# Patient Record
Sex: Female | Born: 1960 | Race: White | Hispanic: No | Marital: Single | Smoking: Never smoker
Health system: Southern US, Community
[De-identification: ages and names within clinical notes are randomized; demographics above are authoritative.]

## PROBLEM LIST (undated history)

## (undated) ENCOUNTER — Inpatient Hospital Stay (HOSPITAL_COMMUNITY): Admission: RE | Payer: Medicare (Managed Care) | Source: Ambulatory Visit | Admitting: Plastic Surgery

## (undated) ENCOUNTER — Encounter (HOSPITAL_COMMUNITY): Admission: RE | Payer: Self-pay | Source: Ambulatory Visit

## (undated) DIAGNOSIS — K769 Liver disease, unspecified: Secondary | ICD-10-CM

## (undated) DIAGNOSIS — E039 Hypothyroidism, unspecified: Secondary | ICD-10-CM

## (undated) DIAGNOSIS — I10 Essential (primary) hypertension: Secondary | ICD-10-CM

## (undated) DIAGNOSIS — D259 Leiomyoma of uterus, unspecified: Secondary | ICD-10-CM

## (undated) DIAGNOSIS — I639 Cerebral infarction, unspecified: Secondary | ICD-10-CM

## (undated) DIAGNOSIS — D1771 Benign lipomatous neoplasm of kidney: Secondary | ICD-10-CM

## (undated) DIAGNOSIS — K589 Irritable bowel syndrome without diarrhea: Secondary | ICD-10-CM

## (undated) DIAGNOSIS — G40909 Epilepsy, unspecified, not intractable, without status epilepticus: Secondary | ICD-10-CM

## (undated) DIAGNOSIS — J45909 Unspecified asthma, uncomplicated: Secondary | ICD-10-CM

## (undated) DIAGNOSIS — G4700 Insomnia, unspecified: Secondary | ICD-10-CM

## (undated) DIAGNOSIS — Z9109 Other allergy status, other than to drugs and biological substances: Secondary | ICD-10-CM

## (undated) HISTORY — PX: OTHER SURGICAL HISTORY: SHX169

## (undated) HISTORY — PX: LIVER BIOPSY: SHX301

## (undated) HISTORY — DX: Irritable bowel syndrome without diarrhea: K58.9

## (undated) HISTORY — DX: Insomnia, unspecified: G47.00

## (undated) HISTORY — DX: Hypothyroidism, unspecified: E03.9

## (undated) HISTORY — DX: Unspecified asthma, uncomplicated: J45.909

## (undated) HISTORY — DX: Epilepsy, unspecified, not intractable, without status epilepticus (CMS HCC): G40.909

## (undated) HISTORY — DX: Benign lipomatous neoplasm of kidney: D17.71

## (undated) HISTORY — PX: HX HAND SURGERY: 2100001299

## (undated) HISTORY — DX: Other allergy status, other than to drugs and biological substances: Z91.09

## (undated) SURGERY — BLEPHAROPLASTY
Anesthesia: Monitor Anesthesia Care | Site: Eye | Laterality: Bilateral

---

## 1997-09-24 ENCOUNTER — Observation Stay (HOSPITAL_COMMUNITY): Payer: Self-pay | Admitting: EXTERNAL

## 1997-10-19 ENCOUNTER — Ambulatory Visit (HOSPITAL_COMMUNITY): Payer: Self-pay | Admitting: Family Medicine

## 1997-12-14 ENCOUNTER — Emergency Department (HOSPITAL_COMMUNITY): Admission: EM | Admit: 1997-12-14 | Discharge: 1997-12-14 | Payer: Self-pay | Admitting: Emergency Medicine

## 1998-01-15 ENCOUNTER — Emergency Department (HOSPITAL_COMMUNITY): Admission: EM | Admit: 1998-01-15 | Discharge: 1998-01-15 | Payer: Self-pay | Admitting: Emergency Medicine

## 1998-05-08 ENCOUNTER — Emergency Department (HOSPITAL_COMMUNITY): Admission: EM | Admit: 1998-05-08 | Discharge: 1998-05-08 | Payer: Self-pay | Admitting: Emergency Medicine

## 1998-05-29 ENCOUNTER — Emergency Department (HOSPITAL_COMMUNITY): Admission: EM | Admit: 1998-05-29 | Discharge: 1998-05-29 | Payer: Self-pay | Admitting: Emergency Medicine

## 1998-06-02 ENCOUNTER — Ambulatory Visit (HOSPITAL_BASED_OUTPATIENT_CLINIC_OR_DEPARTMENT_OTHER): Admission: RE | Admit: 1998-06-02 | Discharge: 1998-06-02 | Payer: Self-pay | Admitting: Orthopedic Surgery

## 1998-08-27 ENCOUNTER — Emergency Department (HOSPITAL_COMMUNITY): Admission: EM | Admit: 1998-08-27 | Discharge: 1998-08-27 | Payer: Self-pay | Admitting: Emergency Medicine

## 1998-09-08 ENCOUNTER — Encounter: Payer: Self-pay | Admitting: Emergency Medicine

## 1998-09-08 ENCOUNTER — Emergency Department (HOSPITAL_COMMUNITY): Admission: EM | Admit: 1998-09-08 | Discharge: 1998-09-08 | Payer: Self-pay | Admitting: Emergency Medicine

## 1998-11-05 ENCOUNTER — Emergency Department (HOSPITAL_COMMUNITY): Admission: EM | Admit: 1998-11-05 | Discharge: 1998-11-05 | Payer: Self-pay | Admitting: Emergency Medicine

## 1998-12-06 ENCOUNTER — Observation Stay (HOSPITAL_COMMUNITY): Admission: EM | Admit: 1998-12-06 | Discharge: 1998-12-06 | Payer: Self-pay | Admitting: Emergency Medicine

## 1999-03-05 ENCOUNTER — Emergency Department (HOSPITAL_COMMUNITY): Admission: EM | Admit: 1999-03-05 | Discharge: 1999-03-06 | Payer: Self-pay

## 1999-03-18 ENCOUNTER — Other Ambulatory Visit: Admission: RE | Admit: 1999-03-18 | Discharge: 1999-03-18 | Payer: Self-pay | Admitting: Obstetrics and Gynecology

## 2001-08-05 ENCOUNTER — Inpatient Hospital Stay (HOSPITAL_COMMUNITY): Admission: EM | Admit: 2001-08-05 | Discharge: 2001-08-08 | Payer: Self-pay | Admitting: Emergency Medicine

## 2001-08-05 ENCOUNTER — Encounter: Payer: Self-pay | Admitting: Internal Medicine

## 2001-08-07 ENCOUNTER — Encounter: Payer: Self-pay | Admitting: Internal Medicine

## 2001-10-03 ENCOUNTER — Emergency Department (HOSPITAL_COMMUNITY): Admission: EM | Admit: 2001-10-03 | Discharge: 2001-10-04 | Payer: Self-pay | Admitting: Emergency Medicine

## 2002-02-11 ENCOUNTER — Encounter: Payer: Self-pay | Admitting: Emergency Medicine

## 2002-02-11 ENCOUNTER — Inpatient Hospital Stay (HOSPITAL_COMMUNITY): Admission: EM | Admit: 2002-02-11 | Discharge: 2002-02-12 | Payer: Self-pay | Admitting: Emergency Medicine

## 2002-02-12 ENCOUNTER — Encounter: Payer: Self-pay | Admitting: Internal Medicine

## 2002-08-26 ENCOUNTER — Emergency Department (HOSPITAL_COMMUNITY): Admission: EM | Admit: 2002-08-26 | Discharge: 2002-08-26 | Payer: Self-pay | Admitting: Emergency Medicine

## 2003-05-19 ENCOUNTER — Emergency Department (HOSPITAL_COMMUNITY): Admission: EM | Admit: 2003-05-19 | Discharge: 2003-05-19 | Payer: Self-pay | Admitting: Emergency Medicine

## 2003-11-24 ENCOUNTER — Emergency Department (HOSPITAL_COMMUNITY): Admission: EM | Admit: 2003-11-24 | Discharge: 2003-11-24 | Payer: Self-pay | Admitting: Emergency Medicine

## 2003-11-24 IMAGING — CR DG SHOULDER 2+V*L*
4 series · 4 of 4 positions shown · non-contrast
Comparison: none

CLINICAL DATA: The patient fell in the shower and complains of left shoulder pain.
 LEFT SHOULDER, THREE VIEWS ? [DATE] 
 On two views, there appears to be a tiny avulsion from the lateral aspect of the acromion.  This is an unusual place for an avulsion fracture, but I think it is a real finding.  There is no other abnormality.

[view not recorded (1 of 4)]
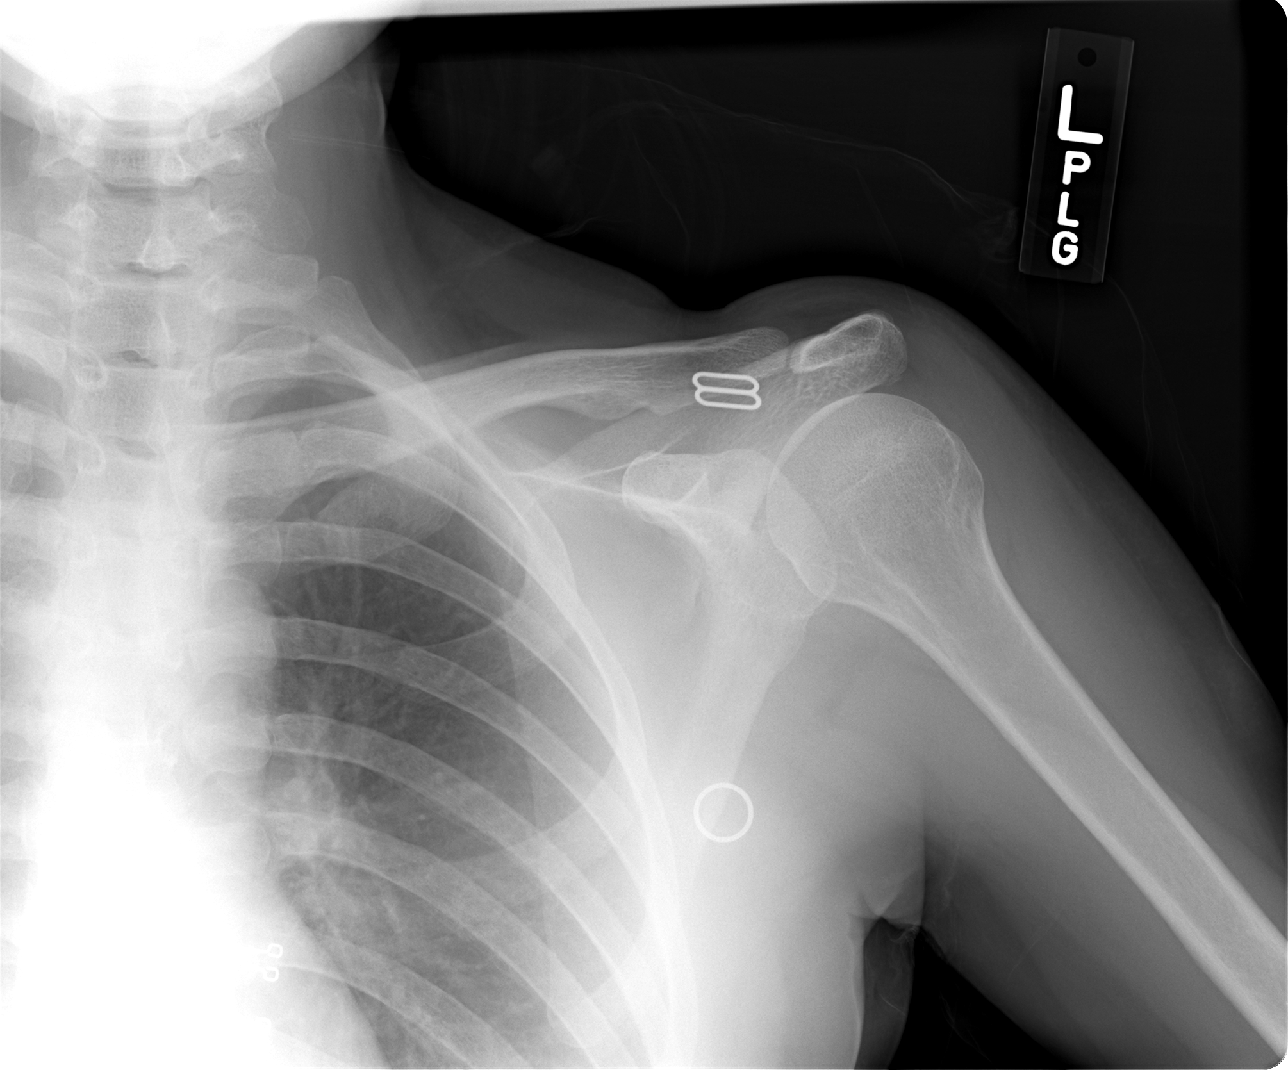

[view not recorded (2 of 4)]
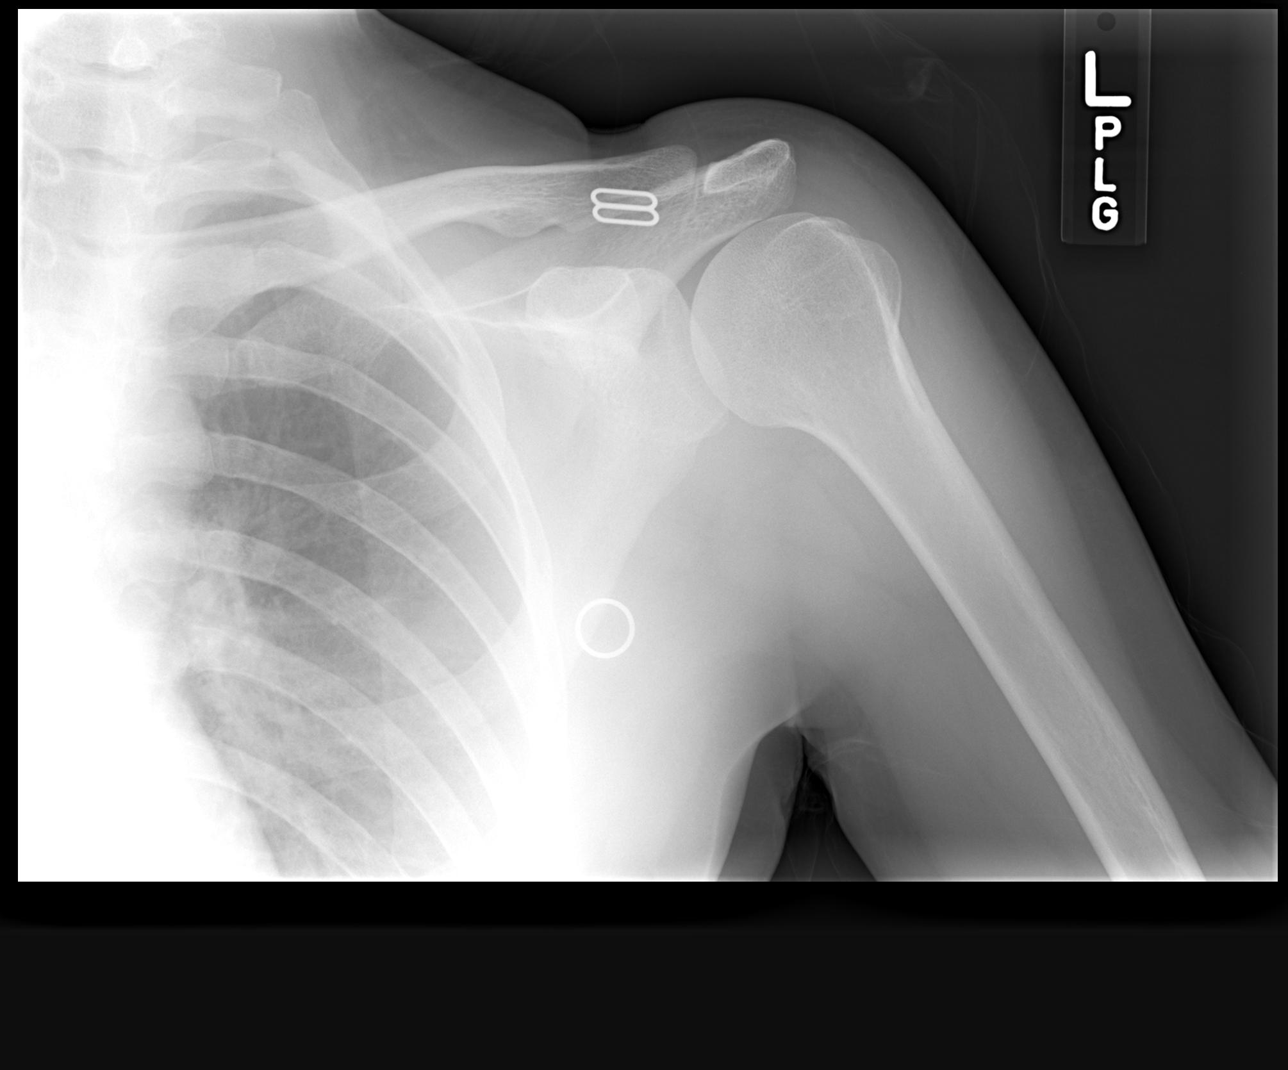

[view not recorded (3 of 4)]
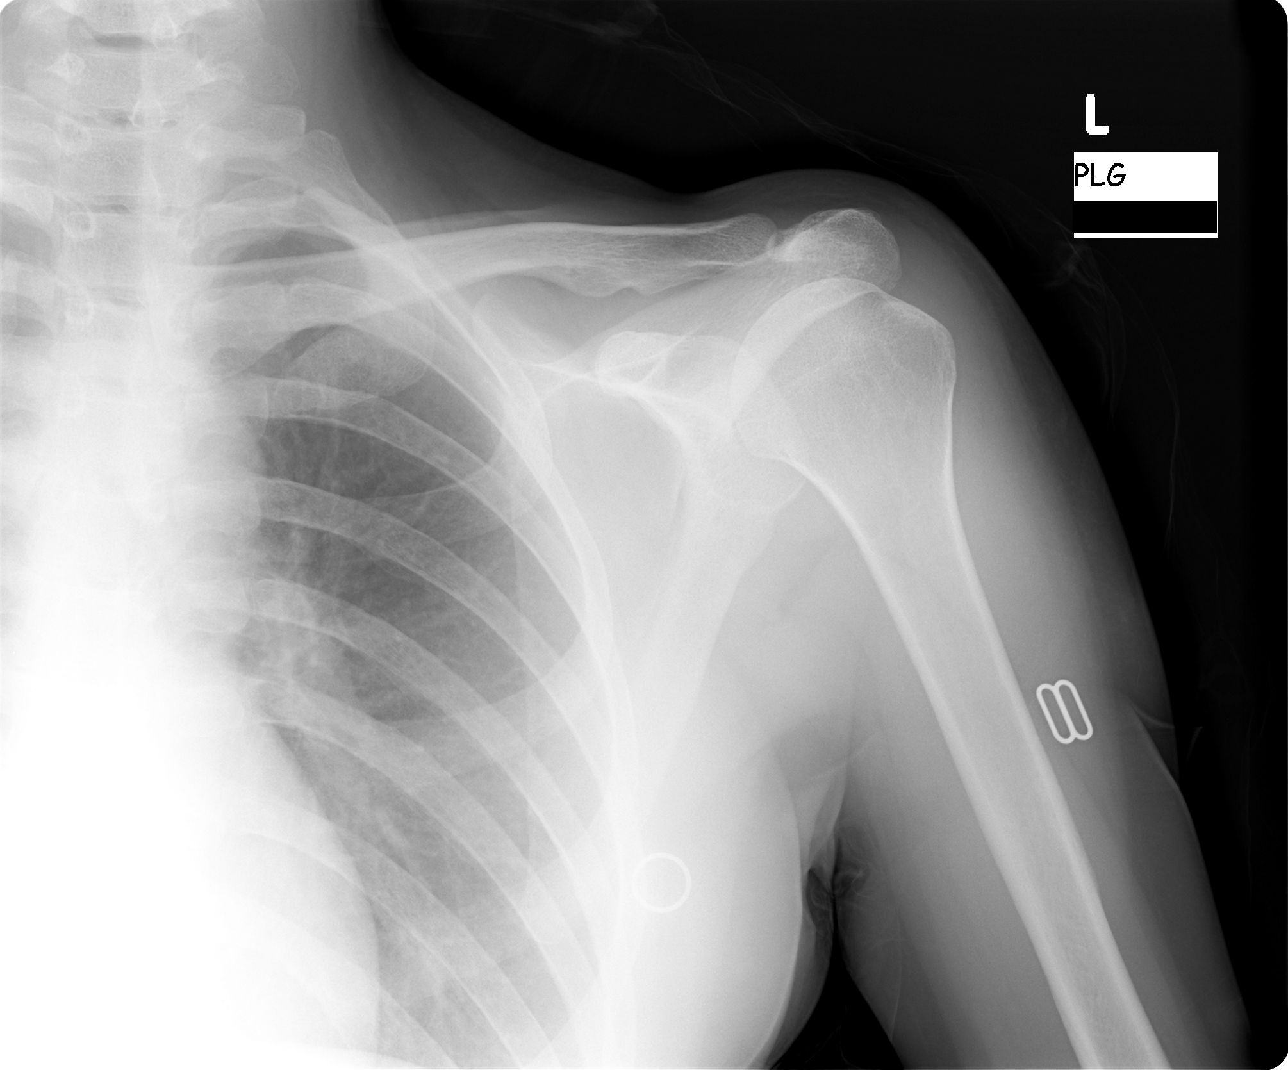

[view not recorded (4 of 4)]
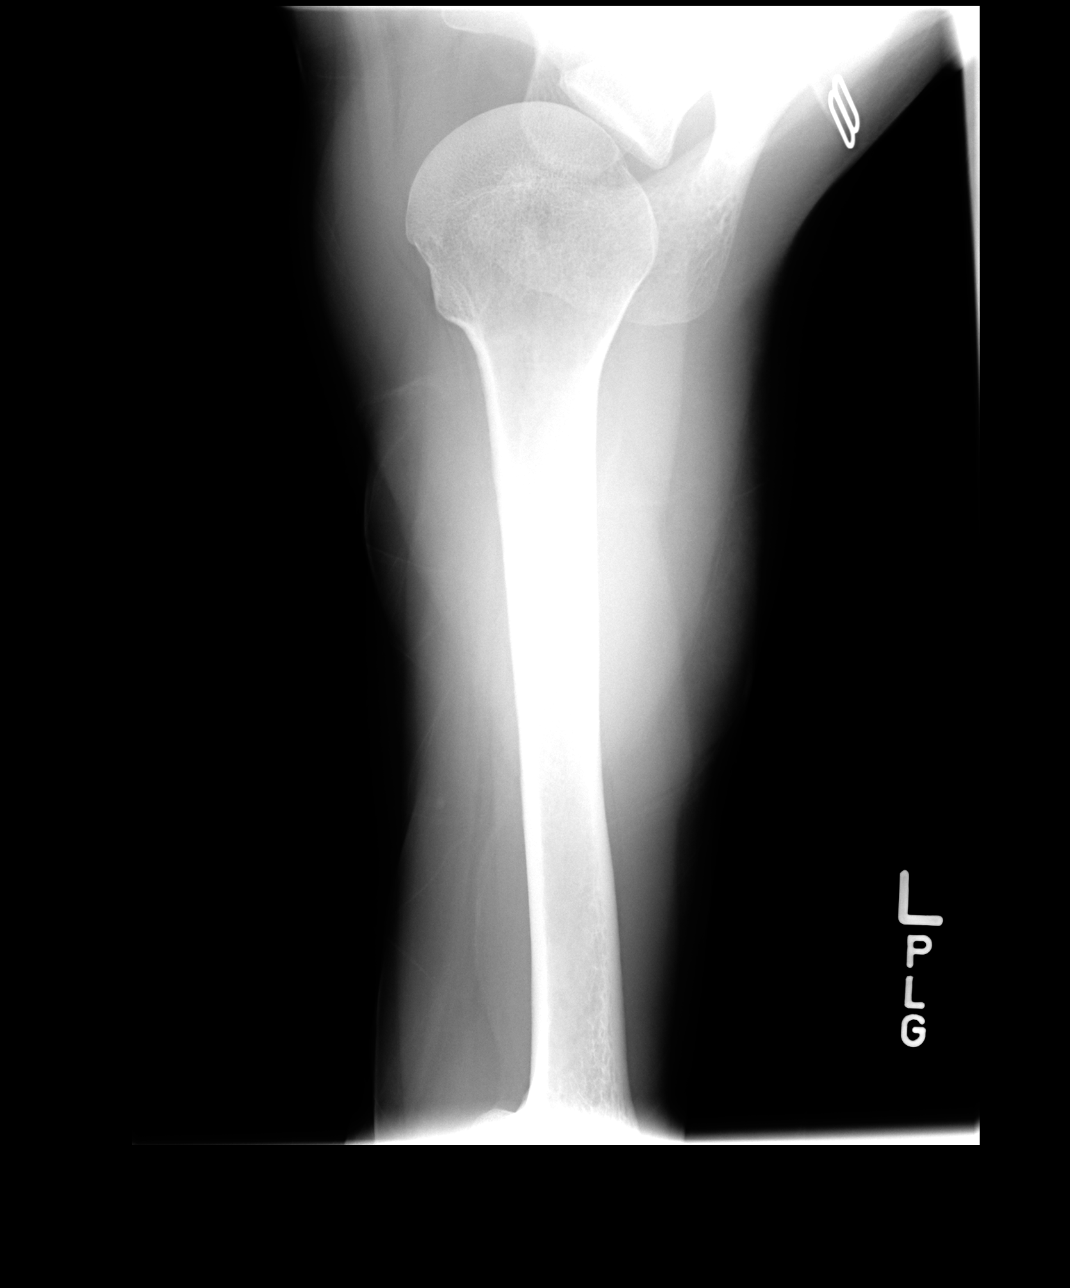

[4 of 4 positions shown; findings below may reference images not displayed]

IMPRESSION: Tiny avulsion from the lateral aspect of the acromion.

## 2003-11-24 IMAGING — CR DG FINGER THUMB 2+V*L*
1 series · 1 of 1 positions shown · non-contrast
Comparison: none

CLINICAL DATA: Pain after a fall.  
 LEFT THUMB (THREE VIEWS) [DATE] 
 There is no fracture or other acute abnormality.  
 IMPRESSION
 Normal thumb.

[view not recorded]
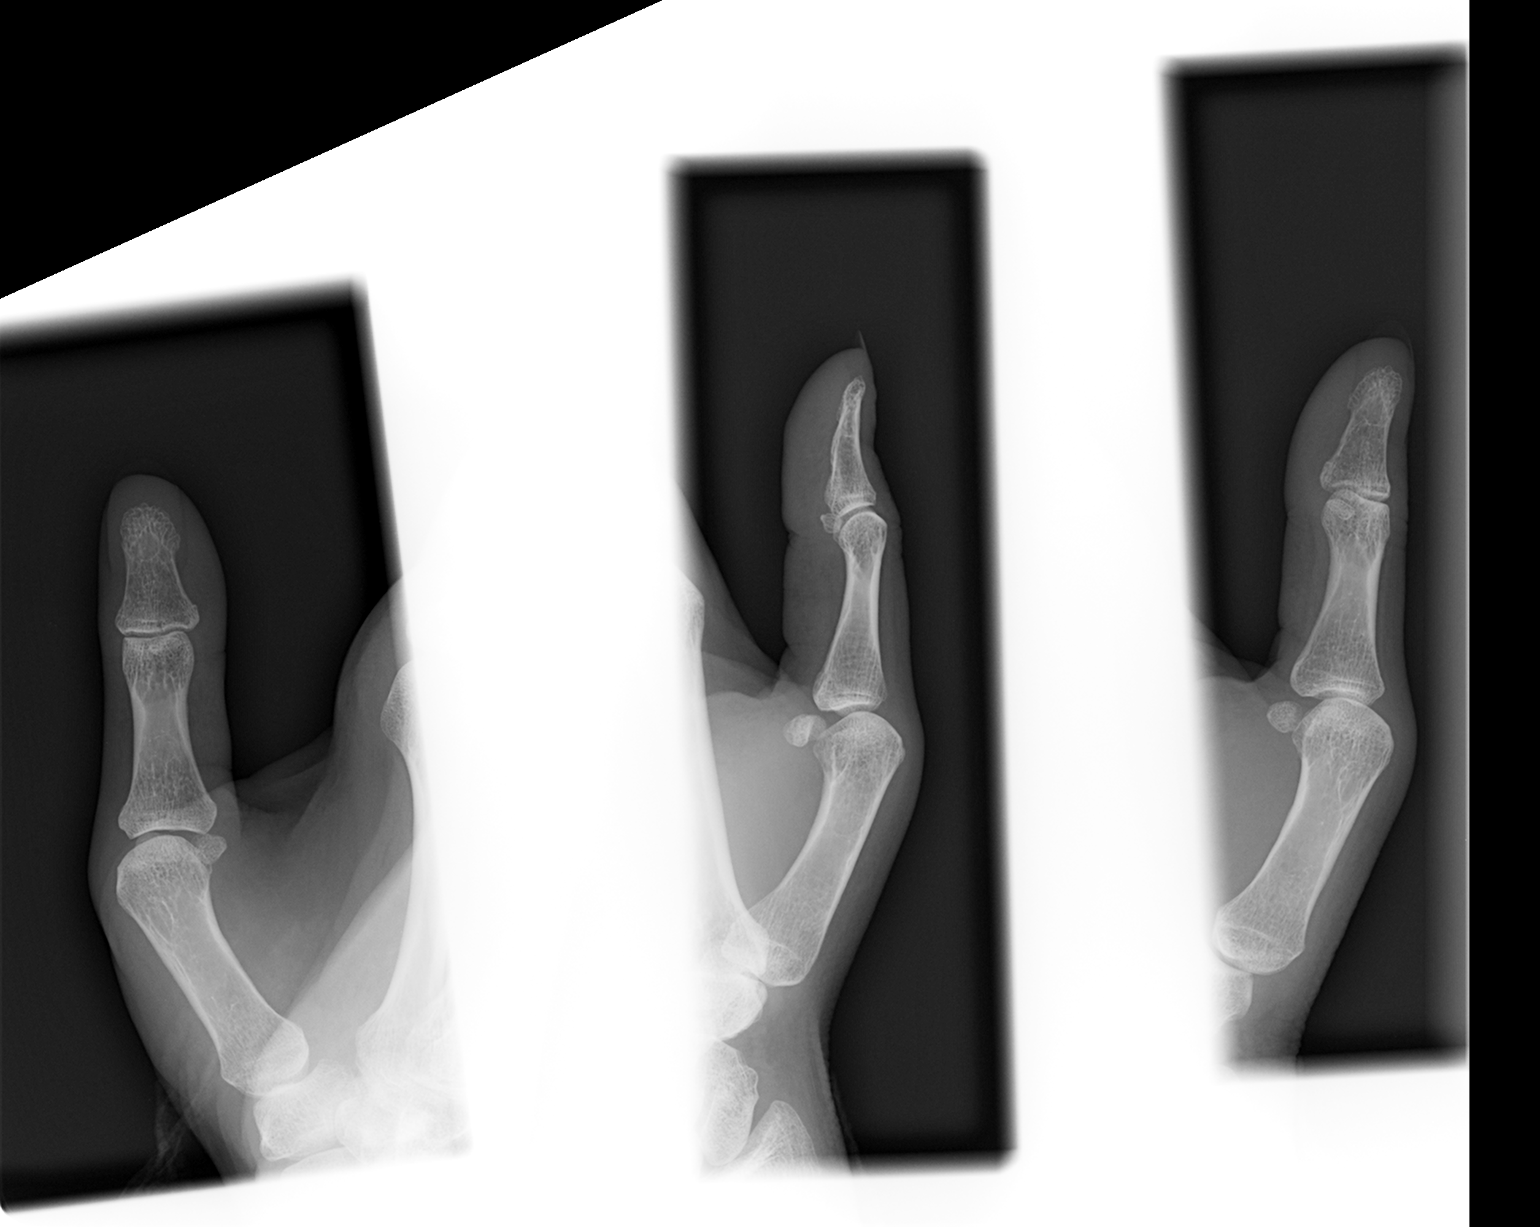

[1 of 1 positions shown; findings below may reference images not displayed]

## 2004-06-29 ENCOUNTER — Emergency Department (HOSPITAL_COMMUNITY): Admission: EM | Admit: 2004-06-29 | Discharge: 2004-06-29 | Payer: Self-pay | Admitting: Emergency Medicine
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
## 2004-09-17 ENCOUNTER — Emergency Department (HOSPITAL_COMMUNITY): Admission: EM | Admit: 2004-09-17 | Discharge: 2004-09-17 | Payer: Self-pay | Admitting: Family Medicine

## 2005-01-28 ENCOUNTER — Inpatient Hospital Stay (HOSPITAL_COMMUNITY): Admission: EM | Admit: 2005-01-28 | Discharge: 2005-01-31 | Payer: Self-pay | Admitting: Emergency Medicine

## 2005-01-28 IMAGING — CR DG CHEST 2V
2 series · 2 of 2 positions shown · non-contrast
Comparison: none

CLINICAL DATA: Chest pain. 
 CHEST - 2 VIEW: 
 No comparison films available. 
 Cardiomediastinal silhouette is unremarkable.  The lungs are clear.  No evidence of pleural effusion or pneumothorax.  The upper abdomen and bony thorax are unremarkable.

[w chest lat]
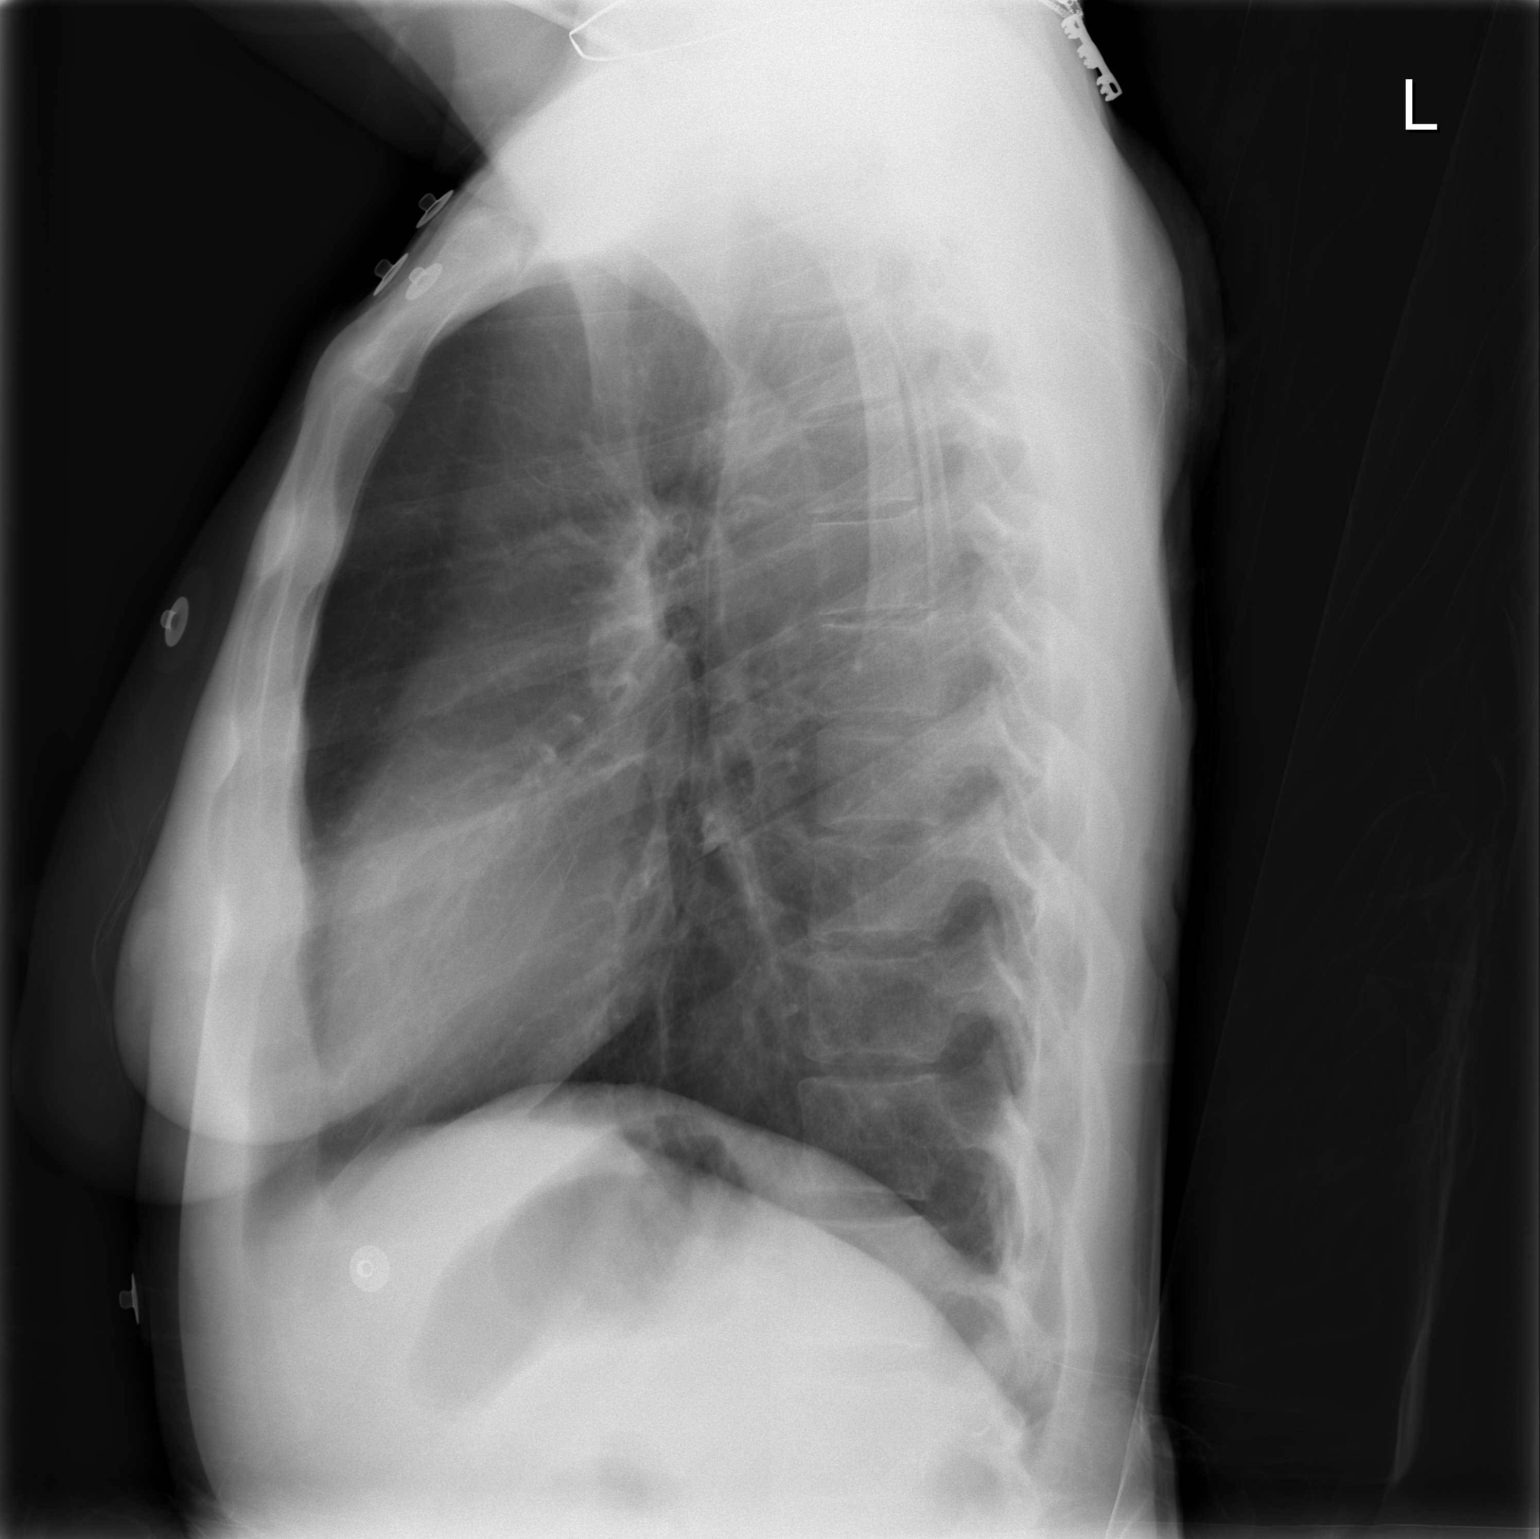

[view not recorded]
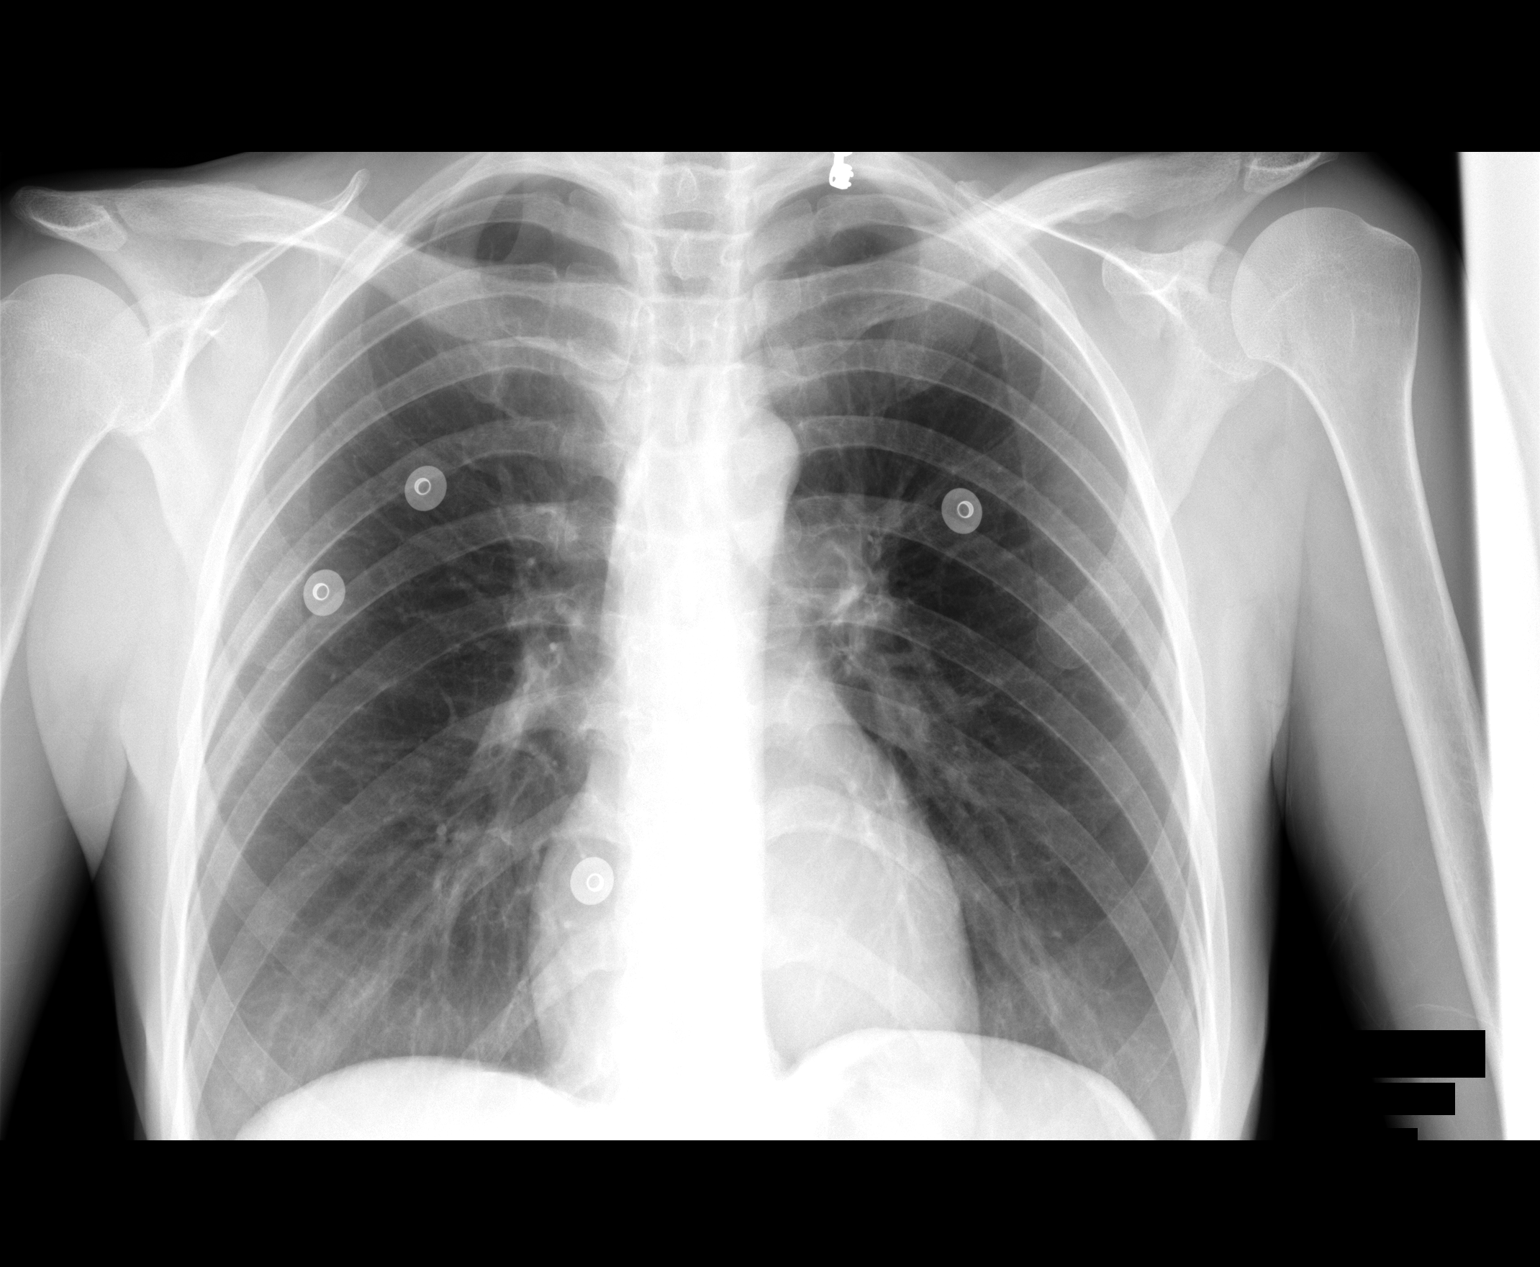

[2 of 2 positions shown; findings below may reference images not displayed]

IMPRESSION: No evidence of active cardiopulmonary disease.

## 2005-01-28 IMAGING — CT CT HEAD W/O CM
1 series · 16 of 30 positions shown, 20 images · IV contrast (agent unspecified)
Comparison: none

CLINICAL DATA: Seizure.
 HEAD CT WITHOUT CONTRAST:
TECHNIQUE: 5 mm collimated images were obtained from the base of the skull through the vertex according to standard protocol without contrast.
 No comparison films available.

[Series 2: head_seq 4.5 h42s st · axial · 0.43mm/px · z∈[-664,-538]mm · 16 of 32 slices shown, 20 images]
[im 2/32  brain]
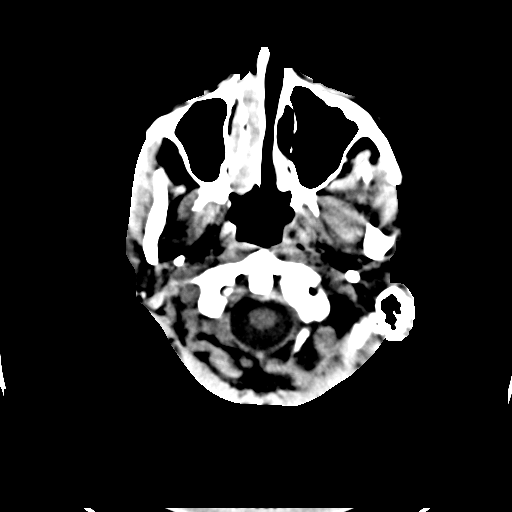
[im 2/32  bone]
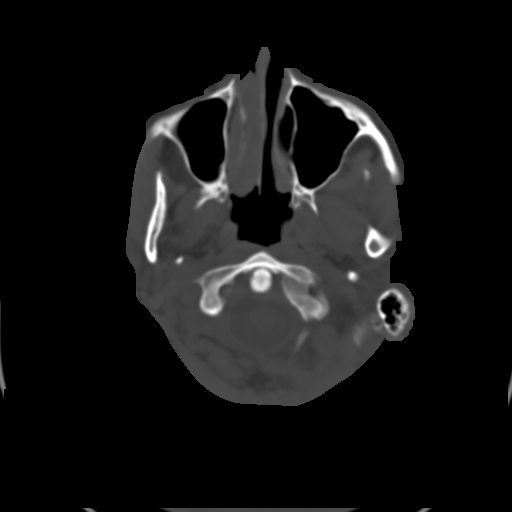
[im 4/32  brain]
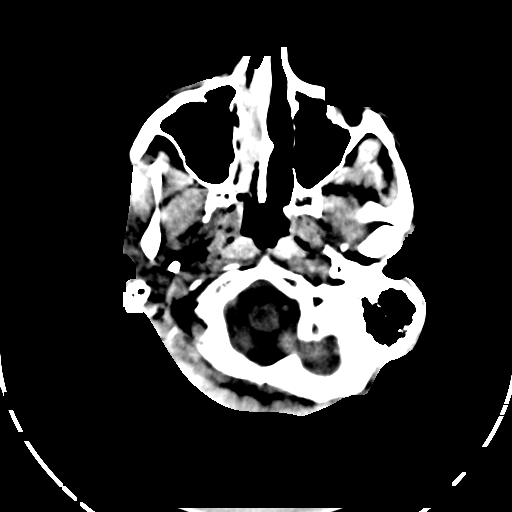
[im 6/32  brain]
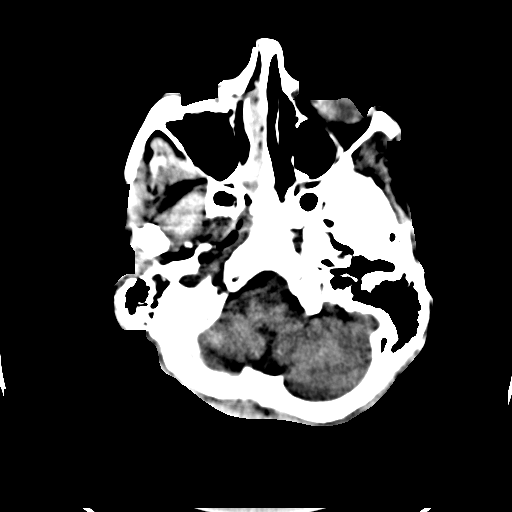
[im 8/32  brain]
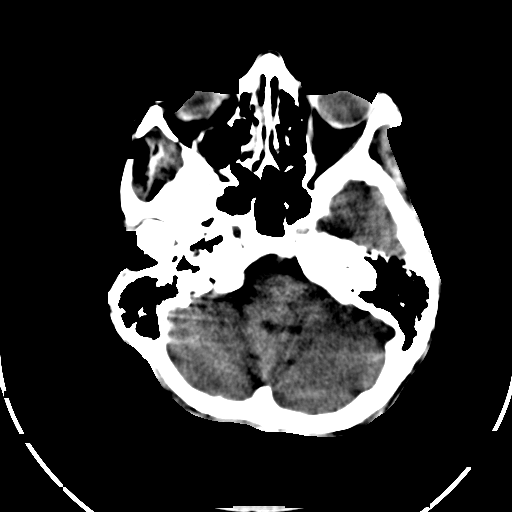
[im 9/32  brain]
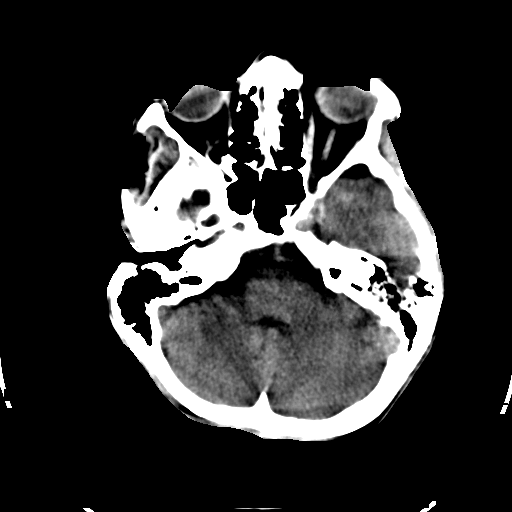
[im 9/32  bone]
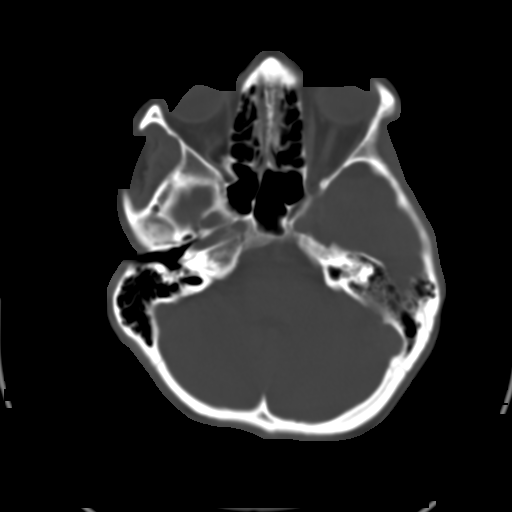
[im 11/32  brain]
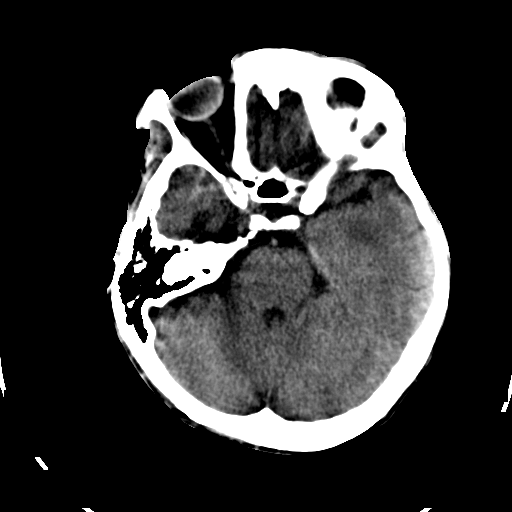
[im 13/32  brain]
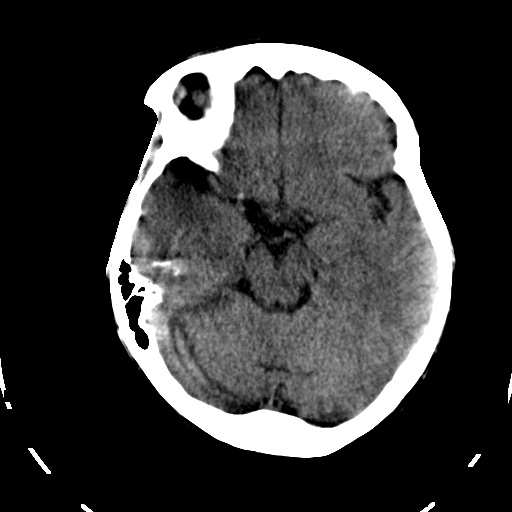
[im 15/32  brain]
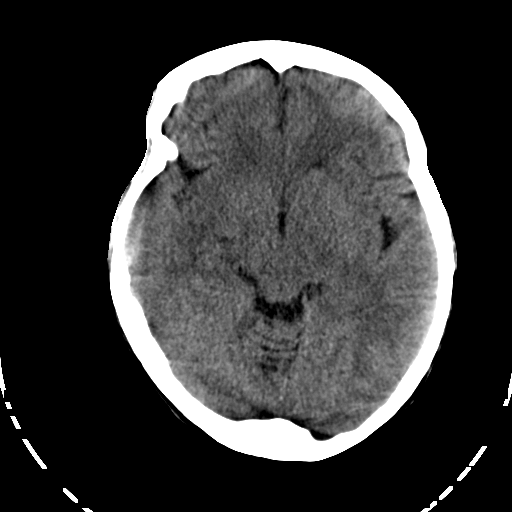
[im 17/32  brain]
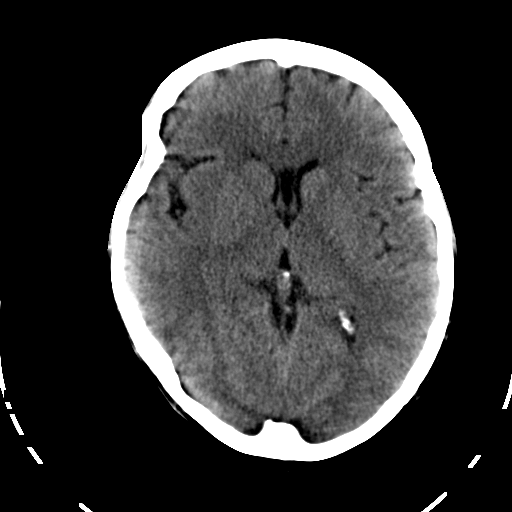
[im 17/32  bone]
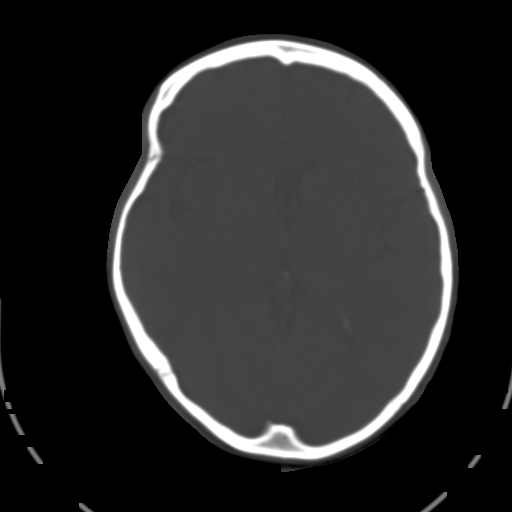
[im 19/32  brain]
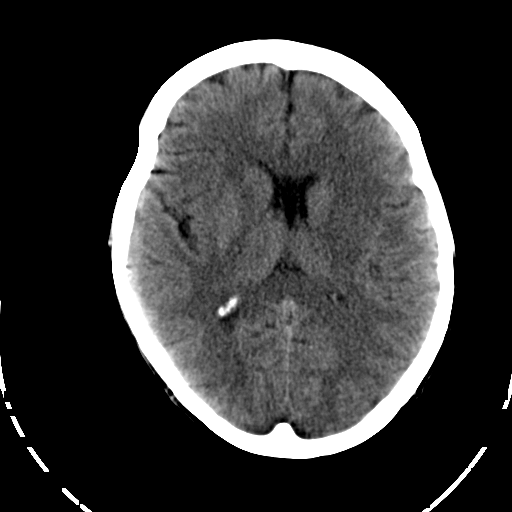
[im 21/32  brain]
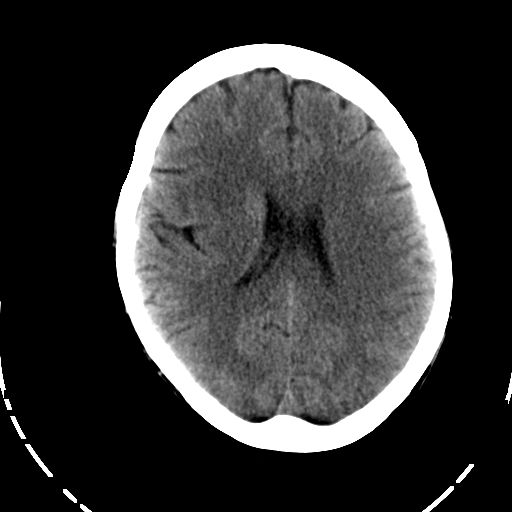
[im 23/32  brain]
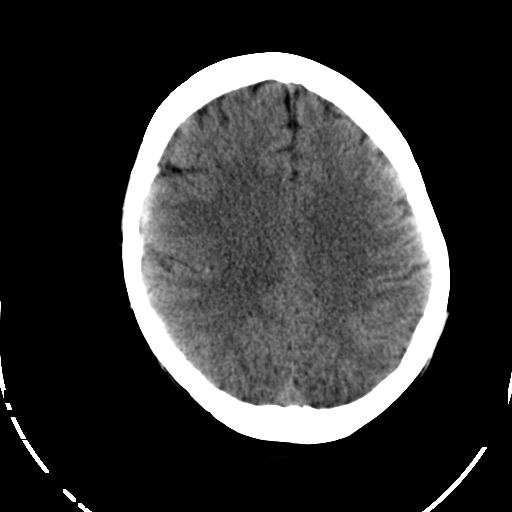
[im 24/32  brain]
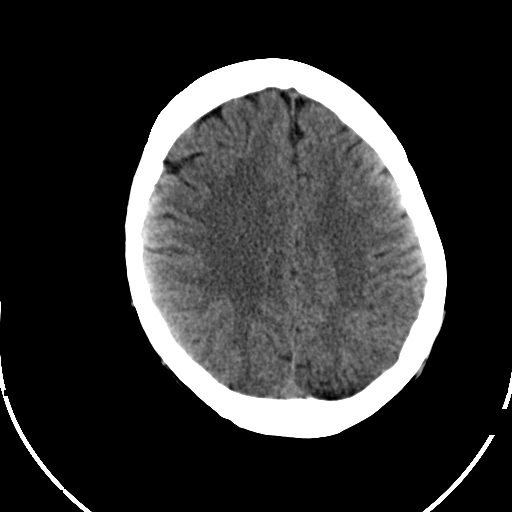
[im 24/32  bone]
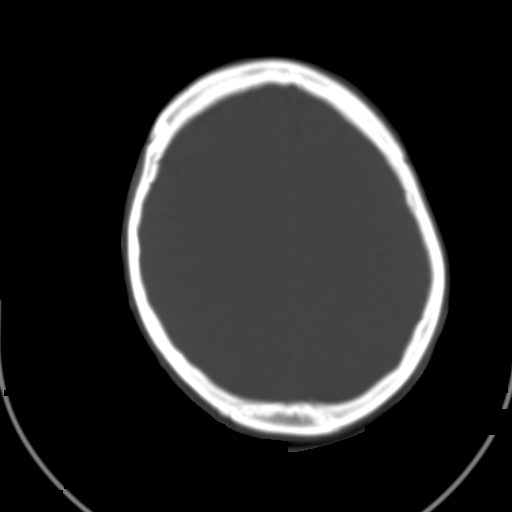
[im 26/32  brain]
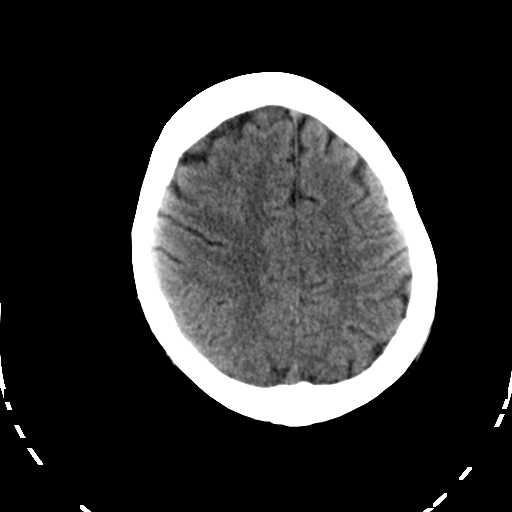
[im 28/32  brain]
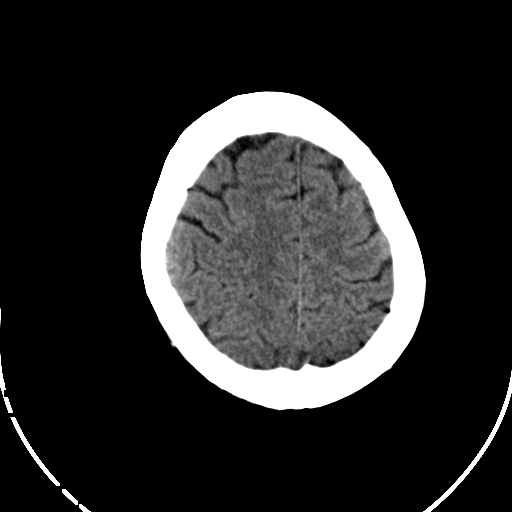
[im 30/32  brain]
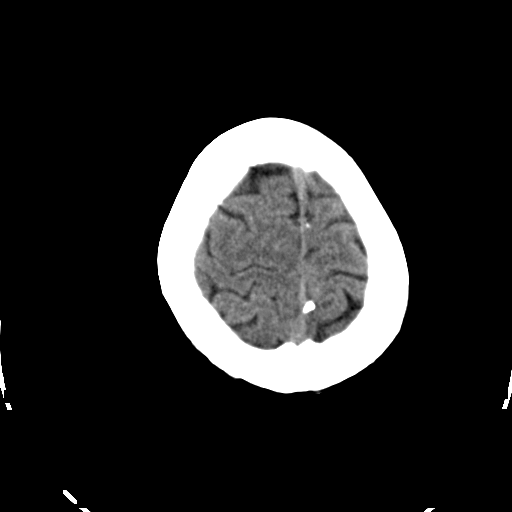

[16 of 30 positions shown; findings below may reference images not displayed]

FINDINGS: There is no evidence of acute intracranial abnormality including mass or mass effect, hydrocephalus, extra-axial fluid collection, midline shift, hemorrhage, or infarct.   Acute infarct may be missed by CT for 24-48 hours.   Cavum septum pellucidum is noted.  The visualized bony calvarium and paranasal sinuses are unremarkable.   Consider MRI for further evaluation as indicated.
IMPRESSION: No evidence of acute intracranial abnormality.

## 2005-08-01 ENCOUNTER — Emergency Department (HOSPITAL_COMMUNITY): Admission: EM | Admit: 2005-08-01 | Discharge: 2005-08-02 | Payer: Self-pay | Admitting: Emergency Medicine

## 2005-08-01 IMAGING — CR DG FOOT COMPLETE 3+V*R*
3 series · 3 of 3 positions shown · non-contrast
Comparison: none

CLINICAL DATA: The patient fell on [DATE] and complains of pain at the base of the fifth metatarsal and at the first metatarsal phalangeal joint.   
 RIGHT FOOT ? 3 VIEW:

[t foot ap right]
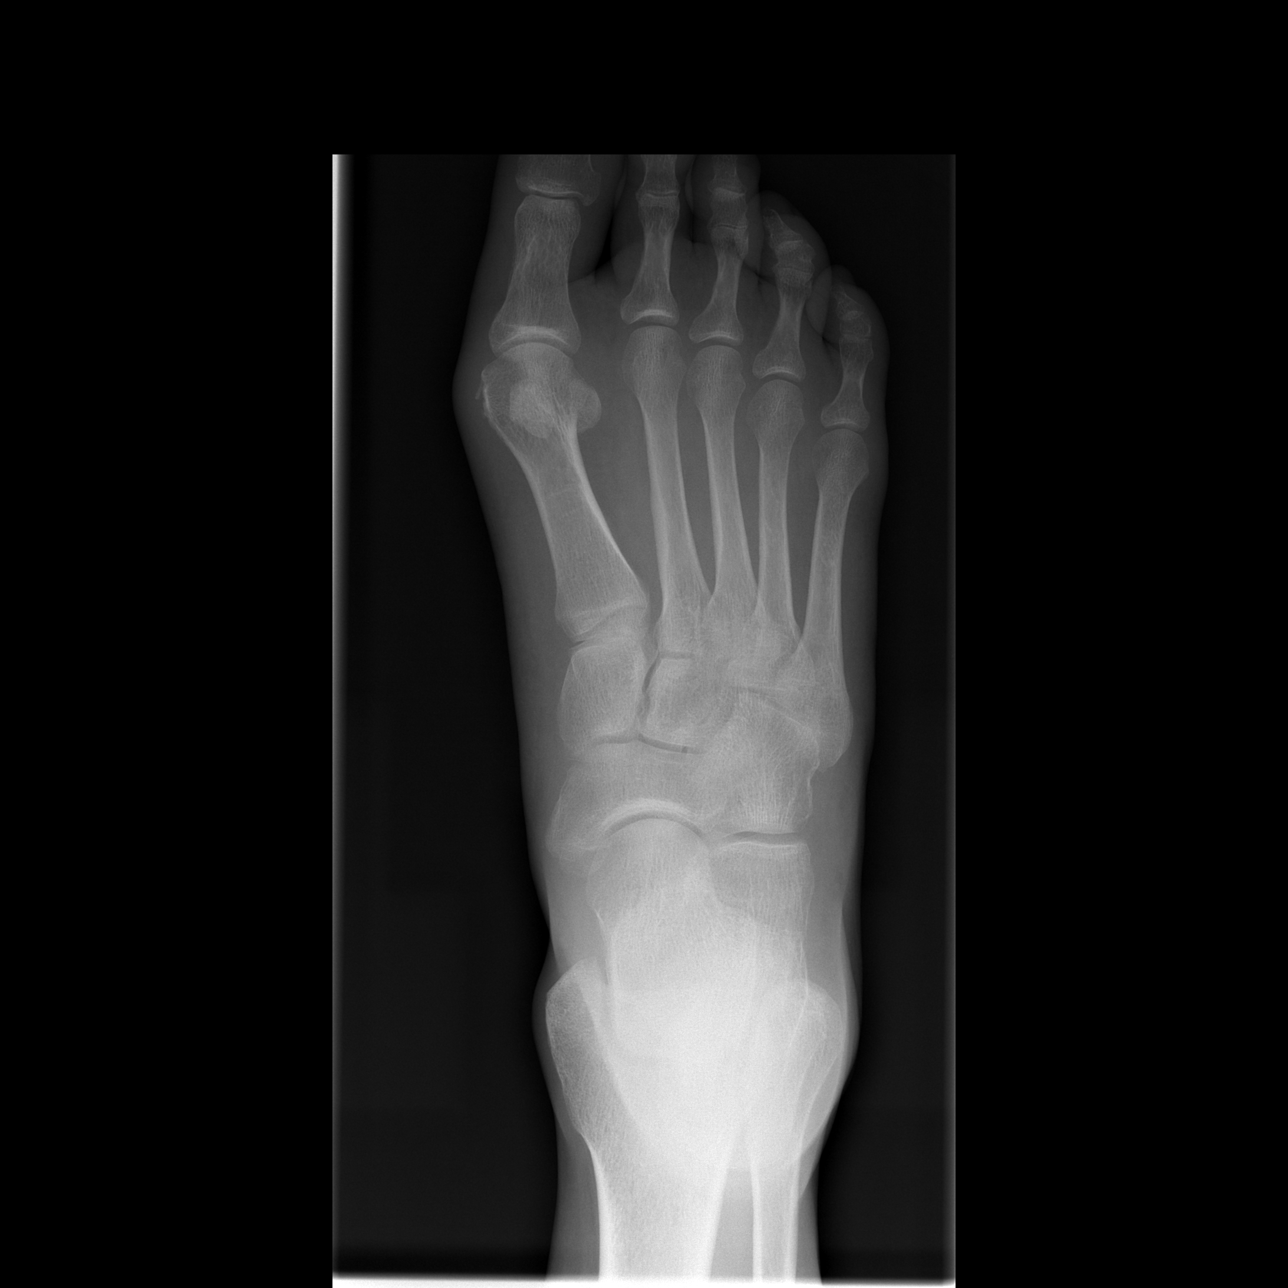

[t foot oblique right]
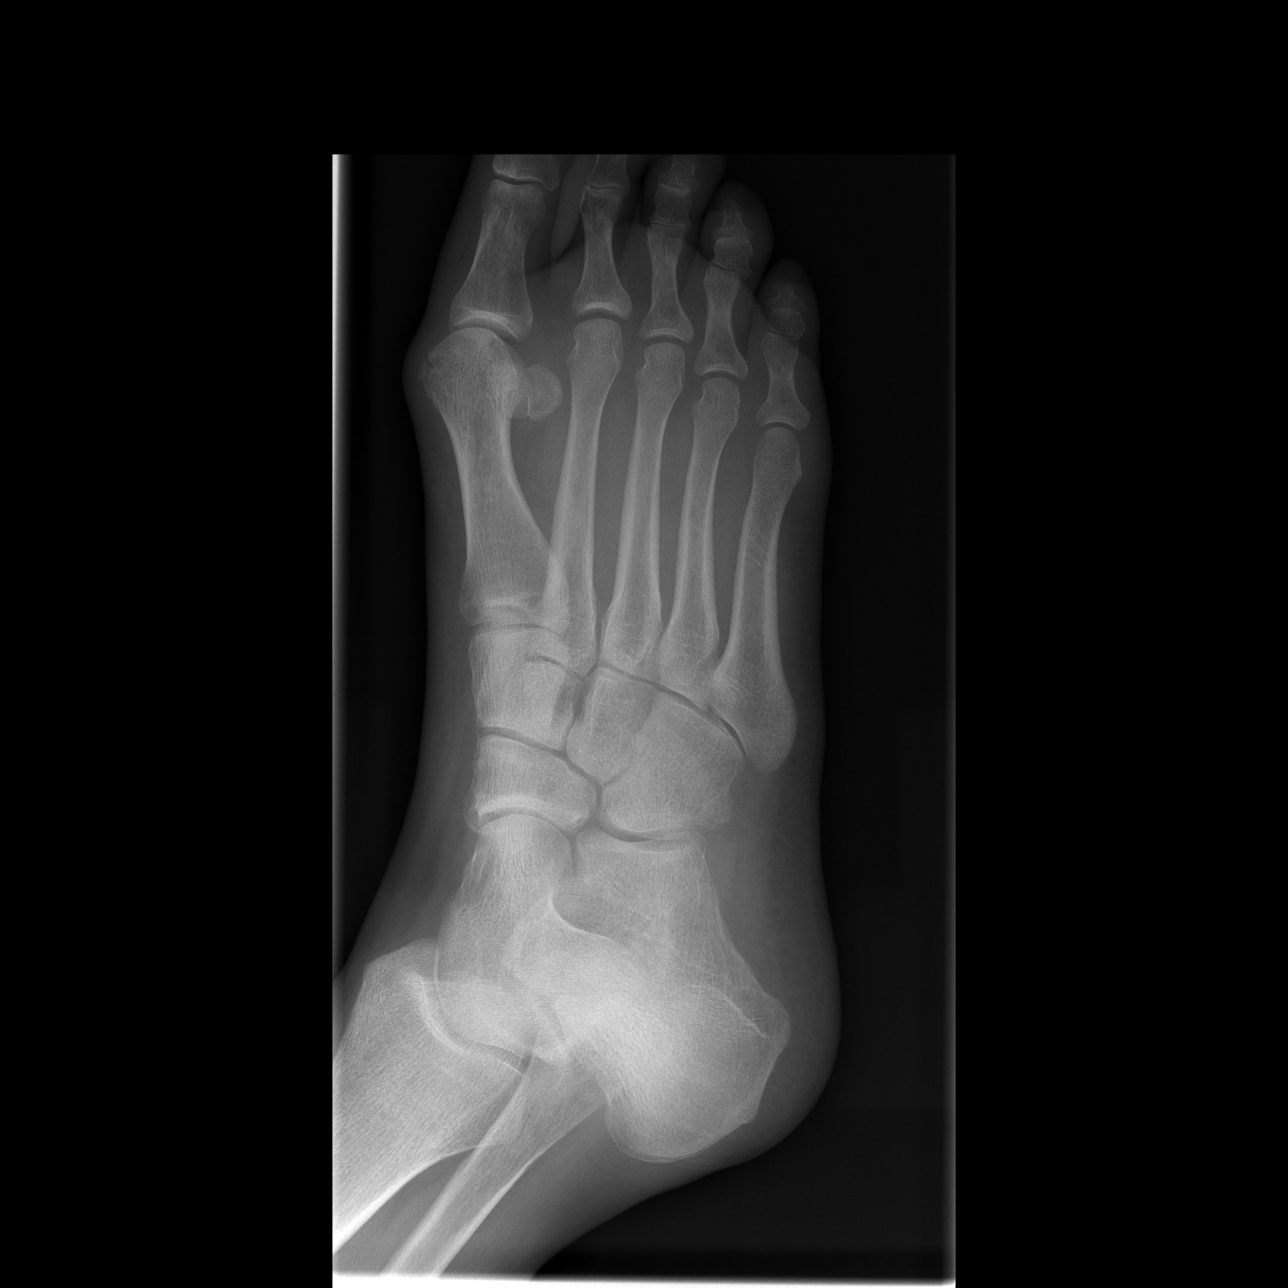

[t foot lat right]
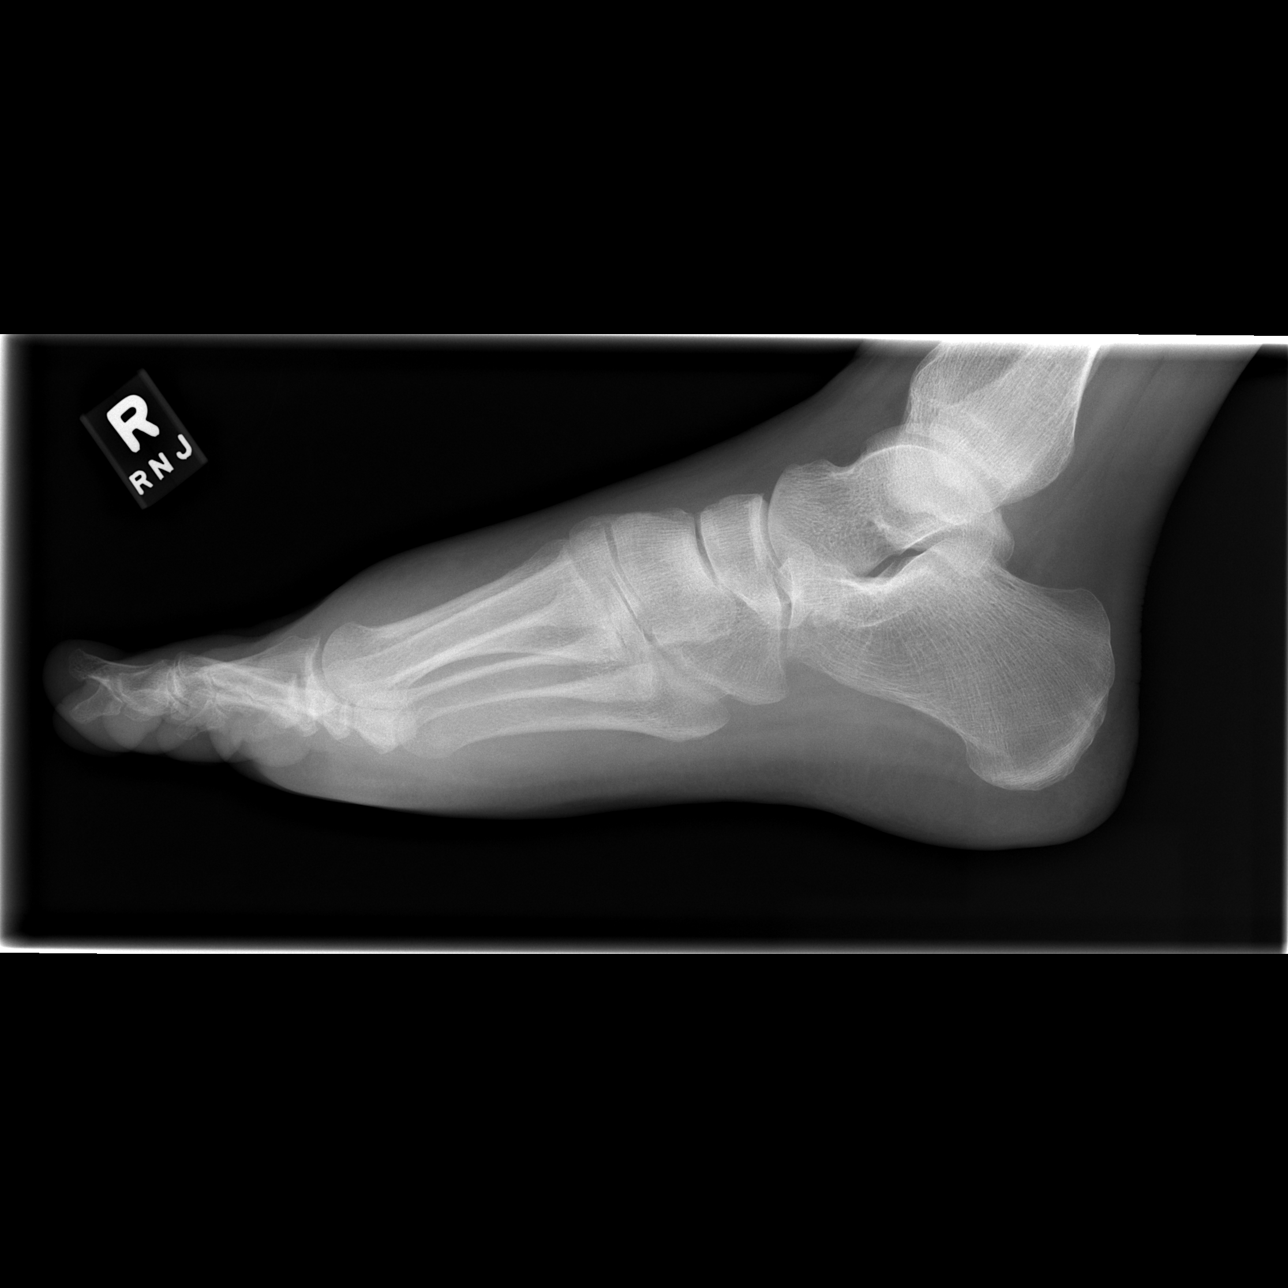

[3 of 3 positions shown; findings below may reference images not displayed]

FINDINGS: There is soft tissue swelling at the level of the metatarsal phalangeal joints.  There is a bunion formation with some irregularity of the dorsal aspect of the head of the first metatarsal.  Has the patient had prior surgery?
 There are some mild degenerative changes of the first metatarsal phalangeal joint.  No other abnormality.
IMPRESSION: No fractures.  Bunion formation with degenerative changes of the first metatarsal phalangeal joint.

## 2005-10-26 ENCOUNTER — Emergency Department (HOSPITAL_COMMUNITY): Admission: EM | Admit: 2005-10-26 | Discharge: 2005-10-27 | Payer: Self-pay | Admitting: Emergency Medicine

## 2006-03-02 ENCOUNTER — Emergency Department (HOSPITAL_COMMUNITY): Admission: EM | Admit: 2006-03-02 | Discharge: 2006-03-02 | Payer: Self-pay | Admitting: Emergency Medicine

## 2006-04-02 ENCOUNTER — Emergency Department (HOSPITAL_COMMUNITY): Admission: EM | Admit: 2006-04-02 | Discharge: 2006-04-03 | Payer: Self-pay | Admitting: Emergency Medicine

## 2006-05-14 ENCOUNTER — Emergency Department (HOSPITAL_COMMUNITY): Admission: EM | Admit: 2006-05-14 | Discharge: 2006-05-14 | Payer: Self-pay | Admitting: Emergency Medicine

## 2006-05-14 IMAGING — CR DG CHEST 2V
2 series · 2 of 2 positions shown · non-contrast
Comparison: [DATE].

CLINICAL DATA: Productive cough for three weeks.  Shortness of breath.  History of asthma.
 CHEST - 2 VIEW ? [DATE]:

[view not recorded (1 of 2)]
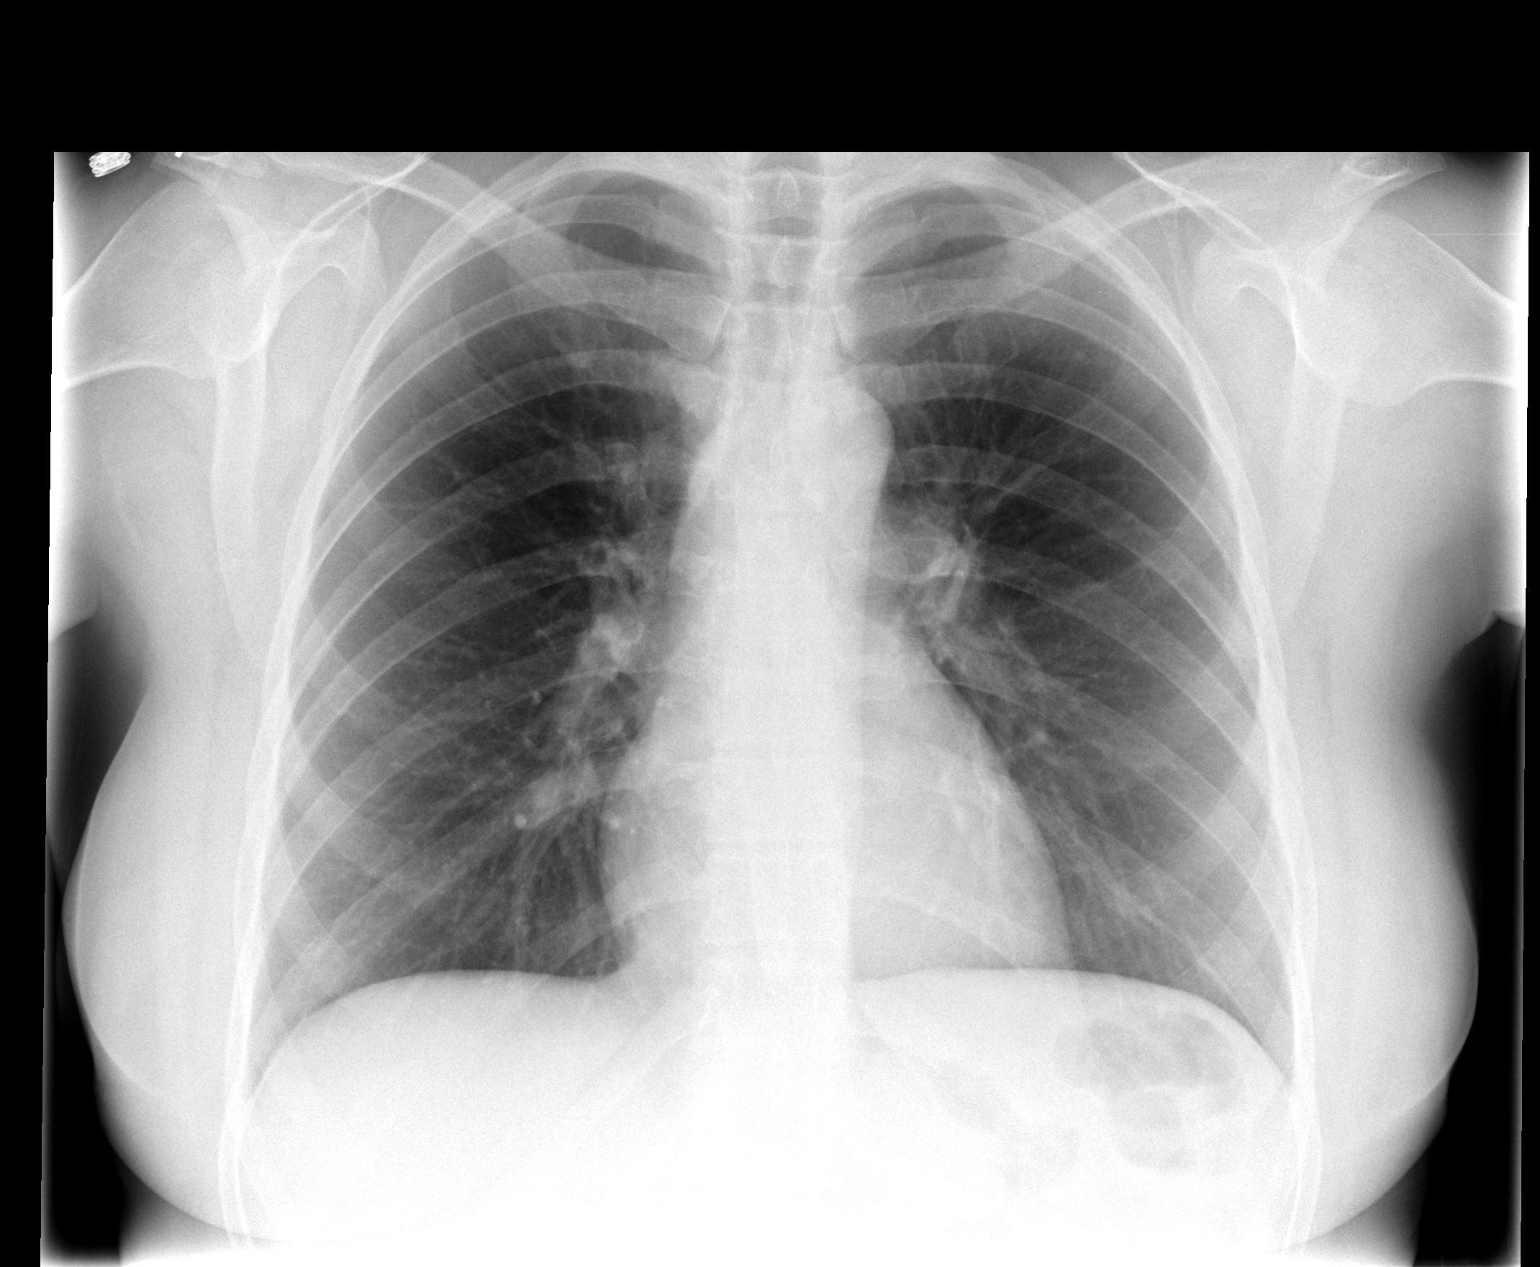

[view not recorded (2 of 2)]
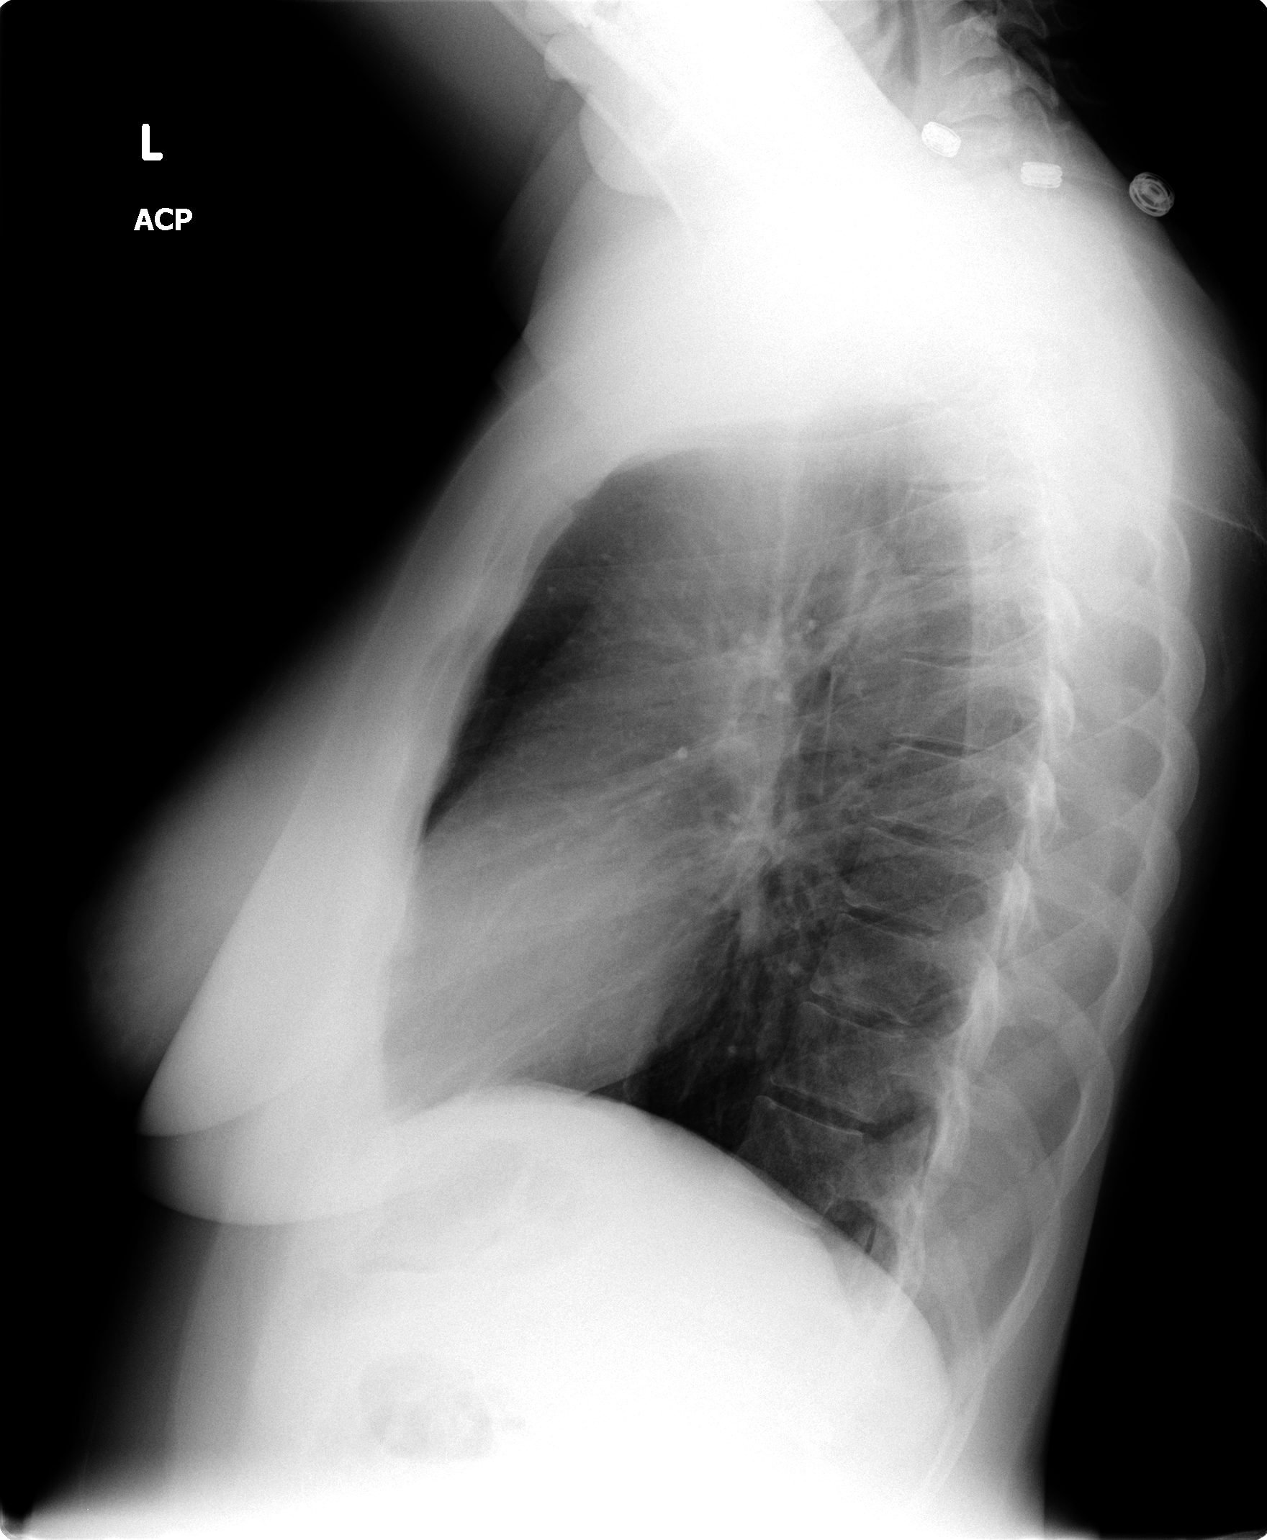

[2 of 2 positions shown; findings below may reference images not displayed]

FINDINGS: Midline trachea. The heart size is normal.  The mediastinal contours are within normal limits.  The costophrenic angles are sharp.   The lungs are clear.
IMPRESSION: No acute cardiopulmonary disease.

## 2006-12-08 ENCOUNTER — Inpatient Hospital Stay (HOSPITAL_COMMUNITY): Admission: EM | Admit: 2006-12-08 | Discharge: 2006-12-11 | Payer: Self-pay | Admitting: Family Medicine

## 2006-12-08 ENCOUNTER — Ambulatory Visit: Payer: Self-pay | Admitting: Internal Medicine

## 2006-12-09 IMAGING — CT CT HEAD W/O CM
1 series · 16 of 30 positions shown, 20 images · non-contrast
Comparison: none

CLINICAL DATA: Seizure, headache

[Series 2: head routine 4.8 h47s · axial · 0.39mm/px · z∈[-158,-27]mm · 16 of 30 slices shown, 20 images]
[im 2/30  brain]
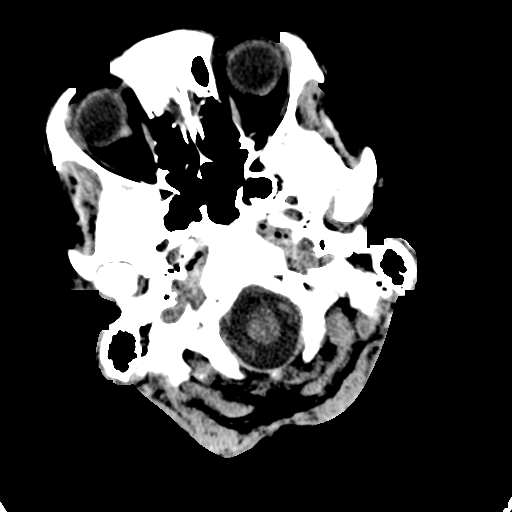
[im 2/30  bone]
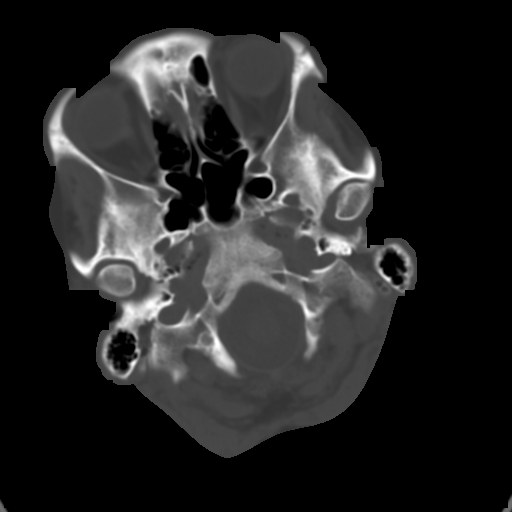
[im 4/30  brain]
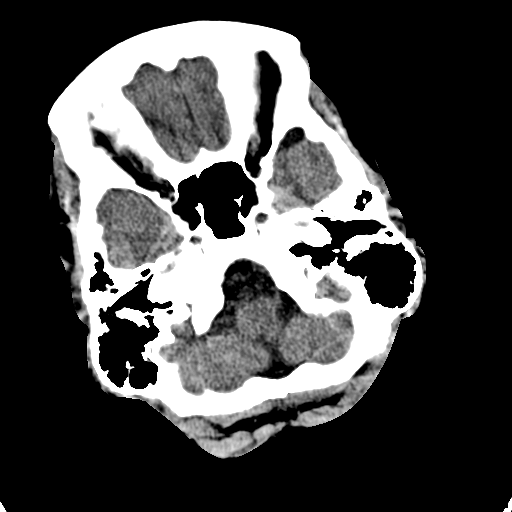
[im 6/30  brain]
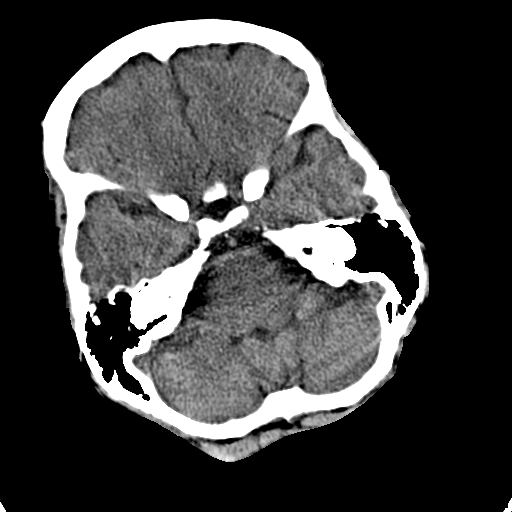
[im 8/30  brain]
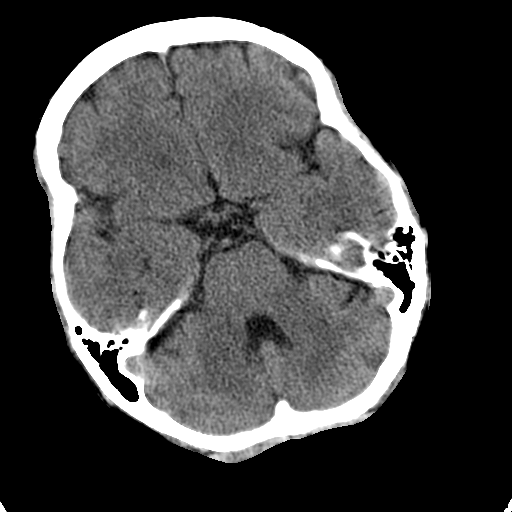
[im 9/30  brain]
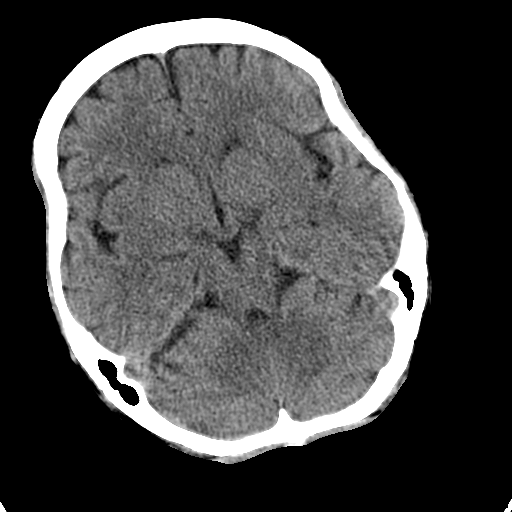
[im 9/30  bone]
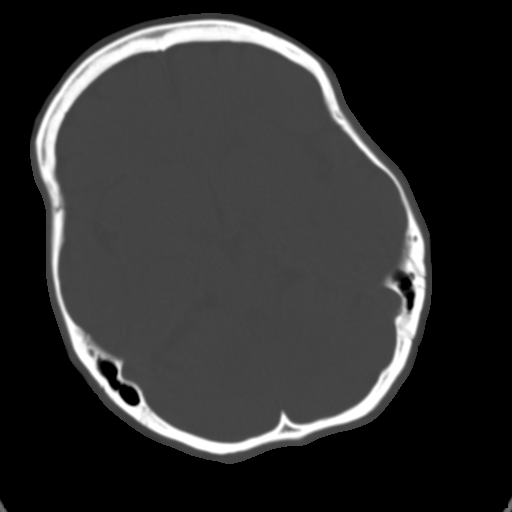
[im 11/30  brain]
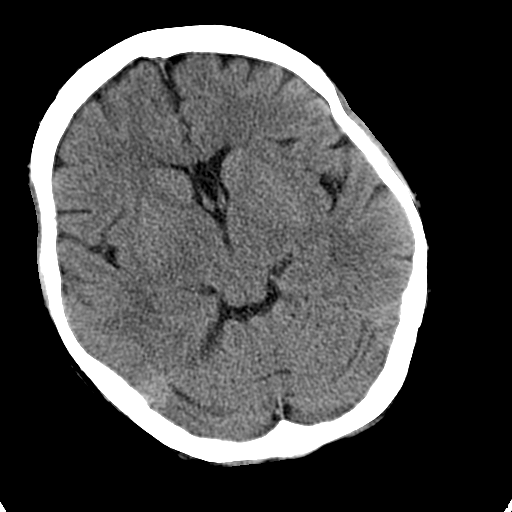
[im 13/30  brain]
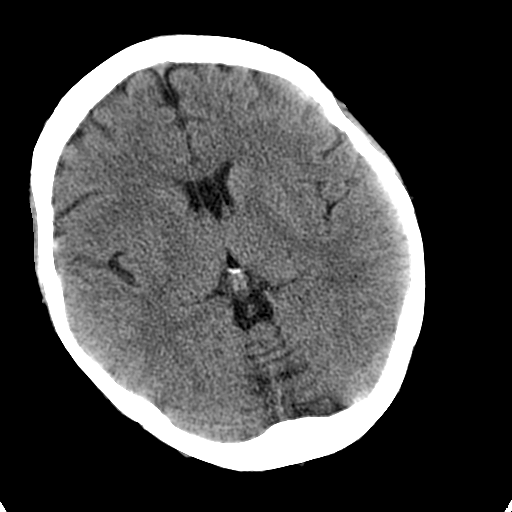
[im 15/30  brain]
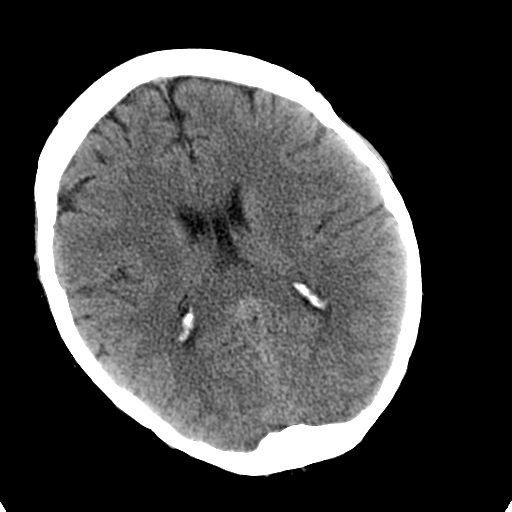
[im 16/30  brain]
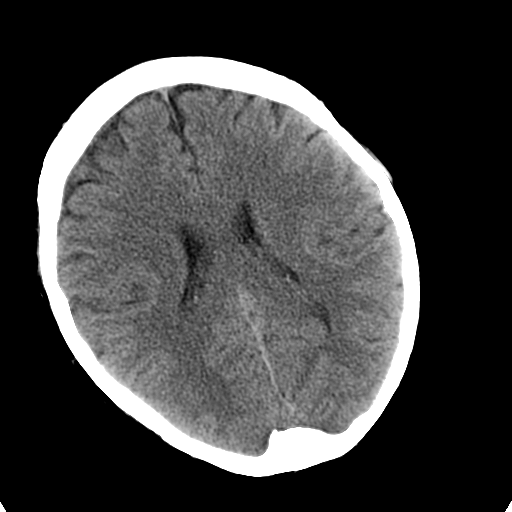
[im 16/30  bone]
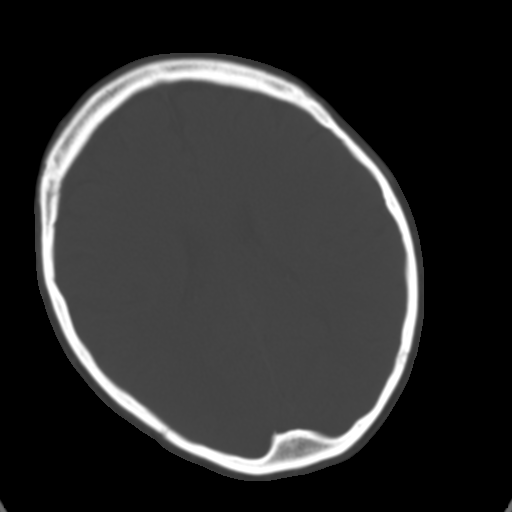
[im 18/30  brain]
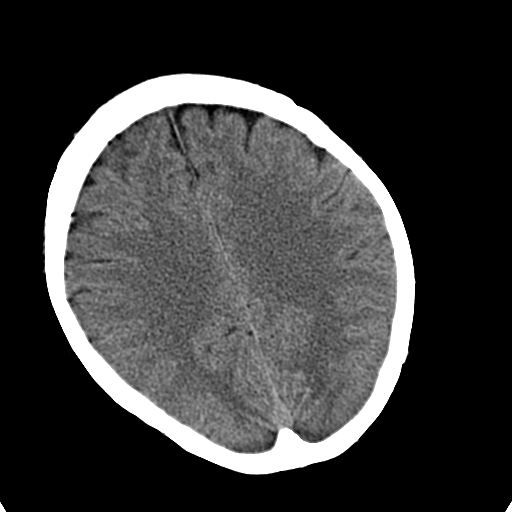
[im 20/30  brain]
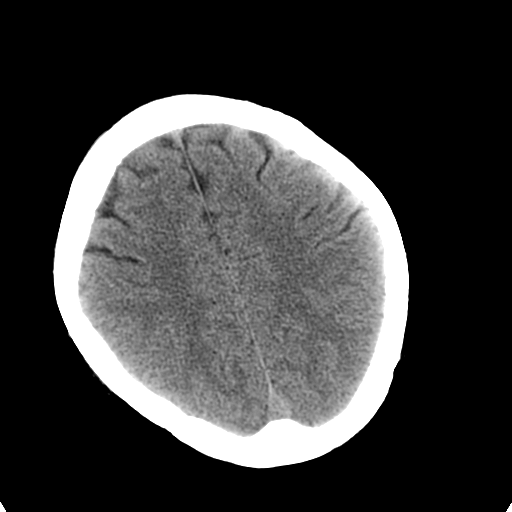
[im 22/30  brain]
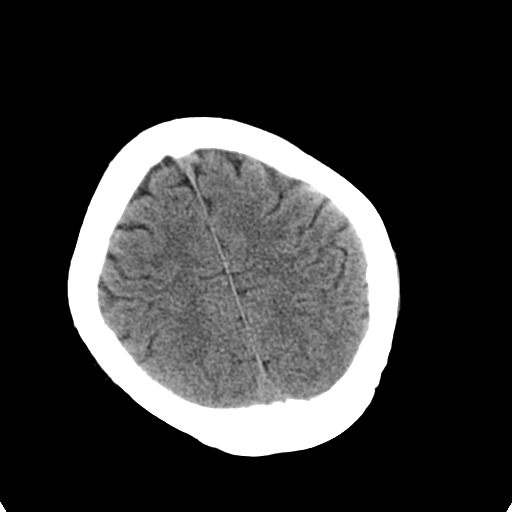
[im 23/30  brain]
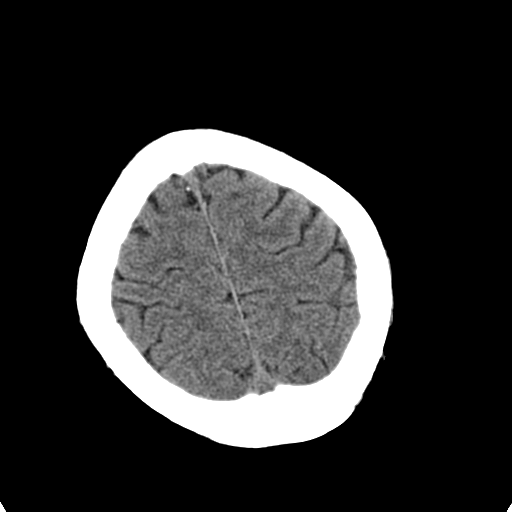
[im 23/30  bone]
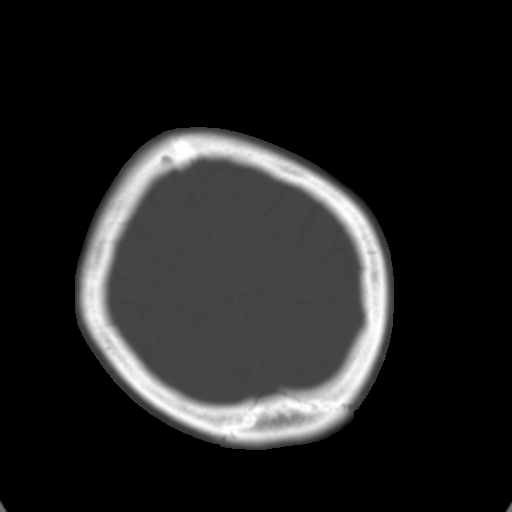
[im 25/30  brain]
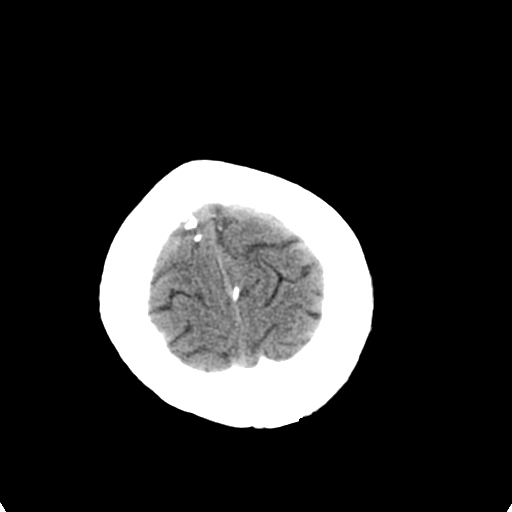
[im 27/30  brain]
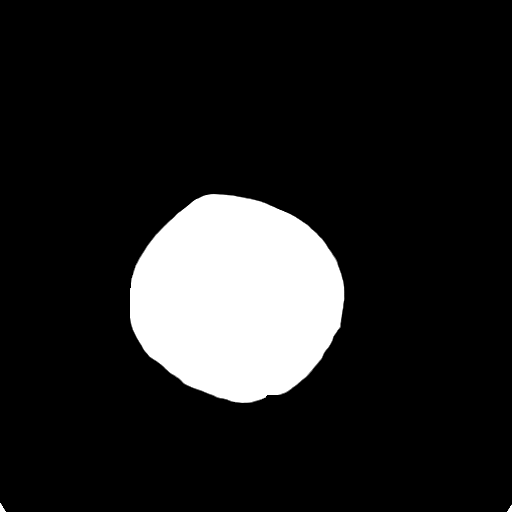
[im 29/30  brain]
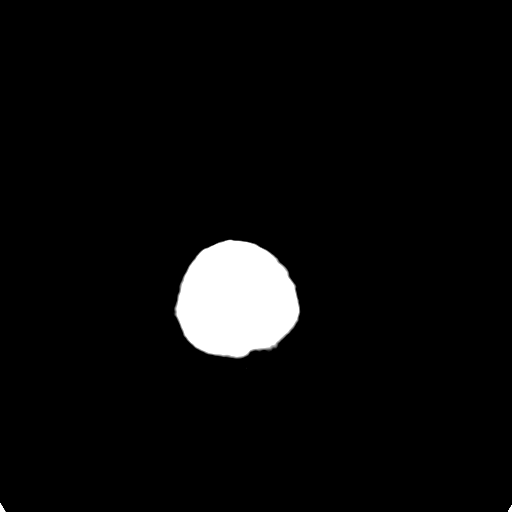

[16 of 30 positions shown; findings below may reference images not displayed]

CT head without contrast:

Comparison [DATE]. Cavum septum pellucidum.  There is no evidence of acute
intracranial hemorrhage, brain edema, mass,  mass effect, or midline shift.
Acute infarct may be inapparent on noncontrast CT.  No other intra-axial
abnormalities are seen, and the ventricles and sulci are within normal limits in
size and symmetry.   No abnormal extra-axial fluid collections or masses are
identified.  No significant calvarial abnormality.
IMPRESSION: 1. Negative non-contrast head CT.

## 2008-12-18 ENCOUNTER — Ambulatory Visit (INDEPENDENT_AMBULATORY_CARE_PROVIDER_SITE_OTHER): Payer: MEDICAID | Admitting: Neurology

## 2008-12-26 ENCOUNTER — Ambulatory Visit (INDEPENDENT_AMBULATORY_CARE_PROVIDER_SITE_OTHER): Payer: MEDICAID | Admitting: Specialist

## 2008-12-26 MED ORDER — PHENOBARBITAL 15 MG TABLET
ORAL_TABLET | ORAL | Status: DC
Start: 2008-12-26 — End: 2009-01-10

## 2008-12-26 MED ORDER — TOPIRAMATE 100 MG TABLET
ORAL_TABLET | ORAL | Status: DC
Start: 2008-12-26 — End: 2012-05-23

## 2008-12-26 MED ORDER — TIZANIDINE 4 MG TABLET
ORAL_TABLET | ORAL | Status: DC
Start: 2008-12-26 — End: 2009-01-10

## 2008-12-26 MED ORDER — PREDNISONE 20 MG TABLET
ORAL_TABLET | ORAL | Status: DC
Start: 2008-12-26 — End: 2009-01-10

## 2008-12-26 MED ORDER — HYDROXYZINE PAMOATE 25 MG CAPSULE
ORAL_CAPSULE | ORAL | Status: DC
Start: 2008-12-26 — End: 2009-01-10

## 2009-01-10 ENCOUNTER — Encounter (HOSPITAL_COMMUNITY): Payer: Self-pay

## 2009-01-11 ENCOUNTER — Encounter (EMERGENCY_DEPARTMENT_HOSPITAL): Payer: MEDICAID

## 2009-01-11 ENCOUNTER — Emergency Department
Admission: EM | Admit: 2009-01-11 | Discharge: 2009-01-11 | Discharge status: Against Medical Advice | Payer: MEDICAID | Attending: Emergency Medicine | Admitting: Emergency Medicine

## 2009-01-11 NOTE — ED Provider Notes (Signed)
HPI Comments: Pt was accidentally placed in an ED bed by triage after she had left without being seen.  I signed up for the patient but could not see the patient because she had already left.  Headache             Review of Systems   HENT: Positive for headaches.          Physical Exam        Course

## 2009-05-06 ENCOUNTER — Encounter (INDEPENDENT_AMBULATORY_CARE_PROVIDER_SITE_OTHER): Payer: MEDICAID | Admitting: Specialist

## 2009-12-02 ENCOUNTER — Ambulatory Visit (INDEPENDENT_AMBULATORY_CARE_PROVIDER_SITE_OTHER): Payer: MEDICAID

## 2009-12-09 ENCOUNTER — Ambulatory Visit (INDEPENDENT_AMBULATORY_CARE_PROVIDER_SITE_OTHER): Payer: MEDICAID

## 2009-12-16 ENCOUNTER — Encounter (INDEPENDENT_AMBULATORY_CARE_PROVIDER_SITE_OTHER): Payer: Self-pay

## 2009-12-16 ENCOUNTER — Other Ambulatory Visit
Admission: RE | Admit: 2009-12-16 | Discharge: 2009-12-16 | Disposition: A | Discharge status: Home / Self Care | Payer: MEDICAID

## 2009-12-16 ENCOUNTER — Ambulatory Visit (INDEPENDENT_AMBULATORY_CARE_PROVIDER_SITE_OTHER): Payer: MEDICAID

## 2009-12-16 VITALS — BP 120/70 | HR 100 | Temp 98.6°F | Ht 66.0 in | Wt 128.0 lb

## 2009-12-16 DIAGNOSIS — N289 Disorder of kidney and ureter, unspecified: Secondary | ICD-10-CM | POA: Insufficient documentation

## 2009-12-16 NOTE — H&P (Addendum)
CHIEF COMPLAINT  "referral for fatty tissue around kidney area"    SUBJECTIVE:  49 yo female who with IBS had a CT scan last month.  She was incidentally found to have a couple of renal lesions--see report below.   She states back in 2003 she had a ct scan in [place] and was told she had a renal cyst.   She denies issues with stones, gross or microscopic hematuria.   She gets UTIs.  She has always gotten infections.   She states she "doesn't get symptoms any more".  She goes to her PCPs every 3 months and her urine is checked and cultured.  She is treated about 3 times a year.   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    She has no other urinary symptoms    CT ABD/PElVIS from the [facility] on 11-28-09:  REPORT states " hypodense mass on in the anterior aspect of the left kidney which may represent a cyst measuring 3mm.   There is a nonobstructing RIGHT renal calculus.  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     Rest of the exam is unremarkable (see scanned).    ROS:   Pertinent positives GU, see above.   Negative for chest pain or vomiting.   All other systems negative    PMH:  Past Medical History   Diagnosis Date   . Migraines    . IBS (irritable bowel syndrome)    . Asthma        PSH:  History reviewed.  No pertinent past surgical history.    MEDICATONS:  Current outpatient prescriptions   Medication Sig   . fluticasone (FLOVENT HFA) 110 mcg/Actuation Aero oral inhaler take 1 Puff by inhalation Twice daily.   Marland Kitchen topiramate (TOPAMAX) 100 mg Tab take by mouth. 3 bid    . Butorphanol Tartrate (STADOL) 10 mg/mL Spry by Nasal route. As needed   . Sumatriptan Succinate (IMITREX) 100 mg Tab take by mouth. As needed   . Zolpidem (AMBIEN) 10 mg Tab take by mouth. 1 qhs   . NECON 1/50 (28) ORAL take by mouth. 1 daily   . VENTOLIN INHL take by inhalation. As needed    . ondansetron (ZOFRAN) 4 mg Tab take by mouth. As needed   . ZYRTEC-D PO take by mouth. 1 q day         ALLERGIES:    Allergies   Allergen Reactions   . Compazine (Prochlorperazine Edisylate) Seizure   . Periactin (Cyproheptadine) Anaphylaxis   . Phenergan (Promethazine) Seizure   . Reglan (Metoclopramide) Seizure       FH:  NOT significant for GU cancer     SOCIAL HISTORY:  Rarely smokes.  Doesn't drink.  Works as a Advertising account executive:  BP 120/70   Pulse 100   Temp(Src) 37 C (98.6 F) (Tympanic)   Ht 1.676 m (5\' 6" )   Wt 58.06 kg (128 lb)   BMI 20.66 kg/m2    PHYSICAL EXAM:    General:  Pt is vitally stable (see values) and she appears anxious but her stated age.   Skin is warm and dry.    HEENT:  Eyes are clear.   Respiratory effort is unlabored.     Psych: Pt is alert and oriented and in no acute distress.      MS:  Pt ambulates well.        Neuro: pt appears neurologically intact.     Abdomen: There is no CVA tenderness, abdomen is soft, nontender and nondisdended.  GU exam deferred.    Urine Dip Results:   Time collected: 1134  Glucose: Negative  Bilirubin: Negative  Ketones: Trace (5 mg/dl)  Specific Gravity: 8.295  Blood: Negative  pH: 5.5  Protein: 1+ (30mg /dL)  Urobilinogen: Normal   Nitrite: Positive  Leukocytes: Moderate         ASSESSMENT:  1.   RIGHT AML (per report)  2.   ?UTI 3.  Right sided stone (per report)    PLAN:  Pt had a lot of questions about her stone but we have no answers for her today because we have no imaging to look at.   We will send her urine for culture and treat accordingly.   She will follow up in 2 weeks with her CD from the [facility].       Toniann Fail PA-C  Surgical Specialties, UROLOGY    I saw the patient on 12/16/09  I have reviewed the information gathered from the patient by the mid-level provider.  I have reviewed and agree with the following:  * Chief complaint  * History of present illness  * Past medical history  * Past surgical history  * Family history   * Allergies  * Review of systems  * Physical examination.  Additionally, I have also examined the patient and my findings are below:    No abdominal pain on exam    Urine Dip Results:   Time collected: 1134  Glucose: Negative  Bilirubin: Negative  Ketones: Trace (5 mg/dl)  Specific Gravity: 6.213  Blood: Negative  pH: 5.5  Protein: 1+ (30mg /dL)  Urobilinogen: Normal   Nitrite: Positive  Leukocytes: Moderate         Assessment and plan: AML, renal calculus, renal cyst, questionable UTI history.    Send urine for culture and follow-up with CT scan.    Theresa Mulligan, MD 12/18/2009, 12:50 PM

## 2009-12-17 LAB — URINE CULTURE

## 2009-12-26 ENCOUNTER — Telehealth (INDEPENDENT_AMBULATORY_CARE_PROVIDER_SITE_OTHER): Payer: Self-pay

## 2009-12-26 MED ORDER — CIPROFLOXACIN 500 MG TABLET
500.00 mg | ORAL_TABLET | Freq: Two times a day (BID) | ORAL | Status: DC
Start: 2009-12-26 — End: 2010-01-06

## 2009-12-26 NOTE — Telephone Encounter (Signed)
Pt's urine culture was positive for an infection.  i received a message to call her in abx when I was on vacation so I just saw the message today.   Spoke with her mother Mcclish was napping) just now on the phone and she said they were never contacted.   Pt is always asymptomatic.       I will fax a week of cipro 500mg  BID to her pharmacy right now.    Toniann Fail PA-C  Surgical Specialties, UROLOGY

## 2010-01-06 ENCOUNTER — Ambulatory Visit (INDEPENDENT_AMBULATORY_CARE_PROVIDER_SITE_OTHER): Payer: MEDICAID

## 2010-01-06 ENCOUNTER — Other Ambulatory Visit
Admission: RE | Admit: 2010-01-06 | Discharge: 2010-01-06 | Disposition: A | Discharge status: Home / Self Care | Payer: MEDICAID

## 2010-01-06 VITALS — BP 104/60 | Temp 97.9°F | Wt 128.0 lb

## 2010-01-06 LAB — URINALYSIS, MICROSCOPIC
HYALINE CAST: 53 /LPF — ABNORMAL HIGH (ref 0–3)
RBC'S: 25 /HPF — ABNORMAL HIGH (ref 0–4)
WBC'S: 65 /HPF — ABNORMAL HIGH (ref 0–6)

## 2010-01-06 NOTE — Progress Notes (Addendum)
S: [name] is a 49 year old female with a history of recurrent UTIs (approximately 3 per year) and IBS who presents today for follow up on CT scan results from 11/2009 at the [facility]. She was recently treated for a documented e coli UTI from 5/10 with seven days of Cipro. Other than some occasional dull R > L flank pain, she is otherwise doing well and has no voiding complaints. Denies any dysuria or gross hematuria. She underwent a CT ABD PELVIS for further workup of her IBS at that time, which incidentally revealed a 2.1 cm right AML and a small punctate stone in the lower pole on the same side. There was also a 3 mm cystic lesion noted on the left kidney.  Given her history of UTIs, a PVR was checked today. The first PVR today was 218 ccs. A second PVR was obtained today after voiding a second time, which was 161 ccs. Official CT report was as follows:    CT ABD/PELVIS from the [facility] on 11-28-09: Report states " hypodense mass on in the anterior aspect of the left kidney which may represent a cyst measuring 3mm. There is a nonobstructing RIGHT renal calculus. 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 Remainder of imaging study was unremarkable.    LABS:  Urine Dip Results:   Time collected: 1001  Glucose: Negative  Bilirubin: Small  Ketones: Trace (5 mg/dl)  Specific Gravity: 7.829  Blood: Trace Intact  pH: 5.0  Protein: 1+ (30mg /dL)  Urobilinogen: Normal   Nitrite: Negative  Leukocytes: Large       O: Appears vitally stable, in NAD; Respirations are unlabored; abdomen is soft, nondistended. GU exam deferred.    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 Patient was given Dr. Lynne Logan card today, and was instructed to call to obtain the results of her culture/urinalysis in approximately three days. No definitive management for the punctate right renal calculus is necessary at this point in time.      2) Continue conservative management/surveillance of bilateral AMLs. Patient was informed that these lesions, although benign, could potentially cause symptoms of flank pain/spontaneous hemorrhage if the lesions grow large enough.      3) She was offered cystoscopy with urodynamics for more formal evaluation of today's findings. She has deferred any further evaluation at this point in time. Unless she decides to see [place] sooner, we will see her back in six months with a renal ultrasound for surveillance of her AML(s).    Charlann Boxer, M.D  Resident, Division of Urology      I saw the patient on 01/06/10.      I saw and examined the patient.  I reviewed the resident's note.  I agree with the findings and plan of care as documented in the resident's note.  Any exceptions/additions are edited/noted.    CT was reviewed by myself from 11/28/09:  Likely posterior right renal AML.  Small left renal lesion, cyst versus AML.  Small nonobstructing right renal calculus.  We will send her urine for culture/microscopic analysis.  She will  contact us for the results.  She will tentatively be seen in 6 months with a renal ultrasound assuming that she doesn't want a more detailed evaluation sooner.    Urine Dip Results:   Time collected: 1001  Glucose: Negative  Bilirubin: Small  Ketones: Trace (5 mg/dl)  Specific Gravity: 9.563  Blood: Trace Intact  pH: 5.0  Protein: 1+ (30mg /dL)  Urobilinogen: Normal   Nitrite: Negative  Leukocytes: Large       PVR by bladder scan: 161 mL    Can Talug, MD 01/07/2010, 10:57 AM

## 2010-01-08 LAB — URINE CULTURE

## 2010-01-12 ENCOUNTER — Telehealth (INDEPENDENT_AMBULATORY_CARE_PROVIDER_SITE_OTHER): Payer: Self-pay | Admitting: Urology

## 2010-01-12 NOTE — Telephone Encounter (Signed)
Name: Teresa Ramirez  MRN: [id number]  Date of Birth: 04/27/1961  Date of Consultation: 01/12/2010      Patient called for her urine culture results.  Called patient back and reviewed her urine culture with her. CULTURE OBSERVATION 01/06/2010: Multiple organisms present, suggestive of contamination.        She currently is doing well without dysuria or urinary frequency/urgency.  She agrees to contact us or see her PCP if she develops symptoms so that she can have her urine cultured.        Gurney Maxin, PA-C 01/12/2010, 2:04 PM

## 2010-01-13 ENCOUNTER — Other Ambulatory Visit (INDEPENDENT_AMBULATORY_CARE_PROVIDER_SITE_OTHER): Payer: Self-pay

## 2010-01-13 NOTE — Progress Notes (Signed)
Pt's last micro-UA was abnormal (25 RBC/hpf).   We would like to repeat it.   She will come to the hospital and give a sample in 2-3 months.  She will not have a clinic visit.  We will review the results and let her know.   She may need to have a cystoscopy if still abnormal.    Toniann Fail, PA-C 01/13/2010, 12:45 PM

## 2010-07-14 ENCOUNTER — Ambulatory Visit (INDEPENDENT_AMBULATORY_CARE_PROVIDER_SITE_OTHER): Payer: MEDICAID

## 2010-07-14 ENCOUNTER — Encounter (INDEPENDENT_AMBULATORY_CARE_PROVIDER_SITE_OTHER): Payer: Self-pay

## 2010-09-15 ENCOUNTER — Ambulatory Visit (INDEPENDENT_AMBULATORY_CARE_PROVIDER_SITE_OTHER): Payer: MEDICAID

## 2010-09-15 ENCOUNTER — Ambulatory Visit: Payer: MEDICAID

## 2010-10-06 ENCOUNTER — Encounter (INDEPENDENT_AMBULATORY_CARE_PROVIDER_SITE_OTHER): Payer: Self-pay

## 2010-10-06 ENCOUNTER — Ambulatory Visit
Admission: RE | Admit: 2010-10-06 | Discharge: 2010-10-06 | Disposition: A | Discharge status: Home / Self Care | Payer: MEDICAID | Source: Ambulatory Visit

## 2010-10-06 ENCOUNTER — Ambulatory Visit (INDEPENDENT_AMBULATORY_CARE_PROVIDER_SITE_OTHER): Payer: MEDICAID

## 2010-10-06 ENCOUNTER — Ambulatory Visit (INDEPENDENT_AMBULATORY_CARE_PROVIDER_SITE_OTHER)
Admission: RE | Admit: 2010-10-06 | Discharge: 2010-10-06 | Disposition: A | Discharge status: Home / Self Care | Payer: MEDICAID | Source: Ambulatory Visit | Admitting: Rheumatology

## 2010-10-06 VITALS — BP 140/86 | Temp 98.8°F | Wt 121.0 lb

## 2010-10-06 DIAGNOSIS — R3129 Other microscopic hematuria: Secondary | ICD-10-CM | POA: Insufficient documentation

## 2010-10-06 DIAGNOSIS — D3000 Benign neoplasm of unspecified kidney: Secondary | ICD-10-CM | POA: Insufficient documentation

## 2010-10-06 DIAGNOSIS — N390 Urinary tract infection, site not specified: Secondary | ICD-10-CM | POA: Insufficient documentation

## 2010-10-06 LAB — URINALYSIS, MICROSCOPIC
HYALINE CAST: 64 /LPF — ABNORMAL HIGH (ref 0–3)
RBC'S: 7 /HPF — ABNORMAL HIGH (ref 0–4)
WBC'S: 58 /HPF — ABNORMAL HIGH (ref 0–6)

## 2010-10-06 NOTE — Progress Notes (Signed)
S:  50 yo female with bilateral AML's and UTI's.  She was found to have an elevated residual last visit of 161 cc.  She had about 3 UTI's a year prior to that.    Since knowing that information she has been making a conscious effort to empty better and hasn't had any.    RUS today shows normal appearing kidneys without hydronephrosis or stones.  She has 2 right sided AML's measuring 6 mm and 2.7 cm and 1 LEFT sided AML measuring 1 cm.  Her residual is 38 cc.  She also has a history of microscopic hematuria.  Last year her UA showed 25 RBC/hpf. This was done 2 weeks after a culture positive UTI.   She was asked to repeat the urine analysis here sometime in late spring, without coming back to clinic, but states she just didn't do it.      CT ABD/PELVIS from the [facility] on 11-28-09: Report states " hypodense mass on in the anterior aspect of the left kidney which may represent a cyst measuring 3mm. There is a nonobstructing RIGHT renal calculus. 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 Remainder of imaging study was unremarkable.    O:  Pt is alert and oriented, in no acute distress.  she looks her stated age and appears vitally stable.   she is resting comfortably with an unlabored respiratory effort.   Skin is warm and dry and pt appears neurologically intact.  Pt ambulates well.  There is no CVA tenderness, abdomen soft, nontender, nondistended.   GU exam deferred.    Urine Dip Results:   Time collected: 1545  Glucose: Negative  Bilirubin: Negative  Ketones: Negative  Specific Gravity: 1.030  Blood: Large (3+)  pH: 6.0  Protein: Trace  Urobilinogen: Normal   Nitrite: Negative  Leukocytes: Small        A:  1.  Bilateral AML's  2.  Microscopic hematuria     P:  Her urine will be sent for microscopic analysis today.  I will call her tomorrow with the results.  She will follow up in 6 weeks with another urine analysis.  We would like to see 2 out of 3 positive urine before offering her the full hematuria work up of CT IVP, cytoscopy and cytology.  She is not too eager to have this done and prefers to go with the UA's for now.       Toniann Fail PA-C  Surgical Specialties, UROLOGY    I saw the patient on 10/06/10.    I have reviewed the information gathered from the patient by the mid-level provider.  I have reviewed and agree with the following:  Subjective  Objective  Assessment  Plan    Additionally, I have also examined the patient and my findings are below:    Abdomen soft, NT, no CVAT    Urine Dip Results:   Time collected: 1545  Glucose: Negative  Bilirubin: Negative  Ketones: Negative  Specific Gravity: 1.030  Blood: Large (3+)  pH: 6.0  Protein: Trace  Urobilinogen: Normal   Nitrite: Negative  Leukocytes: Small         Assessment and plan: AMLs, UTIs, and microscopic hematuria.    Check UA and follow-up in 6 weeks.    Theresa Mulligan, MD 10/12/2010, 10:38 AM

## 2010-11-17 ENCOUNTER — Encounter (INDEPENDENT_AMBULATORY_CARE_PROVIDER_SITE_OTHER): Payer: MEDICAID

## 2010-12-15 ENCOUNTER — Encounter (INDEPENDENT_AMBULATORY_CARE_PROVIDER_SITE_OTHER): Payer: MEDICAID
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
## 2011-01-13 ENCOUNTER — Ambulatory Visit (HOSPITAL_BASED_OUTPATIENT_CLINIC_OR_DEPARTMENT_OTHER): Payer: MEDICAID

## 2011-01-13 ENCOUNTER — Other Ambulatory Visit (INDEPENDENT_AMBULATORY_CARE_PROVIDER_SITE_OTHER): Payer: Self-pay

## 2011-01-13 ENCOUNTER — Ambulatory Visit
Admission: RE | Admit: 2011-01-13 | Discharge: 2011-01-13 | Disposition: A | Discharge status: Home / Self Care | Payer: MEDICAID | Source: Ambulatory Visit

## 2011-01-13 NOTE — Progress Notes (Addendum)
CC: Blood In my Urine    S:   The patient is a very pleasant 50 y.o. female     RUS showed normal appearing kidneys without hydronephrosis or stones. She has 2 right sided AML's measuring 6 mm and 2.7 cm and 1 LEFT sided AML measuring 1 cm.     CT ABD/PELVIS from the [facility] on 11-28-09: Report states " hypodense mass on in the anterior aspect of the left kidney which may represent a cyst measuring 3mm. There is a nonobstructing RIGHT renal calculus. 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 Remainder of imaging study was unremarkable.    09/2010:     Ref. Range 10/06/2010 16:15   MICROSCOPIC URINALYSIS No range found Rpt (A)   RBC'S Latest Range: 0-4 /HPF 7  MICROSCOPIC ANALYSIS PERFORMED BY AUTOMATED METHOD (H)   WBC'S Latest Range: 0-6 /HPF 58 (H)   BACTERIA Latest Range: OCL^OCCASIONAL OR LESS  FEW (A)     Urine Dip Results:   Time collected: 1120  Glucose: Negative  Bilirubin: Negative  Ketones: Negative  Specific Gravity: 1.020  Blood: Negative  pH: 6.0  Protein: Negative  Urobilinogen: Normal   Nitrite: Negative  Leukocytes: Negative      The patient denies any nausea, vomiting, chest pain, loc, sob, dyspnea, fevers, chills, abdominal/flank pain, weight loss, dysuria, or hematuria.       O:  Constitutional - NAD  Eyes - Clear  Neuro - AAO x 3   CV - RR  Pulm - CTA B  GI - S-NTND + BS, No flank tenderness  MSK - Patient can ambulate without difficulty   Skin - Warm and Dry    A/P:  1) Bilateral AML's - Patient denies any flank pain. They are less than 4 cm. The patient will be scheduled for US kidney in one year  2) Microscopic Hematuria - Patient is now requesting the microscopic hematuria workup. CT IVP, cytology, and cysto have been orders    -Of note, the patient will be referred to Internal Medicine for establishment of care, history of anemia.     Velva Harman, MD.    I saw the patient on 01/13/11.       I saw and examined the patient.  I reviewed the resident's note.  I agree with the findings and plan of care as documented in the resident's note.  Any exceptions/additions are edited/noted.    Urine Dip Results:   Time collected: 1120  Glucose: Negative  Bilirubin: Negative  Ketones: Negative  Specific Gravity: 1.020  Blood: Negative  pH: 6.0  Protein: Negative  Urobilinogen: Normal   Nitrite: Negative  Leukocytes: Negative      Can Talug, MD 01/18/2011, 9:33 AM

## 2011-01-14 LAB — HISTORICAL URINARY CYTOLOGY

## 2011-01-26 ENCOUNTER — Encounter (INDEPENDENT_AMBULATORY_CARE_PROVIDER_SITE_OTHER): Payer: MEDICAID

## 2011-01-28 ENCOUNTER — Other Ambulatory Visit (INDEPENDENT_AMBULATORY_CARE_PROVIDER_SITE_OTHER): Payer: Self-pay

## 2011-02-03 ENCOUNTER — Other Ambulatory Visit (HOSPITAL_COMMUNITY): Payer: Self-pay

## 2011-02-23 ENCOUNTER — Ambulatory Visit
Admission: RE | Admit: 2011-02-23 | Discharge: 2011-02-23 | Disposition: A | Discharge status: Home / Self Care | Payer: MEDICAID | Source: Ambulatory Visit

## 2011-02-23 ENCOUNTER — Ambulatory Visit (HOSPITAL_BASED_OUTPATIENT_CLINIC_OR_DEPARTMENT_OTHER): Payer: MEDICAID

## 2011-02-23 ENCOUNTER — Encounter (INDEPENDENT_AMBULATORY_CARE_PROVIDER_SITE_OTHER): Payer: Self-pay

## 2011-02-23 ENCOUNTER — Other Ambulatory Visit (HOSPITAL_COMMUNITY): Payer: Self-pay

## 2011-02-23 ENCOUNTER — Ambulatory Visit (HOSPITAL_BASED_OUTPATIENT_CLINIC_OR_DEPARTMENT_OTHER)
Admission: RE | Admit: 2011-02-23 | Discharge: 2011-02-23 | Disposition: A | Discharge status: Home / Self Care | Payer: MEDICAID | Source: Ambulatory Visit

## 2011-02-23 VITALS — BP 122/88 | Temp 97.3°F | Wt 124.0 lb

## 2011-02-23 DIAGNOSIS — N289 Disorder of kidney and ureter, unspecified: Secondary | ICD-10-CM | POA: Insufficient documentation

## 2011-02-23 MED ORDER — IOVERSOL 320 MG IODINE/ML INTRAVENOUS SOLUTION
100.00 mL | INTRAVENOUS | Status: AC
Start: 2011-02-23 — End: 2011-02-23
  Administered 2011-02-23: 100 mL via INTRAVENOUS

## 2011-02-23 MED ORDER — FUROSEMIDE 10 MG/ML INJECTION SOLUTION
5.00 mg | INTRAMUSCULAR | Status: AC
Start: 2011-02-23 — End: 2011-02-23
  Administered 2011-02-23: 5 mg via INTRAVENOUS

## 2011-02-23 MED ORDER — CIPROFLOXACIN 500 MG TABLET
500.00 mg | ORAL_TABLET | Freq: Two times a day (BID) | ORAL | Status: DC
Start: 2011-02-23 — End: 2011-02-25

## 2011-02-23 NOTE — Procedures (Addendum)
[facility]   DEPARTMENT OF UROLOGY   PROCEDURE SUMMARY     PATIENT NAME: Teresa Ramirez [facility] NUMBER: [id number]   DATE OF SERVICE: 02/23/2011   DATE OF BIRTH: 08-03-1961     PREOPERATIVE DIAGNOSIS: Left AML, microhematuria     POSTOPERATIVE DIAGNOSIS: same     NAME OF PROCEDURE: 1. Flexible cystourethroscopy    FINDINGS: 1. No masses, lesions stones noted in bladder, 2. Bilateral UOs in normal anatomic position  ANESTHESIA: 2% lidocaine gel.     SURGEON(S): Dr. Ricki Miller   RESIDENT(S): [place] P. Ely Ballen, MD  ESTIMATED BLOOD LOSS: None.     COMPLICATIONS: None.  INDICATIONS FOR PROCEDURE: Teresa Ramirez is a 49 y.o. female who is currently being followed by Urology for microhematuria.  A Previous renal ultrasound and CT A/P demonstrated a hypodense mass on in the anterior aspect of the left kidney which may represent a cyst measuring 3mm. There is a nonobstructing RIGHT renal calculus. 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 Remainder of imaging study was unremarkable. Patient presents today with a CT IVP and to undergo cystoscopy to complete her hematuria workup.     DESCRIPTION OF PROCEDURE: The patient was consented preoperatively and all risks and benefits of the procedure were explained including bleeding, infection, pain, bladder and urethral injury. She was laid in the supine position and his genitalia were prepped and draped in the standard sterile fashion. 10cc of 2% Lidocaine Uro-Jet were infused into the urethra. We passed a 17-French flexible cystoscope through the urethra and into the bladder under direct visualization. Upon entering the bladder, panendoscopic views were obtained. Both ureteral orifices were observed to be of normal appearance and in their expected anatomic position. There was good efflux from both UOs. Upon viewing the bladder, there were no masses, lesions or stones noted. The scope was then pulled back to examine the urethra. No urethral abnormalities were noted. The patient tolerated this procedure well.    Dr. Ricki Miller was present and participated in the entire procedure.  DISPOSITION: The patient will be discharged home with a 3-day course of antibiotics. She will follow up in 6 months and provide a urine sample for analysis at that time.  Patient will need a renal ultrasound in July 2013 - we will order this when she returns to clinic in 6 months. Patient understands the plan and is in agreement.    [place] P Colter Magowan, MD 02/23/2011, 1:57 PM    [place] P. Uchenna Rappaport, M.D.  Resident, [facility]  Division of Urology    I saw the patient on 02/23/11.  I was present and participated in the entire procedure.  Urine Dip Results:   Time collected: 1312  Glucose: Negative  Bilirubin: Negative  Ketones: Negative  Specific Gravity: 1.005  Blood: Negative  pH: 7.5  Protein: Negative  Urobilinogen: Normal   Nitrite: Negative  Leukocytes: Negative        Can Talug, MD 03/01/2011, 7:37 AM

## 2011-02-25 ENCOUNTER — Encounter (INDEPENDENT_AMBULATORY_CARE_PROVIDER_SITE_OTHER): Payer: Self-pay | Admitting: GENERAL

## 2011-02-25 ENCOUNTER — Ambulatory Visit
Admission: RE | Admit: 2011-02-25 | Discharge: 2011-02-25 | Disposition: A | Discharge status: Home / Self Care | Payer: MEDICAID | Source: Ambulatory Visit | Attending: GENERAL | Admitting: GENERAL

## 2011-02-25 ENCOUNTER — Ambulatory Visit (HOSPITAL_BASED_OUTPATIENT_CLINIC_OR_DEPARTMENT_OTHER): Payer: MEDICAID | Admitting: GENERAL

## 2011-02-25 ENCOUNTER — Ambulatory Visit (INDEPENDENT_AMBULATORY_CARE_PROVIDER_SITE_OTHER): Payer: MEDICAID | Admitting: Rheumatology

## 2011-02-25 DIAGNOSIS — G43909 Migraine, unspecified, not intractable, without status migrainosus: Secondary | ICD-10-CM | POA: Insufficient documentation

## 2011-02-25 DIAGNOSIS — Z23 Encounter for immunization: Secondary | ICD-10-CM | POA: Insufficient documentation

## 2011-02-25 DIAGNOSIS — G4700 Insomnia, unspecified: Secondary | ICD-10-CM | POA: Insufficient documentation

## 2011-02-25 DIAGNOSIS — D3000 Benign neoplasm of unspecified kidney: Secondary | ICD-10-CM | POA: Insufficient documentation

## 2011-02-25 DIAGNOSIS — Z1231 Encounter for screening mammogram for malignant neoplasm of breast: Secondary | ICD-10-CM | POA: Insufficient documentation

## 2011-02-25 DIAGNOSIS — E039 Hypothyroidism, unspecified: Secondary | ICD-10-CM | POA: Insufficient documentation

## 2011-02-25 DIAGNOSIS — R569 Unspecified convulsions: Secondary | ICD-10-CM | POA: Insufficient documentation

## 2011-02-25 LAB — URINALYSIS, MACROSCOPIC AND MICROSCOPIC
BILIRUBIN: NEGATIVE
GLUCOSE: NEGATIVE mg/dL
KETONES: NEGATIVE mg/dL
LEUKOCYTES: NEGATIVE
NITRITE: POSITIVE — AB
PH URINE: 6 (ref 5.0–8.0)
PROTEIN: 100 mg/dL — AB
RBC'S: NONE SEEN /HPF (ref 0–4)
SPECIFIC GRAVITY, URINE: 1.03 (ref 1.005–1.030)
UROBILINOGEN: 8 mg/dL — ABNORMAL HIGH
WBC'S: 0 /HPF (ref 0–6)

## 2011-02-25 LAB — CBC/DIFF
BASOPHILS: 0 % (ref 0–1)
BASOS ABS: 0.013 THOU/uL (ref 0.0–0.2)
EOSINOPHIL: 0 % — ABNORMAL LOW (ref 1–6)
HCT: 37.2 % (ref 33.5–45.2)
HGB: 12.4 g/dL (ref 11.5–15.2)
LYMPHOCYTES: 34 % (ref 20–45)
LYMPHS ABS: 1.94 THOU/uL (ref 1.0–4.8)
MCH: 30.8 pg (ref 27.4–33.0)
MCHC: 33.4 g/dL (ref 31.6–35.5)
MCV: 92.3 fL (ref 82.0–99.0)
MONOCYTES: 5 % (ref 4–13)
MONOS ABS: 0.292 THOU/uL (ref 0.1–0.9)
MPV: 8.6 FL (ref 7.4–10.4)
NRBC'S: 0 /100{WBCs}
PLATELET COUNT: 231 THOU/uL (ref 140–450)
PMN ABS: 3.38 THOU/uL (ref 1.5–7.7)
PMN'S: 61 % (ref 40–75)
RBC: 4.03 MIL/uL (ref 3.84–5.04)
RDW: 11.9 % (ref 10.2–14.0)
WBC: 5.6 THOU/uL (ref 3.5–11.0)

## 2011-02-25 LAB — LIPID PANEL
CHOLESTEROL: 241 mg/dL — ABNORMAL HIGH (ref <200)
HDL-CHOLESTEROL: 57 mg/dL (ref >39)
LDL (CALCULATED): 150 mg/dL — ABNORMAL HIGH (ref <100)
NON - HDL (CALCULATED): 184 mg/dL (ref <190)
VLDL (CALCULATED): 34 mg/dL — ABNORMAL HIGH (ref <30)

## 2011-02-25 LAB — THYROID STIMULATING HORMONE WITH FREE T4 REFLEX: THYROID STIMULATING HORMONE WITH FREE T4 REFLEX: 1.741 u[IU]/mL (ref 0.300–5.900)

## 2011-02-25 MED ORDER — PROPRANOLOL ER 60 MG CAPSULE,24 HR,EXTENDED RELEASE
60.0000 mg | ORAL_CAPSULE | Freq: Every day | ORAL | Status: DC
Start: 2011-02-25 — End: 2012-06-01

## 2011-02-25 MED ORDER — PAROXETINE 20 MG TABLET
20.00 mg | ORAL_TABLET | Freq: Every morning | ORAL | Status: DC
Start: 2011-02-25 — End: 2018-03-30

## 2011-02-25 MED ORDER — TRAZODONE 50 MG TABLET
100.0000 mg | ORAL_TABLET | Freq: Every evening | ORAL | Status: DC | PRN
Start: 2011-02-25 — End: 2017-01-27

## 2011-02-25 MED ORDER — PROPRANOLOL 60 MG TABLET
60.00 mg | ORAL_TABLET | Freq: Three times a day (TID) | ORAL | Status: DC
Start: 2011-02-25 — End: 2011-02-25

## 2011-02-25 NOTE — Progress Notes (Signed)
Tdap administered per orders to L deltoid via IM injection.  Patient tolerated without difficulty and left clinic without complaints.  Dezyrae Kensinger Genella Mech, RN 02/25/2011, 11:59 AM
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
## 2011-02-25 NOTE — Progress Notes (Signed)
Called in the rx of Inderal LA to pharmacy Delsa Bern, LPN

## 2011-02-26 ENCOUNTER — Encounter (INDEPENDENT_AMBULATORY_CARE_PROVIDER_SITE_OTHER): Payer: Self-pay | Admitting: GENERAL

## 2011-02-26 NOTE — Progress Notes (Signed)
Trazodone rx. called to [facility].

## 2011-02-27 LAB — URINE CULTURE: CULTURE OBSERVATION: NO GROWTH

## 2011-03-02 NOTE — Progress Notes (Signed)
Addended by: Sharyl Nimrod R on: 03/02/2011 02:06 PM     Modules accepted: Level of Service

## 2011-03-05 ENCOUNTER — Telehealth (INDEPENDENT_AMBULATORY_CARE_PROVIDER_SITE_OTHER): Payer: Self-pay | Admitting: GENERAL

## 2011-04-08 ENCOUNTER — Encounter (INDEPENDENT_AMBULATORY_CARE_PROVIDER_SITE_OTHER): Payer: MEDICAID | Admitting: GENERAL

## 2011-04-08 ENCOUNTER — Ambulatory Visit (HOSPITAL_COMMUNITY): Payer: Self-pay

## 2011-04-19 ENCOUNTER — Encounter (INDEPENDENT_AMBULATORY_CARE_PROVIDER_SITE_OTHER): Payer: MEDICAID | Admitting: Specialist

## 2011-05-06 ENCOUNTER — Encounter (INDEPENDENT_AMBULATORY_CARE_PROVIDER_SITE_OTHER): Payer: MEDICAID | Admitting: GENERAL

## 2011-06-03 ENCOUNTER — Encounter (INDEPENDENT_AMBULATORY_CARE_PROVIDER_SITE_OTHER): Payer: MEDICAID | Admitting: GENERAL

## 2011-08-31 ENCOUNTER — Encounter (INDEPENDENT_AMBULATORY_CARE_PROVIDER_SITE_OTHER): Payer: MEDICAID

## 2011-09-02 ENCOUNTER — Ambulatory Visit (HOSPITAL_BASED_OUTPATIENT_CLINIC_OR_DEPARTMENT_OTHER): Admission: RE | Admit: 2011-09-02 | Payer: MEDICAID | Source: Ambulatory Visit | Admitting: Urology

## 2011-11-20 ENCOUNTER — Inpatient Hospital Stay
Admission: EM | Admit: 2011-11-20 | Discharge: 2011-11-22 | DRG: 556 | Disposition: A | Discharge status: Home / Self Care | Payer: MEDICAID | Attending: Neurology | Admitting: Neurology

## 2011-11-20 ENCOUNTER — Encounter (HOSPITAL_COMMUNITY): Payer: Self-pay

## 2011-11-20 ENCOUNTER — Emergency Department (EMERGENCY_DEPARTMENT_HOSPITAL): Payer: MEDICAID

## 2011-11-20 ENCOUNTER — Inpatient Hospital Stay (HOSPITAL_COMMUNITY): Payer: MEDICAID | Admitting: Pulmonary Disease

## 2011-11-20 DIAGNOSIS — F17200 Nicotine dependence, unspecified, uncomplicated: Secondary | ICD-10-CM | POA: Diagnosis present

## 2011-11-20 DIAGNOSIS — E039 Hypothyroidism, unspecified: Secondary | ICD-10-CM | POA: Diagnosis present

## 2011-11-20 DIAGNOSIS — R29898 Other symptoms and signs involving the musculoskeletal system: Principal | ICD-10-CM | POA: Diagnosis present

## 2011-11-20 DIAGNOSIS — G40909 Epilepsy, unspecified, not intractable, without status epilepticus: Secondary | ICD-10-CM | POA: Diagnosis present

## 2011-11-20 DIAGNOSIS — K589 Irritable bowel syndrome without diarrhea: Secondary | ICD-10-CM | POA: Diagnosis present

## 2011-11-20 DIAGNOSIS — M21869 Other specified acquired deformities of unspecified lower leg: Secondary | ICD-10-CM | POA: Diagnosis present

## 2011-11-20 DIAGNOSIS — J45909 Unspecified asthma, uncomplicated: Secondary | ICD-10-CM | POA: Diagnosis present

## 2011-11-20 DIAGNOSIS — G43909 Migraine, unspecified, not intractable, without status migrainosus: Secondary | ICD-10-CM | POA: Diagnosis present

## 2011-11-20 DIAGNOSIS — E785 Hyperlipidemia, unspecified: Secondary | ICD-10-CM | POA: Diagnosis present

## 2011-11-20 DIAGNOSIS — M21339 Wrist drop, unspecified wrist: Secondary | ICD-10-CM | POA: Diagnosis present

## 2011-11-20 DIAGNOSIS — R209 Unspecified disturbances of skin sensation: Secondary | ICD-10-CM | POA: Diagnosis present

## 2011-11-20 HISTORY — DX: Cerebral infarction, unspecified (CMS HCC): I63.9

## 2011-11-20 LAB — BASIC METABOLIC PANEL
ANION GAP: 9 mmol/L (ref 5–16)
BUN/CREAT RATIO: 10 (ref 6–22)
BUN: 8 mg/dL (ref 6–20)
CALCIUM: 9.1 mg/dL (ref 8.5–10.4)
CARBON DIOXIDE: 21 mmol/L — ABNORMAL LOW (ref 22–32)
CHLORIDE: 106 mmol/L (ref 96–111)
CREATININE: 0.78 mg/dL (ref 0.49–1.10)
ESTIMATED GLOMERULAR FILTRATION RATE: >59 ml/min/1.73m2 (ref >59)
GLUCOSE,NONFAST: 81 mg/dL (ref 65–139)
POTASSIUM: 3.9 mmol/L (ref 3.5–5.1)
SODIUM: 136 mmol/L (ref 136–145)

## 2011-11-20 LAB — CBC/DIFF
BASOPHILS: 0 %
BASOS ABS: 0 10*3/uL (ref 0.0–0.2)
EOS ABS: 0 THOU/uL (ref 0.0–0.5)
EOSINOPHIL: 0 %
HCT: 37.2 % (ref 33.5–45.2)
HGB: 12.3 g/dL (ref 11.2–15.2)
LYMPHOCYTES: 29 %
LYMPHS ABS: 2.8 THOU/uL (ref 1.5–3.5)
MCH: 31.8 pg (ref 27.4–33.0)
MCHC: 33 g/dL (ref 32.5–35.8)
MCV: 96.1 fL (ref 78–100)
MONOCYTES: 6 %
MONOS ABS: 0.6 10*3/uL (ref 0.3–1.0)
MPV: 9.5 fL (ref 7.5–11.5)
PLATELET COUNT: 232 10*3/uL (ref 140–450)
PMN ABS: 6.4 THOU/uL (ref 2.0–6.5)
PMN'S: 65 %
RBC: 3.87 MIL/uL (ref 3.63–4.92)
RDW: 14.2 % (ref 12.0–15.0)
WBC: 9.9 10*3/uL (ref 3.5–11.0)

## 2011-11-20 LAB — TYPE AND SCREEN
ABO/RH(D): O POS
ANTIBODY SCREEN: NEGATIVE

## 2011-11-20 LAB — TROPONIN-I (FOR ED ONLY): TROPONIN-I: <0.01 ng/mL (ref <0.030)

## 2011-11-20 LAB — PT/INR
INR: 1 (ref 0.8–1.2)
PROTHROMBIN TIME: 10.9 s (ref 9.1–11.5)

## 2011-11-20 LAB — PERFORM POC ISTAT CREATININE POINT OF CARE: CREATININE, POC: 0.8 mg/dL (ref 0.49–1.10)

## 2011-11-20 LAB — CREATINE KINASE (CK), TOTAL, SERUM: CREATINE KINASE (CK): 171 U/L — ABNORMAL HIGH (ref 24–170)

## 2011-11-20 LAB — PTT (PARTIAL THROMBOPLASTIN TIME): APTT: 22.1 s (ref 22.0–32.0)

## 2011-11-20 LAB — PERFORM POC WHOLE BLOOD GLUCOSE: GLUCOSE, POINT OF CARE: 72 mg/dL (ref 70–105)

## 2011-11-20 MED ORDER — ONDANSETRON HCL 4 MG TABLET
4.00 mg | ORAL_TABLET | Freq: Three times a day (TID) | ORAL | Status: DC | PRN
Start: 2011-11-20 — End: 2011-11-22
  Filled 2011-11-20 (×3): qty 1

## 2011-11-20 MED ORDER — SODIUM CHLORIDE 0.9 % INTRAVENOUS SOLUTION
INTRAVENOUS | Status: DC
Start: 2011-11-20 — End: 2011-11-21
  Administered 2011-11-20: 75 mL/h via INTRAVENOUS

## 2011-11-20 MED ORDER — IOVERSOL 350 MG IODINE/ML INTRAVENOUS SOLUTION
150.00 mL | INTRAVENOUS | Status: AC
Start: 2011-11-20 — End: 2011-11-20
  Administered 2011-11-20: 150 mL via INTRAVENOUS

## 2011-11-20 MED ORDER — TOPIRAMATE 100 MG TABLET
300.00 mg | ORAL_TABLET | ORAL | Status: AC
Start: 2011-11-20 — End: 2011-11-20
  Administered 2011-11-20: 300 mg via ORAL
  Filled 2011-11-20: qty 3

## 2011-11-20 MED ORDER — FERROUS SULFATE 324 MG (65 MG IRON) TABLET,DELAYED RELEASE
324.0000 mg | DELAYED_RELEASE_TABLET | Freq: Every day | ORAL | Status: DC
Start: 2011-11-20 — End: 2011-11-22
  Administered 2011-11-20 – 2011-11-21 (×2): 324 mg via ORAL
  Filled 2011-11-20 (×3): qty 1

## 2011-11-20 MED ORDER — ALTEPLASE 50 MG INTRAVENOUS SOLUTION
0.90 mg/kg | INTRAVENOUS | Status: AC
Start: 2011-11-20 — End: 2011-11-20
  Administered 2011-11-20: 49 mg via INTRAVENOUS
  Filled 2011-11-20: qty 49

## 2011-11-20 MED ORDER — TOPIRAMATE 100 MG TABLET
300.00 mg | ORAL_TABLET | Freq: Two times a day (BID) | ORAL | Status: DC
Start: 2011-11-20 — End: 2011-11-22
  Administered 2011-11-20 – 2011-11-22 (×4): 300 mg via ORAL
  Administered 2011-11-22: 0 mg via ORAL
  Filled 2011-11-20 (×5): qty 3

## 2011-11-20 MED ORDER — MOMETASONE 220 MCG (14 DOSES) BREATH ACTIVATED POWDER AEROSOL - RN
1.00 | INHALATION_SPRAY | Freq: Every evening | RESPIRATORY_TRACT | Status: DC
Start: 2011-11-20 — End: 2011-11-22
  Administered 2011-11-20 – 2011-11-21 (×2): 1 via RESPIRATORY_TRACT
  Filled 2011-11-20: qty 1

## 2011-11-20 MED ORDER — PAROXETINE 20 MG TABLET
20.00 mg | ORAL_TABLET | Freq: Every morning | ORAL | Status: DC
Start: 2011-11-20 — End: 2011-11-22
  Administered 2011-11-20 – 2011-11-21 (×2): 20 mg via ORAL
  Administered 2011-11-22: 0 mg via ORAL
  Filled 2011-11-20 (×4): qty 1

## 2011-11-20 MED ORDER — FERROUS SULFATE 325 MG (65 MG IRON) TABLET
325.0000 mg | ORAL_TABLET | Freq: Every day | ORAL | Status: DC
Start: 2011-11-21 — End: 2011-11-20

## 2011-11-20 MED ORDER — ALBUTEROL SULFATE HFA 90 MCG/ACTUATION AEROSOL INHALER - RN
2.00 | Freq: Four times a day (QID) | RESPIRATORY_TRACT | Status: DC | PRN
Start: 2011-11-20 — End: 2011-11-22
  Administered 2011-11-21: 2 via RESPIRATORY_TRACT
  Filled 2011-11-20: qty 8

## 2011-11-20 NOTE — Consults (Addendum)
[facility]  Stroke Senior Resident Note    Teresa Ramirez, Teresa Ramirez, 51 y.o. female  Date of Admission:  11/20/2011  Date of Birth:  [birth year]    HPI:   51yo female with no stroke risk factors who presents with L arm weakness and L hemibody numbness.  LSN was when she went to bed at 115am and when she woke up she has these symptoms.    NIH Stroke Scale Baseline: 2  NIH Stroke Scale (latest): 2    EXAM:  Temperature: 37 C (98.6 F)  Heart Rate: 67   BP (Non-Invasive): 154/74 mmHg  Respiratory Rate: 18   SpO2-1: 96 %  Pain Score: 7  General: appears in good health  Knowledge: Good  Language: Normal  Speech: Normal  Cranial nerves: Cranial nerves 2-12 are normal except: L facial numbness in V1-V3  Coordination: Coordination is normal without tremor  Sensory: Sensory exam in the upper and lower extremities is normal except as noted: Decreased sensation in the left arm and leg  Motor strength:  Normal strength except noted at L upper extremity hand weakness that may be more consistent with a radial nerve palsy.    Assessment/Plan:    51 yo female with no stroke risk factors who presents with L hemibody numbness and L arm weakness.  With her history of drop foot this may be a radial nerve palsy, however stroke is on the differential given her numbness.  She was consented and given tPA.    -Admit to MICU  -q2 hour vitals and neuro check  -Telemetry  -TEE to eval for cardioembolic source  -MRI brain with and without contrast  -FLP, statin as needed    -Start ASA 81mg  daily at 24 hours  -PT.OT  -Speech/Swallow    Reason TPA Not Given: Other NA    Stroke Team Times  Onset: 115am  Stroke Page: 348am  Arrival: 355am  Team at Bedside: 355am  IV TPA Bolus: 458am  Time Intervention Radiology Notified: NA  Anesthesia: NA    Rico Ala, MD 11/20/2011, 5:19 AM

## 2011-11-20 NOTE — Anticoag (Signed)
 Received a call from Dr. Dempsey Edman requesting that pharmacy have TPA ready for administration.  We had time so I asked Dr. Edman to go ahead and place the order so the drug could be completely ready (label included) in the event that it would be used.  He place the order in [facility] at [phone].  I received a phone call from Dr. Edman confirming that the drug would definitely be used at 0456 and it was delivered to the ED at patient's bedside at 0458.     DOROTHA Glendia Gully, PharmD  Inpatient Pharmacist

## 2011-11-20 NOTE — ED Nurses Note (Signed)
Patient TPA began.  2nd IV placed 22 gauge in left hand.

## 2011-11-20 NOTE — H&P (Addendum)
[facility]  MICU History and Physical    Teresa Ramirez, Teresa Ramirez, 51 y.o. female  Date of Admission:  11/20/2011  Date of service: 11/20/2011  Date of Birth:  1960-10-24    PCP: Joan Mayans, MD    Information Obtained from: patient and health care provider  Chief Complaint: left sided numbness    HPI: Teresa Ramirez is a 51 y.o., White female who presents with left sided numbness.  She states that she went to bed around 1:15am today (she felt completely fine and was neurologically intact) but then woke up at 1:30am and her left hand was numb.  Then after a few minutes her left leg went numb as well.  Also had some right sided squeezing chest pain without radiation.  First time having these abnormal neurologic sensations.  No hx of TIAs or strokes.  She then presented to the ED and received tPA at 4:58am.  When I went to see her, she stated she was feeling better.  Still has numbness in her LUE and LLE but improved (not resolved yet).  No CP, SOB, nausea, vomiting, dizziness, abdominal pain, diarrhea, constipation or dysuria.    Past Medical History   Diagnosis Date   . Migraines    . IBS (irritable bowel syndrome)    . Asthma    . Angiomyolipoma of kidney    . Environmental allergies    . Epilepsy    . Hypothyroid    . Insomnia      Past Surgical History   Procedure Date   . Hx hand surgery        Medications Prior to Admission     Medication    paroxetine (PAXIL) 20 mg Oral Tablet    take 1 Tab by mouth QAM.    trazodone (DESYREL) 50 mg Oral Tablet    take 2 Tabs by mouth Every night as needed.    propranolol (INDERAL LA) 60 mg Oral Capsule,Sustained Action 24 hr    take 1 Cap by mouth Once a day.    LEVOTHYROXINE SODIUM (SYNTHROID ORAL)    take  by mouth Once a day.      ferrous sulfate (IRON) 325 mg (65 mg iron) Oral Tablet    take 325 mg by mouth Once a day.      fluticasone (FLOVENT HFA) 110 mcg/Actuation Aero oral inhaler     take 1 Puff by inhalation Twice daily.    topiramate (TOPAMAX) 100 mg Tab    take by mouth. 3 bid     Butorphanol Tartrate (STADOL) 10 mg/mL Spry    by Nasal route. As needed    Sumatriptan Succinate (IMITREX) 100 mg Tab    take by mouth. As needed    Zolpidem (AMBIEN) 10 mg Tab    take by mouth. 1 qhs    NECON 1/50 (28) ORAL    take by mouth. 1 daily    VENTOLIN INHL    take by inhalation. As needed    ondansetron (ZOFRAN) 4 mg Tab    take by mouth. As needed    ZYRTEC-D PO    take by mouth. 1 q day          Current Facility-Administered Medications:  NS premix infusion  Intravenous Continuous       Allergies   Allergen Reactions   . Compazine (Prochlorperazine Edisylate) Seizure   . Haldol (Haloperidol)    . Periactin (Cyproheptadine) Anaphylaxis   . Phenergan (Promethazine) Seizure   .  Reglan (Metoclopramide) Seizure       Family History  Family History   Problem Relation Age of Onset   . Cancer Neg Hx    . Diabetes Neg Hx    . Heart Attack Maternal Grandfather 70   . Heart Attack Maternal Grandmother 70   . Hypertension Maternal Grandmother    . Hypertension Mother    . Seizures Neg Hx    . Elevated Lipids Mother        Social History  History   Substance Use Topics   . Smoking status: Current Some Day Smoker -- 0.1 packs/day for 3 years     Types: Cigarettes   . Smokeless tobacco: Never Used    Comment: "maybe about a pack a month, on and off for 3 years"   . Alcohol Use: No        ROS:   Other than ROS in the HPI, all other systems were negative.    EXAM:  Temperature: 37 C (98.6 F)  Heart Rate: 70   BP (Non-Invasive): 152/76 mmHg  Respiratory Rate: 18   SpO2-1: 96 %  Pain Score: 7  General: appears in good health and no distress  Eyes: Conjunctiva clear., Pupils equal and round.   HENT:Head atraumatic and normocephalic, ENT without erythema or injection, mucous membranes moist.  Neck: No JVD  Lungs: Clear to auscultation bilaterally.    Cardiovascular: regular rate and rhythm, S1, S2 normal, no murmur, click, rub or gallop  Abdomen: Soft, non-tender, Bowel sounds normal, non-distended  Extremities: extremities normal, atraumatic, no cyanosis or edema, no edema, redness or tenderness in the calves or thighs  Skin: Skin warm and dry  Neurologic: CN II - XII grossly intact , Alert and oriented x3, muscle strength 5/5 in upper and lower extremities; decreased sensation in LUE and LLE  Lymphatics: No lymphadenopathy  Psychiatric: Normal    Labs:    Lab Results for Last 24 Hours:    Results for orders placed during the hospital encounter of 11/20/11 (from the past 24 hour(s))   CBC/DIFF       Component Value Range    WBC 9.9  3.5 - 11.0 THOU/uL    RBC 3.87  3.63 - 4.92 MIL/uL    HGB 12.3  11.2 - 15.2 g/dL    HCT 16.1  09.6 - 04.5 %    MCV 96.1  78 - 100 fL    MCH 31.8  27.4 - 33.0 pg    MCHC 33.0  32.5 - 35.8 g/dL    RDW 40.9  81.1 - 91.4 %    PLATELET COUNT 232  140 - 450 THOU/uL    MPV 9.5  7.5 - 11.5 fL    PMN'S 65      PMN ABS 6.400  2.0 - 6.5 THOU/uL    LYMPHOCYTES 29      LYMPHS ABS 2.800  1.5 - 3.5 THOU/uL    MONOCYTES 6      MONOS ABS 0.600  0.3 - 1.0 THOU/uL    EOSINOPHIL 0      EOS ABS 0.000  0.0 - 0.5 THOU/uL    BASOPHILS 0      BASOS ABS 0.000  0.0 - 0.2 THOU/uL   BASIC METABOLIC PANEL, NON-FASTING       Component Value Range    SODIUM 136  136 - 145 mmol/L    POTASSIUM 3.9  3.5 - 5.1 mmol/L    CHLORIDE 106  96 - 111  mmol/L    CARBON DIOXIDE 21 (*) 22 - 32 mmol/L    ANION GAP 9  5 - 16 mmol/L    CREATININE 0.78  0.49 - 1.10 mg/dL    ESTIMATED GLOMERULAR FILTRATION RATE >59  >59 ml/min/1.89m2    GLUCOSE,NONFAST 81  65 - 139 mg/dL    BUN 8  6 - 20 mg/dL    BUN/CREAT RATIO 10  6 - 22    CALCIUM 9.1  8.5 - 10.4 mg/dL   PT/INR       Component Value Range    PROTHROMBIN TIME 10.9  9.1 - 11.5 Sec    INR 1.0  0.8 - 1.2   PTT (PARTIAL THROMBOPLASTIN TIME)       Component Value Range    APTT 22.1  22.0 - 32.0 Sec   TROPONIN-I (FOR ED ONLY)        Component Value Range    TROPONIN-I <0.010  <0.030 ng/mL   CREATINE KINASE (CK), TOTAL       Component Value Range    CREATINE KINASE (CK) 171 (*) 24 - 170 U/L   TYPE AND SCREEN       Component Value Range    UNITS ORDERED NOT STATED          ABO/RH(D) O POSITIVE      ANTIBODY SCREEN NEGATIVE      SPECIMEN EXPIRATION DATE 11/23/2011     HCG, SERUM QUANTITATIVE, PREGNANCY       Component Value Range    HCG QUANTITATIVE/PREG <1  <5 mIU/mL   POCT WHOLE BLOOD GLUCOSE       Component Value Range    GLUCOSE, POINT OF CARE 72  70 - 105 mg/dL   POCT ISTAT CREATININE POINT OF CARE       Component Value Range    CREATININE, POC 0.8  0.49 - 1.10 mg/dL       Imaging Studies:  CT stroke protocol (4/13):  1. No acute intracranial process.   2. Symmetric perfusion maps without evidence for ischemia or infarct.   3. Unremarkable intra-and extracranial CT angiography.      DNR Status: Full Code    Assessment/Plan:  Active Hospital Problems    Diagnosis   . Left arm weakness     Patient is a 51yo female who presents with left sided numbness, s/p tPA at 4:58am today.  1. Left sided numbness s/p tPA.   - May be a TIA.   - Repeat CT brain in 24 hours.   - Will order an MRI brain.   - Goal SBP 120-160.   - Will hold all anticoagulation for 24 hours.   - PT, OT, Speech/Swallow ordered.   - Will check a fasting lipid panel.      DVT/PE Prophylaxis: Not indicated        Bubba Hales, MD 11/20/2011, 6:47 AM  Karen Chafe, MD  PGY-3  Department of Internal Medicine  [facility]    I saw and evaluated the patient. I reviewed the resident's note. I agree with the findings and plan of care as documented in the resident's note. Any exceptions/additions are noted above.   Steele Berg, M.D.  Critical care time 35 minutes

## 2011-11-20 NOTE — ED Resident Handoff Note (Signed)
Code Status: Full    Allergies   Allergen Reactions   . Compazine (Prochlorperazine Edisylate) Seizure   . Haldol (Haloperidol)    . Periactin (Cyproheptadine) Anaphylaxis   . Phenergan (Promethazine) Seizure   . Reglan (Metoclopramide) Seizure       Filed Vitals:    11/20/11 0533 11/20/11 0553 11/20/11 0613 11/20/11 0633   BP: 143/74 155/72 152/76 155/74   Pulse: 69 68 70 64   Temp:       Resp:       Height:       Weight:       SpO2: 96% 96% 96% 95%       HPI:  In brief, patient is a 51 y.o. femalewho presents to the emergency department due to 1 am LUE weakness. Sensation deficit to LLE.     Pertinent Exam Findings:  LUE wrist drop    Pertinent Imaging/Lab results:  Labs Reviewed   BASIC METABOLIC PANEL, NON-FASTING - Abnormal; Notable for the following:     CARBON DIOXIDE 21 (*)      All other components within normal limits   CREATINE KINASE (CK), TOTAL - Abnormal; Notable for the following:     CREATINE KINASE (CK) 171 (*)      All other components within normal limits   CBC/DIFF   PT/INR   PTT (PARTIAL THROMBOPLASTIN TIME)   TROPONIN-I (FOR ED ONLY)   TYPE AND SCREEN   HCG, SERUM QUANTITATIVE, PREGNANCY   STROKE PATIENT     CTSP- negative, pt received TPA    Pending Studies:  none    Consults:  Neuro    Plan:  Received tPA.   After a thorough discussion of the patient including presentation, ED course, and review of above information I have assumed care of Teresa Ramirez from Dr. Kellie Shropshire at 6:59 AM    Leary Roca, MD    Course:  Pt admitted to neurology.    Leary Roca, MD 11/20/2011, 7:00 AM

## 2011-11-20 NOTE — ED Nurses Note (Signed)
Patient speech and swallow exam completed, patient passed.

## 2011-11-20 NOTE — ED Nurses Note (Signed)
Patient returned to ED room 14 following CT scan.

## 2011-11-20 NOTE — ED Nurses Note (Signed)
Bedside SBAR received from R. Delena Serve.  Assuming care for pt at this time.  Pt AOx4.  NIHSS score a 2.  Pt still has complaints of L sided numbness on ULE.  Pt does not have any complaints of pain at this time.  Pt does not have any request at this time.

## 2011-11-20 NOTE — ED Nurses Note (Signed)
Patient to CT scan on cart via RN and MD.

## 2011-11-20 NOTE — H&P (Addendum)
[facility]  Stroke ADMISSION   History and Physical      Teresa, Ramirez, 51 y.o. female  Date of Admission:  11/20/2011  Date of Birth:  June 09, 1961    PCP: Joan Mayans, MD  Consult Requested By:  ED    Information Obtained from: patient and mother  Chief Complaint:  Left hand weakness, left sided numbness    HPI:  Teresa Ramirez is a 51 y.o., White female who presents with left hand weakness, and numbness on left side of face, left hand, and left leg. She went to bed at 1:15 today with a mild headache and awoke 15 minutes later feeling numbness on her left face, arm and leg, left wrist drop and sharp right sided chest pain. She has a history of migraines, seizures (generailized tonic-clonic, last seizure in 2008) for which she takes topamax, and history of left foot drop that has resolved. Also history of right hand fracture that has hardware in place; was in 2007 at a hospital in [place], doesn't remember the name. She never has paraesthesias with her migraines.           Earliest Symptom(s) Onset: 1:30 AM  Bedtime if awoke with symptoms: 1:15 AM     NIH Stroke Scale Baseline: Total Score: 2  (11/20/11 0525)  NIH Stroke Scale (latest): Total Score: 2  (11/20/11 0525)    Past Medical History   Diagnosis Date   . Migraines    . IBS (irritable bowel syndrome)    . Asthma    . Angiomyolipoma of kidney    . Environmental allergies    . Epilepsy    . Hypothyroid    . Insomnia      Past Surgical History   Procedure Date   . Hx hand surgery      Medications Prior to Admission     Medication    paroxetine (PAXIL) 20 mg Oral Tablet    take 1 Tab by mouth QAM.    trazodone (DESYREL) 50 mg Oral Tablet    take 2 Tabs by mouth Every night as needed.    propranolol (INDERAL LA) 60 mg Oral Capsule,Sustained Action 24 hr    take 1 Cap by mouth Once a day.    LEVOTHYROXINE SODIUM (SYNTHROID ORAL)    take  by mouth Once a day.       ferrous sulfate (IRON) 325 mg (65 mg iron) Oral Tablet    take 325 mg by mouth Once a day.      fluticasone (FLOVENT HFA) 110 mcg/Actuation Aero oral inhaler    take 1 Puff by inhalation Twice daily.    topiramate (TOPAMAX) 100 mg Tab    take by mouth. 3 bid     Butorphanol Tartrate (STADOL) 10 mg/mL Spry    by Nasal route. As needed    Sumatriptan Succinate (IMITREX) 100 mg Tab    take by mouth. As needed    Zolpidem (AMBIEN) 10 mg Tab    take by mouth. 1 qhs    NECON 1/50 (28) ORAL    take by mouth. 1 daily    VENTOLIN INHL    take by inhalation. As needed    ondansetron (ZOFRAN) 4 mg Tab    take by mouth. As needed    ZYRTEC-D PO    take by mouth. 1 q day          Current Facility-Administered Medications:  NS premix infusion  Intravenous Continuous  Allergies   Allergen Reactions   . Compazine (Prochlorperazine Edisylate) Seizure   . Haldol (Haloperidol)    . Periactin (Cyproheptadine) Anaphylaxis   . Phenergan (Promethazine) Seizure   . Reglan (Metoclopramide) Seizure     History   Substance Use Topics   . Smoking status: Current Some Day Smoker -- 0.1 packs/day for 3 years     Types: Cigarettes   . Smokeless tobacco: Never Used    Comment: "maybe about a pack a month, on and off for 3 years"   . Alcohol Use: No     Family History   Problem Relation Age of Onset   . Cancer Neg Hx    . Diabetes Neg Hx    . Heart Attack Maternal Grandfather 70   . Heart Attack Maternal Grandmother 70   . Hypertension Maternal Grandmother    . Hypertension Mother    . Seizures Neg Hx    . Elevated Lipids Mother         ROS: Other than ROS in the HPI, all other systems were negative.    EXAM:  Temperature: 37 C (98.6 F)  Heart Rate: 69   BP (Non-Invasive): 143/74 mmHg  Respiratory Rate: 18   SpO2-1: 96 %  Pain Score: 7  General: appears in good health  HENT:Head atraumatic and normocephalic  Neck: No JVD  Carotids:Carotids normal without bruit  Lungs: Clear to auscultation bilaterally.    Cardiovascular: regular rate and rhythm  Abdomen: Soft, non-tender  Extremities: No cyanosis or edema  Ophthalomscopic: normal w/o hemorrhages, exudates, or papilledema  Mental status:  Level of Consciousness: alert  Orientations: Alert and oriented x 3  MemoryRegistration, Recall, and Following of commands is normal  AttentionsAttention and Concentration are normal  Knowledge: Good  Language: Normal  Speech: Normal  Cranial nerves:   CN2: Visual acuity and fields intact  CN 3,4,6: EOMI, PERRLA  CN 5Facial sensation intact  CN 7Face symmetrical  CN 8: Hearing grossly intact  CN 9,10: Palate symmetric   CN 11: Sternocleidomastoid and Trapezius have normal strength.  CN 12: Tongue normal with no fasiculations or deviation  Gait, Coordination, and Reflexes:   Gait: patient declined due to numbness but can sit up in bed  Coordination: Coordination is normal without tremor  Reflexes: Reflexes are 2/2 throughout  Motor:   Muscle tone:  Upper and lower muscle tone is normal except decreased tone in left wrist  Motor strength:  Normal strength except noted at left wrist extension 0/5  Sensory: Sensory exam in the upper and lower extremities is normal except as noted: decreased to light touch and temperature on left arm and leg       Labs:    Lab Results for Last 24 Hours:    Results for orders placed during the hospital encounter of 11/20/11 (from the past 24 hour(s))   CBC/DIFF       Component Value Range    WBC 9.9  3.5 - 11.0 THOU/uL    RBC 3.87  3.63 - 4.92 MIL/uL    HGB 12.3  11.2 - 15.2 g/dL    HCT 69.6  29.5 - 28.4 %    MCV 96.1  78 - 100 fL    MCH 31.8  27.4 - 33.0 pg    MCHC 33.0  32.5 - 35.8 g/dL    RDW 13.2  44.0 - 10.2 %    PLATELET COUNT 232  140 - 450 THOU/uL    MPV 9.5  7.5 -  11.5 fL    PMN'S 65      PMN ABS 6.400  2.0 - 6.5 THOU/uL    LYMPHOCYTES 29      LYMPHS ABS 2.800  1.5 - 3.5 THOU/uL    MONOCYTES 6      MONOS ABS 0.600  0.3 - 1.0 THOU/uL    EOSINOPHIL 0      EOS ABS 0.000  0.0 - 0.5 THOU/uL     BASOPHILS 0      BASOS ABS 0.000  0.0 - 0.2 THOU/uL   BASIC METABOLIC PANEL, NON-FASTING       Component Value Range    SODIUM 136  136 - 145 mmol/L    POTASSIUM 3.9  3.5 - 5.1 mmol/L    CHLORIDE 106  96 - 111 mmol/L    CARBON DIOXIDE 21 (*) 22 - 32 mmol/L    ANION GAP 9  5 - 16 mmol/L    CREATININE 0.78  0.49 - 1.10 mg/dL    ESTIMATED GLOMERULAR FILTRATION RATE >59  >59 ml/min/1.64m2    GLUCOSE,NONFAST 81  65 - 139 mg/dL    BUN 8  6 - 20 mg/dL    BUN/CREAT RATIO 10  6 - 22    CALCIUM 9.1  8.5 - 10.4 mg/dL   PT/INR       Component Value Range    PROTHROMBIN TIME 10.9  9.1 - 11.5 Sec    INR 1.0  0.8 - 1.2   PTT (PARTIAL THROMBOPLASTIN TIME)       Component Value Range    APTT 22.1  22.0 - 32.0 Sec   TROPONIN-I (FOR ED ONLY)       Component Value Range    TROPONIN-I <0.010  <0.030 ng/mL   CREATINE KINASE (CK), TOTAL       Component Value Range    CREATINE KINASE (CK) 171 (*) 24 - 170 U/L   TYPE AND SCREEN       Component Value Range    UNITS ORDERED NOT STATED          ABO/RH(D) O POSITIVE      ANTIBODY SCREEN NEGATIVE      SPECIMEN EXPIRATION DATE 11/23/2011     HCG, SERUM QUANTITATIVE, PREGNANCY       Component Value Range    HCG QUANTITATIVE/PREG <1  <5 mIU/mL   POCT WHOLE BLOOD GLUCOSE       Component Value Range    GLUCOSE, POINT OF CARE 72  70 - 105 mg/dL   POCT ISTAT CREATININE POINT OF CARE       Component Value Range    CREATININE, POC 0.8  0.49 - 1.10 mg/dL       TPA Considered: yes  Time Bolus Given: 4:58 AM    Imaging Studies: 11/20/11 CT-SP: No acute intracranial process. Symmetric perfusion maps without evidence for ischemia or infarct. Unremarkable intra-and extracranial CT angiography.       Assessment/Plan: 51 y.o. Female with history of migraines, seizures on topamax, and untreated hypercholesterolemia presents with left sided numbness, left wrist drop, and sharp right sided chest pain.     Left sided numbness and left wrist extension weakness s/p tPA  - Admit to ICU    - Vitals and neuro checks per ICU protocol   - No antiplatelets or anticoagulation for 24 hours. No NG, foley or invasive lines for 24 hours.   - PT/OT/SS   - NPO until passes bedside swallow evaluation   - Fasting  lipid panel   - Monitor BP. Goal systolic 120-160.   - MRI brain w/wo contrast.   - CT brain 24 hours s/p TPA   - NIHSS 1 hour and 24 hours s/p TPA  - if MRI negative, suggest outpatient NCS/EMG to work-up potential radial nerve palsy, especially in light of history of left foot drop  - tobacco cessation therapy    IBS, asthma , allergies, insomnia stable cont home meds.    DVT/PE Prophylaxis: Not indicated      Victorino Sparrow, DO 11/20/2011, 5:48 AM          Late entry for 11/20/11. I saw and examined the patient.  I reviewed the resident's note.  I agree with the findings and plan of care as documented in the resident's note.  Any exceptions/additions are edited/noted.    Dion Body, MD 11/21/2011, 10:01 PM

## 2011-11-20 NOTE — ED Nurses Note (Signed)
FS 72

## 2011-11-20 NOTE — ED Nurses Note (Signed)
Patient states feeling less numbness in extremities.  Pupils PERRL at 4mm.  Patient Alert and oriented.  GCS 4,5,6.   Patient movement of left hand improving..  Patient H/A remains unchanged.

## 2011-11-20 NOTE — ED Nurses Note (Signed)
Pt was able to ambulate to the restroom without assistance.  Upon return from restroom, ecchymosis noted on pt's L bicep.  Pt does not know origin.  Pt also has ecchymosis on LAC but that was due to unsuccessful venipuncture.  Pt has no request at this time.

## 2011-11-20 NOTE — ED Nurses Note (Signed)
Patient mother as contact [phone].  Teresa Ramirez.

## 2011-11-20 NOTE — ED Nurses Note (Signed)
 Patient resting in bed.  No changes in status since TPA begun.  Patient alert and oriented.  Patient H/A unchanged.

## 2011-11-20 NOTE — ED Nurses Note (Signed)
Patient continues to rest in bed.  Patient concious and alert.  GCS 4,5,6.  Patient numbness unchanged.   Patient H/A unchanged.   Patient vitals remain stable.

## 2011-11-20 NOTE — H&P (Addendum)
[facility]  NIH Stroke Scale    1 hour post treatment assessment  Date of EXAM: 11/20/11        Time of EXAM: 5:01 AM  Person Administering Scale: Victorino Sparrow, DO      1a.  0 Level of consciousness:     0 = Alert; keenly responsive.   1 = Not alert; but arousable by minor stimulation   2 = Not alert; requires repeated stimulation  3 = COMA   1b. 0 LOC questions:   Ask patient month/Their age.   0 = Answers both questions correctly.   1 = Answers one question correctly.   2 = Answers neither question correctly.     1c. 0 LOC commands:  Ask patient to open/close eyes.   0 = OBEYS both tasks correctly.   1 = OBEYS one task correctly.   2 = OBEYS neither task correctly.     2.  0 Best Gaze:  Only horizontal eye movements will be tested.   0 = Normal.   1 = Partial gaze palsy  2 = Forced deviation   3. 0  Visual:   0 = No visual loss.   1 = Partial hemianopia.   2 = Complete hemianopia.   3 = Bilateral hemianopia (blind including cortical blindness).       4. 0 Facial Palsy:  Ask patient to show teeth or raise eyebrows and close eyes. 0 = Normal symmetrical movements.   1 = Minor paralysis (flattened nasolabial fold, asymmetry on smiling).   2 = Partial paralysis (total or near-total paralysis of lower face).   3 = Complete paralysis of one or both sides (absence of facial movement in the upper and lower face).     5a. 1  Motor left arm:     0 = No drift; limb holds 90 (or 45) degrees for full 10 seconds.   1 = Drift  2 = Some effort against gravity  3 = No effort against gravity  4 = No movement.   9 = UNTESTABLE(Joint fused/limb amputated)     5b.  0 Motor right arm:     0 = No drift; limb holds 90 (or 45) degrees for full 10 seconds.   1 = Drift  2 = Some effort against gravity  3 = No effort against gravity  4 = No movement.   9 = UNTESTABLE(Joint fused/limb amputated)     6a. 0 Motor left leg:     0 = No drift; leg holds 30-degree position for full 5 seconds.   1 = Drift   2 = Some effort against gravity  3 = No effort against gravity  4 = No movement.   9 = UNTESTABLE(Joint fused/lim amputated)       6b.  0 Motor right leg:      0 = No drift; leg holds 30-degree position for full 5 seconds.   1 = Drift  2 = Some effort against gravity  3 = No effort against gravity  4 = No movement.   9 = UNTESTABLE(Joint fused/lim amputated)     7. 0 Limb Ataxia:     0 = No Ataxia  1 = Present in 1 limb.   2 = Present in 2 limbs.         8.  1 Sensory:  Use pinprick to test arms/legs/trunk/face,compare side to side.   0 = Normal; no sensory loss.   1 = Mild-to-moderate  sensory loss  2 = Severe to total sensory loss     9. 0 Best Language:   Describe picture/name items/read sentences.   0 = No aphasia; normal.  1 = Mild-to-moderate aphasia  2 = Severe aphasia  3 = Mute       10. 0 Dysarthria:   (Read several words)     0 = Normal Articulation  1 = Mild-to-moderate slurring  2 = near unintelligible or unalbe to speak  9 = Intubated or other physical barrier   11.  0 Extinction and Inattention:     0 = Normal  1 = Inattention or extinction to bilateral simultaneous stimulation in one sensory modality  2 = Severe Hemi-inattention or Hem-attention to more than one modality            Total SCORE:  2

## 2011-11-20 NOTE — ED Nurses Note (Signed)
Gave report to nurse.  Ending assignment on pt.

## 2011-11-20 NOTE — ED Nurses Note (Signed)
Patient continues to rest in bed with no changes.

## 2011-11-20 NOTE — H&P (Addendum)
[facility]  NIH Stroke Scale    Inital assessment  Date of EXAM: 11/20/11        Time of EXAM: 4:02 AM  Person Administering Scale: Victorino Sparrow, DO      1a.  0 Level of consciousness:     0 = Alert; keenly responsive.   1 = Not alert; but arousable by minor stimulation   2 = Not alert; requires repeated stimulation  3 = COMA   1b. 0 LOC questions:   Ask patient month/Their age.   0 = Answers both questions correctly.   1 = Answers one question correctly.   2 = Answers neither question correctly.     1c. 0 LOC commands:  Ask patient to open/close eyes.   0 = OBEYS both tasks correctly.   1 = OBEYS one task correctly.   2 = OBEYS neither task correctly.     2.  0 Best Gaze:  Only horizontal eye movements will be tested.   0 = Normal.   1 = Partial gaze palsy  2 = Forced deviation   3. 0  Visual:   0 = No visual loss.   1 = Partial hemianopia.   2 = Complete hemianopia.   3 = Bilateral hemianopia (blind including cortical blindness).       4. 0 Facial Palsy:  Ask patient to show teeth or raise eyebrows and close eyes. 0 = Normal symmetrical movements.   1 = Minor paralysis (flattened nasolabial fold, asymmetry on smiling).   2 = Partial paralysis (total or near-total paralysis of lower face).   3 = Complete paralysis of one or both sides (absence of facial movement in the upper and lower face).     5a. 1  Motor left arm:     0 = No drift; limb holds 90 (or 45) degrees for full 10 seconds.   1 = Drift  2 = Some effort against gravity  3 = No effort against gravity  4 = No movement.   9 = UNTESTABLE(Joint fused/limb amputated)     5b.  0 Motor right arm:     0 = No drift; limb holds 90 (or 45) degrees for full 10 seconds.   1 = Drift  2 = Some effort against gravity  3 = No effort against gravity  4 = No movement.   9 = UNTESTABLE(Joint fused/limb amputated)     6a. 0 Motor left leg:     0 = No drift; leg holds 30-degree position for full 5 seconds.   1 = Drift  2 = Some effort against gravity   3 = No effort against gravity  4 = No movement.   9 = UNTESTABLE(Joint fused/lim amputated)       6b.  0 Motor right leg:      0 = No drift; leg holds 30-degree position for full 5 seconds.   1 = Drift  2 = Some effort against gravity  3 = No effort against gravity  4 = No movement.   9 = UNTESTABLE(Joint fused/lim amputated)     7. 0 Limb Ataxia:     0 = No Ataxia  1 = Present in 1 limb.   2 = Present in 2 limbs.         8.  1 Sensory:  Use pinprick to test arms/legs/trunk/face,compare side to side.   0 = Normal; no sensory loss.   1 = Mild-to-moderate sensory loss  2 = Severe to total sensory loss     9. 0 Best Language:   Describe picture/name items/read sentences.   0 = No aphasia; normal.  1 = Mild-to-moderate aphasia  2 = Severe aphasia  3 = Mute       10. 0 Dysarthria:   (Read several words)     0 = Normal Articulation  1 = Mild-to-moderate slurring  2 = near unintelligible or unalbe to speak  9 = Intubated or other physical barrier   11.  0 Extinction and Inattention:     0 = Normal  1 = Inattention or extinction to bilateral simultaneous stimulation in one sensory modality  2 = Severe Hemi-inattention or Hem-attention to more than one modality            Total SCORE:  2

## 2011-11-20 NOTE — ED Nurses Note (Signed)
Patient TPA infusion completed.  Patient states while numbness is still present that it is much less than it was.  Patient has increased control on left upper extremity.  Patient concious and alert.  Patient pupils PERRLA and at 4mm.  Patient resting in bed at this time.

## 2011-11-20 NOTE — ED Nurses Note (Signed)
Patient arrived in ED room 14 by [facility].  Patient awake and normal at 0115.  Patient stating feel like she fell asleep sometime between 0130 and 0200.  Patient awoke from sleep with left hand weakness and numbness.  Patient presents to ED with weakness in left leg and hand. Patient consious and alert.  Patient began with facial numbness during neurological assessment.  Patient denied and dizziness. Patient also complains of headache that had been ongoing for 2 weeks prior to other symptoms.

## 2011-11-20 NOTE — ED Attending Handoff Note (Signed)
I assumed care at shift change from Dr. Collene Mares.  The patient has remained stable.  Disposition of patient is to admit to hospital.    Got TPA

## 2011-11-21 ENCOUNTER — Inpatient Hospital Stay (HOSPITAL_COMMUNITY): Payer: MEDICAID

## 2011-11-21 ENCOUNTER — Encounter (HOSPITAL_COMMUNITY): Payer: Self-pay

## 2011-11-21 LAB — BASIC METABOLIC PANEL
ANION GAP: 9 mmol/L (ref 5–16)
BUN/CREAT RATIO: 8 (ref 6–22)
BUN: 5 mg/dL — ABNORMAL LOW (ref 6–20)
CALCIUM: 9 mg/dL (ref 8.5–10.4)
CARBON DIOXIDE: 18 mmol/L — ABNORMAL LOW (ref 22–32)
CHLORIDE: 112 mmol/L — ABNORMAL HIGH (ref 96–111)
CREATININE: 0.61 mg/dL (ref 0.49–1.10)
ESTIMATED GLOMERULAR FILTRATION RATE: >59 ml/min/1.73m2 (ref >59)
GLUCOSE,NONFAST: 93 mg/dL (ref 65–139)
SODIUM: 140 mmol/L (ref 136–145)

## 2011-11-21 LAB — ARTERIAL BLOOD GAS/CO-OX
%FIO2: 21 % (ref 21–100)
BASE DEFICIT: 6.3 mmol/L — ABNORMAL HIGH (ref 0.0–3.0)
BICARBONATE: 20 mmol/L (ref 18.0–29.0)
CARBOXYHEMOGLOBIN: 0.6 % (ref 0.0–2.5)
HEMOGLOBIN: 13.2 g/dL (ref 12.0–16.0)
MET-HEMOGLOBIN: 1.1 % (ref 0.0–3.0)
O2CT: 17.9 (ref 15.7–21.6)
OXYHEMOGLOBIN: 95.5 % (ref 85.0–98.0)
PCO2: 32 mm Hg — ABNORMAL LOW (ref 33.1–43.1)
PH: 7.36 (ref 7.350–7.450)
PIO2/FIO2 RATIO: 510 (ref >300)
PO2: 107 mm Hg — ABNORMAL HIGH (ref 72–100)

## 2011-11-21 LAB — PHOSPHORUS: PHOSPHORUS: 3.1 mg/dL (ref 2.4–4.7)

## 2011-11-21 LAB — CBC/DIFF
BASOPHILS: 0 %
BASOS ABS: 0 10*3/uL (ref 0.0–0.2)
EOS ABS: 0 10*3/uL (ref 0.0–0.5)
EOSINOPHIL: 0 %
HCT: 37.9 % (ref 33.5–45.2)
HGB: 12.8 g/dL (ref 11.2–15.2)
LYMPHOCYTES: 42 %
LYMPHS ABS: 3.3 THOU/uL (ref 1.5–3.5)
MCH: 31.6 pg (ref 27.4–33.0)
MCHC: 33.8 g/dL (ref 32.5–35.8)
MCV: 93.7 fL (ref 78–100)
MONOCYTES: 6 %
MPV: 9.6 fL (ref 7.5–11.5)
PLATELET COUNT: 250 10*3/uL (ref 140–450)
PMN ABS: 4.1 10*3/uL (ref 2.0–6.5)
PMN'S: 52 %
RBC: 4.05 MIL/uL (ref 3.63–4.92)
RDW: 13.9 % (ref 12.0–15.0)
WBC: 7.6 THOU/uL (ref 3.5–11.0)

## 2011-11-21 LAB — PERFORM POC WHOLE BLOOD GLUCOSE: GLUCOSE, POINT OF CARE: 93 mg/dL (ref 70–105)

## 2011-11-21 LAB — MAGNESIUM: MAGNESIUM: 2 mg/dL (ref 1.7–2.5)

## 2011-11-21 MED ORDER — KETOROLAC 30 MG/ML (1 ML) INJECTION SOLUTION
30.0000 mg | Freq: Once | INTRAMUSCULAR | Status: AC
Start: 2011-11-21 — End: 2011-11-21
  Administered 2011-11-21: 30 mg via INTRAVENOUS
  Filled 2011-11-21: qty 1

## 2011-11-21 MED ORDER — PROPRANOLOL ER 60 MG CAPSULE,24 HR,EXTENDED RELEASE
60.0000 mg | ORAL_CAPSULE | Freq: Every day | ORAL | Status: DC
Start: 2011-11-21 — End: 2011-11-22
  Administered 2011-11-21: 60 mg via ORAL
  Administered 2011-11-22: 0 mg via ORAL
  Administered 2011-11-22: 60 mg via ORAL
  Filled 2011-11-21 (×3): qty 1

## 2011-11-21 MED ORDER — ASPIRIN 81 MG CHEWABLE TABLET
81.0000 mg | CHEWABLE_TABLET | Freq: Every day | ORAL | Status: DC
Start: 2011-11-21 — End: 2011-11-22
  Administered 2011-11-21 – 2011-11-22 (×2): 81 mg via ORAL
  Administered 2011-11-22: 0 mg via ORAL
  Filled 2011-11-21 (×2): qty 1

## 2011-11-21 MED ORDER — LEVOTHYROXINE 25 MCG TABLET
25.0000 ug | ORAL_TABLET | Freq: Every morning | ORAL | Status: DC
Start: 2011-11-22 — End: 2011-11-22
  Administered 2011-11-22: 0 ug via ORAL
  Filled 2011-11-21 (×2): qty 1

## 2011-11-21 MED ORDER — MAGNESIUM OXIDE 400 MG (241.3 MG MAGNESIUM) TABLET
400.0000 mg | ORAL_TABLET | Freq: Two times a day (BID) | ORAL | Status: DC
Start: 2011-11-21 — End: 2011-11-22
  Administered 2011-11-21 – 2011-11-22 (×2): 400 mg via ORAL
  Administered 2011-11-22: 0 mg via ORAL
  Filled 2011-11-21 (×3): qty 1

## 2011-11-21 MED ORDER — SODIUM CHLORIDE 0.9 % INTRAVENOUS SOLUTION
INTRAVENOUS | Status: DC
Start: 2011-11-21 — End: 2011-11-22

## 2011-11-21 MED ORDER — ASPIRIN 300 MG RECTAL SUPPOSITORY
300.0000 mg | Freq: Every day | RECTAL | Status: DC
Start: 2011-11-21 — End: 2011-11-21
  Filled 2011-11-21: qty 1

## 2011-11-21 MED ORDER — GADOPENTETATE DIMEGLUMINE 10 MMOL/20 ML(469.01 MG/ML) INTRAVENOUS SOLN
20.00 mL | INTRAVENOUS | Status: AC
Start: 2011-11-21 — End: 2011-11-21
  Administered 2011-11-21: 20 mL via INTRAVENOUS

## 2011-11-21 MED ORDER — PRAVASTATIN 40 MG TABLET
40.00 mg | ORAL_TABLET | Freq: Every evening | ORAL | Status: DC
Start: 2011-11-21 — End: 2011-11-22
  Administered 2011-11-21: 40 mg via ORAL
  Filled 2011-11-21 (×2): qty 1

## 2011-11-21 MED ORDER — ALPRAZOLAM 0.5 MG TABLET
1.0000 mg | ORAL_TABLET | Freq: Two times a day (BID) | ORAL | Status: DC
Start: 2011-11-21 — End: 2011-11-22
  Administered 2011-11-21 – 2011-11-22 (×3): 1 mg via ORAL
  Administered 2011-11-22: 0 mg via ORAL
  Filled 2011-11-21 (×3): qty 2

## 2011-11-21 NOTE — Nurses Notes (Signed)
Paged Dr. Warren Danes to inform that pt is C/O of pain to head.  Pt states she has a hx of migraine headaches and takes medications to prevent the headaches daily as well as Imitrex when she feels one coming on, pt does not have any of her home medications ordered that she takes for migraines at this time.  Also informed MD that pt also does not have her home synthroid , zyrtec, xanax, or Ambien ordered either.  Informed MD that pt has been taking po Xanax 1 mg tid prn at home for 15 years now.  I already informed pt that MD would probably not want to order both Xanax and Ambien together.  MD states will look at pt home medication lists and place orders, however, she is in ED right now and will probably be there for a little bit.  Informed pt of MD response, pt states she is fine to wait on medications for until MD able to finish up in ED. Will monitor

## 2011-11-21 NOTE — Progress Notes (Addendum)
[facility]  Neurology Progress Note      Teresa, Ramirez, 51 y.o. female  Date of Admission:  11/20/2011  Date of service: 11/21/2011  Date of Birth:  01-21-1961      Chief Complaint: L arm weakness and L hemibody sensory loss  Pt's condition today: improving    Subjective: [name] reports increased strength in her L hand and improved sensation in her face and leg.  Otherwise she is doing well without other complaints.    Vital Signs:  Temp (24hrs) Max:37.1 C (98.8 F)      Systolic (24hrs), Avg:142 mmHg, Min:111 mmHg, Max:156 mmHg    Diastolic (24hrs), Avg:79 mmHg, Min:59 mmHg, Max:92 mmHg    Temp  Avg: 36.9 C (98.5 F)  Min: 36.7 C (98.1 F)  Max: 37.1 C (98.8 F)  Pulse  Avg: 72.1   Min: 61   Max: 103   Resp  Avg: 25.2   Min: 12   Max: 40   SpO2  Avg: 96.8 %  Min: 95 %  Max: 100 %  MAP (Non-Invasive)  Avg: 94.9 mmHG  Min: 80 mmHG  Max: 108 mmHG  Pain Score: 0    Today's Physical Exam:  General:alert  Mental status:Alert and oriented x 3  Memory: Registration, Recall, and Following of commands is normal  Attention: Attention and Concentration are normal  Knowledge: Good  Language and Speech: Normal and Normal  Cranial nerves: Cranial nerves 2-12 are normal  Muscle tone: flaccid L hand  Motor strength:  Normal strength except noted at Left:  0/5 wrist flexion, 0.5 thumb abduction, 2/5 intrinsic hand muscles  Sensory: Sensory exam in the upper and lower extremities is normal except as noted: decreased light touch in the L dorsal hand  Gait: Unable to ambulate due to ICU status  Coordination: Coordination is normal without tremor  Reflexes: Reflexes are 2/2 throughout    Current Medications:    Current Facility-Administered Medications:  aspirin chewable tablet 81 mg 81 mg Oral Daily   paroxetine (PAXIL) tablet 20 mg Oral QAM   mometasone (ASMANEX) 220 mcg per inhalation oral inhaler - "Nursing to administer" 1 INHALATION Inhalation QPM   ondansetron (ZOFRAN) tablet 4 mg Oral Q8H PRN    topiramate (TOPAMAX) tablet 300 mg Oral 2x/day   albuterol 90 mcg per inhalation oral inhaler - "Nursing to administer" 2 Puff Inhalation Q6H PRN   ferrous sulfate 324 mg (elemental IRON 65 mg) tab 324 mg Oral Daily       I/O:  I/O last 24 hours:    Intake/Output Summary (Last 24 hours) at 11/21/11 0700  Last data filed at 11/21/11 0700   Gross per 24 hour   Intake   1725 ml   Output      0 ml   Net   1725 ml     I/O current shift:  04/14 0000 - 04/14 0759  In: 600 [I.V.:600]  Out: -     Labs  Please indicate ordered or reviewed)  Reviewed: I have reviewed all lab results.    Review of reports and notes reveal:  Independent Interpretation of images or specimens:  None to review        Assessment/Plan:  Active Hospital Problems    Diagnosis   . Left arm weakness     51yo female who presented with acute onset of L hemibody numbness and L arm weakness s/p tPA doing well and symptoms improving.    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    Labs/Diagnostic studies ordered: MRI TEE    DVT/PE Prophylaxis: Heparin    Rico Ala, MD 11/21/2011, 7:00 AM        I saw and examined the patient.  I reviewed the resident's note.  I agree with the findings and plan of care as documented in the resident's note.  Any exceptions/additions are edited/noted.    Dion Body, MD 11/21/2011, 10:23 PM

## 2011-11-21 NOTE — PT Evaluation (Signed)
[facility]  Physical Therapy Initial Evaluation    Patient Name: Teresa Ramirez  Date of Birth: September 24, 1960  Height:  170.2 cm (5\' 7" )  Weight: 55.7 kg (122 lb 12.7 oz)  Room/Bed: 952/A  Payor: [place] MEDICAID  Plan: [facility] MEDICAID  Product Type: Medicaid       Date/Time of Admission: 11/20/2011  3:55 AM  Admitting Diagnosis:  CVA (CEREBRAL VASCULAR INFARCTION)      HPI:     Teresa Ramirez is a 51 y.o. female who presents with left sided numbness. She states that she went to bed around 1:15am today (she felt completely fine and was neurologically intact) but then woke up at 1:30am and her left hand was numb. Then after a few minutes her left leg went numb as well. Also had some right sided squeezing chest pain without radiation. First time having these abnormal neurologic sensations. No hx of TIAs or strokes. She then presented to the ED and received tPA at 4:58am. When I went to see her, she stated she was feeling better. Still has numbness in her LUE and LLE but improved (not resolved yet).   Precautions: fall    Past Medical History   Diagnosis Date   . Migraines    . IBS (irritable bowel syndrome)    . Asthma    . Angiomyolipoma of kidney    . Environmental allergies    . Epilepsy    . Hypothyroid    . Insomnia    . HTN    . CVA (cerebrovascular accident)      Past Surgical History   Procedure Date   . Hx hand surgery       reports that she has been smoking Cigarettes.  She has a .3 pack-year smoking history. She has never used smokeless tobacco. She reports that she does not drink alcohol or use illicit drugs.  History   Smoking status   . Current Some Day Smoker -- 0.1 packs/day for 3 years   . Types: Cigarettes   Smokeless tobacco   . Never Used   Comment: "maybe about a pack a month, on and off for 3 years"         Subjective:   Flowsheet Info:    11/21/11 1449   Therapist Pager   PT Assigned/ Pager # [phone]   Mutuality/Individual Preferences    !!What Anxieties, Fears or Concerns Do You Have About Your Health or Care? 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   Lives With parent(s)  (mom)   Living Arrangements house   Home Accessibility no concerns   Functional Level Prior   Ambulation 0-->independent   Transferring 0-->independent   Toileting 0-->independent   Prior Functional Level Comment indep with mboility   Change In Functional Status Since Onset of Current Illness/Injury yes   Pain/Comfort   PreTreatment Rating (Numbers Scale) 0/10 - no pain   PostTreatment Rating (Numbers Scale) 0/10 - no pain   Pain Comment (Pre/Post Treatment Pain) no c/o pain   Cognitive/Perceptual/Neuro Assessments   Orientation oriented x 4   Musculoskeletal Interventions   Activity/Level of Assistance dangled at bedside;up in room   Symptoms Noted During/After Activity none   Positioning HOB up 45 degrees   Coping/Psychosocial Response Assessments   Observed Emotional State accepting;calm;cooperative;pleasant   Verbalized Emotional State acceptance;fear   Coping/Psychosocial Response Interventions   Plan  of Care Reviewed With patient      Pt reports she has been amb to from bathroom as needed.    Patient Goals: Return home      Objective:     Cognition: Alert, Cooperative and Oriented Person, Place and Time  Follows Directions: Complex      ROM  Left Right   Shoulder  [facility]  WFL    Elbow  WFL  WFL    Wrist/Hand  [facility]  [facility]    Hip  [facility]  [facility]    Knee  [facility]  [facility]    Ankle  [facility] WFL       STRENGTH  Left Right   Shoulder  4 (Good) WFL   Elbow  4 (Good) WFL   Wrist/Hand  0 (None) wrist extension [facility]   Hip Flexion [facility]    Extension [facility]   Knee Flexion [facility]    Extension [facility]   Ankle DF [facility] WFL    PF WFL WFL       Bed Mobility: supine to sit supervision and sit to supine supervision  Transfers: sit to stand supervision  Balance: Sitting good, standing fair+  Other:    Sensation:  Pt reports intact sensation to L LE.  L UE sensation diminished to light touch, medially more diminished than laterally.   Amb: deferred at this time secondary to blood draw    Assessment:   Pt cooperative and pleasant.  S for bed mobility and transfers.  Pt reports diminished sensation to L UE, wrist and hand with decreased strength.  Pt reports using L hand for ADLs such as dressing during this stay and its trying to keep it active.  Pt education about continuing this. Pending full amb assessment, pt most likely appropriate for home d/c with mom and OP PT.    Rehab Potential: Good    Problem List: Decreased ROM and Decreased Strength  Barriers to Discharge: none anticipated      Goals:   By d/c  1. Amb >250 ft with LRAD and S  2. Negotiate up/down 1 flight of stairs with S      Discharge Needs:   Equipment Recommendations: None Anticipated and To Be Determined  Further Treatment Recommendations: To be Determined, Home or Outpatient PT (pending amb assessment)    Plan:   Current Intervention:Therapeutic Exercise and Mobility Training   To provide physical therapy services 2-4 days/week until discontinued.    The risks/benefits of therapy have been discussed with the patient/caregiver and he/she is in agreement with the established plan of care.     Therapist:   Daphene Calamity, PT 11/21/2011 2:52 PM  Pager #: [phone]  Evaluation Time: 40 minutes  Time may include review of medical chart, obtaining patient's functional history from patient/family/medical staff/case management/ancillary personnel, collaboration on findings and treatment options (with the above mentioned individuals), re-assessment, and acute care rehabilitation.

## 2011-11-21 NOTE — Care Plan (Signed)
Problem: General Plan of Care(Adult,OB)  Goal: Plan of Care Review(Adult,OB)  The patient and/or their representative will communicate an understanding of their plan of care   Outcome: Ongoing (see interventions/notes)  Pt transferred from IUC to floor bed today in stable condition, pt doing very good.  Pt does still have some weakness to left wrist, however, all other stroke like symptoms have resolved.  Pt C/O pain to head, MD aware and some of pt home medications used to prevent migraines as well as one time dose of IV Toradol ordered, still awaiting arrival of medications from pharmacy.  Pt up and ambulating around room with steady gait on own and tolerating well.  Pt still outstanding for MRI, and is aware of NPO status at 0015 for TEE in am. Pt family has been in with family for few hours today and very supportive to pt and her care.  Education, plan of care, and discharge teaching reviewed with pt and family, pt for possible D/C home tomorrow if MRI and TEE preformed and negative. Safety precautions in place and maintained

## 2011-11-21 NOTE — Nurses Notes (Signed)
Paged Dr.Saligram to inform pt states pain to head getting worst and orders still not in [facility], MD states she is sorry she got called back to ED before she was able to place orders, she will be back to floor in about 10-15 minutes and will place orders right away.  Will monitor

## 2011-11-21 NOTE — Nurses Notes (Signed)
Provided Dr. Warren Danes list of pt home medications that she is not currently prescribed.  MD states will look at chart and then place orders, will monitor

## 2011-11-21 NOTE — Progress Notes (Addendum)
[facility]  NIH Stroke Scale    24hour post treatment assessment  Date of EXAM: 11/21/11       Time of EXAM: 0415  Person Administering Scale: Crista Luria, MD      1a.  0 Level of consciousness:     0 = Alert; keenly responsive.   1 = Not alert; but arousable by minor stimulation   2 = Not alert; requires repeated stimulation  3 = COMA   1b. 0 LOC questions:   Ask patient month/Their age.   0 = Answers both questions correctly.   1 = Answers one question correctly.   2 = Answers neither question correctly.     1c. 0 LOC commands:  Ask patient to open/close eyes.   0 = OBEYS both tasks correctly.   1 = OBEYS one task correctly.   2 = OBEYS neither task correctly.     2.  0 Best Gaze:  Only horizontal eye movements will be tested.   0 = Normal.   1 = Partial gaze palsy  2 = Forced deviation   3. 0  Visual:   0 = No visual loss.   1 = Partial hemianopia.   2 = Complete hemianopia.   3 = Bilateral hemianopia (blind including cortical blindness).       4. 0 Facial Palsy:  Ask patient to show teeth or raise eyebrows and close eyes. 0 = Normal symmetrical movements.   1 = Minor paralysis (flattened nasolabial fold, asymmetry on smiling).   2 = Partial paralysis (total or near-total paralysis of lower face).   3 = Complete paralysis of one or both sides (absence of facial movement in the upper and lower face).     5a. 0  Motor left arm:     0 = No drift; limb holds 90 (or 45) degrees for full 10 seconds.   1 = Drift  2 = Some effort against gravity  3 = No effort against gravity  4 = No movement.   9 = UNTESTABLE(Joint fused/limb amputated)     5b.  0 Motor right arm:     0 = No drift; limb holds 90 (or 45) degrees for full 10 seconds.   1 = Drift  2 = Some effort against gravity  3 = No effort against gravity  4 = No movement.   9 = UNTESTABLE(Joint fused/limb amputated)     6a. 0 Motor left leg:     0 = No drift; leg holds 30-degree position for full 5 seconds.   1 = Drift   2 = Some effort against gravity  3 = No effort against gravity  4 = No movement.   9 = UNTESTABLE(Joint fused/lim amputated)       6b.  0 Motor right leg:      0 = No drift; leg holds 30-degree position for full 5 seconds.   1 = Drift  2 = Some effort against gravity  3 = No effort against gravity  4 = No movement.   9 = UNTESTABLE(Joint fused/lim amputated)     7. 0 Limb Ataxia:     0 = No Ataxia  1 = Present in 1 limb.   2 = Present in 2 limbs.         8.  1 Sensory:  Use pinprick to test arms/legs/trunk/face,compare side to side.   0 = Normal; no sensory loss.   1 = Mild-to-moderate sensory loss  2 = Severe to total sensory loss     9. 0 Best Language:   Describe picture/name items/read sentences.   0 = No aphasia; normal.  1 = Mild-to-moderate aphasia  2 = Severe aphasia  3 = Mute       10. 0 Dysarthria:   (Read several words)     0 = Normal Articulation  1 = Mild-to-moderate slurring  2 = near unintelligible or unalbe to speak  9 = Intubated or other physical barrier   11.  0 Extinction and Inattention:     0 = Normal  1 = Inattention or extinction to bilateral simultaneous stimulation in one sensory modality  2 = Severe Hemi-inattention or Hem-attention to more than one modality            Total SCORE:  1    Crista Luria, MD 11/21/2011, 4:30 AM

## 2011-11-21 NOTE — Nurses Notes (Signed)
Sent Dr. Warren Danes a text page to inform that pt still C/O pain to head and currently has no prn pain medications ordered.  Request order for prn Toradol or other pain medication to aid in headache relief.  Will watch for new orders and continue to monitor

## 2011-11-21 NOTE — Nurses Notes (Signed)
Pt transferred to room 952 via bed in stable condition with family and belongings per orders from SICU.  Report received via voice care.  Pt admitted as a stroke page on 11-20-11 and received TPA, pt only remaining side effect of stroke is slight weakness to left wrist and a few fingers on left hand.  Full assessment per flow sheet.  Pt denies any pain or discomfort at this time.  Pt oriented to room and unit.  Safety precautions in place and maintained, will monitor

## 2011-11-21 NOTE — Care Plan (Signed)
Problem: General Plan of Care(Adult,OB)  Goal: Plan of Care Review(Adult,OB)  The patient and/or their representative will communicate an understanding of their plan of care   Outcome: Ongoing (see interventions/notes)  Pt cooperative and pleasant.  S for bed mobility and transfers.  Pt reports diminished sensation to L UE, wrist and hand with decreased strength.  Pt reports using L hand for ADLs such as dressing during this stay and its trying to keep it active.  Pt education about continuing this. Pending full amb assessment, pt most likely appropriate for home d/c with mom and OP PT.    Rehab Potential: Good    Problem List: Decreased ROM and Decreased Strength  Barriers to Discharge: none anticipated      Goals:   By d/c  1. Amb >250 ft with LRAD and S  2. Negotiate up/down 1 flight of stairs with S  Discharge Needs:   Equipment Recommendations: None Anticipated and To Be Determined  Further Treatment Recommendations: To be Determined, Home or Outpatient PT (pending amb assessment)

## 2011-11-22 ENCOUNTER — Inpatient Hospital Stay (HOSPITAL_COMMUNITY): Payer: MEDICAID

## 2011-11-22 LAB — CBC/DIFF
BASOPHILS: 0 %
EOS ABS: 0 10*3/uL (ref 0.0–0.5)
EOSINOPHIL: 0 %
HCT: 34.4 % (ref 33.5–45.2)
HGB: 11.8 g/dL (ref 11.2–15.2)
LYMPHOCYTES: 40 %
LYMPHS ABS: 3.5 10*3/uL (ref 1.5–3.5)
MCH: 32.7 pg (ref 27.4–33.0)
MCHC: 34.5 g/dL (ref 32.5–35.8)
MCV: 94.8 fL (ref 78–100)
MONOCYTES: 8 %
MONOS ABS: 0.7 10*3/uL (ref 0.3–1.0)
MPV: 9.5 fL (ref 7.5–11.5)
PLATELET COUNT: 238 10*3/uL (ref 140–450)
PMN ABS: 4.5 10*3/uL (ref 2.0–6.5)
PMN'S: 52 %
RBC: 3.63 MIL/uL (ref 3.63–4.92)
RDW: 14.2 % (ref 12.0–15.0)
WBC: 8.6 10*3/uL (ref 3.5–11.0)

## 2011-11-22 LAB — BASIC METABOLIC PANEL, FASTING
ANION GAP: 7 mmol/L (ref 5–16)
BUN/CREAT RATIO: 16 (ref 6–22)
BUN: 9 mg/dL (ref 6–20)
CALCIUM: 8.8 mg/dL (ref 8.5–10.4)
CARBON DIOXIDE: 19 mmol/L — ABNORMAL LOW (ref 22–32)
CHLORIDE: 114 mmol/L — ABNORMAL HIGH (ref 96–111)
CREATININE: 0.58 mg/dL (ref 0.49–1.10)
ESTIMATED GLOMERULAR FILTRATION RATE: >59 ml/min/1.73m2 (ref >59)
GLUCOSE, FASTING: 89 mg/dL (ref 70–105)
POTASSIUM: 3 mmol/L — ABNORMAL LOW (ref 3.5–5.1)
SODIUM: 140 mmol/L (ref 136–145)

## 2011-11-22 LAB — PHOSPHORUS: PHOSPHORUS: 2.8 mg/dL (ref 2.4–4.7)

## 2011-11-22 LAB — MAGNESIUM: MAGNESIUM: 1.9 mg/dL (ref 1.7–2.5)

## 2011-11-22 MED ORDER — PRAVASTATIN 40 MG TABLET
40.00 mg | ORAL_TABLET | Freq: Every evening | ORAL | Status: DC
Start: 2011-11-22 — End: 2019-05-21

## 2011-11-22 MED ORDER — DIPHENHYDRAMINE 50 MG/ML INJECTION SOLUTION
INTRAMUSCULAR | Status: AC
Start: 2011-11-22 — End: 2011-11-22
  Filled 2011-11-22: qty 1

## 2011-11-22 MED ORDER — MIDAZOLAM 1 MG/ML INJECTION SOLUTION
0.50 mg | INTRAMUSCULAR | Status: AC | PRN
Start: 2011-11-22 — End: 2011-11-22
  Administered 2011-11-22: 1 mg via INTRAVENOUS
  Administered 2011-11-22 (×4): 2 mg via INTRAVENOUS

## 2011-11-22 MED ORDER — FENTANYL (PF) 50 MCG/ML INJECTION SOLUTION
INTRAMUSCULAR | Status: AC
Start: 2011-11-22 — End: 2011-11-22
  Filled 2011-11-22: qty 2

## 2011-11-22 MED ORDER — DIPHENHYDRAMINE 50 MG/ML INJECTION SOLUTION
12.50 mg | Freq: Four times a day (QID) | INTRAMUSCULAR | Status: DC | PRN
Start: 2011-11-22 — End: 2011-11-22

## 2011-11-22 MED ORDER — LIDOCAINE HCL 2 % MUCOSAL SOLUTION
Status: AC
Start: 2011-11-22 — End: 2011-11-22
  Filled 2011-11-22: qty 0.75

## 2011-11-22 MED ORDER — MIDAZOLAM 1 MG/ML INJECTION SOLUTION
INTRAMUSCULAR | Status: AC
Start: 2011-11-22 — End: 2011-11-22
  Filled 2011-11-22: qty 4

## 2011-11-22 MED ORDER — SODIUM CHLORIDE 0.9 % INTRAVENOUS SOLUTION
INTRAVENOUS | Status: DC
Start: 2011-11-22 — End: 2011-11-22

## 2011-11-22 MED ORDER — BENZOCAINE 20% METERED DOSE ORAL SPRAY
1.00 | ORAL | Status: AC
Start: 2011-11-22 — End: 2011-11-22
  Administered 2011-11-22: 1 via OROMUCOSAL

## 2011-11-22 MED ORDER — GLYCOPYRROLATE 0.2 MG/ML INJECTION SOLUTION
INTRAMUSCULAR | Status: AC
Start: 2011-11-22 — End: 2011-11-22
  Filled 2011-11-22: qty 1

## 2011-11-22 MED ORDER — LIDOCAINE HCL 2 % MUCOSAL SOLUTION
15.00 mL | Status: AC
Start: 2011-11-22 — End: 2011-11-22
  Administered 2011-11-22: 15 mL via OROMUCOSAL

## 2011-11-22 MED ORDER — ASPIRIN 81 MG CHEWABLE TABLET
81.0000 mg | CHEWABLE_TABLET | Freq: Every day | ORAL | Status: DC
Start: 2011-11-22 — End: 2021-05-28

## 2011-11-22 MED ORDER — FENTANYL (PF) 50 MCG/ML INJECTION SOLUTION
INTRAMUSCULAR | Status: AC
Start: 2011-11-22 — End: 2011-11-22
  Filled 2011-11-22: qty 4

## 2011-11-22 MED ORDER — FENTANYL (PF) 50 MCG/ML INJECTION SOLUTION
12.50 ug | INTRAMUSCULAR | Status: AC | PRN
Start: 2011-11-22 — End: 2011-11-22
  Administered 2011-11-22: 50 ug via INTRAVENOUS
  Administered 2011-11-22: 25 ug via INTRAVENOUS
  Administered 2011-11-22 (×3): 50 ug via INTRAVENOUS

## 2011-11-22 MED ORDER — DIPHENHYDRAMINE 50 MG/ML INJECTION SOLUTION
50.0000 mg | Freq: Once | INTRAMUSCULAR | Status: AC
Start: 2011-11-22 — End: 2011-11-22
  Administered 2011-11-22: 50 mg via INTRAVENOUS

## 2011-11-22 MED ORDER — GLYCOPYRROLATE 0.2 MG/ML INJECTION SOLUTION
200.00 ug | INTRAMUSCULAR | Status: AC
Start: 2011-11-22 — End: 2011-11-22
  Administered 2011-11-22: 200 ug via INTRAVENOUS

## 2011-11-22 NOTE — OT Evaluation (Signed)
 [place]  [facility]  Occupational Therapy Initial Evaluation      Patient Name: Teresa Ramirez  Date of Birth: 09-15-1960  Height:  Height: 170.2 cm (5' 7)  Weight:  Weight: 55.7 kg (122 lb 12.7 oz)  Room/Bed: 952/A       Date/Time of Admission: 11/20/2011  3:55 AM  Admitting Diagnosis: CVA    HPI:     Teresa Ramirez is a 51 y.o. female admitted due to acute onset L UE weakness, R sided squeezing chest pain without radiation, rec'd tPA and admitted for further eval.    Precautions: L wrist and hand weakness    Past Medical History   Diagnosis Date   . Migraines    . IBS (irritable bowel syndrome)    . Asthma    . Angiomyolipoma of kidney    . Environmental allergies    . Epilepsy    . Hypothyroid    . Insomnia    . HTN    . CVA (cerebrovascular accident)      Past Surgical History   Procedure Date   . Hx hand surgery      History   Smoking status   . Current Some Day Smoker -- 0.1 packs/day for 3 years   . Types: Cigarettes   Smokeless tobacco   . Never Used   Comment: maybe about a pack a month, on and off for 3 years       Subjective:   This wrist is really giving me problems    Occupational Profile: 51 year old female previously I with all adl, Iadl and mobility.  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  Enjoys reading, travelling, and computer.      Flowsheet Info:    11/22/11 1100   Mutuality/Individual Preferences   !!What Anxieties, Fears or Concerns Do You Have About Your Health or Care? concern re: L hand and wrist   Functional Level Prior   Ambulation 0-->independent   Transferring 0-->independent   Toileting 0-->independent   Bathing 0-->independent   Dressing 0-->independent   Eating 0-->independent   Communication 0-->understands/communicates without difficulty   Swallowing 0-->swallows foods and liquids without difficulty   Change In Functional Status Since Onset of Current Illness/Injury yes   Self-Care   Dominant Hand right      Pain/Comfort   PreTreatment Rating (Numbers Scale) 0/10 - no pain   PostTreatment Rating (Numbers Scale) 0/10 - no pain   Cognitive/Perceptual/Neuro Assessments   Cognitive/Perceptual/Neuro WDL WDL   Level of Consciousness alert   Orientation oriented x 4   Speech clear   Musculoskeletal Assessments   Musculoskeletal WDL Ex   LUE Extremity Movement active ROM mildly impaired   Musculoskeletal Interventions   Activity/Level of Assistance up in room;bathroom;chair;dangled at bedside   Positioning independent   Joint Mobility Promotion other (see comments)  (L wrist cock up splint provided to stabilize wrist )   Coping/Psychosocial Response Assessments   Observed Emotional State accepting   Coping/Psychosocial Response Interventions   Plan of Care Reviewed With patient              Objective:     Cognition   Cognition: alert and cooperative   Orientation: person, place, time and situation   Attention: WNL   Follows Commands: simple and complex   Short Term Memory: WNL   Long Term Memory: WNL   Problem Solving: WNL   Follow Through: WNL   Initiation:  WNL   Safety: WNL     Perception   R/L Discrimination: WNL   Body Scheme: WNL     Vision   Visual Tracking: fluent   Visual Correction: none   Vision Neglect: None   Hemianopsia: None     ROM R UE arom wnls, L UE arom wnls except wrist extension and finger extension due to weakness    Strength B UEs WNLs except L wrist 2/5 in extension, L fingers 2-/5 in extension       Grip L/R wfls    Upper Extremity Motor:    Tone: Normal  Fine Motor Control: Impaired    Gross Motor Control: WFL  Ataxia: no  Spontaneous Movement: yes    Sensory:   Light Touch: WFL     Endurance:  Endurance: Good    Activities of Daily Living (ADL's)  Feeding: independent   Grooming: set up assist      Bathing: Upper Extremity - not able to assess    Lower Extremity - not able to assess   Dressing: Upper Extremity - set up assist       Lower Extremity - set up assist      Toileting: independent      Instrumental Activities of Daily Living (IADL's)  Not tested.    Functional Assessment  Posture and Positioning: midline except wrist drop L  Sitting Balance: Dynamic Good  Standing Balance: Dynamic Fair +  Bed Mobility: I  Functional Transfers: I  Toilet Transfer: I  Tub/Shower Transfer: NT  Functional Activity: Pt sized and fitted with L wrist cock up splint, engaged in fine motor and adl while wearing splint with increased ease and efficiency.    Pain: none    Assessment:   Pt tolerated OT eval without difficulty.  Presents more like nerve palsy to the L UE rather than CVA.  Provided pt with wrist cock up splint to stabilize L wrist during functional activity. Further outpt assessment recommended including EMG and outpt PT/OT.    Rehab Potential: Good    Problem List: ADL's, IADL's, strength, ROM and fine Motor  Barriers to Discharge: none  Patient's Goal: Pts goal is to return home.      Goals for OT plan of care:   None est in this setting- pt to be d/c'd per MD.          Discharge Needs:   Equipment Recommendations: none  Further Treatment Recommendations: Outpatient OT or Outpatient PT    Plan:   No further OT indicated in this setting.  Pt issued splint and recommended f/u via outpt.    The risks/benefits of therapy have been discussed with the patient/caregiver and he/she is in agreement with the established plan of care.     Therapist:   Wanda Pouch, OTR/L 11/22/2011 11:47 AM  Pager #:[phone]  Evaluation Time: 55 minutes  Time may include review of medical chart, obtaining patient's functional history from patient/family/medical staff/case management/ancillary personnel, collaboration on findings and treatment options (with the above mentioned individuals), re-assessment, and acute care rehabilitation.

## 2011-11-22 NOTE — Care Plan (Signed)
 Problem: General Plan of Care(Adult,OB)  Goal: Plan of Care Review(Adult,OB)  The patient and/or their representative will communicate an understanding of their plan of care   Outcome: Ongoing (see interventions/notes)  Speech and Swallow: Order received and acknowledged, chart review completed. Pt currently off the floor for TEE. Pt will remain NPO following TEE for 2 hours. Will follow up later this afternoon, as pt is appropriate. RN aware. Thank you for this consult.     Lemoyne Nestor  Beeper: [phone]

## 2011-11-22 NOTE — Nurses Notes (Signed)
Pt being discharge to home with family and belongings in stable condition per orders.  IV and Teley removed per protocol orders.  Discharge teaching and instructions reviewed with pt and mother by Beverely Pace RN.  Spoke with Archie Patten PA of of Neuro and ask to speak with pt regarding dangers of birth control pills and smoking, as well as education on migraine headaches.  Pt nor family have any other questions or concerns at this time.  Pt awaiting arrival of transport assistant and wheelchair in her room, while her mother goes to get the car.

## 2011-11-22 NOTE — Progress Notes (Addendum)
[facility]  Neurology Progress Note      Zamantha, Strebel, 51 y.o. female  Date of Admission:  11/20/2011  Date of service: 11/22/2011  Date of Birth:  [birth year]      Chief Complaint: L arm weakness and L hemibody sensory loss  Pt's condition today: improving    Subjective: Toady she has no new complaints. Her foot drop has improved as has her wrist drop.    Vital Signs:  Temp (24hrs) Max:37.2 C (99 F)      Systolic (24hrs), Avg:143 mmHg, Min:129 mmHg, Max:152 mmHg    Diastolic (24hrs), Avg:84 mmHg, Min:77 mmHg, Max:93 mmHg    Temp  Avg: 36.9 C (98.4 F)  Min: 36.7 C (98.1 F)  Max: 37.2 C (99 F)  Pulse  Avg: 76.1   Min: 63   Max: 92   Resp  Avg: 23.8   Min: 18   Max: 35   SpO2  Avg: 97.1 %  Min: 96 %  Max: 99 %  MAP (Non-Invasive)  Avg: 101.8 mmHG  Min: 96 mmHG  Max: 111 mmHG  Pain Score: 9    Today's Physical Exam:  General:alert   Mental status:Alert and oriented x 3   Memory: Registration, Recall, and Following of commands is normal   Attention: Attention and Concentration are normal   Knowledge: Good   Language and Speech: Normal and Normal   Cranial nerves: Cranial nerves 2-12 are normal   Muscle tone: left hand tone improved.   Motor strength: Normal strength except noted at Left: 2/5 wrist flexion, 1/5 thumb abduction, 2/5 intrinsic hand muscles   Sensory: Sensory exam in the upper and lower extremities is normal except as noted: decreased light touch in the L dorsal hand   Coordination: Coordination is normal without tremor   Reflexes: Reflexes are 2/2 throughout    Current Medications:    Current Facility-Administered Medications:  NS premix infusion  Intravenous Continuous   aspirin chewable tablet 81 mg 81 mg Oral Daily   pravastatin (PRAVACHOL) tablet 40 mg Oral QPM   levothyroxine (SYNTHROID) tablet 25 mcg Oral QAM   alprazolam (XANAX) tablet 1 mg Oral 2x/day   propranolol (INDERAL LA) 24 hr extended release capsule 60 mg Oral Daily    magnesium oxide (MAG-OX) tablet 400 mg Oral 2x/day   paroxetine (PAXIL) tablet 20 mg Oral QAM   mometasone (ASMANEX) 220 mcg per inhalation oral inhaler - "Nursing to administer" 1 INHALATION Inhalation QPM   ondansetron (ZOFRAN) tablet 4 mg Oral Q8H PRN   topiramate (TOPAMAX) tablet 300 mg Oral 2x/day   albuterol 90 mcg per inhalation oral inhaler - "Nursing to administer" 2 Puff Inhalation Q6H PRN   ferrous sulfate 324 mg (elemental IRON 65 mg) tab 324 mg Oral Daily       I/O:  I/O last 24 hours:    Intake/Output Summary (Last 24 hours) at 11/22/11 0649  Last data filed at 11/22/11 1610   Gross per 24 hour   Intake   1560 ml   Output      0 ml   Net   1560 ml     I/O current shift:  04/15 0000 - 04/15 0759  In: 735 [P.O.:360; I.V.:375]  Out: -     Labs  Please indicate ordered or reviewed)  Reviewed:   Lab Results for Last 24 Hours:    Results for orders placed during the hospital encounter of 11/20/11 (from the past 24 hour(s))   CBC/DIFF  Component Value Range    WBC 8.6  3.5 - 11.0 THOU/uL    RBC 3.63  3.63 - 4.92 MIL/uL    HGB 11.8  11.2 - 15.2 g/dL    HCT 16.1  09.6 - 04.5 %    MCV 94.8  78 - 100 fL    MCH 32.7  27.4 - 33.0 pg    MCHC 34.5  32.5 - 35.8 g/dL    RDW 40.9  81.1 - 91.4 %    PLATELET COUNT 238  140 - 450 THOU/uL    MPV 9.5  7.5 - 11.5 fL    PMN'S 52      PMN ABS 4.500  2.0 - 6.5 THOU/uL    LYMPHOCYTES 40      LYMPHS ABS 3.500  1.5 - 3.5 THOU/uL    MONOCYTES 8      MONOS ABS 0.700  0.3 - 1.0 THOU/uL    EOSINOPHIL 0      EOS ABS 0.000  0.0 - 0.5 THOU/uL    BASOPHILS 0      BASOS ABS 0.000  0.0 - 0.2 THOU/uL   BASIC METABOLIC PANEL, FASTING       Component Value Range    SODIUM 140  136 - 145 mmol/L    POTASSIUM 3.0 (*) 3.5 - 5.1 mmol/L    CHLORIDE 114 (*) 96 - 111 mmol/L    CARBON DIOXIDE 19 (*) 22 - 32 mmol/L    ANION GAP 7  5 - 16 mmol/L    CREATININE 0.58  0.49 - 1.10 mg/dL    ESTIMATED GLOMERULAR FILTRATION RATE >59  >59 ml/min/1.53m2    GLUCOSE, FASTING 89  70 - 105 mg/dL     BUN 9  6 - 20 mg/dL    BUN/CREAT RATIO 16  6 - 22    CALCIUM 8.8  8.5 - 10.4 mg/dL   MAGNESIUM       Component Value Range    MAGNESIUM 1.9  1.7 - 2.5 mg/dL   PHOSPHORUS       Component Value Range    PHOSPHORUS 2.8  2.4 - 4.7 mg/dL       Review of reports and notes reveal:     Independent Interpretation of images or specimens:  MRI brain negative for any acute abnormality    Patient/ Family Discussion: discussed plan of care with patient    Assessment/Plan:  51yo female who presented with acute onset of L hemibody numbness and L arm weakness s/p tPA doing well and symptoms improving.   -- Patient's foot drop has almost resolved. Her wrist drop is much improved.  -- MRI brain is negative for infarct, hemorrhage or any other structural lesion.  -- TEE done and preliminary report is negative for PFO, cardiac source of emboli.  -- continue ASA 81 mg    Dyslipidemia asthma hypothyroidism  -- continue Pravastatin and the remaining home meds.    Foot drop and wrist Drop  -- foot drop has alsmost resolved, she has had a prior episode of foot drop  -- wrist drop has improved, is able to extend wrist against gravity.  -- will get EMG as an out patient     PT/OT  -- recommended out patient PT    Will discharge today.      Active Hospital Problems    Diagnosis   . Left arm weakness         DVT/PE Prophylaxis: Heparin    Juliane Poot, MD  11/22/2011, 6:49 AM        Late entry for 11/22/11. I saw and examined the patient.  I reviewed the resident's note.  I agree with the findings and plan of care as documented in the resident's note.  Any exceptions/additions are edited/noted.    Dion Body, MD 11/23/2011, 9:40 PM

## 2011-11-22 NOTE — Care Plan (Signed)
Problem: General Plan of Care(Adult,OB)  Goal: Plan of Care Review(Adult,OB)  The patient and/or their representative will communicate an understanding of their plan of care   Outcome: Adequate for Discharge Date Met:  11/22/11  All goals adequate for discharge to home with family and belongings in stable condition per orders.  Pt only residual side effect is weakness to left wrist, pt has order for out patient PT to aid in rehab for wrist.  Pt able to ambulate on own with steady gait.  Pt aware that she is to follow up with Neurology in 6 weeks.

## 2011-11-22 NOTE — Care Plan (Signed)
Problem: General Plan of Care(Adult,OB)  Goal: Plan of Care Review(Adult,OB)  The patient and/or their representative will communicate an understanding of their plan of care   Outcome: Ongoing (see interventions/notes)  Discharge Plan:  Home(Patient/Family Member/other) (code 1)  Rounds completed with Neurology Team this a.m.  Patient for probable discharge to home today after echo with no identified discharge needs.  Rehab evaluation indicated possible Outpatient therapy. Will follow and assist as needed.

## 2011-11-22 NOTE — Nurses Notes (Signed)
Pt returned from TEE to room via bed in stable condition.  Pt tolerated procedure well per report.  Probe was removed at 1010 am, so pt is to remain NPO until 1210, pt is aware.  Full assessment per flow sheet. Pt denies pain or discomfort at this time, will monitor

## 2011-11-22 NOTE — Progress Notes (Addendum)
[facility]   PROCEDURE NOTE   Loucile Posner  [id number]  11/22/2011  Procedure: TEE   Operators:  Caralyn Guile (Staff), Dar (Fellow)  Sedation:  9 mg Versed, 225 mcg Fentanyl, 50 mg Diphenhydramine  Findings:  Normal EF, no LA or LAA thrombus, no PFO or ASD, full report to follow.    Micael Hampshire, MD 11/22/2011, 11:25 AM    I was present and supervised/observed the entire TEE procedure.    Carmie End, MD 11/22/2011, 11:41 AM

## 2011-11-22 NOTE — Nurses Notes (Signed)
Pt Gag reflex returned, pt with orders for discharge to home.  Pt states does not want to eat lunch here before leaves, will stop and get food on way home.  Will monitor

## 2011-11-22 NOTE — Care Plan (Signed)
Problem: General Plan of Care(Adult,OB)  Goal: Plan of Care Review(Adult,OB)  The patient and/or their representative will communicate an understanding of their plan of care   Outcome: Adequate for Discharge Date Met:  11/22/11     Assessment:   Pt tolerated OT eval without difficulty.  Presents more like nerve palsy to the L UE rather than CVA.  Provided pt with wrist cock up splint to stabilize L wrist during functional activity. Further outpt assessment recommended including EMG and outpt PT/OT.    Rehab Potential: Good    Problem List: ADL's, IADL's, strength, ROM and fine Motor  Barriers to Discharge: none  Patient's Goal: Pts goal is to return home.  Goals for OT plan of care:   None est in this setting- pt to be d/c'd per MD.          Discharge Needs:   Equipment Recommendations: none  Further Treatment Recommendations: Outpatient OT or Outpatient PT      Plan:   No further OT indicated in this setting.  Pt issued splint and recommended f/u via outpt.    The risks/benefits of therapy have been discussed with the patient/caregiver and he/she is in agreement with the established plan of care.   Therapist:   Pershing Proud, OTR/L 11/22/2011 11:47 AM  Pager #:[phone]

## 2011-11-22 NOTE — Nurses Notes (Signed)
Pt transported to cardiology via bed for TEE in stable condition per orders.

## 2011-11-22 NOTE — Care Management Notes (Signed)
 [place]  [facility]  Care Management Note    Patient Name: Teresa Ramirez  Date of Birth: 05-18-1961  Sex: female  Date/Time of Admission: 11/20/2011  3:55 AM  Room/Bed: 952/A  Payor: [place] MEDICAID  Plan: [facility] MEDICAID  Product Type: Medicaid     LOS: 2 days   Admitting Diagnosis:  CVA (CEREBRAL VASCULAR INFARCTION)    Subjective/Objective: Discharge Planning    Assessment:    11/22/11 1039   Assessment Details   Assessment Type Admission   Date of Care Management Update 11/22/11   Date of Next DCP Update 11/25/11   Care Management Plan   Discharge Planning Status plan in progress   Projected Discharge Date 11/22/11   CM will evaluate for rehabilitation potential yes   Patient choice offered to patient/family no   Form for patient choice reviewed/signed and on chart no   Initial Interview In lieu of initial interview, CM will monitor for revisions in the clinical/discharge plan of care.   Discharge Needs Assessment   Outpatient/Agency/Support Group Needs none   Equipment Currently Used At Home none   Equipment Needed After Discharge none   Discharge Facility/Level of Care Needs Home (Patient/Family Member/other)(code 1)        Discharge Plan:  Home(Patient/Family Member/other) (code 1)  Rounds completed with Neurology Team this a.m.  Patient for probable discharge to home today after echo with no identified discharge needs.  Rehab evaluation indicated possible Outpatient therapy. Will follow and assist as needed.     The patient will continue to be evaluated for developing discharge needs.     Case Manager: Tawni Velia Reding, RN 11/22/2011, 10:40 AM  Phone: [phone]

## 2011-11-22 NOTE — Care Plan (Signed)
Problem: General Plan of Care(Adult,OB)  Goal: Plan of Care Review(Adult,OB)  The patient and/or their representative will communicate an understanding of their plan of care   Outcome: Ongoing (see interventions/notes)  Pt had MRI this evening. Pt for TEE today. Pt for possible D/C home today if MRI and TEEnegative. Safety precautions in place and maintained

## 2011-11-22 NOTE — Nurses Notes (Signed)
0840 patient arrived to TEE room; MAE equal; resp even and nonlabored on RA: alert and oriented; 0910 Dr. Laurine Blazer at bedside to explain procedure and risks to patient; 0915 consent signed; throat numbed; 0945 probe inserted; patient tolerating well; 0957 bubble study complete; 1000 probe removed; study complete; patient tolerated well; MAE equal; resp even and nonlabored on RA: slightly drowsy but is appropriate and follows; 1008 report called to nurse; 1015 patient transported from TEE room

## 2011-11-27 NOTE — Discharge Summary (Addendum)
DISCHARGE SUMMARY      PATIENT NAME:  Marga, Gramajo  MRN:  [id number]  DOB:  1960/11/24    ADMISSION DATE:  11/20/2011  DISCHARGE DATE:  11/22/11    ATTENDING PHYSICIAN: Dion Body, MD  PRIMARY CARE PHYSICIAN: Joan Mayans, MD     ADMISSION DIAGNOSIS: stroke  DISCHARGE DIAGNOSIS:  1. 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 Left arm weakness 11/20/2011      Resolved Hospital Problems    Diagnosis    No resolved problems to display.     Active Non-Hospital Problems    Diagnosis Date Noted   . Left arm weakness 11/20/2011   . 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 History of recurrent UTIs 01/06/2010   . Asthma 09/24/1997   . Migraine Headaches 09/24/1997   . Seizures 09/24/1997   . Hypertension 09/13/1995      DISCHARGE MEDICATIONS:  Discharge Medication List as of 11/22/2011 12:27 PM      START taking these medications    Details   aspirin 81 mg Oral Tablet, Chewable Take 1 Tab (81 mg total) by mouth Once a day, Disp-30 Tab, R-3, E-Rx      pravastatin (PRAVACHOL) 40 mg Oral Tablet Take 1 Tab (40 mg total) by mouth QPM, Disp-30 Tab, R-3, E-Rx         CONTINUE these medications which have NOT CHANGED    Details   levothyroxine (SYNTHROID) 25 mcg Oral Tablet Take 25 mcg by mouth Once a day, Historical Med      ALPRAZolam (XANAX) 1 mg Oral Tablet Take 1 mg by mouth Three times a day as needed, Historical Med      paroxetine (PAXIL) 20 mg Oral Tablet take 1 Tab by mouth QAM., Disp-2 Tab, R-0, No Print      trazodone (DESYREL) 50 mg Oral Tablet take 2 Tabs by mouth Every night as needed., Disp-60 Tab, R-5, Print      propranolol (INDERAL LA) 60 mg Oral Capsule,Sustained Action 24 hr take 1 Cap by mouth Once a day., Disp-60 Cap, R-3, Print      ferrous sulfate (IRON) 325 mg (65 mg iron) Oral Tablet take 325 mg by mouth Once a day.  , Historical Med       fluticasone (FLOVENT HFA) 110 mcg/Actuation Aero oral inhaler take 1 Puff by inhalation Twice daily., Historical Med      topiramate (TOPAMAX) 100 mg Tab 3 bidOral, starting 12/26/2008, Disp-180, R-11, E-Rx      Butorphanol Tartrate (STADOL) 10 mg/mL Spry As neededNasal, Historical Med      Sumatriptan Succinate (IMITREX) 100 mg Tab As neededOral, Historical Med      Zolpidem (AMBIEN) 10 mg Tab 1 qhsOral, Historical Med      NECON 1/50 (28) ORAL 1 dailyOral, Historical Med      VENTOLIN INHL As neededInhalation, Historical Med      ondansetron (ZOFRAN) 4 mg Tab As neededOral, Historical Med      ZYRTEC-D PO Take by mouth BID, Historical Med           DISCHARGE INSTRUCTIONS:     [facility] FOLLOW-[facility]   Follow-up appointment clinic: NEUROLOGY    Follow-up in: 6 WEEKS    Reason for visit: HOSPITAL DISCHARGE    Followup reason: hospital discharge    Provider: saligram      PHYSICAL THERAPY OUTPATIENT EXTERNAL REFERRAL  PT Need to be addressed: OTHER foot drop and wrist drop     NCS/EMG   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 Patient has had Foot drop before.   Does the patient have defibrillator or pacemaker? None    Extremities: Both    Side: Left         REASON FOR HOSPITALIZATION AND HOSPITAL COURSE: Honi Name is a 51 y.o., White female who presented with left hand weakness, and numbness on left side of face, left hand, and left leg. She went to bed at 1:15 the day of admission with a mild headache and awoke 15 minutes later feeling numbness on her left face, arm and leg, left wrist drop and sharp right sided chest pain. She has a history of migraines, seizures (generailized tonic-clonic, last seizure in 2008) for which she takes topamax, and history of left foot drop that has resolved. Also history of right hand fracture that has hardware in place; was in 2007 at a hospital in [place], doesn't remember the name. She never has paraesthesias with her migraines.    Over the hospital course she had a CTSP which was negative for stroke and negative for any perfusion abnormality. She received IV-tPA and was admitted to MICU for observation for close observation for 24 hours and then was transferred to the Neurology service. Her symptoms slowly resolved. She had an MRI brain which was negative for any restricted diffusion compatible with stroke. She had a TEE to evaluate for PFO and thrombus and this was negative. She was found to have dyslipidemia and was started on pravastatin, she was also started on Aspirin 81 mg. She received PT/OT while in the hospital. They recommended out patient PT/OT for left wrist drop. She did not have a repeat of her symptoms and no new neurological symptoms. She will get an EMG/NCS as an out patient for her wrist and foot drop. She has a follow-up appointment in Neurology clinic.  Neuro Exam on Discharge  General:alert   Mental status:Alert and oriented x 3   Memory: Registration, Recall, and Following of commands is normal   Attention: Attention and Concentration are normal   Knowledge: Good   Language and Speech: Normal and Normal   Cranial nerves: Cranial nerves 2-12 are normal   Muscle tone: left hand tone improved.   Motor strength: Normal strength except noted at Left: 2/5 wrist flexion, 2/5 thumb abduction, 2/5 intrinsic hand muscles   Sensory: Sensory exam in the upper and lower extremities is normal except as noted: decreased light touch in the L dorsal hand   Coordination: Coordination is normal without tremor   Reflexes: Reflexes are 2/2 throughout    CONDITION ON DISCHARGE:  A. Ambulation: Full ambulation  B. Self-care Ability: Complete  C. Cognitive Status Alert and Oriented x 3    DISCHARGE DISPOSITION:  Home discharge     cc: Primary Care Physician:  Joan Mayans, MD  [place] [facility] [address]  [facility] [address]     [id number] Physician:  No referring provider defined for this encounter.      Juliane Poot, MD    Referring providers can utilize [email or web address] to access their referred [employer] patient's information.

## 2011-11-28 NOTE — ED Provider Notes (Signed)
 HPI    CC: Teresa Ramirez is a 51 y.o. female who presents from [facility] as a Stroke Page.     Known HPI: Per EMS report the event occurred approximately at 0030.    Patient was last seen normal at 0030. Per OSH, patient was seen initially for chest pain and left arm numbness, but while in ER symptoms of dysarthria, weakness of LUE specifically at the wrist began. Patient was last normal before going to bed at 0030. CT non-contrast at [facility] negative for acute process.  No other focal neuro symptoms. Patient has history of complicated migraine. Denies drug/alcohol use or sleeping on her arm.     Patient also states tonight she has had some intermittent substernal pressure which is gone at time of arrival to [facility] ED.     PMH:     Past Medical History   Diagnosis Date   . Migraines    . IBS (irritable bowel syndrome)    . Asthma    . Angiomyolipoma of kidney    . Environmental allergies    . Epilepsy    . Hypothyroid    . Insomnia    . HTN    . CVA (cerebrovascular accident)        Medications:   Medications Prior to Admission     Medication    levothyroxine  (SYNTHROID) 25 mcg Oral Tablet    Take 25 mcg by mouth Once a day    ALPRAZolam  (XANAX ) 1 mg Oral Tablet    Take 1 mg by mouth Three times a day as needed    ALPRAZolam  (XANAX ) 0.5 mg Oral Tablet    Take 1 mg by mouth Three times a day as needed    paroxetine  (PAXIL ) 20 mg Oral Tablet    take 1 Tab by mouth QAM.    trazodone  (DESYREL ) 50 mg Oral Tablet    take 2 Tabs by mouth Every night as needed.    propranolol  (INDERAL  LA) 60 mg Oral Capsule,Sustained Action 24 hr    take 1 Cap by mouth Once a day.    ferrous sulfate  (IRON ) 325 mg (65 mg iron ) Oral Tablet    take 325 mg by mouth Once a day.      LEVOTHYROXINE  SODIUM (SYNTHROID ORAL)    Take by mouth Once a day     fluticasone (FLOVENT HFA) 110 mcg/Actuation Aero oral inhaler    take 1 Puff by inhalation Twice daily.    topiramate  (TOPAMAX ) 100 mg Tab    take by mouth. 3 bid     Butorphanol Tartrate (STADOL) 10  mg/mL Spry    by Nasal route. As needed    Sumatriptan Succinate (IMITREX) 100 mg Tab    take by mouth. As needed    Zolpidem (AMBIEN) 10 mg Tab    take by mouth. 1 qhs    NECON 1/50 (28) ORAL    take by mouth. 1 daily    VENTOLIN  INHL    take by inhalation. As needed    ondansetron  (ZOFRAN ) 4 mg Tab    take by mouth. As needed    ZYRTEC-D PO    Take by mouth BID          PSurgHx:   Past Surgical History   Procedure Date   . Hx hand surgery              Review of Systems    REVIEW OF SYSTEMS:    GENERAL/CONSTITUTIONAL: Patient denies fever/chills.  CARDIOVASCULAR: The patient CO chest pain.  RESPIRATORY: Patient denies cough, shortness of breath.  GASTROINTESTINAL: The patient denies nausea or vomiting.  GENITOURINARY: The patient denies pain or burning with urination.  MUSCULOSKELETAL: The patient denies joint pain.  NEUROLOGIC: The patient complains as per HPI.   SKIN: The patient denies rash.    All Other Systems Reviewed as Negative.       History:     PMH:    Past Medical History:  Past Medical History   Diagnosis Date   . Migraines    . IBS (irritable bowel syndrome)    . Asthma    . Angiomyolipoma of kidney    . Environmental allergies    . Epilepsy    . Hypothyroid    . Insomnia    . HTN    . CVA (cerebrovascular accident)        Past Surgical History:  Past Surgical History   Procedure Date   . Hx hand surgery        Social History:  History   Substance Use Topics   . Smoking status: Current Some Day Smoker -- 0.1 packs/day for 3 years     Types: Cigarettes   . Smokeless tobacco: Never Used    Comment: maybe about a pack a month, on and off for 3 years   . Alcohol Use: No     History   Drug Use No     past use of marijuana in college       Family History:  Family History   Problem Relation Age of Onset   . Cancer Neg Hx    . Diabetes Neg Hx    . Heart Attack Maternal Grandfather 70   . Heart Attack Maternal Grandmother 70   . Hypertension Maternal Grandmother    . Hypertension Mother    . Seizures Neg Hx     . Elevated Lipids Mother        No history of family issues with anaesthesia.     Allergies   Allergen Reactions   . Compazine (Prochlorperazine Edisylate) Seizure   . Haldol (Haloperidol)    . Periactin (Cyproheptadine) Anaphylaxis   . Phenergan (Promethazine) Seizure   . Reglan (Metoclopramide) Seizure         Above history reviewed with patient.     Medications Prior to Admission     Medication    levothyroxine  (SYNTHROID) 25 mcg Oral Tablet    Take 25 mcg by mouth Once a day    ALPRAZolam  (XANAX ) 1 mg Oral Tablet    Take 1 mg by mouth Three times a day as needed    ALPRAZolam  (XANAX ) 0.5 mg Oral Tablet    Take 1 mg by mouth Three times a day as needed    paroxetine  (PAXIL ) 20 mg Oral Tablet    take 1 Tab by mouth QAM.    trazodone  (DESYREL ) 50 mg Oral Tablet    take 2 Tabs by mouth Every night as needed.    propranolol  (INDERAL  LA) 60 mg Oral Capsule,Sustained Action 24 hr    take 1 Cap by mouth Once a day.    ferrous sulfate  (IRON ) 325 mg (65 mg iron ) Oral Tablet    take 325 mg by mouth Once a day.      LEVOTHYROXINE  SODIUM (SYNTHROID ORAL)    Take by mouth Once a day     fluticasone (FLOVENT HFA) 110 mcg/Actuation Aero oral inhaler    take 1 Puff  by inhalation Twice daily.    topiramate  (TOPAMAX ) 100 mg Tab    take by mouth. 3 bid     Butorphanol Tartrate (STADOL) 10 mg/mL Spry    by Nasal route. As needed    Sumatriptan Succinate (IMITREX) 100 mg Tab    take by mouth. As needed    Zolpidem (AMBIEN) 10 mg Tab    take by mouth. 1 qhs    NECON 1/50 (28) ORAL    take by mouth. 1 daily    VENTOLIN  INHL    take by inhalation. As needed    ondansetron  (ZOFRAN ) 4 mg Tab    take by mouth. As needed    ZYRTEC-D PO    Take by mouth BID                    Physical Exam    *Nursing Notes and Vital signs reviewed.   CONSTITUTIONAL: No acute distress.   VITAL SIGNS: BP 120/64  Pulse 81  Temp 36.8 C (98.2 F)  Resp 20  Ht 1.702 m (5' 7)  Wt 55.7 kg (122 lb 12.7 oz)  BMI 19.23 kg/m2  SpO2 96%  LMP  11/18/2011  SKIN: No rashes.  HEENT: Pupils 3 mm. Round reactive to light. Gaze and visual fields as above.  NECK: Full range of motion. Supple.  RESPIRATORY: CTAB. No ronchi, wheezing.   CARDIOVASCULAR: RRR. 2+ pulses bilaterally.  ABDOMEN: S, NT, ND. BS + 4 Q.  EXTREMITIES: No cyanosis, clubbing or edema.    NIH Stroke Scale      1a.  0 Level of consciousness:  0 = Alert; keenly responsive.   1 = Not alert; but arousable by minor stimulation   2 = Not alert; requires repeated stimulation   3 = COMA    1b.  0 LOC questions:   Ask patient month/Their age.  0 = Answers both questions correctly.   1 = Answers one question correctly.   2 = Answers neither question correctly.    1c.  0 LOC commands:   Ask patient to open/close eyes.  0 = OBEYS both tasks correctly.   1 = OBEYS one task correctly.   2 = OBEYS neither task correctly.    2.  0 Best Gaze:   Only horizontal eye movements will be tested.  0 = Normal.   1 = Partial gaze palsy   2 = Forced deviation    3.  0 Visual:  0 = No visual loss.   1 = Partial hemianopia.   2 = Complete hemianopia.   3 = Bilateral hemianopia (blind including cortical blindness).    4.  0 Facial Palsy:   Ask patient to show teeth or raise eyebrows and close eyes.  0 = Normal symmetrical movements.   1 = Minor paralysis (flattened nasolabial fold, asymmetry on smiling).   2 = Partial paralysis (total or near-total paralysis of lower face).   3 = Complete paralysis of one or both sides (absence of facial movement in the upper and lower face).    5a.  0 Motor left arm: However patient is unable to extend wrist, as would be seen in a radial palsy. Not consistent with stroke. 0 = No drift; limb holds 90 (or 45) degrees for full 10 seconds.   1 = Drift   2 = Some effort against gravity   3 = No effort against gravity   4 = No movement.   9 = UNTESTABLE(Joint  fused/limb amputated)    5b.  0 Motor right arm:  0 = No drift; limb holds 90 (or 45) degrees for full 10 seconds.   1 = Drift   2 = Some  effort against gravity   3 = No effort against gravity   4 = No movement.   9 = UNTESTABLE(Joint fused/limb amputated)    6a.  0 Motor left leg:  0 = No drift; leg holds 30-degree position for full 5 seconds.   1 = Drift   2 = Some effort against gravity   3 = No effort against gravity   4 = No movement.   9 = UNTESTABLE(Joint fused/lim amputated)    6b.  0 Motor right leg:  0 = No drift; leg holds 30-degree position for full 5 seconds.   1 = Drift   2 = Some effort against gravity   3 = No effort against gravity   4 = No movement.   9 = UNTESTABLE(Joint fused/lim amputated)    7.  0 Limb Ataxia:  0 = No Ataxia   1 = Present in 1 limb.   2 = Present in 2 limbs.    8.  0 Sensory:   Use pinprick to test arms/legs/trunk/face,compare side to side.  0 = Normal; no sensory loss.   1 = Mild-to-moderate sensory loss   2 = Severe to total sensory loss    9.  0 Best Language:   Describe picture/name items/read sentences.  0 = No aphasia; normal.   1 = Mild-to-moderate aphasia   2 = Severe aphasia   3 = Mute    10.  1 Dysarthria:   Patient states speech is not her normal. 0 = Normal Articulation   1 = Mild-to-moderate slurring   2 = near unintelligible or unalbe to speak   9 = Intubated or other physical barrier    11.  0 Extinction and Inattention:  0 = Normal   1 = Inattention or extinction to bilateral simultaneous stimulation in one sensory modality   2 = Severe Hemi-inattention or Hem-attention to more than one modality    Total SCORE: 1        Course    Impression:  This is a 51 y.o. female presenting as stroke page with LUE weakness, dysarthria.    Time last seen normal is 0030.      Plan:  Stroke protocol labs  Imaging per Neurology stroke team recommendations    CT STROKE PROTOCOL    Final Result: 1.  No acute intracranial process.     2.  Symmetric perfusion maps without evidence for ischemia or infarct.    3.  Unremarkable intra-and extracranial CT angiography.                           XR AP MOBILE CHEST    Final  Result: 1.  No acute cardiopulmonary process.                      ED Course:    Labs Reviewed   BASIC METABOLIC PANEL, NON-FASTING - Abnormal; Notable for the following:     CARBON DIOXIDE 21 (*)      All other components within normal limits   CREATINE KINASE (CK), TOTAL - Abnormal; Notable for the following:     CREATINE KINASE (CK) 171 (*)      All other components within normal limits  CBC/DIFF   PT/INR   PTT (PARTIAL THROMBOPLASTIN TIME)   TROPONIN-I (FOR ED ONLY)   TYPE AND SCREEN   HCG, SERUM QUANTITATIVE, PREGNANCY       Abnormal labs during ED stay are noted above.  All other labs were within normal limits.  Imaging/ancillary testing revealed no acute process on CT stroke protocol. EKG and troponin WNL.  Patient was admitted to Neurology service for further management and observation.  For further information regarding care of patient, please refer to Neurology consultant note.      Teresa DELENA Eagles, MD 11/28/2011, 10:17 PM

## 2011-12-03 NOTE — ED Attending Note (Signed)
I have seen and evaluated the patient with the resident/MLP/scribe and have directly supervised the patient's care.  We have discussed the patient's care and I agree with the evaluation, plan of care, and disposition planning as documented in the note.  Any clarifications, exceptions, or additional information, if indicated, will be included below.     Teresa Hora, MD     51 yo female presenting to ED as transfer from [facility] for concerns of stroke page. Initially seen at chest pain complaint, but then developed LUE weakness and dysarthria, plain CT negative at outside facility. On arrival to our ED, NIHSS 1 for dysarthria, neurology evaluated, discussed risks and benefits of tpa with the patient and decided to administer.   No contraindications per labs on my review.     Labs Reviewed   BASIC METABOLIC PANEL, NON-FASTING - Abnormal; Notable for the following:     CARBON DIOXIDE 21 (*)      All other components within normal limits   CREATINE KINASE (CK), TOTAL - Abnormal; Notable for the following:     CREATINE KINASE (CK) 171 (*)      All other components within normal limits   CBC/DIFF   PT/INR   PTT (PARTIAL THROMBOPLASTIN TIME)   TROPONIN-I (FOR ED ONLY)   TYPE AND SCREEN   HCG, SERUM QUANTITATIVE, PREGNANCY     Admit to MICU/neuro for further evaluation and treatment.     Critical care time:  37 minutes  Impression: dysarthria, LUE weakness

## 2012-01-06 ENCOUNTER — Encounter (HOSPITAL_COMMUNITY): Payer: Self-pay

## 2012-01-06 ENCOUNTER — Encounter (INDEPENDENT_AMBULATORY_CARE_PROVIDER_SITE_OTHER): Payer: MEDICAID | Admitting: Neurology

## 2012-01-13 ENCOUNTER — Encounter (INDEPENDENT_AMBULATORY_CARE_PROVIDER_SITE_OTHER): Payer: MEDICAID | Admitting: Neurology

## 2012-01-14 ENCOUNTER — Ambulatory Visit
Admission: RE | Admit: 2012-01-14 | Discharge: 2012-01-14 | Disposition: A | Discharge status: Home / Self Care | Payer: MEDICAID | Source: Ambulatory Visit | Attending: Neurology | Admitting: Neurology

## 2012-01-14 DIAGNOSIS — G43909 Migraine, unspecified, not intractable, without status migrainosus: Secondary | ICD-10-CM | POA: Insufficient documentation

## 2012-03-07 NOTE — Progress Notes (Signed)
Modified Rankin Scale (mRs)    PATIENT NAME: Teresa Ramirez    DATE SCALE COMPLETED: 03/07/12    COMPLETED BY: Earma Reading, RN     mRs SCORE: 0  Spoke via phone with pt. Reports no residual symptoms whatsoever.     KEY FOR SCORING:  Rankin 0: No symptoms at all.    Rankin 1: No significant disability despite symptoms: able to carry out all usual duties.    Rankin 2:  Slight disability; unable to carry out all previous activities but able to look after own affairs without assistance.    Rankin 3: Moderate disability: requires some assistance but able to walk without assistance, independently mobile (may use walker or cane). Manages dressing, personal hygiene, and feeding, but needs help with complex tasks.     Rankin 4: Moderately severe disability; unable to walk without assistance, and unable to attend to own bodily needs without assistance.    Rankin 5: Severe disability: bedridden, incontinent, and requiring constant nursing care and attention.    Rankin 6: Deceased    Reference: http://trials-[email or web address].trainingcampus.net/UAS/Modules/TREES/windex.aspx

## 2012-03-23 ENCOUNTER — Encounter (INDEPENDENT_AMBULATORY_CARE_PROVIDER_SITE_OTHER): Payer: MEDICAID | Admitting: Neurology

## 2012-05-04 ENCOUNTER — Encounter (INDEPENDENT_AMBULATORY_CARE_PROVIDER_SITE_OTHER): Payer: MEDICAID | Admitting: GENERAL

## 2012-05-05 ENCOUNTER — Encounter (INDEPENDENT_AMBULATORY_CARE_PROVIDER_SITE_OTHER): Payer: MEDICAID | Admitting: GENERAL

## 2012-05-20 ENCOUNTER — Observation Stay (HOSPITAL_COMMUNITY): Payer: MEDICAID | Admitting: Neurology

## 2012-05-20 ENCOUNTER — Observation Stay
Admission: AD | Admit: 2012-05-20 | Discharge: 2012-05-23 | Disposition: A | Discharge status: Home / Self Care | Payer: MEDICAID | Source: Other Acute Inpatient Hospital | Attending: Neurology | Admitting: Neurology

## 2012-05-20 ENCOUNTER — Encounter (HOSPITAL_COMMUNITY): Payer: Self-pay

## 2012-05-20 DIAGNOSIS — K589 Irritable bowel syndrome without diarrhea: Secondary | ICD-10-CM | POA: Insufficient documentation

## 2012-05-20 DIAGNOSIS — F411 Generalized anxiety disorder: Secondary | ICD-10-CM | POA: Insufficient documentation

## 2012-05-20 DIAGNOSIS — R569 Unspecified convulsions: Secondary | ICD-10-CM | POA: Insufficient documentation

## 2012-05-20 DIAGNOSIS — R109 Unspecified abdominal pain: Secondary | ICD-10-CM | POA: Insufficient documentation

## 2012-05-20 DIAGNOSIS — Z23 Encounter for immunization: Secondary | ICD-10-CM | POA: Insufficient documentation

## 2012-05-20 DIAGNOSIS — E039 Hypothyroidism, unspecified: Secondary | ICD-10-CM | POA: Insufficient documentation

## 2012-05-20 DIAGNOSIS — R4182 Altered mental status, unspecified: Secondary | ICD-10-CM | POA: Insufficient documentation

## 2012-05-20 DIAGNOSIS — R748 Abnormal levels of other serum enzymes: Secondary | ICD-10-CM | POA: Insufficient documentation

## 2012-05-20 DIAGNOSIS — G43909 Migraine, unspecified, not intractable, without status migrainosus: Principal | ICD-10-CM | POA: Diagnosis present

## 2012-05-20 DIAGNOSIS — F3289 Other specified depressive episodes: Secondary | ICD-10-CM | POA: Insufficient documentation

## 2012-05-20 DIAGNOSIS — J45909 Unspecified asthma, uncomplicated: Secondary | ICD-10-CM | POA: Insufficient documentation

## 2012-05-20 DIAGNOSIS — R110 Nausea: Secondary | ICD-10-CM | POA: Insufficient documentation

## 2012-05-20 MED ORDER — ALPRAZOLAM 0.5 MG TABLET
1.00 mg | ORAL_TABLET | Freq: Three times a day (TID) | ORAL | Status: DC | PRN
Start: 2012-05-20 — End: 2012-05-22

## 2012-05-20 MED ORDER — PRAVASTATIN 10 MG TABLET
20.00 mg | ORAL_TABLET | Freq: Every evening | ORAL | Status: DC
Start: 2012-05-21 — End: 2012-05-21
  Administered 2012-05-21: 20 mg via ORAL
  Filled 2012-05-20 (×2): qty 2

## 2012-05-20 MED ORDER — MOMETASONE 110MCG (7 DOSES) BREATH ACTIVATED POWDER AEROSOL - RN
2.00 | INHALATION_SPRAY | Freq: Every evening | RESPIRATORY_TRACT | Status: DC
Start: 2012-05-21 — End: 2012-05-23
  Administered 2012-05-21 – 2012-05-22 (×3): 2 via RESPIRATORY_TRACT
  Filled 2012-05-20: qty 1

## 2012-05-20 MED ORDER — ASPIRIN 81 MG CHEWABLE TABLET
81.0000 mg | CHEWABLE_TABLET | Freq: Every day | ORAL | Status: DC
Start: 2012-05-21 — End: 2012-05-23
  Administered 2012-05-21 – 2012-05-23 (×3): 81 mg via ORAL
  Filled 2012-05-20 (×3): qty 1

## 2012-05-20 MED ORDER — SODIUM CHLORIDE 0.9 % (FLUSH) INJECTION SYRINGE
2.00 mL | INJECTION | Freq: Three times a day (TID) | INTRAMUSCULAR | Status: DC
Start: 2012-05-21 — End: 2012-05-23
  Administered 2012-05-21 – 2012-05-22 (×5): 2 mL
  Administered 2012-05-22: 0 mL
  Administered 2012-05-22: 2 mL
  Administered 2012-05-23: 0 mL

## 2012-05-20 MED ORDER — SODIUM CHLORIDE 0.9 % INTRAVENOUS SOLUTION
INTRAVENOUS | Status: DC
Start: 2012-05-21 — End: 2012-05-23

## 2012-05-20 MED ORDER — TRAZODONE 50 MG TABLET
100.00 mg | ORAL_TABLET | Freq: Every evening | ORAL | Status: DC
Start: 2012-05-21 — End: 2012-05-23
  Administered 2012-05-21 – 2012-05-22 (×3): 100 mg via ORAL
  Filled 2012-05-20 (×4): qty 2

## 2012-05-20 MED ORDER — TOPIRAMATE 25 MG TABLET
150.00 mg | ORAL_TABLET | Freq: Two times a day (BID) | ORAL | Status: DC
Start: 2012-05-21 — End: 2012-05-21
  Administered 2012-05-21 (×2): 150 mg via ORAL
  Filled 2012-05-20 (×3): qty 2

## 2012-05-20 MED ORDER — MAGNESIUM SULFATE 1 GRAM/100 ML IN DEXTROSE 5 % INTRAVENOUS PIGGYBACK
1.0000 g | INJECTION | Freq: Three times a day (TID) | INTRAVENOUS | Status: DC
Start: 2012-05-21 — End: 2012-05-23
  Administered 2012-05-21 – 2012-05-23 (×8): 1 g via INTRAVENOUS
  Filled 2012-05-20 (×10): qty 100

## 2012-05-20 MED ORDER — SODIUM CHLORIDE 0.9 % (FLUSH) INJECTION SYRINGE
2.00 mL | INJECTION | INTRAMUSCULAR | Status: DC | PRN
Start: 2012-05-20 — End: 2012-05-23
  Administered 2012-05-22: 2 mL
  Filled 2012-05-20: qty 10

## 2012-05-20 MED ORDER — PRAVASTATIN 40 MG TABLET
40.00 mg | ORAL_TABLET | Freq: Every evening | ORAL | Status: DC
Start: 2012-05-21 — End: 2012-05-20
  Filled 2012-05-20: qty 1

## 2012-05-20 MED ORDER — LEVOTHYROXINE 25 MCG TABLET
25.0000 ug | ORAL_TABLET | Freq: Every day | ORAL | Status: DC
Start: 2012-05-21 — End: 2012-05-23
  Administered 2012-05-21 – 2012-05-23 (×3): 25 ug via ORAL
  Filled 2012-05-20 (×3): qty 1

## 2012-05-20 MED ORDER — ENOXAPARIN 40 MG/0.4 ML SUBCUTANEOUS SYRINGE
40.00 mg | INJECTION | SUBCUTANEOUS | Status: DC
Start: 2012-05-21 — End: 2012-05-23
  Administered 2012-05-21: 40 mg via SUBCUTANEOUS
  Administered 2012-05-22: 0 mg via SUBCUTANEOUS
  Administered 2012-05-23: 40 mg via SUBCUTANEOUS
  Filled 2012-05-20 (×4): qty 0.4

## 2012-05-20 MED ORDER — PAROXETINE 20 MG TABLET
20.00 mg | ORAL_TABLET | Freq: Every morning | ORAL | Status: DC
Start: 2012-05-21 — End: 2012-05-23
  Administered 2012-05-21 – 2012-05-23 (×3): 20 mg via ORAL
  Filled 2012-05-20 (×4): qty 1

## 2012-05-20 MED ORDER — ALBUTEROL SULFATE HFA 90 MCG/ACTUATION AEROSOL INHALER
2.0000 | INHALATION_SPRAY | Freq: Four times a day (QID) | RESPIRATORY_TRACT | Status: DC | PRN
Start: 2012-05-20 — End: 2012-05-23

## 2012-05-20 MED ORDER — METHYLPREDNISOLONE SOD SUCC 125 MG SOLUTION FOR INJECTION WRAPPER
250.0000 mg | Freq: Four times a day (QID) | INTRAVENOUS | Status: DC
Start: 2012-05-21 — End: 2012-05-23
  Administered 2012-05-21 (×3): 250 mg via INTRAVENOUS
  Administered 2012-05-21 – 2012-05-22 (×5): 0 mg via INTRAVENOUS
  Administered 2012-05-23 (×2): 250 mg via INTRAVENOUS
  Filled 2012-05-20 (×13): qty 4

## 2012-05-20 MED ORDER — KETOROLAC 30 MG/ML (1 ML) INJECTION SOLUTION
30.0000 mg | Freq: Three times a day (TID) | INTRAMUSCULAR | Status: DC
Start: 2012-05-21 — End: 2012-05-23
  Administered 2012-05-21 – 2012-05-23 (×8): 30 mg via INTRAVENOUS
  Filled 2012-05-20 (×10): qty 1

## 2012-05-21 LAB — CBC/DIFF
BASOPHILS: 0 %
BASOS ABS: 0 10*3/uL (ref 0.0–0.2)
EOS ABS: 0 THOU/uL (ref 0.0–0.5)
EOSINOPHIL: 0 %
HCT: 33.6 % (ref 33.5–45.2)
HGB: 11.1 g/dL — ABNORMAL LOW (ref 11.2–15.2)
LYMPHOCYTES: 21 %
LYMPHS ABS: 1.6 THOU/uL (ref 1.5–3.5)
MCH: 30.9 pg (ref 27.4–33.0)
MCHC: 33.1 g/dL (ref 32.5–35.8)
MCV: 93.3 fL (ref 78–100)
MONOCYTES: 6 %
MONOS ABS: 0.5 10*3/uL (ref 0.3–1.0)
MPV: 10.2 fL (ref 7.5–11.5)
PLATELET COUNT: 153 THOU/uL (ref 140–450)
PMN ABS: 5.6 THOU/uL (ref 2.0–6.5)
PMN'S: 73 %
RBC: 3.6 MIL/uL — ABNORMAL LOW (ref 3.63–4.92)
RDW: 14.2 % (ref 12.0–15.0)
WBC: 7.7 THOU/uL (ref 3.5–11.0)

## 2012-05-21 LAB — BILIRUBIN, TOTAL/CONJ
BILIRUBIN, TOTAL: 1.1 mg/dL (ref 0.3–1.3)
BILIRUBIN,CONJUGATED: <0.2 mg/dL (ref 0.0–0.3)

## 2012-05-21 LAB — ALBUMIN: ALBUMIN: 2.8 g/dL — ABNORMAL LOW (ref 3.5–4.8)

## 2012-05-21 LAB — BASIC METABOLIC PANEL
ANION GAP: 9 mmol/L (ref 5–16)
BUN/CREAT RATIO: 8 (ref 6–22)
BUN: 8 mg/dL (ref 6–20)
CALCIUM: 8.5 mg/dL (ref 8.5–10.4)
CARBON DIOXIDE: 21 mmol/L — ABNORMAL LOW (ref 22–32)
CHLORIDE: 108 mmol/L (ref 96–111)
CREATININE: 0.96 mg/dL (ref 0.49–1.10)
ESTIMATED GLOMERULAR FILTRATION RATE: >59 mL/min/{1.73_m2} (ref >59)
GLUCOSE,NONFAST: 97 mg/dL (ref 65–139)
POTASSIUM: 3.3 mmol/L — ABNORMAL LOW (ref 3.5–5.1)
SODIUM: 138 mmol/L (ref 136–145)

## 2012-05-21 LAB — ALK PHOS (ALKALINE PHOSPHATASE): ALKALINE PHOSPHATASE: 55 U/L (ref 38–126)

## 2012-05-21 LAB — TOTAL PROTEIN: TOTAL PROTEIN: 5.9 g/dL — ABNORMAL LOW (ref 6.4–8.3)

## 2012-05-21 LAB — ALT (SGPT): ALT (SGPT): 227 U/L — ABNORMAL HIGH (ref 7–45)

## 2012-05-21 LAB — THYROID STIMULATING HORMONE WITH FREE T4 REFLEX: THYROID STIMULATING HORMONE WITH FREE T4 REFLEX: 0.48 u[IU]/mL (ref 0.300–5.900)

## 2012-05-21 LAB — AST (SGOT): AST (SGOT): 320 U/L — ABNORMAL HIGH (ref 8–41)

## 2012-05-21 MED ORDER — FLU VACCINE QS2013-14(36 MOS+)(PF) 60 MCG(15 MCG X4)/0.5 ML IM SYRINGE
0.5000 mL | INJECTION | Freq: Once | INTRAMUSCULAR | Status: AC
Start: 2012-05-21 — End: 2012-05-22
  Administered 2012-05-22: 0.5 mL via INTRAMUSCULAR
  Filled 2012-05-21: qty 0.5

## 2012-05-21 MED ORDER — SODIUM CHLORIDE 0.9 % IV BOLUS
250.0000 mL | INJECTION | Freq: Once | Status: AC
Start: 2012-05-21 — End: 2012-05-21
  Administered 2012-05-21: 250 mL via INTRAVENOUS

## 2012-05-21 MED ORDER — POTASSIUM CHLORIDE ER 20 MEQ TABLET,EXTENDED RELEASE(PART/CRYST)
20.0000 meq | ORAL_TABLET | ORAL | Status: AC
Start: 2012-05-21 — End: 2012-05-21
  Administered 2012-05-21: 20 meq via ORAL
  Filled 2012-05-21: qty 1

## 2012-05-21 NOTE — Nurses Notes (Signed)
Notified MD Deanne Coffer pt potassium is 3.3 Pt is very lethargic and falling back asleep before she is able to answer orientation questions. Pt BP @ 0400 was 92/58 HR 71 and @ 0500 92/51 HR 68. Per MD cont to monitor. No further orders obtained at this time.

## 2012-05-21 NOTE — Consults (Addendum)
[facility]  MEDICINE CONSULT    Teresa Ramirez, Teresa Ramirez, 51 y.o. female  Date of Birth:  03-19-1961  Date of Admission:  05/20/2012  Date of service: 05/21/2012    Service: Neurology  Requesting MD: Dr. Chales Salmon    Reason for consultation: Elevated AST/ALT    Assessment/Recommendation(s):  Pt is a 51 y/o F with history of asthma, seizures, migraines and IBS who presented after a questionable seizure and has altered mental status.  Medicine was consulted for elevated AST/ALT.    Elevated AST/ALT   - etiology unclear but likely medication-induced   - recommend holding pravachol as patient also has elevated CK level   - monitor daily AST/ALT, T protein   - recommend checking RUQ ultrasound as patient is tender to palpation      HPI  Pt is a 51 y/o F with history of asthma, seizures, migraines and IBS who presented after a questionable seizure and has altered mental status.  Patient is a very poor historian with slow speech and unable to give much history.  Most of history gathered through chart review.  Patient was seen at [facility] for migraines lasting 5 days and while she was there had a seizures per patient report and was transferred here.  She takes topamax for her seizures.  Medicine was consulted for AST and ALT elevated in 200's.  Patient reports no prior history of liver disease. No CP, SOB, fevers/chlls.  States she was nauseous and vomited overnight. Complains of left sided abdominal pain.  She tends to repeat answers and does not answer some questions appropriately.  She states she was given a prescription for "methadone" at [facility].      Past Medical History   Diagnosis Date   . Migraines    . IBS (irritable bowel syndrome)    . Asthma    . Angiomyolipoma of kidney    . Environmental allergies    . Epilepsy    . Hypothyroid    . Insomnia    . HTN    . CVA (cerebrovascular accident)      Past Surgical History   Procedure Laterality Date   . Hx hand surgery       Allergies    Allergen Reactions   . Compazine (Prochlorperazine Edisylate) Seizure   . Haldol (Haloperidol)    . Periactin (Cyproheptadine) Anaphylaxis   . Phenergan (Promethazine) Seizure   . Reglan (Metoclopramide) Seizure     Family History  Family History   Problem Relation Age of Onset   . Cancer Neg Hx    . Diabetes Neg Hx    . Heart Attack Maternal Grandfather 70   . Heart Attack Maternal Grandmother 70   . Hypertension Maternal Grandmother    . Hypertension Mother    . Seizures Neg Hx    . Elevated Lipids Mother        Social History  History     Social History   . Marital Status: Divorced     Spouse Name: N/A     Number of Children: N/A   . Years of Education: N/A     Occupational History   . Not on file.     Social History Main Topics   . Smoking status: Current Some Day Smoker -- 0.10 packs/day for 3 years     Types: Cigarettes   . Smokeless tobacco: Never Used    Comment: "maybe about a pack a month, on and off for 3 years"   .  Alcohol Use: No   . Drug Use: No      past use of marijuana in college   . Sexually Active: Yes -- Female partner(s)     Birth Control/ Protection: Pill      has a boyfriend and is monogamous with him.      Other Topics Concern   . Not on file     Social History Narrative   . No narrative on file       Medications Prior to Admission    Medication    aspirin 81 mg Oral Tablet, Chewable    Take 1 Tab (81 mg total) by mouth Once a day    pravastatin (PRAVACHOL) 40 mg Oral Tablet    Take 1 Tab (40 mg total) by mouth QPM    levothyroxine (SYNTHROID) 25 mcg Oral Tablet    Take 25 mcg by mouth Once a day    ALPRAZolam (XANAX) 1 mg Oral Tablet    Take 1 mg by mouth Three times a day as needed    paroxetine (PAXIL) 20 mg Oral Tablet    take 1 Tab by mouth QAM.    trazodone (DESYREL) 50 mg Oral Tablet    take 2 Tabs by mouth Every night as needed.    propranolol (INDERAL LA) 60 mg Oral Capsule,Sustained Action 24 hr    take 1 Cap by mouth Once a day.     ferrous sulfate (IRON) 325 mg (65 mg iron) Oral Tablet    take 325 mg by mouth Once a day.      fluticasone (FLOVENT HFA) 110 mcg/Actuation Aero oral inhaler    take 1 Puff by inhalation Twice daily.    topiramate (TOPAMAX) 100 mg Tab    take by mouth. 3 bid     Butorphanol Tartrate (STADOL) 10 mg/mL Spry    by Nasal route. As needed    Sumatriptan Succinate (IMITREX) 100 mg Tab    take by mouth. As needed    Zolpidem (AMBIEN) 10 mg Tab    take by mouth. 1 qhs    NECON 1/50 (28) ORAL    take by mouth. 1 daily    VENTOLIN INHL    take by inhalation. As needed    ondansetron (ZOFRAN) 4 mg Tab    take by mouth. As needed    ZYRTEC-D PO    Take by mouth BID          ROS:  General: No fever, malaise  Skin:  No rashes, bruises.  Head/Neck:  + migraines, No LOC, dizziness, stiffness.  Eyes:  No redness, change of vision.  Ears:  No ringing, no pain.  Nose:  No congestion, epistaxis, sinus pressure.  Throat/Mouth:  No soreness, dysphagia.  Chest/Lungs:  No SOB, pleuritic pain, DOE, cough, sputum, hemoptysis.  CV:  No Chest pain, chest pressure, palpatations,pedal edema.  GI: + nausea, vomiting and abdominal pain.  No diarrhea, constipation, hematemisis, melena, hematochezia.  GU:  No dysuria, hematuria, flank pain, discharge.  Musculoskeletal:  No swelling, No weakness.  Mental Status:  No depression, SI, thoughts of violence.  Neurologic:  No dizziness, seizures, numbness, tingling, weakness, imbalance.      EXAM:  Temperature: 37.2 C (99 F)  Heart Rate: 77   BP (Non-Invasive): 120/68 mmHg  Respiratory Rate: 18   SpO2-1: 93 %  General: acutely ill and no distress  Eyes: Conjunctiva clear., Pupils equal and round.   HENT:Head atraumatic and normocephalic, ENT without erythema or  injection, mucous membranes moist.  Neck: No JVD or thyromegaly or lymphadenopathy  Lungs: Clear to auscultation bilaterally.   Cardiovascular: regular rate and rhythm, S1, S2 normal, no murmur, click, rub or gallop   Abdomen: soft, non-distended.  + tender to palpation in RUQ and LLQ.  Mild guarding but no rigidity.  Normal BS x 4.   Genito-urinary: Deferred  Extremities: No cyanosis or edema  Skin: Skin warm and dry and No rashes  Neurologic: Alert but disoriented to place and time.  Follows all commands.  Does not answer questions appropriately at times as she has repetition of answers.  Lymphatics: No lymphadenopathy  Psychiatric: abnormal speech with repetition of answers.      Studies:  I have reviewed all available studies within the electronic medical record.    Lauralee Evener, MD 05/21/2012, 12:08 PM      Late entry for 05/21/12. I saw and examined the patient.  I reviewed the resident's note.  I agree with the findings and plan of care as documented in the resident's note.  Any exceptions/additions are edited/noted.  Fully awake and attentive but "rewinds" her answers repeatedly - perseveration of sentence or portion of sentence of answer given one or more times before.  Carolann Littler, MD 05/22/2012, 8:50 AM

## 2012-05-21 NOTE — Nurses Notes (Signed)
Pt remains lethargic but responsive to voice, bp 82/50 and confirmed with manual cuff.  Dr Ralene Ok paged, currently awaiting orders.
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
## 2012-05-21 NOTE — Nurses Notes (Signed)
 NS bolus ordered for decreased blood pressure.  After completion of bolus manual bp recheck 100/60.  Pt states now feeling much better and is alert and oriented to self, place, and situation.  Pt and mother updated regarding plan of care via phone call at patient's request.

## 2012-05-21 NOTE — Care Plan (Signed)
 Problem: General Plan of Care(Adult,OB)  Goal: Plan of Care Review(Adult,OB)  The patient and/or their representative will communicate an understanding of their plan of care   Outcome: Ongoing (see interventions/notes)  Patient is a 51 year old Neurology patient admitted 10/12. She presents from an [facility] where she presents with a migraine HA for 5 days. Patient has received Botox injections related to headaches. Her PMH includes: Migraines, IBS, Asthma, Epilepsy, Hpothyroid, Insomnia, HTN, CVA. Regarding her HA-  She receives IV Magnesium , Toradol  and Solu-Medrol . She has also been found to have elevated liver enzymes and was consulted by Medicine.  Plan- awaiting EEG as patient had a reported seizure at [facility]. She is currently disoriented to place, she follows simple commands, SpO2 93% on RA. Patient's pain is managed with scheduled Toradol  and Solu-Medrol , she ambulates with assist and remains a fall/seizure risk, bed alarm and padded side rails.

## 2012-05-21 NOTE — Care Plan (Signed)
Problem: General Plan of Care(Adult,OB)  Goal: Plan of Care Review(Adult,OB)  The patient and/or their representative will communicate an understanding of their plan of care   Outcome: Ongoing (see interventions/notes)  Pt and mother educated on current POC. Seizure and Fall Precautions maintained. Sitter select placed under pt. Pt has headache 9/10 will medicate when MD places orders.

## 2012-05-21 NOTE — Care Management Notes (Signed)
Care Coordinator/Social Work Plan  [facility]  Patient Name: Teresa Ramirez [facility]   MRN: [id number]   Acct Number: [id number]  DOB: 12-07-1960 Age: 51  **Admission Information**  Patient Type: OBSERVATION  Admit Date: 05/20/2012 Admit Time: 22:34  Admit Reason: headache  Admitting Phys: Verner Mould  Attending Phys: Verner Mould  Unit: 9W Bed: 929-A  160. LOC Notification, CMS Important Message / Detailed Notice  Created by : Rolanda Lundborg Date/Time 2012-05-21 16:02:44.000  Medical Necessity and Level Of Care Notification  Level of Care Notification Patient notified of Level of Care status change to Observation (Date / Time) 05/21/2012 @  3:20 PM

## 2012-05-21 NOTE — Care Management Notes (Signed)
Utilization Review Determination    SECTION I  Reason for Physician Advisor Referral: Does not meet screening criteria for admission. 05/21/2012 (date)    Current MERLIN MD Order: Inpatient  Current [facility] ADT Order: Inpatient  Utilization Review Findings: Observation  Additional information available for medical team: No    Utilization Review MD: Dr Cassie Freer  Based on clinical information available to me as per above and in chart, the patient's status should be: Observation    Utilization Review RN: Elmon Kirschner RN                               Date/Time: 05/21/2012  1320  -------------------------------------------------------------------------------------------------------------------  1.  I have reviewed this case with the patient's treating physician Dr Franne Forts, and the MD agrees with this change in status.  They are aware of the referral to the Utilization Review Committee.        4.  MERLIN order changed: Yes       OBS Database: Yes       UDR Doc in Allscripts: Yes    5.  The patient has been notified on 05/21/2012 (time) on 1320 (day) of any changes in level of care.        Patient Notification Doc in Allscripts:Yes    Utilization Review RN: Elmon Kirschner RM                                  Date/Time: 05/21/2012  1320  (Patient notification is required only if the status is changed while an Inpatient to Observation Status)  -------------------------------------------------------------------------------------------------------------------

## 2012-05-21 NOTE — Nurses Notes (Signed)
Pt arrived to room 960 from 9W at this time. Pt assessment per previous nurse. No signs of distress noted. Magnesium IV running at this time thru 20g IV in Rt A/C. Sitter select and video monitoring set up.

## 2012-05-22 ENCOUNTER — Observation Stay (HOSPITAL_BASED_OUTPATIENT_CLINIC_OR_DEPARTMENT_OTHER): Payer: MEDICAID

## 2012-05-22 LAB — BASIC METABOLIC PANEL
ANION GAP: 4 mmol/L — ABNORMAL LOW (ref 5–16)
BUN/CREAT RATIO: 12 (ref 6–22)
BUN: 8 mg/dL (ref 6–20)
CALCIUM: 8.4 mg/dL — ABNORMAL LOW (ref 8.5–10.4)
CARBON DIOXIDE: 21 mmol/L — ABNORMAL LOW (ref 22–32)
CHLORIDE: 112 mmol/L — ABNORMAL HIGH (ref 96–111)
CREATININE: 0.67 mg/dL (ref 0.49–1.10)
ESTIMATED GLOMERULAR FILTRATION RATE: >59 ml/min/1.73m2 (ref >59)
GLUCOSE,NONFAST: 166 mg/dL — ABNORMAL HIGH (ref 65–139)
POTASSIUM: 4 mmol/L (ref 3.5–5.1)
SODIUM: 137 mmol/L (ref 136–145)

## 2012-05-22 LAB — CBC/DIFF
BASOPHILS: 0 %
BASOS ABS: 0 THOU/uL (ref 0.0–0.2)
EOS ABS: 0 THOU/uL (ref 0.0–0.5)
EOSINOPHIL: 0 %
HCT: 29.1 % — ABNORMAL LOW (ref 33.5–45.2)
HGB: 9.9 g/dL — ABNORMAL LOW (ref 11.2–15.2)
LYMPHOCYTES: 13 %
LYMPHS ABS: 1 THOU/uL — ABNORMAL LOW (ref 1.5–3.5)
MCH: 31.4 pg (ref 27.4–33.0)
MCHC: 34.1 g/dL (ref 32.5–35.8)
MCV: 91.9 fL (ref 78–100)
MONOCYTES: 6 %
MONOS ABS: 0.5 10*3/uL (ref 0.3–1.0)
MPV: 10.2 fL (ref 7.5–11.5)
PLATELET COUNT: 139 THOU/uL — ABNORMAL LOW (ref 140–450)
PMN ABS: 5.9 10*3/uL (ref 2.0–6.5)
PMN'S: 81 %
RBC: 3.16 MIL/uL — ABNORMAL LOW (ref 3.63–4.92)
RDW: 14.1 % (ref 12.0–15.0)
WBC: 7.3 10*3/uL (ref 3.5–11.0)

## 2012-05-22 LAB — AST (SGOT): AST (SGOT): 122 U/L — ABNORMAL HIGH (ref 8–41)

## 2012-05-22 LAB — ALT (SGPT): ALT (SGPT): 159 U/L — ABNORMAL HIGH (ref 7–45)

## 2012-05-22 LAB — HCG, URINE QUALITATIVE, PREGNANCY

## 2012-05-22 MED ORDER — ALPRAZOLAM 0.5 MG TABLET
1.00 mg | ORAL_TABLET | Freq: Three times a day (TID) | ORAL | Status: DC
Start: 2012-05-22 — End: 2012-05-23
  Administered 2012-05-22 – 2012-05-23 (×3): 1 mg via ORAL
  Filled 2012-05-22 (×3): qty 2

## 2012-05-22 MED ORDER — GABAPENTIN 300 MG CAPSULE
300.0000 mg | ORAL_CAPSULE | Freq: Three times a day (TID) | ORAL | Status: DC
Start: 2012-05-22 — End: 2012-05-23
  Administered 2012-05-22 – 2012-05-23 (×3): 300 mg via ORAL
  Filled 2012-05-22 (×5): qty 1

## 2012-05-22 NOTE — Consults (Addendum)
Received consult request for this patient.  After further information obtained, patient reports seeing bugs and reports taking Xanax since '99.  She has not received any since admission.  She was very receptive to seeing Psychiatry and I do believe she will be willing to follow-up as an outpatient.  Further evaluation is required and we will follow-up later this evening or tomorrow.  However, would recommend giving her home dose of Xanax at least until further assessment performed.  Due to using for that long, (unsure of her dose), she could be at risk for withdrawal.    Tacey Heap, MD 05/22/2012, 3:22 PM

## 2012-05-22 NOTE — Procedures (Addendum)
 [place]  [facility]                                ELECTROENCEPHALOGRAM REPORT                                EEG\EMG Scheduling [phone]                                 STATUS: J      NAMEOCEANIA, [name]   TCLY#:[id number]  DATE: 05/22/2012  DOB :  [birth year]  SEX:F                  EEG #:  [id number].  Technician:  AP.    INTERPRETATION:  This was an abnormal EEG due to mild encephalopathy.    HISTORY:  This is a 51 year old female who presents with headache and confusion and has a clinical concern for seizure.  The study was requested to evaluate for interictal abnormalities.    REPORT:  This was a digitally acquired adult EEG following the standard 10-20 system of electrode placement.  This EEG ran for a total duration of 42 minutes.  No clear sleep architecture was recorded.  When the patient was awake, there was a background activity consisting of a frequency of approximately 7 Hz over the posterior head regions suggestive of diffuse slowing and moderate encephalopathy.  Photic stimulation was performed and did not elicit any additional abnormalities.  Hyperventilation was omitted due to history of stroke.  No clear epileptiform discharge or electrographic seizures were recorded.    CLINICAL CORRELATION:  The EEG described above is suggestive of diffuse slowing and moderate encephalopathy.  Toxic, metabolic, hypoxic or infectious causes should be considered.      Teresa Queen, MD  Resident  [facility] Department of Neurology  I have reviewed the EEG and agree with the findings of this report. Any additions, exceptions or clarifications are noted above.    Teresa Kling, MD  Professor and [facility] of Neurology    [id number]; D: 05/22/2012 15:35:20; T: 05/22/2012 16:03:24

## 2012-05-22 NOTE — Care Plan (Signed)
Problem: General Plan of Care(Adult,OB)  Goal: Plan of Care Review(Adult,OB)  The patient and/or their representative will communicate an understanding of their plan of care   Outcome: Ongoing (see interventions/notes)  Patient is ambulating in room to the bathroom. Tolerating her regular diet well. Pain has been tolerable, patient is refusing any interventions. Neuro status is unchanged.

## 2012-05-22 NOTE — Ancillary Notes (Signed)
 [place]  [facility] Note    Teresa Ramirez  DATE OF SERVICE:  05/22/2012          EEG has been completed.      Juliene Bernardino Roll, END [facility] 05/22/2012, 12:39 PM

## 2012-05-22 NOTE — Consults (Addendum)
[facility]  Medicine Consult  Follow Up Note    Teresa Ramirez, Teresa Ramirez, 51 y.o. female  Date of Service: 05/22/2012  Date of Birth:  Mar 19, 1961    Hospital Day:  LOS: 2 days     Chief Complaint:  Elevated AST and ALT  Subjective: Pt denies any acute issues overnight.  States improvement in her headaches and is more oriented this am.  Denies any fevers/chills, nausea/vomiting, CP or SOB this am.  No seizures reported either.      Objective:  Temperature: 36.8 C (98.2 F)  Heart Rate: 81   BP (Non-Invasive): 139/90 mmHg  Respiratory Rate: 18   SpO2-1: 98 %  Pain Score (Numeric, Faces): 2  General: acutely ill and no distress  HENT:Head atraumatic and normocephalic, ENT without erythema or injection, mucous membranes moist.  Lungs: Clear to auscultation bilaterally.   Cardiovascular: regular rate and rhythm, S1, S2 normal, no murmur, click, rub or gallop  Abdomen: Soft, non-tender, Bowel sounds normal  Extremities: No cyanosis or edema  Skin: Skin warm and dry and No rashes  Neurologic: CN II - XII grossly intact , Alert and oriented x3, no neurological deficits.  Speech at times slow and has occasional disorientation/confusion.     Labs:    Lab Results for Last 24 Hours:    Results for orders placed during the hospital encounter of 05/20/12 (from the past 24 hour(s))   CBC/DIFF       Result Value Range    WBC 7.3  3.5 - 11.0 THOU/uL    RBC 3.16 (*) 3.63 - 4.92 MIL/uL    HGB 9.9 (*) 11.2 - 15.2 g/dL    HCT 30.8 (*) 65.7 - 45.2 %    MCV 91.9  78 - 100 fL    MCH 31.4  27.4 - 33.0 pg    MCHC 34.1  32.5 - 35.8 g/dL    RDW 84.6  96.2 - 95.2 %    PLATELET COUNT 139 (*) 140 - 450 THOU/uL    MPV 10.2  7.5 - 11.5 fL    PMN'S 81      PMN ABS 5.900  2.0 - 6.5 THOU/uL    LYMPHOCYTES 13      LYMPHS ABS 1.000 (*) 1.5 - 3.5 THOU/uL    MONOCYTES 6      MONOS ABS 0.500  0.3 - 1.0 THOU/uL    EOSINOPHIL 0      EOS ABS 0.000  0.0 - 0.5 THOU/uL    BASOPHILS 0      BASOS ABS 0.000  0.0 - 0.2 THOU/uL    BASIC METABOLIC PANEL, NON-FASTING       Result Value Range    SODIUM 137  136 - 145 mmol/L    POTASSIUM 4.0  3.5 - 5.1 mmol/L    CHLORIDE 112 (*) 96 - 111 mmol/L    CARBON DIOXIDE 21 (*) 22 - 32 mmol/L    ANION GAP 4 (*) 5 - 16 mmol/L    CREATININE 0.67  0.49 - 1.10 mg/dL    ESTIMATED GLOMERULAR FILTRATION RATE >59  >59 ml/min/1.25m2    GLUCOSE,NONFAST 166 (*) 65 - 139 mg/dL    BUN 8  6 - 20 mg/dL    BUN/CREAT RATIO 12  6 - 22    CALCIUM 8.4 (*) 8.5 - 10.4 mg/dL   AST (SGOT)       Result Value Range    AST (SGOT) 122 (*) 8 - 41 U/L  ALT (SGPT)       Result Value Range    ALT (SGPT) 159 (*) 7 - 45 U/L   HCG, URINE QUALITATIVE, PREGNANCY       Result Value Range    PREGNANCY, URINE   (*) NEGATIVE    Value: The pregnancy test shows an indeterminate result, suggesting an hCG      level of less than 25 mIU hCG/mL.  Repeated analysis in 48 hours is      recommended.  If this is not medically feasible, a quantitative serum      hCG test should be performed.       Imaging Studies:    RUQ ultrasound (10/14) - no acute hepatobiliary disease.  Incidental angiomyolipoma of right kidney.     Assessment/Recommendations:  Pt is a 51 y/o F with history of seizures, asthma, migraines, and IBS who presented with AMS and medicine was consulted for elevated AST/ALT levels.     Isolated AST/ALT level elevation - likely secondary to medications   - improved after holding pravachol, improved abdominal pain   - RUQ ultrasound was unremarkable   - recommend monitoring trend.    - also recommend trending CK level as it was high initially   - would recommend holding pravachol upon discharge and follow up with her PCP    Patient also has been receiving IV magnesium for headaches, would recommend checking Mg level.      Thank you for this consult.  Will signoff at this time.  Please call with any questions.      Lauralee Evener, MD 05/22/2012, 1:47 PM       Late entry for 05/22/12. I saw and examined the patient.  I reviewed the resident's note.  I agree with the findings and plan of care as documented in the resident's note.  Any exceptions/additions are edited/noted.    Carolann Littler, MD 05/23/2012, 11:26 AM
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
## 2012-05-23 LAB — CBC/DIFF
BASOPHILS: 0 %
BASOS ABS: 0 10*3/uL (ref 0.0–0.2)
EOS ABS: 0 THOU/uL (ref 0.0–0.5)
EOSINOPHIL: 0 %
HCT: 28.6 % — ABNORMAL LOW (ref 33.5–45.2)
HGB: 9.7 g/dL — ABNORMAL LOW (ref 11.2–15.2)
LYMPHOCYTES: 34 %
LYMPHS ABS: 2.7 10*3/uL (ref 1.5–3.5)
MCH: 31 pg (ref 27.4–33.0)
MCHC: 33.8 g/dL (ref 32.5–35.8)
MCV: 91.7 fL (ref 78–100)
MONOCYTES: 5 %
MONOS ABS: 0.4 THOU/uL (ref 0.3–1.0)
MPV: 10.1 fL (ref 7.5–11.5)
PLATELET COUNT: 151 10*3/uL (ref 140–450)
PMN ABS: 4.8 10*3/uL (ref 2.0–6.5)
PMN'S: 61 %
RBC: 3.12 MIL/uL — ABNORMAL LOW (ref 3.63–4.92)
RDW: 14.2 % (ref 12.0–15.0)
WBC: 7.9 10*3/uL (ref 3.5–11.0)

## 2012-05-23 LAB — BASIC METABOLIC PANEL
ANION GAP: 4 mmol/L — ABNORMAL LOW (ref 5–16)
BUN/CREAT RATIO: 14 (ref 6–22)
BUN: 9 mg/dL (ref 6–20)
CALCIUM: 8.2 mg/dL — ABNORMAL LOW (ref 8.5–10.4)
CARBON DIOXIDE: 23 mmol/L (ref 22–32)
CHLORIDE: 115 mmol/L — ABNORMAL HIGH (ref 96–111)
CREATININE: 0.63 mg/dL (ref 0.49–1.10)
ESTIMATED GLOMERULAR FILTRATION RATE: >59 mL/min/{1.73_m2} (ref >59)
GLUCOSE,NONFAST: 87 mg/dL (ref 65–139)
POTASSIUM: 2.9 mmol/L — ABNORMAL LOW (ref 3.5–5.1)
SODIUM: 142 mmol/L (ref 136–145)

## 2012-05-23 LAB — AST (SGOT): AST (SGOT): 64 U/L — ABNORMAL HIGH (ref 8–41)

## 2012-05-23 LAB — POTASSIUM: POTASSIUM: 3.1 mmol/L — ABNORMAL LOW (ref 3.5–5.1)

## 2012-05-23 LAB — ALT (SGPT): ALT (SGPT): 135 U/L — ABNORMAL HIGH (ref 7–45)

## 2012-05-23 MED ORDER — POTASSIUM CHLORIDE ER 20 MEQ TABLET,EXTENDED RELEASE(PART/CRYST)
40.0000 meq | ORAL_TABLET | ORAL | Status: AC
Start: 2012-05-23 — End: 2012-05-23
  Administered 2012-05-23: 40 meq via ORAL
  Filled 2012-05-23: qty 2

## 2012-05-23 MED ORDER — GABAPENTIN 300 MG CAPSULE
300.00 mg | ORAL_CAPSULE | Freq: Three times a day (TID) | ORAL | Status: DC
Start: 2012-05-23 — End: 2017-01-27

## 2012-05-23 NOTE — Nurses Notes (Signed)
PIV pulled, d/c instructions given and understanding verbalized. D/c to home with family.

## 2012-05-23 NOTE — Care Management Notes (Signed)
The patient has been scheduled for a new patient appointment with a Psychiatrist at [facility]. The patient has been scheduled for June 01, 2012 at 2pm. The patient is agreeable to this date and time and was given an appointment card with the date and time of her appointment on it to follow-up.

## 2012-05-23 NOTE — Care Management Notes (Signed)
   [facility]  Care Management Note    Patient Name: Teresa Ramirez  Date of Birth: 1960-09-16  Sex: female  Date/Time of Admission: 05/20/2012 10:34 PM  Room/Bed: 960/A  Payor: [place] MEDICAID  Plan: [place] MEDICAID  Product Type: Medicaid     LOS: 3 days   Admitting Diagnosis:  headache    Subjective/Objective: Discharge Planning    Assessment:    05/23/12 0900   Assessment Details   Assessment Type Admission   Date of Care Management Update 05/23/12   Date of Next DCP Update 05/24/12   Care Management Plan   Discharge Planning Status initial meeting   Projected Discharge Date 05/23/12   CM will evaluate for rehabilitation potential no   Patient choice offered to patient/family no   Form for patient choice reviewed/signed and on chart no   Discharge Needs Assessment   Patient has PCP/Pediatrician?  No  (Patient states she is seeing someone out of state )   Outpatient/Agency/Support Group Needs none   Equipment Currently Used At Home none   Equipment Needed After Discharge none   Discharge Facility/Level of Care Needs Home (Patient/Family Member/other)(code 1)   Transportation Available car;family or friend will provide   Referral Information   Admission Type observation   Address Verified verified-no changes   Arrived From emergency department   Insurance Verified verified-no change   Contact Information   Name Candis, mother   Phone see demographics   Employment/Financial   Patient has Prescription Coverage?  Yes       Pharmacy Name [facility]        Pharmacy Location [place]       Name of Insurance Coverage for Medications Medicaid   Mutuality/Individual Preferences    !!What Anxieties, Fears or Concerns Do You Have About Your Health or Care? None   !!What Questions Do You Have About Your Health or Care? none   !!What Information Would Help Us  Give You More Personalized Care? none   Living Environment   Lives With parent(s)   Living Arrangements mobile home   Able to Return to Prior Living Arrangements  yes   Home Safety   Home Assessment: No Problems Identified   Home Accessibility no concerns       Discharge Plan:  Home(Patient/Family Member/other) (code 41)  51 year old female with migraine.  Patient states she lives with her mother currently and has no concerns for discharge.  No needs anticipated for discharge.  Will follow.     The patient will continue to be evaluated for developing discharge needs.     Case Manager: Tawni Velia Reding, RN 05/23/2012, 10:10 AM  Phone: [phone]

## 2012-05-23 NOTE — Nurses Notes (Signed)
Repeat K-3.1. Dr. Warren Danes aware and ordered replacements.

## 2012-05-23 NOTE — Nurses Notes (Signed)
Told Dr. Warren Danes that pt K-2.9, dropped from 4.0 yesterday. Dr. Warren Danes states she will place orders.
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
## 2012-05-23 NOTE — Care Plan (Signed)
Problem: General Plan of Care(Adult,OB)  Goal: Plan of Care Review(Adult,OB)  The patient and/or their representative will communicate an understanding of their plan of care   Outcome: Ongoing (see interventions/notes)  Pt ambulating to the bathroom. Pt more oriented. Declines any changes. Sitter select in place. Video monitoring continues. Pt calling out appropriately.

## 2012-05-23 NOTE — Care Plan (Signed)
 Problem: General Plan of Care(Adult,OB)  Goal: Plan of Care Review(Adult,OB)  The patient and/or their representative will communicate an understanding of their plan of care   Outcome: Ongoing (see interventions/notes)  Discharge Plan:  Home(Patient/Family Member/other) (code 64)  51 year old female with migraine.  Patient states she lives with her mother currently and has no concerns for discharge.  No needs anticipated for discharge.  Will follow.

## 2012-05-23 NOTE — Care Management Notes (Signed)
   [facility]  Care Management Note    Patient Name: Teresa Ramirez  Date of Birth: 14-May-1961  Sex: female  Date/Time of Admission: 05/20/2012 10:34 PM  Room/Bed: 960/A  Payor: [place] MEDICAID  Plan: [facility] MEDICAID  Product Type: Medicaid     LOS: 3 days   Admitting Diagnosis:  headache    Subjective/Objective: Outpatient Follow-Up with Psychiatry    Assessment:   MSW Teresa Ramirez following with this patient to assist with the scheduling of an outpatient follow-up appointment per Dr. Ygnacio (Psychiatry) note on 10/14. I met with this patient face to face at bedside and was informed that Teresa Ramirez would like to follow-up with a Psychiatrist at [facility]. Teresa Ramirez of Teresa Ramirez was contacted to schedule the appointment. TeresaRamirez was not available but a message was left to contact the MSW for scheduling. MSW Teresa Ramirez will continue following.     Discharge Plan:  Home(Patient/Family Member/other) (code 1)  With outpatient follow-up    The patient will continue to be evaluated for developing discharge needs.     Case Manager: Teresa Ramirez., SW 05/23/2012, 9:50 AM  Phone: [phone]
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
## 2012-06-01 ENCOUNTER — Encounter (FREE_STANDING_LABORATORY_FACILITY)
Admit: 2012-06-01 | Discharge: 2012-06-01 | Disposition: A | Discharge status: Home / Self Care | Payer: MEDICAID | Attending: Child & Adolescent Psychiatry | Admitting: Child & Adolescent Psychiatry

## 2012-06-01 ENCOUNTER — Ambulatory Visit (INDEPENDENT_AMBULATORY_CARE_PROVIDER_SITE_OTHER): Payer: MEDICAID | Admitting: Child & Adolescent Psychiatry

## 2012-06-01 VITALS — BP 124/82 | HR 76 | Ht 64.57 in | Wt 109.8 lb

## 2012-06-01 DIAGNOSIS — F3289 Other specified depressive episodes: Secondary | ICD-10-CM | POA: Diagnosis present

## 2012-06-01 LAB — HEPATIC FUNCTION PANEL
ALBUMIN: 3.1 g/dL — ABNORMAL LOW (ref 3.5–4.8)
ALKALINE PHOSPHATASE: 60 U/L (ref 38–126)
ALT (SGPT): 27 U/L (ref 7–45)
AST (SGOT): 17 U/L (ref 8–41)
BILIRUBIN, TOTAL: 0.5 mg/dL (ref 0.3–1.3)
BILIRUBIN,CONJUGATED: <0.2 mg/dl (ref 0.0–0.3)
TOTAL PROTEIN: 6.5 g/dL (ref 6.4–8.3)

## 2012-06-01 LAB — AMPHETAMINE, UR (CRH): AMPHETAMINE, URINE: NEGATIVE

## 2012-06-01 LAB — OPIATE,URINE (CRH): OPIATES, URINE: NEGATIVE

## 2012-06-01 LAB — FOLATE: FOLATE: 14.7 ng/mL (ref >4.50)

## 2012-06-01 LAB — VITAMIN B12: VITAMIN B12: 410 pg/mL (ref 180–914)

## 2012-06-01 LAB — COCAINE METABOLITE UR (CRH): COCAINE METABOLITE, URINE: NEGATIVE

## 2012-06-01 LAB — BENZODIAZEPINE,UR (CRH): BENZODIAZEPINE, URINE: POSITIVE — AB

## 2012-06-01 LAB — SPECIFIC GRAVITY/DRUG SCREEN: SPEC GRAV FOR URINE DRUG SCREEN: 1.01 (ref 1.005–1.030)

## 2012-06-01 MED ORDER — CLONAZEPAM 0.5 MG TABLET
0.50 mg | ORAL_TABLET | Freq: Three times a day (TID) | ORAL | Status: DC
Start: 2012-06-01 — End: 2017-01-27
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
## 2012-06-03 LAB — ADULTERANTS SURVEY
CREATININE: 70.2
OXIDANTS: NEGATIVE

## 2012-06-05 ENCOUNTER — Ambulatory Visit (INDEPENDENT_AMBULATORY_CARE_PROVIDER_SITE_OTHER): Payer: Self-pay | Admitting: Neurology

## 2012-06-05 LAB — BENZODIAZEPINES CONFIRMATION, URINE
7-NH-CLONAZEPAM-BY GC/MS: NEGATIVE
ALPHA OH-ALPRAZOLAM-BY GC/MS: 271
ALPHA OH-TRIAZOLAM-BY GC/MS: NEGATIVE
IMMUNOASSAY SCREEN: POSITIVE
INTERPRETATION: POSITIVE
LORAZEPAM-BY GC/MS: NEGATIVE
NORDIAZEPAM-BY GC/MS: NEGATIVE
OH-ETHYL-FLURAZEPAM-BY GC/MS: NEGATIVE
OXAZEPAM-BY GC/MS: NEGATIVE

## 2012-06-05 NOTE — Telephone Encounter (Signed)
 Talked to patient and reduced her Neurontin  dose.  She will call back if it does not help.  Trena Kennel, MD 06/05/2012, 5:08 PM

## 2012-06-05 NOTE — Telephone Encounter (Signed)
Message copied by Juliane Poot on Mon Jun 05, 2012  5:07 PM  ------       Message from: Rulon Sera       Created: Mon Jun 05, 2012  5:01 PM         >> NICOLE [facility] 06/05/2012 05:01 PM       Dr.Kitti Mcclish,       Patient having a reaction to gabapentin (NEURONTIN) 300 mg Oral Capsule. Call connected, please advise.              Rulon Sera 06/05/2012, 4:56 PM         ------

## 2012-06-06 LAB — VITAMIN D, SERUM (25 HYDROXYVITAMIN D2 AND D3 BY MS)
25 HYDROXYVITAMIN D2/D3,total: 13.7 ng/mL
25 HYDROXYVITAMIN D2: <4 ng/mL

## 2012-06-15 ENCOUNTER — Encounter (HOSPITAL_COMMUNITY): Payer: MEDICAID | Admitting: Child & Adolescent Psychiatry

## 2012-06-20 ENCOUNTER — Encounter (INDEPENDENT_AMBULATORY_CARE_PROVIDER_SITE_OTHER): Payer: Self-pay | Admitting: Neurology

## 2012-07-13 ENCOUNTER — Encounter (INDEPENDENT_AMBULATORY_CARE_PROVIDER_SITE_OTHER): Payer: MEDICAID | Admitting: GENERAL

## 2016-10-22 ENCOUNTER — Ambulatory Visit (INDEPENDENT_AMBULATORY_CARE_PROVIDER_SITE_OTHER): Payer: Medicare Other | Admitting: Obstetrics & Gynecology

## 2017-01-27 ENCOUNTER — Ambulatory Visit (INDEPENDENT_AMBULATORY_CARE_PROVIDER_SITE_OTHER): Payer: Medicare Other | Admitting: Obstetrics & Gynecology

## 2017-01-27 ENCOUNTER — Encounter (FREE_STANDING_LABORATORY_FACILITY)
Admit: 2017-01-27 | Discharge: 2017-01-27 | Disposition: A | Discharge status: Home / Self Care | Payer: Medicare Other | Attending: Obstetrics & Gynecology | Admitting: Obstetrics & Gynecology

## 2017-01-27 ENCOUNTER — Other Ambulatory Visit (FREE_STANDING_LABORATORY_FACILITY): Payer: Self-pay | Admitting: Obstetrics & Gynecology

## 2017-01-27 ENCOUNTER — Encounter (INDEPENDENT_AMBULATORY_CARE_PROVIDER_SITE_OTHER): Payer: Self-pay | Admitting: Obstetrics & Gynecology

## 2017-01-27 VITALS — BP 130/84 | Wt 142.0 lb

## 2017-01-27 DIAGNOSIS — Z1151 Encounter for screening for human papillomavirus (HPV): Secondary | ICD-10-CM | POA: Insufficient documentation

## 2017-01-27 DIAGNOSIS — Z1231 Encounter for screening mammogram for malignant neoplasm of breast: Secondary | ICD-10-CM

## 2017-01-27 DIAGNOSIS — Z01419 Encounter for gynecological examination (general) (routine) without abnormal findings: Principal | ICD-10-CM | POA: Insufficient documentation

## 2017-01-27 DIAGNOSIS — Z124 Encounter for screening for malignant neoplasm of cervix: Secondary | ICD-10-CM

## 2017-01-27 DIAGNOSIS — N852 Hypertrophy of uterus: Secondary | ICD-10-CM

## 2017-01-27 NOTE — Progress Notes (Signed)
CC:  Annual Exam    HPI: Teresa Ramirez is a 56 y.o. female here for annual exam. She has never been pregnant before. She has a history of migraines. She is currently on OCP to help manage them. She states she had more migraines when off the OCP then she does on. She states she notices it more when she gets relieved from stress that increases her migraines. She would like to go due botox injections. She recently just moved back here from [place].     Review of Systems -   Constitutional: Negative  Skin: Negative  HEENT: Negative  Cardiovascular: Negative  Respiratory: Negative  Gastrointestinal: Negative  Genitourinary: Negative  Musculoskeletal: Negative  Neurological: Negative  Psychiatric: Negative    Current Outpatient Prescriptions   Medication Sig   . aspirin 81 mg Oral Tablet, Chewable Take 1 Tab (81 mg total) by mouth Once a day   . budesonide/formoterol fumarate (SYMBICORT INHL) Take by inhalation   . ferrous sulfate (IRON) 325 mg (65 mg iron) Oral Tablet take 325 mg by mouth Once a day.     . levothyroxine (SYNTHROID) 25 mcg Oral Tablet Take 75 mcg by mouth Once a day    . Norethin-Eth Estrad Triphasic (equiv to: ORTHO NOVUM 777) 0.5/0.75/1 mg- 35 mcg Oral Tablet Take 1 Tab by mouth Once a day   . paroxetine (PAXIL) 20 mg Oral Tablet take 1 Tab by mouth QAM.   . pravastatin (PRAVACHOL) 40 mg Oral Tablet Take 1 Tab (40 mg total) by mouth QPM   . rizatriptan benzoate (MAXALT ORAL) Take by mouth   . Sumatriptan Succinate (IMITREX) 100 mg Tab take by mouth. As needed   . topiramate (TOPAMAX) 100 mg Oral Tablet Take 300 mg by mouth Twice daily   . VENTOLIN INHL take by inhalation. As needed   . zolpidem (AMBIEN) 10 mg Oral Tablet Take 10 mg by mouth Every night as needed for Insomnia      has a past medical history of Angiomyolipoma of kidney; Asthma; CVA (cerebrovascular accident) ([place]); Environmental allergies; Epilepsy ([place]); HTN; Hypothyroid; IBS (irritable bowel syndrome); Insomnia; and  Migraines. She also has no past medical history of Arthropathy, unspecified, site unspecified; Breast disorder; Cancer ([place]); Congestive heart failure, unspecified; Contact dermatitis and other eczema, due to unspecified cause; COPD (chronic obstructive pulmonary disease) ([place]); Diabetes; Dysplasia of cervix; Ectopic pregnancy; Female infertility; Heart disease; breast cancer; Liver disease; Osteoporosis; Other forms of chronic ischemic heart disease; Postpartum depression; Psychiatric problem; Rectal cancer ([place]); Reflux; STD (sexually transmitted disease); Vaginal infection; Viral hepatitis; Wears dentures; or Wears glasses.  Past Surgical History:   Procedure Laterality Date   . HX HAND SURGERY           Family Medical History     Problem Relation (Age of Onset)    Elevated Lipids Mother    Heart Attack Maternal Grandfather (58), Maternal Grandmother (85)    Hypertension Maternal Grandmother, Mother            Social History     Social History   . Marital status: Single     Spouse name: N/A   . Number of children: N/A   . Years of education: N/A     Social History Main Topics   . Smoking status: Current Some Day Smoker     Packs/day: 0.10     Years: 3.00     Types: Cigarettes   . Smokeless tobacco: Never Used  Comment: "maybe about a pack a month, on and off for 3 years"   . Alcohol use No   . Drug use: No      Comment: past use of marijuana in college   . Sexual activity: Yes     Partners: Male     Birth control/ protection: Pill      Comment: has a boyfriend and is monogamous with him.      Other Topics Concern   . Not on file     Social History Narrative   . No narrative on file     Expanded Substance History     Additional history       Objective:  Vitals:    01/27/17 1312   BP: 130/84   Weight: 64.4 kg (142 lb)       Physical Exam    Gen NAD BP 130/84  Wt 64.4 kg (142 lb)  LMP 01/17/2017  BMI 23.95 kg/m2      HEENT Normocephalic    Cardiovascular RRR    Respiratory CTA    Breasts: normal without  suspicious masses, skin or nipple changes or axillary nodes, self-exam is taught and encouraged.    Abdomen soft, nt    Pelvic exam: exam chaperoned by nurse, normal vagina and vulva.UTERUS FEELS SLIGHTLY ENLARGED.  IT IS MOBILE.  IT FEELS UNIFORM AS WELL    Extremities no c/c/e    Neurological intact    Psychiatric in a good mood    Assessment    56 y.o. annual exam      Plan      ICD-10-CM    1. Well woman exam with routine gynecological exam Z01.419 CYTOPATHOLOGY-GYN (PAP AND HPV TESTS)   2. Screening mammogram, encounter for Z12.31 Mammo Bilateral Screening w/ Addl views per RAD ([facility] Performed)         I advised the patient that my concern is that there is a correlation between being on birth control pills and migraines with the patient having a stroke. I advised that if birth control pills are part of her management of migraines she is at a higher risk of stopping the   OCP and restarting them then she is to continue on them Dr. Jearld Shines prescribes her OCP.     Deloris Ping, MD 01/27/2017, 13:46

## 2017-02-10 LAB — HISTORICAL CYTOPATHOLOGY-GYN (PAP AND HPV TESTS)

## 2017-02-15 LAB — HUMAN PAPILLOMA VIRUS (HPV) BY PCR WITH HIGH RISK GENOTYPING (THINPREP)
HPV OTHER: NEGATIVE
HPV16 PCR: NEGATIVE
HPV18 PCR: NEGATIVE

## 2017-04-02 ENCOUNTER — Other Ambulatory Visit (INDEPENDENT_AMBULATORY_CARE_PROVIDER_SITE_OTHER): Payer: Self-pay | Admitting: Obstetrics & Gynecology

## 2017-04-04 ENCOUNTER — Other Ambulatory Visit (INDEPENDENT_AMBULATORY_CARE_PROVIDER_SITE_OTHER): Payer: Self-pay | Admitting: Obstetrics & Gynecology

## 2017-04-04 MED ORDER — NORETHINDRONE-E.ESTRADIOL TRIPHASIC 0.5 MG/0.75 MG/1 MG-35 MCG TABLET
1.0000 | ORAL_TABLET | Freq: Every day | ORAL | 11 refills | Status: DC
Start: 2017-04-04 — End: 2018-02-08

## 2017-04-04 NOTE — Telephone Encounter (Signed)
-----   Message from Janene Madeira sent at 04/04/2017 12:04 PM EDT -----  [name] Pt  Hello,  Pt is calling for a refill.  Pt states the medication is Nortrel 135.       [facility] [address] [place], [place]    [address] [place] [address]    Phone: [phone] Fax: [phone]    Not a 24 hour pharmacy; exact hours not known        Thank You  Janene Madeira

## 2017-04-04 NOTE — Telephone Encounter (Signed)
Sent to Dr. Laurann Montana for approval.     Brock Bad, MA  04/04/2017, 14:05

## 2017-08-11 ENCOUNTER — Other Ambulatory Visit (INDEPENDENT_AMBULATORY_CARE_PROVIDER_SITE_OTHER): Payer: Medicare (Managed Care)

## 2017-08-11 ENCOUNTER — Ambulatory Visit (INDEPENDENT_AMBULATORY_CARE_PROVIDER_SITE_OTHER): Payer: Medicare (Managed Care) | Admitting: Obstetrics & Gynecology

## 2017-11-10 ENCOUNTER — Other Ambulatory Visit (INDEPENDENT_AMBULATORY_CARE_PROVIDER_SITE_OTHER): Payer: Medicare Other

## 2017-11-11 ENCOUNTER — Ambulatory Visit (INDEPENDENT_AMBULATORY_CARE_PROVIDER_SITE_OTHER): Payer: Medicare (Managed Care) | Admitting: Obstetrics & Gynecology

## 2018-02-08 ENCOUNTER — Other Ambulatory Visit (INDEPENDENT_AMBULATORY_CARE_PROVIDER_SITE_OTHER): Payer: Self-pay | Admitting: Obstetrics & Gynecology

## 2018-02-08 MED ORDER — NORETHINDRONE-E.ESTRADIOL TRIPHASIC 0.5 MG/0.75 MG/1 MG-35 MCG TABLET
1.0000 | ORAL_TABLET | Freq: Every day | ORAL | 11 refills | Status: DC
Start: 2018-02-08 — End: 2018-11-10

## 2018-02-08 NOTE — Telephone Encounter (Signed)
-----   Message from Bethena Midget sent at 02/07/2018 11:38 AM EDT -----  Laurann Montana Pt  Hello,  The pt called stating that she needs a refill on one of her rx.    Norethin-Eth Estrad Triphasic (equiv to: ORTHO NOVUM 777) 0.5/0.75/1 mg- 35 mcg Oral Tablet    Preferred Pharmacy     [facility] [address] [place], [place]    [address] [place] [address]    Phone: [phone] Fax: [phone]    Not a 24 hour pharmacy; exact hours not known.      Thank you.,

## 2018-02-08 NOTE — Telephone Encounter (Signed)
Pt requested refill on birth control. Reordered medication and routed to Dr. Laurann Montana to review.    Jonnie Kind, MA

## 2018-03-30 ENCOUNTER — Ambulatory Visit (INDEPENDENT_AMBULATORY_CARE_PROVIDER_SITE_OTHER): Payer: Medicare (Managed Care) | Admitting: Obstetrics & Gynecology

## 2018-03-30 ENCOUNTER — Other Ambulatory Visit (INDEPENDENT_AMBULATORY_CARE_PROVIDER_SITE_OTHER): Payer: Medicare (Managed Care)

## 2018-03-30 ENCOUNTER — Encounter (INDEPENDENT_AMBULATORY_CARE_PROVIDER_SITE_OTHER): Payer: Self-pay | Admitting: Obstetrics & Gynecology

## 2018-03-30 VITALS — BP 150/72 | Ht 66.0 in | Wt 150.7 lb

## 2018-03-30 DIAGNOSIS — R232 Flushing: Secondary | ICD-10-CM

## 2018-03-30 DIAGNOSIS — N888 Other specified noninflammatory disorders of cervix uteri: Secondary | ICD-10-CM

## 2018-03-30 DIAGNOSIS — G43909 Migraine, unspecified, not intractable, without status migrainosus: Secondary | ICD-10-CM

## 2018-03-30 DIAGNOSIS — D251 Intramural leiomyoma of uterus: Secondary | ICD-10-CM

## 2018-03-30 DIAGNOSIS — N854 Malposition of uterus: Secondary | ICD-10-CM

## 2018-03-30 DIAGNOSIS — N852 Hypertrophy of uterus: Secondary | ICD-10-CM

## 2018-03-30 DIAGNOSIS — D259 Leiomyoma of uterus, unspecified: Secondary | ICD-10-CM

## 2018-03-30 NOTE — Progress Notes (Signed)
[facility] Department of Obstetric & Gynecology              CC:  Enlarged uterus    HPI: Coretha Creswell is a 57 y.o. female here for enlarged uterus.  This visit was initially to follow up on enlarged uterus discovered on bimanual exam.  Patient had an ultrasound performed prior to the visit and was reviewed with the patient.  Majority of the visit was spent discussing hormones hormone status and what sounds to be hot flashes however the patient does says she has periods where she can't control her temperature.  Also discussing her migraines and where she is at there.  Her migraines have improved after seeing Dr. Danise Mina.  She is on injectable monoclonal antibodies which she gets monthly.  Between that and the continuous birth control pills she is down to 1 week a month of headaches as opposed to 3 weeks a month of headaches.  She complains of feeling hot most of the time.  She states they are not hot flashes.  She recently saw Dr. Jearld Shines and he tested her thyroid which was normal.  She is on continuous OCPs a so hormone workup will not be accurate.    Review of Systems -   Constitutional: Negative  Skin: Negative  HEENT: Negative  Cardiovascular: Negative  Respiratory: Negative  Gastrointestinal: Negative  Genitourinary: Negative  Musculoskeletal: Negative  Neurological: Negative  Psychiatric: Negative    Past Medical History:   Diagnosis Date   . Angiomyolipoma of kidney    . Asthma    . CVA (cerebrovascular accident) ([facility] [place])    . Environmental allergies    . Epilepsy ([facility] [place])    . HTN    . Hypothyroid    . IBS (irritable bowel syndrome)    . Insomnia    . Migraines            Current Outpatient Medications   Medication Sig   . aspirin 81 mg Oral Tablet, Chewable Take 1 Tab (81 mg total) by mouth Once a day   . budesonide/formoterol fumarate (SYMBICORT INHL) Take by inhalation   . ferrous sulfate (IRON) 325 mg (65 mg iron) Oral Tablet take 325 mg by mouth Once a day.     . fremanezumab-vfrm (AJOVY)  225 mg/1.5 mL Subcutaneous Syringe 225 mg by Subcutaneous route Every 30 days   . levothyroxine (SYNTHROID) 25 mcg Oral Tablet Take 75 mcg by mouth Once a day    . Norethin-Eth Estrad Triphasic (equiv to: ORTHO NOVUM 777) 0.5/0.75/1 mg- 35 mcg Oral Tablet Take 1 Tab by mouth Once a day   . pravastatin (PRAVACHOL) 40 mg Oral Tablet Take 1 Tab (40 mg total) by mouth QPM   . rizatriptan benzoate (MAXALT ORAL) Take by mouth   . topiramate (TOPAMAX) 100 mg Oral Tablet Take 300 mg by mouth Twice daily   . VENTOLIN INHL take by inhalation. As needed       Past Surgical History:   Procedure Laterality Date   . HX HAND SURGERY             Allergies   Allergen Reactions   . Compazine [Prochlorperazine Edisylate] Seizure   . Haldol [Haloperidol]    . Periactin [Cyproheptadine] Anaphylaxis   . Phenergan [Promethazine] Seizure   . Reglan [Metoclopramide] Seizure       Social History     Socioeconomic History   . Marital status: Single     Spouse name: Not on file   .  Number of children: Not on file   . Years of education: Not on file   . Highest education level: Not on file   Occupational History   . Not on file   Social Needs   . Financial resource strain: Not on file   . Food insecurity:     Worry: Not on file     Inability: Not on file   . Transportation needs:     Medical: Not on file     Non-medical: Not on file   Tobacco Use   . Smoking status: Current Some Day Smoker     Packs/day: 0.10     Years: 3.00     Pack years: 0.30     Types: Cigarettes   . Smokeless tobacco: Never Used   . Tobacco comment: "maybe about a pack a month, on and off for 3 years"   Substance and Sexual Activity   . Alcohol use: No   . Drug use: No     Comment: past use of marijuana in college   . Sexual activity: Yes     Partners: Male     Birth control/protection: Pill     Comment: has a boyfriend and is monogamous with him.    Lifestyle   . Physical activity:     Days per week: Not on file     Minutes per session: Not on file   . Stress: Not on file    Relationships   . Social connections:     Talks on phone: Not on file     Gets together: Not on file     Attends religious service: Not on file     Active member of club or organization: Not on file     Attends meetings of clubs or organizations: Not on file     Relationship status: Not on file   . Intimate partner violence:     Fear of current or ex partner: Not on file     Emotionally abused: Not on file     Physically abused: Not on file     Forced sexual activity: Not on file   Other Topics Concern   . Not on file   Social History Narrative   . Not on file       Family Medical History:     Problem Relation (Age of Onset)    Elevated Lipids Mother    Heart Attack Maternal Grandfather (70), Maternal Grandmother (70)    Hypertension (High Blood Pressure) Maternal Grandmother, Mother              Objective:  Vitals:    03/30/18 0948   BP: (!) 150/72   Weight: 68.4 kg (150 lb 11.2 oz)   Height: 1.676 m (5\' 6" )   BMI: 24.37           Physical Exam    Gen NAD BP (!) 150/72   Ht 1.676 m (5\' 6" )   Wt 68.4 kg (150 lb 11.2 oz)   BMI 24.32 kg/m           HEENT Normocephalic    Cardiovascular RRR    Respiratory CTA    Abdomen soft, nt    Extremities no c/c/e    Neurological intact    Psychiatric in a good mood    Ultrasound today showed very enlarged uterus that well over 100 cc.  Seven fibroids were within the uterus.  Right ovary normal left ovary on seen.  Assessment      ICD-10-CM    1. Uterine leiomyoma, unspecified location D25.9    2. Enlarged uterus N85.2    3. Migraines G43.909    4. Hot flashes R23.2      Uterus is enlarged.  The endometrium is not thickened.  There is several myomas.  The patient is somewhat symptomatic but not enough to intervene surgically  Her migraines are stable at this time on the current regimen of continuous birth control and new injections.  Would not adjust this regimen as it is taking some time to get her stabilized with this regard.  In order to evaluate her "hot flashes" we  would have to stop her birth control pills and draw the labs and really would not change our current management.  She may be entering menopause however discontinuing her current therapy to confirm this would not be advised it would likely not change management following results    Plan  Return for Annual.      Deloris Ping, MD 03/30/2018, 13:29

## 2018-06-06 ENCOUNTER — Ambulatory Visit (HOSPITAL_BASED_OUTPATIENT_CLINIC_OR_DEPARTMENT_OTHER): Payer: Self-pay | Admitting: Plastic Surgery

## 2018-06-07 ENCOUNTER — Telehealth (HOSPITAL_BASED_OUTPATIENT_CLINIC_OR_DEPARTMENT_OTHER): Payer: Self-pay | Admitting: Plastic Surgery

## 2018-06-07 ENCOUNTER — Ambulatory Visit (HOSPITAL_BASED_OUTPATIENT_CLINIC_OR_DEPARTMENT_OTHER): Payer: Self-pay | Admitting: Plastic Surgery

## 2018-06-07 NOTE — Telephone Encounter (Signed)
Patient called to cancel her appointment.  I asked if she wanted to reschedule and she said she would call back to do so at a later date.

## 2018-07-11 ENCOUNTER — Encounter (HOSPITAL_COMMUNITY): Payer: Self-pay | Admitting: Emergency Medicine

## 2018-07-11 ENCOUNTER — Emergency Department (HOSPITAL_COMMUNITY)
Admission: EM | Admit: 2018-07-11 | Discharge: 2018-07-11 | Disposition: A | Discharge status: Home / Self Care | Payer: Medicare PPO | Attending: Emergency Medicine | Admitting: Emergency Medicine

## 2018-07-11 ENCOUNTER — Emergency Department (HOSPITAL_COMMUNITY): Payer: Medicare PPO

## 2018-07-11 DIAGNOSIS — R202 Paresthesia of skin: Secondary | ICD-10-CM

## 2018-07-11 DIAGNOSIS — Z79899 Other long term (current) drug therapy: Secondary | ICD-10-CM | POA: Insufficient documentation

## 2018-07-11 DIAGNOSIS — M545 Low back pain, unspecified: Secondary | ICD-10-CM

## 2018-07-11 DIAGNOSIS — M549 Dorsalgia, unspecified: Secondary | ICD-10-CM | POA: Diagnosis present

## 2018-07-11 IMAGING — DX DG LUMBAR SPINE COMPLETE 4+V
5 series · 5 of 5 positions shown · non-contrast
Comparison: [DATE]

CLINICAL DATA: Low back pain, foot numbness

EXAM:
LUMBAR SPINE - COMPLETE 4+ VIEW

[l-spine ap]
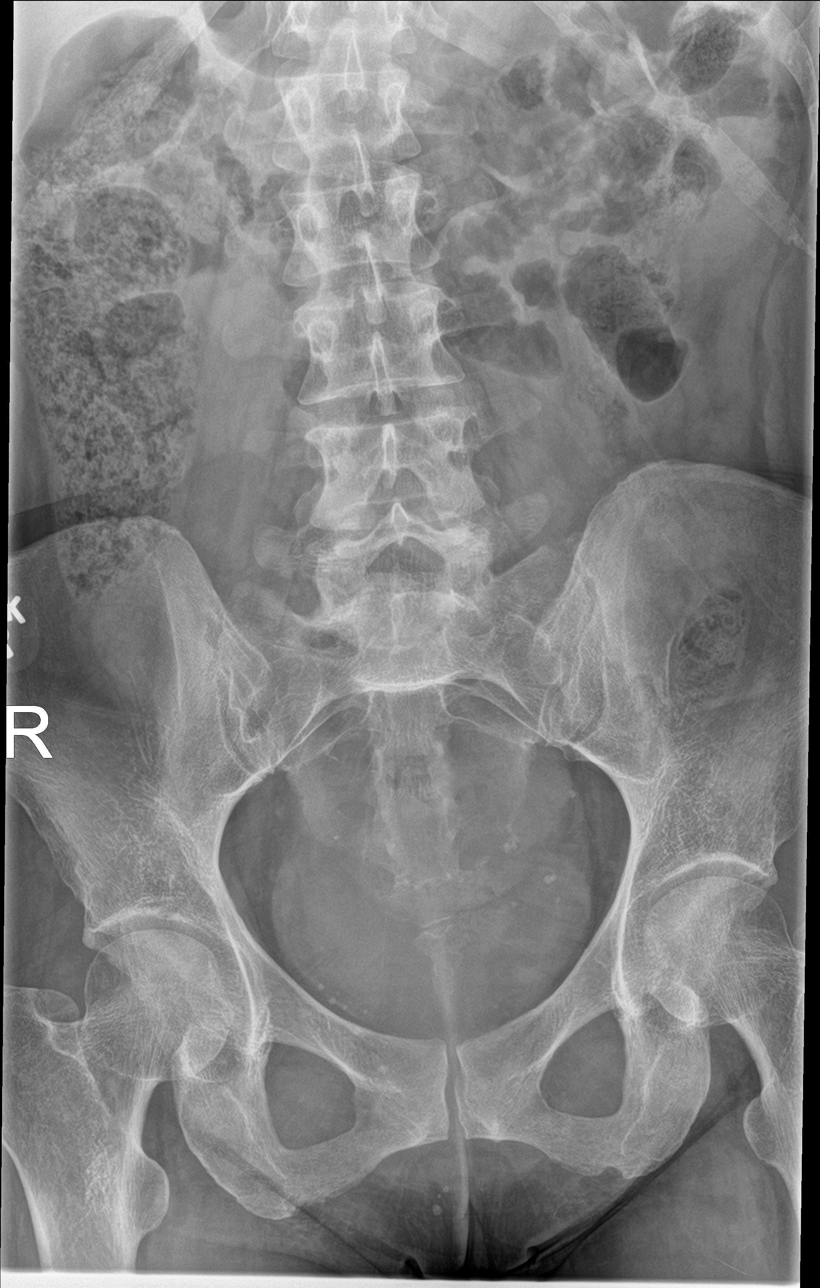

[l-spine obl (1 of 2)]
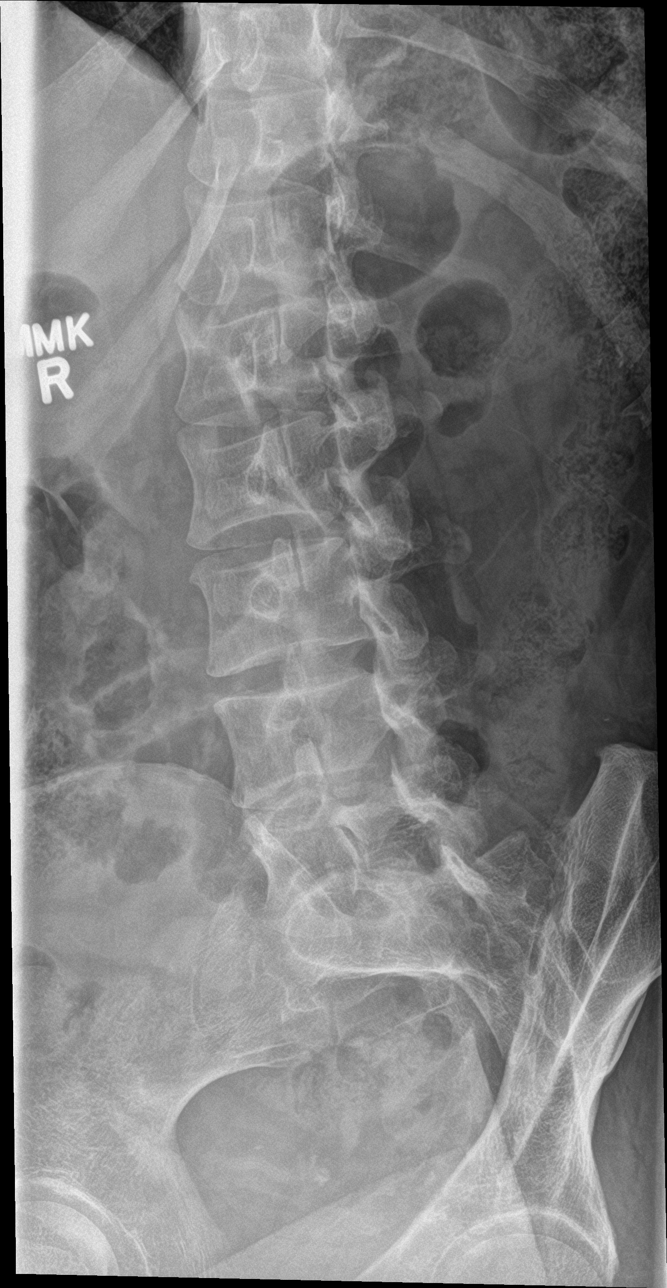

[l-spine obl (2 of 2)]
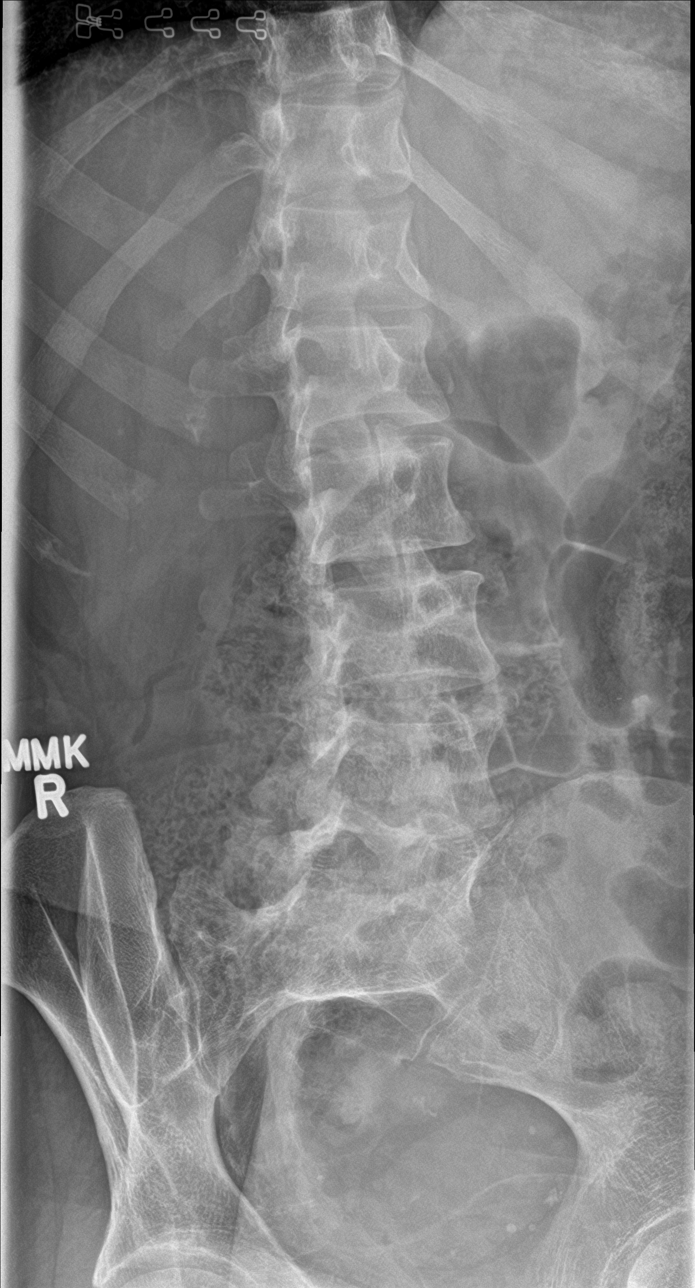

[l-spine lat]
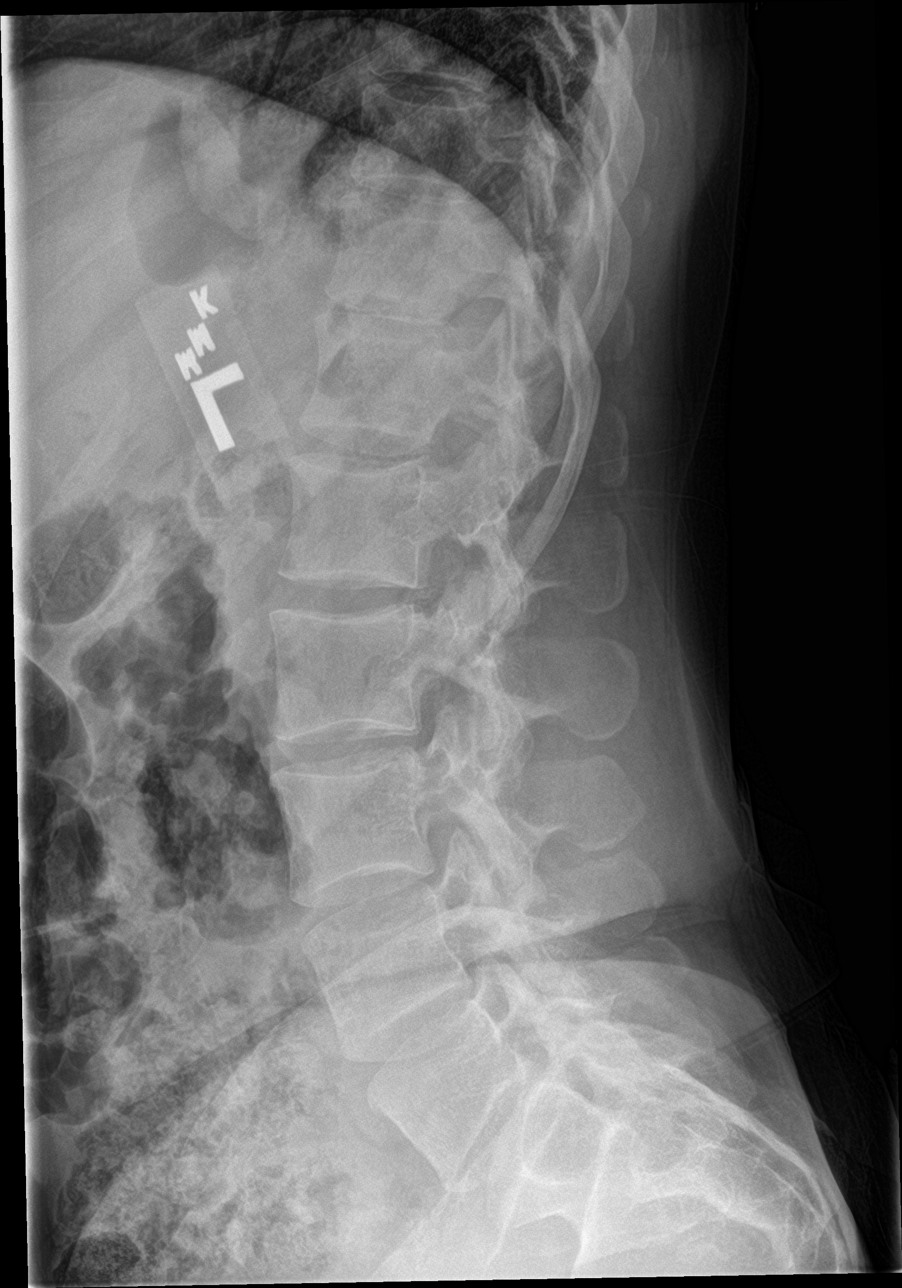

[l-spine spot]
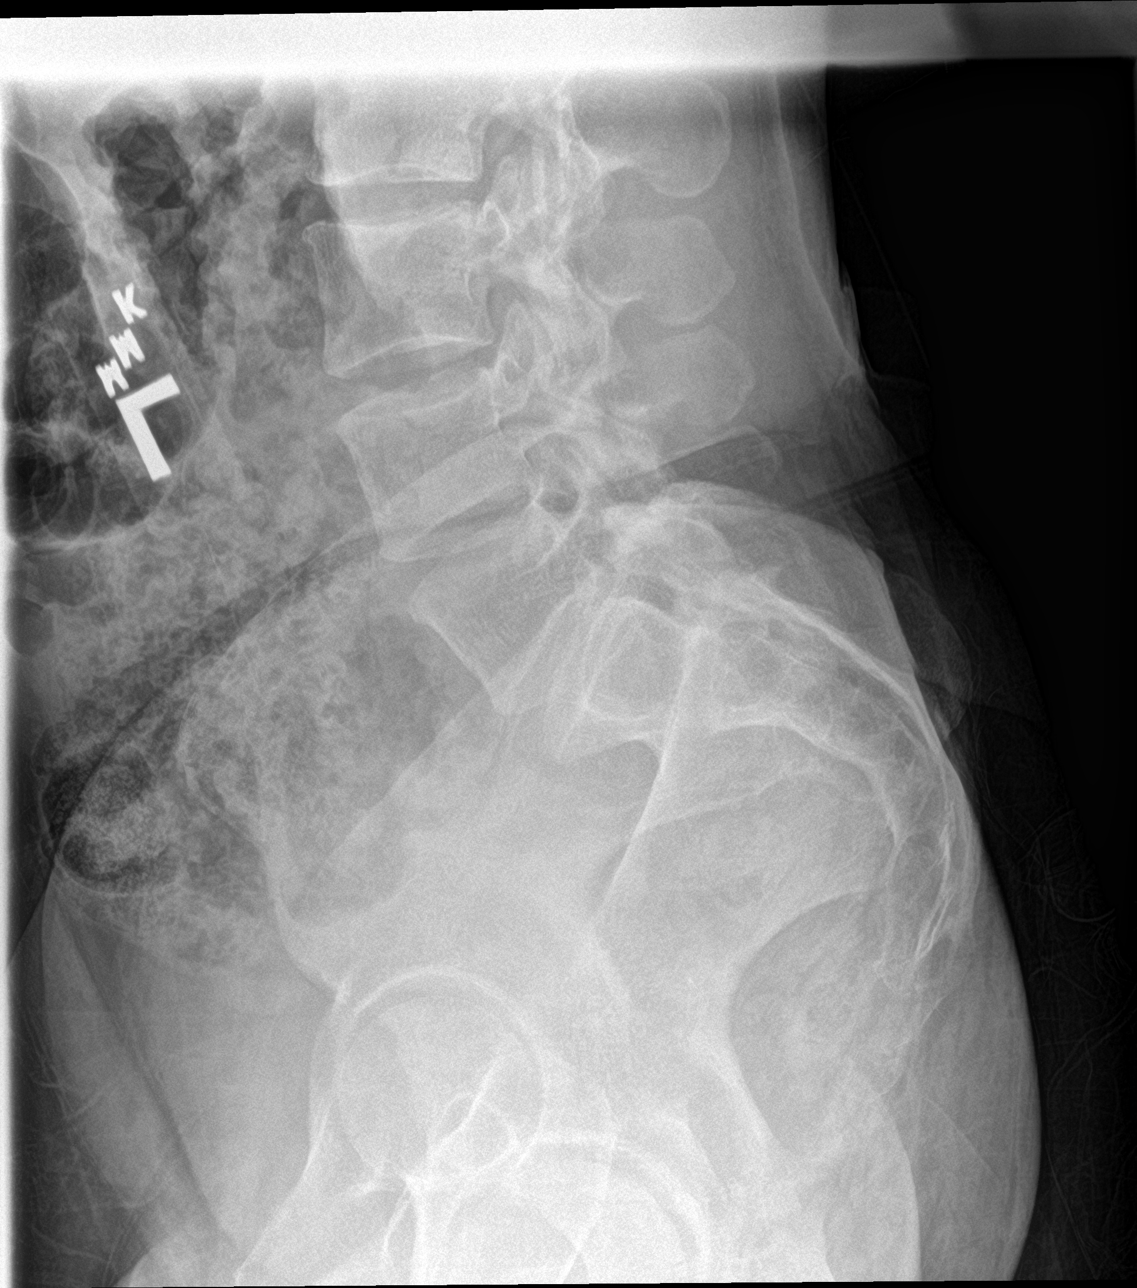

[5 of 5 positions shown; findings below may reference images not displayed]

FINDINGS: Normal alignment. Disc spaces maintained. No fracture. SI joints are
symmetric and unremarkable.
IMPRESSION: Negative.

## 2018-07-11 MED ORDER — MELOXICAM 7.5 MG PO TABS: 7.5000 mg | ORAL_TABLET | Freq: Every day | ORAL | 0 refills | Status: AC

## 2018-07-11 MED ORDER — METHOCARBAMOL 500 MG PO TABS
750.0000 mg | ORAL_TABLET | Freq: Once | ORAL | Status: AC
  Administered 2018-07-11: 750 mg via ORAL
  Filled 2018-07-11: qty 2

## 2018-07-11 MED ORDER — KETOROLAC TROMETHAMINE 15 MG/ML IJ SOLN
15.0000 mg | Freq: Once | INTRAMUSCULAR | Status: AC
  Administered 2018-07-11: 15 mg via INTRAMUSCULAR

## 2018-07-11 MED ORDER — LIDOCAINE 5 % EX PTCH
1.0000 | MEDICATED_PATCH | CUTANEOUS | Status: DC
  Administered 2018-07-11: 1 via TRANSDERMAL
  Filled 2018-07-11: qty 1

## 2018-07-11 MED ORDER — METHOCARBAMOL 500 MG PO TABS
500.0000 mg | ORAL_TABLET | Freq: Two times a day (BID) | ORAL | 0 refills | Status: DC
Start: 2018-07-11 — End: 2020-04-07

## 2018-07-11 MED ORDER — CYCLOBENZAPRINE HCL 10 MG PO TABS
10.0000 mg | ORAL_TABLET | Freq: Once | ORAL | Status: DC
  Filled 2018-07-11: qty 1
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
## 2018-07-11 NOTE — ED Provider Notes (Cosign Needed)
[facility] EMERGENCY DEPARTMENT Provider Note   CSN: [id number] Arrival date & time: 07/11/18  1755     History   Chief Complaint Chief Complaint  Patient presents with  . Back Pain    HPI Judy Meyer is a 57 y.o. female.  Duplicate chart created in error.     History reviewed. No pertinent past medical history.  There are no active problems to display for this patient.   History reviewed. No pertinent surgical history.   OB History   None      Home Medications    Prior to Admission medications   Medication Sig Start Date End Date Taking? Authorizing Provider  cetirizine-pseudoephedrine (ZYRTEC-D) 5-120 MG tablet Take 1 tablet by mouth 2 (two) times daily.   Yes [provider]  Ergotamine Tartrate (ERGOMAR SL) Place 2 mg under the tongue as needed (migraines).   Yes [provider]  Galcanezumab-gnlm (EMGALITY) 120 MG/ML SOAJ Inject 120 mLs into the skin every 30 (thirty) days. November 2019   Yes [provider]  levothyroxine (SYNTHROID, LEVOTHROID) 75 MCG tablet Take 75 mcg by mouth daily before breakfast.   Yes [provider]  norethindrone-ethinyl estradiol (CYCLAFEM,ALYACEN) 0.5/0.75/1-35 MG-MCG tablet Take 1 tablet by mouth daily.   Yes [provider]  rosuvastatin (CRESTOR) 5 MG tablet Take 5 mg by mouth daily.   Yes [provider]  topiramate (TOPAMAX) 100 MG tablet Take 100 mg by mouth 2 (two) times daily.   Yes [provider]  venlafaxine XR (EFFEXOR-XR) 75 MG 24 hr capsule Take 75 mg by mouth daily.   Yes [provider]  meloxicam (MOBIC) 7.5 MG tablet Take 1 tablet (7.5 mg total) by mouth daily for 10 days. 07/11/18 07/21/18  Tacy Learn, PA-C  methocarbamol (ROBAXIN) 500 MG tablet Take 1 tablet (500 mg total) by mouth 2 (two) times daily. 07/11/18   Tacy Learn, PA-C    Family History No family history on file.  Social History Social History    Tobacco Use  . Smoking status: Not on file  Substance Use Topics  . Alcohol use: Not on file  . Drug use: Not on file     Allergies   Compazine [prochlorperazine edisylate]; Periactin [cyproheptadine]; Cyclobenzaprine; Haloperidol and related; and Reglan [metoclopramide]   Review of Systems Review of Systems   Physical Exam Updated Vital Signs BP (!) 159/87   Pulse 73   Temp 98.5 F (36.9 C) (Oral)   Resp 16   Ht 5' 6.5" (1.689 m)   Wt 63.5 kg   LMP 06/11/2018   SpO2 100%   BMI 22.26 kg/m   Physical Exam   ED Treatments / Results  Labs (all labs ordered are listed, but only abnormal results are displayed) Labs Reviewed - No data to display  EKG None  Radiology No results found.  Procedures Procedures (including critical care time)  Medications Ordered in ED Medications  ketorolac (TORADOL) 15 MG/ML injection 15 mg (15 mg Intramuscular Given 07/11/18 2101)  methocarbamol (ROBAXIN) tablet 750 mg (750 mg Oral Given 07/11/18 2123)     Initial Impression / Assessment and Plan / ED Course  I have reviewed the triage vital signs and the nursing notes.  Pertinent labs & imaging results that were available during my care of the patient were reviewed by me and considered in my medical decision making (see chart for details).  Clinical Course as of Jul 15 2003  Tue Dec 03,  440  [id number] 57 year old female presents with complaint of left lower back pain without injury as well as tingling in her foot.  On exam patient has tenderness left lower back as well as left and right trapezius areas, there is no midline or bony tenderness.  Axes and leg strength are symmetric.  Reports decrease in sensation to the ball of the foot at the second through fifth MTPs as well as dorsum of the foot extending proximally to the ankle.  No foot or toe weakness noted.  X-ray is unremarkable.  Recommend conservative therapy at this time with muscle relaxer and NSAID, warm compresses and OTC  lidocaine patches.  Referred to local PCP for follow-up as needed while she is in town otherwise may follow-up with her PCP.   [LM]  Fri Jul 14, 4853  [id number] Duplicate chart created in error.   [LM]    Clinical Course User Index [LM] Tacy Learn, PA-C   Final Clinical Impressions(s) / ED Diagnoses   Final diagnoses:  Acute left-sided low back pain without sciatica  Paresthesia of left foot    ED Discharge Orders         Ordered    methocarbamol (ROBAXIN) 500 MG tablet  2 times daily     07/11/18 2211    meloxicam (MOBIC) 7.5 MG tablet  Daily     07/11/18 2211           Tacy Learn, PA-C 07/14/18 2004

## 2018-07-11 NOTE — ED Triage Notes (Signed)
Pt reports ongoing back pain, today she had three sharp pains that are going down her spine.  The bottom of her left foot is numb.  Pt has numerous complaints one of which is a headache.  Pt walked into triage

## 2018-07-11 NOTE — Discharge Instructions (Signed)
Your x-ray today is normal, you may proceed with conservative management as discussed.  Take meloxicam and Robaxin as needed as prescribed. Apply over-the-counter lidocaine patches to your low back as needed as directed. Apply warm compresses to the low back for 20 minutes at a time. Short period of rest followed by return to light activity. Follow-up with your primary care provider, referral given to local primary care if needed.

## 2018-07-27 ENCOUNTER — Ambulatory Visit (INDEPENDENT_AMBULATORY_CARE_PROVIDER_SITE_OTHER): Payer: Self-pay | Admitting: Obstetrics & Gynecology

## 2018-09-18 ENCOUNTER — Telehealth (HOSPITAL_BASED_OUTPATIENT_CLINIC_OR_DEPARTMENT_OTHER): Payer: Self-pay | Admitting: Plastic Surgery

## 2018-09-18 NOTE — Telephone Encounter (Signed)
I called patient per Teresa Ramirez to see if the patient could come into clinic any earlier than 3pm. LMOM for patient to return phone call.

## 2018-09-19 ENCOUNTER — Ambulatory Visit (HOSPITAL_BASED_OUTPATIENT_CLINIC_OR_DEPARTMENT_OTHER): Payer: Self-pay | Admitting: Plastic Surgery

## 2018-09-19 ENCOUNTER — Telehealth (HOSPITAL_BASED_OUTPATIENT_CLINIC_OR_DEPARTMENT_OTHER): Payer: Self-pay | Admitting: Plastic Surgery

## 2018-09-19 NOTE — Telephone Encounter (Signed)
Patient called and stated that she needed to cancel her appointment this afternoon due to a migrane.  She stated she would call back to reschedule when she feels better.

## 2018-09-28 ENCOUNTER — Other Ambulatory Visit (HOSPITAL_COMMUNITY): Payer: Self-pay | Admitting: FAMILY PRACTICE

## 2018-09-28 DIAGNOSIS — Z1231 Encounter for screening mammogram for malignant neoplasm of breast: Secondary | ICD-10-CM

## 2018-11-09 ENCOUNTER — Other Ambulatory Visit (INDEPENDENT_AMBULATORY_CARE_PROVIDER_SITE_OTHER): Payer: Self-pay | Admitting: Obstetrics & Gynecology

## 2018-11-10 NOTE — Telephone Encounter (Signed)
Refill request routed to provider.     Jonnie Kind, MA

## 2018-12-19 ENCOUNTER — Ambulatory Visit (INDEPENDENT_AMBULATORY_CARE_PROVIDER_SITE_OTHER): Payer: Medicare (Managed Care)

## 2018-12-19 ENCOUNTER — Encounter (FREE_STANDING_LABORATORY_FACILITY)
Admit: 2018-12-19 | Discharge: 2018-12-19 | Disposition: A | Discharge status: Home / Self Care | Payer: Medicare (Managed Care) | Attending: Emergency Medicine | Admitting: Emergency Medicine

## 2018-12-19 ENCOUNTER — Other Ambulatory Visit: Payer: Self-pay

## 2018-12-19 ENCOUNTER — Encounter (FREE_STANDING_LABORATORY_FACILITY): Payer: Medicare (Managed Care) | Admitting: Emergency Medicine

## 2018-12-19 ENCOUNTER — Encounter (INDEPENDENT_AMBULATORY_CARE_PROVIDER_SITE_OTHER): Payer: Self-pay

## 2018-12-19 VITALS — BP 131/75 | HR 90 | Temp 98.6°F | Resp 24 | Ht 66.0 in | Wt 140.0 lb

## 2018-12-19 DIAGNOSIS — R059 Cough, unspecified: Secondary | ICD-10-CM

## 2018-12-19 DIAGNOSIS — J45901 Unspecified asthma with (acute) exacerbation: Secondary | ICD-10-CM

## 2018-12-19 DIAGNOSIS — Z03818 Encounter for observation for suspected exposure to other biological agents ruled out: Secondary | ICD-10-CM

## 2018-12-19 DIAGNOSIS — G43909 Migraine, unspecified, not intractable, without status migrainosus: Secondary | ICD-10-CM

## 2018-12-19 DIAGNOSIS — R05 Cough: Secondary | ICD-10-CM

## 2018-12-19 MED ORDER — KETOROLAC 30 MG/ML (1 ML) INJECTION SOLUTION
30.0000 mg | Freq: Once | INTRAMUSCULAR | 0 refills | Status: AC
Start: 2018-12-19 — End: 2018-12-19

## 2018-12-19 MED ORDER — ALBUTEROL SULFATE HFA 90 MCG/ACTUATION AEROSOL INHALER
2.0000 | INHALATION_SPRAY | RESPIRATORY_TRACT | 0 refills | Status: DC | PRN
Start: 2018-12-19 — End: 2021-05-28

## 2018-12-19 MED ORDER — METHYLPREDNISOLONE SOD SUCC (PF) 125 MG/2 ML SOLUTION FOR INJECTION
125.0000 mg | Freq: Once | INTRAMUSCULAR | 0 refills | Status: AC
Start: 2018-12-19 — End: 2018-12-19

## 2018-12-19 MED ORDER — IPRATROPIUM 0.5 MG-ALBUTEROL 3 MG (2.5 MG BASE)/3 ML NEBULIZATION SOLN
3.0000 mL | INHALATION_SOLUTION | Freq: Once | RESPIRATORY_TRACT | 0 refills | Status: DC
Start: 2018-12-19 — End: 2019-04-19

## 2018-12-19 MED ORDER — PREDNISONE 10 MG TABLET
ORAL_TABLET | ORAL | 0 refills | Status: DC
Start: 2018-12-19 — End: 2019-05-21

## 2018-12-19 MED ORDER — BUDESONIDE-FORMOTEROL HFA 160 MCG-4.5 MCG/ACTUATION AEROSOL INHALER
2.00 | INHALATION_SPRAY | Freq: Two times a day (BID) | RESPIRATORY_TRACT | 0 refills | Status: DC
Start: 2018-12-19 — End: 2021-05-28

## 2018-12-19 NOTE — Nursing Note (Signed)
12/19/18 0934 12/19/18 0935   Nursing Procedures / Med Admin   Medications E-M  --  Methyprednisolone sodium succinate(Solu-Medrol)   Medications N-Z Toradol  30mg /106ml  --    Medication Dose 30 mg 125 mg   Route of Administration IM IM   Time of Administration 0935 0936   Site Right Gluteus Left Gluteus   [facility] # [id number] [phone]   LOT # [id number]   Expiration date 09/08/18 08/08/20   Manufacturer [place] Yes Yes   Comments: Patient tolerated well Patient tolerated well   Initials SAB SAB     Patient with orders for Toradol and Solumedrol to be administered in urgent care today per order Dr Valere Dross. Toradol and Solumedrol administered as ordered. Patient tolerated both well. No bleeding noted at either site. Band-Aids placed over site. Patient remains in urgent care for 15 minutes post administration. Carmine Savoy, RN  12/19/2018, 09:59

## 2018-12-19 NOTE — Nursing Note (Signed)
Patient presents to urgent care with difficulty breathing and history of asthma. She reports she is out of her inhaler and has been having trouble breathing since 0500. Dr Valere Dross comes to room and orders to place patient on 02 via nasal canula and administer duoneb at this time. Patient placed on 2L NC and duoneb administered as ordered. Patient tolerated well. See flowsheet for [place], RN  12/19/2018, 09:23

## 2018-12-19 NOTE — Nursing Note (Signed)
12/19/18 0915   Nursing Procedures / Med Admin   Medications A-D DuoNeb, Combivent 36ml   Medication Dose 3 ml   Route of Administration NEBULIZER   Time of Administration 0909   [facility] # [id number]   LOT # [id number]   Expiration date 03/08/20   Manufacturer [employer]   Clinic Supplied Yes   Comments: Patient tolerated well   Initials SAB     Patient nebulizer treatment teaching complete.  Patient states understanding and tolerating treatment well. Will continue to monitor. Repeat VS documented on flowsheet. Carmine Savoy, RN 12/19/2018, 09:26

## 2018-12-19 NOTE — Progress Notes (Signed)
History of Present Illness: Sefora Tietje is a 58 y.o. female who presents to the Urgent Care today with chief complaint of    Chief Complaint            Asthma Difficulty breathing since 0500, history of asthma. Hasn't felt well for a few days, headache over past 3 days, sore throat for 1 week. Chills on and off, cough for 2 months.         Patient presents with complaint of shortness of breath onset 5 am this morning. She is out of her ventolin and Symbicort. She has not taken any medication for her symptoms. She states that she also has a migraine but she has a history of migraines and it is being exacerbated by her asthma attack. She does not have abortive medication for her migraines. She admits that she has not felt well for a few days. She also admits that she has a chronic cough for the last 2 months and chills for the last 2 days. Denies any fever.     I reviewed and confirmed the patient's past medical history taken by the nurse or medical assistant with the addition of the following:    Past Medical History:    Past Medical History:   Diagnosis Date   . Angiomyolipoma of kidney    . Asthma    . CVA (cerebrovascular accident) (CMS [place])    . Environmental allergies    . Epilepsy (CMS [place])    . HTN    . Hypothyroid    . IBS (irritable bowel syndrome)    . Insomnia    . Migraines          Past Surgical History:    Past Surgical History:   Procedure Laterality Date   . HX HAND SURGERY           Allergies:  Allergies   Allergen Reactions   . Compazine [Prochlorperazine Edisylate] Seizure   . Haldol [Haloperidol]    . Periactin [Cyproheptadine] Anaphylaxis   . Phenergan [Promethazine] Seizure   . Reglan [Metoclopramide] Seizure     Medications:    Current Outpatient Medications   Medication Sig   . albuterol sulfate (PROAIR HFA) 90 mcg/actuation Inhalation HFA Aerosol Inhaler Take 2 Puffs by inhalation Every 4 hours as needed   . aspirin 81 mg Oral Tablet, Chewable Take 1 Tab (81 mg total) by mouth Once a  day   . budesonide/formoterol fumarate (SYMBICORT INHL) Take by inhalation   . ferrous sulfate (IRON) 325 mg (65 mg iron) Oral Tablet take 325 mg by mouth Once a day.     . fremanezumab-vfrm (AJOVY) 225 mg/1.5 mL Subcutaneous Syringe 225 mg by Subcutaneous route Every 30 days   . ketorolac (TORADOL) 30 mg/mL (1 mL) Injection Solution 1 mL (30 mg total) by IntraMUSCULAR route One time for 1 dose   . levothyroxine (SYNTHROID) 25 mcg Oral Tablet Take 75 mcg by mouth Once a day    . methylPREDNISolone sod succ (SOLU-MEDROL, PF,) 125 mg/2 mL Injection Recon Soln 2 mL (125 mg total) by IntraMUSCULAR route One time for 1 dose   . NORTREL 7/7/7, 28, 0.5/0.75/1 mg- 35 mcg Oral Tablet TAKE 1 TABLET BY MOUTH EVERY DAY   . pravastatin (PRAVACHOL) 40 mg Oral Tablet Take 1 Tab (40 mg total) by mouth QPM   . predniSONE (DELTASONE) 10 mg Oral Tablet Take 4 tabs x 3 days, 3 tabs x 3 days, 2 tabs x 3 days,  1 tab x 3 days   . rizatriptan benzoate (MAXALT ORAL) Take by mouth   . topiramate (TOPAMAX) 100 mg Oral Tablet Take 300 mg by mouth Twice daily   . VENTOLIN INHL take by inhalation. As needed     Social History:    Social History     Tobacco Use   . Smoking status: Current Some Day Smoker     Packs/day: 0.10     Years: 3.00     Pack years: 0.30     Types: Cigarettes   . Smokeless tobacco: Never Used   . Tobacco comment: "maybe about a pack a month, on and off for 3 years"   Substance Use Topics   . Alcohol use: No   . Drug use: No     Comment: past use of marijuana in college     Family History: No significant family history.  Family Medical History:     Problem Relation (Age of Onset)    Elevated Lipids Mother    Heart Attack Maternal Grandfather (70), Maternal Grandmother (70)    Hypertension (High Blood Pressure) Maternal Grandmother, Mother            Review of Systems:    General: no fever, no myalgias and no fatigue  ENT:  no sore throat, no otalgia right and no otalgia left  Pulmonary:   dry cough, wheezing, SOB and no  hemoptysis  All other review of systems were negative    Physical Exam:  Vital signs:   Vitals:    12/19/18 0911 12/19/18 0916   BP: (!) 180/91 131/75   Pulse: (!) 111 99   Resp: (!) 38 (!) 24   Temp:  37 C (98.6 F)   TempSrc:  Thermal Scan   SpO2: 95% 99%   Weight: 63.5 kg (140 lb)    Height: 1.676 m (5\' 6" )    BMI: 22.64          Body mass index is 22.6 kg/m. Facility age limit for growth percentiles is 20 years.  Patient's last menstrual period was 12/06/2018 (approximate).    General:  Well appearing and No acute distress  Head:  Normocephalic and Atraumatic  Eyes:  Normal lids/lashes  ENT:  normal EAC's, normal TM's, MMM and normal pharynx/tonsils  Neck:  supple  Pulmonary:  accessory muscle use, bilaterally expiratory wheezing, no rhonchi UPDATE: after nebulizer, no longer using accessory muscles, decreased wheezing, increased air movement, no rhonchi  Cardiovascular:  regular rate/rhythm, normal S1/S2 and no murmur/rub/gallop  Skin:  warm/dry and no rash  Psychiatric:  Appropriate affect and behavior  Neurologic:   Alert and oriented x 3     Data Reviewed:    Labs: COVID 19    Course: Condition at discharge: Good     Differential Diagnosis: asthma exacerbation vs viral syndrome vs migraine    Assessment:   1. Exacerbation of asthma, unspecified asthma severity, unspecified whether persistent    2. Cough    3. Migraine        Plan:    Orders Placed This Encounter   . COVID-19 SCREENING - SEND-OUT   . ketorolac (TORADOL) 30 mg/mL (1 mL) Injection Solution   . methylPREDNISolone sod succ (SOLU-MEDROL, PF,) 125 mg/2 mL Injection Recon Soln   . predniSONE (DELTASONE) 10 mg Oral Tablet   . albuterol sulfate (PROAIR HFA) 90 mcg/actuation Inhalation HFA Aerosol Inhaler     Medications A-D: DuoNeb, Combivent 7ml  Medications E-M: Methyprednisolone sodium succinate(Solu-Medrol)  Medications N-Z: Toradol  30mg /56ml  Route of Administration: IM  Site: Left Gluteus  [facility] #: [id number]  LOT #: [id number]  Expiration date:  08/08/20  Manufacturer: [facility] Supplied: Yes  Comments:: Patient tolerated well    Will screen due to high risk patient.  Toradol and solumedrol administered in clinic. Patient tolerated well.   After nebulizer symptoms drastically improved.  Start steroid taper tomorrow.  Refilled inhalers but advised to follow up with PCP for more refills.     Go to Emergency Department immediately for further work up if any concerning symptoms.  Plan was discussed and patient verbalized understanding.  If symptoms are worsening or not improving the patient should return to the Urgent Care for further evaluation.    Newton Pigg, DO 12/19/2018, 09:50

## 2018-12-19 NOTE — Patient Instructions (Signed)
Asthma  What is asthma?  Asthma is a long-term (chronic) ?lung disease.The airways react to triggers (allergens and irritants). This makes it hard to breathe. With exposure to triggers, these changes occur:    The airways become swollen and inflamed.   The muscles around the airways tighten.   Moremucus is made. This leads to mucus plugs.  All of these changes make the airways narrow. This makes it hard for air to go out of the lungs. And fresh oxygen can't get into the body.   What causes asthma?  Experts don't know the exact cause of asthma. They believe it is partly inherited.The environment, infections, and chemicals released by the body also play a role.   Exercise causes symptoms in many people with asthma. Symptoms can occur during exercise. They can also occur right after exercise. In some people, stress or strong feelings can cause symptoms.   All of these may be asthma triggers:   Allergens Respiratory problem       Pollens (trees, grasses, and weeds)   Mold   Pets   Dust and dust mites   Cockroaches   Mice     Nasal allergies   Sinus infections   The flu   Viral infections, including the common cold   Irritants Medicines       Strong odors from perfumes, cleaners, cooking, paints, and varnishes   Chemicals (gases, fumes)   Air pollution   Changing weather (temperature, barometric pressure, humidity, and strong winds)   Smoke (tobacco-inhaled or secondhand)     Aspirin   NSAIDs (nonsteroidal anti-inflammatory drugs) such as ibuprofen   Other conditions         GERD (gastroesophageal reflux)   Sleep apnea   Overweight   Depression     Other         Exercise, especially in cold weather   Strong feelings that go along with laughing or crying     Who is at risk for asthma?  It is most common in:    Children and teens ages 5 to17   Peopleliving in cities  Other factors include:   Personal or family history of asthma or allergies   Exposure to secondhand tobacco  smoke   Children with a family history of asthma   Children who have allergies or atopic dermatitis   Children exposed to secondhand and tobacco smoke  What are the symptoms of asthma?  Symptoms include:   Trouble breathing or shortness of breath   Chest tightness   Wheezing or a whistling sound when breathing   Coughing   Breathing becomes harder and may hurt   Talking and sleeping may be harder with severe symptoms  How is asthma diagnosed?  Your healthcare provider will ask about your health history. He or she will give you a physical exam. You will also have other tests. An important test is spirometry.   A spirometer is a device used to find out how well thelungs are working. Itmeasures the amount and speed of air breathed out.   You may have other tests. These are done to check for conditions such as allergies.   How is asthma treated?  Treatment will depend on your symptoms, age, and general health. It will also depend on how severe the condition is.   There is no cure for asthma. Itcanoften be controlled by staying away from triggers. And by taking medicines as prescribed by your healthcare provider.   Watching symptoms is a key   part of asthma care. So is knowing what to do if symptoms get worse. Experts advise making an Asthma Action Plan with your provider.   Medicines for asthma  The 2 types of asthma medicines arelong-term control and short-term (quick-relief) medicines. Long-term control medicines are often taken every day. They help prevent symptoms. Quick-relief medicines calm asthma symptoms fast. But they only last for a short time. You may take either type of medicinealone. Some people take both.   Your healthcare provider should regularly check and adjust your medicines as needed.   Long-term control medicines  At first, it may take a few weeks for long-term control medicines to work. You must take these medicines every day. These medicines include:    Anti-inflammatory  medicines.These medicines reduce or prevent airway swelling.   Bronchodilators. These relax muscles around the airways.   Leukotriene modifiers.These block the action of chemicals called leukotrienes. These are chemicals that cause airways to be inflamed and narrowed.   Anti-IgE(omalizumab).This medicine reduces allergic reactions. It is a shot (injection) given 1 or 2 times a month. It is often used when asthma is harder to control.   Anti-IL-5 (Interleukin-5) agents. These are given by injection in a provider's office. They block a chemical in the body called IL-5.  Quick-relief medicines  Quick-relief medicines quickly relax the muscles around the airways. But the relief only lasts about2 to 3 hours.   These medicines may include:   Inhaled short-acting beta2-agonists .  These help relax muscles around the airways.   Inhaled anticholinergics . These block a chemical in the body called acetylcholine. This chemical contracts the muscles. It also causes more mucusin the airways.  Inhalation devices for asthma  Inhaled medicines goright to the lungs. There have fewer side effects than medicines taken by mouth. Inhaledmedicinesmay be anti-inflammatory or bronchodilating. Or they may be both. The devices used are:    Metered-dose inhaler (MDI).This is the most common type of inhaler. It uses a chemical to push the medicine out of the inhaler. MDIs are held in front of or put into the mouth. Then the medicine is released in puffs. Or they may be used with a spacer device.   Nebulizer.This device sprays a fine mist of medicine. This is done through a mask usingair under pressure, or an ultrasonic machine. A mouthpiece or maskis connected to a machine by plastic tubing to deliver medicine.   Dry powder or rotary inhaler.These inhalers deliver powered medicine as you breathe.  Living with asthma  Staying away from triggers is key in managing asthma. Triggers may be allergens, irritants, other health  problems, exercise, medicines, and strong emotions.The following can help youlimit your exposure:   Allergies   Dust.Dust is the most common year-round allergen. The allergy is caused by tiny dust mites. Dust mites are found in mattresses, carpets, and fabric-covered (upholstered) furniture such as sofas and chairs. They live best in warm, humid conditions. It's important to limit your exposure. Take extra care in the bedroom. Put dust mite covers on your mattress, box spring, and pillows.   Pollens. You may be allergic to pollen. If so, during pollen season keep all car and house windows closed. Use air conditioning. If you are outside, shower, wash your hair, and change clothes when you go inside.   Pets.Pets that have fur or feathers often cause allergies. If you have pets, try not to touch them. If you do pet or handle them, wash your hands afterward. Keep pets off your bed   and out of your bedroom. Have someone brush and bathe your pet often.   Mold and mildew.These can trigger asthma. When outside, stay away from damp, shady areas. Use exhaust fans when cooking or bathing. Keep indoor humidity below 45%. And drain and clean your dehumidifier often.    Exercise  Exercise is a common asthma trigger. But don't limit sports or exercise unless a healthcare provider tells you to. Exercise is good for your health and lungs. Swimming, golf, and karate are good choices if you have asthma. Always warm up before exercise. And cool down after. Ask your provider about using your quick-relief medicine before starting exercise.   Irritants  If you smoke, quit.   Stay away from smoke. Don't use wood stoves or kerosene heaters. Also stay away from strong perfumes, cleaning products, fresh paint, and other things with strong odors.   Medicines  Some medicines can make asthma symptoms worse. These medicines include aspirin, NSAIDs (nonsteroidal anti-inflammatory drugs), andbeta-blockers.Talk withyour providerabout  your asthma history and medicine use.   Other health problems  Some health problems can make it harder to control asthma. These include:    Respiratory infections such as colds and the flu   GERD (gastroesophageal reflux) and heartburn   Being overweight   Sleep apnea   Depression    Work with your provider to treat any of these problems.   Strong emotions  The strong feelings that go with laughing and crying can trigger asthma symptoms. You can learn how to better manage your emotions.   Key points about asthma   Asthma is a long-term (chronic) lung disease.   Triggers irritate sensitive airways. This makes it hard to breathe.   Staying away from triggers is an important part of treatment.   Long-term medicines control symptoms. They are taken every day, even when you feel well.   Rescue medicines provide quick symptom relief. But they are short-term.    Next steps  Tips to help you get the most from a visit to your healthcare provider:   Know the reason for your visit and what you want to happen.   Before your visit, write down questions you want answered.   Bring someone with you to help you ask questions and remember what your provider tells you.   At the visit, write down the name of a new diagnosis, and any new medicines, treatments, or tests. Also write down any new instructions your provider gives you.   Know why a new medicine or treatment is prescribed, and how it will help you. Also know what the side effects are.   Ask if your condition can be treated in other ways.   Know why a test or procedure is recommended and what the results could mean.   Know what to expect if you do not take the medicine or have the test or procedure.   If you have a follow-up appointment, write down the date, time, and purpose for that visit.   Know how you can contact your provider if you have questions.  [employer] last reviewed this educational content on 05/09/2017   2000-2020 [employer]  [address], [place] [address]. All rights reserved. This information is not intended as a substitute for professional medical care. Always follow your healthcare professional's instructions.

## 2018-12-20 LAB — COVID-19 SCREENING - SEND-OUT: SARS-COV-2 RNA: NOT DETECTED

## 2019-01-10 ENCOUNTER — Other Ambulatory Visit (INDEPENDENT_AMBULATORY_CARE_PROVIDER_SITE_OTHER): Payer: Self-pay | Admitting: Emergency Medicine

## 2019-03-29 ENCOUNTER — Emergency Department (HOSPITAL_COMMUNITY): Payer: Medicare (Managed Care)

## 2019-03-29 ENCOUNTER — Encounter (HOSPITAL_COMMUNITY): Payer: Self-pay

## 2019-03-29 ENCOUNTER — Other Ambulatory Visit: Payer: Self-pay

## 2019-03-29 ENCOUNTER — Emergency Department
Admission: EM | Admit: 2019-03-29 | Discharge: 2019-03-29 | Disposition: A | Discharge status: Home / Self Care | Payer: Medicare (Managed Care) | Attending: Emergency Medicine | Admitting: Emergency Medicine

## 2019-03-29 DIAGNOSIS — G40909 Epilepsy, unspecified, not intractable, without status epilepticus: Secondary | ICD-10-CM | POA: Insufficient documentation

## 2019-03-29 DIAGNOSIS — Z8673 Personal history of transient ischemic attack (TIA), and cerebral infarction without residual deficits: Secondary | ICD-10-CM | POA: Insufficient documentation

## 2019-03-29 DIAGNOSIS — G43909 Migraine, unspecified, not intractable, without status migrainosus: Secondary | ICD-10-CM | POA: Insufficient documentation

## 2019-03-29 MED ORDER — KETAMINE 10 MG/ML INJECTION WRAPPER
INTRAMUSCULAR | Status: AC
Start: 2019-03-29 — End: 2019-03-29
  Administered 2019-03-29: 20:00:00 19.1 mg
  Filled 2019-03-29: qty 3

## 2019-03-29 MED ORDER — SODIUM CHLORIDE 0.9 % IV BOLUS
1000.00 mL | INJECTION | Status: AC
Start: 2019-03-29 — End: 2019-03-29
  Administered 2019-03-29: 21:00:00 0 mL via INTRAVENOUS
  Administered 2019-03-29: 20:00:00 1000 mL via INTRAVENOUS

## 2019-03-29 MED ORDER — ONDANSETRON HCL (PF) 4 MG/2 ML INJECTION SOLUTION
4.00 mg | INTRAMUSCULAR | Status: AC
Start: 2019-03-29 — End: 2019-03-29
  Administered 2019-03-29: 20:00:00 4 mg via INTRAVENOUS
  Filled 2019-03-29: qty 2

## 2019-03-29 MED ORDER — KETAMINE IVPB - FOR ANALGESIA
0.30 mg/kg | INTRAMUSCULAR | Status: AC
Start: 2019-03-29 — End: 2019-03-29
  Administered 2019-03-29: 20:00:00 19.1 mg via INTRAVENOUS

## 2019-03-29 NOTE — ED Nurses Note (Signed)
Patient monitored and 02 2L/NC applied prior to ketamine 19.1mg  in 43ml of NS started at gtt

## 2019-03-29 NOTE — ED Nurses Note (Signed)
Patient on room air

## 2019-03-29 NOTE — ED Nurses Note (Signed)
Patient discharged home with family.  AVS reviewed with patient/care giver.  A written copy of the AVS and discharge instructions was given to the patient/care giver.  Questions sufficiently answered as needed.  Patient/care giver encouraged to follow up with PCP as indicated.  In the event of an emergency, patient/care giver instructed to call 911 or go to the nearest emergency room.

## 2019-03-29 NOTE — ED Provider Notes (Signed)
Department of Emergency [facility]  03/29/2019        Chief Complaint   Patient presents with   . Migraine     Migraine for 3 days, N/V with photophobia, suffers from insomnia        Patient is a 58 y.o.  female presenting to the ED with chief complaint of migraine headache x3 days.  She has had these for many years since she was a child.  No vision changes.  Does have some photophobia.  Some nausea but no vomiting.  She has history of insomnia.  Pain is aching and on the right side of her head exactly like her prior migraines.  No injury or trauma.  No fevers or chills.  No neck pain or stiffness.  No numbness, tingling, or weakness.  Has followed with Neurology at [facility] for many years.  No cough, sore throat, or runny nose.  No other complaints..         Review of Systems:    Constitutional: No fever, chills  Skin: No rashes or lesions  HENT: No sore throat, ear pain, or difficulty swallowing  Eyes: No vision changes, redness, discharge  Cardio: No chest pain, palpitations   Respiratory: No cough, wheezing or SOB  GI:  No nausea or vomiting. No diarrhea or constipation. No abdominal pain  GU:  No dysuria, hematuria, polyuria  MSK: No joint pain.  No neck or back pain  Neuro: No numbness, tingling, or weakness.  No headache  Psych: No SI or HI. Normal mood    All other symptoms reviewed and are negative, unless commented on in the HPI.     Past Medical History:  Past Medical History:   Diagnosis Date   . Angiomyolipoma of kidney    . Asthma    . CVA (cerebrovascular accident) (CMS [place])    . Environmental allergies    . Epilepsy (CMS [place])    . HTN    . Hypothyroid    . IBS (irritable bowel syndrome)    . Insomnia    . Migraines      Past Surgical History:   Procedure Laterality Date   . Hx hand surgery       Above history reviewed with patient.  Allergies, medication list, and old records also reviewed.     Filed Vitals:    03/29/19 1931 03/29/19 2018 03/29/19  2022   BP: (!) 175/85 (!) 160/86 (!) 165/81   Pulse: 86 66 73   Resp: 20 12 18    Temp: 37.4 C (99.3 F)     SpO2: 99% 100% 100%       Physical Exam:     Nursing note and vitals reviewed.  Vital signs reviewed as above. No acute distress.   Constitutional: Pt is well-developed and well-nourished.   Head: Normocephalic and atraumatic.   Eyes: Conjunctivae are normal. Pupils are equal, round, and reactive to light. EOM are intact  Neck: Soft, supple, full range of motion.  No meningismus.  Cardiovascular: RRR.  No Murmurs/rubs/gallops. Distal pulses present and equal bilaterally.  Pulmonary/Chest: Normal BS BL with no distress. No audible wheezes or crackles are noted.  Abdominal: Soft, nontender, nondistended.  No rebound, guarding, or masses.  Musculoskeletal: Normal range of motion. No deformities.  Exhibits no edema and no tenderness.   Neurological: CNs 2-12 grossly intact.  Normal strength and sensation all extremities.  Normal finger-to-nose bilaterally.  No focal deficits noted.  Skin: Warm and dry. No rash or lesions  Psychiatric: Patient has a normal mood and affect.     Workup:     Labs:  No results found for this or any previous visit (from the past 24 hour(s)).    Imaging:         Orders Placed This Encounter   . NS bolus infusion 1,000 mL   . ketamine 19.1 mg IVPB in NS 50 mL - FOR ANALGESIA   . ondansetron (ZOFRAN) 2 mg/mL injection   . ketamine (KETALAR) 10 mg/mL injection - FOR ANALGESIA ---Cabinet Override       Abnormal Lab results:  Labs Reviewed - No data to display      Plan: Appropriate labs and imaging ordered. Medical Records reviewed.    MDM:   During the patient's stay in the emergency department, the above listed imaging and/or labs were performed to assist with medical decision making and were reviewed by myself when available for review.  Patient was not a candidate for any of the normal headache cocktail.  As result analgesic ketamine was ordered which improved the patient's headache.   She was feeling better and wished to go home.  Will discharge.  She should follow up with Dr. go to ER as her neurologist as soon as possible.  Return to ED for any new or worsening symptoms.      Pt remained stable throughout the emergency department course.      Impression: Migraine headache      Genevie Cheshire, MD  03/29/2019, [phone]

## 2019-04-05 ENCOUNTER — Ambulatory Visit (INDEPENDENT_AMBULATORY_CARE_PROVIDER_SITE_OTHER): Payer: Medicare (Managed Care) | Admitting: Obstetrics & Gynecology

## 2019-04-19 ENCOUNTER — Other Ambulatory Visit (INDEPENDENT_AMBULATORY_CARE_PROVIDER_SITE_OTHER): Payer: Self-pay

## 2019-04-19 ENCOUNTER — Encounter (HOSPITAL_BASED_OUTPATIENT_CLINIC_OR_DEPARTMENT_OTHER): Payer: Medicare (Managed Care) | Admitting: Obstetrics & Gynecology

## 2019-04-19 ENCOUNTER — Encounter (INDEPENDENT_AMBULATORY_CARE_PROVIDER_SITE_OTHER): Payer: Self-pay | Admitting: Obstetrics & Gynecology

## 2019-04-19 ENCOUNTER — Ambulatory Visit (INDEPENDENT_AMBULATORY_CARE_PROVIDER_SITE_OTHER): Payer: Medicare (Managed Care) | Admitting: Obstetrics & Gynecology

## 2019-04-19 ENCOUNTER — Encounter (FREE_STANDING_LABORATORY_FACILITY)
Admit: 2019-04-19 | Discharge: 2019-04-19 | Disposition: A | Discharge status: Home / Self Care | Payer: Medicare (Managed Care) | Attending: Obstetrics & Gynecology | Admitting: Obstetrics & Gynecology

## 2019-04-19 ENCOUNTER — Other Ambulatory Visit: Payer: Self-pay

## 2019-04-19 VITALS — BP 122/76 | Ht 66.0 in | Wt 134.5 lb

## 2019-04-19 DIAGNOSIS — R87619 Unspecified abnormal cytological findings in specimens from cervix uteri: Secondary | ICD-10-CM

## 2019-04-19 DIAGNOSIS — N926 Irregular menstruation, unspecified: Secondary | ICD-10-CM

## 2019-04-19 DIAGNOSIS — Z01419 Encounter for gynecological examination (general) (routine) without abnormal findings: Secondary | ICD-10-CM | POA: Insufficient documentation

## 2019-04-19 DIAGNOSIS — Z1231 Encounter for screening mammogram for malignant neoplasm of breast: Secondary | ICD-10-CM

## 2019-04-19 NOTE — Progress Notes (Signed)
[facility]        CC:  Annual Exam    HPI: Teresa Ramirez is a 58 y.o. female here for annual exam. Doing well. C/o some irregular spotting. States that it seems to be when she is unable to get her birth control. She states she sometimes has to change her pad 3 times a day.     Review of Systems -   Constitutional: Negative  Skin: Negative  HEENT: Negative  Cardiovascular: Negative  Respiratory: Negative  Gastrointestinal: Negative  Genitourinary: Negative  Musculoskeletal: Negative  Neurological: Negative  Psychiatric: Negative    Current Outpatient Medications   Medication Sig   . albuterol sulfate (PROAIR HFA) 90 mcg/actuation Inhalation HFA Aerosol Inhaler Take 2 Puffs by inhalation Every 4 hours as needed   . aspirin 81 mg Oral Tablet, Chewable Take 1 Tab (81 mg total) by mouth Once a day   . budesonide-formoteroL (SYMBICORT) 160-4.5 mcg/actuation Inhalation HFA Aerosol Inhaler Take 2 Puffs by inhalation Twice daily   . budesonide/formoterol fumarate (SYMBICORT INHL) Take by inhalation   . ferrous sulfate (IRON) 325 mg (65 mg iron) Oral Tablet take 325 mg by mouth Once a day.     . fremanezumab-vfrm (AJOVY) 225 mg/1.5 mL Subcutaneous Syringe 225 mg by Subcutaneous route Every 30 days   . levothyroxine (SYNTHROID) 25 mcg Oral Tablet Take 75 mcg by mouth Once a day    . NORTREL 7/7/7, 28, 0.5/0.75/1 mg- 35 mcg Oral Tablet TAKE 1 TABLET BY MOUTH EVERY DAY   . pravastatin (PRAVACHOL) 40 mg Oral Tablet Take 1 Tab (40 mg total) by mouth QPM (Patient not taking: Reported on 04/19/2019)   . predniSONE (DELTASONE) 10 mg Oral Tablet Take 4 tabs x 3 days, 3 tabs x 3 days, 2 tabs x 3 days, 1 tab x 3 days (Patient not taking: Reported on 04/19/2019)   . rizatriptan benzoate (MAXALT ORAL) Take by mouth   . topiramate (TOPAMAX) 100 mg Oral Tablet Take 300 mg by mouth Twice daily   . VENTOLIN INHL take by inhalation. As needed      has a past medical history of Angiomyolipoma of  kidney, Asthma, CVA (cerebrovascular accident) (CMS [place]), Environmental allergies, Epilepsy (CMS [place]), HTN, Hypothyroid, IBS (irritable bowel syndrome), Insomnia, and Migraines. She also has no past medical history of Arthropathy, unspecified, site unspecified, Breast disorder, Cancer (CMS [facility]), Congestive heart failure, unspecified, Contact dermatitis and other eczema, due to unspecified cause, COPD (chronic obstructive pulmonary disease) (CMS [place]), Diabetes, Dysplasia of cervix, Ectopic pregnancy, Female infertility, Heart disease, breast cancer, Liver disease, Osteoporosis, Other forms of chronic ischemic heart disease, Postpartum depression, Psychiatric problem, Rectal cancer (CMS [place]), Reflux, STD (sexually transmitted disease), Vaginal infection, Viral hepatitis, Wears dentures, or Wears glasses.  Past Surgical History:   Procedure Laterality Date   . HX HAND SURGERY           Family Medical History:     Problem Relation (Age of Onset)    Elevated Lipids Mother    Heart Attack Maternal Grandfather (70), Maternal Grandmother (70)    Hypertension (High Blood Pressure) Maternal Grandmother, Mother            Social History     Socioeconomic History   . Marital status: Single     Spouse name: Not on file   . Number of children: Not on file   . Years  of education: Not on file   . Highest education level: Not on file   Tobacco Use   . Smoking status: Current Some Day Smoker     Packs/day: 0.10     Years: 3.00     Pack years: 0.30     Types: Cigarettes   . Smokeless tobacco: Never Used   . Tobacco comment: "maybe about a pack a month, on and off for 3 years"   Substance and Sexual Activity   . Alcohol use: No   . Drug use: No     Comment: past use of marijuana in college   . Sexual activity: Yes     Partners: Male     Birth control/protection: Pill     Comment: has a boyfriend and is monogamous with him.      Expanded Substance History     Additional history       Objective:  Vitals:    04/19/19 0853   BP: 122/76      Weight: 61 kg (134 lb 8 oz)   Height: 1.676 m (5\' 6" )   BMI: 21.75           Physical Exam    Gen NAD BP 122/76   Ht 1.676 m (5\' 6" )   Wt 61 kg (134 lb 8 oz)   BMI 21.71 kg/m           HEENT Normocephalic    Cardiovascular RRR    Respiratory CTA    Breasts: normal without suspicious masses, skin or nipple changes or axillary nodes, self-exam is taught and encouraged.    Abdomen soft, nt    Pelvic exam: exam chaperoned by nurse, normal vagina and vulva.    Extremities no c/c/e    Neurological intact    Psychiatric in a good mood    Assessment    58 y.o. annual exam      Plan      ICD-10-CM    1. Screening mammogram, encounter for  Z12.31 MAMMO BILATERAL SCREENING-ADDL VIEWS/BREAST US AS REQ BY RAD   2. Irregular bleeding  N92.6 OBG US 76830 Transvaginal Gyn   3. Well woman exam with routine gynecological exam  Z01.419 CYTOPATHOLOGY, GYN +/- HIGH RISK HPV     Discussed that most likely spotting is common from the endometrium being too thin. Advised will discuss options after ultrasound.     Return in about 1 year (around 04/18/2020) for Annual.      Deloris Ping, MD 04/19/2019, 09:27

## 2019-05-10 ENCOUNTER — Ambulatory Visit (INDEPENDENT_AMBULATORY_CARE_PROVIDER_SITE_OTHER): Payer: Medicare (Managed Care) | Admitting: Mammography

## 2019-05-10 ENCOUNTER — Other Ambulatory Visit (INDEPENDENT_AMBULATORY_CARE_PROVIDER_SITE_OTHER): Payer: Medicare (Managed Care)

## 2019-05-11 LAB — CYTOPATHOLOGY, GYN +/- HIGH RISK HPV

## 2019-05-15 LAB — HUMAN PAPILLOMA VIRUS (HPV) BY PCR WITH HIGH RISK GENOTYPING (THINPREP)
HPV OTHER: NEGATIVE
HPV16 PCR: NEGATIVE
HPV18 PCR: NEGATIVE

## 2019-05-16 ENCOUNTER — Encounter (INDEPENDENT_AMBULATORY_CARE_PROVIDER_SITE_OTHER): Payer: Self-pay | Admitting: Obstetrics & Gynecology

## 2019-05-21 ENCOUNTER — Ambulatory Visit: Payer: Self-pay | Attending: Plastic Surgery | Admitting: Plastic Surgery

## 2019-05-21 ENCOUNTER — Other Ambulatory Visit: Payer: Self-pay

## 2019-05-21 ENCOUNTER — Encounter (HOSPITAL_BASED_OUTPATIENT_CLINIC_OR_DEPARTMENT_OTHER): Payer: Self-pay | Admitting: Plastic Surgery

## 2019-05-21 VITALS — BP 149/72 | HR 86 | Temp 97.0°F | Ht 65.98 in

## 2019-05-21 DIAGNOSIS — Z87891 Personal history of nicotine dependence: Secondary | ICD-10-CM

## 2019-05-21 DIAGNOSIS — H02835 Dermatochalasis of left lower eyelid: Secondary | ICD-10-CM

## 2019-05-21 DIAGNOSIS — H02834 Dermatochalasis of left upper eyelid: Secondary | ICD-10-CM

## 2019-05-21 DIAGNOSIS — H02832 Dermatochalasis of right lower eyelid: Secondary | ICD-10-CM

## 2019-05-21 DIAGNOSIS — Z411 Encounter for cosmetic surgery: Secondary | ICD-10-CM

## 2019-05-21 DIAGNOSIS — H02831 Dermatochalasis of right upper eyelid: Secondary | ICD-10-CM

## 2019-05-21 NOTE — Patient Instructions (Signed)
Eyelid Surgery (Blepharoplasty)    Eyelid surgery (blepharoplasty) is a type of cosmetic surgery. It is most often done to improve the look of the eyelids by removing excess skin and fat. Loose lids can be tightened. Both the upper and lower eyelids can be treated during the surgery. Discuss your treatment goals with your healthcare provider. He or she can tell you more about what to expect.  Preparing for surgery  Prepare for the surgery as you have been told. In addition:   Tell your healthcare provider about any recent health conditions and all medicines you take. This includes herbs and other supplements. It also includes any blood thinners, such as warfarin, clopidogrel, or daily aspirin. You may need to stop taking some or all of them before surgery.   Inform your healthcare provider about any eye conditions you have such as dry eye.   Follow any directions you are given for not eating or drinking before surgery.  The day of the surgery  The surgery takes about 1 to 2 hours. You will likely go home the same day.  Before the surgery begins   You'll remove any makeup from your eyes. You'll also remove contact lenses if you wear them.   An IV line is put into a vein in your arm or hand. This line supplies fluids and medicines.   You'll be given medicine to keep you free of pain during the surgery. You may have general anesthesia, which puts you into a state like deep sleep during the surgery. (A tube may be inserted into your throat to help you breathe.) Or you may have sedation, which makes you relaxed and sleepy. With sedation, local anesthesia will be injected to numb the areas being worked on. The anesthesiologist will discuss your options with you.  During the surgery   For the upper eyelids, an incision is made along the eyelid crease.   For the lower eyelids, an incision is made inside the lower eyelid. Or it is made in the skin just under the lower lash line.   With either the upper or lower  eyelids, fat may be shaped or removed. Skin may be removed. If muscles are loose, stretched, or torn, they may be tightened or repaired.   Incisions are closed with stitches (sutures). In some cases, surgical glue is used.    After the surgery  You're taken to a recovery room to wake up from the anesthesia. You may feel sleepy and nauseated. If a breathing tube was used, your throat may be sore at first. If needed, you'll be given pain medicine to relieve any discomfort. You'll sit semi-inclined or with your head propped on pillows, and may have cold packs on your eyes. These measures help reduce bruising and swelling. When you are ready to leave the hospital, an adult family member or friend must drive you.  Recovering at home  Once at home, follow all instructions you are given. Your healthcare provider will tell you when you can return to your normal routine. You may have some bruising and swelling around your eyes and your vision may be blurry. This is normal and should improve within a week or two. And you may notice your eyes burning or feeling strained during certain activities, such as watching TV, reading a book, or using the computer. While this persists, avoid doing such activities for too long at a time. Be sure to:   Take all medicines exactly as directed. These may include applying eye  ointment or using eye drops.   Apply an ice pack or cold compress to the eyes as directed, for the first 12 to 24hours after surgery.   Care for your incisions as instructed.   Wear protective sunglasses as directed.   Wear eye makeup and contact lenses only as directed.   Don't swim or put yourhead under water.   Don't lift anything heavy or dostrenuous activities. Doing so can cause bleeding.   Don't drive until your healthcare provider says it's OK. Don't drive while taking medicines that make you drowsy or sleepy.  When to call your healthcare provider  Call your healthcare provider right away if you have  any of the following:   Chest pain or trouble breathing (call 911 or other emergency service)   Fever of 100.4F (38C) or higher, or as directed by your healthcare provider   Increased redness, tearing, or itching of the eyes   Changes in vision, such as double vision, blurry vision, or loss of light perception   Symptoms of infection at an incision site, such as increased redness or swelling, warmth, worsening pain, or foul-smelling drainage   Discomfort or swelling that's worse in one eye than the other   Pain that cannot be managed with medicines   Other signs or symptoms as indicated by your healthcare provider  Follow-up  You'll have follow-up visits with your healthcare provider. During these visits, your healthcare provider will check the results of your surgery and how well you're healing. If stitches need to be removed, this is done in about 5 to 7 days.  Risks and complications  Risks and possible complications include:   Bleeding   Infection   Problems with vision, such as blurry or double vision and trouble closing the eyes   Dry or teary eyes   Changes in sensation, such as numbness or pain   Skin discoloration   Damage or injury to the eyes   Vision problems (including blindness)   Displacement of the lower eyelid margin (ectropia), which can be temporary or permanent   Not happy with cosmetic results   Risks of anesthesia. The anesthesiologist will discuss these with you.  [employer] last reviewed this educational content on 05/09/2016   2000-2020 [employer] [address], [place] [address]. All rights reserved. This information is not intended as a substitute for professional medical care. Always follow your healthcare professional's instructions.

## 2019-05-21 NOTE — H&P (Signed)
PLASTIC SURGERY, PHYSICIANS OFFICE [facility]  [place] [address]  Phone: [phone]  Fax: [phone]    Encounter Date: 05/21/2019    Patient ID:  Matilde Petruso  [id number]    DOB: 1961-03-24  Age: 58 y.o. female    Subjective:     Chief Complaint   Patient presents with   . Bags Under Eyes     HPI Ivanell Marcinko is a 58 y.o. female presents to [facility] for evaluation and treatment of "bags under eyes". Patient reports she has bilateral upper eyelid skin that she reports is worse on the left than the right. She is also concerned with the "bags" under her eyes. Patient would like to discuss blepharoplasty. 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 She has a history of asthma and hypothyroidism. She is a previous smoker having quit in 2004. Patient reports having a previous abdominoplasty in 75 in [place].  She also points out an area of concern in the central forehead just above the nasion.    Current Outpatient Medications   Medication Sig   . albuterol sulfate (PROAIR HFA) 90 mcg/actuation Inhalation HFA Aerosol Inhaler Take 2 Puffs by inhalation Every 4 hours as needed   . aspirin 81 mg Oral Tablet, Chewable Take 1 Tab (81 mg total) by mouth Once a day   . budesonide-formoteroL (SYMBICORT) 160-4.5 mcg/actuation Inhalation HFA Aerosol Inhaler Take 2 Puffs by inhalation Twice daily   . budesonide/formoterol fumarate (SYMBICORT INHL) Take by inhalation   . clonazePAM (KLONOPIN) 0.5 mg Oral Tablet TAKE 1 TABLET BY MOUTH EVERY DAY   . EMGALITY PEN 120 mg/mL Subcutaneous Pen Injector INJECT 1 PEN VIA SUBCUTANEOUS ROUTE EVERY 30 DAYS   . ferrous sulfate (IRON) 325 mg (65 mg iron) Oral Tablet take 325 mg by mouth Once a day.     . levothyroxine (SYNTHROID) 75 mcg Oral Tablet    . NORTREL 7/7/7, 28, 0.5/0.75/1 mg- 35 mcg Oral Tablet TAKE 1  TABLET BY MOUTH EVERY DAY   . ondansetron (ZOFRAN) 4 mg Oral Tablet TAKE 1 TABLET BY MOUTH EVERY DAY   . PREVIDENT 5000 BOOSTER PLUS 1.1 % Dental Paste    . rosuvastatin (CRESTOR) 5 mg Oral Tablet TAKE 1 TABLET BY MOUTH EVERY DAY   . topiramate (TOPAMAX) 100 mg Oral Tablet Take 300 mg by mouth Twice daily   . venlafaxine (EFFEXOR XR) 75 mg Oral Capsule, Sust. Release 24 hr TAKE 1 CAPSULE BY MOUTH EVERY DAY   . VENTOLIN INHL take by inhalation. As needed   . zolpidem (AMBIEN) 10 mg Oral Tablet TAKE 1 TABLET BY MOUTH AT BEDTIME AS NEEDED   . ZYRTEC-D 5-120 mg Oral Tablet Sustained Release 12 hr TAKE 1 TABLET BY MOUTH TWICE A DAY     Allergies   Allergen Reactions   . Compazine [Prochlorperazine Edisylate] Seizure   . Haldol [Haloperidol]    . Periactin [Cyproheptadine] Anaphylaxis   . Phenergan [Promethazine] Seizure   . Reglan [Metoclopramide] Seizure     Past Medical History:   Diagnosis Date   . Angiomyolipoma of kidney    . Asthma    . CVA (cerebrovascular accident) (CMS [place])    . Environmental allergies    . Epilepsy (CMS [place])    . HTN    . Hypothyroid    . IBS (irritable bowel syndrome)    .  Insomnia    . Migraines      Past Surgical History:   Procedure Laterality Date   . HX HAND SURGERY       Family Medical History:     Problem Relation (Age of Onset)    Elevated Lipids Mother    Heart Attack Maternal Grandfather (35), Maternal Grandmother (70)    Hypertension (High Blood Pressure) Maternal Grandmother, Mother        Social History     Tobacco Use   . Smoking status: Current Some Day Smoker     Packs/day: 0.10     Years: 3.00     Pack years: 0.30     Types: Cigarettes   . Smokeless tobacco: Never Used   . Tobacco comment: "maybe about a pack a month, on and off for 3 years"   Substance Use Topics   . Alcohol use: No   . Drug use: No     Comment: past use of marijuana in college     Review of Systems   Constitutional: Negative.    HENT: Negative.    Eyes: Negative.         Excess skin of eyelids   Respiratory:  Negative.    Cardiovascular: Negative.    Gastrointestinal: Negative.    Endocrine: Negative.    Genitourinary: Negative.    Musculoskeletal: Negative.    Skin: Negative.    Allergic/Immunologic: Negative.    Neurological: Negative.         History of previous transient neurologic episode involving left-sided weakness resolved after tPA with negative MRI and echocardiogram   Hematological: Negative.    Psychiatric/Behavioral: Negative.      Objective:   Vitals: BP (!) 149/72 (Site: Left, Patient Position: Sitting, Cuff Size: Adult)   Pulse 86   Temp 36.1 C (97 F) (Thermal Scan)   Ht 1.676 m (5' 5.98")   SpO2 99%   BMI 21.72 kg/m     Physical Exam  Constitutional:       General: She is not in acute distress.     Appearance: Normal appearance. She is normal weight. She is not ill-appearing, toxic-appearing or diaphoretic.   HENT:      Head: Normocephalic and atraumatic.      Nose: Nose normal.   Eyes:      General: Lids are normal. Vision grossly intact. No visual field deficit or scleral icterus.        Right eye: No discharge.         Left eye: No discharge.      Extraocular Movements: Extraocular movements intact.      Right eye: Normal extraocular motion and no nystagmus.      Left eye: Normal extraocular motion and no nystagmus.      Conjunctiva/sclera: Conjunctivae normal.        Comments: Snap test is good. Lid position is normal. No conjunctivitis. She has moderate upper lid dermatochalasis. She does not have significant upper lid peri orbital fat protrusion only slightly medially.  She has some peri orbital fat protrusion of the lower lids.   Neck:      Musculoskeletal: Normal range of motion. No neck rigidity.   Pulmonary:      Effort: Pulmonary effort is normal.      Breath sounds: No stridor.   Musculoskeletal: Normal range of motion.      Comments:  She has an irregularity of her right 5th metacarpal from previous fracture pinning.   Skin:  General: Skin is warm.      Coloration: Skin is not  jaundiced.      Findings: No bruising or rash.   Neurological:      General: No focal deficit present.      Mental Status: She is alert and oriented to person, place, and time.      Cranial Nerves: No cranial nerve deficit.      Sensory: No sensory deficit.   Psychiatric:         Mood and Affect: Mood normal.         Behavior: Behavior normal.         Thought Content: Thought content normal.         Judgment: Judgment normal.     Clinical photos obtained.   Assessment & Plan:     ENCOUNTER DIAGNOSES     ICD-10-CM   1. Encounter for cosmetic surgery  Z41.1   2. Dermatochalasis of upper and lower eyelids of both eyes  H02.831    H02.834    H02.832    H02.835   3. History of tobacco use  Z87.891       A lengthy discussion was held regarding the option for facial aesthetic improvement with upper lid and lower lid blepharoplasty  for improvement upper and lower lid dermatochalasis periorbital fat protrusion. The operative procedure, perioperative care and postoperative disability was discussed at length and understood. The specific risks including bleeding, scars, infection, possible nerve injury as well as the potential for ectropion entropion asymmetry or unacceptable cosmetic result and possible need for secondary procedures was discussed and understood. The placement of the incisions in the upper lid tarsal crease extending laterally and sub ciliary incision was reviewed.  The pros and cons of a transconjunctival approach were discussed.  All of the patients questions were answered and was advised that I would be happy to see her back for a second visit at no additional charge should she desire further consultation or treatment.   The patient requests a cost estimate for the procedures discussed.      A lengthy discussion was held with the patient regarding the options for facial improvement including traditional surgical methods, injectable fillers such as Restylane etc. The importance of avoiding significant sun  exposure, cigarette smoke, and eating a healthy diet was reviewed. The relative risks of the various modes of treatment were discussed in detail. She was advised that hyaluronic acid fillers have been shown to be safe and effective but not permanent solutions. Specific risks such as blindness when injected in the peri-orbital area was discussed as a potential but unlikely risk. Common risks associated with injectable fillers such as bleeding, cold sore exacerbations, lumpiness and the potential for an unsatisfactory cosmetic result was discussed and understood. She was advised that I have considerable experience with hyaluronic acid fillers and typically perform these procedures in the office under local anesthesia with EMLA premedication after the patient has stopped all anticoagulant such as aspirin and have limited the use of over-the-counter naturopathic herbs and treatments for several weeks to avoid unnecessary bruising.    She was advised that if she is interested in bilateral upper lower lid blepharoplasty I would personally recommend performing the lower lid blepharoplasty as that appears to be her primary concern.  The operative procedure of a skin skin muscle flap technique with anchoring the flap laterally to the orbital rim to avoid ectropion was discussed.    Orders Placed This Encounter   . RESTYLYNE INJECTION (  AMB ONLY)     Return for second followup discussion prior to scheduling.    Marita Snellen, LPN  I am scribing for, and in the presence of, Dr. Lupita Raider for services provided on 05/21/2019.  I have reviewed and confirmed the ROS, PFSH, and all other elements documented by the scribe.  The scribed portion of the progress note was scribed on my behalf and at my direction. I have reviewed this and attest to the accuracy of the note.    Clarice Pole MD

## 2019-05-28 ENCOUNTER — Encounter (HOSPITAL_BASED_OUTPATIENT_CLINIC_OR_DEPARTMENT_OTHER): Payer: Self-pay | Admitting: Plastic Surgery

## 2019-05-28 ENCOUNTER — Ambulatory Visit: Payer: Medicare (Managed Care) | Attending: Plastic Surgery | Admitting: Plastic Surgery

## 2019-05-28 ENCOUNTER — Other Ambulatory Visit: Payer: Self-pay

## 2019-05-28 NOTE — Patient Instructions (Signed)
Eyelid Surgery (Blepharoplasty)    Eyelid surgery (blepharoplasty) is a type of cosmetic surgery. It is most often done to improve the look of the eyelids by removing excess skin and fat. Loose lids can be tightened. Both the upper and lower eyelids can be treated during the surgery. Discuss your treatment goals with your healthcare provider. He or she can tell you more about what to expect.  Preparing for surgery  Prepare for the surgery as you have been told. In addition:   Tell your healthcare provider about any recent health conditions and all medicines you take. This includes herbs and other supplements. It also includes any blood thinners, such as warfarin, clopidogrel, or daily aspirin. You may need to stop taking some or all of them before surgery.   Inform your healthcare provider about any eye conditions you have such as dry eye.   Follow any directions you are given for not eating or drinking before surgery.  The day of the surgery  The surgery takes about 1 to 2 hours. You will likely go home the same day.  Before the surgery begins   You'll remove any makeup from your eyes. You'll also remove contact lenses if you wear them.   An IV line is put into a vein in your arm or hand. This line supplies fluids and medicines.   You'll be given medicine to keep you free of pain during the surgery. You may have general anesthesia, which puts you into a state like deep sleep during the surgery. (A tube may be inserted into your throat to help you breathe.) Or you may have sedation, which makes you relaxed and sleepy. With sedation, local anesthesia will be injected to numb the areas being worked on. The anesthesiologist will discuss your options with you.  During the surgery   For the upper eyelids, an incision is made along the eyelid crease.   For the lower eyelids, an incision is made inside the lower eyelid. Or it is made in the skin just under the lower lash line.   With either the upper or lower  eyelids, fat may be shaped or removed. Skin may be removed. If muscles are loose, stretched, or torn, they may be tightened or repaired.   Incisions are closed with stitches (sutures). In some cases, surgical glue is used.    After the surgery  You're taken to a recovery room to wake up from the anesthesia. You may feel sleepy and nauseated. If a breathing tube was used, your throat may be sore at first. If needed, you'll be given pain medicine to relieve any discomfort. You'll sit semi-inclined or with your head propped on pillows, and may have cold packs on your eyes. These measures help reduce bruising and swelling. When you are ready to leave the hospital, an adult family member or friend must drive you.  Recovering at home  Once at home, follow all instructions you are given. Your healthcare provider will tell you when you can return to your normal routine. You may have some bruising and swelling around your eyes and your vision may be blurry. This is normal and should improve within a week or two. And you may notice your eyes burning or feeling strained during certain activities, such as watching TV, reading a book, or using the computer. While this persists, avoid doing such activities for too long at a time. Be sure to:   Take all medicines exactly as directed. These may include applying eye  ointment or using eye drops.   Apply an ice pack or cold compress to the eyes as directed, for the first 12 to 24hours after surgery.   Care for your incisions as instructed.   Wear protective sunglasses as directed.   Wear eye makeup and contact lenses only as directed.   Don't swim or put yourhead under water.   Don't lift anything heavy or dostrenuous activities. Doing so can cause bleeding.   Don't drive until your healthcare provider says it's OK. Don't drive while taking medicines that make you drowsy or sleepy.  When to call your healthcare provider  Call your healthcare provider right away if you have  any of the following:   Chest pain or trouble breathing (call 911 or other emergency service)   Fever of 100.4F (38C) or higher, or as directed by your healthcare provider   Increased redness, tearing, or itching of the eyes   Changes in vision, such as double vision, blurry vision, or loss of light perception   Symptoms of infection at an incision site, such as increased redness or swelling, warmth, worsening pain, or foul-smelling drainage   Discomfort or swelling that's worse in one eye than the other   Pain that cannot be managed with medicines   Other signs or symptoms as indicated by your healthcare provider  Follow-up  You'll have follow-up visits with your healthcare provider. During these visits, your healthcare provider will check the results of your surgery and how well you're healing. If stitches need to be removed, this is done in about 5 to 7 days.  Risks and complications  Risks and possible complications include:   Bleeding   Infection   Problems with vision, such as blurry or double vision and trouble closing the eyes   Dry or teary eyes   Changes in sensation, such as numbness or pain   Skin discoloration   Damage or injury to the eyes   Vision problems (including blindness)   Displacement of the lower eyelid margin (ectropia), which can be temporary or permanent   Not happy with cosmetic results   Risks of anesthesia. The anesthesiologist will discuss these with you.  [employer] last reviewed this educational content on 05/09/2016   2000-2020 [employer] [address], [place] [address]. All rights reserved. This information is not intended as a substitute for professional medical care. Always follow your healthcare professional's instructions.

## 2019-05-28 NOTE — H&P (Cosign Needed)
PLASTIC SURGERY, PHYSICIANS OFFICE [facility]  [place] [address]  Phone: [phone]  Fax: [phone]    Encounter Date: 05/28/2019    Patient ID:  Teresa Ramirez  [id number]    DOB: [birth year]  Age: 58 y.o. female    Subjective:     Chief Complaint   Patient presents with   . Blepharoplasty     HPI Teresa Ramirez is a 58 y.o. female presents to [facility] returns today for additional blepharoplasty consultation. Patient was previously evaluated on May 21, 2019 at which time we discussed upper and lower lid blepharoplasty. Patient reports she has bilateral upper eyelid skin that she reports is worse on the left than the right. She is also concerned with the "bags" under her eyes. 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 She has a history of asthma and hypothyroidism. She is a previous smoker having quit in 2004. Patient reports having a previous abdominoplasty in 69 in [place].  She also points out an area of concern in the central forehead just above the nasion  Current Outpatient Medications   Medication Sig   . albuterol sulfate (PROAIR HFA) 90 mcg/actuation Inhalation HFA Aerosol Inhaler Take 2 Puffs by inhalation Every 4 hours as needed   . aspirin 81 mg Oral Tablet, Chewable Take 1 Tab (81 mg total) by mouth Once a day   . budesonide-formoteroL (SYMBICORT) 160-4.5 mcg/actuation Inhalation HFA Aerosol Inhaler Take 2 Puffs by inhalation Twice daily   . clonazePAM (KLONOPIN) 0.5 mg Oral Tablet TAKE 1 TABLET BY MOUTH EVERY DAY   . EMGALITY PEN 120 mg/mL Subcutaneous Pen Injector INJECT 1 PEN VIA SUBCUTANEOUS ROUTE EVERY 30 DAYS   . ferrous sulfate (IRON) 325 mg (65 mg iron) Oral Tablet take 325 mg by mouth Once a day.     . levothyroxine (SYNTHROID) 75 mcg Oral Tablet    . NORTREL 7/7/7, 28, 0.5/0.75/1 mg- 35 mcg Oral Tablet TAKE 1  TABLET BY MOUTH EVERY DAY   . ondansetron (ZOFRAN) 4 mg Oral Tablet TAKE 1 TABLET BY MOUTH EVERY DAY   . PREVIDENT 5000 BOOSTER PLUS 1.1 % Dental Paste    . rosuvastatin (CRESTOR) 5 mg Oral Tablet TAKE 1 TABLET BY MOUTH EVERY DAY   . topiramate (TOPAMAX) 100 mg Oral Tablet Take 300 mg by mouth Twice daily   . venlafaxine (EFFEXOR XR) 75 mg Oral Capsule, Sust. Release 24 hr TAKE 1 CAPSULE BY MOUTH EVERY DAY   . VENTOLIN INHL take by inhalation. As needed   . zolpidem (AMBIEN) 10 mg Oral Tablet TAKE 1 TABLET BY MOUTH AT BEDTIME AS NEEDED   . ZYRTEC-D 5-120 mg Oral Tablet Sustained Release 12 hr TAKE 1 TABLET BY MOUTH TWICE A DAY     Allergies   Allergen Reactions   . Compazine [Prochlorperazine Edisylate] Seizure   . Haldol [Haloperidol]    . Periactin [Cyproheptadine] Anaphylaxis   . Phenergan [Promethazine] Seizure   . Reglan [Metoclopramide] Seizure     Past Medical History:   Diagnosis Date   . Angiomyolipoma of kidney    . Asthma    . CVA (cerebrovascular accident) (CMS [place])    . Environmental allergies    . Epilepsy (CMS [facility] [place])    . HTN    . Hypothyroid    . IBS (irritable bowel syndrome)    . Insomnia    .  Migraines      Past Surgical History:   Procedure Laterality Date   . HX HAND SURGERY       Family Medical History:     Problem Relation (Age of Onset)    Elevated Lipids Mother    Heart Attack Maternal Grandfather (42), Maternal Grandmother (70)    Hypertension (High Blood Pressure) Maternal Grandmother, Mother        Social History     Tobacco Use   . Smoking status: Current Some Day Smoker     Packs/day: 0.10     Years: 3.00     Pack years: 0.30     Types: Cigarettes   . Smokeless tobacco: Never Used   . Tobacco comment: "maybe about a pack a month, on and off for 3 years"   Substance Use Topics   . Alcohol use: No   . Drug use: No     Comment: past use of marijuana in college     Review of Systems  Objective:   Vitals: Pulse 74   Temp 36.7 C (98.1 F) (Thermal Scan)   Ht 1.676 m (5' 5.98")   SpO2 100%    BMI 21.72 kg/m     Physical Exam  Snap test is good. Lid position is normal. No conjunctivitis. She has moderate upper lid dermatochalasis. She does not have significant upper lid peri orbital fat protrusion only slightly medially.  She has some peri orbital fat protrusion of the lower lids.   Assessment & Plan:   No diagnosis found.    No orders of the defined types were placed in this encounter.    A lengthy discussion was held regarding the option for facial aesthetic improvement with upper lid and lower lid blepharoplasty  for improvement upper and lower lid dermatochalasis periorbital fat protrusion. The operative procedure, perioperative care and postoperative disability was discussed at length and understood. The specific risks including bleeding, scars, infection, possible nerve injury as well as the potential for ectropion entropion asymmetry or unacceptable cosmetic result and possible need for secondary procedures was discussed and understood. The placement of the incisions in the upper lid tarsal crease extending laterally and sub ciliary incision was reviewed.  The pros and cons of a transconjunctival approach were discussed.  All of the patients questions were answered and was advised that I would be happy to see her back for a second visit at no additional charge should she desire further consultation or treatment.      She was advised that if she is interested in bilateral upper lower lid blepharoplasty I would personally recommend performing the lower lid blepharoplasty as that appears to be her primary concern.  The operative procedure of a skin skin muscle flap technique with anchoring the flap laterally to the orbital rim to avoid ectropion was discussed.    No follow-ups on file.    Teresa Snellen, LPN  I am scribing for, and in the presence of, Dr. Lupita Raider for services provided on 05/28/2019.

## 2019-05-31 ENCOUNTER — Other Ambulatory Visit (HOSPITAL_BASED_OUTPATIENT_CLINIC_OR_DEPARTMENT_OTHER): Payer: Self-pay

## 2019-05-31 DIAGNOSIS — H02832 Dermatochalasis of right lower eyelid: Secondary | ICD-10-CM

## 2019-06-11 ENCOUNTER — Ambulatory Visit (INDEPENDENT_AMBULATORY_CARE_PROVIDER_SITE_OTHER): Payer: Medicare (Managed Care) | Admitting: Mammography

## 2019-06-18 ENCOUNTER — Encounter (HOSPITAL_BASED_OUTPATIENT_CLINIC_OR_DEPARTMENT_OTHER): Payer: Self-pay | Admitting: Plastic Surgery

## 2019-06-18 ENCOUNTER — Ambulatory Visit: Payer: Medicare (Managed Care) | Attending: Plastic Surgery | Admitting: Plastic Surgery

## 2019-06-18 ENCOUNTER — Other Ambulatory Visit: Payer: Self-pay

## 2019-06-18 VITALS — HR 76 | Resp 16 | Ht 66.0 in | Wt 142.0 lb

## 2019-06-18 DIAGNOSIS — H02831 Dermatochalasis of right upper eyelid: Secondary | ICD-10-CM

## 2019-06-18 DIAGNOSIS — H02835 Dermatochalasis of left lower eyelid: Secondary | ICD-10-CM

## 2019-06-18 DIAGNOSIS — H02834 Dermatochalasis of left upper eyelid: Secondary | ICD-10-CM

## 2019-06-18 DIAGNOSIS — F17200 Nicotine dependence, unspecified, uncomplicated: Secondary | ICD-10-CM

## 2019-06-18 DIAGNOSIS — H02832 Dermatochalasis of right lower eyelid: Secondary | ICD-10-CM

## 2019-06-18 MED ORDER — ERYTHROMYCIN 5 MG/GRAM (0.5 %) EYE OINTMENT
TOPICAL_OINTMENT | OPHTHALMIC | 1 refills | Status: DC
Start: 2019-06-18 — End: 2021-05-28

## 2019-06-18 NOTE — Patient Instructions (Signed)
[facility]  [id number]  [place] [address]  Dept: [phone]  Dept Fax: [phone]  Cell: [phone]  INSTRUCTIONS AFTER EYELID SURGERY  . No heavy physical activity or contact sports.  Rest is best!  . Avoid bending over or heavy lifting!  . Your surgical site may be sore, but you should not experience severe pain.  . Cold compresses or ice packs in the first 24-48 hours may help pain and swelling.  . Elevation of the surgical site helps minimize swelling and discomfort.   . Consider sleeping in a recliner for the first several days until the swelling and bruising has improved.  . If Tylenol or acetaminophen OTC is not effective may use ibuprofen OTC (Advil) or naproxen (Aleve)  . Some bleeding or oozing is normal and is expected in the first 3-4 days.  . You may shower anytime with mild plain soap (Dove/Ivory) and water.  . You may wash your hair with mild shampoo (Johnson's Baby)  . Leave any Steri-strips (thin paper strips) intact.  . Avoid peroxide or astringents or alcohol.  . Just pat the area dry and apply Bacitracin or Erythromycin ophthalmic ointment with cotton tip applicator (Q-tip) to suture line.  . Some blood may become dried under the paper strips and is of cosmetic concern only.Bleeding that continues after head elevation should stop with gentle direct pressure with cold compresses held for 10 minutes.  . Avoid immersing your wound in water (swimming and tub baths) until seen in the office.  . If the Steri-strips come off continue to apply Bacitracin or Erythromycin ophthalmic ointment to the area until seen in the office. There is no need for additional dressing except the ointment.  Contact your Surgeon if the wound becomes:    Marland Kitchen Very Red, Swollen, Severe Pain, or Drains Pus  . Bleeding that does not stop with gentle direct pressure  . Persistent feeling of something in or irritating the eye   Your next office follow-up appointment to be  seen in 5-10 days is scheduled for July 03, 2019 at 1:30 pm.  Any pathology reports will be reviewed at this visit.  Mobile: [phone] Office:  [facility] Paging:  [phone]    You are scheduled for outpatient surgery on June 28, 2019. You should present to the 2nd floor of [facility] at 7:00 am. Nothing to eat or drink after midnight the night before.

## 2019-06-18 NOTE — H&P (Signed)
PLASTIC SURGERY, PHYSICIANS OFFICE [facility]  [place] [address]  Phone: [phone]  Fax: [phone]    Encounter Date: 06/18/2019    Patient ID:  Teresa Ramirez  [id number]    DOB: Jun 11, 1961  Age: 58 y.o. female    Subjective:     Chief Complaint   Patient presents with   . Eyelid     lower lid bleph     HPI Teresa Ramirez is a 58 y.o. female presents to [facility] for continued blepharoplasty discussion. Patient was previously evaluated on May 28, 2019 at which time we discussed upper and lower lid blepharoplasty. Patient reports she has bilateral upper eyelid skin that she reports is worse on the left than the right. She is also concerned with the "bags" under her eyes. 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 She has a history of asthma and hypothyroidism. She is a previous smoker having quit in 2004.Patient reports having a previous abdominoplasty in 89 in [place].She also points out an area of concern in the central forehead just above the nasion. Patient would like to proceed with bilateral lower lid blepharoplasty first then bilateral upper lid blepharoplasty after the holidays. She reports her health has been unchanged since her previous visit with me.   Current Outpatient Medications   Medication Sig   . albuterol sulfate (PROAIR HFA) 90 mcg/actuation Inhalation HFA Aerosol Inhaler Take 2 Puffs by inhalation Every 4 hours as needed   . aspirin 81 mg Oral Tablet, Chewable Take 1 Tab (81 mg total) by mouth Once a day   . budesonide-formoteroL (SYMBICORT) 160-4.5 mcg/actuation Inhalation HFA Aerosol Inhaler Take 2 Puffs by inhalation Twice daily   . clonazePAM (KLONOPIN) 0.5 mg Oral Tablet TAKE 1 TABLET BY MOUTH EVERY DAY   . EMGALITY PEN 120 mg/mL Subcutaneous Pen Injector INJECT 1 PEN VIA SUBCUTANEOUS ROUTE EVERY 30 DAYS     . erythromycin (ROMYCIN) 5 mg/gram (0.5 %) Ophthalmic Ointment Apply to suture line 2 or 3 times daily with cotton tip applicator   . Eszopiclone 3 mg Oral Tablet TAKE 1 TABLET BY MOUTH EVERY DAY AT BEDTIME   . ferrous sulfate (IRON) 325 mg (65 mg iron) Oral Tablet take 325 mg by mouth Once a day.     . levothyroxine (SYNTHROID) 75 mcg Oral Tablet    . NORTREL 7/7/7, 28, 0.5/0.75/1 mg- 35 mcg Oral Tablet TAKE 1 TABLET BY MOUTH EVERY DAY   . ondansetron (ZOFRAN) 4 mg Oral Tablet TAKE 1 TABLET BY MOUTH EVERY DAY   . PREVIDENT 5000 BOOSTER PLUS 1.1 % Dental Paste    . rosuvastatin (CRESTOR) 5 mg Oral Tablet TAKE 1 TABLET BY MOUTH EVERY DAY   . topiramate (TOPAMAX) 100 mg Oral Tablet Take 300 mg by mouth Twice daily   . venlafaxine (EFFEXOR XR) 75 mg Oral Capsule, Sust. Release 24 hr TAKE 1 CAPSULE BY MOUTH EVERY DAY   . VENTOLIN INHL take by inhalation. As needed   . ZYRTEC-D 5-120 mg Oral Tablet Sustained Release 12 hr TAKE 1 TABLET BY MOUTH TWICE A DAY     Allergies   Allergen Reactions   . Compazine [Prochlorperazine Edisylate] Seizure   . Haldol [Haloperidol]    . Periactin [Cyproheptadine] Anaphylaxis   . Phenergan [Promethazine] Seizure   . Reglan [Metoclopramide] Seizure     Past Medical History:   Diagnosis  Date   . Angiomyolipoma of kidney    . Asthma    . CVA (cerebrovascular accident) (CMS [place])    . Environmental allergies    . Epilepsy (CMS [place])    . HTN    . Hypothyroid    . IBS (irritable bowel syndrome)    . Insomnia    . Migraines      Past Surgical History:   Procedure Laterality Date   . HX HAND SURGERY       Family Medical History:     Problem Relation (Age of Onset)    Elevated Lipids Mother    Heart Attack Maternal Grandfather (42), Maternal Grandmother (70)    Hypertension (High Blood Pressure) Maternal Grandmother, Mother        Social History     Tobacco Use   . Smoking status: Current Some Day Smoker     Packs/day: 0.10     Years: 3.00     Pack years: 0.30     Types: Cigarettes   . Smokeless  tobacco: Never Used   . Tobacco comment: "maybe about a pack a month, on and off for 3 years"   Substance Use Topics   . Alcohol use: No   . Drug use: No     Comment: past use of marijuana in college     Review of Systems   Constitutional: Negative.    HENT: Negative.    Eyes:        Excess skin of upper and lower eyelids   Respiratory: Negative.    Cardiovascular: Negative.    Gastrointestinal: Negative.    Endocrine: Negative.    Genitourinary: Negative.    Musculoskeletal: Negative.    Skin: Negative.    Allergic/Immunologic: Negative.    Neurological: Negative.    Hematological: Negative.    Psychiatric/Behavioral: Negative.      Objective:   Vitals: Pulse 76   Resp 16   Ht 1.676 m (5\' 6" )   Wt 64.4 kg (142 lb)   SpO2 98%   BMI 22.92 kg/m     Physical Exam  Constitutional:       General: She is not in acute distress.     Appearance: Normal appearance. She is normal weight. She is not toxic-appearing or diaphoretic.   HENT:      Head: Normocephalic and atraumatic.      Nose: Nose normal. No congestion or rhinorrhea.   Eyes:      General: No scleral icterus.        Right eye: No discharge.         Left eye: No discharge.      Extraocular Movements: Extraocular movements intact.      Conjunctiva/sclera: Conjunctivae normal.      Pupils: Pupils are equal, round, and reactive to light.   Neck:      Musculoskeletal: Normal range of motion. No neck rigidity.   Pulmonary:      Effort: Pulmonary effort is normal. No respiratory distress.      Breath sounds: No stridor.   Abdominal:      General: Abdomen is flat.   Musculoskeletal: Normal range of motion.         General: No swelling.   Skin:     General: Skin is warm and dry.      Coloration: Skin is not jaundiced or pale.      Findings: No bruising, erythema, lesion or rash.   Neurological:      General:  No focal deficit present.      Mental Status: She is alert and oriented to person, place, and time.      Sensory: No sensory deficit.   Psychiatric:         Mood  and Affect: Mood normal.         Behavior: Behavior normal.         Thought Content: Thought content normal.         Judgment: Judgment normal.       Snap test is good. Lid position is normal. No conjunctivitis. She has moderate upper lid dermatochalasis. She has peri orbital fat protrusion of the lower lids.   Assessment & Plan:     ENCOUNTER DIAGNOSES     ICD-10-CM   1. 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 CBC/DIFF   . COMPREHENSIVE METABOLIC PANEL, NON-FASTING   . MAGNESIUM   . SEDIMENTATION RATE   . ECG 12 LEAD - ADULT   . POCT URINE HCG   . erythromycin (ROMYCIN) 5 mg/gram (0.5 %) Ophthalmic Ointment     A lengthy discussion was held regarding the option for facial aesthetic improvement withupper lid and lower lid blepharoplastyfor improvement upper and lower lid dermatochalasis periorbital fat protrusion.The operative procedure, perioperative care and postoperative disability was discussed at length and understood. The specific risks including bleeding, scars, infection, possible nerve injury as well as the potential forectropion entropionasymmetry or unacceptable cosmetic result and possible need for secondary procedures was discussed and understood. The placement of the incisions in theupper lid tarsal crease extending laterally and sub ciliary incision was reviewed.The pros and cons of a transconjunctival approach were discussed. All of the patients questions were answered.  We plan bilateral lower lid blepharoplasty with a sub ciliary incision with a skin skin muscle flap.  Written informed consent was obtained and preoperative laboratory and instructions were reviewed.    Return in about 10 days (around 06/28/2019) for next appointment after tests.    Marita Snellen, LPN  I am scribing for, and in the presence of, Dr. Lupita Raider for services provided on 06/18/2019.  I have reviewed and confirmed the  ROS, PFSH, and all other elements documented by the scribe.  The scribed portion of the progress note was scribed on my behalf and at my direction. I have reviewed this and attest to the accuracy of the note.    Clarice Pole MD

## 2019-06-21 ENCOUNTER — Telehealth (HOSPITAL_BASED_OUTPATIENT_CLINIC_OR_DEPARTMENT_OTHER): Payer: Self-pay | Admitting: Plastic Surgery

## 2019-06-21 NOTE — Telephone Encounter (Signed)
1st attempt to follow up with patient in regards to pre-payment of her upcoming blepharoplasty on 06/28/2019. She completed her pre-op visit with Dr. Lupita Raider on 06/18/2019, and was provided with detailed payment instructions.     Teresa Ramirez  06/21/2019, 14:50

## 2019-06-25 ENCOUNTER — Other Ambulatory Visit: Payer: Self-pay

## 2019-06-25 ENCOUNTER — Ambulatory Visit
Admission: RE | Admit: 2019-06-25 | Discharge: 2019-06-25 | Disposition: A | Discharge status: Home / Self Care | Payer: Medicare (Managed Care) | Source: Ambulatory Visit | Attending: Plastic Surgery | Admitting: Plastic Surgery

## 2019-06-25 ENCOUNTER — Ambulatory Visit (HOSPITAL_COMMUNITY): Payer: Medicare (Managed Care)

## 2019-06-25 DIAGNOSIS — H02835 Dermatochalasis of left lower eyelid: Secondary | ICD-10-CM | POA: Insufficient documentation

## 2019-06-25 DIAGNOSIS — H02834 Dermatochalasis of left upper eyelid: Secondary | ICD-10-CM | POA: Insufficient documentation

## 2019-06-25 DIAGNOSIS — H02832 Dermatochalasis of right lower eyelid: Secondary | ICD-10-CM

## 2019-06-25 DIAGNOSIS — H02831 Dermatochalasis of right upper eyelid: Secondary | ICD-10-CM | POA: Insufficient documentation

## 2019-06-25 LAB — CBC WITH DIFF
BASOPHIL #: 0 10*3/uL (ref 0.00–0.20)
BASOPHIL %: 0 %
EOSINOPHIL #: 0 10*3/uL (ref 0.00–0.50)
EOSINOPHIL %: 0 %
HCT: 40.2 % (ref 34.6–46.2)
HGB: 13.3 g/dL (ref 11.8–15.8)
LYMPHOCYTE #: 2.5 10*3/uL (ref 0.90–3.40)
LYMPHOCYTE %: 21 %
MCH: 29.9 pg (ref 27.6–33.2)
MCHC: 33.1 g/dL (ref 32.6–35.4)
MCV: 90.4 fL (ref 82.3–96.7)
MONOCYTE #: 0.6 10*3/uL (ref 0.20–0.90)
MONOCYTE %: 5 %
MPV: 10.2 fL (ref 6.6–10.2)
NEUTROPHIL #: 8.9 10*3/uL — ABNORMAL HIGH (ref 1.50–6.40)
NEUTROPHIL %: 74 %
PLATELETS: 312 10*3/uL (ref 140–440)
RBC: 4.45 10*6/uL (ref 3.80–5.24)
RDW: 14.5 % (ref 12.4–15.2)
WBC: 11.9 10*3/uL — ABNORMAL HIGH (ref 3.5–10.3)

## 2019-06-25 LAB — COMPREHENSIVE METABOLIC PANEL, NON-FASTING
ALBUMIN: 4.4 g/dL (ref 3.2–4.6)
ALKALINE PHOSPHATASE: 88 U/L (ref 20–130)
ALT (SGPT): 12 U/L (ref <=52)
ANION GAP: 7 mmol/L
AST (SGOT): 16 U/L (ref <=35)
BILIRUBIN TOTAL: 0.4 mg/dL (ref 0.3–1.2)
BUN/CREA RATIO: 12
BUN: 13 mg/dL (ref 10–25)
CALCIUM: 9.9 mg/dL (ref 8.8–10.3)
CHLORIDE: 104 mmol/L (ref 98–111)
CO2 TOTAL: 24 mmol/L (ref 21–35)
CREATININE: 1.09 mg/dL (ref <=1.30)
ESTIMATED GFR: 56 mL/min/{1.73_m2} — ABNORMAL LOW
GLUCOSE: 92 mg/dL (ref 70–110)
POTASSIUM: 3.8 mmol/L (ref 3.5–5.0)
PROTEIN TOTAL: 7.3 g/dL (ref 6.0–8.3)
SODIUM: 135 mmol/L (ref 135–145)

## 2019-06-25 LAB — ECG 12 LEAD - ADULT
Calculated P Axis: 86 deg
Calculated T Axis: 43 deg
EKG Severity: BORDERLINE
Heart Rate: 83 {beats}/min
I 40 Axis: 33 deg
PR Interval: 142 ms
QRS Axis: 59 deg
QRS Duration: 89 ms
QT Interval: 383 ms
QTC Calculation: 450 ms
ST Axis: 249 deg
T 40 Axis: INVALID deg

## 2019-06-25 LAB — SEDIMENTATION RATE: ERYTHROCYTE SEDIMENTATION RATE (ESR): 44 mm/h — ABNORMAL HIGH (ref 0–30)

## 2019-06-25 LAB — MAGNESIUM: MAGNESIUM: 2.1 mg/dL (ref 1.8–2.3)

## 2019-06-26 ENCOUNTER — Telehealth (HOSPITAL_BASED_OUTPATIENT_CLINIC_OR_DEPARTMENT_OTHER): Payer: Self-pay | Admitting: Plastic Surgery

## 2019-06-26 NOTE — Telephone Encounter (Signed)
Pt called in with concerns of her WBC being slightly elevated. She states she doesn't feel bad but wanted to talk to Dr Lupita Raider about proceeding with the surgery scheduled for 06/28/2019.  Vaughan Basta, MA  06/26/2019, 08:14

## 2019-06-26 NOTE — Telephone Encounter (Signed)
Patient paid in full on 06/25/2019 via cashier's checks.    Teresa Ramirez  06/26/2019, 17:09

## 2019-06-27 NOTE — Telephone Encounter (Signed)
I returned the patient's call as she is scheduled for elective cosmetic lower lid blepharoplasty.  Her laboratory was reviewed and she was noted to have an elevated white blood cell count with elevated erythrocyte sedimentation rate.  The patient related that when she called in yesterday she felt fine but is now concerned that she feels like she may be developing a cold with upper respiratory symptoms.  I advised her that her surgery is purely elective and given her recent symptoms it would probably be wise to reschedule her which she agreed.  She was advised that her symptoms worsen that she should contact her primary care physician.    Flonnie Hailstone, MD  06/27/2019, 15:30

## 2019-07-03 ENCOUNTER — Encounter (HOSPITAL_BASED_OUTPATIENT_CLINIC_OR_DEPARTMENT_OTHER): Payer: Self-pay | Admitting: Plastic Surgery

## 2019-07-12 NOTE — Telephone Encounter (Signed)
Patients procedure has been rescheduled for Aug 28, 2019.     Megan    Message text      Copied message from [place], dated 07/11/2019.     Doyce Loose  07/12/2019, 15:30

## 2019-07-13 ENCOUNTER — Encounter (HOSPITAL_BASED_OUTPATIENT_CLINIC_OR_DEPARTMENT_OTHER): Payer: Self-pay | Admitting: Plastic Surgery

## 2019-07-16 ENCOUNTER — Encounter (HOSPITAL_BASED_OUTPATIENT_CLINIC_OR_DEPARTMENT_OTHER): Payer: Self-pay | Admitting: Plastic Surgery

## 2019-07-24 ENCOUNTER — Other Ambulatory Visit (INDEPENDENT_AMBULATORY_CARE_PROVIDER_SITE_OTHER): Payer: Self-pay | Admitting: Obstetrics & Gynecology

## 2019-07-24 NOTE — Telephone Encounter (Signed)
LOV 04/19/19  NOV 04/24/20    Medication pended and routed.     Davionte Lusby, [facility]  07/24/2019, 11:04

## 2019-08-13 ENCOUNTER — Encounter (HOSPITAL_BASED_OUTPATIENT_CLINIC_OR_DEPARTMENT_OTHER): Payer: Self-pay | Admitting: Plastic Surgery

## 2019-08-15 ENCOUNTER — Encounter (HOSPITAL_BASED_OUTPATIENT_CLINIC_OR_DEPARTMENT_OTHER): Payer: Medicare (Managed Care) | Admitting: Plastic Surgery

## 2019-08-20 ENCOUNTER — Telehealth (HOSPITAL_BASED_OUTPATIENT_CLINIC_OR_DEPARTMENT_OTHER): Payer: Self-pay

## 2019-08-20 NOTE — Telephone Encounter (Signed)
I called patient to reschedule her office visit scheduled for 08/21/19 with Dr. Lupita Raider as he will not be in the office. Left voicemail to contact office for rescheduling.

## 2019-08-21 ENCOUNTER — Encounter (HOSPITAL_BASED_OUTPATIENT_CLINIC_OR_DEPARTMENT_OTHER): Payer: Medicare (Managed Care) | Admitting: Plastic Surgery

## 2019-09-03 ENCOUNTER — Encounter (HOSPITAL_BASED_OUTPATIENT_CLINIC_OR_DEPARTMENT_OTHER): Payer: Self-pay | Admitting: Plastic Surgery

## 2019-09-07 ENCOUNTER — Encounter (HOSPITAL_BASED_OUTPATIENT_CLINIC_OR_DEPARTMENT_OTHER): Payer: Self-pay | Admitting: Plastic Surgery

## 2019-09-17 ENCOUNTER — Encounter (HOSPITAL_BASED_OUTPATIENT_CLINIC_OR_DEPARTMENT_OTHER): Payer: Medicare (Managed Care) | Admitting: Plastic Surgery

## 2019-10-12 ENCOUNTER — Ambulatory Visit (INDEPENDENT_AMBULATORY_CARE_PROVIDER_SITE_OTHER): Payer: Self-pay | Admitting: Obstetrics & Gynecology

## 2019-10-12 ENCOUNTER — Other Ambulatory Visit (INDEPENDENT_AMBULATORY_CARE_PROVIDER_SITE_OTHER): Payer: Self-pay | Admitting: Obstetrics & Gynecology

## 2019-10-12 NOTE — Telephone Encounter (Signed)
Regarding: OUT OF MEDS//[name]  ----- Message from Shawnie Pons sent at 10/12/2019  3:22 PM EST -----  [name]    Patient's pharmacy will not fill her script because the way it is written, it is too soon.    She only takes 2-3 days worth of the placebo pills and then starts the regular pills again.  Doing this made her come up short and she will need a new script with the correct dosage.  Pharmacy suggests a 4 month refill rather than 90 day.    Preferred Pharmacy       [id number] [place], [place] - [address]    [place] [address]    Phone: [phone] Fax: [phone]    Hours: Not open 24 hours

## 2019-10-12 NOTE — Telephone Encounter (Signed)
Pharmacy requested new RX with a note saying the patient takes continuously. ROuted to provider.       Jonnie Kind, MA

## 2019-10-12 NOTE — Telephone Encounter (Signed)
Already routed to provider in another encounter. Provider is on call and just hasn't signed the new order yet.     Jonnie Kind, MA

## 2019-10-13 MED ORDER — NORTREL 7/7/7 (28) 0.5 MG/0.75 MG/1 MG-35 MCG TABLET
ORAL_TABLET | ORAL | 3 refills | Status: DC
Start: 2019-10-13 — End: 2020-08-27

## 2019-10-16 ENCOUNTER — Telehealth (HOSPITAL_BASED_OUTPATIENT_CLINIC_OR_DEPARTMENT_OTHER): Payer: Self-pay | Admitting: Plastic Surgery

## 2019-10-16 NOTE — Telephone Encounter (Signed)
Called this patient today to see if she wants to get rescheduled to come in for a pre-op visit.  She paid for her surgery in November/2020 but has never been scheduled yet due to her being sick/Dr. Lupita Raider being off on medical leave as well.  No answer, left a message for her to call us back and let us know if she is still interested or does she want her money refunded.  Leslee Home  10/16/2019, 14:02

## 2019-10-24 ENCOUNTER — Telehealth (HOSPITAL_BASED_OUTPATIENT_CLINIC_OR_DEPARTMENT_OTHER): Payer: Self-pay | Admitting: Plastic Surgery

## 2019-10-24 NOTE — Telephone Encounter (Signed)
I called the patient to follow up with her expected surgery for bilateral blepharoplasty she has canceled her surgery several times and has already paid for the services. Pt did not answer so I left a message asking her to call back. Vaughan Basta, MA  10/24/2019, 15:29

## 2019-10-26 ENCOUNTER — Telehealth (HOSPITAL_BASED_OUTPATIENT_CLINIC_OR_DEPARTMENT_OTHER): Payer: Self-pay

## 2019-10-26 NOTE — Telephone Encounter (Signed)
I tried to reach patient in regards to rescheduling her procedure. I left a voicemail for return call.    Marita Snellen, LPN

## 2019-11-29 ENCOUNTER — Encounter (HOSPITAL_BASED_OUTPATIENT_CLINIC_OR_DEPARTMENT_OTHER): Payer: Self-pay | Admitting: Plastic Surgery

## 2019-11-29 NOTE — Telephone Encounter (Signed)
Several attempts have been made to reach the patient but all have been unsuccessful.     Sent a MyChart message as well as a letter via [employer] to find out if the patient is still interested in a blepharoplasty.      In the event she no longer desire to have the procedure, I requested she supply her current contact information so we can initiate the refund of her cosmetic payment.    Doyce Loose  11/29/2019, 10:41

## 2019-12-03 NOTE — Telephone Encounter (Signed)
Another attempt to reach patient on her cell phone was unsuccessful. I was able to speak with her mother, Teresa Ramirez, and was able to confirm patient's phone number and address.   Per Ms. Spotloe, Teresa Ramirez is currently visiting [place], and may have her phone turned off because of migraines. She will relay message to her daughter to call our office.     Teresa Ramirez  12/03/2019, 11:54

## 2019-12-18 ENCOUNTER — Encounter (HOSPITAL_COMMUNITY): Payer: Self-pay

## 2019-12-26 NOTE — Telephone Encounter (Signed)
Finally got ahold of this patient..yay!.  She states that a lot of things have happened.  She states that she went to the eye doctor for glasses and found out that her eyes do not close all the way.  So she just had a surgery on the corners of her eyes by Dr. Kennith Center at [facility].  She says that this will take 3 weeks to a month to heal up.  After that she says she is still interested in getting the bleph done.  She does not want a refund at this time.  She will call soon to get herself rescheduled.  Leslee Home  12/26/2019, 11:40

## 2019-12-27 ENCOUNTER — Encounter

## 2020-04-06 ENCOUNTER — Emergency Department (HOSPITAL_COMMUNITY): Payer: Medicare PPO

## 2020-04-06 ENCOUNTER — Other Ambulatory Visit: Payer: Self-pay

## 2020-04-06 ENCOUNTER — Observation Stay (HOSPITAL_COMMUNITY)
Admission: EM | Admit: 2020-04-06 | Discharge: 2020-04-07 | Disposition: A | Discharge status: Home / Self Care | Payer: Medicare PPO | Attending: Internal Medicine | Admitting: Internal Medicine

## 2020-04-06 ENCOUNTER — Encounter (HOSPITAL_COMMUNITY): Payer: Self-pay | Admitting: Emergency Medicine

## 2020-04-06 DIAGNOSIS — R8290 Unspecified abnormal findings in urine: Secondary | ICD-10-CM | POA: Insufficient documentation

## 2020-04-06 DIAGNOSIS — Z79899 Other long term (current) drug therapy: Secondary | ICD-10-CM | POA: Insufficient documentation

## 2020-04-06 DIAGNOSIS — E876 Hypokalemia: Secondary | ICD-10-CM | POA: Diagnosis present

## 2020-04-06 DIAGNOSIS — R112 Nausea with vomiting, unspecified: Secondary | ICD-10-CM | POA: Diagnosis not present

## 2020-04-06 DIAGNOSIS — K529 Noninfective gastroenteritis and colitis, unspecified: Secondary | ICD-10-CM | POA: Diagnosis not present

## 2020-04-06 DIAGNOSIS — E039 Hypothyroidism, unspecified: Secondary | ICD-10-CM | POA: Diagnosis not present

## 2020-04-06 DIAGNOSIS — E785 Hyperlipidemia, unspecified: Secondary | ICD-10-CM | POA: Diagnosis present

## 2020-04-06 DIAGNOSIS — N200 Calculus of kidney: Secondary | ICD-10-CM

## 2020-04-06 DIAGNOSIS — R1031 Right lower quadrant pain: Secondary | ICD-10-CM | POA: Diagnosis present

## 2020-04-06 DIAGNOSIS — Z20822 Contact with and (suspected) exposure to covid-19: Secondary | ICD-10-CM | POA: Insufficient documentation

## 2020-04-06 DIAGNOSIS — F32A Depression, unspecified: Secondary | ICD-10-CM | POA: Diagnosis present

## 2020-04-06 DIAGNOSIS — I10 Essential (primary) hypertension: Secondary | ICD-10-CM

## 2020-04-06 LAB — COMPREHENSIVE METABOLIC PANEL
ALT: 18 U/L (ref 0–44)
AST: 62 U/L — ABNORMAL HIGH (ref 15–41)
Albumin: 4.1 g/dL (ref 3.5–5.0)
Alkaline Phosphatase: 66 U/L (ref 38–126)
Anion gap: 18 — ABNORMAL HIGH (ref 5–15)
BUN: 19 mg/dL (ref 6–20)
CO2: 18 mmol/L — ABNORMAL LOW (ref 22–32)
Calcium: 8.6 mg/dL — ABNORMAL LOW (ref 8.9–10.3)
Chloride: 101 mmol/L (ref 98–111)
Creatinine, Ser: 0.77 mg/dL (ref 0.44–1.00)
GFR calc Af Amer: >60 mL/min (ref >60)
GFR calc non Af Amer: >60 mL/min (ref >60)
Glucose, Bld: 98 mg/dL (ref 70–99)
Potassium: 2.7 mmol/L — CL (ref 3.5–5.1)
Sodium: 137 mmol/L (ref 135–145)
Total Bilirubin: 0.8 mg/dL (ref 0.3–1.2)
Total Protein: 8.4 g/dL — ABNORMAL HIGH (ref 6.5–8.1)

## 2020-04-06 LAB — URINALYSIS, ROUTINE W REFLEX MICROSCOPIC
Bilirubin Urine: NEGATIVE
Glucose, UA: NEGATIVE mg/dL
Ketones, ur: 20 mg/dL — AB
Leukocytes,Ua: NEGATIVE
Nitrite: NEGATIVE
Protein, ur: 100 mg/dL — AB
RBC / HPF: >50 RBC/hpf — ABNORMAL HIGH (ref 0–5)
Specific Gravity, Urine: 1.02 (ref 1.005–1.030)
pH: 6 (ref 5.0–8.0)

## 2020-04-06 LAB — CBC
HCT: 43.5 % (ref 36.0–46.0)
Hemoglobin: 14.7 g/dL (ref 12.0–15.0)
MCH: 32.9 pg (ref 26.0–34.0)
MCHC: 33.8 g/dL (ref 30.0–36.0)
MCV: 97.3 fL (ref 80.0–100.0)
Platelets: 279 10*3/uL (ref 150–400)
RBC: 4.47 MIL/uL (ref 3.87–5.11)
RDW: 13.4 % (ref 11.5–15.5)
WBC: 6.1 10*3/uL (ref 4.0–10.5)
nRBC: 0 % (ref 0.0–0.2)

## 2020-04-06 LAB — SARS CORONAVIRUS 2 BY RT PCR (HOSPITAL ORDER, PERFORMED IN ~~LOC~~ HOSPITAL LAB): SARS Coronavirus 2: NEGATIVE

## 2020-04-06 LAB — I-STAT BETA HCG BLOOD, ED (MC, WL, AP ONLY): I-stat hCG, quantitative: <5 m[IU]/mL (ref <5)

## 2020-04-06 LAB — LIPASE, BLOOD: Lipase: 30 U/L (ref 11–51)

## 2020-04-06 IMAGING — CT CT ABD-PELV W/ CM
2 of 5 series · 16 of 46 positions shown, 18 images · IV contrast (omnipaque)
Comparison: Report from CT abdomen pelvis dated [DATE].

CLINICAL DATA: Abdominal pain, nausea and vomiting.

EXAM:
CT ABDOMEN AND PELVIS WITH CONTRAST
TECHNIQUE: Multidetector CT imaging of the abdomen and pelvis was performed
using the standard protocol following bolus administration of
intravenous contrast.
CONTRAST:  100mL OMNIPAQUE IOHEXOL 300 MG/ML  SOLN

[Series 2: axial st · axial · 0.75mm/px · z∈[-466,-31]mm · 13 of 101 slices shown, 15 images]
[im 7/101  soft-tissue]
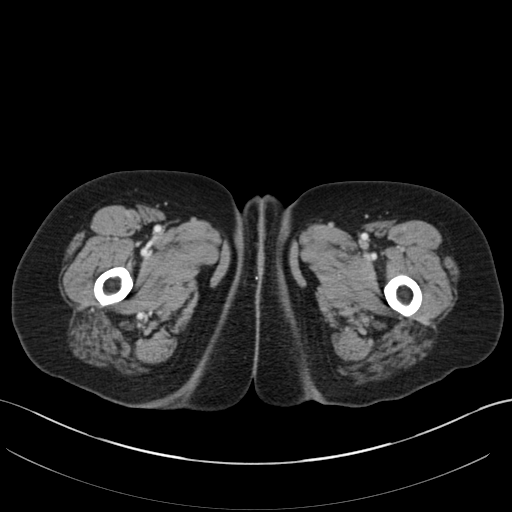
[im 7/101  bone]
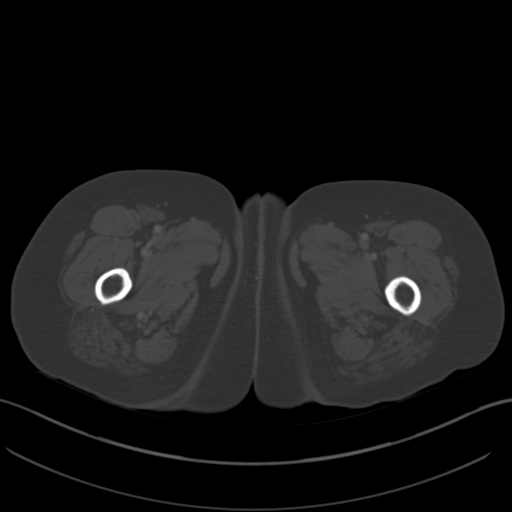
[im 14/101  soft-tissue]
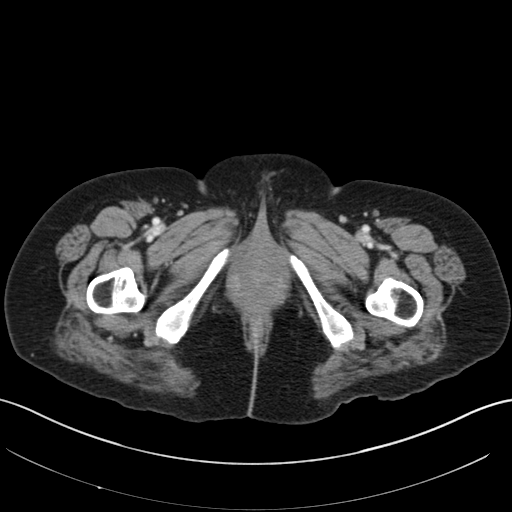
[im 21/101  soft-tissue]
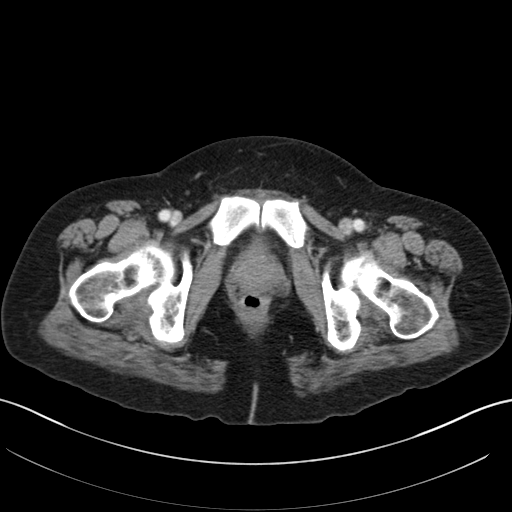
[im 27/101  soft-tissue]
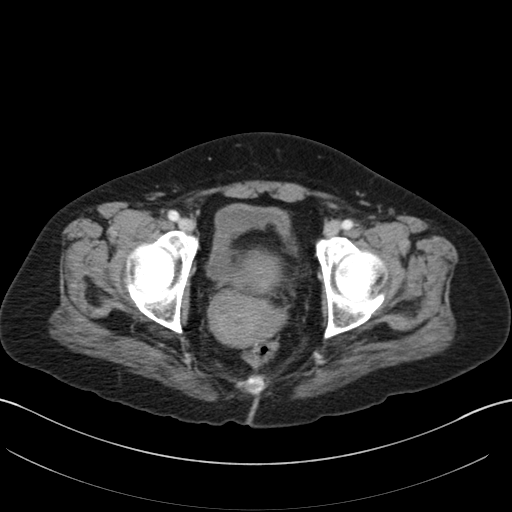
[im 34/101  soft-tissue]
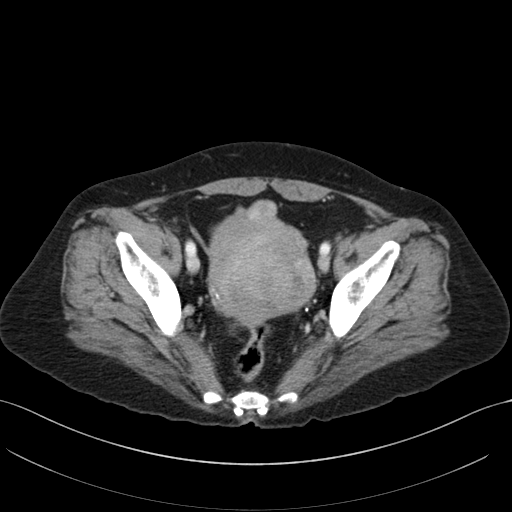
[im 41/101  soft-tissue]
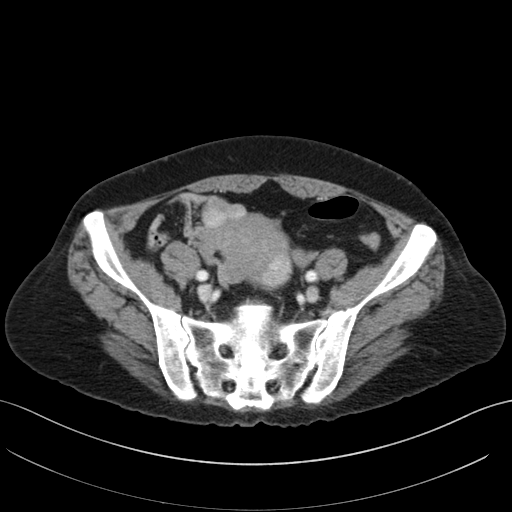
[im 54/101  soft-tissue]
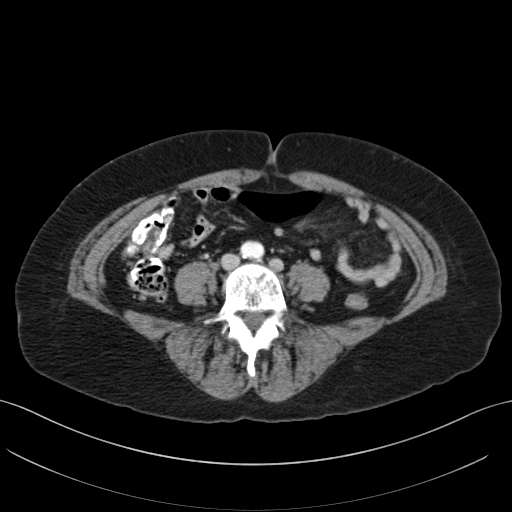
[im 61/101  soft-tissue]
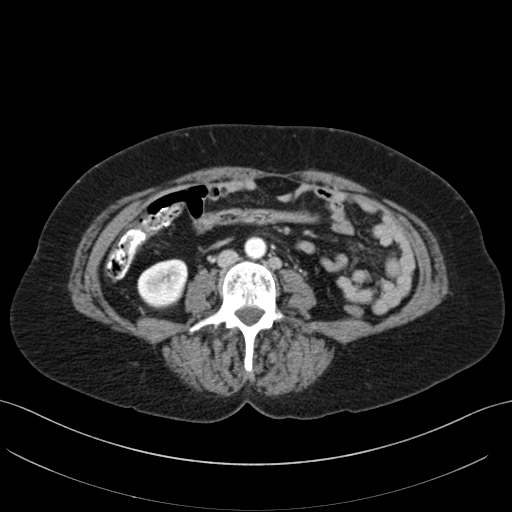
[im 67/101  soft-tissue]
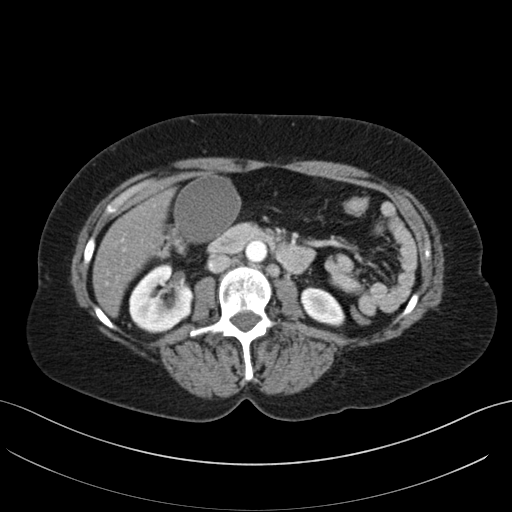
[im 67/101  bone]
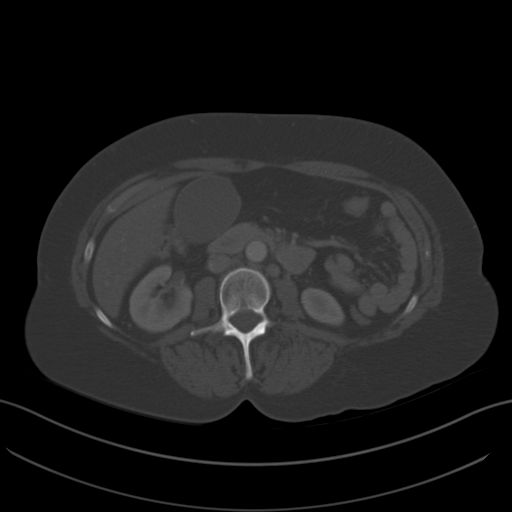
[im 74/101  soft-tissue]
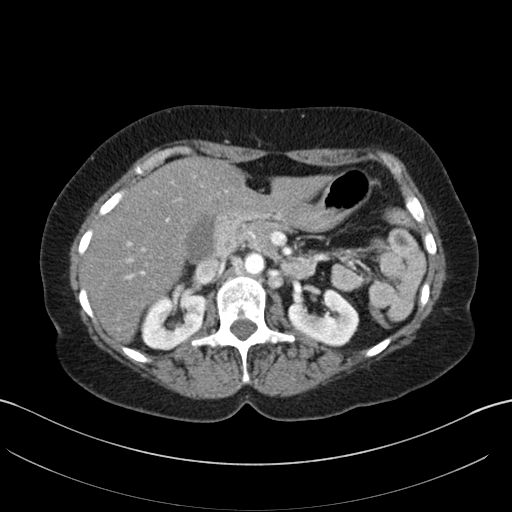
[im 81/101  soft-tissue]
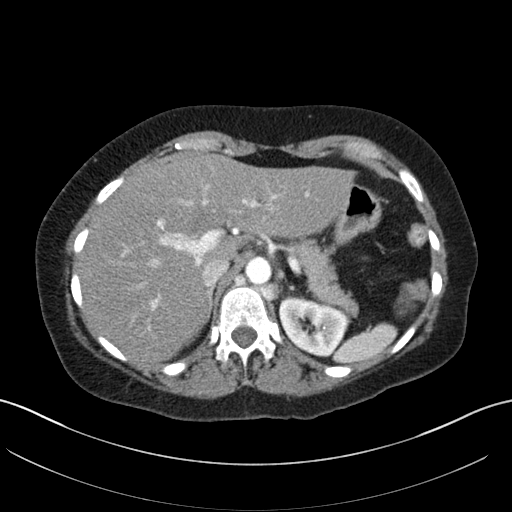
[im 87/101  soft-tissue]
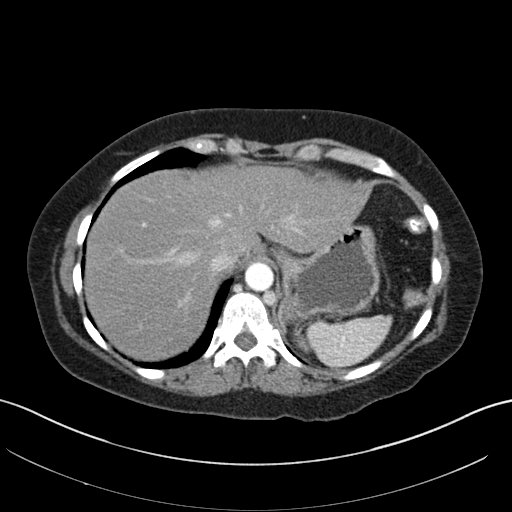
[im 94/101  soft-tissue]
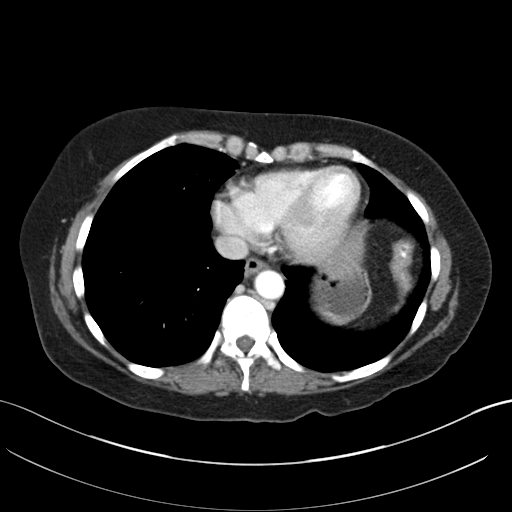

[Series 4: coronal st · coronal · 0.78mm/px · 3 of 115 slices shown]
[im 39/115  soft-tissue]
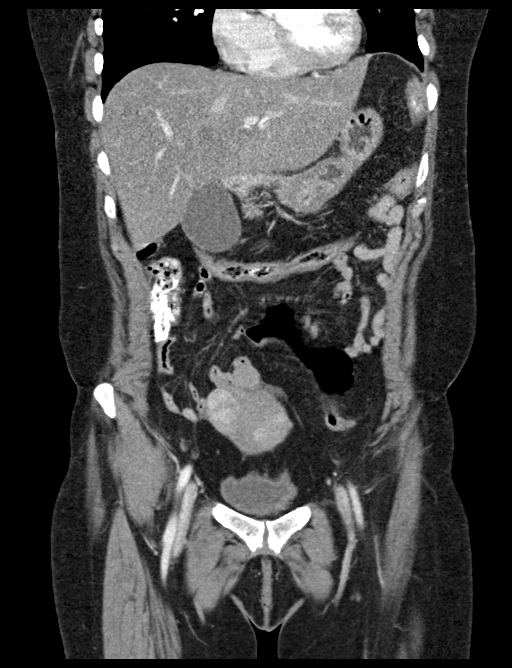
[im 51/115  soft-tissue]
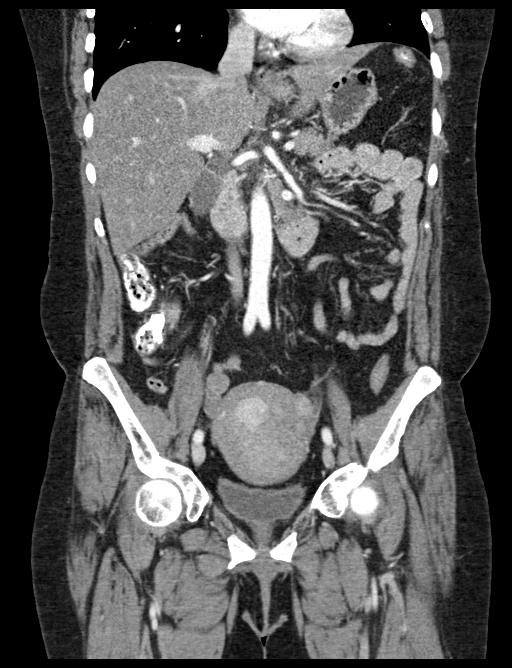
[im 64/115  soft-tissue]
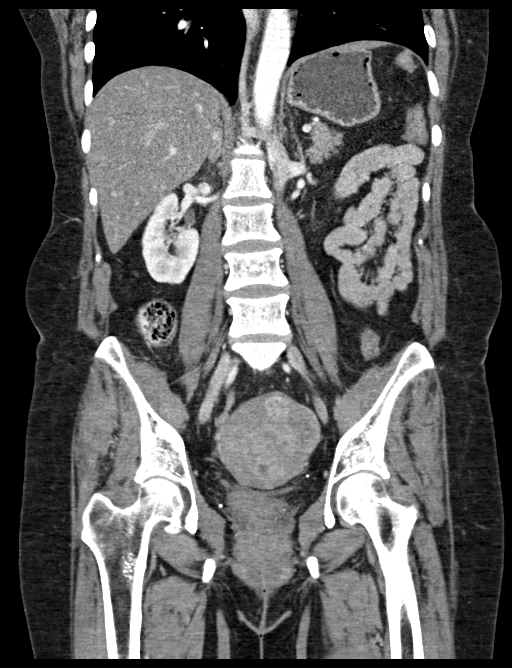

[16 of 46 positions shown; findings below may reference images not displayed]

FINDINGS: Lower chest: No acute abnormality.

Hepatobiliary: A 7 mm cyst is seen in the right hepatic lobe. No
gallstones, gallbladder wall thickening, or biliary dilatation.

Pancreas: Unremarkable. No pancreatic ductal dilatation or
surrounding inflammatory changes.

Spleen: Normal in size without focal abnormality.

Adrenals/Urinary Tract: Adrenal glands are unremarkable. A fat
density mass in the right kidney is consistent with an
angiomyolipoma measuring 2.5 cm. A nonobstructing right renal
calculus measures 3 mm. There is no hydronephrosis on the right. The
left kidney appears normal, without renal calculi, focal lesion, or
hydronephrosis. Bladder is unremarkable.

Stomach/Bowel: Stomach is within normal limits. Appendix appears
normal. No evidence of bowel wall thickening, distention, or
inflammatory changes.

Vascular/Lymphatic: Aortic atherosclerosis. No enlarged abdominal or
pelvic lymph nodes.

Reproductive: The uterus is enlarged with multiple fibroids. The
adnexa appear normal.

Other: No abdominal wall hernia or abnormality. No abdominopelvic
ascites.

Musculoskeletal: No acute or significant osseous findings.
IMPRESSION: 1. No acute findings in the abdomen or pelvis.

Aortic Atherosclerosis ([0U]-[0U]).

## 2020-04-06 MED ORDER — POTASSIUM CHLORIDE IN NACL 20-0.9 MEQ/L-% IV SOLN
Freq: Once | INTRAVENOUS | Status: AC
Start: 2020-04-06 — End: 2020-04-06
  Filled 2020-04-06: qty 1000

## 2020-04-06 MED ORDER — ONDANSETRON 4 MG PO TBDP
4.0000 mg | ORAL_TABLET | Freq: Once | ORAL | Status: AC | PRN
  Administered 2020-04-06: 4 mg via ORAL
  Filled 2020-04-06: qty 1

## 2020-04-06 MED ORDER — SODIUM CHLORIDE (PF) 0.9 % IJ SOLN
INTRAMUSCULAR | Status: AC
  Filled 2020-04-06: qty 50

## 2020-04-06 MED ORDER — ACETAMINOPHEN 325 MG PO TABS
650.0000 mg | ORAL_TABLET | Freq: Four times a day (QID) | ORAL | Status: DC | PRN
  Administered 2020-04-07: 650 mg via ORAL
  Filled 2020-04-06: qty 2

## 2020-04-06 MED ORDER — KCL IN DEXTROSE-NACL 40-5-0.9 MEQ/L-%-% IV SOLN
INTRAVENOUS | Status: DC
  Filled 2020-04-06 (×3): qty 1000

## 2020-04-06 MED ORDER — ONDANSETRON HCL 4 MG PO TABS: 4.0000 mg | ORAL_TABLET | Freq: Four times a day (QID) | ORAL | Status: DC | PRN

## 2020-04-06 MED ORDER — KETOROLAC TROMETHAMINE 15 MG/ML IJ SOLN
15.0000 mg | Freq: Four times a day (QID) | INTRAMUSCULAR | Status: DC | PRN
  Administered 2020-04-06 – 2020-04-07 (×2): 15 mg via INTRAVENOUS
  Filled 2020-04-06 (×2): qty 1

## 2020-04-06 MED ORDER — FENTANYL CITRATE (PF) 100 MCG/2ML IJ SOLN
25.0000 ug | Freq: Once | INTRAMUSCULAR | Status: AC
  Administered 2020-04-06: 25 ug via INTRAVENOUS
  Filled 2020-04-06: qty 2

## 2020-04-06 MED ORDER — ONDANSETRON HCL 4 MG/2ML IJ SOLN
4.0000 mg | Freq: Four times a day (QID) | INTRAMUSCULAR | Status: DC | PRN
  Administered 2020-04-06: 4 mg via INTRAVENOUS
  Filled 2020-04-06: qty 2

## 2020-04-06 MED ORDER — KETOROLAC TROMETHAMINE 15 MG/ML IJ SOLN
15.0000 mg | Freq: Once | INTRAMUSCULAR | Status: AC
  Administered 2020-04-06: 15 mg via INTRAVENOUS
  Filled 2020-04-06: qty 1

## 2020-04-06 MED ORDER — IOHEXOL 300 MG/ML  SOLN
100.0000 mL | Freq: Once | INTRAMUSCULAR | Status: AC | PRN
  Administered 2020-04-06: 100 mL via INTRAVENOUS

## 2020-04-06 MED ORDER — ONDANSETRON HCL 4 MG/2ML IJ SOLN
4.0000 mg | Freq: Once | INTRAMUSCULAR | Status: AC
  Administered 2020-04-06: 4 mg via INTRAVENOUS
  Filled 2020-04-06: qty 2

## 2020-04-06 MED ORDER — ACETAMINOPHEN 650 MG RE SUPP: 650.0000 mg | Freq: Four times a day (QID) | RECTAL | Status: DC | PRN

## 2020-04-06 MED ORDER — LORAZEPAM 2 MG/ML IJ SOLN
1.0000 mg | Freq: Once | INTRAMUSCULAR | Status: AC
  Administered 2020-04-06: 1 mg via INTRAVENOUS
  Filled 2020-04-06: qty 1

## 2020-04-06 NOTE — ED Provider Notes (Signed)
[name] DEPT Provider Note   CSN: [id number] Arrival date & time: 04/06/20  1349     History Chief Complaint  Patient presents with  . Diarrhea  . Abdominal Pain    Judy Meyer is a 59 y.o. female.  HPI    Patient presents with 1 week of abdominal pain, nausea, vomiting, diarrhea. No fever, chest pain, dyspnea. No clear precipitant. Since onset she has been intolerant of oral intake. Pain is focally in the right lower abdomen. No history of abdominal surgery. History reviewed. No pertinent past medical history.  There are no problems to display for this patient.   History reviewed. No pertinent surgical history.   OB History   No obstetric history on file.     No family history on file.  Social History   Tobacco Use  . Smoking status: Not on file  Substance Use Topics  . Alcohol use: Not on file  . Drug use: Not on file    Home Medications Prior to Admission medications   Medication Sig Start Date End Date Taking? Authorizing Provider  cetirizine-pseudoephedrine (ZYRTEC-D) 5-120 MG tablet Take 1 tablet by mouth 2 (two) times daily.    [provider]  Ergotamine Tartrate (ERGOMAR SL) Place 2 mg under the tongue as needed (migraines).    [provider]  Galcanezumab-gnlm (EMGALITY) 120 MG/ML SOAJ Inject 120 mLs into the skin every 30 (thirty) days. November 2019    [provider]  levothyroxine (SYNTHROID, LEVOTHROID) 75 MCG tablet Take 75 mcg by mouth daily before breakfast.    [provider]  methocarbamol (ROBAXIN) 500 MG tablet Take 1 tablet (500 mg total) by mouth 2 (two) times daily. 07/11/18   Tacy Learn, PA-C  norethindrone-ethinyl estradiol (CYCLAFEM,ALYACEN) 0.5/0.75/1-35 MG-MCG tablet Take 1 tablet by mouth daily.    [provider]  rosuvastatin (CRESTOR) 5 MG tablet Take 5 mg by mouth daily.    [provider]  topiramate (TOPAMAX) 100 MG tablet  Take 100 mg by mouth 2 (two) times daily.    [provider]  venlafaxine XR (EFFEXOR-XR) 75 MG 24 hr capsule Take 75 mg by mouth daily.    [provider]    Allergies    Compazine [prochlorperazine edisylate], Periactin [cyproheptadine], Cyclobenzaprine, Haloperidol and related, and Reglan [metoclopramide]  Review of Systems   Review of Systems  Constitutional:       Per HPI, otherwise negative  HENT:       Per HPI, otherwise negative  Respiratory:       Per HPI, otherwise negative  Cardiovascular:       Per HPI, otherwise negative  Gastrointestinal: Positive for abdominal pain, diarrhea, nausea and vomiting.  Endocrine:       Negative aside from HPI  Genitourinary:       Neg aside from HPI   Musculoskeletal:       Per HPI, otherwise negative  Skin: Negative.   Neurological: Negative for syncope.    Physical Exam Updated Vital Signs BP (!) 175/88   Pulse 92   Temp 97.9 F (36.6 C) (Oral)   Resp 18   SpO2 97%   Physical Exam Vitals and nursing note reviewed.  Constitutional:      Appearance: She is well-developed. She is ill-appearing.  HENT:     Head: Normocephalic and atraumatic.  Eyes:     Conjunctiva/sclera: Conjunctivae normal.  Cardiovascular:     Rate and Rhythm: Normal rate and regular rhythm.  Pulmonary:     Effort: Pulmonary effort is normal. No respiratory distress.     Breath sounds: Normal breath sounds. No stridor.  Abdominal:     General: There is no distension.     Comments: Patient describes the area just superior to the right inguinal crease as the most tender, no distention.  Skin:    General: Skin is warm and dry.  Neurological:     Mental Status: She is alert and oriented to person, place, and time.     Cranial Nerves: No cranial nerve deficit.     ED Results / Procedures / Treatments   Labs (all labs ordered are listed, but only abnormal results are displayed) Labs Reviewed  COMPREHENSIVE METABOLIC PANEL -  Abnormal; Notable for the following components:      Result Value   Potassium 2.7 (*)    CO2 18 (*)    Calcium 8.6 (*)    Total Protein 8.4 (*)    AST 62 (*)    Anion gap 18 (*)    All other components within normal limits  URINALYSIS, ROUTINE W REFLEX MICROSCOPIC - Abnormal; Notable for the following components:   APPearance HAZY (*)    Hgb urine dipstick LARGE (*)    Ketones, ur 20 (*)    Protein, ur 100 (*)    RBC / HPF >50 (*)    Bacteria, UA RARE (*)    Crystals PRESENT (*)    All other components within normal limits  SARS CORONAVIRUS 2 BY RT PCR (HOSPITAL ORDER, [facility])  LIPASE, BLOOD  CBC  I-STAT BETA HCG BLOOD, ED ([facility], [facility], [facility] ONLY)    EKG None  Radiology CT Abdomen Pelvis W Contrast  Result Date: 04/06/2020 CLINICAL DATA:  Abdominal pain, nausea and vomiting. EXAM: CT ABDOMEN AND PELVIS WITH CONTRAST TECHNIQUE: Multidetector CT imaging of the abdomen and pelvis was performed using the standard protocol following bolus administration of intravenous contrast. CONTRAST:  128mL OMNIPAQUE IOHEXOL 300 MG/ML  SOLN COMPARISON:  Report from CT abdomen pelvis dated 08/07/2001. FINDINGS: Lower chest: No acute abnormality. Hepatobiliary: A 7 mm cyst is seen in the right hepatic lobe. No gallstones, gallbladder wall thickening, or biliary dilatation. Pancreas: Unremarkable. No pancreatic ductal dilatation or surrounding inflammatory changes. Spleen: Normal in size without focal abnormality. Adrenals/Urinary Tract: Adrenal glands are unremarkable. 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 A nonobstructing right renal calculus measures 3 mm. There is no hydronephrosis on the right. The left kidney appears normal, without renal calculi, focal lesion, or hydronephrosis. Bladder is unremarkable. Stomach/Bowel: Stomach is within normal limits. Appendix appears normal. No evidence of bowel wall thickening,  distention, or inflammatory changes. Vascular/Lymphatic: Aortic atherosclerosis. No enlarged abdominal or pelvic lymph nodes. Reproductive: The uterus is enlarged with multiple fibroids. The adnexa appear normal. Other: No abdominal wall hernia or abnormality. No abdominopelvic ascites. Musculoskeletal: No acute or significant osseous findings. IMPRESSION: 1. No acute findings in the abdomen or pelvis. Aortic Atherosclerosis (ICD10-I70.0). Electronically Signed   By: Zerita Boers M.D.   On: 04/06/2020 17:16    Procedures Procedures (including critical care time)  Medications Ordered in ED Medications  0.9 % NaCl with KCl 20 mEq/ L  infusion ( Intravenous New Bag/Given 04/06/20 1652)  sodium chloride (PF) 0.9 % injection (has no administration in time range)  ondansetron (ZOFRAN-ODT) disintegrating tablet 4 mg (4 mg Oral Given 04/06/20 1409)  ondansetron (ZOFRAN) injection  4 mg (4 mg Intravenous Given 04/06/20 1645)  fentaNYL (SUBLIMAZE) injection 25 mcg (25 mcg Intravenous Given 04/06/20 1648)  iohexol (OMNIPAQUE) 300 MG/ML solution 100 mL (100 mLs Intravenous Contrast Given 04/06/20 1655)  LORazepam (ATIVAN) injection 1 mg (1 mg Intravenous Given 04/06/20 1759)  ketorolac (TORADOL) 15 MG/ML injection 15 mg (15 mg Intravenous Given 04/06/20 1800)    ED Course  I have reviewed the triage vital signs and the nursing notes.  Pertinent labs & imaging results that were available during my care of the patient were reviewed by me and considered in my medical decision making (see chart for details).  Adult female no history of abdominal surgery presents with new abdominal pain, nausea, vomiting, diarrhea.  Patient is afebrile, no chest pain, dyspnea.  Differential including kidney stone, kidney infection, intra-abdominal pathology all considered. Patient's initial findings notable for hypokalemia, and initial resuscitation included normal saline with potassium repletion, analgesia,  antiemetics.   Update: After initial resuscitation, provision of Zofran, patient continued to have persistent nausea, vomiting.  She is now accompanied by her husband.  6:48 PM Patient minimally better, now after Ativan, Toradol.  These meds were given in addition to ongoing fluids, potassium repletion, and prior Zofran, fentanyl. 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 Patient CT scan otherwise noncontributory, no evidence for obstruction. Given the patient's persistent nausea, vomiting, p.o. intolerance, hypokalemia she will require admission for further monitoring, management, resuscitation.  COVID pending on admission  Final Clinical Impression(s) / ED Diagnoses Final diagnoses:  Intractable nausea and vomiting  Hypokalemia    Carmin Muskrat, MD 04/06/20 1850

## 2020-04-06 NOTE — Plan of Care (Signed)

## 2020-04-06 NOTE — ED Triage Notes (Signed)
Pt reports abd pains with diarrhea x 2 weeks. Denies n/v or urinary problems.

## 2020-04-06 NOTE — H&P (Signed)
History and Physical   Judy Meyer DDU:[id number] DOB: 06-Jul-1961 DOA: 04/06/2020  Referring MD/NP/PA: Dr. Vanita Panda  PCP: System, Pcp Not In   Outpatient Specialists: None  Patient coming from: Home  Chief Complaint: Nausea vomiting and right-sided abdominal pain  HPI: Judy Meyer is a 59 y.o. female with medical history significant of depression, anxiety, hypothyroidism, hyperlipidemia who presented with sudden onset of abdominal pain mainly in the right flank to the right lower quadrant, intractable nausea and vomiting for the last 2 days.  Associated with some diarrhea over the last 1 week.  Denied any fever or chills denied any hematemesis no melena no bright red blood per rectum.  Patient is denying any sick contact.  She is only 1 sick at home.  She was seen and evaluated in the ER.  Symptomatic management initiated.  Her pain is localized to the right lower abdomen and is colicky in nature.  It is currently rated as 7 out of 10 on arrival but relieved with pain medicine.  Patient's urinalysis and CT showed both leukourea as well as hematuria but normal CT finding except for a 3 mm right-sided nonobstructing stone.  Suspicion for nephrolithiasis or some kind of gastroenteritis is done now.  But patient also has potassium of 2.7.  She has been admitted to the hospital for evaluation and treatment.  ED Course: Temperature 97.9 blood pressure 187/87 pulse 92 respirate 22 oxygen sats 97% on room air.  CBC entirely within normal with sodium 137 potassium 2.7 chloride 101 CO2 of 18 BUN 19 creatinine 0.77 calcium 8.6 glucose 98.  Urinalysis showed hazy urine with large hemoglobin negative leukocytes and negative nitrite.  RBCs more than 50 WBC 21-50 squamous epithelial cells 6-10.  CT abdomen pelvis only shows about 3 mm stone nonobstructing in the right ureter.  Patient diagnosed with intractable nausea vomiting possibly secondary to kidney stones.  Review of Systems: As per HPI otherwise  10 point review of systems negative.    History reviewed. No pertinent past medical history.  History reviewed. No pertinent surgical history.   has no history on file for tobacco use, alcohol use, and drug use.  Allergies  Allergen Reactions  . Compazine [Prochlorperazine Edisylate] Anaphylaxis  . Periactin [Cyproheptadine] Anaphylaxis  . Cyclobenzaprine     Palpations   . Haloperidol And Related   . Reglan [Metoclopramide]     No family history on file.   Prior to Admission medications   Medication Sig Start Date End Date Taking? Authorizing Provider  cetirizine-pseudoephedrine (ZYRTEC-D) 5-120 MG tablet Take 1 tablet by mouth 2 (two) times daily.    [provider]  Ergotamine Tartrate (ERGOMAR SL) Place 2 mg under the tongue as needed (migraines).    [provider]  Galcanezumab-gnlm (EMGALITY) 120 MG/ML SOAJ Inject 120 mLs into the skin every 30 (thirty) days. November 2019    [provider]  levothyroxine (SYNTHROID, LEVOTHROID) 75 MCG tablet Take 75 mcg by mouth daily before breakfast.    [provider]  methocarbamol (ROBAXIN) 500 MG tablet Take 1 tablet (500 mg total) by mouth 2 (two) times daily. 07/11/18   Tacy Learn, PA-C  norethindrone-ethinyl estradiol (CYCLAFEM,ALYACEN) 0.5/0.75/1-35 MG-MCG tablet Take 1 tablet by mouth daily.    [provider]  rosuvastatin (CRESTOR) 5 MG tablet Take 5 mg by mouth daily.    [provider]  topiramate (TOPAMAX) 100 MG tablet Take 100 mg by mouth 2 (two) times daily.    [provider]  venlafaxine XR (EFFEXOR-XR) 75 MG 24 hr capsule Take 75 mg by mouth daily.    [provider]    Physical Exam: Vitals:   04/06/20 1406 04/06/20 1804 04/06/20 1830 04/06/20 1930  BP: (!) 170/105 (!) 187/87 (!) 175/88 (!) 171/86  Pulse: 88 87 92 92  Resp: (!) 22 20 18 18   Temp: 97.9 F (36.6 C)     TempSrc: Oral     SpO2: 99% 100% 97% 99%      Constitutional:  Acutely ill looking, in mild distress Vitals:   04/06/20 1406 04/06/20 1804 04/06/20 1830 04/06/20 1930  BP: (!) 170/105 (!) 187/87 (!) 175/88 (!) 171/86  Pulse: 88 87 92 92  Resp: (!) 22 20 18 18   Temp: 97.9 F (36.6 C)     TempSrc: Oral     SpO2: 99% 100% 97% 99%   Eyes: PERRL, lids and conjunctivae normal ENMT: Mucous membranes are dry. Posterior pharynx clear of any exudate or lesions.Normal dentition.  Neck: normal, supple, no masses, no thyromegaly Respiratory: clear to auscultation bilaterally, no wheezing, no crackles. Normal respiratory effort. No accessory muscle use.  Cardiovascular: Regular rate and rhythm, no murmurs / rubs / gallops. No extremity edema. 2+ pedal pulses. No carotid bruits.  Abdomen: Right lower quadrant abdominal tenderness, no masses palpated. No hepatosplenomegaly. Bowel sounds positive.  Musculoskeletal: no clubbing / cyanosis. No joint deformity upper and lower extremities. Good ROM, no contractures. Normal muscle tone.  Skin: no rashes, lesions, ulcers. No induration Neurologic: CN 2-12 grossly intact. Sensation intact, DTR normal. Strength 5/5 in all 4.  Psychiatric: Normal judgment and insight. Alert and oriented x 3. Normal mood.     Labs on Admission: I have personally reviewed following labs and imaging studies  CBC: Recent Labs  Lab 04/06/20 1428  WBC 6.1  HGB 14.7  HCT 43.5  MCV 97.3  PLT 793   Basic Metabolic Panel: Recent Labs  Lab 04/06/20 1428  NA 137  K 2.7*  CL 101  CO2 18*  GLUCOSE 98  BUN 19  CREATININE 0.77  CALCIUM 8.6*   GFR: CrCl cannot be calculated (Unknown ideal weight.). Liver Function Tests: Recent Labs  Lab 04/06/20 1428  AST 62*  ALT 18  ALKPHOS 66  BILITOT 0.8  PROT 8.4*  ALBUMIN 4.1   Recent Labs  Lab 04/06/20 1428  LIPASE 30   No results for input(s): AMMONIA in the last 168 hours. Coagulation Profile: No results for input(s): INR, PROTIME in the last 168 hours. Cardiac Enzymes: No  results for input(s): CKTOTAL, CKMB, CKMBINDEX, TROPONINI in the last 168 hours. BNP (last 3 results) No results for input(s): PROBNP in the last 8760 hours. HbA1C: No results for input(s): HGBA1C in the last 72 hours. CBG: No results for input(s): GLUCAP in the last 168 hours. Lipid Profile: No results for input(s): CHOL, HDL, LDLCALC, TRIG, CHOLHDL, LDLDIRECT in the last 72 hours. Thyroid Function Tests: No results for input(s): TSH, T4TOTAL, FREET4, T3FREE, THYROIDAB in the last 72 hours. Anemia Panel: No results for input(s): VITAMINB12, FOLATE, FERRITIN, TIBC, IRON, RETICCTPCT in the last 72 hours. Urine analysis:    Component Value Date/Time   COLORURINE YELLOW 04/06/2020 1406   APPEARANCEUR HAZY (A) 04/06/2020 1406   LABSPEC 1.020 04/06/2020 1406   PHURINE 6.0 04/06/2020 1406   GLUCOSEU NEGATIVE 04/06/2020 1406   HGBUR LARGE (A) 04/06/2020 1406   BILIRUBINUR NEGATIVE 04/06/2020 1406   KETONESUR 20 (A) 04/06/2020 1406   PROTEINUR 100 (  A) 04/06/2020 1406   NITRITE NEGATIVE 04/06/2020 1406   LEUKOCYTESUR NEGATIVE 04/06/2020 1406   Sepsis Labs: @LABRCNTIP (procalcitonin:4,lacticidven:4) )No results found for this or any previous visit (from the past 240 hour(s)).   Radiological Exams on Admission: CT Abdomen Pelvis W Contrast  Result Date: 04/06/2020 CLINICAL DATA:  Abdominal pain, nausea and vomiting. EXAM: CT ABDOMEN AND PELVIS WITH CONTRAST TECHNIQUE: Multidetector CT imaging of the abdomen and pelvis was performed using the standard protocol following bolus administration of intravenous contrast. CONTRAST:  118mL OMNIPAQUE IOHEXOL 300 MG/ML  SOLN COMPARISON:  Report from CT abdomen pelvis dated 08/07/2001. FINDINGS: Lower chest: No acute abnormality. Hepatobiliary: A 7 mm cyst is seen in the right hepatic lobe. No gallstones, gallbladder wall thickening, or biliary dilatation. Pancreas: Unremarkable. No pancreatic ductal dilatation or surrounding inflammatory changes. Spleen:  Normal in size without focal abnormality. Adrenals/Urinary Tract: Adrenal glands are unremarkable. 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 A nonobstructing right renal calculus measures 3 mm. There is no hydronephrosis on the right. The left kidney appears normal, without renal calculi, focal lesion, or hydronephrosis. Bladder is unremarkable. Stomach/Bowel: Stomach is within normal limits. Appendix appears normal. No evidence of bowel wall thickening, distention, or inflammatory changes. Vascular/Lymphatic: Aortic atherosclerosis. No enlarged abdominal or pelvic lymph nodes. Reproductive: The uterus is enlarged with multiple fibroids. The adnexa appear normal. Other: No abdominal wall hernia or abnormality. No abdominopelvic ascites. Musculoskeletal: No acute or significant osseous findings. IMPRESSION: 1. No acute findings in the abdomen or pelvis. Aortic Atherosclerosis (ICD10-I70.0). Electronically Signed   By: Zerita Boers M.D.   On: 04/06/2020 17:16      Assessment/Plan Principal Problem:   Intractable nausea and vomiting Active Problems:   Depression   Hypokalemia   Hypothyroidism   Hyperlipidemia     #1 intractable nausea vomiting and diarrhea: Possibly some type of gastritis or right-sided nephrolithiasis which she must have passed the stone.  Patient will be admitted for observation and symptomatic control.  She is already responding to initial treatment.  IV fluids.  Nausea vomiting control.  Replete electrolytes.  #2 hypokalemia: Potassium of 2.7.  We will aggressively treat replete IV.  Once nausea vomiting is controlled we may use p.o.  #3 hypothyroidism: Continue with levothyroxine.  #4 hyperlipidemia: On Crestor at home.  Hold for now.  #5 depression: Resume home regimen when able to take p.o.'s   DVT prophylaxis: SCD Code Status: Full code Family Communication: Husband at bedside Disposition Plan: Home Consults  called: None Admission status: Observation  Severity of Illness: The appropriate patient status for this patient is OBSERVATION. Observation status is judged to be reasonable and necessary in order to provide the required intensity of service to ensure the patient's safety. The patient's presenting symptoms, physical exam findings, and initial radiographic and laboratory data in the context of their medical condition is felt to place them at decreased risk for further clinical deterioration. Furthermore, it is anticipated that the patient will be medically stable for discharge from the hospital within 2 midnights of admission. The following factors support the patient status of observation.   " The patient's presenting symptoms include pain in the abdomen with nausea vomiting diarrhea. " The physical exam findings include mild tenderness in the right lower quadrant. " The initial radiographic and laboratory data are evidence of nephrolithiasis.     Barbette Merino MD [facility] Pager [phone]  If 7PM-7AM, please contact night-coverage [email or web address] Password [email or web address]  04/06/2020, 7:37 PM

## 2020-04-07 DIAGNOSIS — E039 Hypothyroidism, unspecified: Secondary | ICD-10-CM | POA: Diagnosis not present

## 2020-04-07 DIAGNOSIS — R8290 Unspecified abnormal findings in urine: Secondary | ICD-10-CM

## 2020-04-07 DIAGNOSIS — N200 Calculus of kidney: Secondary | ICD-10-CM

## 2020-04-07 DIAGNOSIS — R112 Nausea with vomiting, unspecified: Secondary | ICD-10-CM | POA: Diagnosis not present

## 2020-04-07 DIAGNOSIS — D259 Leiomyoma of uterus, unspecified: Secondary | ICD-10-CM

## 2020-04-07 DIAGNOSIS — I10 Essential (primary) hypertension: Secondary | ICD-10-CM

## 2020-04-07 DIAGNOSIS — E785 Hyperlipidemia, unspecified: Secondary | ICD-10-CM | POA: Diagnosis not present

## 2020-04-07 DIAGNOSIS — K529 Noninfective gastroenteritis and colitis, unspecified: Secondary | ICD-10-CM

## 2020-04-07 DIAGNOSIS — E876 Hypokalemia: Secondary | ICD-10-CM

## 2020-04-07 LAB — COMPREHENSIVE METABOLIC PANEL
ALT: 16 U/L (ref 0–44)
AST: 54 U/L — ABNORMAL HIGH (ref 15–41)
Albumin: 3.3 g/dL — ABNORMAL LOW (ref 3.5–5.0)
Alkaline Phosphatase: 55 U/L (ref 38–126)
Anion gap: 12 (ref 5–15)
BUN: 11 mg/dL (ref 6–20)
CO2: 17 mmol/L — ABNORMAL LOW (ref 22–32)
Calcium: 7.8 mg/dL — ABNORMAL LOW (ref 8.9–10.3)
Chloride: 107 mmol/L (ref 98–111)
Creatinine, Ser: 0.8 mg/dL (ref 0.44–1.00)
GFR calc Af Amer: >60 mL/min (ref >60)
GFR calc non Af Amer: >60 mL/min (ref >60)
Glucose, Bld: 120 mg/dL — ABNORMAL HIGH (ref 70–99)
Potassium: 3 mmol/L — ABNORMAL LOW (ref 3.5–5.1)
Sodium: 136 mmol/L (ref 135–145)
Total Bilirubin: 0.9 mg/dL (ref 0.3–1.2)
Total Protein: 6.7 g/dL (ref 6.5–8.1)

## 2020-04-07 LAB — CBC
HCT: 38.2 % (ref 36.0–46.0)
Hemoglobin: 12.2 g/dL (ref 12.0–15.0)
MCH: 32.5 pg (ref 26.0–34.0)
MCHC: 31.9 g/dL (ref 30.0–36.0)
MCV: 101.9 fL — ABNORMAL HIGH (ref 80.0–100.0)
Platelets: 200 10*3/uL (ref 150–400)
RBC: 3.75 MIL/uL — ABNORMAL LOW (ref 3.87–5.11)
RDW: 13.5 % (ref 11.5–15.5)
WBC: 6.4 10*3/uL (ref 4.0–10.5)
nRBC: 0 % (ref 0.0–0.2)

## 2020-04-07 LAB — POTASSIUM: Potassium: 4 mmol/L (ref 3.5–5.1)

## 2020-04-07 LAB — HIV ANTIBODY (ROUTINE TESTING W REFLEX): HIV Screen 4th Generation wRfx: NONREACTIVE

## 2020-04-07 MED ORDER — CLONIDINE HCL 0.1 MG PO TABS
0.1000 mg | ORAL_TABLET | Freq: Once | ORAL | Status: AC
  Administered 2020-04-07: 0.1 mg via ORAL
  Filled 2020-04-07: qty 1

## 2020-04-07 MED ORDER — ALBUTEROL SULFATE (2.5 MG/3ML) 0.083% IN NEBU: 2.5000 mg | INHALATION_SOLUTION | Freq: Four times a day (QID) | RESPIRATORY_TRACT | Status: DC | PRN

## 2020-04-07 MED ORDER — AMLODIPINE BESYLATE 5 MG PO TABS
5.0000 mg | ORAL_TABLET | Freq: Every day | ORAL | Status: DC
  Administered 2020-04-07: 5 mg via ORAL
  Filled 2020-04-07: qty 1

## 2020-04-07 MED ORDER — LEVOTHYROXINE SODIUM 75 MCG PO TABS: 75.0000 ug | ORAL_TABLET | Freq: Every day | ORAL | Status: DC

## 2020-04-07 MED ORDER — POTASSIUM CHLORIDE 10 MEQ/100ML IV SOLN
10.0000 meq | INTRAVENOUS | Status: AC
  Administered 2020-04-07 (×2): 10 meq via INTRAVENOUS
  Filled 2020-04-07 (×2): qty 100

## 2020-04-07 MED ORDER — POTASSIUM CHLORIDE 20 MEQ PO PACK
40.0000 meq | PACK | Freq: Once | ORAL | Status: AC
  Administered 2020-04-07: 40 meq via ORAL
  Filled 2020-04-07: qty 2

## 2020-04-07 MED ORDER — HYDRALAZINE HCL 20 MG/ML IJ SOLN: 10.0000 mg | Freq: Once | INTRAMUSCULAR | Status: DC

## 2020-04-07 MED ORDER — ALBUTEROL SULFATE HFA 108 (90 BASE) MCG/ACT IN AERS: 2.0000 | INHALATION_SPRAY | Freq: Four times a day (QID) | RESPIRATORY_TRACT | Status: DC | PRN

## 2020-04-07 MED ORDER — AMLODIPINE BESYLATE 5 MG PO TABS: 5.0000 mg | ORAL_TABLET | Freq: Every day | ORAL | 0 refills | Status: DC

## 2020-04-07 MED ORDER — HYDRALAZINE HCL 25 MG PO TABS
25.0000 mg | ORAL_TABLET | Freq: Once | ORAL | Status: AC
  Administered 2020-04-07: 25 mg via ORAL
  Filled 2020-04-07: qty 1

## 2020-04-07 MED ORDER — VENLAFAXINE HCL ER 75 MG PO CP24
75.0000 mg | ORAL_CAPSULE | Freq: Every day | ORAL | Status: DC
  Administered 2020-04-07: 75 mg via ORAL
  Filled 2020-04-07: qty 1

## 2020-04-07 MED ORDER — CEFDINIR 300 MG PO CAPS: 300.0000 mg | ORAL_CAPSULE | Freq: Two times a day (BID) | ORAL | 0 refills | Status: AC

## 2020-04-07 MED ORDER — ROSUVASTATIN CALCIUM 5 MG PO TABS: 5.0000 mg | ORAL_TABLET | Freq: Every day | ORAL | Status: DC

## 2020-04-07 MED ORDER — NORETHIN-ETH ESTRAD TRIPHASIC 0.5/0.75/1-35 MG-MCG PO TABS: 1.0000 | ORAL_TABLET | Freq: Every day | ORAL | Status: DC

## 2020-04-07 MED ORDER — IBUPROFEN 400 MG PO TABS: 800.0000 mg | ORAL_TABLET | Freq: Four times a day (QID) | ORAL | Status: DC | PRN

## 2020-04-07 NOTE — Progress Notes (Signed)
Pt 's  BP 167/84. Writer spoke to on call NP regarding discharge. Pt clear to go home. Pt educated on her new medication Norvasc and follow up with PCP. Pt shows understanding .

## 2020-04-07 NOTE — Discharge Summary (Addendum)
Physician Discharge Summary  Judy Meyer PPI:[id number] DOB: 21-Nov-1960 DOA: 04/06/2020  PCP: System, Pcp Not In  Admit date: 04/06/2020 Discharge date: 04/07/2020 Consultations: None Admitted From: Home Disposition: Home  Discharge Diagnoses:  Principal Problem:   Intractable nausea and vomiting Active Problems:   Gastroenteritis   Nephrolithiasis   Depression   Hypokalemia   Hypothyroidism   Hyperlipidemia   HTN (hypertension)   Hospital Course Summary: 59 y.o. female with medical history significant of depression, anxiety, hypothyroidism, hyperlipidemia who presented with sudden onset of abdominal pain mainly in the right flank to the right lower quadrant, intractable nausea and vomiting for the last 2 days.  Associated with some diarrhea over the last 1 week.  Denied any fever or chills denied any hematemesis no melena no bright red blood per rectum. She was seen and evaluated in the ER.  Symptomatic management initiated.  Her pain is localized to the right lower abdomen and is colicky in nature which resolved with pain medications. ED work-up: Patient's urinalysis and CT showed both leukourea as well as hematuria but normal CT finding except for a 3 mm right-sided nonobstructing renal stone. Patient admitted for symptomatic management and with suspicion for nephrolithiasis (possibly passed ureteric stone) or some kind of gastroenteritis .Admission labs also showed potassium of 2.7.  She has been admitted to the hospital for evaluation and treatment.  Hospital course:  #1 Intractable nausea vomiting and diarrhea: Possibly some type of gastroenteritis or possibly passed a kidney stone.  UA shows microscopic hematuria with RBC greater than 50, WBC 21-50, rare bacteria, nitrite/leukocyte esterase negative..  Urine culture not sent.  She was admitted with supportive management/ IV fluids.  Nausea vomiting resolved her diet was advanced to soft diet this afternoon.  She reports  improvement in abdominal pain but states has some pelvic/suprapubic fullness related to uterine fibroids and wants her home harmone medications to be resumed.  #2 hypokalemia: Potassium of 2.7.    Replaced IV and oral yesterday.  Potassium improved today but still low at 3.0 this morning.  Replaced further with IV/p.o. and potassium level improved to 4.0 prior to discharge.  No further nausea/vomiting or diarrhea this morning.   She feels ready to go home.  #3 Hypothyroidism: Continue with levothyroxine.  #4 Hyperlipidemia: On Crestor at home.  Hold for now.  #5  Hypertension: Patient's blood pressure has been elevated in systolic 063K to 160F while hospitalized.  Could be partly related to pain.  Will give 1 dose of clonidine prior to discharge.  Started on Norvasc 5 mg daily.  Should follow up with PCP for further adjustments or discontinuation if BP normalizes once out of hospital.  #6 Depression: Resume home regimen   #7 Uterine fibroids: Resume chronic hormonal therapy, she reports onset of uterine bleeding related to fibroids currently.  Abnormal UA could be related to this.  #8 Abnormal UA: Likely related to uterine bleeding from fibroids rather than true UTI.  However given presenting symptoms and suprapubic tenderness, will give empiric antibiotics for possible UTI.  Follow-up PCP for repeat UA and resolution of hematuria, if persistent may need urology evaluation as could be related to nephrolithiasis.   Discharge Exam:   Vitals:   04/07/20 1021 04/07/20 1328 04/07/20 1727 04/07/20 1819  BP: (!) 176/88 (!) 172/86 (!) 171/83 (!) 175/89  Pulse: 78 77 75 76  Resp: 15 16    Temp: 97.6 F (36.4 C) 97.9 F (36.6 C)    TempSrc:  SpO2: 100% 99% 100% 100%  Weight:      Height:        General: Pt is alert, awake, not in acute distress Cardiovascular: RRR, S1/S2 +, no rubs, no gallops Respiratory: CTA bilaterally, no wheezing, no rhonchi Abdominal: Soft, NT in the abdomen  has some pubi /suprapubic fullness and sensitivity, ND, bowel sounds + Extremities: no edema, no cyanosis  Discharge Condition:Stable CODE STATUS: Full code Diet recommendation: Low-salt low-fat Recommendations for Outpatient Follow-up:  1. Follow up with PCP: 4 to 5 days 2. Follow up with consultants: Urology as needed 3. Please obtain follow up labs including: BP check, BMP  Home Health services upon discharge: None Equipment/Devices upon discharge: None   Discharge Instructions:  Discharge Instructions    Call MD for:  difficulty breathing, headache or visual disturbances   Complete by: As directed    Call MD for:  extreme fatigue   Complete by: As directed    Call MD for:  persistant dizziness or light-headedness   Complete by: As directed    Call MD for:  persistant nausea and vomiting   Complete by: As directed    Call MD for:  severe uncontrolled pain   Complete by: As directed    Call MD for:  temperature >100.4   Complete by: As directed    Diet - low sodium heart healthy   Complete by: As directed    Increase activity slowly   Complete by: As directed      Allergies as of 04/07/2020      Reactions   Compazine [prochlorperazine Edisylate] Anaphylaxis   Periactin [cyproheptadine] Anaphylaxis   Cyclobenzaprine    Palpations    Haloperidol And Related    Reglan [metoclopramide]       Medication List    STOP taking these medications   methocarbamol 500 MG tablet Commonly known as: ROBAXIN     TAKE these medications   albuterol 108 (90 Base) MCG/ACT inhaler Commonly known as: VENTOLIN HFA Inhale 2 puffs into the lungs 4 (four) times daily as needed for wheezing or shortness of breath.   amLODipine 5 MG tablet Commonly known as: NORVASC Take 1 tablet (5 mg total) by mouth daily. Start taking on: April 08, 2020   cefdinir 300 MG capsule Commonly known as: OMNICEF Take 1 capsule (300 mg total) by mouth 2 (two) times daily for 3 days.    cetirizine-pseudoephedrine 5-120 MG tablet Commonly known as: ZYRTEC-D Take 1 tablet by mouth daily as needed for allergies.   Emgality 120 MG/ML Soaj Generic drug: Galcanezumab-gnlm Inject 120 mLs into the skin every 30 (thirty) days. November 2019   eszopiclone 1 MG Tabs tablet Commonly known as: LUNESTA Take 1 mg by mouth at bedtime as needed for sleep.   ibuprofen 200 MG tablet Commonly known as: ADVIL Take 800 mg by mouth every 6 (six) hours as needed for headache or mild pain.   levothyroxine 75 MCG tablet Commonly known as: SYNTHROID Take 75 mcg by mouth daily before breakfast.   norethindrone-ethinyl estradiol 0.5/0.75/1-35 MG-MCG tablet Commonly known as: CYCLAFEM Take 1 tablet by mouth daily.   rosuvastatin 5 MG tablet Commonly known as: CRESTOR Take 5 mg by mouth daily.   venlafaxine XR 75 MG 24 hr capsule Commonly known as: EFFEXOR-XR Take 75 mg by mouth daily.       Allergies  Allergen Reactions  . Compazine [Prochlorperazine Edisylate] Anaphylaxis  . Periactin [Cyproheptadine] Anaphylaxis  . Cyclobenzaprine     Palpations   .  Haloperidol And Related   . Reglan [Metoclopramide]       The results of significant diagnostics from this hospitalization (including imaging, microbiology, ancillary and laboratory) are listed below for reference.    Labs: BNP (last 3 results) No results for input(s): BNP in the last 8760 hours. Basic Metabolic Panel: Recent Labs  Lab 04/06/20 1428 04/07/20 0601 04/07/20 1549  NA 137 136  --   K 2.7* 3.0* 4.0  CL 101 107  --   CO2 18* 17*  --   GLUCOSE 98 120*  --   BUN 19 11  --   CREATININE 0.77 0.80  --   CALCIUM 8.6* 7.8*  --    Liver Function Tests: Recent Labs  Lab 04/06/20 1428 04/07/20 0601  AST 62* 54*  ALT 18 16  ALKPHOS 66 55  BILITOT 0.8 0.9  PROT 8.4* 6.7  ALBUMIN 4.1 3.3*   Recent Labs  Lab 04/06/20 1428  LIPASE 30   No results for input(s): AMMONIA in the last 168  hours. CBC: Recent Labs  Lab 04/06/20 1428 04/07/20 0601  WBC 6.1 6.4  HGB 14.7 12.2  HCT 43.5 38.2  MCV 97.3 101.9*  PLT 279 200   Cardiac Enzymes: No results for input(s): CKTOTAL, CKMB, CKMBINDEX, TROPONINI in the last 168 hours. BNP: Invalid input(s): POCBNP CBG: No results for input(s): GLUCAP in the last 168 hours. D-Dimer No results for input(s): DDIMER in the last 72 hours. Hgb A1c No results for input(s): HGBA1C in the last 72 hours. Lipid Profile No results for input(s): CHOL, HDL, LDLCALC, TRIG, CHOLHDL, LDLDIRECT in the last 72 hours. Thyroid function studies No results for input(s): TSH, T4TOTAL, T3FREE, THYROIDAB in the last 72 hours.  Invalid input(s): FREET3 Anemia work up No results for input(s): VITAMINB12, FOLATE, FERRITIN, TIBC, IRON, RETICCTPCT in the last 72 hours. Urinalysis    Component Value Date/Time   COLORURINE YELLOW 04/06/2020 1406   APPEARANCEUR HAZY (A) 04/06/2020 1406   LABSPEC 1.020 04/06/2020 1406   PHURINE 6.0 04/06/2020 1406   GLUCOSEU NEGATIVE 04/06/2020 1406   HGBUR LARGE (A) 04/06/2020 1406   BILIRUBINUR NEGATIVE 04/06/2020 1406   KETONESUR 20 (A) 04/06/2020 1406   PROTEINUR 100 (A) 04/06/2020 1406   NITRITE NEGATIVE 04/06/2020 1406   LEUKOCYTESUR NEGATIVE 04/06/2020 1406   Sepsis Labs Invalid input(s): PROCALCITONIN,  WBC,  LACTICIDVEN Microbiology Recent Results (from the past 240 hour(s))  SARS Coronavirus 2 by RT PCR (hospital order, performed in [facility] hospital lab) Nasopharyngeal Nasopharyngeal Swab     Status: None   Collection Time: 04/06/20  4:18 PM   Specimen: Nasopharyngeal Swab  Result Value Ref Range Status   SARS Coronavirus 2 NEGATIVE NEGATIVE Final    Comment: (NOTE) SARS-CoV-2 target nucleic acids are NOT DETECTED.  The SARS-CoV-2 RNA is generally detectable in upper and lower respiratory specimens during the acute phase of infection. The lowest concentration of SARS-CoV-2 viral copies this assay  can detect is 250 copies / mL. A negative result does not preclude SARS-CoV-2 infection and should not be used as the sole basis for treatment or other patient management decisions.  A negative result may occur with improper specimen collection / handling, submission of specimen other than nasopharyngeal swab, presence of viral mutation(s) within the areas targeted by this assay, and inadequate number of viral copies (<250 copies / mL). A negative result must be combined with clinical observations, patient history, and epidemiological information.  Fact Sheet for Patients:   StrictlyIdeas.no  Fact Sheet for Healthcare Providers: BankingDealers.co.za  This test is not yet approved or  cleared by the [place] FDA and has been authorized for detection and/or diagnosis of SARS-CoV-2 by FDA under an Emergency Use Authorization (EUA).  This EUA will remain in effect (meaning this test can be used) for the duration of the COVID-19 declaration under Section 564(b)(1) of the Act, 21 U.S.C. section 360bbb-3(b)(1), unless the authorization is terminated or revoked sooner.  Performed at [facility], [place] [address], [place], [place] [address]     Procedures/Studies: CT Abdomen Pelvis W Contrast  Result Date: 04/06/2020 CLINICAL DATA:  Abdominal pain, nausea and vomiting. EXAM: CT ABDOMEN AND PELVIS WITH CONTRAST TECHNIQUE: Multidetector CT imaging of the abdomen and pelvis was performed using the standard protocol following bolus administration of intravenous contrast. CONTRAST:  156mL OMNIPAQUE IOHEXOL 300 MG/ML  SOLN COMPARISON:  Report from CT abdomen pelvis dated 08/07/2001. FINDINGS: Lower chest: No acute abnormality. Hepatobiliary: A 7 mm cyst is seen in the right hepatic lobe. No gallstones, gallbladder wall thickening, or biliary dilatation. Pancreas: Unremarkable. No pancreatic ductal dilatation or surrounding  inflammatory changes. Spleen: Normal in size without focal abnormality. Adrenals/Urinary Tract: Adrenal glands are unremarkable. 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 A nonobstructing right renal calculus measures 3 mm. There is no hydronephrosis on the right. The left kidney appears normal, without renal calculi, focal lesion, or hydronephrosis. Bladder is unremarkable. Stomach/Bowel: Stomach is within normal limits. Appendix appears normal. No evidence of bowel wall thickening, distention, or inflammatory changes. Vascular/Lymphatic: Aortic atherosclerosis. No enlarged abdominal or pelvic lymph nodes. Reproductive: The uterus is enlarged with multiple fibroids. The adnexa appear normal. Other: No abdominal wall hernia or abnormality. No abdominopelvic ascites. Musculoskeletal: No acute or significant osseous findings. IMPRESSION: 1. No acute findings in the abdomen or pelvis. Aortic Atherosclerosis (ICD10-I70.0). Electronically Signed   By: Zerita Boers M.D.   On: 04/06/2020 17:16    Time coordinating discharge: Over 30 minutes  SIGNED:   Guilford Shi, MD  [facility] 04/07/2020, 6:31 PM

## 2020-04-24 ENCOUNTER — Ambulatory Visit (INDEPENDENT_AMBULATORY_CARE_PROVIDER_SITE_OTHER): Payer: Self-pay | Admitting: Obstetrics & Gynecology

## 2020-06-27 ENCOUNTER — Telehealth (HOSPITAL_BASED_OUTPATIENT_CLINIC_OR_DEPARTMENT_OTHER): Payer: Self-pay | Admitting: Plastic Surgery

## 2020-06-27 NOTE — Telephone Encounter (Signed)
I called this patient to check with her.  She had a Blepharplasty surgery scheduled some time ago and had already paid for it ($5000.00), however the surgery got cancelled and she has never been back to get it rescheduled.  That money is still sitting there on her account.  I called her to see if she wanted it refunded or if she may want to schedule to come back in.  No answer so I left her a message to call us.  Leslee Home  06/27/2020, 14:49

## 2020-07-17 ENCOUNTER — Ambulatory Visit (INDEPENDENT_AMBULATORY_CARE_PROVIDER_SITE_OTHER): Payer: Medicare (Managed Care) | Admitting: Obstetrics & Gynecology

## 2020-07-17 ENCOUNTER — Inpatient Hospital Stay (HOSPITAL_COMMUNITY)
Admission: EM | Admit: 2020-07-17 | Discharge: 2020-08-06 | DRG: 445 | Disposition: A | Discharge status: Home Health | Payer: Medicare PPO | Attending: Internal Medicine | Admitting: Internal Medicine

## 2020-07-17 ENCOUNTER — Emergency Department (HOSPITAL_COMMUNITY): Payer: Medicare PPO

## 2020-07-17 ENCOUNTER — Other Ambulatory Visit: Payer: Self-pay

## 2020-07-17 ENCOUNTER — Encounter (HOSPITAL_COMMUNITY): Payer: Self-pay

## 2020-07-17 DIAGNOSIS — D259 Leiomyoma of uterus, unspecified: Secondary | ICD-10-CM | POA: Diagnosis present

## 2020-07-17 DIAGNOSIS — S59201A Unspecified physeal fracture of lower end of radius, right arm, initial encounter for closed fracture: Secondary | ICD-10-CM | POA: Diagnosis not present

## 2020-07-17 DIAGNOSIS — M549 Dorsalgia, unspecified: Secondary | ICD-10-CM

## 2020-07-17 DIAGNOSIS — Z79890 Hormone replacement therapy (postmenopausal): Secondary | ICD-10-CM

## 2020-07-17 DIAGNOSIS — E440 Moderate protein-calorie malnutrition: Secondary | ICD-10-CM | POA: Insufficient documentation

## 2020-07-17 DIAGNOSIS — F32A Depression, unspecified: Secondary | ICD-10-CM | POA: Diagnosis present

## 2020-07-17 DIAGNOSIS — M62830 Muscle spasm of back: Secondary | ICD-10-CM | POA: Diagnosis not present

## 2020-07-17 DIAGNOSIS — T502X5A Adverse effect of carbonic-anhydrase inhibitors, benzothiadiazides and other diuretics, initial encounter: Secondary | ICD-10-CM | POA: Diagnosis not present

## 2020-07-17 DIAGNOSIS — E872 Acidosis, unspecified: Secondary | ICD-10-CM | POA: Diagnosis present

## 2020-07-17 DIAGNOSIS — M6282 Rhabdomyolysis: Secondary | ICD-10-CM

## 2020-07-17 DIAGNOSIS — Z8249 Family history of ischemic heart disease and other diseases of the circulatory system: Secondary | ICD-10-CM

## 2020-07-17 DIAGNOSIS — K7581 Nonalcoholic steatohepatitis (NASH): Secondary | ICD-10-CM | POA: Diagnosis present

## 2020-07-17 DIAGNOSIS — E860 Dehydration: Secondary | ICD-10-CM | POA: Diagnosis present

## 2020-07-17 DIAGNOSIS — E876 Hypokalemia: Secondary | ICD-10-CM | POA: Diagnosis present

## 2020-07-17 DIAGNOSIS — K8021 Calculus of gallbladder without cholecystitis with obstruction: Secondary | ICD-10-CM | POA: Diagnosis present

## 2020-07-17 DIAGNOSIS — K8309 Other cholangitis: Secondary | ICD-10-CM | POA: Diagnosis not present

## 2020-07-17 DIAGNOSIS — Z23 Encounter for immunization: Secondary | ICD-10-CM

## 2020-07-17 DIAGNOSIS — E785 Hyperlipidemia, unspecified: Secondary | ICD-10-CM | POA: Diagnosis present

## 2020-07-17 DIAGNOSIS — E8770 Fluid overload, unspecified: Secondary | ICD-10-CM | POA: Diagnosis present

## 2020-07-17 DIAGNOSIS — N200 Calculus of kidney: Secondary | ICD-10-CM | POA: Diagnosis present

## 2020-07-17 DIAGNOSIS — R197 Diarrhea, unspecified: Secondary | ICD-10-CM | POA: Diagnosis not present

## 2020-07-17 DIAGNOSIS — K831 Obstruction of bile duct: Secondary | ICD-10-CM

## 2020-07-17 DIAGNOSIS — M7989 Other specified soft tissue disorders: Secondary | ICD-10-CM

## 2020-07-17 DIAGNOSIS — R600 Localized edema: Secondary | ICD-10-CM | POA: Diagnosis present

## 2020-07-17 DIAGNOSIS — R7989 Other specified abnormal findings of blood chemistry: Secondary | ICD-10-CM

## 2020-07-17 DIAGNOSIS — K828 Other specified diseases of gallbladder: Secondary | ICD-10-CM | POA: Diagnosis present

## 2020-07-17 DIAGNOSIS — D539 Nutritional anemia, unspecified: Secondary | ICD-10-CM

## 2020-07-17 DIAGNOSIS — N938 Other specified abnormal uterine and vaginal bleeding: Secondary | ICD-10-CM | POA: Diagnosis present

## 2020-07-17 DIAGNOSIS — R17 Unspecified jaundice: Secondary | ICD-10-CM

## 2020-07-17 DIAGNOSIS — Z20822 Contact with and (suspected) exposure to covid-19: Secondary | ICD-10-CM | POA: Diagnosis present

## 2020-07-17 DIAGNOSIS — E538 Deficiency of other specified B group vitamins: Secondary | ICD-10-CM

## 2020-07-17 DIAGNOSIS — D1771 Benign lipomatous neoplasm of kidney: Secondary | ICD-10-CM | POA: Diagnosis present

## 2020-07-17 DIAGNOSIS — R7401 Elevation of levels of liver transaminase levels: Secondary | ICD-10-CM

## 2020-07-17 DIAGNOSIS — Z888 Allergy status to other drugs, medicaments and biological substances status: Secondary | ICD-10-CM

## 2020-07-17 DIAGNOSIS — E039 Hypothyroidism, unspecified: Secondary | ICD-10-CM | POA: Diagnosis present

## 2020-07-17 DIAGNOSIS — W010XXA Fall on same level from slipping, tripping and stumbling without subsequent striking against object, initial encounter: Secondary | ICD-10-CM | POA: Diagnosis not present

## 2020-07-17 DIAGNOSIS — T1490XA Injury, unspecified, initial encounter: Secondary | ICD-10-CM

## 2020-07-17 DIAGNOSIS — K5900 Constipation, unspecified: Secondary | ICD-10-CM | POA: Diagnosis not present

## 2020-07-17 DIAGNOSIS — M546 Pain in thoracic spine: Secondary | ICD-10-CM | POA: Diagnosis not present

## 2020-07-17 DIAGNOSIS — N390 Urinary tract infection, site not specified: Secondary | ICD-10-CM | POA: Diagnosis present

## 2020-07-17 DIAGNOSIS — Y92230 Patient room in hospital as the place of occurrence of the external cause: Secondary | ICD-10-CM | POA: Diagnosis not present

## 2020-07-17 DIAGNOSIS — Z6822 Body mass index (BMI) 22.0-22.9, adult: Secondary | ICD-10-CM

## 2020-07-17 DIAGNOSIS — Z79899 Other long term (current) drug therapy: Secondary | ICD-10-CM

## 2020-07-17 DIAGNOSIS — I10 Essential (primary) hypertension: Secondary | ICD-10-CM | POA: Diagnosis present

## 2020-07-17 DIAGNOSIS — R945 Abnormal results of liver function studies: Secondary | ICD-10-CM | POA: Insufficient documentation

## 2020-07-17 HISTORY — DX: Hypothyroidism, unspecified: E03.9

## 2020-07-17 HISTORY — DX: Essential (primary) hypertension: I10

## 2020-07-17 LAB — COMPREHENSIVE METABOLIC PANEL
ALT: 94 U/L — ABNORMAL HIGH (ref 0–44)
AST: <5 U/L — ABNORMAL LOW (ref 15–41)
Albumin: 3.3 g/dL — ABNORMAL LOW (ref 3.5–5.0)
Alkaline Phosphatase: 128 U/L — ABNORMAL HIGH (ref 38–126)
Anion gap: 23 — ABNORMAL HIGH (ref 5–15)
BUN: 13 mg/dL (ref 6–20)
CO2: 20 mmol/L — ABNORMAL LOW (ref 22–32)
Calcium: 8.5 mg/dL — ABNORMAL LOW (ref 8.9–10.3)
Chloride: 93 mmol/L — ABNORMAL LOW (ref 98–111)
Creatinine, Ser: 0.76 mg/dL (ref 0.44–1.00)
GFR, Estimated: >60 mL/min (ref >60)
Glucose, Bld: 137 mg/dL — ABNORMAL HIGH (ref 70–99)
Potassium: 3 mmol/L — ABNORMAL LOW (ref 3.5–5.1)
Sodium: 136 mmol/L (ref 135–145)
Total Bilirubin: 12.8 mg/dL — ABNORMAL HIGH (ref 0.3–1.2)
Total Protein: 7.1 g/dL (ref 6.5–8.1)

## 2020-07-17 LAB — CBC
HCT: 35.4 % — ABNORMAL LOW (ref 36.0–46.0)
Hemoglobin: 12 g/dL (ref 12.0–15.0)
MCH: 39.3 pg — ABNORMAL HIGH (ref 26.0–34.0)
MCHC: 33.9 g/dL (ref 30.0–36.0)
MCV: 116.1 fL — ABNORMAL HIGH (ref 80.0–100.0)
Platelets: 200 10*3/uL (ref 150–400)
RBC: 3.05 MIL/uL — ABNORMAL LOW (ref 3.87–5.11)
RDW: 18.9 % — ABNORMAL HIGH (ref 11.5–15.5)
WBC: 8 10*3/uL (ref 4.0–10.5)
nRBC: 0.6 % — ABNORMAL HIGH (ref 0.0–0.2)

## 2020-07-17 LAB — URINALYSIS, ROUTINE W REFLEX MICROSCOPIC: RBC / HPF: >50 RBC/hpf — ABNORMAL HIGH (ref 0–5)

## 2020-07-17 LAB — I-STAT BETA HCG BLOOD, ED (MC, WL, AP ONLY): I-stat hCG, quantitative: <5 m[IU]/mL (ref <5)

## 2020-07-17 LAB — LIPASE, BLOOD: Lipase: 53 U/L — ABNORMAL HIGH (ref 11–51)

## 2020-07-17 IMAGING — CT CT ABD-PELV W/ CM
2 of 5 series · 16 of 46 positions shown, 18 images · IV contrast (omnipaque)
Comparison: [DATE]

CLINICAL DATA: Abdominal pain and vomiting

EXAM:
CT ABDOMEN AND PELVIS WITH CONTRAST
TECHNIQUE: Multidetector CT imaging of the abdomen and pelvis was performed
using the standard protocol following bolus administration of
intravenous contrast.
CONTRAST:  100mL OMNIPAQUE IOHEXOL 300 MG/ML  SOLN

[Series 2: axial st · axial · 0.75mm/px · z∈[+1088,+1518]mm · 13 of 100 slices shown, 15 images]
[im 7/100  soft-tissue]
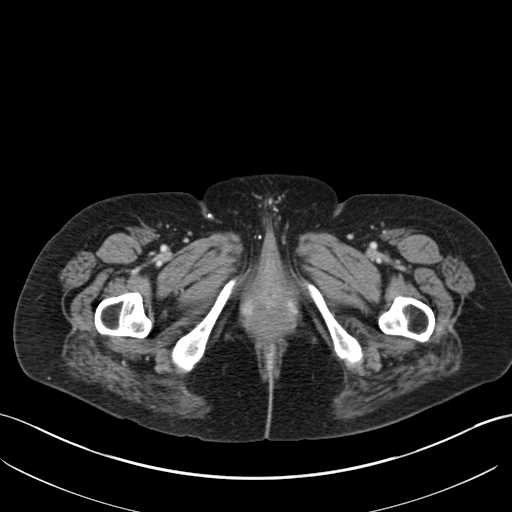
[im 7/100  bone]
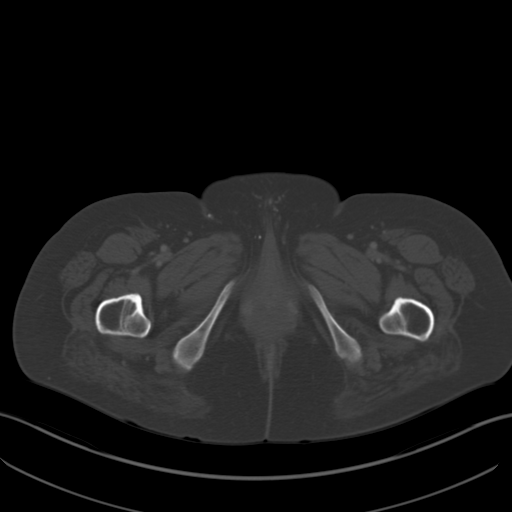
[im 14/100  soft-tissue]
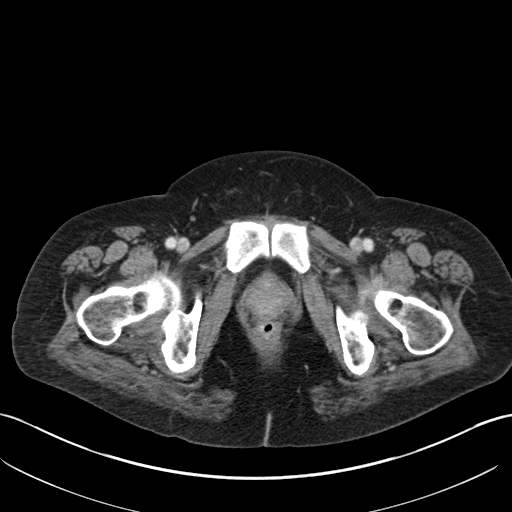
[im 20/100  soft-tissue]
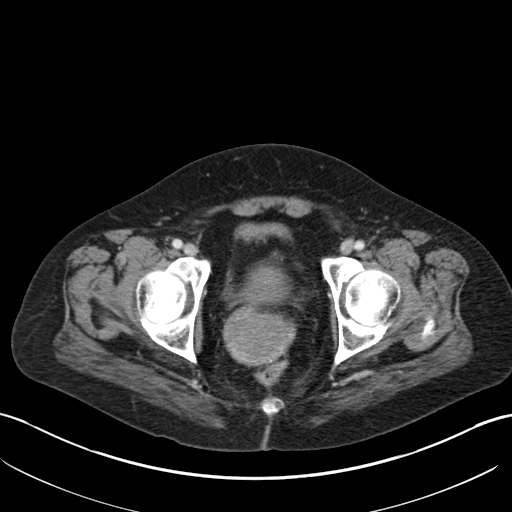
[im 27/100  soft-tissue]
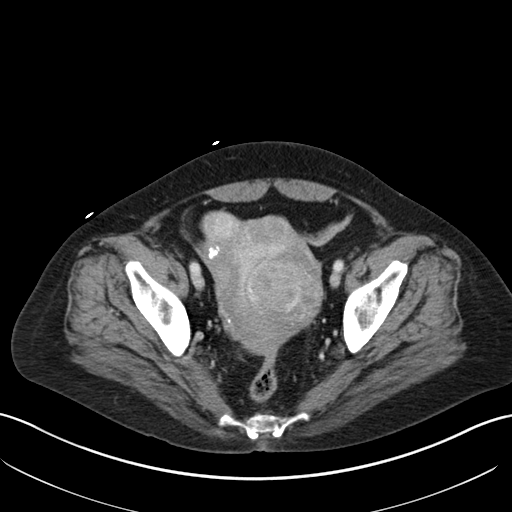
[im 34/100  soft-tissue]
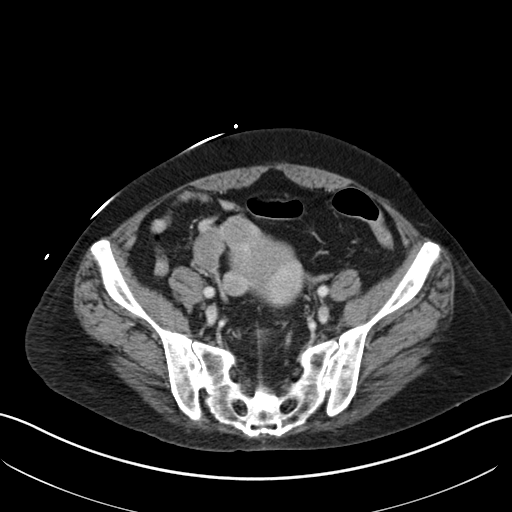
[im 40/100  soft-tissue]
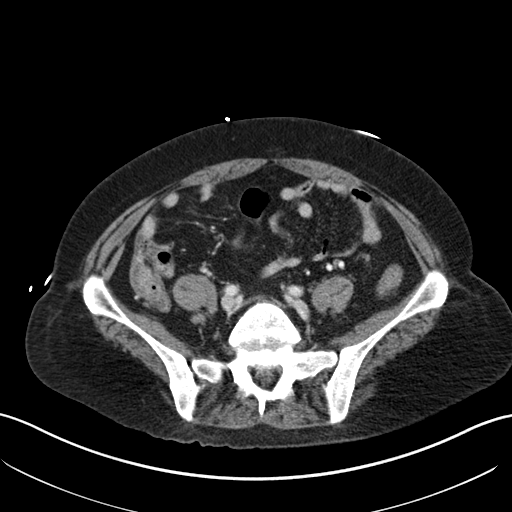
[im 53/100  soft-tissue]
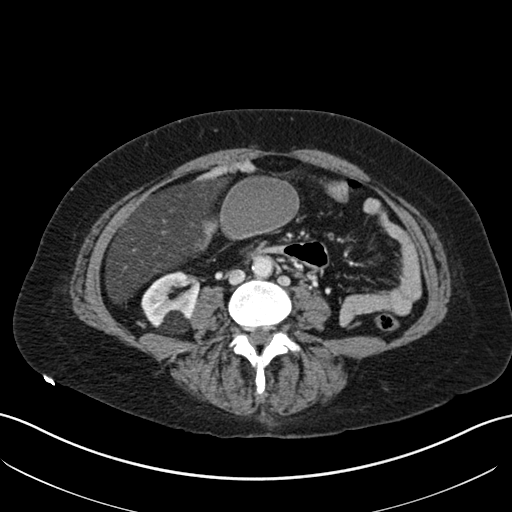
[im 60/100  soft-tissue]
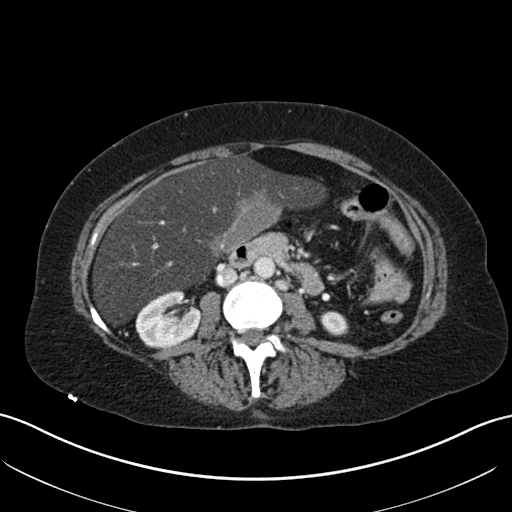
[im 67/100  soft-tissue]
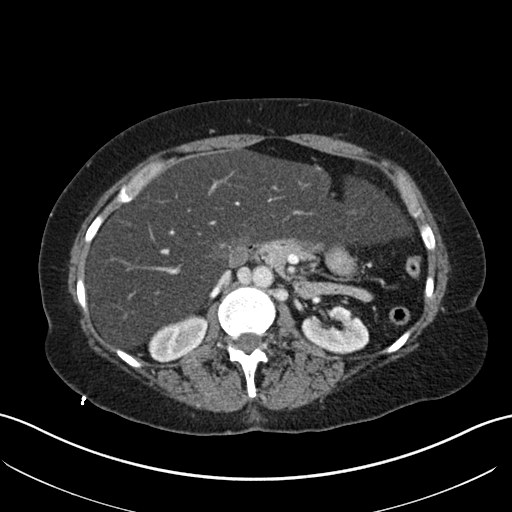
[im 67/100  bone]
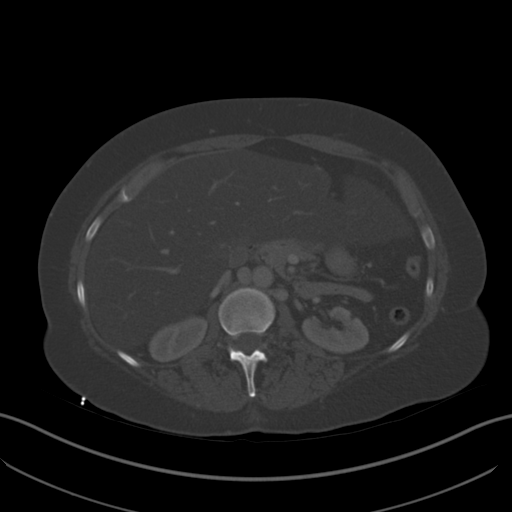
[im 73/100  soft-tissue]
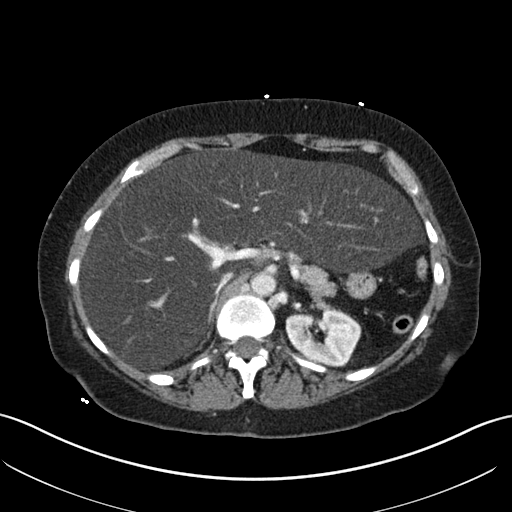
[im 80/100  soft-tissue]
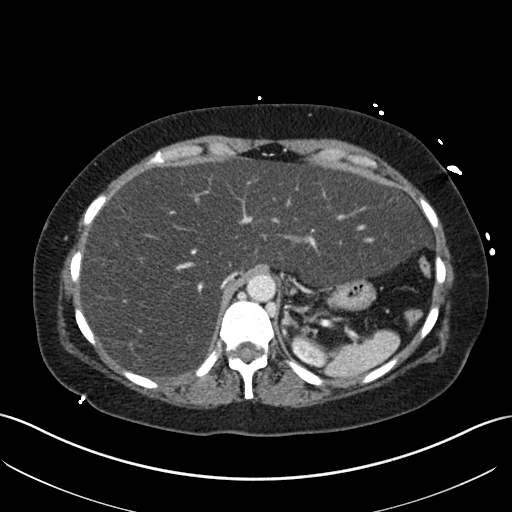
[im 86/100  soft-tissue]
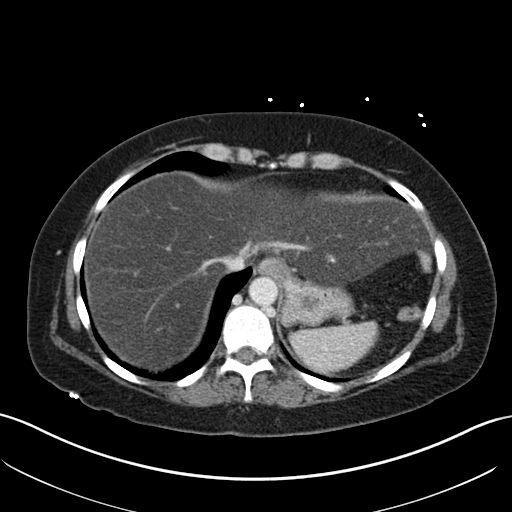
[im 93/100  soft-tissue]
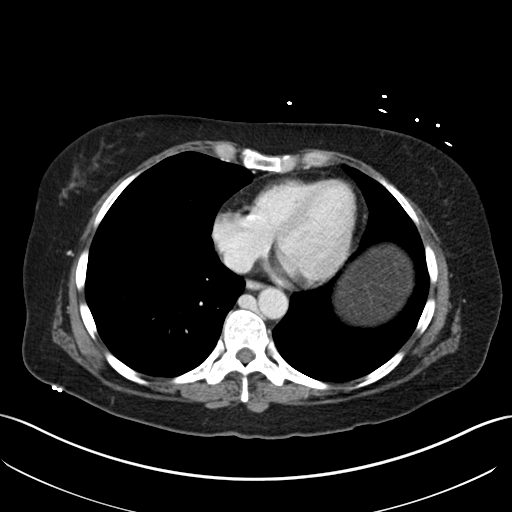

[Series 5: coronal st · coronal · 0.80mm/px · 3 of 138 slices shown]
[im 46/138  soft-tissue]
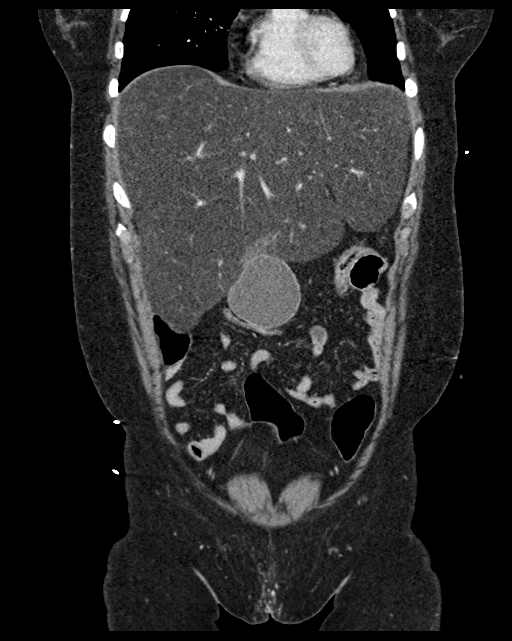
[im 61/138  soft-tissue]
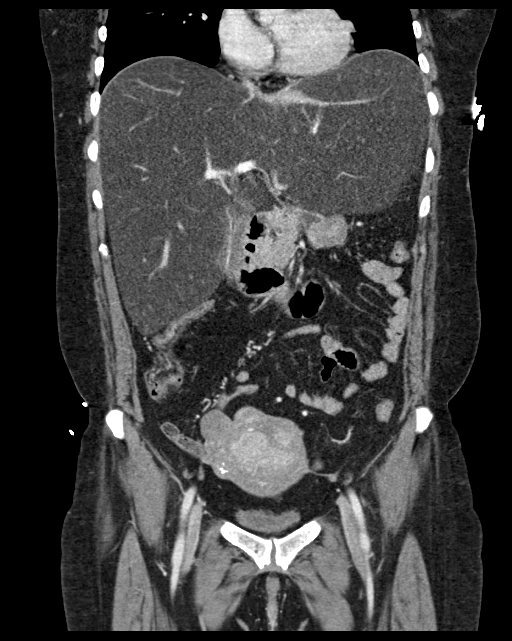
[im 77/138  soft-tissue]
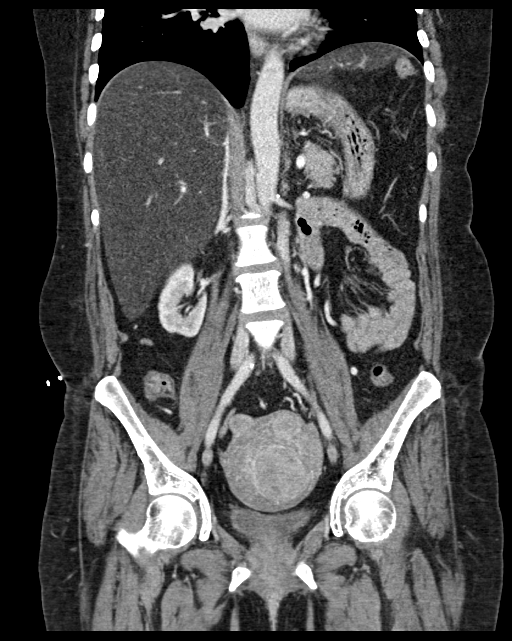

[16 of 46 positions shown; findings below may reference images not displayed]

FINDINGS: Lower chest: The visualized heart size within normal limits. No
pericardial fluid/thickening.

No hiatal hernia.

The visualized portions of the lungs are clear.

Hepatobiliary: There is diffuse low density seen throughout the
liver parenchyma with mild hepatomegaly.The main portal vein is
patent. The gallbladder is mildly fluid-filled and distended. No
intrahepatic biliary ductal dilatation is noted.

Pancreas: Unremarkable. No pancreatic ductal dilatation or
surrounding inflammatory changes.

Spleen: Normal in size without focal abnormality.

Adrenals/Urinary Tract: Both adrenal glands appear normal. Within
the lower pole of the right kidney again noted is a 2.1 cm fat
containing lesion, likely angiomyolipoma. There is a 3 mm calculus
in the lower pole of the right kidney. No hydronephrosis. The
bladder is partially decompressed.

Stomach/Bowel: The stomach, small bowel, and colon are normal in
appearance. No inflammatory changes, wall thickening, or obstructive
findings.The appendix is normal.

Vascular/Lymphatic: There are no enlarged mesenteric,
retroperitoneal, or pelvic lymph nodes. Scattered mild aortic
atherosclerosis is seen.

Reproductive: Multiple heterogeneously enhancing uterine fibroids
are again identified with the large uterus. Some of which appear to
be partially calcified.

Other: No evidence of abdominal wall mass or hernia.

Musculoskeletal: No acute or significant osseous findings.
IMPRESSION: Hepatic steatosis.

Mildly distended fluid-filled gallbladder, however no definite
evidence of surrounding inflammatory changes.

Stable 2 cm right-sided renal angiomyolipoma

Nonobstructing right renal calculus.

Enlarged uterus with multiple uterine fibroids.

Aortic Atherosclerosis ([J1]-[J1]).

## 2020-07-17 MED ORDER — DIPHENHYDRAMINE HCL 50 MG/ML IJ SOLN
25.0000 mg | Freq: Once | INTRAMUSCULAR | Status: AC
  Administered 2020-07-17: 25 mg via INTRAVENOUS
  Filled 2020-07-17: qty 1

## 2020-07-17 MED ORDER — KETOROLAC TROMETHAMINE 30 MG/ML IJ SOLN
30.0000 mg | Freq: Once | INTRAMUSCULAR | Status: AC
  Administered 2020-07-17: 30 mg via INTRAVENOUS
  Filled 2020-07-17: qty 1

## 2020-07-17 MED ORDER — ONDANSETRON HCL 4 MG/2ML IJ SOLN
4.0000 mg | Freq: Once | INTRAMUSCULAR | Status: AC
  Administered 2020-07-18: 4 mg via INTRAVENOUS
  Filled 2020-07-17: qty 2

## 2020-07-17 MED ORDER — MORPHINE SULFATE (PF) 4 MG/ML IV SOLN
4.0000 mg | Freq: Once | INTRAVENOUS | Status: AC
  Administered 2020-07-18: 4 mg via INTRAVENOUS
  Filled 2020-07-17: qty 1

## 2020-07-17 MED ORDER — ONDANSETRON HCL 4 MG/2ML IJ SOLN
4.0000 mg | Freq: Once | INTRAMUSCULAR | Status: AC
  Administered 2020-07-17: 4 mg via INTRAVENOUS
  Filled 2020-07-17: qty 2

## 2020-07-17 MED ORDER — SODIUM CHLORIDE 0.9 % IV BOLUS
1000.0000 mL | Freq: Once | INTRAVENOUS | Status: AC
  Administered 2020-07-17: 1000 mL via INTRAVENOUS

## 2020-07-17 MED ORDER — FAMOTIDINE IN NACL 20-0.9 MG/50ML-% IV SOLN
20.0000 mg | Freq: Once | INTRAVENOUS | Status: AC
  Administered 2020-07-17: 20 mg via INTRAVENOUS
  Filled 2020-07-17: qty 50

## 2020-07-17 MED ORDER — IOHEXOL 300 MG/ML  SOLN
100.0000 mL | Freq: Once | INTRAMUSCULAR | Status: AC | PRN
  Administered 2020-07-17: 100 mL via INTRAVENOUS

## 2020-07-17 NOTE — ED Triage Notes (Signed)
Pt reports vomiting for months and it is getting worse. Pt reports uterine fibroids that have been bleeding for several weeks. Pt weak and doesn't have much energy to get around.

## 2020-07-17 NOTE — ED Provider Notes (Signed)
[facility] DEPT Provider Note   CSN: [id number] Arrival date & time: 07/17/20  1948     History Chief Complaint  Patient presents with  . Vomiting  . Abdominal Pain    NOU CHARD is a 59 y.o. female.  HPI   59 year old female with past medical history of fibroids, HTN, HLD, depression, episode of intractable nausea/vomiting presents the emergency department with generalized abdominal pain and nausea/vomiting.  Patient states about 4 months ago she started with the nausea and vomiting.  She was evaluated in the hospital where she was found to be hypokalemic and intractable and admitted at that time.  She believes they mentioned a kidney stone however otherwise says she had no diagnosis, she was treated symptomatically and only improved for short amount of time.  For the last 3 months she has been experiencing nausea/vomiting, recently has become more severe and she is unable to hold down fluids for the past couple days.  She has not been able to tolerate any of her medications including her hormone therapy for fibroids, this is caused her to have some vaginal spotting.  Patient has had small normal bowel movements but has not had good p.o. intake.  She denies any fevers but admits to chills.  She feels very fatigued.  Denies any chest pain, cough, lower extremity swelling.  She saw a GI doctor as an outpatient but was not placed on any medications.  History reviewed. No pertinent past medical history.  Patient Active Problem List   Diagnosis Date Noted  . Gastroenteritis 04/07/2020  . Nephrolithiasis 04/07/2020  . HTN (hypertension) 04/07/2020  . Intractable nausea and vomiting 04/06/2020  . Depression 04/06/2020  . Hypokalemia 04/06/2020  . Hypothyroidism 04/06/2020  . Hyperlipidemia 04/06/2020    History reviewed. No pertinent surgical history.   OB History   No obstetric history on file.     History reviewed. No pertinent family  history.  Social History   Tobacco Use  . Smoking status: Never Smoker  . Smokeless tobacco: Never Used  Vaping Use  . Vaping Use: Never used  Substance Use Topics  . Alcohol use: Not Currently  . Drug use: Not Currently    Home Medications Prior to Admission medications   Medication Sig Start Date End Date Taking? Authorizing Provider  albuterol (VENTOLIN HFA) 108 (90 Base) MCG/ACT inhaler Inhale 2 puffs into the lungs 4 (four) times daily as needed for wheezing or shortness of breath. 12/20/19  Yes [provider]  amLODipine (NORVASC) 5 MG tablet Take 1 tablet (5 mg total) by mouth daily. 04/08/20 05/08/20 Yes Guilford Shi, MD  eszopiclone (LUNESTA) 1 MG TABS tablet Take 1 mg by mouth at bedtime as needed for sleep. 02/11/20  Yes [provider]  Galcanezumab-gnlm 120 MG/ML SOAJ Inject 120 mLs into the skin every 30 (thirty) days.   Yes [provider]  levothyroxine (SYNTHROID, LEVOTHROID) 75 MCG tablet Take 75 mcg by mouth daily before breakfast.   Yes [provider]  norethindrone-ethinyl estradiol (NORTREL 7/7/7) 0.5/0.75/1-35 MG-MCG tablet Take 1 tablet by mouth daily.   Yes [provider]  rosuvastatin (CRESTOR) 5 MG tablet Take 5 mg by mouth daily.   Yes [provider]  venlafaxine XR (EFFEXOR-XR) 75 MG 24 hr capsule Take 75 mg by mouth daily.   Yes [provider]    Allergies    Compazine [prochlorperazine edisylate], Periactin [cyproheptadine], Cyclobenzaprine, Haloperidol and related, Metoclopramide, Other, and Prochlorperazine  Review of Systems  Review of Systems  Constitutional: Positive for appetite change, chills and fatigue. Negative for fever.  HENT: Negative for congestion.   Eyes: Negative for visual disturbance.  Respiratory: Negative for shortness of breath.   Cardiovascular: Negative for chest pain.  Gastrointestinal: Positive for nausea and vomiting. Negative for abdominal pain, anal  bleeding, blood in stool and diarrhea.  Genitourinary: Positive for vaginal bleeding. Negative for dysuria, vaginal discharge and vaginal pain.  Skin: Negative for rash.  Neurological: Negative for headaches.    Physical Exam Updated Vital Signs BP (!) 158/83   Pulse 80   Temp 98.6 F (37 C) (Oral)   Resp 16   Ht 5\' 6"  (1.676 m)   Wt 63.5 kg   SpO2 99%   BMI 22.60 kg/m   Physical Exam Vitals and nursing note reviewed.  Constitutional:      Appearance: Normal appearance. She is ill-appearing.  HENT:     Head: Normocephalic.     Mouth/Throat:     Mouth: Mucous membranes are moist.  Cardiovascular:     Rate and Rhythm: Normal rate.  Pulmonary:     Effort: Pulmonary effort is normal. No respiratory distress.  Abdominal:     General: There is no distension.     Palpations: Abdomen is soft.     Tenderness: There is generalized abdominal tenderness. There is guarding.  Skin:    General: Skin is warm.     Coloration: Skin is jaundiced.  Neurological:     Mental Status: She is alert and oriented to person, place, and time. Mental status is at baseline.  Psychiatric:        Mood and Affect: Mood normal.     ED Results / Procedures / Treatments   Labs (all labs ordered are listed, but only abnormal results are displayed) Labs Reviewed  LIPASE, BLOOD - Abnormal; Notable for the following components:      Result Value   Lipase 53 (*)    All other components within normal limits  COMPREHENSIVE METABOLIC PANEL - Abnormal; Notable for the following components:   Potassium 3.0 (*)    Chloride 93 (*)    CO2 20 (*)    Glucose, Bld 137 (*)    Calcium 8.5 (*)    Albumin 3.3 (*)    AST <5 (*)    ALT 94 (*)    Alkaline Phosphatase 128 (*)    Total Bilirubin 12.8 (*)    Anion gap 23 (*)    All other components within normal limits  CBC - Abnormal; Notable for the following components:   RBC 3.05 (*)    HCT 35.4 (*)    MCV 116.1 (*)    MCH 39.3 (*)    RDW 18.9 (*)     nRBC 0.6 (*)    All other components within normal limits  URINALYSIS, ROUTINE W REFLEX MICROSCOPIC - Abnormal; Notable for the following components:   Color, Urine RED (*)    APPearance CLOUDY (*)    Glucose, UA   (*)    Value: TEST NOT REPORTED DUE TO COLOR INTERFERENCE OF URINE PIGMENT   Hgb urine dipstick   (*)    Value: TEST NOT REPORTED DUE TO COLOR INTERFERENCE OF URINE PIGMENT   Bilirubin Urine   (*)    Value: TEST NOT REPORTED DUE TO COLOR INTERFERENCE OF URINE PIGMENT   Ketones, ur   (*)    Value: TEST NOT REPORTED DUE TO COLOR INTERFERENCE OF URINE PIGMENT  Protein, ur   (*)    Value: TEST NOT REPORTED DUE TO COLOR INTERFERENCE OF URINE PIGMENT   Nitrite   (*)    Value: TEST NOT REPORTED DUE TO COLOR INTERFERENCE OF URINE PIGMENT   Leukocytes,Ua   (*)    Value: TEST NOT REPORTED DUE TO COLOR INTERFERENCE OF URINE PIGMENT   RBC / HPF >50 (*)    Bacteria, UA MANY (*)    All other components within normal limits  I-STAT BETA HCG BLOOD, ED ([facility], [facility], [facility] ONLY)  I-STAT BETA HCG BLOOD, ED ([facility], [facility], [facility] ONLY)    EKG None  Radiology No results found.  Procedures Procedures (including critical care time)  Medications Ordered in ED Medications  sodium chloride 0.9 % bolus 1,000 mL (1,000 mLs Intravenous New Bag/Given 07/17/20 2236)  ketorolac (TORADOL) 30 MG/ML injection 30 mg (30 mg Intravenous Given 07/17/20 2155)  famotidine (PEPCID) IVPB 20 mg premix (0 mg Intravenous Stopped 07/17/20 2238)  ondansetron (ZOFRAN) injection 4 mg (4 mg Intravenous Given 07/17/20 2153)  diphenhydrAMINE (BENADRYL) injection 25 mg (25 mg Intravenous Given 07/17/20 2151)  iohexol (OMNIPAQUE) 300 MG/ML solution 100 mL (100 mLs Intravenous Contrast Given 07/17/20 2221)    ED Course  I have reviewed the triage vital signs and the nursing notes.  Pertinent labs & imaging results that were available during my care of the patient were reviewed by me and considered in my medical decision making (see  chart for details).  Clinical Course as of 07/17/20 2259  Thu Jul 17, 2020  2257 Patient was tachycardic on arrival but with a regular rate on my evaluation.  The abdomen is diffusely tender with inconsistent guarding, most tenderness was in the right side of the abdomen.  Patient appears jaundice, weak and tremulous.  Blood work does not show a significant leukocytosis, lipase is slightly elevated, alkaline phosphatase and ALT are elevated and her bilirubin is over 12 which was originally normal a couple weeks ago.  She has hypokalemia, appears to be at baseline from when she was discharged.  Urinalysis was difficult to test, I saw the sample, it was frank hematuria, there is noted bacteria but no white blood cells.  Did a CAT scan of the abdomen pelvis which identifies a fluid-filled distended gallbladder and nephrolithiasis but no other acute findings. [KH]    Clinical Course User Index [KH] Azaylea Maves, Alvin Critchley, DO   MDM Rules/Calculators/A&P                          Patient is jaundice and ill appearing, tremulous and very weak. Blood work shows elevated lipase, LFTs and a bilirubin >12. CT shows distended GB without stones or findings of acute cholecystitis. UA shows bacteria but otherwise is too bloody for testing. She has had hematuria before, she is also having vaginal spotting from fibroids which is baseline and worse currently since she hasnt been able to tolerate PO meds/hormone therapy. After a round of medications patient is only mildly improved. More medication has been ordered. Patient will require admission, consult evaluation and further treatment. Final Clinical Impression(s) / ED Diagnoses Final diagnoses:  None    Rx / DC Orders ED Discharge Orders    None       Lorelle Gibbs, DO 07/18/20 0012

## 2020-07-18 ENCOUNTER — Observation Stay (HOSPITAL_COMMUNITY): Payer: Medicare PPO

## 2020-07-18 DIAGNOSIS — N390 Urinary tract infection, site not specified: Secondary | ICD-10-CM

## 2020-07-18 DIAGNOSIS — R7989 Other specified abnormal findings of blood chemistry: Secondary | ICD-10-CM

## 2020-07-18 DIAGNOSIS — E440 Moderate protein-calorie malnutrition: Secondary | ICD-10-CM | POA: Diagnosis present

## 2020-07-18 DIAGNOSIS — E538 Deficiency of other specified B group vitamins: Secondary | ICD-10-CM | POA: Diagnosis present

## 2020-07-18 DIAGNOSIS — R17 Unspecified jaundice: Secondary | ICD-10-CM | POA: Diagnosis not present

## 2020-07-18 DIAGNOSIS — M6282 Rhabdomyolysis: Secondary | ICD-10-CM | POA: Diagnosis present

## 2020-07-18 DIAGNOSIS — E785 Hyperlipidemia, unspecified: Secondary | ICD-10-CM

## 2020-07-18 DIAGNOSIS — I10 Essential (primary) hypertension: Secondary | ICD-10-CM | POA: Diagnosis present

## 2020-07-18 DIAGNOSIS — R945 Abnormal results of liver function studies: Secondary | ICD-10-CM | POA: Diagnosis not present

## 2020-07-18 DIAGNOSIS — E872 Acidosis, unspecified: Secondary | ICD-10-CM | POA: Diagnosis present

## 2020-07-18 DIAGNOSIS — E039 Hypothyroidism, unspecified: Secondary | ICD-10-CM | POA: Diagnosis present

## 2020-07-18 DIAGNOSIS — R600 Localized edema: Secondary | ICD-10-CM | POA: Diagnosis present

## 2020-07-18 DIAGNOSIS — W010XXA Fall on same level from slipping, tripping and stumbling without subsequent striking against object, initial encounter: Secondary | ICD-10-CM | POA: Diagnosis not present

## 2020-07-18 DIAGNOSIS — K8021 Calculus of gallbladder without cholecystitis with obstruction: Secondary | ICD-10-CM | POA: Diagnosis present

## 2020-07-18 DIAGNOSIS — S59201A Unspecified physeal fracture of lower end of radius, right arm, initial encounter for closed fracture: Secondary | ICD-10-CM | POA: Diagnosis not present

## 2020-07-18 DIAGNOSIS — Z20822 Contact with and (suspected) exposure to covid-19: Secondary | ICD-10-CM | POA: Diagnosis present

## 2020-07-18 DIAGNOSIS — E860 Dehydration: Secondary | ICD-10-CM

## 2020-07-18 DIAGNOSIS — D539 Nutritional anemia, unspecified: Secondary | ICD-10-CM | POA: Diagnosis present

## 2020-07-18 DIAGNOSIS — Z23 Encounter for immunization: Secondary | ICD-10-CM | POA: Diagnosis present

## 2020-07-18 DIAGNOSIS — K828 Other specified diseases of gallbladder: Secondary | ICD-10-CM | POA: Diagnosis present

## 2020-07-18 DIAGNOSIS — F32A Depression, unspecified: Secondary | ICD-10-CM | POA: Diagnosis present

## 2020-07-18 DIAGNOSIS — K8309 Other cholangitis: Secondary | ICD-10-CM | POA: Diagnosis present

## 2020-07-18 DIAGNOSIS — E8770 Fluid overload, unspecified: Secondary | ICD-10-CM | POA: Diagnosis present

## 2020-07-18 DIAGNOSIS — M546 Pain in thoracic spine: Secondary | ICD-10-CM | POA: Diagnosis not present

## 2020-07-18 DIAGNOSIS — K7581 Nonalcoholic steatohepatitis (NASH): Secondary | ICD-10-CM | POA: Diagnosis present

## 2020-07-18 DIAGNOSIS — D259 Leiomyoma of uterus, unspecified: Secondary | ICD-10-CM | POA: Diagnosis present

## 2020-07-18 DIAGNOSIS — R112 Nausea with vomiting, unspecified: Secondary | ICD-10-CM

## 2020-07-18 DIAGNOSIS — E876 Hypokalemia: Secondary | ICD-10-CM

## 2020-07-18 DIAGNOSIS — Y92230 Patient room in hospital as the place of occurrence of the external cause: Secondary | ICD-10-CM | POA: Diagnosis not present

## 2020-07-18 DIAGNOSIS — N938 Other specified abnormal uterine and vaginal bleeding: Secondary | ICD-10-CM | POA: Diagnosis present

## 2020-07-18 DIAGNOSIS — N200 Calculus of kidney: Secondary | ICD-10-CM | POA: Diagnosis present

## 2020-07-18 DIAGNOSIS — K831 Obstruction of bile duct: Secondary | ICD-10-CM

## 2020-07-18 LAB — CBC WITH DIFFERENTIAL/PLATELET
Abs Immature Granulocytes: 0.04 10*3/uL (ref 0.00–0.07)
Abs Immature Granulocytes: 0.03 10*3/uL (ref 0.00–0.07)
Basophils Absolute: 0 10*3/uL (ref 0.0–0.1)
Basophils Absolute: 0 10*3/uL (ref 0.0–0.1)
Basophils Relative: 0 %
Basophils Relative: 0 %
Eosinophils Absolute: 0 10*3/uL (ref 0.0–0.5)
Eosinophils Absolute: 0 10*3/uL (ref 0.0–0.5)
Eosinophils Relative: 0 %
Eosinophils Relative: 0 %
HCT: 28.6 % — ABNORMAL LOW (ref 36.0–46.0)
HCT: 29 % — ABNORMAL LOW (ref 36.0–46.0)
Hemoglobin: 10 g/dL — ABNORMAL LOW (ref 12.0–15.0)
Hemoglobin: 9.7 g/dL — ABNORMAL LOW (ref 12.0–15.0)
Immature Granulocytes: 1 %
Immature Granulocytes: 0 %
Lymphocytes Relative: 16 %
Lymphocytes Relative: 19 %
Lymphs Abs: 1.2 10*3/uL (ref 0.7–4.0)
Lymphs Abs: 1.4 10*3/uL (ref 0.7–4.0)
MCH: 40 pg — ABNORMAL HIGH (ref 26.0–34.0)
MCH: 39.4 pg — ABNORMAL HIGH (ref 26.0–34.0)
MCHC: 35 g/dL (ref 30.0–36.0)
MCHC: 33.4 g/dL (ref 30.0–36.0)
MCV: 114.4 fL — ABNORMAL HIGH (ref 80.0–100.0)
MCV: 117.9 fL — ABNORMAL HIGH (ref 80.0–100.0)
Monocytes Absolute: 0.6 10*3/uL (ref 0.1–1.0)
Monocytes Absolute: 0.5 10*3/uL (ref 0.1–1.0)
Monocytes Relative: 7 %
Monocytes Relative: 7 %
Neutro Abs: 5.9 10*3/uL (ref 1.7–7.7)
Neutro Abs: 5.4 10*3/uL (ref 1.7–7.7)
Neutrophils Relative %: 76 %
Neutrophils Relative %: 74 %
Platelets: 169 10*3/uL (ref 150–400)
Platelets: 155 10*3/uL (ref 150–400)
RBC: 2.5 MIL/uL — ABNORMAL LOW (ref 3.87–5.11)
RBC: 2.46 MIL/uL — ABNORMAL LOW (ref 3.87–5.11)
RDW: 18.8 % — ABNORMAL HIGH (ref 11.5–15.5)
RDW: 19.6 % — ABNORMAL HIGH (ref 11.5–15.5)
WBC: 7.8 10*3/uL (ref 4.0–10.5)
WBC: 7.4 10*3/uL (ref 4.0–10.5)
nRBC: 0.4 % — ABNORMAL HIGH (ref 0.0–0.2)
nRBC: 0.4 % — ABNORMAL HIGH (ref 0.0–0.2)

## 2020-07-18 LAB — IRON AND TIBC
Iron: 191 ug/dL — ABNORMAL HIGH (ref 28–170)
Saturation Ratios: 88 % — ABNORMAL HIGH (ref 10.4–31.8)
TIBC: 216 ug/dL — ABNORMAL LOW (ref 250–450)
UIBC: 25 ug/dL

## 2020-07-18 LAB — PROTIME-INR
INR: 1.3 — ABNORMAL HIGH (ref 0.8–1.2)
INR: 1.3 — ABNORMAL HIGH (ref 0.8–1.2)
Prothrombin Time: 15.4 seconds — ABNORMAL HIGH (ref 11.4–15.2)
Prothrombin Time: 15.7 seconds — ABNORMAL HIGH (ref 11.4–15.2)

## 2020-07-18 LAB — COMPREHENSIVE METABOLIC PANEL
ALT: 73 U/L — ABNORMAL HIGH (ref 0–44)
AST: 217 U/L — ABNORMAL HIGH (ref 15–41)
Albumin: 2.7 g/dL — ABNORMAL LOW (ref 3.5–5.0)
Alkaline Phosphatase: 95 U/L (ref 38–126)
Anion gap: 16 — ABNORMAL HIGH (ref 5–15)
BUN: 11 mg/dL (ref 6–20)
CO2: 23 mmol/L (ref 22–32)
Calcium: 7.6 mg/dL — ABNORMAL LOW (ref 8.9–10.3)
Chloride: 98 mmol/L (ref 98–111)
Creatinine, Ser: 0.69 mg/dL (ref 0.44–1.00)
GFR, Estimated: >60 mL/min (ref >60)
Glucose, Bld: 101 mg/dL — ABNORMAL HIGH (ref 70–99)
Potassium: 3.2 mmol/L — ABNORMAL LOW (ref 3.5–5.1)
Sodium: 137 mmol/L (ref 135–145)
Total Bilirubin: 10.7 mg/dL — ABNORMAL HIGH (ref 0.3–1.2)
Total Protein: 5.6 g/dL — ABNORMAL LOW (ref 6.5–8.1)

## 2020-07-18 LAB — ETHANOL: Alcohol, Ethyl (B): <10 mg/dL (ref <10)

## 2020-07-18 LAB — LACTIC ACID, PLASMA
Lactic Acid, Venous: 4.2 mmol/L (ref 0.5–1.9)
Lactic Acid, Venous: 2.6 mmol/L (ref 0.5–1.9)
Lactic Acid, Venous: 2.5 mmol/L (ref 0.5–1.9)

## 2020-07-18 LAB — VITAMIN B12: Vitamin B-12: 870 pg/mL (ref 180–914)

## 2020-07-18 LAB — BLOOD GAS, VENOUS
Acid-Base Excess: 3.9 mmol/L — ABNORMAL HIGH (ref 0.0–2.0)
Bicarbonate: 27.2 mmol/L (ref 20.0–28.0)
O2 Saturation: 96.4 %
Patient temperature: 37
pCO2, Ven: 38.1 mmHg — ABNORMAL LOW (ref 44.0–60.0)
pH, Ven: 7.468 — ABNORMAL HIGH (ref 7.250–7.430)
pO2, Ven: 88.6 mmHg — ABNORMAL HIGH (ref 32.0–45.0)

## 2020-07-18 LAB — RESP PANEL BY RT-PCR (FLU A&B, COVID) ARPGX2
Influenza A by PCR: NEGATIVE
Influenza B by PCR: NEGATIVE
SARS Coronavirus 2 by RT PCR: NEGATIVE

## 2020-07-18 LAB — HEPATITIS PANEL, ACUTE
HCV Ab: NONREACTIVE
Hep A IgM: NONREACTIVE
Hep B C IgM: NONREACTIVE
Hepatitis B Surface Ag: NONREACTIVE

## 2020-07-18 LAB — LIPID PANEL
Cholesterol: 139 mg/dL (ref 0–200)
HDL: 5 mg/dL — ABNORMAL LOW (ref >40)
LDL Cholesterol: 88 mg/dL (ref 0–99)
Total CHOL/HDL Ratio: 27.8 RATIO
Triglycerides: 228 mg/dL — ABNORMAL HIGH (ref <150)
VLDL: 46 mg/dL — ABNORMAL HIGH (ref 0–40)

## 2020-07-18 LAB — RAPID URINE DRUG SCREEN, HOSP PERFORMED
Amphetamines: NOT DETECTED
Barbiturates: NOT DETECTED
Benzodiazepines: NOT DETECTED
Cocaine: NOT DETECTED
Opiates: NOT DETECTED
Tetrahydrocannabinol: NOT DETECTED

## 2020-07-18 LAB — FERRITIN: Ferritin: 802 ng/mL — ABNORMAL HIGH (ref 11–307)

## 2020-07-18 LAB — T4, FREE: Free T4: 0.97 ng/dL (ref 0.61–1.12)

## 2020-07-18 LAB — AMMONIA: Ammonia: 44 umol/L — ABNORMAL HIGH (ref 9–35)

## 2020-07-18 LAB — TSH
TSH: 14.67 u[IU]/mL — ABNORMAL HIGH (ref 0.350–4.500)
TSH: 24.526 u[IU]/mL — ABNORMAL HIGH (ref 0.350–4.500)

## 2020-07-18 LAB — HEMOGLOBIN A1C
Hgb A1c MFr Bld: 5.1 % (ref 4.8–5.6)
Mean Plasma Glucose: 99.67 mg/dL

## 2020-07-18 LAB — BILIRUBIN, FRACTIONATED(TOT/DIR/INDIR)
Bilirubin, Direct: 5.9 mg/dL — ABNORMAL HIGH (ref 0.0–0.2)
Indirect Bilirubin: 5.2 mg/dL — ABNORMAL HIGH (ref 0.3–0.9)
Total Bilirubin: 11.1 mg/dL — ABNORMAL HIGH (ref 0.3–1.2)

## 2020-07-18 LAB — LACTATE DEHYDROGENASE: LDH: 687 U/L — ABNORMAL HIGH (ref 98–192)

## 2020-07-18 LAB — PREALBUMIN: Prealbumin: 22.7 mg/dL (ref 18–38)

## 2020-07-18 LAB — FOLATE: Folate: 2.7 ng/mL — ABNORMAL LOW (ref >5.9)

## 2020-07-18 LAB — PHOSPHORUS
Phosphorus: 2 mg/dL — ABNORMAL LOW (ref 2.5–4.6)
Phosphorus: 2 mg/dL — ABNORMAL LOW (ref 2.5–4.6)

## 2020-07-18 LAB — FIBRINOGEN: Fibrinogen: 272 mg/dL (ref 210–475)

## 2020-07-18 LAB — MAGNESIUM
Magnesium: 1.5 mg/dL — ABNORMAL LOW (ref 1.7–2.4)
Magnesium: 1.5 mg/dL — ABNORMAL LOW (ref 1.7–2.4)

## 2020-07-18 LAB — CK: Total CK: 5734 U/L — ABNORMAL HIGH (ref 38–234)

## 2020-07-18 LAB — ACETAMINOPHEN LEVEL: Acetaminophen (Tylenol), Serum: <10 ug/mL — ABNORMAL LOW (ref 10–30)

## 2020-07-18 IMAGING — US US ABDOMEN LIMITED
1 series · 14 of 25 positions shown · non-contrast
Comparison: CT prior day

CLINICAL DATA: Abdominal pain increased LFTs

EXAM:
ULTRASOUND ABDOMEN LIMITED RIGHT UPPER QUADRANT

[Series 1: us abdomen limited · 14 of 69 slices shown]
[im 1/69]
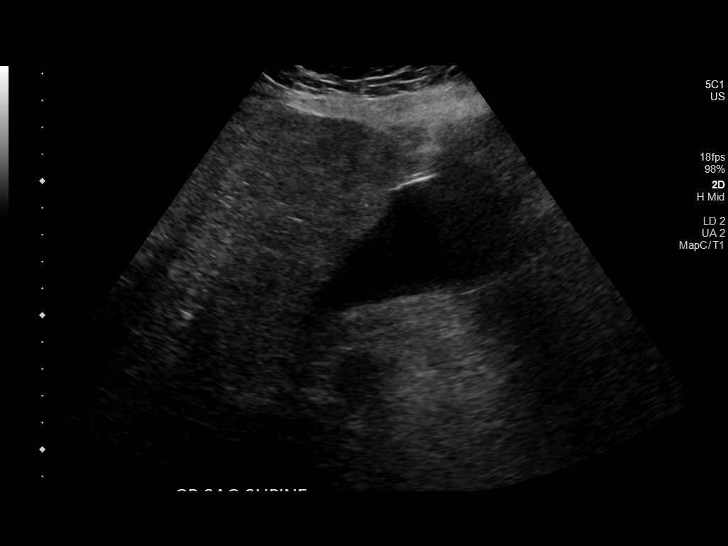
[im 6/69]
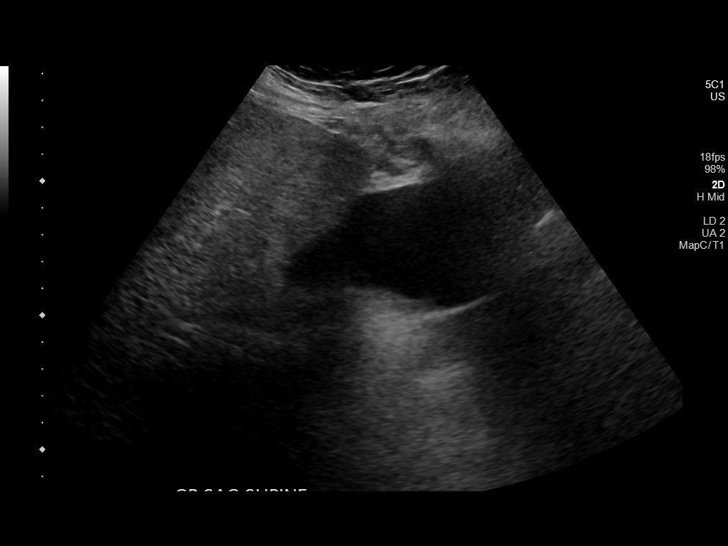
[im 12/69]
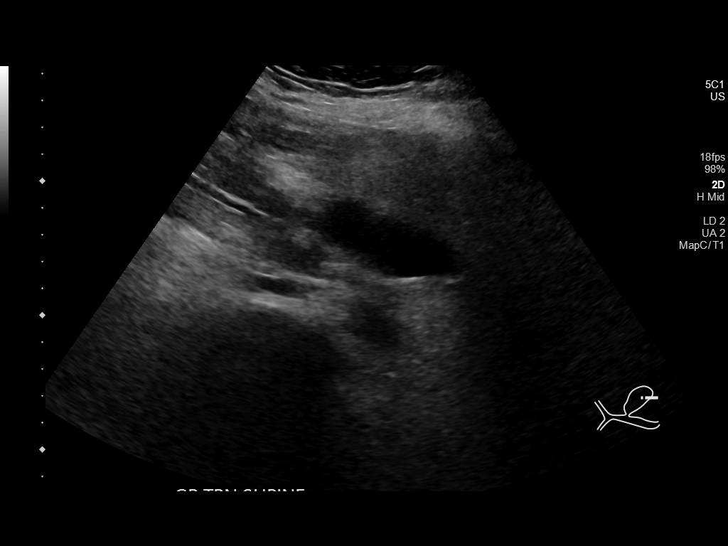
[im 18/69]
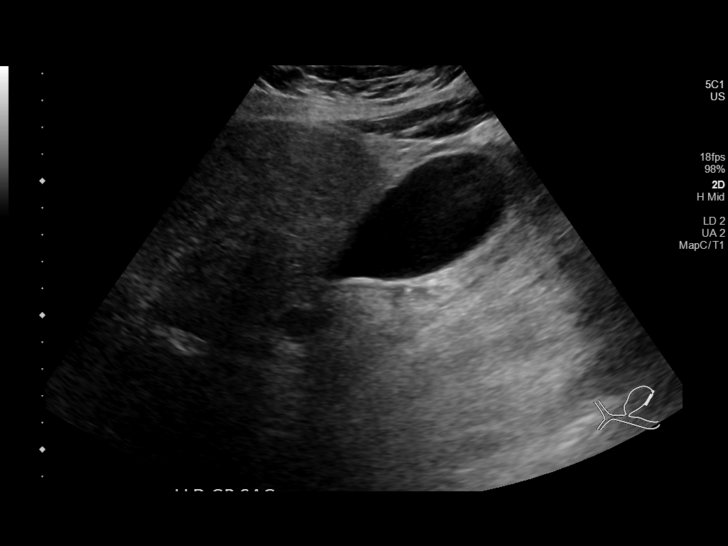
[im 23/69]
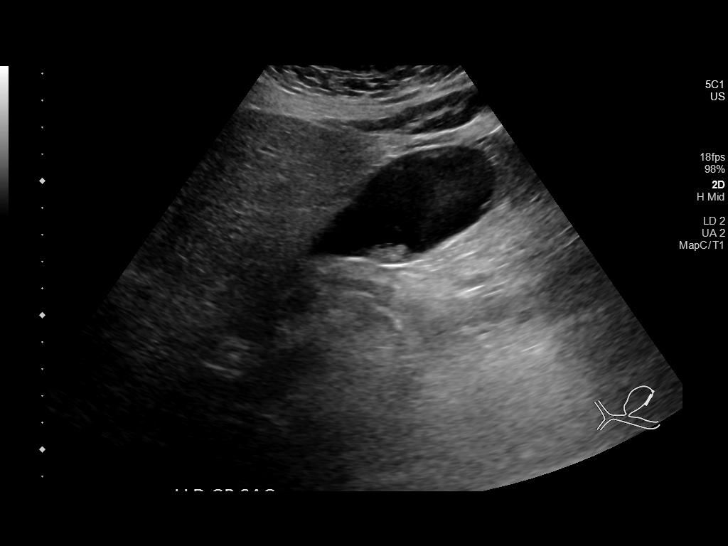
[im 26/69]
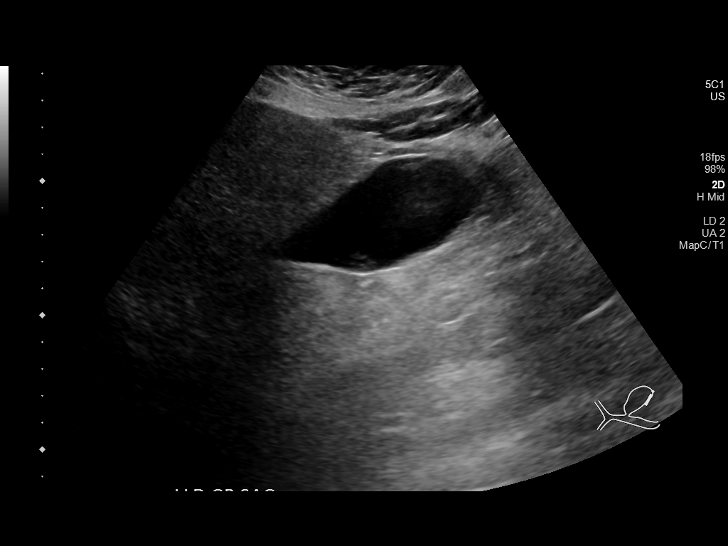
[im 32/69]
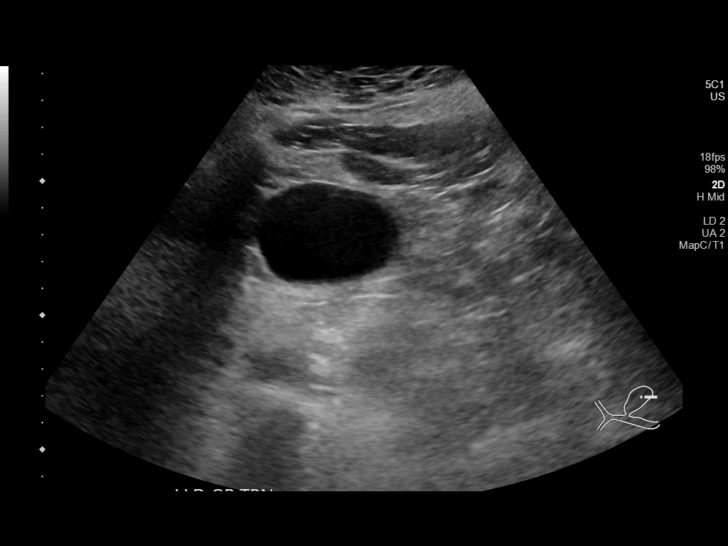
[im 37/69]
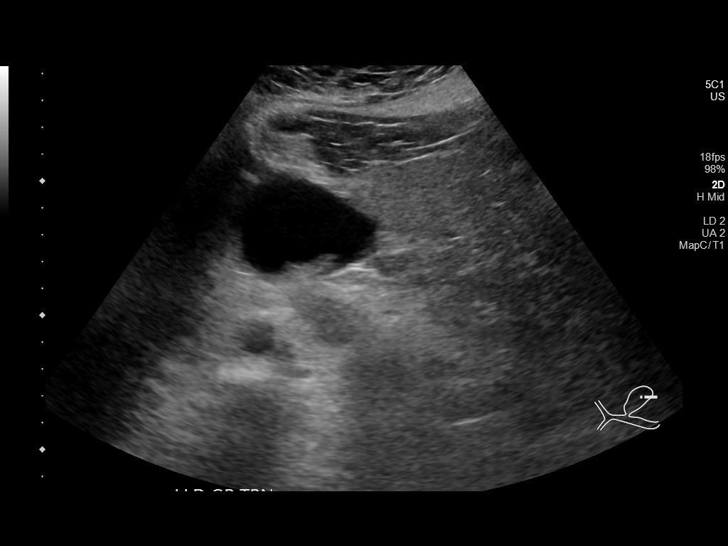
[im 43/69]
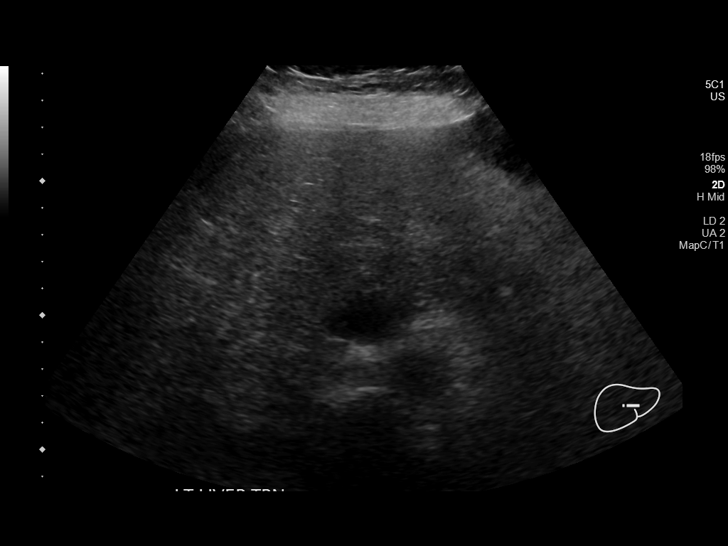
[im 46/69]
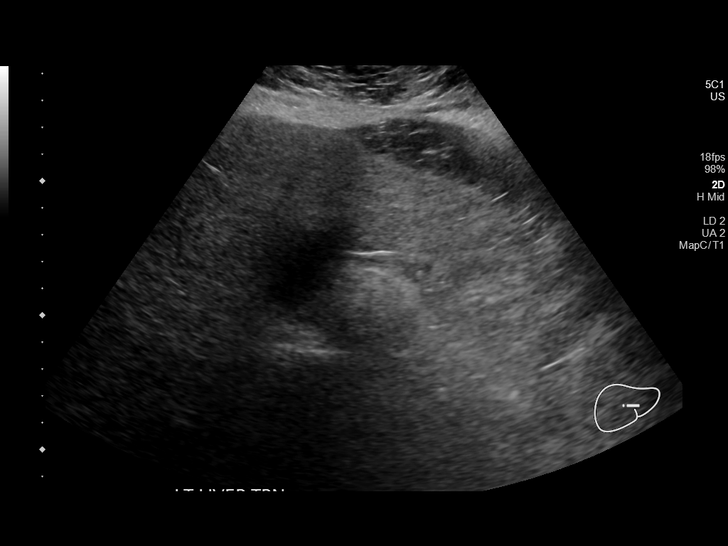
[im 52/69]
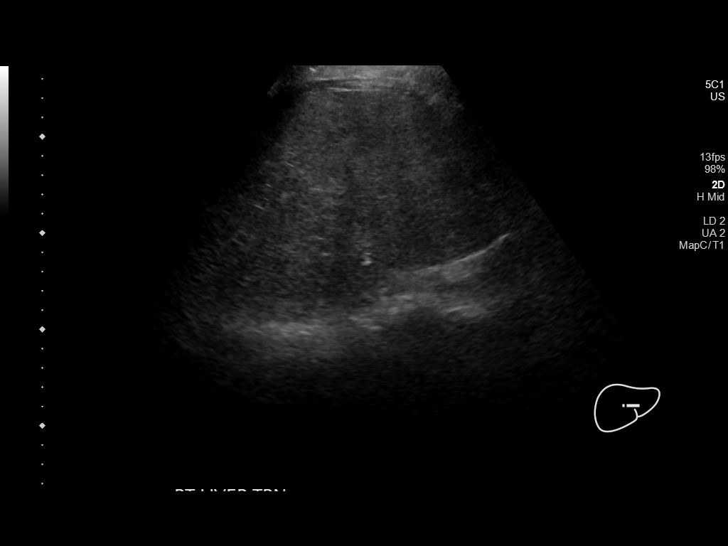
[im 57/69]
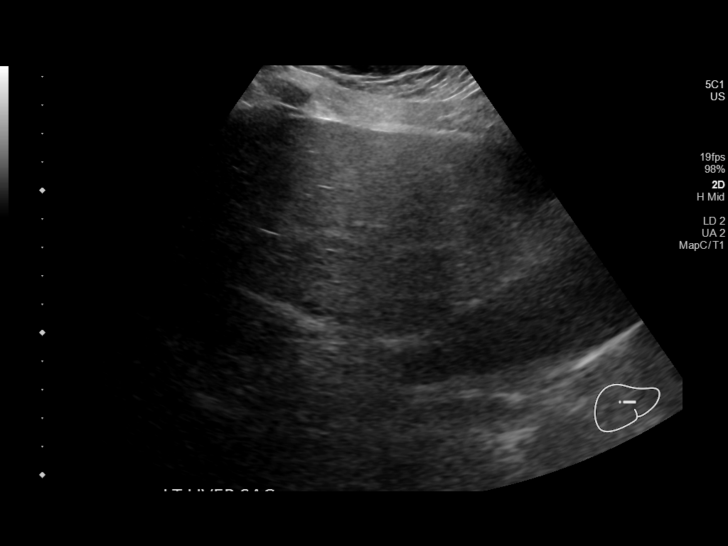
[im 63/69]
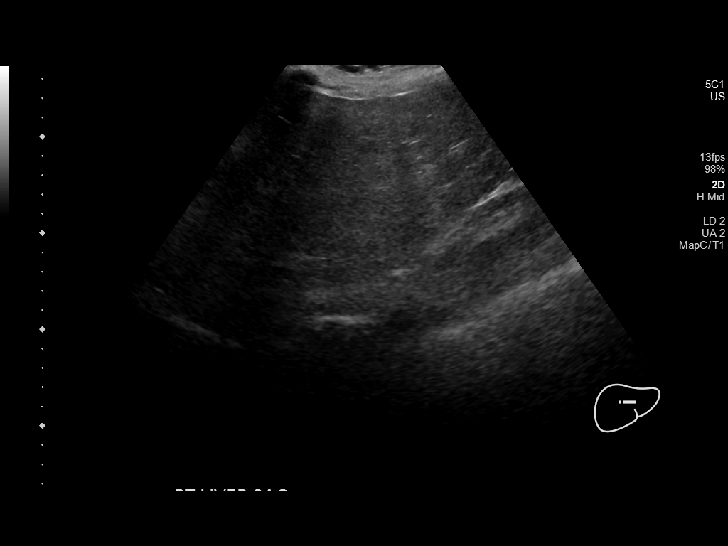
[im 69/69]
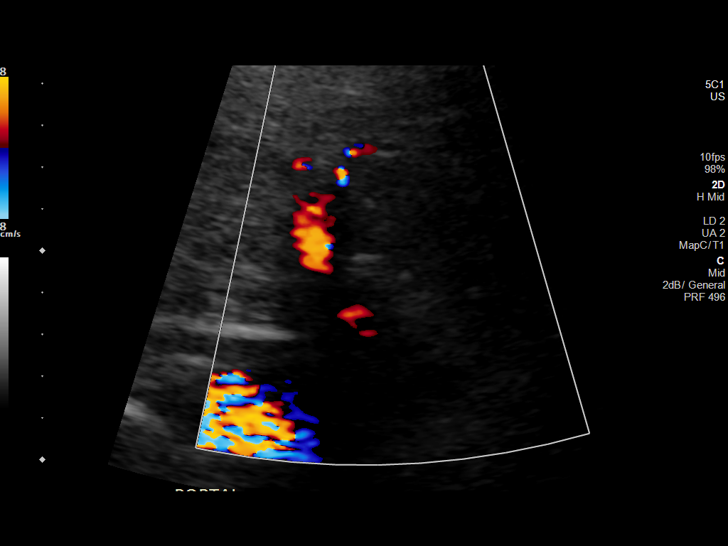

[14 of 25 positions shown; findings below may reference images not displayed]

FINDINGS: Gallbladder:

Layering slightly hyperdense sludge/small stones are seen. No
sonographic Murphy sign noted by sonographer.

Common bile duct:

Diameter: 2 mm

Liver:

Increased echotexture seen throughout with hepatomegaly. No focal
abnormality or biliary ductal dilatation. Portal vein is patent on
color Doppler imaging with normal direction of blood flow towards
the liver.

Other: None.
IMPRESSION: Layering gallbladder sludge/stones. No definite evidence of acute
cholecystitis.

Hepatic steatosis and mild hepatomegaly

## 2020-07-18 MED ORDER — POTASSIUM CHLORIDE 10 MEQ/100ML IV SOLN
10.0000 meq | INTRAVENOUS | Status: AC
  Administered 2020-07-18 (×4): 10 meq via INTRAVENOUS
  Filled 2020-07-18 (×3): qty 100

## 2020-07-18 MED ORDER — THIAMINE HCL 100 MG/ML IJ SOLN: 100.0000 mg | Freq: Every day | INTRAMUSCULAR | Status: DC

## 2020-07-18 MED ORDER — MORPHINE SULFATE (PF) 2 MG/ML IV SOLN
2.0000 mg | INTRAVENOUS | Status: DC | PRN
  Administered 2020-07-18 – 2020-07-20 (×8): 2 mg via INTRAVENOUS
  Filled 2020-07-18 (×8): qty 1

## 2020-07-18 MED ORDER — THIAMINE HCL 100 MG/ML IJ SOLN
100.0000 mg | Freq: Every day | INTRAMUSCULAR | Status: DC
  Administered 2020-07-18 – 2020-07-19 (×2): 100 mg via INTRAVENOUS
  Filled 2020-07-18 (×2): qty 2

## 2020-07-18 MED ORDER — LEVOTHYROXINE SODIUM 50 MCG PO TABS
75.0000 ug | ORAL_TABLET | Freq: Every day | ORAL | Status: DC
  Administered 2020-07-18 – 2020-08-06 (×19): 75 ug via ORAL
  Filled 2020-07-18 (×19): qty 1

## 2020-07-18 MED ORDER — INFLUENZA VAC SPLIT QUAD 0.5 ML IM SUSY
0.5000 mL | PREFILLED_SYRINGE | INTRAMUSCULAR | Status: AC
  Administered 2020-07-22: 0.5 mL via INTRAMUSCULAR
  Filled 2020-07-18: qty 0.5

## 2020-07-18 MED ORDER — SODIUM CHLORIDE 0.9 % IV SOLN
75.0000 mL/h | INTRAVENOUS | Status: DC
  Administered 2020-07-18: 75 mL/h via INTRAVENOUS

## 2020-07-18 MED ORDER — SODIUM CHLORIDE 0.9 % IV SOLN
1.0000 g | INTRAVENOUS | Status: DC
  Administered 2020-07-18 – 2020-07-22 (×5): 1 g via INTRAVENOUS
  Filled 2020-07-18: qty 1
  Filled 2020-07-18 (×2): qty 10
  Filled 2020-07-18: qty 1
  Filled 2020-07-18: qty 10

## 2020-07-18 MED ORDER — POTASSIUM PHOSPHATES 15 MMOLE/5ML IV SOLN
30.0000 mmol | Freq: Once | INTRAVENOUS | Status: AC
  Administered 2020-07-18: 30 mmol via INTRAVENOUS
  Filled 2020-07-18: qty 10

## 2020-07-18 MED ORDER — AMLODIPINE BESYLATE 5 MG PO TABS
5.0000 mg | ORAL_TABLET | Freq: Every day | ORAL | Status: DC
  Administered 2020-07-18 – 2020-08-06 (×20): 5 mg via ORAL
  Filled 2020-07-18 (×20): qty 1

## 2020-07-18 MED ORDER — ENSURE ENLIVE PO LIQD
237.0000 mL | Freq: Two times a day (BID) | ORAL | Status: DC
  Administered 2020-07-18 – 2020-07-24 (×9): 237 mL via ORAL

## 2020-07-18 MED ORDER — ONDANSETRON HCL 4 MG/2ML IJ SOLN
4.0000 mg | Freq: Four times a day (QID) | INTRAMUSCULAR | Status: DC | PRN
  Administered 2020-07-18 – 2020-08-06 (×19): 4 mg via INTRAVENOUS
  Filled 2020-07-18 (×20): qty 2

## 2020-07-18 MED ORDER — MAGNESIUM SULFATE 4 GM/100ML IV SOLN
4.0000 g | Freq: Once | INTRAVENOUS | Status: AC
  Administered 2020-07-18: 4 g via INTRAVENOUS
  Filled 2020-07-18: qty 100

## 2020-07-18 MED ORDER — VENLAFAXINE HCL ER 75 MG PO CP24
75.0000 mg | ORAL_CAPSULE | Freq: Every day | ORAL | Status: DC
  Administered 2020-07-18 – 2020-08-06 (×20): 75 mg via ORAL
  Filled 2020-07-18 (×20): qty 1

## 2020-07-18 MED ORDER — FOLIC ACID 1 MG PO TABS
1.0000 mg | ORAL_TABLET | Freq: Every day | ORAL | Status: DC
  Administered 2020-07-18 – 2020-08-06 (×20): 1 mg via ORAL
  Filled 2020-07-18 (×20): qty 1

## 2020-07-18 MED ORDER — SODIUM CHLORIDE 0.9 % IV SOLN: INTRAVENOUS | Status: DC

## 2020-07-18 MED ORDER — ONDANSETRON HCL 4 MG PO TABS
4.0000 mg | ORAL_TABLET | Freq: Four times a day (QID) | ORAL | Status: DC | PRN
  Administered 2020-07-18 – 2020-08-06 (×3): 4 mg via ORAL
  Filled 2020-07-18 (×3): qty 1

## 2020-07-18 NOTE — Assessment & Plan Note (Signed)
-   continue NS - BMP daily

## 2020-07-18 NOTE — Evaluation (Signed)
Physical Therapy Evaluation Patient Details Name: Judy Meyer MRN: [id number] DOB: 06/30/1961 Today's Date: 07/18/2020   History of Present Illness  59 y.o. female with medical history significant of fibroids, HTN, HLD, depression and admitted for nausea and abdominal pain, found to have Hyperbilirubinemia/ elevated LFT's  Clinical Impression  Pt admitted with above diagnosis.  Pt currently with functional limitations due to the deficits listed below (see PT Problem List). Pt will benefit from skilled PT to increase their independence and safety with mobility to allow discharge to the venue listed below.  Pt with limited mobility due to weakness and nausea. Pt required min assist to perform stand pivot to recliner.  Pt reports MD in this morning and discussing more assessment of her gallbladder and liver.    Follow Up Recommendations Home health PT;Supervision for mobility/OOB    Equipment Recommendations  Rolling walker with 5" wheels    Recommendations for Other Services       Precautions / Restrictions Precautions Precautions: Fall      Mobility  Bed Mobility Overal bed mobility: Needs Assistance Bed Mobility: Supine to Sit     Supine to sit: Min guard;HOB elevated     General bed mobility comments: increased time and effort    Transfers Overall transfer level: Needs assistance Equipment used: 1 person hand held assist Transfers: Sit to/from [facility] Sit to Stand: Min assist Stand pivot transfers: Min assist       General transfer comment: assist to rise and steady, pt declined RW at this time, does not feel strong enough to ambulate but agreeable to pivot to recliner, cues for hand placement  Ambulation/Gait                Stairs            Wheelchair Mobility    Modified Rankin (Stroke Patients Only)       Balance Overall balance assessment: Needs assistance         Standing balance support: Single extremity  supported Standing balance-Leahy Scale: Poor Standing balance comment: requiring UE support today                             Pertinent Vitals/Pain Pain Assessment: No/denies pain (c/o nausea)    Home Living Family/patient expects to be discharged to:: Private residence Living Arrangements: Spouse/significant other;Other relatives Available Help at Discharge: Family Type of Home: House       Home Layout: One level Home Equipment: None      Prior Function Level of Independence: Independent         Comments: however just prior to admission, pt reports being so weak, her husband had to carry her     Hand Dominance        Extremity/Trunk Assessment        Lower Extremity Assessment Lower Extremity Assessment: Generalized weakness    Cervical / Trunk Assessment Cervical / Trunk Assessment: Normal  Communication   Communication: No difficulties  Cognition Arousal/Alertness: Awake/alert Behavior During Therapy: WFL for tasks assessed/performed Overall Cognitive Status: Within Functional Limits for tasks assessed                                        General Comments      Exercises     Assessment/Plan    PT Assessment Patient needs continued  PT services  PT Problem List Decreased strength;Decreased mobility;Decreased activity tolerance;Decreased balance;Decreased knowledge of use of DME       PT Treatment Interventions DME instruction;Gait training;Balance training;Therapeutic exercise;Functional mobility training;Stair training;Therapeutic activities;Patient/family education    PT Goals (Current goals can be found in the Care Plan section)  Acute Rehab PT Goals PT Goal Formulation: With patient Time For Goal Achievement: 08/01/20 Potential to Achieve Goals: Good    Frequency Min 3X/week   Barriers to discharge        Co-evaluation               AM-PAC PT "6 Clicks" Mobility  Outcome Measure Help needed  turning from your back to your side while in a flat bed without using bedrails?: A Little Help needed moving from lying on your back to sitting on the side of a flat bed without using bedrails?: A Little Help needed moving to and from a bed to a chair (including a wheelchair)?: A Little Help needed standing up from a chair using your arms (e.g., wheelchair or bedside chair)?: A Little Help needed to walk in hospital room?: A Lot Help needed climbing 3-5 steps with a railing? : A Lot 6 Click Score: 16    End of Session Equipment Utilized During Treatment: Gait belt Activity Tolerance: Other (comment) (limited by nausea) Patient left: with call bell/phone within reach;in chair (pt aware to call for assist out of chair (no alarm box in room))   PT Visit Diagnosis: Difficulty in walking, not elsewhere classified (R26.2);Muscle weakness (generalized) (M62.81)    Time: 4098-1191 PT Time Calculation (min) (ACUTE ONLY): 11 min   Charges:   PT Evaluation $PT Eval Low Complexity: 1 Low     Kati PT, DPT Acute Rehabilitation Services Pager: [phone] Office: [phone]  York Ram E 07/18/2020, 12:55 PM

## 2020-07-18 NOTE — H&P (Signed)
Judy Meyer ZSW:[id number] DOB: 1960/11/04 DOA: 07/17/2020    PCP: Pcp, No   Outpatient Specialists:    GI in [place] had colonoscopy  Patient arrived to ER on 07/17/20 at 1948 Referred by Attending Horton, Alvin Critchley, DO   Patient coming from: home Lives   With family    Chief Complaint:   Chief Complaint  Patient presents with  . Vomiting  . Abdominal Pain    HPI: Judy Meyer is a 59 y.o. female with medical history significant of fibroids, HTN, HLD, depression,      Presented with   Nausea vomiting and yellow skin for the oast few weeks Admitted for intractable nausea/voming in August felt to be due to gastroenteritis She was noted to have potassium of 2.7 which was repleted and was discharged to home Patient states that since then she has been kind of vomiting every day. She also has history of uterine fibroids and been bleeding for few weeks now. She has been feeling very weak and has no energy  Reports sharp right lower abdominal pain for the past few months Also right sided back pain  Nausea vomiting been so significant she is not able to tolerate her home medications. She has had decreased p.o. intake and only had some small bowel movements. She has not had any fevers but chills Reports mud looking BM looks yellow Her urine been turning darker but her stools have been lighter, fluffy and floating She thinks she lost weight  No chest pain no cough no shortness of breath  She reportedly has been seen by GI as an outpatient in [place] She used to live in [place] but now divides her time between [place] and here.  She drinks a glass of vodka on weekends Does not smoke No THC Does not take tylenol  iInfectious risk factors:  Reports  N/V/Diarrhea/abdominal pain,      Has   been vaccinated against COVID not boosted   Initial COVID TEST   in house  PCR testing  Pending  Lab Results  Component Value Date   [place] NEGATIVE  04/06/2020   Regarding pertinent Chronic problems:     Hyperlipidemia -  on statins Crestor    HTN on norvasc     Hypothyroidism:   on synthroid   While in ER: K 3.0 ast <5  aLt <94 TOTAL bILI elevated at 12  I send Epic msg: [facility]    Dr. Paulita Fujita     Hospitalist was called for admission for elevated bilirubin nausea vomiting dehydration and hypokalemia The following Work up has been ordered so far:  Orders Placed This Encounter  Procedures  . CT Abdomen Pelvis W Contrast  . Lipase, blood  . Comprehensive metabolic panel  . CBC  . Urinalysis, Routine w reflex microscopic  . Hepatitis panel, acute  . Diet NPO time specified  . In and Out Cath  . Consult to hospitalist  ALL PATIENTS BEING ADMITTED/HAVING PROCEDURES NEED COVID-19 SCREENING  . I-Stat beta hCG blood, ED  . I-Stat beta hCG blood, ED     Following Medications were ordered in ER: Medications  ondansetron (ZOFRAN) injection 4 mg (has no administration in time range)  morphine 4 MG/ML injection 4 mg (has no administration in time range)  sodium chloride 0.9 % bolus 1,000 mL (1,000 mLs Intravenous New Bag/Given 07/17/20 2236)  ketorolac (TORADOL) 30 MG/ML injection 30 mg (30 mg Intravenous Given 07/17/20 2155)  famotidine (PEPCID) IVPB  20 mg premix (0 mg Intravenous Stopped 07/17/20 2238)  ondansetron (ZOFRAN) injection 4 mg (4 mg Intravenous Given 07/17/20 2153)  diphenhydrAMINE (BENADRYL) injection 25 mg (25 mg Intravenous Given 07/17/20 2151)  iohexol (OMNIPAQUE) 300 MG/ML solution 100 mL (100 mLs Intravenous Contrast Given 07/17/20 2221)        Consult Orders  (From admission, onward)         Start     Ordered   07/17/20 2301  Consult to hospitalist  ALL PATIENTS BEING ADMITTED/HAVING PROCEDURES NEED COVID-19 SCREENING  Once       Comments: ALL PATIENTS BEING ADMITTED/HAVING PROCEDURES NEED COVID-19 SCREENING  Provider:  (Not yet assigned)  Question Answer Comment  Place call to: [facility]    Reason for Consult Admit      07/17/20 2300           Significant initial  Findings: Abnormal Labs Reviewed  LIPASE, BLOOD - Abnormal; Notable for the following components:      Result Value   Lipase 53 (*)    All other components within normal limits  COMPREHENSIVE METABOLIC PANEL - Abnormal; Notable for the following components:   Potassium 3.0 (*)    Chloride 93 (*)    CO2 20 (*)    Glucose, Bld 137 (*)    Calcium 8.5 (*)    Albumin 3.3 (*)    AST <5 (*)    ALT 94 (*)    Alkaline Phosphatase 128 (*)    Total Bilirubin 12.8 (*)    Anion gap 23 (*)    All other components within normal limits  CBC - Abnormal; Notable for the following components:   RBC 3.05 (*)    HCT 35.4 (*)    MCV 116.1 (*)    MCH 39.3 (*)    RDW 18.9 (*)    nRBC 0.6 (*)    All other components within normal limits  URINALYSIS, ROUTINE W REFLEX MICROSCOPIC - Abnormal; Notable for the following components:   Color, Urine RED (*)    APPearance CLOUDY (*)    Glucose, UA   (*)    Value: TEST NOT REPORTED DUE TO COLOR INTERFERENCE OF URINE PIGMENT   Hgb urine dipstick   (*)    Value: TEST NOT REPORTED DUE TO COLOR INTERFERENCE OF URINE PIGMENT   Bilirubin Urine   (*)    Value: TEST NOT REPORTED DUE TO COLOR INTERFERENCE OF URINE PIGMENT   Ketones, ur   (*)    Value: TEST NOT REPORTED DUE TO COLOR INTERFERENCE OF URINE PIGMENT   Protein, ur   (*)    Value: TEST NOT REPORTED DUE TO COLOR INTERFERENCE OF URINE PIGMENT   Nitrite   (*)    Value: TEST NOT REPORTED DUE TO COLOR INTERFERENCE OF URINE PIGMENT   Leukocytes,Ua   (*)    Value: TEST NOT REPORTED DUE TO COLOR INTERFERENCE OF URINE PIGMENT   RBC / HPF >50 (*)    Bacteria, UA MANY (*)    All other components within normal limits    Otherwise labs showing:    Recent Labs  Lab 07/17/20 2021  NA 136  K 3.0*  CO2 20*  GLUCOSE 137*  BUN 13  CREATININE 0.76  CALCIUM 8.5*    Cr stable,  Up from baseline see below Lab Results   Component Value Date   CREATININE 0.76 07/17/2020   CREATININE 0.80 04/07/2020   CREATININE 0.77 04/06/2020    Recent Labs  Lab  07/17/20 2021  AST <5*  ALT 94*  ALKPHOS 128*  BILITOT 12.8*  PROT 7.1  ALBUMIN 3.3*   Lab Results  Component Value Date   CALCIUM 8.5 (L) 07/17/2020        WBC      Component Value Date/Time   WBC 8.0 07/17/2020 2021    Plt: Lab Results  Component Value Date   PLT 200 07/17/2020     Lactic Acid, Venous Ordered      Ordered Venous  Blood Gas result:   HG/HCT  stable,      Component Value Date/Time   HGB 12.0 07/17/2020 2021   HCT 35.4 (L) 07/17/2020 2021   MCV 116.1 (H) 07/17/2020 2021    Recent Labs  Lab 07/17/20 2021  LIPASE 53*   No results for input(s): AMMONIA in the last 168 hours.   ECG: Ordered    UA evidence of UTI   With elevated WBC and bacteruria   Urine analysis:    Component Value Date/Time   COLORURINE RED (A) 07/17/2020 2100   APPEARANCEUR CLOUDY (A) 07/17/2020 2100   LABSPEC  07/17/2020 2100    TEST NOT REPORTED DUE TO COLOR INTERFERENCE OF URINE PIGMENT   PHURINE  07/17/2020 2100    TEST NOT REPORTED DUE TO COLOR INTERFERENCE OF URINE PIGMENT   GLUCOSEU (A) 07/17/2020 2100    TEST NOT REPORTED DUE TO COLOR INTERFERENCE OF URINE PIGMENT   HGBUR (A) 07/17/2020 2100    TEST NOT REPORTED DUE TO COLOR INTERFERENCE OF URINE PIGMENT   BILIRUBINUR (A) 07/17/2020 2100    TEST NOT REPORTED DUE TO COLOR INTERFERENCE OF URINE PIGMENT   KETONESUR (A) 07/17/2020 2100    TEST NOT REPORTED DUE TO COLOR INTERFERENCE OF URINE PIGMENT   PROTEINUR (A) 07/17/2020 2100    TEST NOT REPORTED DUE TO COLOR INTERFERENCE OF URINE PIGMENT   NITRITE (A) 07/17/2020 2100    TEST NOT REPORTED DUE TO COLOR INTERFERENCE OF URINE PIGMENT   LEUKOCYTESUR (A) 07/17/2020 2100    TEST NOT REPORTED DUE TO COLOR INTERFERENCE OF URINE PIGMENT         RUQ [place] Ordered CTabd/pelvis - hepatic steatosis, fluid filled gall bladder no  cholecystis,  fibroids   ED Triage Vitals [07/17/20 2016]  Enc Vitals Group     BP (!) 141/90     Pulse Rate (!) 108     Resp 16     Temp 98.6 F (37 C)     Temp Source Oral     SpO2 97 %     Weight 140 lb (63.5 kg)     Height 5\' 6"  (1.676 m)     Head Circumference      Peak Flow      Pain Score 8     Pain Loc      Pain Edu?      Excl. in [place]?   XNAT(55)@       Latest  Blood pressure (!) 159/85, pulse 81, temperature 98.6 F (37 C), temperature source Oral, resp. rate 18, height 5\' 6"  (1.676 m), weight 63.5 kg, SpO2 99 %.    Review of Systems:    Pertinent positives include:   fatigue  nausea, vomiting, Constitutional:  No weight loss, night sweats, Fevers, chills, weight loss  HEENT:  No headaches, Difficulty swallowing,Tooth/dental problems,Sore throat,  No sneezing, itching, ear ache, nasal congestion, post nasal drip,  Cardio-vascular:  No chest pain, Orthopnea, PND, anasarca, dizziness, palpitations.no Bilateral lower  extremity swelling  GI:  No heartburn, indigestion, abdominal pain, diarrhea, change in bowel habits, loss of appetite, melena, blood in stool, hematemesis Resp:  no shortness of breath at rest. No dyspnea on exertion, No excess mucus, no productive cough, No non-productive cough, No coughing up of blood.No change in color of mucus.No wheezing. Skin:  no rash or lesions. No jaundice GU:  no dysuria, change in color of urine, no urgency or frequency. No straining to urinate.  No flank pain.  Musculoskeletal:  No joint pain or no joint swelling. No decreased range of motion. No back pain.  Psych:  No change in mood or affect. No depression or anxiety. No memory loss.  Neuro: no localizing neurological complaints, no tingling, no weakness, no double vision, no gait abnormality, no slurred speech, no confusion  All systems reviewed and apart from [place] all are negative  Past Medical History:   Past Medical History:  Diagnosis Date  . Hypertension    . Hypothyroidism       History reviewed. No pertinent surgical history.  Social History:  Ambulatory   Independently      reports that she has never smoked. She has never used smokeless tobacco. She reports current alcohol use. She reports previous drug use.    Family History:   Family History  Problem Relation Age of Onset  . Hypertension Other     Allergies: Allergies  Allergen Reactions  . Compazine [Prochlorperazine Edisylate] Anaphylaxis  . Periactin [Cyproheptadine] Anaphylaxis  . Cyclobenzaprine     Palpations   . Haloperidol And Related   . Metoclopramide Other (See Comments)  . Other Other (See Comments)  . Prochlorperazine Other (See Comments)     Prior to Admission medications   Medication Sig Start Date End Date Taking? Authorizing Provider  albuterol (VENTOLIN HFA) 108 (90 Base) MCG/ACT inhaler Inhale 2 puffs into the lungs 4 (four) times daily as needed for wheezing or shortness of breath. 12/20/19  Yes [provider]  amLODipine (NORVASC) 5 MG tablet Take 1 tablet (5 mg total) by mouth daily. 04/08/20 05/08/20 Yes Guilford Shi, MD  eszopiclone (LUNESTA) 1 MG TABS tablet Take 1 mg by mouth at bedtime as needed for sleep. 02/11/20  Yes [provider]  Galcanezumab-gnlm 120 MG/ML SOAJ Inject 120 mLs into the skin every 30 (thirty) days.   Yes [provider]  levothyroxine (SYNTHROID, LEVOTHROID) 75 MCG tablet Take 75 mcg by mouth daily before breakfast.   Yes [provider]  norethindrone-ethinyl estradiol (NORTREL 7/7/7) 0.5/0.75/1-35 MG-MCG tablet Take 1 tablet by mouth daily.   Yes [provider]  rosuvastatin (CRESTOR) 5 MG tablet Take 5 mg by mouth daily.   Yes [provider]  venlafaxine XR (EFFEXOR-XR) 75 MG 24 hr capsule Take 75 mg by mouth daily.   Yes [provider]   Physical Exam: Vitals with BMI 07/17/2020 07/17/2020 07/17/2020  Height - - -  Weight - - -  BMI - - -   Systolic 025 427 062  Diastolic 85 83 91  Pulse 81 80 81     1. General:  in No  Acute distress   Chronically ill  -appearing 2. Psychological: Alert and   Oriented 3. Head/ENT:   Dry Mucous Membranes                          Head Non traumatic, neck supple  Poor Dentition 4. SKIN:  decreased Skin turgor,  Skin clean Dry and intact no rash, abnormal nails, old blood on the feet bilaterally from prior vaginal bleed 5. Heart: Regular rate and rhythm no  Murmur, no Rub or gallop 6. Lungs:  no wheezes or crackles   7. Abdomen: Soft, generalized-tender, Non distended  bowel sounds present 8. Lower extremities: no clubbing, cyanosis, no  edema 9. Neurologically Grossly intact, moving all 4 extremities equally , tremulous, 10. MSK: Normal range of motion   All other LABS:     Recent Labs  Lab 07/17/20 2021  WBC 8.0  HGB 12.0  HCT 35.4*  MCV 116.1*  PLT 200     Recent Labs  Lab 07/17/20 2021  NA 136  K 3.0*  CL 93*  CO2 20*  GLUCOSE 137*  BUN 13  CREATININE 0.76  CALCIUM 8.5*     Recent Labs  Lab 07/17/20 2021  AST <5*  ALT 94*  ALKPHOS 128*  BILITOT 12.8*  PROT 7.1  ALBUMIN 3.3*       Cultures: No results found for: SDES, SPECREQUEST, CULT, REPTSTATUS   Radiological Exams on Admission: CT Abdomen Pelvis W Contrast  Result Date: 07/17/2020 CLINICAL DATA:  Abdominal pain and vomiting EXAM: CT ABDOMEN AND PELVIS WITH CONTRAST TECHNIQUE: Multidetector CT imaging of the abdomen and pelvis was performed using the standard protocol following bolus administration of intravenous contrast. CONTRAST:  169mL OMNIPAQUE IOHEXOL 300 MG/ML  SOLN COMPARISON:  April 06, 2020 FINDINGS: Lower chest: The visualized heart size within normal limits. No pericardial fluid/thickening. No hiatal hernia. The visualized portions of the lungs are clear. Hepatobiliary: There is diffuse low density seen throughout the liver parenchyma with mild hepatomegaly.The  main portal vein is patent. The gallbladder is mildly fluid-filled and distended. No intrahepatic biliary ductal dilatation is noted. Pancreas: Unremarkable. No pancreatic ductal dilatation or surrounding inflammatory changes. Spleen: Normal in size without focal abnormality. Adrenals/Urinary Tract: Both adrenal glands appear normal. Within the lower pole of the right kidney again noted is a 2.1 cm fat containing lesion, likely angiomyolipoma. There is a 3 mm calculus in the lower pole of the right kidney. No hydronephrosis. The bladder is partially decompressed. Stomach/Bowel: The stomach, small bowel, and colon are normal in appearance. No inflammatory changes, wall thickening, or obstructive findings.The appendix is normal. Vascular/Lymphatic: There are no enlarged mesenteric, retroperitoneal, or pelvic lymph nodes. Scattered mild aortic atherosclerosis is seen. Reproductive: Multiple heterogeneously enhancing uterine fibroids are again identified with the large uterus. Some of which appear to be partially calcified. Other: No evidence of abdominal wall mass or hernia. Musculoskeletal: No acute or significant osseous findings. IMPRESSION: Hepatic steatosis. Mildly distended fluid-filled gallbladder, however no definite evidence of surrounding inflammatory changes. Stable 2 cm right-sided renal angiomyolipoma Nonobstructing right renal calculus. Enlarged uterus with multiple uterine fibroids. Aortic Atherosclerosis (ICD10-I70.0). Electronically Signed   By: Prudencio Pair M.D.   On: 07/17/2020 22:51    Chart has been reviewed    Assessment/Plan  60 y.o. female with medical history significant of fibroids, HTN, HLD, depression,    Admitted for hyperbilirubinemia, dehydration, hypokalemia  Present on Admission: . Hyperbilirubinemia/ elevated LFT's -initial chest sounds obstructive in nature, given yellow fluffy stools and dark urine with chronic nausea vomiting upper abdominal tenderness. Also evidence of  hepatic steatosis on CT and dilated gallbladder Will obtain right upper quadrant ultrasound to try to evaluate gallbladder further Appreciate GI consult Sent message to [facility] Pending further results may need further  evaluation for any obstructive lesion of gallbladder  For completion we will obtain INR, ammonia level, hepatitis serologies, alcohol level, urine toxicity screen, obtain direct and direct bilirubin  Elevated MCV we will check B12 folate levels CBC with differential. Suspect patient may have some malnutrition secondary to decreased p.o. intake for months. Appreciate nutritional consult check prealbumin  Patient does drink alcohol but states have not been drinking for the past 2 weeks and only drinks occasionally would monitor for any sign of withdrawal Check phosphate levels  . Intractable nausea and vomiting  - supportive measure for now appreciate Gi consult  . Hypothyroidism - - Check TSH continue home medications at current dose   . Hyperlipidemia - hold statin given abnormal LFT  . Hypokalemia - - will replace and repeat in AM,  check magnesium level and replace as needed  . HTN (hypertension) - resume Norvasc if able to tolerate  . Acute lower UTI -  - treat with Rocephin       await results of urine culture and adjust antibiotic coverage as needed Patient reports chills lower abdominal pain  . Dehydration -  - we'll administer IV fluids and recheck orthostatics in the morning Add on CK levels  . Metabolic acidosis -differential includes ketoacidosis versus lactic acid acidosis Rehydrate and follow obtain VBG  History of recurrent vaginal bleeding secondary to fibroid uterus. Patient will need to continue follow-up with OB/GYN resume home medications if able may need to have definitive management at a later time Other plan as per orders.  DVT prophylaxis:  SCD       Code Status:    Code Status: Prior FULL CODE as per patient   I had personally discussed  CODE STATUS with patient    Family Communication:   Family not at  Bedside   Disposition Plan:    To home once workup is complete and patient is stable  Following barriers for discharge:                            Electrolytes corrected                                                         Pain controlled with PO medications                               Afebrile, white count improving able to transition to PO antibiotics                             Will need to be able to tolerate PO                            Will likely need home health                           Will need consultants to evaluate patient prior to discharge                   Would benefit from PT/OT eval prior to DC  Ordered  Transition of care consulted                   Nutrition    consulted                                    Consults called: Sent message to [facility] Dr. Paulita Fujita using epic  Admission status:  ED Disposition    ED Disposition Condition Comment   Admit  The patient appears reasonably stabilized for admission considering the current resources, flow, and capabilities available in the ED at this time, and I doubt any other [facility] requiring further screening and/or treatment in the ED prior to admission is  present.      Obs   Level of care    tele  For  24H     Lab Results  Component Value Date   [place] NEGATIVE 04/06/2020     Precautions: admitted as  basymptomatic screening protocol No active isolations  PPE: Used by the provider:   P100  eye Goggles,  Gloves      Hyman Crossan 07/18/2020, 1:47 AM   [facility]     after 2 AM please page floor coverage PA If 7AM-7PM, please contact the day team taking care of the patient using [email or web address]   Patient was evaluated in the context of the global COVID-19 pandemic, which necessitated consideration that the patient might be at risk for infection with the SARS-CoV-2 virus that causes  COVID-19. Institutional protocols and algorithms that pertain to the evaluation of patients at risk for COVID-19 are in a state of rapid change based on information released by regulatory bodies including the CDC and federal and state organizations. These policies and algorithms were followed during the patient's care.

## 2020-07-18 NOTE — Assessment & Plan Note (Signed)
-  Statin on hold for now in setting of abnormal LFTs

## 2020-07-18 NOTE — Assessment & Plan Note (Addendum)
-   elevated iron, sat ratio, and ferritin - unclear etiology; no chronic transfusions -Patient presented with abdominal pain, back pain, weakness.  Jaundiced.  LFTs abnormal. - check A1c = 5.1% - no signs of secondary hypothyroidism as TSH is elevated - follow up GI workup; testing of hemochromatosis and other etiologies at this time

## 2020-07-18 NOTE — Plan of Care (Signed)
  Problem: Education: Goal: Knowledge of General Education information will improve Description: Including pain rating scale, medication(s)/side effects and non-pharmacologic comfort measures Outcome: Progressing   Problem: Health Behavior/Discharge Planning: Goal: Ability to manage health-related needs will improve Outcome: Progressing   Problem: Clinical Measurements: Goal: Will remain free from infection Outcome: Progressing Goal: Respiratory complications will improve Outcome: Progressing   Problem: Activity: Goal: Risk for activity intolerance will decrease Outcome: Progressing   Problem: Nutrition: Goal: Adequate nutrition will be maintained Outcome: Progressing   Problem: Coping: Goal: Level of anxiety will decrease Outcome: Progressing

## 2020-07-18 NOTE — Progress Notes (Signed)
Occupational Therapy Evaluation  Patient lives with spouse in single level home and is typically I with ADLs. Patient reports prior to admission was feeling so weak spouse had to carry her. Today's treatment limited due to orthostatic hypotension in standing with 36 point drop in systolic pressure and patient report of feeling dizzy. Patient was min G  for sit to stand due to mild sway/unsteadiness. Recommend continued acute OT services in order to maximize patient safety and independence with self care in order to facilitate D/C to venue listed below.    07/18/20 1400  OT Visit Information  Last OT Received On 07/18/20  Assistance Needed +1  History of Present Illness 59 y.o. female with medical history significant of fibroids, HTN, HLD, depression and admitted for nausea and abdominal pain, found to have Hyperbilirubinemia/ elevated LFT's  Precautions  Precautions Fall  Precaution Comments orthostatic  Restrictions  Weight Bearing Restrictions No  Home Living  Family/patient expects to be discharged to: Private residence  Living Arrangements Spouse/significant other;Other relatives  Available Help at Discharge Family  Type of [place] One level  Bathroom Shower/Tub Tub/shower unit  Automotive engineer None  Prior Function  Level of Independence Independent  Comments however just prior to admission, pt reports being so weak, her husband had to carry her  Communication  Communication No difficulties  Pain Assessment  Pain Assessment No/denies pain  Cognition  Arousal/Alertness Awake/alert  Behavior During Therapy WFL for tasks assessed/performed  Overall Cognitive Status Within Functional Limits for tasks assessed  Upper Extremity Assessment  Upper Extremity Assessment Generalized weakness  Lower Extremity Assessment  Lower Extremity Assessment Defer to PT evaluation  Cervical / Trunk Assessment  Cervical / Trunk Assessment Normal  ADL   Overall ADL's  Needs assistance/impaired  Eating/Feeding Set up;Sitting  Grooming Set up;Sitting  Upper Body Bathing Set up;Sitting  Lower Body Bathing Minimal assistance;Sitting/lateral leans;Sit to/from stand  Upper Body Dressing  Set up;Sitting  Lower Body Dressing Minimal assistance;Sitting/lateral leans;Sit to/from Development worker, community Details (indicate cue type and reason) patient sit to stand from EOB, min G for stability due to mild sway. patient orthostatic in standing therefore returned to [place] and Hygiene Minimal assistance;Sit to/from stand  Functional mobility during ADLs Min guard  General ADL Comments session limited due to patient orthostatic in standing with 36 point systolic drop and report of dizziness.  Vision- History  Baseline Vision/History Wears glasses  Wears Glasses At all times  Bed Mobility  Overal bed mobility Needs Assistance  Bed Mobility Supine to Sit  Supine to sit Min guard;HOB elevated  General bed mobility comments increased time and effort  Transfers  Overall transfer level Needs assistance  Equipment used None  Transfers Sit to/from Stand  Sit to Stand Min guard  General transfer comment please see toilet transfer in ADL section  Balance  Overall balance assessment Needs assistance  Sitting-balance support Feet supported  Sitting balance-Leahy Scale Fair  Standing balance support No upper extremity supported  Standing balance-Leahy Scale Fair  Standing balance comment min G static standing  OT - End of Session  Activity Tolerance Treatment limited secondary to medical complications (Comment) (orthostatic)  Patient left in bed;with call bell/phone within reach;with nursing/sitter in room  Nurse Communication Other (comment) (RN present for treatment)  OT Assessment  OT Recommendation/Assessment Patient needs continued OT Services  OT Visit Diagnosis Other abnormalities of gait  and mobility (  R26.89)  OT Problem List Decreased activity tolerance;Impaired balance (sitting and/or standing)  OT Plan  OT Frequency (ACUTE ONLY) Min 2X/week  OT Treatment/Interventions (ACUTE ONLY) Self-care/ADL training;Therapeutic exercise;Therapeutic activities;Patient/family education;Balance training;DME and/or AE instruction  AM-PAC OT "6 Clicks" Daily Activity Outcome Measure (Version 2)  Help from another person eating meals? 4  Help from another person taking care of personal grooming? 3  Help from another person toileting, which includes using toliet, bedpan, or urinal? 3  Help from another person bathing (including washing, rinsing, drying)? 3  Help from another person to put on and taking off regular upper body clothing? 3  Help from another person to put on and taking off regular lower body clothing? 3  6 Click Score 19  OT Recommendation  Follow Up Recommendations No OT follow up  OT Equipment Tub/shower seat  Individuals Consulted  Consulted and Agree with Results and Recommendations Patient  Acute Rehab OT Goals  Patient Stated Goal less nausea  OT Goal Formulation With patient  Time For Goal Achievement 08/01/20  Potential to Achieve Goals Good  OT Time Calculation  OT Start Time (ACUTE ONLY) 1330  OT Stop Time (ACUTE ONLY) 1345  OT Time Calculation (min) 15 min  OT General Charges  $OT Visit 1 Visit  OT Evaluation  $OT Eval Low Complexity 1 Low  Written Expression  Dominant Hand  (did not specify)   Delbert Phenix OT OT pager: [phone]

## 2020-07-18 NOTE — ED Provider Notes (Signed)
12:37 AM Discussed patient's case with hospitalist, Dr. Roel Cluck.  I have recommended admission and patient (and family if present) agree with this plan. Admitting physician will place admission orders.   I reviewed all nursing notes, vitals, pertinent previous records and reviewed/interpreted all EKGs, lab and urine results, imaging (as available).     Clementine Soulliere, Delice Bison, DO 07/18/20 [phone]

## 2020-07-18 NOTE — Progress Notes (Signed)
PROGRESS NOTE    VERSIA MIGNOGNA   WUX:[id number]  DOB: 08-Feb-1961  DOA: 07/17/2020     0  PCP: Pcp, No  CC: jaundice, abd pain, N/V, weakness, weight loss  Hospital Course: Ms. Smaldone is a 59  yo female with PMH hypothyroidism, hypertension, uterine fibroids, hyperlipidemia, depression who presented to the ER with worsening nausea and vomiting.  She endorsed a global decline over the past 4 months and has been acutely declining over the past several days to weeks.  She has noticed yellowing of her skin, decreased strength, and weight loss but has not been weighing herself. She denied any excessive alcohol use, tobacco use, or illicit drug use on admission.  Also does not take any significant amounts of Tylenol.  On work-up she was found to have multiple lab abnormalities.  Also underwent ultrasound abdomen which showed gallbladder stones and sludge. LFTs consistent with obstructive jaundice but no cholangitis (afebrile, normal WBC).  GI was consulted on admission as well.    Interval History:  Patient seen in room this am. Weak and lethargic. Having RUQ abdominal pain that started yesterday which finally prompted her to come to hospital despite about 4 months of a gradual decline at home.  Several lab abnormalities. Gallbladder stones and sludge on ultrasound. No prior history of similar.   Old records reviewed in assessment of this patient  ROS: Constitutional: positive for anorexia, fatigue, malaise and weight loss, Respiratory: negative for cough, Cardiovascular: negative for chest pain, Gastrointestinal: positive for abdominal pain, jaundice, nausea and vomiting, negative for melena and Neurological: positive for weakness, negative for memory problems and confusion  Assessment & Plan: * Obstructive jaundice - no s/s ascending cholangitis at this time (patient afebrile and no leukocytosis) - GI consulted as well, follow up recommendations - LFTs consistent with obstructive  pattern (Ultrasound shows cholelithiasis and sludge, no evidence of acute cholecystitis) - may still need surgical input regarding CCY given symptomatic cholelithiasis - continue CLD for now and anti-emetics - lactic considered elevated in setting of abnormal hepatic function; does not appear septic or toxic - patient on Rocephin for possible UTI coverage which will also cover for now as well - trend CMP - lipase 53 with obstructive picture; may need MRCP and/or ERCP depending results; await GI eval  Iron excess - elevated iron, sat ratio, and ferritin - unclear etiology; no chronic transfusions -Patient presented with abdominal pain, back pain, weakness.  Jaundiced.  LFTs abnormal. - check A1c - no signs of secondary hypothyroidism as TSH is elevated -Follow-up GI eval; needs HH consideration? (high ferritin and sat ratio)  Hypothyroidism -TSH elevated. (14.67>>24.52) - check FT4 (normal, 0.97) and TT3 - likely elevated TSH 2/2 acute illness; no obvious signs of myxedema at this time  Acute lower UTI - UA unable to be tested - patient on empiric treatment with Rocephin; acceptable given underlying GB pathology for now  Macrocytic anemia -B12 is normal, folate low -Check iron panel as well in case next mixed: Iron 191 (elevated), Sat 88% (high), elevated ferritin (802) -Continue on folic supplement  Metabolic acidosis - continue NS - BMP daily   HTN (hypertension) -Continue amlodipine  Hyperlipidemia -Statin on hold for now in setting of abnormal LFTs  Hypokalemia -Replete and recheck as needed    Antimicrobials: Rocephin 12/10>>current   DVT prophylaxis: SCD Code Status: Full Family Communication: none present Disposition Plan: Status is: Inpatient  Remains inpatient appropriate because:Persistent severe electrolyte disturbances, Ongoing active pain requiring inpatient pain management, Ongoing  diagnostic testing needed not appropriate for outpatient work up, IV  treatments appropriate due to intensity of illness or inability to take PO and Inpatient level of care appropriate due to severity of illness   Dispo: The patient is from: Home              Anticipated d/c is to: Home              Anticipated d/c date is: > 3 days              Patient currently is not medically stable to d/c.   Objective: Blood pressure (!) 143/93, pulse 85, temperature 98.3 F (36.8 C), temperature source Oral, resp. rate 18, height 5\' 6"  (1.676 m), weight 63.5 kg, SpO2 97 %.  Examination: General appearance: awake, alert, on distress, grossly jaundiced Head: Normocephalic, without obvious abnormality, atraumatic Eyes: scleral icterus, EOMI Lungs: clear to auscultation bilaterally Heart: regular rate and rhythm and S1, S2 normal Abdomen: RUQ TTP with palpable GB; BS present Extremities: trace LE edema Skin: grossly jaundiced Neurologic: No focal deficits, no confusion   Consultants:   GI  Procedures:     Data Reviewed: I have personally reviewed following labs and imaging studies Results for orders placed or performed during the hospital encounter of 07/17/20 (from the past 24 hour(s))  Lipase, blood     Status: Abnormal   Collection Time: 07/17/20  8:21 PM  Result Value Ref Range   Lipase 53 (H) 11 - 51 U/L  Comprehensive metabolic panel     Status: Abnormal   Collection Time: 07/17/20  8:21 PM  Result Value Ref Range   Sodium 136 135 - 145 mmol/L   Potassium 3.0 (L) 3.5 - 5.1 mmol/L   Chloride 93 (L) 98 - 111 mmol/L   CO2 20 (L) 22 - 32 mmol/L   Glucose, Bld 137 (H) 70 - 99 mg/dL   BUN 13 6 - 20 mg/dL   Creatinine, Ser 0.76 0.44 - 1.00 mg/dL   Calcium 8.5 (L) 8.9 - 10.3 mg/dL   Total Protein 7.1 6.5 - 8.1 g/dL   Albumin 3.3 (L) 3.5 - 5.0 g/dL   AST <5 (L) 15 - 41 U/L   ALT 94 (H) 0 - 44 U/L   Alkaline Phosphatase 128 (H) 38 - 126 U/L   Total Bilirubin 12.8 (H) 0.3 - 1.2 mg/dL   GFR, Estimated >60 >60 mL/min   Anion gap 23 (H) 5 - 15  CBC      Status: Abnormal   Collection Time: 07/17/20  8:21 PM  Result Value Ref Range   WBC 8.0 4.0 - 10.5 K/uL   RBC 3.05 (L) 3.87 - 5.11 MIL/uL   Hemoglobin 12.0 12.0 - 15.0 g/dL   HCT 35.4 (L) 36.0 - 46.0 %   MCV 116.1 (H) 80.0 - 100.0 fL   MCH 39.3 (H) 26.0 - 34.0 pg   MCHC 33.9 30.0 - 36.0 g/dL   RDW 18.9 (H) 11.5 - 15.5 %   Platelets 200 150 - 400 K/uL   nRBC 0.6 (H) 0.0 - 0.2 %  TSH     Status: Abnormal   Collection Time: 07/17/20  8:21 PM  Result Value Ref Range   TSH 14.670 (H) 0.350 - 4.500 uIU/mL  I-Stat beta hCG blood, ED     Status: None   Collection Time: 07/17/20  8:26 PM  Result Value Ref Range   I-stat hCG, quantitative <5.0 <5 mIU/mL   Comment 3  Urinalysis, Routine w reflex microscopic Urine, Clean Catch     Status: Abnormal   Collection Time: 07/17/20  9:00 PM  Result Value Ref Range   Color, Urine RED (A) YELLOW   APPearance CLOUDY (A) CLEAR   Specific Gravity, Urine  1.005 - 1.030    TEST NOT REPORTED DUE TO COLOR INTERFERENCE OF URINE PIGMENT   pH  5.0 - 8.0    TEST NOT REPORTED DUE TO COLOR INTERFERENCE OF URINE PIGMENT   Glucose, UA (A) NEGATIVE mg/dL    TEST NOT REPORTED DUE TO COLOR INTERFERENCE OF URINE PIGMENT   Hgb urine dipstick (A) NEGATIVE    TEST NOT REPORTED DUE TO COLOR INTERFERENCE OF URINE PIGMENT   Bilirubin Urine (A) NEGATIVE    TEST NOT REPORTED DUE TO COLOR INTERFERENCE OF URINE PIGMENT   Ketones, ur (A) NEGATIVE mg/dL    TEST NOT REPORTED DUE TO COLOR INTERFERENCE OF URINE PIGMENT   Protein, ur (A) NEGATIVE mg/dL    TEST NOT REPORTED DUE TO COLOR INTERFERENCE OF URINE PIGMENT   Nitrite (A) NEGATIVE    TEST NOT REPORTED DUE TO COLOR INTERFERENCE OF URINE PIGMENT   Leukocytes,Ua (A) NEGATIVE    TEST NOT REPORTED DUE TO COLOR INTERFERENCE OF URINE PIGMENT   RBC / HPF >50 (H) 0 - 5 RBC/hpf   WBC, UA 6-10 0 - 5 WBC/hpf   Bacteria, UA MANY (A) NONE SEEN   Squamous Epithelial / LPF 11-20 0 - 5   Mucus PRESENT   Rapid urine drug  screen (hospital performed)     Status: None   Collection Time: 07/17/20  9:00 PM  Result Value Ref Range   Opiates NONE DETECTED NONE DETECTED   Cocaine NONE DETECTED NONE DETECTED   Benzodiazepines NONE DETECTED NONE DETECTED   Amphetamines NONE DETECTED NONE DETECTED   Tetrahydrocannabinol NONE DETECTED NONE DETECTED   Barbiturates NONE DETECTED NONE DETECTED  Resp Panel by RT-PCR (Flu A&B, Covid) Nasopharyngeal Swab     Status: None   Collection Time: 07/18/20 12:36 AM   Specimen: Nasopharyngeal Swab; Nasopharyngeal(NP) swabs in vial transport medium  Result Value Ref Range   SARS Coronavirus 2 by RT PCR NEGATIVE NEGATIVE   Influenza A by PCR NEGATIVE NEGATIVE   Influenza B by PCR NEGATIVE NEGATIVE  Vitamin B12     Status: None   Collection Time: 07/18/20  1:30 AM  Result Value Ref Range   Vitamin B-12 870 180 - 914 pg/mL  Blood gas, venous     Status: Abnormal   Collection Time: 07/18/20  1:30 AM  Result Value Ref Range   pH, Ven 7.468 (H) 7.250 - 7.430   pCO2, Ven 38.1 (L) 44.0 - 60.0 mmHg   pO2, Ven 88.6 (H) 32.0 - 45.0 mmHg   Bicarbonate 27.2 20.0 - 28.0 mmol/L   Acid-Base Excess 3.9 (H) 0.0 - 2.0 mmol/L   O2 Saturation 96.4 %   Patient temperature 37.0   Ferritin     Status: Abnormal   Collection Time: 07/18/20  1:30 AM  Result Value Ref Range   Ferritin 802 (H) 11 - 307 ng/mL  Protime-INR     Status: Abnormal   Collection Time: 07/18/20  1:32 AM  Result Value Ref Range   Prothrombin Time 15.4 (H) 11.4 - 15.2 seconds   INR 1.3 (H) 0.8 - 1.2  Bilirubin, fractionated(tot/dir/indir)     Status: Abnormal   Collection Time: 07/18/20  1:32 AM  Result Value Ref Range  Total Bilirubin 11.1 (H) 0.3 - 1.2 mg/dL   Bilirubin, Direct 5.9 (H) 0.0 - 0.2 mg/dL   Indirect Bilirubin 5.2 (H) 0.3 - 0.9 mg/dL  Ethanol     Status: None   Collection Time: 07/18/20  1:32 AM  Result Value Ref Range   Alcohol, Ethyl (B) <10 <10 mg/dL  Magnesium     Status: Abnormal   Collection  Time: 07/18/20  1:32 AM  Result Value Ref Range   Magnesium 1.5 (L) 1.7 - 2.4 mg/dL  Phosphorus     Status: Abnormal   Collection Time: 07/18/20  1:32 AM  Result Value Ref Range   Phosphorus 2.0 (L) 2.5 - 4.6 mg/dL  CK     Status: Abnormal   Collection Time: 07/18/20  1:32 AM  Result Value Ref Range   Total CK 5,734 (H) 38 - 234 U/L  Acetaminophen level     Status: Abnormal   Collection Time: 07/18/20  1:32 AM  Result Value Ref Range   Acetaminophen (Tylenol), Serum <10 (L) 10 - 30 ug/mL  Lactate dehydrogenase     Status: Abnormal   Collection Time: 07/18/20  1:32 AM  Result Value Ref Range   LDH 687 (H) 98 - 192 U/L  Fibrinogen     Status: None   Collection Time: 07/18/20  1:32 AM  Result Value Ref Range   Fibrinogen 272 210 - 475 mg/dL  Hepatitis panel, acute     Status: None   Collection Time: 07/18/20  1:39 AM  Result Value Ref Range   Hepatitis B Surface Ag NON REACTIVE NON REACTIVE   HCV Ab NON REACTIVE NON REACTIVE   Hep A IgM NON REACTIVE NON REACTIVE   Hep B C IgM NON REACTIVE NON REACTIVE  Folate, serum, performed at [facility] lab     Status: Abnormal   Collection Time: 07/18/20  1:39 AM  Result Value Ref Range   Folate 2.7 (L) >5.9 ng/mL  Lactic acid, plasma     Status: Abnormal   Collection Time: 07/18/20  1:39 AM  Result Value Ref Range   Lactic Acid, Venous 4.2 (HH) 0.5 - 1.9 mmol/L  CBC with Differential/Platelet     Status: Abnormal   Collection Time: 07/18/20  1:39 AM  Result Value Ref Range   WBC 7.8 4.0 - 10.5 K/uL   RBC 2.50 (L) 3.87 - 5.11 MIL/uL   Hemoglobin 10.0 (L) 12.0 - 15.0 g/dL   HCT 28.6 (L) 36.0 - 46.0 %   MCV 114.4 (H) 80.0 - 100.0 fL   MCH 40.0 (H) 26.0 - 34.0 pg   MCHC 35.0 30.0 - 36.0 g/dL   RDW 18.8 (H) 11.5 - 15.5 %   Platelets 169 150 - 400 K/uL   nRBC 0.4 (H) 0.0 - 0.2 %   Neutrophils Relative % 76 %   Neutro Abs 5.9 1.7 - 7.7 K/uL   Lymphocytes Relative 16 %   Lymphs Abs 1.2 0.7 - 4.0 K/uL   Monocytes Relative 7 %    Monocytes Absolute 0.6 0.1 - 1.0 K/uL   Eosinophils Relative 0 %   Eosinophils Absolute 0.0 0.0 - 0.5 K/uL   Basophils Relative 0 %   Basophils Absolute 0.0 0.0 - 0.1 K/uL   WBC Morphology TOXIC GRANULATION    Immature Granulocytes 1 %   Abs Immature Granulocytes 0.04 0.00 - 0.07 K/uL   Polychromasia PRESENT    Target Cells PRESENT   Lipid panel     Status: Abnormal  Collection Time: 07/18/20  1:39 AM  Result Value Ref Range   Cholesterol 139 0 - 200 mg/dL   Triglycerides 228 (H) <150 mg/dL   HDL 5 (L) >40 mg/dL   Total CHOL/HDL Ratio 27.8 RATIO   VLDL 46 (H) 0 - 40 mg/dL   LDL Cholesterol 88 0 - 99 mg/dL  Ammonia     Status: Abnormal   Collection Time: 07/18/20  5:00 AM  Result Value Ref Range   Ammonia 44 (H) 9 - 35 umol/L  Magnesium     Status: Abnormal   Collection Time: 07/18/20  5:00 AM  Result Value Ref Range   Magnesium 1.5 (L) 1.7 - 2.4 mg/dL  Phosphorus     Status: Abnormal   Collection Time: 07/18/20  5:00 AM  Result Value Ref Range   Phosphorus 2.0 (L) 2.5 - 4.6 mg/dL  CBC WITH DIFFERENTIAL     Status: Abnormal   Collection Time: 07/18/20  5:00 AM  Result Value Ref Range   WBC 7.4 4.0 - 10.5 K/uL   RBC 2.46 (L) 3.87 - 5.11 MIL/uL   Hemoglobin 9.7 (L) 12.0 - 15.0 g/dL   HCT 29.0 (L) 36.0 - 46.0 %   MCV 117.9 (H) 80.0 - 100.0 fL   MCH 39.4 (H) 26.0 - 34.0 pg   MCHC 33.4 30.0 - 36.0 g/dL   RDW 19.6 (H) 11.5 - 15.5 %   Platelets 155 150 - 400 K/uL   nRBC 0.4 (H) 0.0 - 0.2 %   Neutrophils Relative % 74 %   Neutro Abs 5.4 1.7 - 7.7 K/uL   Lymphocytes Relative 19 %   Lymphs Abs 1.4 0.7 - 4.0 K/uL   Monocytes Relative 7 %   Monocytes Absolute 0.5 0.1 - 1.0 K/uL   Eosinophils Relative 0 %   Eosinophils Absolute 0.0 0.0 - 0.5 K/uL   Basophils Relative 0 %   Basophils Absolute 0.0 0.0 - 0.1 K/uL   WBC Morphology TOXIC GRANULATION    Immature Granulocytes 0 %   Abs Immature Granulocytes 0.03 0.00 - 0.07 K/uL   Polychromasia PRESENT    Basophilic Stippling  PRESENT    Target Cells PRESENT   TSH     Status: Abnormal   Collection Time: 07/18/20  5:00 AM  Result Value Ref Range   TSH 24.526 (H) 0.350 - 4.500 uIU/mL  Comprehensive metabolic panel     Status: Abnormal   Collection Time: 07/18/20  5:00 AM  Result Value Ref Range   Sodium 137 135 - 145 mmol/L   Potassium 3.2 (L) 3.5 - 5.1 mmol/L   Chloride 98 98 - 111 mmol/L   CO2 23 22 - 32 mmol/L   Glucose, Bld 101 (H) 70 - 99 mg/dL   BUN 11 6 - 20 mg/dL   Creatinine, Ser 0.69 0.44 - 1.00 mg/dL   Calcium 7.6 (L) 8.9 - 10.3 mg/dL   Total Protein 5.6 (L) 6.5 - 8.1 g/dL   Albumin 2.7 (L) 3.5 - 5.0 g/dL   AST 217 (H) 15 - 41 U/L   ALT 73 (H) 0 - 44 U/L   Alkaline Phosphatase 95 38 - 126 U/L   Total Bilirubin 10.7 (H) 0.3 - 1.2 mg/dL   GFR, Estimated >60 >60 mL/min   Anion gap 16 (H) 5 - 15  Protime-INR     Status: Abnormal   Collection Time: 07/18/20  5:00 AM  Result Value Ref Range   Prothrombin Time 15.7 (H) 11.4 - 15.2 seconds  INR 1.3 (H) 0.8 - 1.2  Prealbumin     Status: None   Collection Time: 07/18/20  5:00 AM  Result Value Ref Range   Prealbumin 22.7 18 - 38 mg/dL  T4, free     Status: None   Collection Time: 07/18/20  5:00 AM  Result Value Ref Range   Free T4 0.97 0.61 - 1.12 ng/dL  Iron and TIBC     Status: Abnormal   Collection Time: 07/18/20  5:00 AM  Result Value Ref Range   Iron 191 (H) 28 - 170 ug/dL   TIBC 216 (L) 250 - 450 ug/dL   Saturation Ratios 88 (H) 10.4 - 31.8 %   UIBC 25 ug/dL  Lactic acid, plasma     Status: Abnormal   Collection Time: 07/18/20  7:47 AM  Result Value Ref Range   Lactic Acid, Venous 2.6 (HH) 0.5 - 1.9 mmol/L  Lactic acid, plasma     Status: Abnormal   Collection Time: 07/18/20 10:52 AM  Result Value Ref Range   Lactic Acid, Venous 2.5 (HH) 0.5 - 1.9 mmol/L    Recent Results (from the past 240 hour(s))  Resp Panel by RT-PCR (Flu A&B, Covid) Nasopharyngeal Swab     Status: None   Collection Time: 07/18/20 12:36 AM   Specimen:  Nasopharyngeal Swab; Nasopharyngeal(NP) swabs in vial transport medium  Result Value Ref Range Status   SARS Coronavirus 2 by RT PCR NEGATIVE NEGATIVE Final    Comment: (NOTE) SARS-CoV-2 target nucleic acids are NOT DETECTED.  The SARS-CoV-2 RNA is generally detectable in upper respiratory specimens during the acute phase of infection. The lowest concentration of SARS-CoV-2 viral copies this assay can detect is 138 copies/mL. A negative result does not preclude SARS-Cov-2 infection and should not be used as the sole basis for treatment or other patient management decisions. A negative result may occur with  improper specimen collection/handling, submission of specimen other than nasopharyngeal swab, presence of viral mutation(s) within the areas targeted by this assay, and inadequate number of viral copies(<138 copies/mL). A negative result must be combined with clinical observations, patient history, and epidemiological information. The expected result is Negative.  Fact Sheet for Patients:  EntrepreneurPulse.com.au  Fact Sheet for Healthcare Providers:  IncredibleEmployment.be  This test is no t yet approved or cleared by the [place] FDA and  has been authorized for detection and/or diagnosis of SARS-CoV-2 by FDA under an Emergency Use Authorization (EUA). This EUA will remain  in effect (meaning this test can be used) for the duration of the COVID-19 declaration under Section 564(b)(1) of the Act, 21 U.S.C.section 360bbb-3(b)(1), unless the authorization is terminated  or revoked sooner.       Influenza A by PCR NEGATIVE NEGATIVE Final   Influenza B by PCR NEGATIVE NEGATIVE Final    Comment: (NOTE) The Xpert Xpress SARS-CoV-2/FLU/RSV plus assay is intended as an aid in the diagnosis of influenza from Nasopharyngeal swab specimens and should not be used as a sole basis for treatment. Nasal washings and aspirates are unacceptable for  Xpert Xpress SARS-CoV-2/FLU/RSV testing.  Fact Sheet for Patients: EntrepreneurPulse.com.au  Fact Sheet for Healthcare Providers: IncredibleEmployment.be  This test is not yet approved or cleared by the [place] FDA and has been authorized for detection and/or diagnosis of SARS-CoV-2 by FDA under an Emergency Use Authorization (EUA). This EUA will remain in effect (meaning this test can be used) for the duration of the COVID-19 declaration under Section 564(b)(1) of the Act, 21  U.S.C. section 360bbb-3(b)(1), unless the authorization is terminated or revoked.  Performed at [facility], [place] [address] [place], [place] [address]      Radiology Studies: CT Abdomen Pelvis W Contrast  Result Date: 07/17/2020 CLINICAL DATA:  Abdominal pain and vomiting EXAM: CT ABDOMEN AND PELVIS WITH CONTRAST TECHNIQUE: Multidetector CT imaging of the abdomen and pelvis was performed using the standard protocol following bolus administration of intravenous contrast. CONTRAST:  158mL OMNIPAQUE IOHEXOL 300 MG/ML  SOLN COMPARISON:  April 06, 2020 FINDINGS: Lower chest: The visualized heart size within normal limits. No pericardial fluid/thickening. No hiatal hernia. The visualized portions of the lungs are clear. Hepatobiliary: There is diffuse low density seen throughout the liver parenchyma with mild hepatomegaly.The main portal vein is patent. The gallbladder is mildly fluid-filled and distended. No intrahepatic biliary ductal dilatation is noted. Pancreas: Unremarkable. No pancreatic ductal dilatation or surrounding inflammatory changes. Spleen: Normal in size without focal abnormality. Adrenals/Urinary Tract: Both adrenal glands appear normal. Within the lower pole of the right kidney again noted is a 2.1 cm fat containing lesion, likely angiomyolipoma. There is a 3 mm calculus in the lower pole of the right kidney. No hydronephrosis. The bladder is  partially decompressed. Stomach/Bowel: The stomach, small bowel, and colon are normal in appearance. No inflammatory changes, wall thickening, or obstructive findings.The appendix is normal. Vascular/Lymphatic: There are no enlarged mesenteric, retroperitoneal, or pelvic lymph nodes. Scattered mild aortic atherosclerosis is seen. Reproductive: Multiple heterogeneously enhancing uterine fibroids are again identified with the large uterus. Some of which appear to be partially calcified. Other: No evidence of abdominal wall mass or hernia. Musculoskeletal: No acute or significant osseous findings. IMPRESSION: Hepatic steatosis. Mildly distended fluid-filled gallbladder, however no definite evidence of surrounding inflammatory changes. Stable 2 cm right-sided renal angiomyolipoma Nonobstructing right renal calculus. Enlarged uterus with multiple uterine fibroids. Aortic Atherosclerosis (ICD10-I70.0). Electronically Signed   By: Prudencio Pair M.D.   On: 07/17/2020 22:51   US Abdomen Limited RUQ (LIVER/GB)  Result Date: 07/18/2020 CLINICAL DATA:  Abdominal pain increased LFTs EXAM: ULTRASOUND ABDOMEN LIMITED RIGHT UPPER QUADRANT COMPARISON:  CT prior day FINDINGS: Gallbladder: Layering slightly hyperdense sludge/small stones are seen. No sonographic Murphy sign noted by sonographer. Common bile duct: Diameter: 2 mm Liver: Increased echotexture seen throughout with hepatomegaly. No focal abnormality or biliary ductal dilatation. Portal vein is patent on color Doppler imaging with normal direction of blood flow towards the liver. Other: None. IMPRESSION: Layering gallbladder sludge/stones. No definite evidence of acute cholecystitis. Hepatic steatosis and mild hepatomegaly Electronically Signed   By: Prudencio Pair M.D.   On: 07/18/2020 02:45   US Abdomen Limited RUQ (LIVER/GB)  Final Result    CT Abdomen Pelvis W Contrast  Final Result      Scheduled Meds: . amLODipine  5 mg Oral Daily  . folic acid  1 mg  Oral Daily  . [START ON 07/19/2020] influenza vac split quadrivalent PF  0.5 mL Intramuscular Tomorrow-1000  . levothyroxine  75 mcg Oral Q0600  . thiamine injection  100 mg Intravenous Daily  . venlafaxine XR  75 mg Oral Daily   PRN Meds: morphine injection, ondansetron **OR** ondansetron (ZOFRAN) IV Continuous Infusions: . sodium chloride 150 mL/hr at 07/18/20 1022  . cefTRIAXone (ROCEPHIN)  IV 1 g (07/18/20 0350)  . potassium PHOSPHATE IVPB (in mmol)       LOS: 0 days  Time spent: Greater than 50% of the 35 minute visit was spent in counseling/coordination of care for  the patient as laid out in the A&P.   Dwyane Dee, MD [facility] 07/18/2020, 1:24 PM

## 2020-07-18 NOTE — ED Notes (Signed)
US at bedside

## 2020-07-18 NOTE — Assessment & Plan Note (Addendum)
-   no s/s ascending cholangitis at this time (patient afebrile and no leukocytosis) - GI consulted as well, follow up recommendations - LFTs elevated; no AP elevation or biliary dilatation so not felt to be obstructive per GI. (Ultrasound noted with cholelithiasis and sludge, no evidence of acute cholecystitis) - night of 12/12 patient developed more pain and nausea; HIDA unable to be done due to TB>4.5 per radiology; will try MRCP to evaluate CBD for any evidence of obstruction or GB pathology - NPO again for now; continue pain control  - lactic considered elevated in setting of abnormal hepatic function; does not appear septic or toxic - patient on Rocephin for possible UTI coverage which will also cover for now as well - trend LFTs - if hemochromatosis work-up negative, may still need liver biopsy per GI -Follow-up MRCP

## 2020-07-18 NOTE — Plan of Care (Signed)

## 2020-07-18 NOTE — Consult Note (Signed)
Referring Provider: [facility] Primary Care Physician:  Pcp, No Primary Gastroenterologist:  Althia Forts  Reason for Consultation:  Transaminitis  HPI: Judy Meyer is a 59 y.o. female with past medical history of hypothyroidism presenting for consultation of transaminitis.  Patient presented to the ED yesterday due to nausea and vomiting, as well as yellowing of the skin and eyes, which she states has been ongoing for the past 3 to 4 weeks.  She was admitted for 1 day in August with nausea/vomiting but did not have hyperbilirubinemia at that time.  She states she has not been able to eat or drink due to persistent nausea and vomiting.  Denies any hematemesis.  She denies weight loss despite poor oral intake.  Reports mild epigastric, RUQ, and suprapubic abdominal pain.  Denies any GERD or dysphagia.  She also reports having diarrhea most recently.  Denies any melena or hematochezia.   Denies family history of liver disease or gastrointestinal malignancy.  States she drinks 1 glass of vodka on Saturday and one on Sunday each week but denies other alcohol use.  Denies illicit drug use.  Past Medical History:  Diagnosis Date  . Hypertension   . Hypothyroidism     History reviewed. No pertinent surgical history.  Prior to Admission medications   Medication Sig Start Date End Date Taking? Authorizing Provider  albuterol (VENTOLIN HFA) 108 (90 Base) MCG/ACT inhaler Inhale 2 puffs into the lungs 4 (four) times daily as needed for wheezing or shortness of breath. 12/20/19  Yes [provider]  amLODipine (NORVASC) 5 MG tablet Take 1 tablet (5 mg total) by mouth daily. 04/08/20 05/08/20 Yes Guilford Shi, MD  eszopiclone (LUNESTA) 1 MG TABS tablet Take 1 mg by mouth at bedtime as needed for sleep. 02/11/20  Yes [provider]  Galcanezumab-gnlm 120 MG/ML SOAJ Inject 120 mLs into the skin every 30 (thirty) days.   Yes [provider]  levothyroxine (SYNTHROID, LEVOTHROID)  75 MCG tablet Take 75 mcg by mouth daily before breakfast.   Yes [provider]  norethindrone-ethinyl estradiol (NORTREL 7/7/7) 0.5/0.75/1-35 MG-MCG tablet Take 1 tablet by mouth daily.   Yes [provider]  rosuvastatin (CRESTOR) 5 MG tablet Take 5 mg by mouth daily.   Yes [provider]  venlafaxine XR (EFFEXOR-XR) 75 MG 24 hr capsule Take 75 mg by mouth daily.   Yes [provider]    Scheduled Meds: . amLODipine  5 mg Oral Daily  . folic acid  1 mg Oral Daily  . [START ON 07/19/2020] influenza vac split quadrivalent PF  0.5 mL Intramuscular Tomorrow-1000  . levothyroxine  75 mcg Oral Q0600  . thiamine injection  100 mg Intravenous Daily  . venlafaxine XR  75 mg Oral Daily   Continuous Infusions: . sodium chloride 150 mL/hr at 07/18/20 1022  . cefTRIAXone (ROCEPHIN)  IV 1 g (07/18/20 0350)  . potassium PHOSPHATE IVPB (in mmol)     PRN Meds:.morphine injection, ondansetron **OR** ondansetron (ZOFRAN) IV  Allergies as of 07/17/2020 - Review Complete 07/17/2020  Allergen Reaction Noted  . Compazine [prochlorperazine edisylate] Anaphylaxis 07/11/2018  . Periactin [cyproheptadine] Anaphylaxis 07/11/2018  . Cyclobenzaprine  07/11/2018  . Haloperidol and related  07/11/2018  . Metoclopramide Other (See Comments) 07/11/2018  . Other Other (See Comments) 05/03/2020  . Prochlorperazine Other (See Comments) 05/03/2020    Family History  Problem Relation Age of Onset  . Hypertension Other     Social History   Socioeconomic History  . Marital  status: Single    Spouse name: Not on file  . Number of children: Not on file  . Years of education: Not on file  . Highest education level: Not on file  Occupational History  . Not on file  Tobacco Use  . Smoking status: Never Smoker  . Smokeless tobacco: Never Used  Vaping Use  . Vaping Use: Never used  Substance and Sexual Activity  . Alcohol use: Yes    Comment: occasionally  . Drug use:  Not Currently  . Sexual activity: Yes  Other Topics Concern  . Not on file  Social History Narrative  . Not on file   Social Determinants of Health   Financial Resource Strain: Not on file  Food Insecurity: Not on file  Transportation Needs: Not on file  Physical Activity: Not on file  Stress: Not on file  Social Connections: Not on file  Intimate Partner Violence: Not on file    Review of Systems: Review of Systems  Constitutional: Positive for weight loss. Negative for chills and fever.  HENT: Negative for hearing loss and tinnitus.   Eyes: Negative for pain and redness.  Respiratory: Negative for cough and shortness of breath.   Cardiovascular: Negative for chest pain and palpitations.  Gastrointestinal: Positive for abdominal pain, diarrhea, nausea and vomiting. Negative for blood in stool, constipation, heartburn and melena.  Genitourinary: Negative for flank pain and hematuria.  Musculoskeletal: Negative for falls and joint pain.  Skin: Negative for itching and rash.       +yellowing   Neurological: Negative for seizures and loss of consciousness.  Endo/Heme/Allergies: Negative for polydipsia. Does not bruise/bleed easily.  Psychiatric/Behavioral: Negative for memory loss. The patient is not nervous/anxious.     Physical Exam: Vital signs: Vitals:   07/18/20 0300 07/18/20 0625  BP: (!) 152/99 (!) 143/93  Pulse: 79 85  Resp: 18   Temp: 98.6 F (37 C) 98.3 F (36.8 C)  SpO2: 97% 97%     Physical Exam Vitals reviewed.  Constitutional:      General: She is not in acute distress. HENT:     Head: Normocephalic and atraumatic.     Nose: Nose normal. No congestion.     Mouth/Throat:     Mouth: Mucous membranes are moist.     Pharynx: Oropharynx is clear.  Eyes:     General: Scleral icterus present.     Extraocular Movements: Extraocular movements intact.  Cardiovascular:     Rate and Rhythm: Normal rate and regular rhythm.     Pulses: Normal pulses.   Pulmonary:     Effort: Pulmonary effort is normal. No respiratory distress.     Breath sounds: Normal breath sounds.  Abdominal:     General: Bowel sounds are normal. There is no distension.     Palpations: Abdomen is soft. There is no mass.     Tenderness: There is abdominal tenderness (mild, RUQ). There is no guarding or rebound.     Hernia: No hernia is present.  Musculoskeletal:        General: No swelling or tenderness.     Cervical back: Normal range of motion and neck supple.  Skin:    General: Skin is warm and dry.     Coloration: Skin is jaundiced.  Neurological:     General: No focal deficit present.     Mental Status: She is oriented to person, place, and time. She is lethargic.  Psychiatric:        Mood  and Affect: Mood normal.        Behavior: Behavior normal. Behavior is cooperative.      GI:  Lab Results: Recent Labs    07/17/20 2021 07/18/20 0139 07/18/20 0500  WBC 8.0 7.8 7.4  HGB 12.0 10.0* 9.7*  HCT 35.4* 28.6* 29.0*  PLT 200 169 155   BMET Recent Labs    07/17/20 2021 07/18/20 0500  NA 136 137  K 3.0* 3.2*  CL 93* 98  CO2 20* 23  GLUCOSE 137* 101*  BUN 13 11  CREATININE 0.76 0.69  CALCIUM 8.5* 7.6*   LFT Recent Labs    07/18/20 0132 07/18/20 0500  PROT  --  5.6*  ALBUMIN  --  2.7*  AST  --  217*  ALT  --  73*  ALKPHOS  --  95  BILITOT 11.1* 10.7*  BILIDIR 5.9*  --   IBILI 5.2*  --    PT/INR Recent Labs    07/18/20 0132 07/18/20 0500  LABPROT 15.4* 15.7*  INR 1.3* 1.3*     Studies/Results: CT Abdomen Pelvis W Contrast  Result Date: 07/17/2020 CLINICAL DATA:  Abdominal pain and vomiting EXAM: CT ABDOMEN AND PELVIS WITH CONTRAST TECHNIQUE: Multidetector CT imaging of the abdomen and pelvis was performed using the standard protocol following bolus administration of intravenous contrast. CONTRAST:  154mL OMNIPAQUE IOHEXOL 300 MG/ML  SOLN COMPARISON:  April 06, 2020 FINDINGS: Lower chest: The visualized heart size within  normal limits. No pericardial fluid/thickening. No hiatal hernia. The visualized portions of the lungs are clear. Hepatobiliary: There is diffuse low density seen throughout the liver parenchyma with mild hepatomegaly.The main portal vein is patent. The gallbladder is mildly fluid-filled and distended. No intrahepatic biliary ductal dilatation is noted. Pancreas: Unremarkable. No pancreatic ductal dilatation or surrounding inflammatory changes. Spleen: Normal in size without focal abnormality. Adrenals/Urinary Tract: Both adrenal glands appear normal. Within the lower pole of the right kidney again noted is a 2.1 cm fat containing lesion, likely angiomyolipoma. There is a 3 mm calculus in the lower pole of the right kidney. No hydronephrosis. The bladder is partially decompressed. Stomach/Bowel: The stomach, small bowel, and colon are normal in appearance. No inflammatory changes, wall thickening, or obstructive findings.The appendix is normal. Vascular/Lymphatic: There are no enlarged mesenteric, retroperitoneal, or pelvic lymph nodes. Scattered mild aortic atherosclerosis is seen. Reproductive: Multiple heterogeneously enhancing uterine fibroids are again identified with the large uterus. Some of which appear to be partially calcified. Other: No evidence of abdominal wall mass or hernia. Musculoskeletal: No acute or significant osseous findings. IMPRESSION: Hepatic steatosis. Mildly distended fluid-filled gallbladder, however no definite evidence of surrounding inflammatory changes. Stable 2 cm right-sided renal angiomyolipoma Nonobstructing right renal calculus. Enlarged uterus with multiple uterine fibroids. Aortic Atherosclerosis (ICD10-I70.0). Electronically Signed   By: Prudencio Pair M.D.   On: 07/17/2020 22:51   US Abdomen Limited RUQ (LIVER/GB)  Result Date: 07/18/2020 CLINICAL DATA:  Abdominal pain increased LFTs EXAM: ULTRASOUND ABDOMEN LIMITED RIGHT UPPER QUADRANT COMPARISON:  CT prior day FINDINGS:  Gallbladder: Layering slightly hyperdense sludge/small stones are seen. No sonographic Murphy sign noted by sonographer. Common bile duct: Diameter: 2 mm Liver: Increased echotexture seen throughout with hepatomegaly. No focal abnormality or biliary ductal dilatation. Portal vein is patent on color Doppler imaging with normal direction of blood flow towards the liver. Other: None. IMPRESSION: Layering gallbladder sludge/stones. No definite evidence of acute cholecystitis. Hepatic steatosis and mild hepatomegaly Electronically Signed   By: Ebony Cargo.D.  On: 07/18/2020 02:45    Impression: Abnormal LFTs: possible hemochromatosis. Alcoholic hepatitis less likely.  Hepatic discriminant function <32. -T. Bili 10.7/ AST 217/ ALT 73/ ALP 95 today.  Yesterday, T. Bili 12.8 with AST <5, ALT 94, ALP 128 -PT 15.7/ INR 1.3 -Elevated iron saturation (88%), iron (191), decreased TIBC (216), and elevated ferritin (802)  Plan: Hemochromatosis DNA-PCR ordered.    Ceruloplasmin, ANA, AMA, ASMA, alpha-1 antitrypsin ordered to rule out underlying causes of liver disease.  Continue to trend LFTs.  [facility] will follow.   LOS: 0 days   Salley Slaughter  PA-C 07/18/2020, 1:38 PM  Contact #  [phone]

## 2020-07-18 NOTE — Assessment & Plan Note (Signed)
-   Continue amlodipine ?

## 2020-07-18 NOTE — Assessment & Plan Note (Addendum)
-  TSH elevated. (14.67>>24.52) - check FT4 (normal, 0.97) and TT3 (normal) - likely elevated TSH 2/2 acute illness; no obvious signs of myxedema at this time - repeat TSH in 4-6 weeks

## 2020-07-18 NOTE — Assessment & Plan Note (Addendum)
-  B12 is normal, folate low -Check iron panel as well in case mixed: Iron 191 (elevated), Sat 88% (high), elevated ferritin (802), see iron excess -Continue on folic supplement

## 2020-07-18 NOTE — Progress Notes (Signed)
Initial Nutrition Assessment  DOCUMENTATION CODES:   Non-severe (moderate) malnutrition in context of acute illness/injury  INTERVENTION:  Monitor magnesium, potassium, and phosphorus daily for at least 3 days, MD to replete as needed, as pt is at risk for refeeding syndrome.  -Ensure Enlive po BID, each supplement provides 350 kcal and 20 grams of protein  NUTRITION DIAGNOSIS:   Moderate Malnutrition related to acute illness,nausea,vomiting as evidenced by moderate fat depletion,moderate muscle depletion,energy intake < or equal to 75% for > or equal to 1 month.  GOAL:   Patient will meet greater than or equal to 90% of their needs  MONITOR:   PO intake,Supplement acceptance,Weight trends,I & O's,Labs  REASON FOR ASSESSMENT:   Consult Assessment of nutrition requirement/status  ASSESSMENT:   59 y.o. female with medical history significant of fibroids, HTN, HLD, depression,    Admitted for hyperbilirubinemia, dehydration, hypokalemia  Per GI note: CT abdomen and pelvis showed hepatic steatosis, mildly distended fluid-filled gallbladder. Ultrasound showed CBD of 2 mm, layering gallbladder stone/sludge, no cholecystitis, hepatic steatosis and mild hepatomegaly   Patient in room. Pt states she is still having nausea today but states the nausea medication is helping.   Pt states she has had N/V since 4 months ago when she was admitted for gastroenteritis. Pt states over the past month the N/V worsened. States her last meal that she tolerated was about a week ago.   Pt is unable to tell me how much weight loss she has had. Weight records do not show weight loss. May need to weigh again to confirm.   Labs reviewed. Medications: Folic acid, IV Thiamine, IV Mg sulfate, IV KCl, K Phos, IV Zofran  NUTRITION - FOCUSED PHYSICAL EXAM:  Flowsheet Row Most Recent Value  Orbital Region Mild depletion  Upper Arm Region Moderate depletion  Thoracic and Lumbar Region Unable to assess   Buccal Region --  [masked]  Temple Region Mild depletion  Clavicle Bone Region Moderate depletion  Clavicle and Acromion Bone Region Moderate depletion  Scapular Bone Region Mild depletion  Dorsal Hand Moderate depletion  Patellar Region Unable to assess  Anterior Thigh Region Unable to assess  Posterior Calf Region Unable to assess  Edema (RD Assessment) None       Diet Order:   Diet Order            Diet Heart Room service appropriate? Yes; Fluid consistency: Thin  Diet effective now                 EDUCATION NEEDS:   No education needs have been identified at this time  Skin:  Skin Assessment: Reviewed RN Assessment  Last BM:  PTA  Height:   Ht Readings from Last 1 Encounters:  07/17/20 5\' 6"  (1.676 m)    Weight:   Wt Readings from Last 1 Encounters:  07/17/20 63.5 kg    BMI:  Body mass index is 22.6 kg/m.  Estimated Nutritional Needs:   Kcal:  1800-2000  Protein:  75-85g  Fluid:  2L/day   Clayton Bibles, MS, RD, LDN Inpatient Clinical Dietitian Contact information available via Amion

## 2020-07-18 NOTE — Assessment & Plan Note (Addendum)
-   Urine culture negative  - patient on empiric treatment with Rocephin; acceptable given possible GB pathology for now

## 2020-07-18 NOTE — Assessment & Plan Note (Signed)
-  Replete and recheck as needed 

## 2020-07-18 NOTE — Hospital Course (Addendum)
Ms. Gibbon is a 59  yo female with PMH hypothyroidism, hypertension, uterine fibroids, hyperlipidemia, depression who presented to the ER with worsening nausea and vomiting.  She endorsed a global decline over the past 4 months and has been acutely declining over the past several days to weeks.  She has noticed yellowing of her skin, decreased strength, and weight loss but has not been weighing herself. She denied any excessive alcohol use, tobacco use, or illicit drug use on admission.  Also does not take any significant amounts of Tylenol.  On work-up she was found to have multiple lab abnormalities.  Also underwent ultrasound abdomen which showed gallbladder stones and sludge. LFTs were elevated and she was also found to have iron excess.  GI was consulted on admission as well.

## 2020-07-19 DIAGNOSIS — E440 Moderate protein-calorie malnutrition: Secondary | ICD-10-CM | POA: Insufficient documentation

## 2020-07-19 DIAGNOSIS — R945 Abnormal results of liver function studies: Secondary | ICD-10-CM

## 2020-07-19 LAB — CBC WITH DIFFERENTIAL/PLATELET
Abs Immature Granulocytes: 0.06 10*3/uL (ref 0.00–0.07)
Basophils Absolute: 0 10*3/uL (ref 0.0–0.1)
Basophils Relative: 0 %
Eosinophils Absolute: 0 10*3/uL (ref 0.0–0.5)
Eosinophils Relative: 0 %
HCT: 27.6 % — ABNORMAL LOW (ref 36.0–46.0)
Hemoglobin: 9 g/dL — ABNORMAL LOW (ref 12.0–15.0)
Immature Granulocytes: 1 %
Lymphocytes Relative: 26 %
Lymphs Abs: 1.6 10*3/uL (ref 0.7–4.0)
MCH: 40 pg — ABNORMAL HIGH (ref 26.0–34.0)
MCHC: 32.6 g/dL (ref 30.0–36.0)
MCV: 122.7 fL — ABNORMAL HIGH (ref 80.0–100.0)
Monocytes Absolute: 0.4 10*3/uL (ref 0.1–1.0)
Monocytes Relative: 6 %
Neutro Abs: 4.1 10*3/uL (ref 1.7–7.7)
Neutrophils Relative %: 67 %
Platelets: 147 10*3/uL — ABNORMAL LOW (ref 150–400)
RBC: 2.25 MIL/uL — ABNORMAL LOW (ref 3.87–5.11)
RDW: 19 % — ABNORMAL HIGH (ref 11.5–15.5)
WBC: 6.2 10*3/uL (ref 4.0–10.5)
nRBC: 0.8 % — ABNORMAL HIGH (ref 0.0–0.2)

## 2020-07-19 LAB — COMPREHENSIVE METABOLIC PANEL
ALT: 64 U/L — ABNORMAL HIGH (ref 0–44)
AST: 165 U/L — ABNORMAL HIGH (ref 15–41)
Albumin: 2.4 g/dL — ABNORMAL LOW (ref 3.5–5.0)
Alkaline Phosphatase: 85 U/L (ref 38–126)
Anion gap: 14 (ref 5–15)
BUN: 10 mg/dL (ref 6–20)
CO2: 23 mmol/L (ref 22–32)
Calcium: 7.2 mg/dL — ABNORMAL LOW (ref 8.9–10.3)
Chloride: 98 mmol/L (ref 98–111)
Creatinine, Ser: 0.63 mg/dL (ref 0.44–1.00)
GFR, Estimated: >60 mL/min (ref >60)
Glucose, Bld: 70 mg/dL (ref 70–99)
Potassium: 3.4 mmol/L — ABNORMAL LOW (ref 3.5–5.1)
Sodium: 135 mmol/L (ref 135–145)
Total Bilirubin: 8.3 mg/dL — ABNORMAL HIGH (ref 0.3–1.2)
Total Protein: 5.1 g/dL — ABNORMAL LOW (ref 6.5–8.1)

## 2020-07-19 LAB — URINE CULTURE: Culture: NO GROWTH

## 2020-07-19 LAB — MAGNESIUM: Magnesium: 2.3 mg/dL (ref 1.7–2.4)

## 2020-07-19 LAB — T3: T3, Total: 83 ng/dL (ref 71–180)

## 2020-07-19 LAB — ANTI-SMOOTH MUSCLE ANTIBODY, IGG: F-Actin IgG: 5 Units (ref 0–19)

## 2020-07-19 LAB — ANA W/REFLEX IF POSITIVE: Anti Nuclear Antibody (ANA): NEGATIVE

## 2020-07-19 LAB — LIPASE, BLOOD: Lipase: 35 U/L (ref 11–51)

## 2020-07-19 LAB — MITOCHONDRIAL ANTIBODIES: Mitochondrial M2 Ab, IgG: <20 Units (ref 0.0–20.0)

## 2020-07-19 MED ORDER — THIAMINE HCL 100 MG PO TABS
100.0000 mg | ORAL_TABLET | Freq: Every day | ORAL | Status: DC
  Administered 2020-07-20 – 2020-08-06 (×18): 100 mg via ORAL
  Filled 2020-07-19 (×18): qty 1

## 2020-07-19 NOTE — Progress Notes (Signed)
PROGRESS NOTE    Judy Meyer   GHW:[id number]  DOB: 21-Sep-1960  DOA: 07/17/2020     1  PCP: Pcp, No  CC: jaundice, abd pain, N/V, weakness, weight loss  Hospital Course: Judy Meyer is a 59  yo female with PMH hypothyroidism, hypertension, uterine fibroids, hyperlipidemia, depression who presented to the ER with worsening nausea and vomiting.  She endorsed a global decline over the past 4 months and has been acutely declining over the past several days to weeks.  She has noticed yellowing of her skin, decreased strength, and weight loss but has not been weighing herself. She denied any excessive alcohol use, tobacco use, or illicit drug use on admission.  Also does not take any significant amounts of Tylenol.  On work-up she was found to have multiple lab abnormalities.  Also underwent ultrasound abdomen which showed gallbladder stones and sludge. LFTs were elevated and she was also found to have iron excess.  GI was consulted on admission as well.    Interval History:  Boyfriend bedside this am also. No events overnight. Still having RUQ abdominal pain but denies vomiting. Feeling overall a little better; labs had improved some too.    Old records reviewed in assessment of this patient  ROS: Constitutional: positive for anorexia, fatigue, malaise and weight loss, Respiratory: negative for cough, Cardiovascular: negative for chest pain, Gastrointestinal: positive for abdominal pain, jaundice, nausea and vomiting, negative for melena and Neurological: positive for weakness, negative for memory problems and confusion  Assessment & Plan: * Abnormal liver function - no s/s ascending cholangitis at this time (patient afebrile and no leukocytosis) - GI consulted as well, follow up recommendations - LFTs elevated; no AP elevation or biliary dilatation so not felt to be obstructive per GI. (Ultrasound noted with cholelithiasis and sludge, no evidence of acute cholecystitis) - continue  diet (advanced per GI) for now and anti-emetics - lactic considered elevated in setting of abnormal hepatic function; does not appear septic or toxic - patient on Rocephin for possible UTI coverage which will also cover for now as well - trend CMP - repeat lipase this am = now normal - given ongoing abdominal pain with u/s findings, will obtain HIDA scan to further evaluate as well   Iron excess - elevated iron, sat ratio, and ferritin - unclear etiology; no chronic transfusions -Patient presented with abdominal pain, back pain, weakness.  Jaundiced.  LFTs abnormal. - check A1c = 5.1% - no signs of secondary hypothyroidism as TSH is elevated - follow up GI workup; testing of hemochromatosis and other etiologies at this time  Hypothyroidism -TSH elevated. (14.67>>24.52) - check FT4 (normal, 0.97) and TT3 (normal) - likely elevated TSH 2/2 acute illness; no obvious signs of myxedema at this time - repeat TSH in 4-6 weeks   Acute lower UTI - UA unable to be tested - patient on empiric treatment with Rocephin; acceptable given underlying GB pathology for now  Malnutrition of moderate degree - appreciate RD consult. Per RD "evidenced by moderate fat depletion,moderate muscle depletion,energy intake < or equal to 75% for > or equal to 1 month" - continue Ensure and diet   Macrocytic anemia -B12 is normal, folate low -Check iron panel as well in case next mixed: Iron 191 (elevated), Sat 88% (high), elevated ferritin (802) -Continue on folic supplement  Metabolic acidosis - continue NS - BMP daily   HTN (hypertension) -Continue amlodipine  Hyperlipidemia -Statin on hold for now in setting of abnormal LFTs  Hypokalemia -  Replete and recheck as needed   Antimicrobials: Rocephin 12/10>>current   DVT prophylaxis: SCD Code Status: Full Family Communication: none present Disposition Plan: Status is: Inpatient  Remains inpatient appropriate because:Persistent severe  electrolyte disturbances, Ongoing active pain requiring inpatient pain management, Ongoing diagnostic testing needed not appropriate for outpatient work up, IV treatments appropriate due to intensity of illness or inability to take PO and Inpatient level of care appropriate due to severity of illness   Dispo: The patient is from: Home              Anticipated d/c is to: Home              Anticipated d/c date is: > 3 days              Patient currently is not medically stable to d/c.   Objective: Blood pressure 132/70, pulse 78, temperature 98.1 F (36.7 C), temperature source Oral, resp. rate 15, height 5\' 6"  (1.676 m), weight 63.5 kg, SpO2 96 %.  Examination: General appearance: awake, alert, on distress, grossly jaundiced Head: Normocephalic, without obvious abnormality, atraumatic Eyes: scleral icterus, EOMI Lungs: clear to auscultation bilaterally Heart: regular rate and rhythm and S1, S2 normal Abdomen: RUQ TTP with palpable GB; BS present Extremities: trace LE edema Skin: grossly jaundiced Neurologic: No focal deficits, no confusion   Consultants:   GI  Procedures:     Data Reviewed: I have personally reviewed following labs and imaging studies Results for orders placed or performed during the hospital encounter of 07/17/20 (from the past 24 hour(s))  Hemoglobin A1c     Status: None   Collection Time: 07/18/20  1:27 PM  Result Value Ref Range   Hgb A1c MFr Bld 5.1 4.8 - 5.6 %   Mean Plasma Glucose 99.67 mg/dL  CBC with Differential/Platelet     Status: Abnormal   Collection Time: 07/19/20  5:11 AM  Result Value Ref Range   WBC 6.2 4.0 - 10.5 K/uL   RBC 2.25 (L) 3.87 - 5.11 MIL/uL   Hemoglobin 9.0 (L) 12.0 - 15.0 g/dL   HCT 27.6 (L) 36.0 - 46.0 %   MCV 122.7 (H) 80.0 - 100.0 fL   MCH 40.0 (H) 26.0 - 34.0 pg   MCHC 32.6 30.0 - 36.0 g/dL   RDW 19.0 (H) 11.5 - 15.5 %   Platelets 147 (L) 150 - 400 K/uL   nRBC 0.8 (H) 0.0 - 0.2 %   Neutrophils Relative % 67 %    Neutro Abs 4.1 1.7 - 7.7 K/uL   Lymphocytes Relative 26 %   Lymphs Abs 1.6 0.7 - 4.0 K/uL   Monocytes Relative 6 %   Monocytes Absolute 0.4 0.1 - 1.0 K/uL   Eosinophils Relative 0 %   Eosinophils Absolute 0.0 0.0 - 0.5 K/uL   Basophils Relative 0 %   Basophils Absolute 0.0 0.0 - 0.1 K/uL   Immature Granulocytes 1 %   Abs Immature Granulocytes 0.06 0.00 - 0.07 K/uL   Polychromasia PRESENT    Basophilic Stippling PRESENT    Target Cells PRESENT   Comprehensive metabolic panel     Status: Abnormal   Collection Time: 07/19/20  5:11 AM  Result Value Ref Range   Sodium 135 135 - 145 mmol/L   Potassium 3.4 (L) 3.5 - 5.1 mmol/L   Chloride 98 98 - 111 mmol/L   CO2 23 22 - 32 mmol/L   Glucose, Bld 70 70 - 99 mg/dL   BUN 10 6 -  20 mg/dL   Creatinine, Ser 0.63 0.44 - 1.00 mg/dL   Calcium 7.2 (L) 8.9 - 10.3 mg/dL   Total Protein 5.1 (L) 6.5 - 8.1 g/dL   Albumin 2.4 (L) 3.5 - 5.0 g/dL   AST 165 (H) 15 - 41 U/L   ALT 64 (H) 0 - 44 U/L   Alkaline Phosphatase 85 38 - 126 U/L   Total Bilirubin 8.3 (H) 0.3 - 1.2 mg/dL   GFR, Estimated >60 >60 mL/min   Anion gap 14 5 - 15  Magnesium     Status: None   Collection Time: 07/19/20  5:11 AM  Result Value Ref Range   Magnesium 2.3 1.7 - 2.4 mg/dL  Lipase, blood     Status: None   Collection Time: 07/19/20  6:03 AM  Result Value Ref Range   Lipase 35 11 - 51 U/L    Recent Results (from the past 240 hour(s))  Culture, Urine     Status: None   Collection Time: 07/17/20  9:00 PM   Specimen: Urine, Random  Result Value Ref Range Status   Specimen Description   Final    URINE, RANDOM Performed at [facility], [place] [address], [place], [place] [address]    Special Requests   Final    NONE Performed at [facility], [place] [address], [place], [place] [address]    Culture   Final    NO GROWTH Performed at [facility], [place] [address], [place], [place] [address]    Report Status 07/19/2020 FINAL  Final   Resp Panel by RT-PCR (Flu A&B, Covid) Nasopharyngeal Swab     Status: None   Collection Time: 07/18/20 12:36 AM   Specimen: Nasopharyngeal Swab; Nasopharyngeal(NP) swabs in vial transport medium  Result Value Ref Range Status   SARS Coronavirus 2 by RT PCR NEGATIVE NEGATIVE Final    Comment: (NOTE) SARS-CoV-2 target nucleic acids are NOT DETECTED.  The SARS-CoV-2 RNA is generally detectable in upper respiratory specimens during the acute phase of infection. The lowest concentration of SARS-CoV-2 viral copies this assay can detect is 138 copies/mL. A negative result does not preclude SARS-Cov-2 infection and should not be used as the sole basis for treatment or other patient management decisions. A negative result may occur with  improper specimen collection/handling, submission of specimen other than nasopharyngeal swab, presence of viral mutation(s) within the areas targeted by this assay, and inadequate number of viral copies(<138 copies/mL). A negative result must be combined with clinical observations, patient history, and epidemiological information. The expected result is Negative.  Fact Sheet for Patients:  EntrepreneurPulse.com.au  Fact Sheet for Healthcare Providers:  IncredibleEmployment.be  This test is no t yet approved or cleared by the [place] FDA and  has been authorized for detection and/or diagnosis of SARS-CoV-2 by FDA under an Emergency Use Authorization (EUA). This EUA will remain  in effect (meaning this test can be used) for the duration of the COVID-19 declaration under Section 564(b)(1) of the Act, 21 U.S.C.section 360bbb-3(b)(1), unless the authorization is terminated  or revoked sooner.       Influenza A by PCR NEGATIVE NEGATIVE Final   Influenza B by PCR NEGATIVE NEGATIVE Final    Comment: (NOTE) The Xpert Xpress SARS-CoV-2/FLU/RSV plus assay is intended as an aid in the diagnosis of influenza from  Nasopharyngeal swab specimens and should not be used as a sole basis for treatment. Nasal washings and aspirates are unacceptable for Xpert Xpress  SARS-CoV-2/FLU/RSV testing.  Fact Sheet for Patients: EntrepreneurPulse.com.au  Fact Sheet for Healthcare Providers: IncredibleEmployment.be  This test is not yet approved or cleared by the [place] FDA and has been authorized for detection and/or diagnosis of SARS-CoV-2 by FDA under an Emergency Use Authorization (EUA). This EUA will remain in effect (meaning this test can be used) for the duration of the COVID-19 declaration under Section 564(b)(1) of the Act, 21 U.S.C. section 360bbb-3(b)(1), unless the authorization is terminated or revoked.  Performed at [facility], [place] [address], [place], [place] [address]      Radiology Studies: CT Abdomen Pelvis W Contrast  Result Date: 07/17/2020 CLINICAL DATA:  Abdominal pain and vomiting EXAM: CT ABDOMEN AND PELVIS WITH CONTRAST TECHNIQUE: Multidetector CT imaging of the abdomen and pelvis was performed using the standard protocol following bolus administration of intravenous contrast. CONTRAST:  172mL OMNIPAQUE IOHEXOL 300 MG/ML  SOLN COMPARISON:  April 06, 2020 FINDINGS: Lower chest: The visualized heart size within normal limits. No pericardial fluid/thickening. No hiatal hernia. The visualized portions of the lungs are clear. Hepatobiliary: There is diffuse low density seen throughout the liver parenchyma with mild hepatomegaly.The main portal vein is patent. The gallbladder is mildly fluid-filled and distended. No intrahepatic biliary ductal dilatation is noted. Pancreas: Unremarkable. No pancreatic ductal dilatation or surrounding inflammatory changes. Spleen: Normal in size without focal abnormality. Adrenals/Urinary Tract: Both adrenal glands appear normal. Within the lower pole of the right kidney again noted is a 2.1 cm fat  containing lesion, likely angiomyolipoma. There is a 3 mm calculus in the lower pole of the right kidney. No hydronephrosis. The bladder is partially decompressed. Stomach/Bowel: The stomach, small bowel, and colon are normal in appearance. No inflammatory changes, wall thickening, or obstructive findings.The appendix is normal. Vascular/Lymphatic: There are no enlarged mesenteric, retroperitoneal, or pelvic lymph nodes. Scattered mild aortic atherosclerosis is seen. Reproductive: Multiple heterogeneously enhancing uterine fibroids are again identified with the large uterus. Some of which appear to be partially calcified. Other: No evidence of abdominal wall mass or hernia. Musculoskeletal: No acute or significant osseous findings. IMPRESSION: Hepatic steatosis. Mildly distended fluid-filled gallbladder, however no definite evidence of surrounding inflammatory changes. Stable 2 cm right-sided renal angiomyolipoma Nonobstructing right renal calculus. Enlarged uterus with multiple uterine fibroids. Aortic Atherosclerosis (ICD10-I70.0). Electronically Signed   By: Prudencio Pair M.D.   On: 07/17/2020 22:51   US Abdomen Limited RUQ (LIVER/GB)  Result Date: 07/18/2020 CLINICAL DATA:  Abdominal pain increased LFTs EXAM: ULTRASOUND ABDOMEN LIMITED RIGHT UPPER QUADRANT COMPARISON:  CT prior day FINDINGS: Gallbladder: Layering slightly hyperdense sludge/small stones are seen. No sonographic Murphy sign noted by sonographer. Common bile duct: Diameter: 2 mm Liver: Increased echotexture seen throughout with hepatomegaly. No focal abnormality or biliary ductal dilatation. Portal vein is patent on color Doppler imaging with normal direction of blood flow towards the liver. Other: None. IMPRESSION: Layering gallbladder sludge/stones. No definite evidence of acute cholecystitis. Hepatic steatosis and mild hepatomegaly Electronically Signed   By: Prudencio Pair M.D.   On: 07/18/2020 02:45   US Abdomen Limited RUQ (LIVER/GB)   Final Result    CT Abdomen Pelvis W Contrast  Final Result    NM Hepato W/EF    (Results Pending)    Scheduled Meds: . amLODipine  5 mg Oral Daily  . feeding supplement  237 mL Oral BID BM  . folic acid  1 mg Oral Daily  . influenza vac split quadrivalent PF  0.5 mL Intramuscular Tomorrow-1000  .  levothyroxine  75 mcg Oral Q0600  . thiamine injection  100 mg Intravenous Daily  . venlafaxine XR  75 mg Oral Daily   PRN Meds: morphine injection, ondansetron **OR** ondansetron (ZOFRAN) IV Continuous Infusions: . sodium chloride 150 mL/hr at 07/19/20 1015  . cefTRIAXone (ROCEPHIN)  IV Stopped (07/19/20 0215)     LOS: 1 day  Time spent: Greater than 50% of the 35 minute visit was spent in counseling/coordination of care for the patient as laid out in the A&P.   Dwyane Dee, MD [facility] 07/19/2020, 1:04 PM

## 2020-07-19 NOTE — Progress Notes (Signed)
Pharmacy IV to PO conversion  The patient is receiving thiamine by the intravenous route.  Based on criteria approved by the Pharmacy and [place], the medication is being converted to the equivalent oral dose form.   No active GI bleeding or impaired absorption  Not s/p esophagectomy  Documented ability to take oral medications for > 24 hr  Plan to continue treatment for at least 1 day  If you have any questions about this conversion, please contact the Pharmacy Department (ext [phone]).  Thank you.  Reuel Boom, PharmD, BCPS [phone] 07/19/2020, 2:26 PM

## 2020-07-19 NOTE — Plan of Care (Signed)

## 2020-07-19 NOTE — Assessment & Plan Note (Signed)
-   appreciate RD consult. Per RD "evidenced by moderate fat depletion,moderate muscle depletion,energy intake < or equal to 75% for > or equal to 1 month" - continue Ensure and diet

## 2020-07-19 NOTE — Progress Notes (Signed)
Occupational Therapy Treatment Patient Details Name: Judy Meyer MRN: [id number] DOB: June 20, 1961 Today's Date: 07/19/2020    History of present illness 59 y.o. female with medical history significant of fibroids, HTN, HLD, depression and admitted for nausea and abdominal pain, found to have Hyperbilirubinemia/ elevated LFT's   OT comments  Pt reports feeling better this day !  Follow Up Recommendations  No OT follow up    Equipment Recommendations  Tub/shower seat    Recommendations for Other Services      Precautions / Restrictions Precautions Precautions: Fall Precaution Comments: orthostatic       Mobility Bed Mobility Overal bed mobility: Needs Assistance Bed Mobility: Supine to Sit     Supine to sit: Min guard;HOB elevated     General bed mobility comments: increased time and effort  Transfers Overall transfer level: Needs assistance Equipment used: None Transfers: Sit to/from [facility] Sit to Stand: Min assist Stand pivot transfers: Min assist            Balance Overall balance assessment: Needs assistance Sitting-balance support: Feet supported Sitting balance-Leahy Scale: Fair     Standing balance support: No upper extremity supported Standing balance-Leahy Scale: Fair Standing balance comment: min A static standing                           ADL either performed or assessed with clinical judgement   ADL       Grooming: Set up;Sitting;Wash/dry face;Oral care       Lower Body Bathing: Minimal assistance;Sitting/lateral leans;Sit to/from stand           Toilet Transfer: Min guard   Toileting- Water quality scientist and Hygiene: Minimal assistance;Sit to/from stand                         Cognition Arousal/Alertness: Awake/alert Behavior During Therapy: WFL for tasks assessed/performed Overall Cognitive Status: Within Functional Limits for tasks assessed                                                      Pertinent Vitals/ Pain       Pain Assessment: No/denies pain         Frequency  Min 2X/week        Progress Toward Goals  OT Goals(current goals can now be found in the care plan section)  Progress towards OT goals: Progressing toward goals     Plan Discharge plan remains appropriate    Co-evaluation                 AM-PAC OT "6 Clicks" Daily Activity     Outcome Measure   Help from another person eating meals?: None Help from another person taking care of personal grooming?: A Little Help from another person toileting, which includes using toliet, bedpan, or urinal?: A Little Help from another person bathing (including washing, rinsing, drying)?: A Little Help from another person to put on and taking off regular upper body clothing?: A Little Help from another person to put on and taking off regular lower body clothing?: A Little 6 Click Score: 19    End of Session    OT Visit Diagnosis: Other abnormalities of gait and mobility (R26.89)   Activity Tolerance Patient tolerated treatment well (  orthostatic)   Patient Left with call bell/phone within reach;in chair;with chair alarm set   Nurse Communication Other (comment) (RN present for treatment)        Time: 1250-1310 OT Time Calculation (min): 20 min  Charges: OT General Charges $OT Visit: 1 Visit OT Treatments $Self Care/Home Management : 8-22 mins  Judy Meyer, [place] [phone] Office- [phone]      Judy Meyer, Judy Meyer 07/19/2020, 3:08 PM

## 2020-07-19 NOTE — Progress Notes (Signed)
[facility] Gastroenterology Progress Note  BERNADENE GARSIDE 59 y.o. Apr 19, 1961  CC: Abnormal LFTs, jaundice   Subjective: Patient seen and examined at bedside.  Complaining of mild right upper quadrant discomfort.  Denies any blood in the stool or black stool.  ROS : Afebrile.  Negative for chest pain   Objective: Vital signs in last 24 hours: Vitals:   07/19/20 0500 07/19/20 0504  BP:  121/71  Pulse:  84  Resp: 13 18  Temp:  98.1 F (36.7 C)  SpO2:  97%    Physical Exam:  General:  Alert, cooperative, no distress, appears stated age  Head:  Normocephalic, without obvious abnormality, atraumatic  Eyes:   Scleral icterus noted  Lungs:   Clear to auscultation bilaterally, respirations unlabored  Heart:  Regular rate and rhythm, S1, S2 normal  Abdomen:    Right upper quadrant tenderness to palpation, abdomen is soft, nondistended, bowel sounds present.  No peritoneal signs  Extremities: Extremities normal, atraumatic, no  edema       Lab Results: Recent Labs    07/18/20 0132 07/18/20 0500 07/19/20 0511  NA  --  137 135  K  --  3.2* 3.4*  CL  --  98 98  CO2  --  23 23  GLUCOSE  --  101* 70  BUN  --  11 10  CREATININE  --  0.69 0.63  CALCIUM  --  7.6* 7.2*  MG 1.5* 1.5* 2.3  PHOS 2.0* 2.0*  --    Recent Labs    07/18/20 0500 07/19/20 0511  AST 217* 165*  ALT 73* 64*  ALKPHOS 95 85  BILITOT 10.7* 8.3*  PROT 5.6* 5.1*  ALBUMIN 2.7* 2.4*   Recent Labs    07/18/20 0500 07/19/20 0511  WBC 7.4 6.2  NEUTROABS 5.4 4.1  HGB 9.7* 9.0*  HCT 29.0* 27.6*  MCV 117.9* 122.7*  PLT 155 147*   Recent Labs    07/18/20 0132 07/18/20 0500  LABPROT 15.4* 15.7*  INR 1.3* 1.3*      Assessment/Plan: Abnormal LFTs.  Mild elevation of AST and ALT with significant elevation of T bili.  Normal alkaline phosphatase.  T bili is trending down, 8.3 today.  Patient with occasional alcohol use.  Hepatitis panel negative.  Mild elevated iron saturation.  Mild elevated ferritin  at around 800.  -Right upper quadrant pain.  CT scan showed mildly distended fluid-filled gallbladder.  Ultrasound showed layering sludge versus stone without any acute cholecystitis.  CBD of 2 mm.  Patient with normal WBC counts.  Recommendations -------------------------- -Continue supportive care for now -Follow secondary markers for liver disease. -If worsening right upper quadrant abdominal pain, recommend HIDA scan with EF. -GI will follow   Otis Brace MD, [place] 07/19/2020, 10:00 AM  Contact #  [phone]

## 2020-07-20 DIAGNOSIS — M6282 Rhabdomyolysis: Secondary | ICD-10-CM

## 2020-07-20 LAB — CBC WITH DIFFERENTIAL/PLATELET
Abs Immature Granulocytes: 0.09 10*3/uL — ABNORMAL HIGH (ref 0.00–0.07)
Basophils Absolute: 0 10*3/uL (ref 0.0–0.1)
Basophils Relative: 0 %
Eosinophils Absolute: 0 10*3/uL (ref 0.0–0.5)
Eosinophils Relative: 0 %
HCT: 29.5 % — ABNORMAL LOW (ref 36.0–46.0)
Hemoglobin: 9.8 g/dL — ABNORMAL LOW (ref 12.0–15.0)
Immature Granulocytes: 1 %
Lymphocytes Relative: 28 %
Lymphs Abs: 1.8 10*3/uL (ref 0.7–4.0)
MCH: 40.7 pg — ABNORMAL HIGH (ref 26.0–34.0)
MCHC: 33.2 g/dL (ref 30.0–36.0)
MCV: 122.4 fL — ABNORMAL HIGH (ref 80.0–100.0)
Monocytes Absolute: 0.5 10*3/uL (ref 0.1–1.0)
Monocytes Relative: 8 %
Neutro Abs: 4 10*3/uL (ref 1.7–7.7)
Neutrophils Relative %: 63 %
Platelets: 160 10*3/uL (ref 150–400)
RBC: 2.41 MIL/uL — ABNORMAL LOW (ref 3.87–5.11)
RDW: 18.2 % — ABNORMAL HIGH (ref 11.5–15.5)
WBC: 6.5 10*3/uL (ref 4.0–10.5)
nRBC: 0.6 % — ABNORMAL HIGH (ref 0.0–0.2)

## 2020-07-20 LAB — CERULOPLASMIN: Ceruloplasmin: 17.2 mg/dL — ABNORMAL LOW (ref 19.0–39.0)

## 2020-07-20 LAB — COMPREHENSIVE METABOLIC PANEL
ALT: 63 U/L — ABNORMAL HIGH (ref 0–44)
AST: 160 U/L — ABNORMAL HIGH (ref 15–41)
Albumin: 2.4 g/dL — ABNORMAL LOW (ref 3.5–5.0)
Alkaline Phosphatase: 94 U/L (ref 38–126)
Anion gap: 12 (ref 5–15)
BUN: 7 mg/dL (ref 6–20)
CO2: 20 mmol/L — ABNORMAL LOW (ref 22–32)
Calcium: 7.2 mg/dL — ABNORMAL LOW (ref 8.9–10.3)
Chloride: 99 mmol/L (ref 98–111)
Creatinine, Ser: 0.47 mg/dL (ref 0.44–1.00)
GFR, Estimated: >60 mL/min (ref >60)
Glucose, Bld: 99 mg/dL (ref 70–99)
Potassium: 3.2 mmol/L — ABNORMAL LOW (ref 3.5–5.1)
Sodium: 131 mmol/L — ABNORMAL LOW (ref 135–145)
Total Bilirubin: 10.7 mg/dL — ABNORMAL HIGH (ref 0.3–1.2)
Total Protein: 5.3 g/dL — ABNORMAL LOW (ref 6.5–8.1)

## 2020-07-20 LAB — CK: Total CK: 3532 U/L — ABNORMAL HIGH (ref 38–234)

## 2020-07-20 LAB — ALPHA-1-ANTITRYPSIN: A-1 Antitrypsin, Ser: 134 mg/dL (ref 101–187)

## 2020-07-20 LAB — MAGNESIUM: Magnesium: 1.9 mg/dL (ref 1.7–2.4)

## 2020-07-20 MED ORDER — POLYETHYLENE GLYCOL 3350 17 G PO PACK
17.0000 g | PACK | Freq: Every day | ORAL | Status: DC
  Administered 2020-07-20 – 2020-07-21 (×2): 17 g via ORAL
  Filled 2020-07-20 (×5): qty 1

## 2020-07-20 MED ORDER — MORPHINE SULFATE (PF) 2 MG/ML IV SOLN
2.0000 mg | INTRAVENOUS | Status: AC | PRN
  Administered 2020-07-20 – 2020-07-21 (×3): 2 mg via INTRAVENOUS
  Filled 2020-07-20 (×3): qty 1

## 2020-07-20 MED ORDER — POTASSIUM CHLORIDE CRYS ER 20 MEQ PO TBCR
40.0000 meq | EXTENDED_RELEASE_TABLET | Freq: Once | ORAL | Status: AC
  Administered 2020-07-20: 40 meq via ORAL
  Filled 2020-07-20: qty 2

## 2020-07-20 MED ORDER — MORPHINE SULFATE (PF) 2 MG/ML IV SOLN: 2.0000 mg | INTRAVENOUS | Status: DC | PRN

## 2020-07-20 NOTE — Progress Notes (Signed)
[facility] Gastroenterology Progress Note  Judy Meyer 59 y.o. May 22, 1961  CC: Abnormal LFTs, jaundice   Subjective: Patient seen and examined at bedside.  Continues to have right upper quadrant and epigastric discomfort.  No bowel movement since admission.  ROS : Afebrile.  Negative for chest pain   Objective: Vital signs in last 24 hours: Vitals:   07/19/20 2121 07/20/20 0448  BP: 115/70 113/87  Pulse: 75 75  Resp: 18 18  Temp: 97.9 F (36.6 C) 97.9 F (36.6 C)  SpO2: 98% 100%    Physical Exam:  General:  Alert, cooperative, no distress, appears stated age  Head:  Normocephalic, without obvious abnormality, atraumatic  Eyes:   Scleral icterus noted  Lungs:   Clear to auscultation bilaterally, respirations unlabored  Heart:  Regular rate and rhythm, S1, S2 normal  Abdomen:    Right upper quadrant tenderness to palpation, abdomen is soft, nondistended, bowel sounds present.  No peritoneal signs  Extremities: Extremities normal, atraumatic, no  edema       Lab Results: Recent Labs    07/18/20 0132 07/18/20 0500 07/19/20 0511 07/20/20 0439  NA  --  137 135 131*  K  --  3.2* 3.4* 3.2*  CL  --  98 98 99  CO2  --  23 23 20*  GLUCOSE  --  101* 70 99  BUN  --  11 10 7   CREATININE  --  0.69 0.63 0.47  CALCIUM  --  7.6* 7.2* 7.2*  MG 1.5* 1.5* 2.3 1.9  PHOS 2.0* 2.0*  --   --    Recent Labs    07/19/20 0511 07/20/20 0439  AST 165* 160*  ALT 64* 63*  ALKPHOS 85 94  BILITOT 8.3* 10.7*  PROT 5.1* 5.3*  ALBUMIN 2.4* 2.4*   Recent Labs    07/19/20 0511 07/20/20 0629  WBC 6.2 6.5  NEUTROABS 4.1 4.0  HGB 9.0* 9.8*  HCT 27.6* 29.5*  MCV 122.7* 122.4*  PLT 147* 160   Recent Labs    07/18/20 0132 07/18/20 0500  LABPROT 15.4* 15.7*  INR 1.3* 1.3*      Assessment/Plan: Abnormal LFTs.  Mild elevation of AST and ALT with significant elevation of T bili.  Normal alkaline phosphatase.    Patient with occasional alcohol use.  Hepatitis panel negative.   Mild elevated iron saturation.  Mild elevated ferritin at around 800.  ANA, AMA and ASMA normal.  -Right upper quadrant pain.  CT scan showed mildly distended fluid-filled gallbladder.  Ultrasound showed layering sludge versus stone without any acute cholecystitis.  CBD of 2 mm.  Patient with normal WBC counts.  -Constipation.  Recommendations -------------------------- -Start MiraLAX for constipation -Continue supportive care for now -Follow hemochromatosis gene analysis, ceruloplasmin and alpha-1 antitrypsin level. -HIDA scan pending.  May need a liver biopsy -GI will follow   Otis Brace MD, [place] 07/20/2020, 1:15 PM  Contact #  [phone]

## 2020-07-20 NOTE — Assessment & Plan Note (Addendum)
-   patient denied falling, trauma, or being on ground for any reason; denies muscle pains (pain complaint is RUQ radiating to side and back) - initial CK 5731 on admission. Renal function has been normal  -Trend CK, slowly coming down - continue IVF

## 2020-07-20 NOTE — Progress Notes (Signed)
PROGRESS NOTE    Judy Meyer   ATF:[id number]  DOB: 1961-01-07  DOA: 07/17/2020     2  PCP: Pcp, No  CC: jaundice, abd pain, N/V, weakness, weight loss  Hospital Course: Judy Meyer is a 59  yo female with PMH hypothyroidism, hypertension, uterine fibroids, hyperlipidemia, depression who presented to the ER with worsening nausea and vomiting.  She endorsed a global decline over the past 4 months and has been acutely declining over the past several days to weeks.  She has noticed yellowing of her skin, decreased strength, and weight loss but has not been weighing herself. She denied any excessive alcohol use, tobacco use, or illicit drug use on admission.  Also does not take any significant amounts of Tylenol.  On work-up she was found to have multiple lab abnormalities.  Also underwent ultrasound abdomen which showed gallbladder stones and sludge. LFTs were elevated and she was also found to have iron excess.  GI was consulted on admission as well.    Interval History:  No events overnight. Resting in bed still complaining of RUQ pain. Asking if meds can be adjusted as pain is 9/10 prior to meds and goes to 7/10 after morphine then wears off too fast.  Tolerating diet and no N/V.   Old records reviewed in assessment of this patient  ROS: Constitutional: positive for anorexia, fatigue, malaise and weight loss, Respiratory: negative for cough, Cardiovascular: negative for chest pain, Gastrointestinal: positive for abdominal pain, jaundice, nausea and vomiting, negative for melena and Neurological: positive for weakness, negative for memory problems and confusion  Assessment & Plan: * Abnormal liver function - no s/s ascending cholangitis at this time (patient afebrile and no leukocytosis) - GI consulted as well, follow up recommendations - LFTs elevated; no AP elevation or biliary dilatation so not felt to be obstructive per GI. (Ultrasound noted with cholelithiasis and sludge, no  evidence of acute cholecystitis) - continue diet (advanced per GI) for now and anti-emetics - lactic considered elevated in setting of abnormal hepatic function; does not appear septic or toxic - patient on Rocephin for possible UTI coverage which will also cover for now as well - trend CMP - repeat lipase this am = now normal - given ongoing abdominal pain with u/s findings, will obtain HIDA scan to further evaluate as well   Rhabdomyolysis - patient denied falling, trauma, or being on ground for any reason; denies muscle pains (pain complaint is RUQ radiating to side and back) - initial CK 5731 on admission. Renal function has been normal  - repeat CK today - continue IVF  Iron excess - elevated iron, sat ratio, and ferritin - unclear etiology; no chronic transfusions -Patient presented with abdominal pain, back pain, weakness.  Jaundiced.  LFTs abnormal. - check A1c = 5.1% - no signs of secondary hypothyroidism as TSH is elevated - follow up GI workup; testing of hemochromatosis and other etiologies at this time  Hypothyroidism -TSH elevated. (14.67>>24.52) - check FT4 (normal, 0.97) and TT3 (normal) - likely elevated TSH 2/2 acute illness; no obvious signs of myxedema at this time - repeat TSH in 4-6 weeks   Acute lower UTI - Urine culture negative  - patient on empiric treatment with Rocephin; acceptable given underlying GB pathology for now  Malnutrition of moderate degree - appreciate RD consult. Per RD "evidenced by moderate fat depletion,moderate muscle depletion,energy intake < or equal to 75% for > or equal to 1 month" - continue Ensure and diet  Macrocytic anemia -B12 is normal, folate low -Check iron panel as well in case mixed: Iron 191 (elevated), Sat 88% (high), elevated ferritin (802), see iron excess -Continue on folic supplement  Metabolic acidosis - continue NS - BMP daily   HTN (hypertension) -Continue amlodipine  Hyperlipidemia -Statin on hold  for now in setting of abnormal LFTs  Hypokalemia -Replete and recheck as needed   Antimicrobials: Rocephin 12/10>>current   DVT prophylaxis: SCD Code Status: Full Family Communication: none present Disposition Plan: Status is: Inpatient  Remains inpatient appropriate because:Persistent severe electrolyte disturbances, Ongoing active pain requiring inpatient pain management, Ongoing diagnostic testing needed not appropriate for outpatient work up, IV treatments appropriate due to intensity of illness or inability to take PO and Inpatient level of care appropriate due to severity of illness   Dispo: The patient is from: Home              Anticipated d/c is to: Home              Anticipated d/c date is: > 3 days              Patient currently is not medically stable to d/c.   Objective: Blood pressure 113/87, pulse 75, temperature 97.9 F (36.6 C), temperature source Oral, resp. rate 18, height 5\' 6"  (1.676 m), weight 63.5 kg, SpO2 100 %.  Examination: General appearance: awake, alert, on distress, grossly jaundiced Head: Normocephalic, without obvious abnormality, atraumatic Eyes: scleral icterus, EOMI Lungs: clear to auscultation bilaterally Heart: regular rate and rhythm and S1, S2 normal Abdomen: RUQ TTP with palpable GB; BS present Extremities: trace LE edema Skin: grossly jaundiced Neurologic: No focal deficits, no confusion   Consultants:   GI  Procedures:     Data Reviewed: I have personally reviewed following labs and imaging studies Results for orders placed or performed during the hospital encounter of 07/17/20 (from the past 24 hour(s))  Comprehensive metabolic panel     Status: Abnormal   Collection Time: 07/20/20  4:39 AM  Result Value Ref Range   Sodium 131 (L) 135 - 145 mmol/L   Potassium 3.2 (L) 3.5 - 5.1 mmol/L   Chloride 99 98 - 111 mmol/L   CO2 20 (L) 22 - 32 mmol/L   Glucose, Bld 99 70 - 99 mg/dL   BUN 7 6 - 20 mg/dL   Creatinine, Ser 0.47  0.44 - 1.00 mg/dL   Calcium 7.2 (L) 8.9 - 10.3 mg/dL   Total Protein 5.3 (L) 6.5 - 8.1 g/dL   Albumin 2.4 (L) 3.5 - 5.0 g/dL   AST 160 (H) 15 - 41 U/L   ALT 63 (H) 0 - 44 U/L   Alkaline Phosphatase 94 38 - 126 U/L   Total Bilirubin 10.7 (H) 0.3 - 1.2 mg/dL   GFR, Estimated >60 >60 mL/min   Anion gap 12 5 - 15  Magnesium     Status: None   Collection Time: 07/20/20  4:39 AM  Result Value Ref Range   Magnesium 1.9 1.7 - 2.4 mg/dL  CBC with Differential/Platelet     Status: Abnormal   Collection Time: 07/20/20  6:29 AM  Result Value Ref Range   WBC 6.5 4.0 - 10.5 K/uL   RBC 2.41 (L) 3.87 - 5.11 MIL/uL   Hemoglobin 9.8 (L) 12.0 - 15.0 g/dL   HCT 29.5 (L) 36.0 - 46.0 %   MCV 122.4 (H) 80.0 - 100.0 fL   MCH 40.7 (H) 26.0 - 34.0 pg  MCHC 33.2 30.0 - 36.0 g/dL   RDW 18.2 (H) 11.5 - 15.5 %   Platelets 160 150 - 400 K/uL   nRBC 0.6 (H) 0.0 - 0.2 %   Neutrophils Relative % 63 %   Neutro Abs 4.0 1.7 - 7.7 K/uL   Lymphocytes Relative 28 %   Lymphs Abs 1.8 0.7 - 4.0 K/uL   Monocytes Relative 8 %   Monocytes Absolute 0.5 0.1 - 1.0 K/uL   Eosinophils Relative 0 %   Eosinophils Absolute 0.0 0.0 - 0.5 K/uL   Basophils Relative 0 %   Basophils Absolute 0.0 0.0 - 0.1 K/uL   Immature Granulocytes 1 %   Abs Immature Granulocytes 0.09 (H) 0.00 - 0.07 K/uL   Reactive, Benign Lymphocytes PRESENT    Polychromasia PRESENT    Target Cells PRESENT     Recent Results (from the past 240 hour(s))  Culture, Urine     Status: None   Collection Time: 07/17/20  9:00 PM   Specimen: Urine, Random  Result Value Ref Range Status   Specimen Description   Final    URINE, RANDOM Performed at [facility], [place] [address], [place], [place] [address]    Special Requests   Final    NONE Performed at [facility], [place] [address] [place], [place] [address]    Culture   Final    NO GROWTH Performed at [facility], [place] [address], [place], [place] [address]     Report Status 07/19/2020 FINAL  Final  Resp Panel by RT-PCR (Flu A&B, Covid) Nasopharyngeal Swab     Status: None   Collection Time: 07/18/20 12:36 AM   Specimen: Nasopharyngeal Swab; Nasopharyngeal(NP) swabs in vial transport medium  Result Value Ref Range Status   SARS Coronavirus 2 by RT PCR NEGATIVE NEGATIVE Final    Comment: (NOTE) SARS-CoV-2 target nucleic acids are NOT DETECTED.  The SARS-CoV-2 RNA is generally detectable in upper respiratory specimens during the acute phase of infection. The lowest concentration of SARS-CoV-2 viral copies this assay can detect is 138 copies/mL. A negative result does not preclude SARS-Cov-2 infection and should not be used as the sole basis for treatment or other patient management decisions. A negative result may occur with  improper specimen collection/handling, submission of specimen other than nasopharyngeal swab, presence of viral mutation(s) within the areas targeted by this assay, and inadequate number of viral copies(<138 copies/mL). A negative result must be combined with clinical observations, patient history, and epidemiological information. The expected result is Negative.  Fact Sheet for Patients:  EntrepreneurPulse.com.au  Fact Sheet for Healthcare Providers:  IncredibleEmployment.be  This test is no t yet approved or cleared by the [place] FDA and  has been authorized for detection and/or diagnosis of SARS-CoV-2 by FDA under an Emergency Use Authorization (EUA). This EUA will remain  in effect (meaning this test can be used) for the duration of the COVID-19 declaration under Section 564(b)(1) of the Act, 21 U.S.C.section 360bbb-3(b)(1), unless the authorization is terminated  or revoked sooner.       Influenza A by PCR NEGATIVE NEGATIVE Final   Influenza B by PCR NEGATIVE NEGATIVE Final    Comment: (NOTE) The Xpert Xpress SARS-CoV-2/FLU/RSV plus assay is intended as an aid in  the diagnosis of influenza from Nasopharyngeal swab specimens and should not be used as a sole basis for treatment. Nasal washings and aspirates are unacceptable for Xpert Xpress SARS-CoV-2/FLU/RSV testing.  Fact Sheet for Patients:  EntrepreneurPulse.com.au  Fact Sheet for Healthcare Providers: IncredibleEmployment.be  This test is not yet approved or cleared by the [place] FDA and has been authorized for detection and/or diagnosis of SARS-CoV-2 by FDA under an Emergency Use Authorization (EUA). This EUA will remain in effect (meaning this test can be used) for the duration of the COVID-19 declaration under Section 564(b)(1) of the Act, 21 U.S.C. section 360bbb-3(b)(1), unless the authorization is terminated or revoked.  Performed at [facility], [place] [address], [place], [place] [address]      Radiology Studies: No results found. US Abdomen Limited RUQ (LIVER/GB)  Final Result    CT Abdomen Pelvis W Contrast  Final Result    NM Hepato W/EF    (Results Pending)    Scheduled Meds: . amLODipine  5 mg Oral Daily  . feeding supplement  237 mL Oral BID BM  . folic acid  1 mg Oral Daily  . influenza vac split quadrivalent PF  0.5 mL Intramuscular Tomorrow-1000  . levothyroxine  75 mcg Oral Q0600  . thiamine  100 mg Oral Daily  . venlafaxine XR  75 mg Oral Daily   PRN Meds: morphine injection, ondansetron **OR** ondansetron (ZOFRAN) IV Continuous Infusions: . sodium chloride 150 mL/hr at 07/20/20 0621  . cefTRIAXone (ROCEPHIN)  IV 1 g (07/20/20 0038)     LOS: 2 days  Time spent: Greater than 50% of the 35 minute visit was spent in counseling/coordination of care for the patient as laid out in the A&P.   Dwyane Dee, MD [facility] 07/20/2020, 12:49 PM

## 2020-07-20 NOTE — Plan of Care (Signed)
  Problem: Education: Goal: Knowledge of General Education information will improve Description: Including pain rating scale, medication(s)/side effects and non-pharmacologic comfort measures Outcome: Progressing   Problem: Clinical Measurements: Goal: Ability to maintain clinical measurements within normal limits will improve Outcome: Progressing Goal: Will remain free from infection Outcome: Progressing Goal: Cardiovascular complication will be avoided Outcome: Progressing   Problem: Activity: Goal: Risk for activity intolerance will decrease Outcome: Progressing   Problem: Nutrition: Goal: Adequate nutrition will be maintained Outcome: Progressing   Problem: Elimination: Goal: Will not experience complications related to bowel motility Outcome: Progressing   Problem: Pain Managment: Goal: General experience of comfort will improve Outcome: Progressing   Problem: Safety: Goal: Ability to remain free from injury will improve Outcome: Progressing   Problem: Skin Integrity: Goal: Risk for impaired skin integrity will decrease Outcome: Progressing   Problem: Clinical Measurements: Goal: Respiratory complications will improve Outcome: Completed/Met   Problem: Coping: Goal: Level of anxiety will decrease Outcome: Completed/Met

## 2020-07-21 ENCOUNTER — Inpatient Hospital Stay (HOSPITAL_COMMUNITY): Payer: Medicare PPO

## 2020-07-21 LAB — CBC WITH DIFFERENTIAL/PLATELET
Abs Immature Granulocytes: 0 10*3/uL (ref 0.00–0.07)
Band Neutrophils: 0 %
Basophils Absolute: 0 10*3/uL (ref 0.0–0.1)
Basophils Relative: 0 %
Blasts: 0 %
Eosinophils Absolute: 0.1 10*3/uL (ref 0.0–0.5)
Eosinophils Relative: 1 %
HCT: 27.3 % — ABNORMAL LOW (ref 36.0–46.0)
Hemoglobin: 8.8 g/dL — ABNORMAL LOW (ref 12.0–15.0)
Lymphocytes Relative: 25 %
Lymphs Abs: 1.3 10*3/uL (ref 0.7–4.0)
MCH: 40 pg — ABNORMAL HIGH (ref 26.0–34.0)
MCHC: 32.2 g/dL (ref 30.0–36.0)
MCV: 124.1 fL — ABNORMAL HIGH (ref 80.0–100.0)
Metamyelocytes Relative: 0 %
Monocytes Absolute: 0.4 10*3/uL (ref 0.1–1.0)
Monocytes Relative: 7 %
Myelocytes: 0 %
Neutro Abs: 3.5 10*3/uL (ref 1.7–7.7)
Neutrophils Relative %: 67 %
Other: 0 %
Platelets: 164 10*3/uL (ref 150–400)
Promyelocytes Relative: 0 %
RBC: 2.2 MIL/uL — ABNORMAL LOW (ref 3.87–5.11)
RDW: 18.5 % — ABNORMAL HIGH (ref 11.5–15.5)
WBC: 5.3 10*3/uL (ref 4.0–10.5)
nRBC: 1 /100 WBC — ABNORMAL HIGH
nRBC: 1.1 % — ABNORMAL HIGH (ref 0.0–0.2)

## 2020-07-21 LAB — COMPREHENSIVE METABOLIC PANEL
ALT: 59 U/L — ABNORMAL HIGH (ref 0–44)
AST: 137 U/L — ABNORMAL HIGH (ref 15–41)
Albumin: 2.3 g/dL — ABNORMAL LOW (ref 3.5–5.0)
Alkaline Phosphatase: 87 U/L (ref 38–126)
Anion gap: 9 (ref 5–15)
BUN: <5 mg/dL — ABNORMAL LOW (ref 6–20)
CO2: 23 mmol/L (ref 22–32)
Calcium: 7.3 mg/dL — ABNORMAL LOW (ref 8.9–10.3)
Chloride: 102 mmol/L (ref 98–111)
Creatinine, Ser: 0.36 mg/dL — ABNORMAL LOW (ref 0.44–1.00)
GFR, Estimated: >60 mL/min (ref >60)
Glucose, Bld: 76 mg/dL (ref 70–99)
Potassium: 3.6 mmol/L (ref 3.5–5.1)
Sodium: 134 mmol/L — ABNORMAL LOW (ref 135–145)
Total Bilirubin: 11.2 mg/dL — ABNORMAL HIGH (ref 0.3–1.2)
Total Protein: 5 g/dL — ABNORMAL LOW (ref 6.5–8.1)

## 2020-07-21 LAB — HEPATIC FUNCTION PANEL
ALT: 61 U/L — ABNORMAL HIGH (ref 0–44)
AST: 139 U/L — ABNORMAL HIGH (ref 15–41)
Albumin: 2.3 g/dL — ABNORMAL LOW (ref 3.5–5.0)
Alkaline Phosphatase: 90 U/L (ref 38–126)
Bilirubin, Direct: 6.8 mg/dL — ABNORMAL HIGH (ref 0.0–0.2)
Indirect Bilirubin: 4.4 mg/dL — ABNORMAL HIGH (ref 0.3–0.9)
Total Bilirubin: 11.2 mg/dL — ABNORMAL HIGH (ref 0.3–1.2)
Total Protein: 5 g/dL — ABNORMAL LOW (ref 6.5–8.1)

## 2020-07-21 LAB — MAGNESIUM: Magnesium: 1.8 mg/dL (ref 1.7–2.4)

## 2020-07-21 LAB — CK: Total CK: 3102 U/L — ABNORMAL HIGH (ref 38–234)

## 2020-07-21 LAB — LIPASE, BLOOD: Lipase: 37 U/L (ref 11–51)

## 2020-07-21 IMAGING — MR MR ABDOMEN WO/W CM MRCP
19 of 22 series · 42 of 48 positions shown · IV contrast (gadavist)
Comparison: None.

CLINICAL DATA: Right upper quadrant pain, nondiagnostic ultrasound,
jaundice, concern for obstruction

EXAM:
MRI ABDOMEN WITHOUT AND WITH CONTRAST (INCLUDING MRCP)
TECHNIQUE: Multiplanar multisequence MR imaging of the abdomen was performed
both before and after the administration of intravenous contrast.
Heavily T2-weighted images of the biliary and pancreatic ducts were
obtained, and three-dimensional MRCP images were rendered by post
processing.
CONTRAST:  6mL GADAVIST GADOBUTROL 1 MMOL/ML IV SOLN

[Series 3: T2 fat-sat · axial · 6.0mm · 1.14mm/px · 1 of 42 slices shown]
[im 1/42]
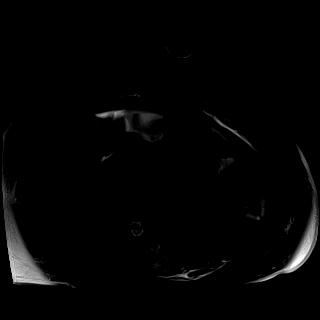

[Series 5: DWI · axial · 6.0mm · 1.36mm/px · z∈[-150,+138]mm · 2 of 82 slices shown (1 of 2)]
[im 1/82]
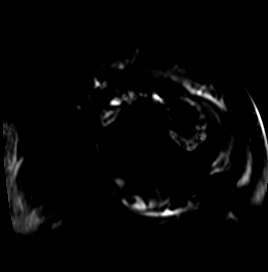
[im 82/82]
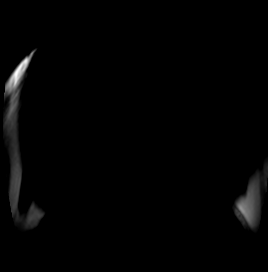

[Series 6: DWI · axial · 6.0mm · 1.36mm/px · 1 of 41 slices shown (2 of 2)]
[im 1/41]
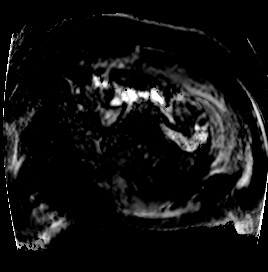

[Series 7: T1 · axial · 3.0mm · 1.09mm/px · z∈[-154,+131]mm · 3 of 96 slices shown (1 of 2)]
[im 1/96]
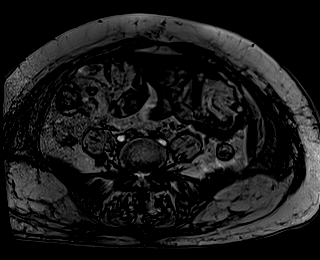
[im 48/96]
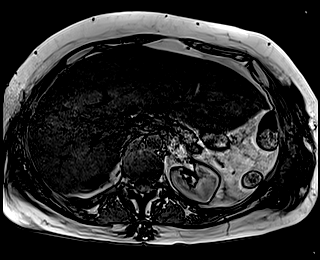
[im 96/96]
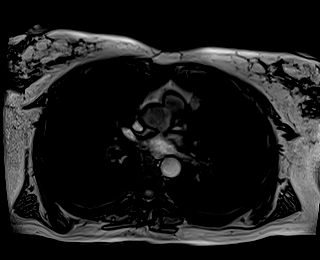

[Series 8: T1 · axial · 3.0mm · 1.09mm/px · z∈[-154,+131]mm · 3 of 96 slices shown (2 of 2)]
[im 1/96]
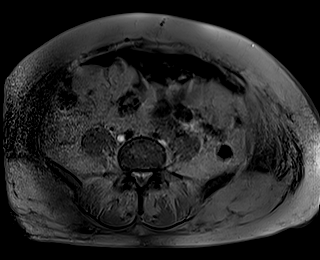
[im 48/96]
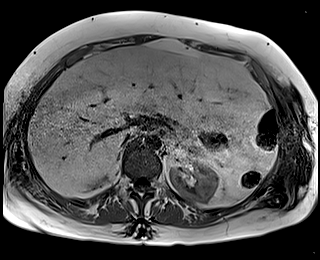
[im 96/96]
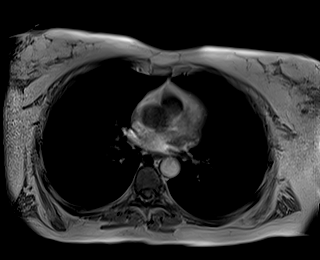

[Series 9: T2 · axial · 6.0mm · 1.37mm/px · 1 of 42 slices shown (1 of 2)]
[im 1/42]
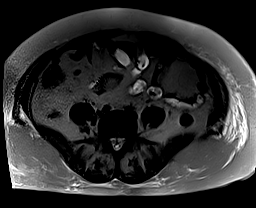

[Series 10: T2 · coronal · 6.0mm · 1.37mm/px · 1 of 32 slices shown (2 of 2)]
[im 1/32]
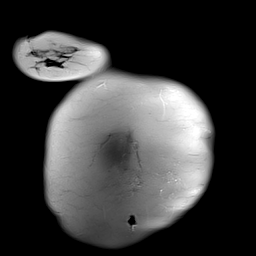

[Series 12: cor obl thk · sagittal · 50.0mm · 0.78mm/px · 1 of 9 slices shown]
[im 1/9]
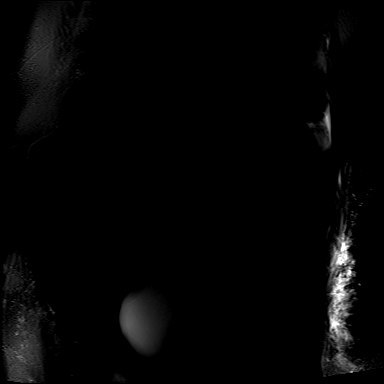

[Series 14: cor_3d_spc_trig · coronal · 1.0mm · 0.46mm/px · 2 of 80 slices shown]
[im 1/80]
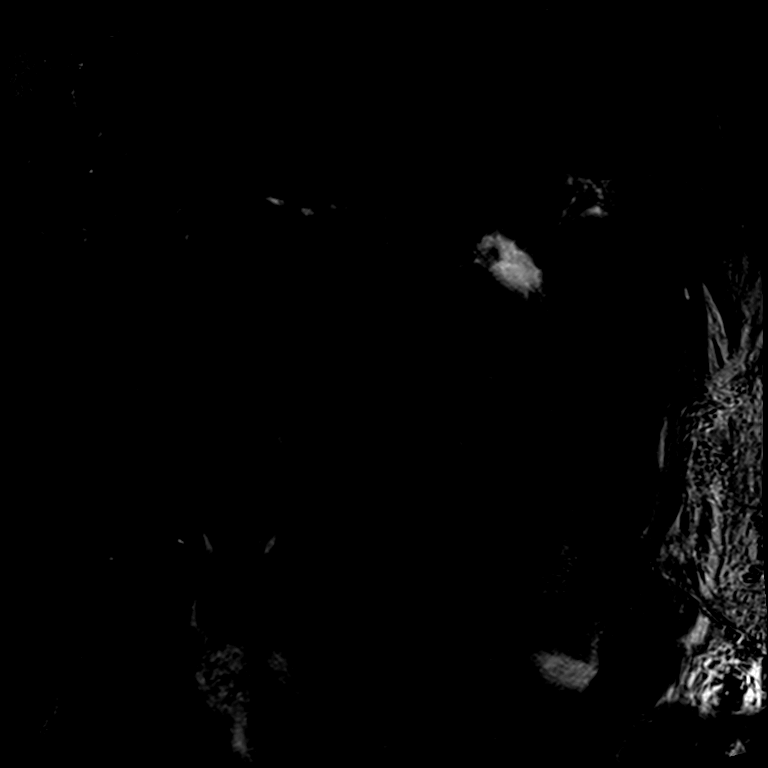
[im 80/80]
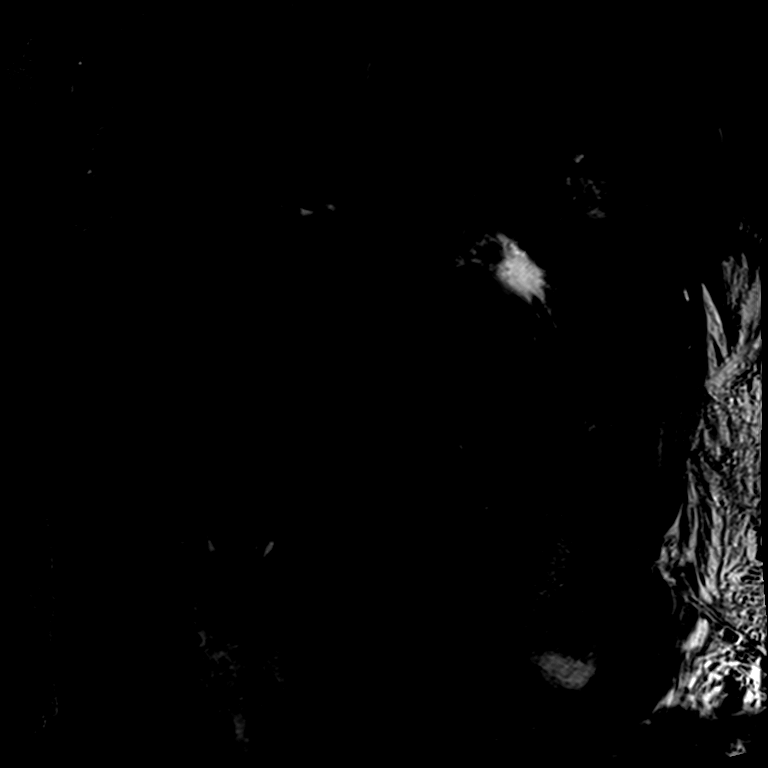

[Series 17: T1 dynamic · axial · 3.0mm · 1.09mm/px · z∈[-148,+137]mm · 3 of 96 slices shown (1 of 10)]
[im 1/96]
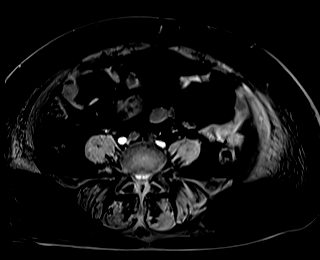
[im 48/96]
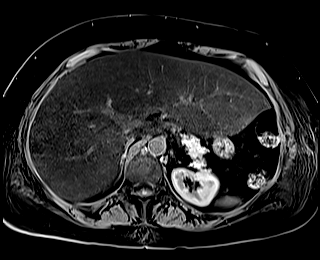
[im 96/96]
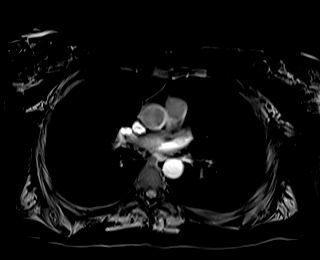

[Series 21: T1 dynamic · axial · 3.0mm · 1.09mm/px · z∈[-148,+137]mm · 3 of 96 slices shown (2 of 10)]
[im 1/96]
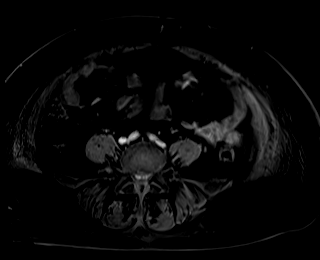
[im 48/96]
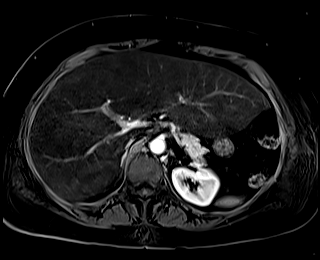
[im 96/96]
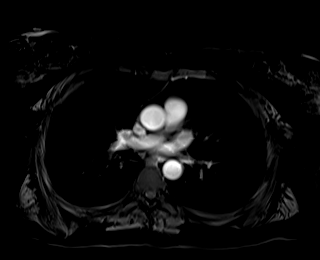

[Series 22: T1 dynamic · axial · 3.0mm · 1.09mm/px · z∈[-148,+137]mm · 3 of 96 slices shown (3 of 10)]
[im 1/96]
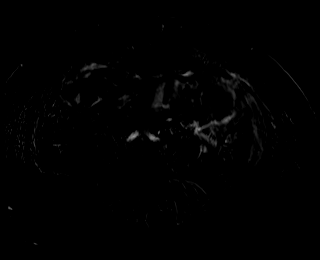
[im 48/96]
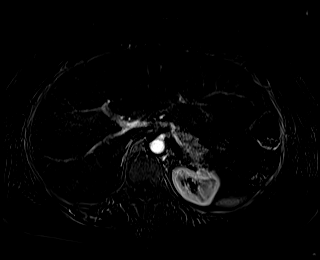
[im 96/96]
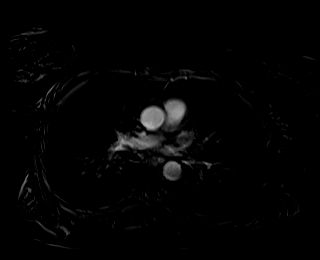

[Series 25: T1 dynamic · axial · 3.0mm · 1.09mm/px · z∈[-148,+137]mm · 3 of 96 slices shown (4 of 10)]
[im 1/96]
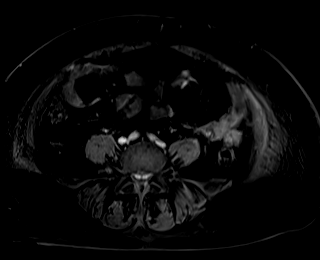
[im 48/96]
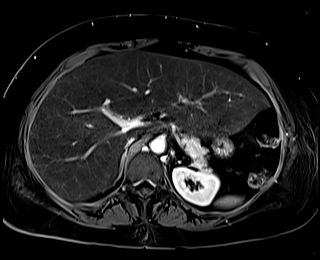
[im 96/96]
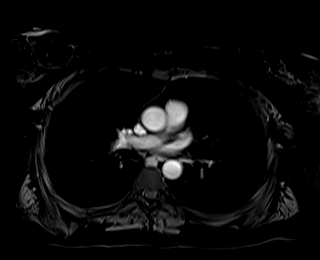

[Series 26: T1 dynamic · axial · 3.0mm · 1.09mm/px · z∈[-148,+137]mm · 3 of 96 slices shown (5 of 10)]
[im 1/96]
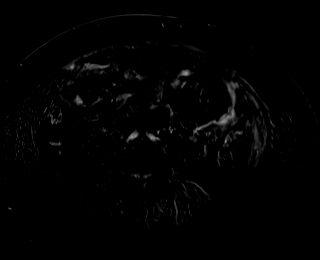
[im 48/96]
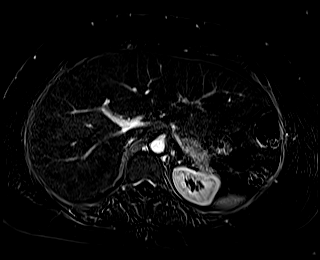
[im 96/96]
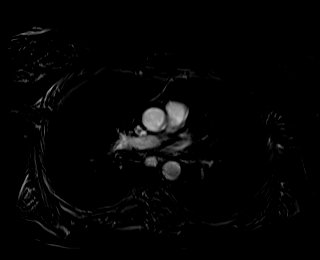

[Series 29: T1 dynamic · axial · 3.0mm · 1.09mm/px · z∈[-148,+137]mm · 3 of 96 slices shown (6 of 10)]
[im 1/96]
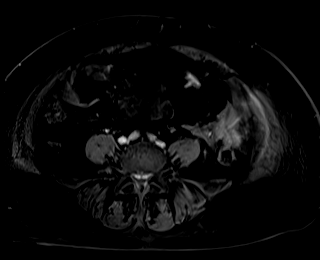
[im 48/96]
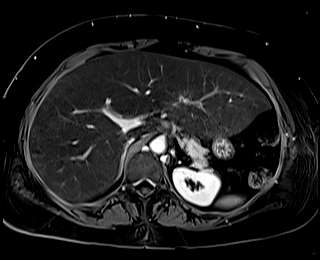
[im 96/96]
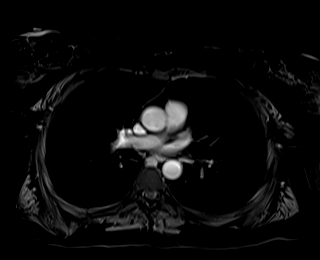

[Series 30: T1 dynamic · axial · 3.0mm · 1.09mm/px · z∈[-148,+137]mm · 3 of 96 slices shown (7 of 10)]
[im 1/96]
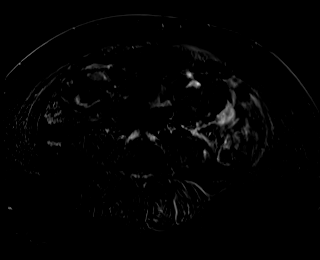
[im 48/96]
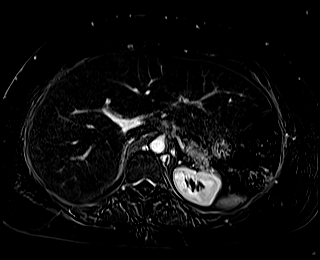
[im 96/96]
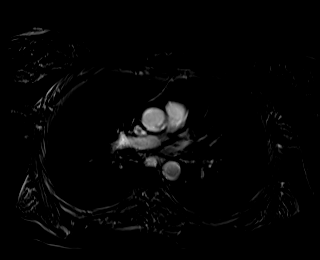

[Series 32: T1 dynamic · coronal · 3.0mm · 1.09mm/px · 2 of 80 slices shown (8 of 10)]
[im 1/80]
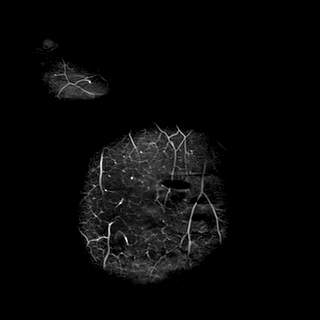
[im 80/80]
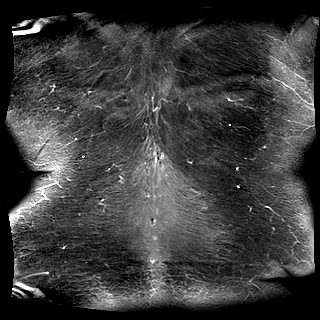

[Series 35: T1 dynamic · axial · 3.0mm · 1.09mm/px · z∈[-148,+137]mm · 3 of 96 slices shown (9 of 10)]
[im 1/96]
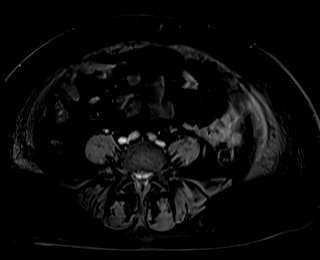
[im 48/96]
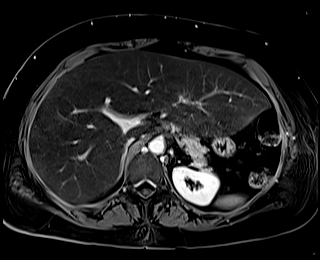
[im 96/96]
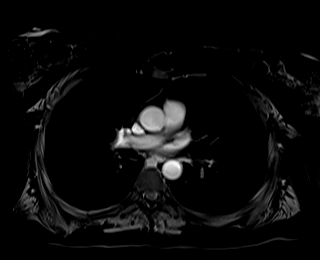

[Series 36: T1 dynamic · axial · 3.0mm · 1.09mm/px · 1 of 96 slices shown (10 of 10)]
[im 1/96]
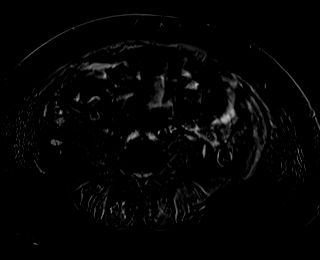

[42 of 48 positions shown; findings below may reference images not displayed]

FINDINGS: Lower chest: No acute findings.

Hepatobiliary: Hepatomegaly, maximum coronal span 21.5 cm. Hepatic
steatosis. No mass or other parenchymal abnormality identified. No
biliary ductal dilatation. Minimal layering sludge in the
gallbladder (series 9, image 35). No evidence of discrete calculi or
other obstructing lesion to the ampulla.

Pancreas: No mass, inflammatory changes, or other parenchymal
abnormality identified. No pancreatic ductal dilatation.

Spleen:  Within normal limits in size and appearance.

Adrenals/Urinary Tract: No masses identified. Hemorrhagic or
proteinaceous cyst of the inferior pole of the right kidney. No
evidence of hydronephrosis.

Stomach/Bowel: Visualized portions within the abdomen are
unremarkable.

Vascular/Lymphatic: No pathologically enlarged lymph nodes
identified. No abdominal aortic aneurysm demonstrated. Incidental
note of persistent left-sided inferior vena cava.

Other:  None.

Musculoskeletal: No suspicious bone lesions identified.
IMPRESSION: 1. No acute findings to explain pain. Minimal layering sludge in the
gallbladder without evidence of discrete gallstones or other
obstructing lesions. No biliary ductal dilatation.
2. Hepatomegaly and hepatic steatosis.

## 2020-07-21 MED ORDER — GADOBUTROL 1 MMOL/ML IV SOLN
6.0000 mL | Freq: Once | INTRAVENOUS | Status: AC | PRN
  Administered 2020-07-21: 6 mL via INTRAVENOUS

## 2020-07-21 MED ORDER — MORPHINE SULFATE (PF) 2 MG/ML IV SOLN
2.0000 mg | INTRAVENOUS | Status: DC | PRN
  Administered 2020-07-21 – 2020-08-06 (×61): 2 mg via INTRAVENOUS
  Filled 2020-07-21 (×62): qty 1

## 2020-07-21 NOTE — Progress Notes (Signed)
   07/20/20 2230  HEENT  HEENT (WDL) X  Breath smells like fetor hepaticus.

## 2020-07-21 NOTE — Progress Notes (Signed)
[facility] Progress Note  Judy Meyer 59 y.o. [birth year]  CC:  Transaminitis, RUQ abdominal pain   Subjective: Patient reports continued RUQ abdominal pain.  Has nausea but denies vomiting.  Has not had a BM since admission, Miralax started yesterday.  ROS : Review of Systems  Cardiovascular: Negative for chest pain and palpitations.  Gastrointestinal: Positive for abdominal pain, constipation and nausea. Negative for blood in stool, diarrhea, heartburn, melena and vomiting.    Objective: Vital signs in last 24 hours: Vitals:   07/20/20 2229 07/21/20 0449  BP: 109/69 137/77  Pulse: 78 73  Resp: 16   Temp: 97.9 F (36.6 C) 98 F (36.7 C)  SpO2: 100% 99%    Physical Exam:  General:  Lethargic, oriented, cooperative, no distress  Head:  Normocephalic, without obvious abnormality, atraumatic  Eyes:  Deep icterus, EOM's intact,   Lungs:   Clear to auscultation bilaterally, respirations unlabored  Heart:  Regular rate and rhythm, S1, S2 normal  Abdomen:   Soft with moderate RUQ tenderness to palpation, bowel sounds active all four quadrants,  no peritoneal signs  Extremities: Extremities normal, atraumatic, no  edema  Pulses: 2+ and symmetric    Lab Results: Recent Labs    07/20/20 0439 07/21/20 0418  NA 131* 134*  K 3.2* 3.6  CL 99 102  CO2 20* 23  GLUCOSE 99 76  BUN 7 <5*  CREATININE 0.47 0.36*  CALCIUM 7.2* 7.3*  MG 1.9 1.8   Recent Labs    07/20/20 0439 07/21/20 0418  AST 160* 139*  137*  ALT 63* 61*  59*  ALKPHOS 94 90  87  BILITOT 10.7* 11.2*  11.2*  PROT 5.3* 5.0*  5.0*  ALBUMIN 2.4* 2.3*  2.3*   Recent Labs    07/20/20 0629 07/21/20 0418  WBC 6.5 5.3  NEUTROABS 4.0 3.5  HGB 9.8* 8.8*  HCT 29.5* 27.3*  MCV 122.4* 124.1*  PLT 160 164   No results for input(s): LABPROT, INR in the last 72 hours.    Assessment: Abnormal LFTs: possible hemochromatosis. Alcoholic hepatitis less likely.  Hepatic discriminant function  <32. -T. Bili 11.2/ AST 139/ ALT 61/ ALP 90 -PT 15.7/ INR 1.3 as of 12/10 -Elevated iron saturation (88%), iron (191), decreased TIBC (216), and elevated ferritin (802).  Hemochromastosis DNA pending. -Mildly low ceruloplasmin (17.2) -Acute hepatitis panel negative -ANA, AMA, and ASMA negative -No alpha 1 antitrypsin deficiency  RUQ Abdominal pain: mildly distended fluid-filled gallbladder, no biliary dilation per CT 12/9.  RUQ [place] 12/10: Layering gallbladder sludge/stones. No definite evidence of acute Cholecystitis. Normal CBD (69mm).  Constipation, Miralax qd initiated  Plan: Await HIDA scan.  Await Hemochromatosis DNA-PCR.  Pending these results, consider liver biopsy.  Continue to trend LFTs.  Continue Miralax daily.  [facility] will follow.   Salley Slaughter PA-C 07/21/2020, 9:48 AM  Contact #  [phone]

## 2020-07-21 NOTE — Plan of Care (Signed)
  Problem: Education: Goal: Knowledge of General Education information will improve Description: Including pain rating scale, medication(s)/side effects and non-pharmacologic comfort measures Outcome: Progressing   Problem: Clinical Measurements: Goal: Ability to maintain clinical measurements within normal limits will improve Outcome: Progressing Goal: Will remain free from infection Outcome: Progressing   Problem: Nutrition: Goal: Adequate nutrition will be maintained Outcome: Progressing   Problem: Skin Integrity: Goal: Risk for impaired skin integrity will decrease Outcome: Not Progressing   Problem: Clinical Measurements: Goal: Cardiovascular complication will be avoided Outcome: Completed/Met

## 2020-07-21 NOTE — Care Management Important Message (Signed)
Important Message  Patient Details IM Letter given to the Patient. Name: Judy Meyer MRN: [id number] Date of Birth: 1961-05-13   Medicare Important Message Given:  Yes     Kerin Salen 07/21/2020, 12:37 PM

## 2020-07-21 NOTE — Progress Notes (Signed)
PROGRESS NOTE    Judy Meyer   ALP:[id number]  DOB: 04-30-1961  DOA: 07/17/2020     3  PCP: Pcp, No  CC: jaundice, abd pain, N/V, weakness, weight loss  Hospital Course: Judy Meyer is a 58  yo female with PMH hypothyroidism, hypertension, uterine fibroids, hyperlipidemia, depression who presented to the ER with worsening nausea and vomiting.  She endorsed a global decline over the past 4 months and has been acutely declining over the past several days to weeks.  She has noticed yellowing of her skin, decreased strength, and weight loss but has not been weighing herself. She denied any excessive alcohol use, tobacco use, or illicit drug use on admission.  Also does not take any significant amounts of Tylenol.  On work-up she was found to have multiple lab abnormalities.  Also underwent ultrasound abdomen which showed gallbladder stones and sludge. LFTs were elevated and she was also found to have iron excess.  GI was consulted on admission as well.    Interval History:  She endorsed more pain overnight with some nausea and stated that she stopped eating after lunch yesterday due to overall feeling worse.  Radiology is unable to perform HIDA scan due to her elevated total bilirubin which remains slightly higher today.  Stated that we would order an MRCP and follow-up results.  Old records reviewed in assessment of this patient  ROS: Constitutional: positive for anorexia, fatigue, malaise and weight loss, Respiratory: negative for cough, Cardiovascular: negative for chest pain, Gastrointestinal: positive for abdominal pain, jaundice, nausea and vomiting, negative for melena and Neurological: positive for weakness, negative for memory problems and confusion  Assessment & Plan: * Abnormal liver function - no s/s ascending cholangitis at this time (patient afebrile and no leukocytosis) - GI consulted as well, follow up recommendations - LFTs elevated; no AP elevation or biliary  dilatation so not felt to be obstructive per GI. (Ultrasound noted with cholelithiasis and sludge, no evidence of acute cholecystitis) - night of 12/12 patient developed more pain and nausea; HIDA unable to be done due to TB>4.5 per radiology; will try MRCP to evaluate CBD for any evidence of obstruction or GB pathology - NPO again for now; continue pain control  - lactic considered elevated in setting of abnormal hepatic function; does not appear septic or toxic - patient on Rocephin for possible UTI coverage which will also cover for now as well - trend LFTs - if hemochromatosis work-up negative, may still need liver biopsy per GI -Follow-up MRCP  Rhabdomyolysis - patient denied falling, trauma, or being on ground for any reason; denies muscle pains (pain complaint is RUQ radiating to side and back) - initial CK 5731 on admission. Renal function has been normal  -Trend CK, slowly coming down - continue IVF  Iron excess - elevated iron, sat ratio, and ferritin - unclear etiology; no chronic transfusions -Patient presented with abdominal pain, back pain, weakness.  Jaundiced.  LFTs abnormal. - check A1c = 5.1% - no signs of secondary hypothyroidism as TSH is elevated - follow up GI workup; testing of hemochromatosis and other etiologies at this time  Hypothyroidism -TSH elevated. (14.67>>24.52) - check FT4 (normal, 0.97) and TT3 (normal) - likely elevated TSH 2/2 acute illness; no obvious signs of myxedema at this time - repeat TSH in 4-6 weeks   Acute lower UTI - Urine culture negative  - patient on empiric treatment with Rocephin; acceptable given possible GB pathology for now  Malnutrition of moderate degree -  appreciate RD consult. Per RD "evidenced by moderate fat depletion,moderate muscle depletion,energy intake < or equal to 75% for > or equal to 1 month" - continue Ensure and diet   Macrocytic anemia -B12 is normal, folate low -Check iron panel as well in case mixed:  Iron 191 (elevated), Sat 88% (high), elevated ferritin (802), see iron excess -Continue on folic supplement  HTN (hypertension) -Continue amlodipine  Hyperlipidemia -Statin on hold for now in setting of abnormal LFTs  Hypokalemia -Replete and recheck as needed  Metabolic acidosis-resolved as of 07/21/2020 - continue NS - BMP daily    Antimicrobials: Rocephin 12/10>>current   DVT prophylaxis: SCD Code Status: Full Family Communication: none present Disposition Plan: Status is: Inpatient  Remains inpatient appropriate because:Persistent severe electrolyte disturbances, Ongoing active pain requiring inpatient pain management, Ongoing diagnostic testing needed not appropriate for outpatient work up, IV treatments appropriate due to intensity of illness or inability to take PO and Inpatient level of care appropriate due to severity of illness   Dispo: The patient is from: Home              Anticipated d/c is to: Home              Anticipated d/c date is: > 3 days              Patient currently is not medically stable to d/c.   Objective: Blood pressure 137/77, pulse 73, temperature 98 F (36.7 C), temperature source Oral, resp. rate 16, height 5\' 6"  (1.676 m), weight 63.5 kg, SpO2 99 %.  Examination: General appearance: awake, alert, on distress, grossly jaundiced Head: Normocephalic, without obvious abnormality, atraumatic Eyes: scleral icterus, EOMI Lungs: clear to auscultation bilaterally Heart: regular rate and rhythm and S1, S2 normal Abdomen: RUQ TTP with palpable GB; BS present Extremities: trace LE edema Skin: grossly jaundiced Neurologic: No focal deficits, no confusion   Consultants:   GI  Procedures:     Data Reviewed: I have personally reviewed following labs and imaging studies Results for orders placed or performed during the hospital encounter of 07/17/20 (from the past 24 hour(s))  CBC with Differential/Platelet     Status: Abnormal   Collection  Time: 07/21/20  4:18 AM  Result Value Ref Range   WBC 5.3 4.0 - 10.5 K/uL   RBC 2.20 (L) 3.87 - 5.11 MIL/uL   Hemoglobin 8.8 (L) 12.0 - 15.0 g/dL   HCT 27.3 (L) 36.0 - 46.0 %   MCV 124.1 (H) 80.0 - 100.0 fL   MCH 40.0 (H) 26.0 - 34.0 pg   MCHC 32.2 30.0 - 36.0 g/dL   RDW 18.5 (H) 11.5 - 15.5 %   Platelets 164 150 - 400 K/uL   nRBC 1.1 (H) 0.0 - 0.2 %   Neutrophils Relative % 67 %   Lymphocytes Relative 25 %   Monocytes Relative 7 %   Eosinophils Relative 1 %   Basophils Relative 0 %   Band Neutrophils 0 %   Metamyelocytes Relative 0 %   Myelocytes 0 %   Promyelocytes Relative 0 %   Blasts 0 %   nRBC 1 (H) 0 /100 WBC   Other 0 %   Neutro Abs 3.5 1.7 - 7.7 K/uL   Lymphs Abs 1.3 0.7 - 4.0 K/uL   Monocytes Absolute 0.4 0.1 - 1.0 K/uL   Eosinophils Absolute 0.1 0.0 - 0.5 K/uL   Basophils Absolute 0.0 0.0 - 0.1 K/uL   Abs Immature Granulocytes 0.00 0.00 -  0.07 K/uL  Comprehensive metabolic panel     Status: Abnormal   Collection Time: 07/21/20  4:18 AM  Result Value Ref Range   Sodium 134 (L) 135 - 145 mmol/L   Potassium 3.6 3.5 - 5.1 mmol/L   Chloride 102 98 - 111 mmol/L   CO2 23 22 - 32 mmol/L   Glucose, Bld 76 70 - 99 mg/dL   BUN <5 (L) 6 - 20 mg/dL   Creatinine, Ser 0.36 (L) 0.44 - 1.00 mg/dL   Calcium 7.3 (L) 8.9 - 10.3 mg/dL   Total Protein 5.0 (L) 6.5 - 8.1 g/dL   Albumin 2.3 (L) 3.5 - 5.0 g/dL   AST 137 (H) 15 - 41 U/L   ALT 59 (H) 0 - 44 U/L   Alkaline Phosphatase 87 38 - 126 U/L   Total Bilirubin 11.2 (H) 0.3 - 1.2 mg/dL   GFR, Estimated >60 >60 mL/min   Anion gap 9 5 - 15  Magnesium     Status: None   Collection Time: 07/21/20  4:18 AM  Result Value Ref Range   Magnesium 1.8 1.7 - 2.4 mg/dL  Hepatic function panel     Status: Abnormal   Collection Time: 07/21/20  4:18 AM  Result Value Ref Range   Total Protein 5.0 (L) 6.5 - 8.1 g/dL   Albumin 2.3 (L) 3.5 - 5.0 g/dL   AST 139 (H) 15 - 41 U/L   ALT 61 (H) 0 - 44 U/L   Alkaline Phosphatase 90 38 - 126 U/L    Total Bilirubin 11.2 (H) 0.3 - 1.2 mg/dL   Bilirubin, Direct 6.8 (H) 0.0 - 0.2 mg/dL   Indirect Bilirubin 4.4 (H) 0.3 - 0.9 mg/dL  Lipase, blood     Status: None   Collection Time: 07/21/20  4:18 AM  Result Value Ref Range   Lipase 37 11 - 51 U/L  CK     Status: Abnormal   Collection Time: 07/21/20  4:18 AM  Result Value Ref Range   Total CK 3,102 (H) 38 - 234 U/L    Recent Results (from the past 240 hour(s))  Culture, Urine     Status: None   Collection Time: 07/17/20  9:00 PM   Specimen: Urine, Random  Result Value Ref Range Status   Specimen Description   Final    URINE, RANDOM Performed at [facility], [place] [address], [place], [place] [address]    Special Requests   Final    NONE Performed at [facility], [place] [address], [place], [place] [address]    Culture   Final    NO GROWTH Performed at [facility], [place] [address], [place],  [address]    Report Status 07/19/2020 FINAL  Final  Resp Panel by RT-PCR (Flu A&B, Covid) Nasopharyngeal Swab     Status: None   Collection Time: 07/18/20 12:36 AM   Specimen: Nasopharyngeal Swab; Nasopharyngeal(NP) swabs in vial transport medium  Result Value Ref Range Status   SARS Coronavirus 2 by RT PCR NEGATIVE NEGATIVE Final    Comment: (NOTE) SARS-CoV-2 target nucleic acids are NOT DETECTED.  The SARS-CoV-2 RNA is generally detectable in upper respiratory specimens during the acute phase of infection. The lowest concentration of SARS-CoV-2 viral copies this assay can detect is 138 copies/mL. A negative result does not preclude SARS-Cov-2 infection and should not be used as the sole basis for treatment or other patient management decisions. A negative result may  occur with  improper specimen collection/handling, submission of specimen other than nasopharyngeal swab, presence of viral mutation(s) within the areas targeted by this assay, and inadequate number of viral copies(<138  copies/mL). A negative result must be combined with clinical observations, patient history, and epidemiological information. The expected result is Negative.  Fact Sheet for Patients:  EntrepreneurPulse.com.au  Fact Sheet for Healthcare Providers:  IncredibleEmployment.be  This test is no t yet approved or cleared by the [place] FDA and  has been authorized for detection and/or diagnosis of SARS-CoV-2 by FDA under an Emergency Use Authorization (EUA). This EUA will remain  in effect (meaning this test can be used) for the duration of the COVID-19 declaration under Section 564(b)(1) of the Act, 21 U.S.C.section 360bbb-3(b)(1), unless the authorization is terminated  or revoked sooner.       Influenza A by PCR NEGATIVE NEGATIVE Final   Influenza B by PCR NEGATIVE NEGATIVE Final    Comment: (NOTE) The Xpert Xpress SARS-CoV-2/FLU/RSV plus assay is intended as an aid in the diagnosis of influenza from Nasopharyngeal swab specimens and should not be used as a sole basis for treatment. Nasal washings and aspirates are unacceptable for Xpert Xpress SARS-CoV-2/FLU/RSV testing.  Fact Sheet for Patients: EntrepreneurPulse.com.au  Fact Sheet for Healthcare Providers: IncredibleEmployment.be  This test is not yet approved or cleared by the [place] FDA and has been authorized for detection and/or diagnosis of SARS-CoV-2 by FDA under an Emergency Use Authorization (EUA). This EUA will remain in effect (meaning this test can be used) for the duration of the COVID-19 declaration under Section 564(b)(1) of the Act, 21 U.S.C. section 360bbb-3(b)(1), unless the authorization is terminated or revoked.  Performed at [facility], [place] [address], [place], [place] [address]      Radiology Studies: No results found. US Abdomen Limited RUQ (LIVER/GB)  Final Result    CT Abdomen Pelvis W  Contrast  Final Result    MR ABDOMEN MRCP W WO CONTAST    (Results Pending)    Scheduled Meds: . amLODipine  5 mg Oral Daily  . feeding supplement  237 mL Oral BID BM  . folic acid  1 mg Oral Daily  . influenza vac split quadrivalent PF  0.5 mL Intramuscular Tomorrow-1000  . levothyroxine  75 mcg Oral Q0600  . polyethylene glycol  17 g Oral Daily  . thiamine  100 mg Oral Daily  . venlafaxine XR  75 mg Oral Daily   PRN Meds: morphine injection, ondansetron **OR** ondansetron (ZOFRAN) IV Continuous Infusions: . sodium chloride 100 mL/hr at 07/20/20 2304  . cefTRIAXone (ROCEPHIN)  IV 1 g (07/21/20 0144)     LOS: 3 days  Time spent: Greater than 50% of the 35 minute visit was spent in counseling/coordination of care for the patient as laid out in the A&P.   Dwyane Dee, MD [facility] 07/21/2020, 12:10 PM

## 2020-07-22 ENCOUNTER — Inpatient Hospital Stay (HOSPITAL_COMMUNITY): Payer: Medicare PPO

## 2020-07-22 DIAGNOSIS — E440 Moderate protein-calorie malnutrition: Secondary | ICD-10-CM

## 2020-07-22 DIAGNOSIS — M6282 Rhabdomyolysis: Secondary | ICD-10-CM

## 2020-07-22 LAB — HEPATIC FUNCTION PANEL
ALT: 52 U/L — ABNORMAL HIGH (ref 0–44)
AST: 115 U/L — ABNORMAL HIGH (ref 15–41)
Albumin: 2.1 g/dL — ABNORMAL LOW (ref 3.5–5.0)
Alkaline Phosphatase: 86 U/L (ref 38–126)
Bilirubin, Direct: 7.1 mg/dL — ABNORMAL HIGH (ref 0.0–0.2)
Indirect Bilirubin: 4 mg/dL — ABNORMAL HIGH (ref 0.3–0.9)
Total Bilirubin: 11.1 mg/dL — ABNORMAL HIGH (ref 0.3–1.2)
Total Protein: 4.6 g/dL — ABNORMAL LOW (ref 6.5–8.1)

## 2020-07-22 LAB — CBC WITH DIFFERENTIAL/PLATELET
Abs Immature Granulocytes: 0.07 10*3/uL (ref 0.00–0.07)
Basophils Absolute: 0 10*3/uL (ref 0.0–0.1)
Basophils Relative: 0 %
Eosinophils Absolute: 0 10*3/uL (ref 0.0–0.5)
Eosinophils Relative: 0 %
HCT: 24.8 % — ABNORMAL LOW (ref 36.0–46.0)
Hemoglobin: 8.1 g/dL — ABNORMAL LOW (ref 12.0–15.0)
Immature Granulocytes: 2 %
Lymphocytes Relative: 26 %
Lymphs Abs: 1 10*3/uL (ref 0.7–4.0)
MCH: 40.7 pg — ABNORMAL HIGH (ref 26.0–34.0)
MCHC: 32.7 g/dL (ref 30.0–36.0)
MCV: 124.6 fL — ABNORMAL HIGH (ref 80.0–100.0)
Monocytes Absolute: 0.4 10*3/uL (ref 0.1–1.0)
Monocytes Relative: 11 %
Neutro Abs: 2.3 10*3/uL (ref 1.7–7.7)
Neutrophils Relative %: 61 %
Platelets: 170 10*3/uL (ref 150–400)
RBC: 1.99 MIL/uL — ABNORMAL LOW (ref 3.87–5.11)
RDW: 18.7 % — ABNORMAL HIGH (ref 11.5–15.5)
WBC: 3.8 10*3/uL — ABNORMAL LOW (ref 4.0–10.5)
nRBC: 1.1 % — ABNORMAL HIGH (ref 0.0–0.2)

## 2020-07-22 LAB — PROTIME-INR
INR: 1.2 (ref 0.8–1.2)
Prothrombin Time: 14.5 seconds (ref 11.4–15.2)

## 2020-07-22 LAB — CK: Total CK: 2198 U/L — ABNORMAL HIGH (ref 38–234)

## 2020-07-22 LAB — BASIC METABOLIC PANEL
Anion gap: 12 (ref 5–15)
BUN: <5 mg/dL — ABNORMAL LOW (ref 6–20)
CO2: 19 mmol/L — ABNORMAL LOW (ref 22–32)
Calcium: 7.3 mg/dL — ABNORMAL LOW (ref 8.9–10.3)
Chloride: 105 mmol/L (ref 98–111)
Creatinine, Ser: 0.46 mg/dL (ref 0.44–1.00)
GFR, Estimated: >60 mL/min (ref >60)
Glucose, Bld: 100 mg/dL — ABNORMAL HIGH (ref 70–99)
Potassium: 2.9 mmol/L — ABNORMAL LOW (ref 3.5–5.1)
Sodium: 136 mmol/L (ref 135–145)

## 2020-07-22 LAB — MAGNESIUM: Magnesium: 1.6 mg/dL — ABNORMAL LOW (ref 1.7–2.4)

## 2020-07-22 LAB — GLUCOSE, CAPILLARY: Glucose-Capillary: 94 mg/dL (ref 70–99)

## 2020-07-22 IMAGING — US US BIOPSY CORE LIVER
1 series · 13 of 25 positions shown · non-contrast
Comparison: none

INDICATION: 59-year-old female with a history of liver failure

[Series 1: us biopsy core liver · 13 of 25 slices shown]
[im 1/25]
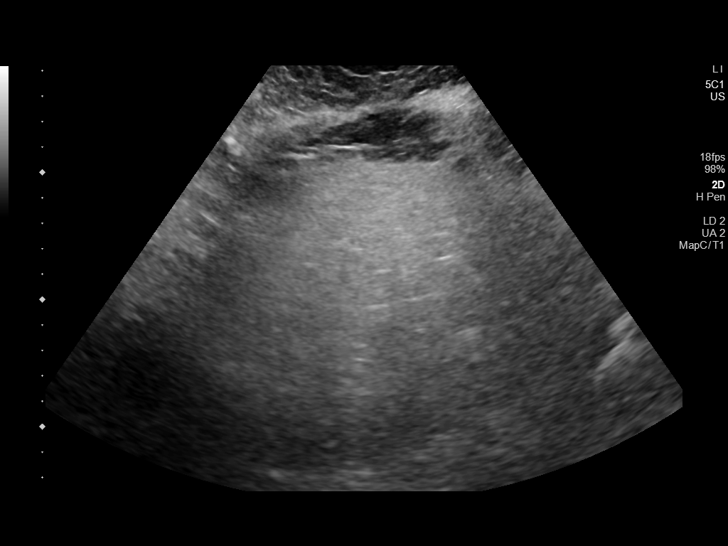
[im 3/25]
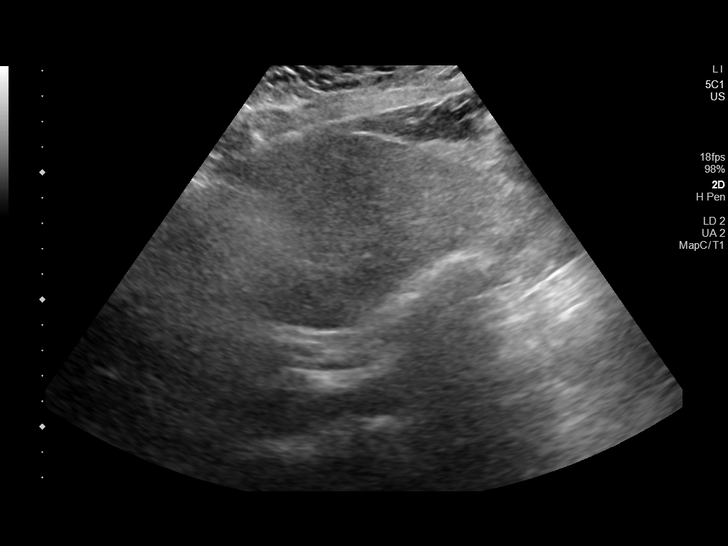
[im 5/25]
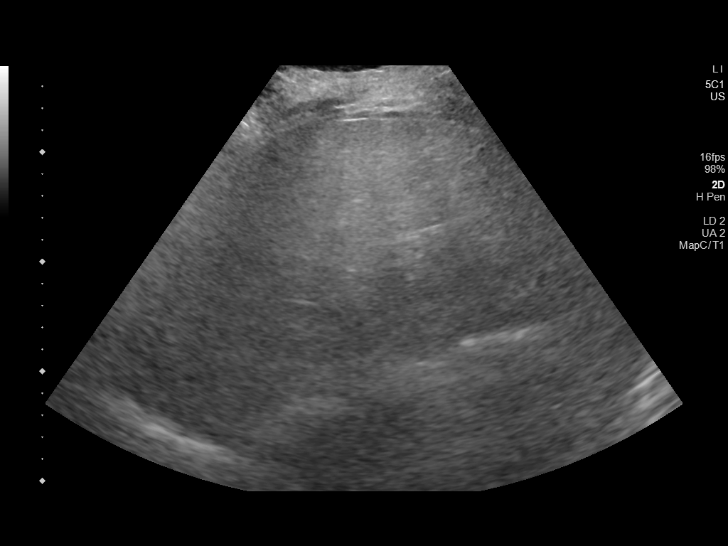
[im 7/25]
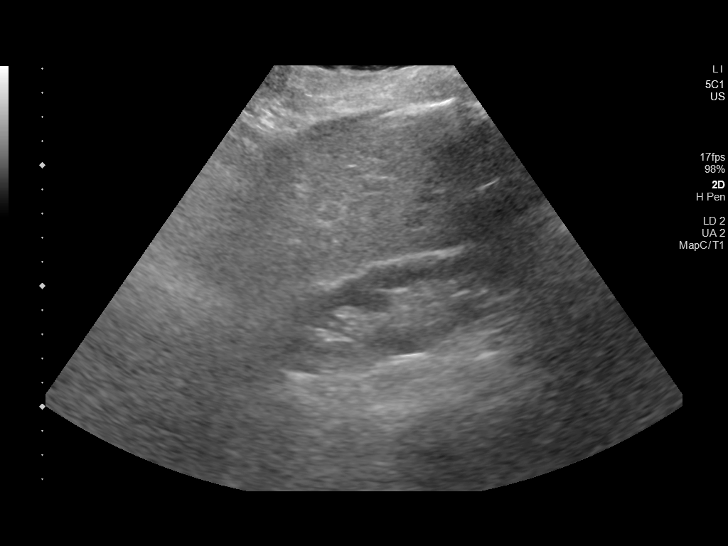
[im 9/25]
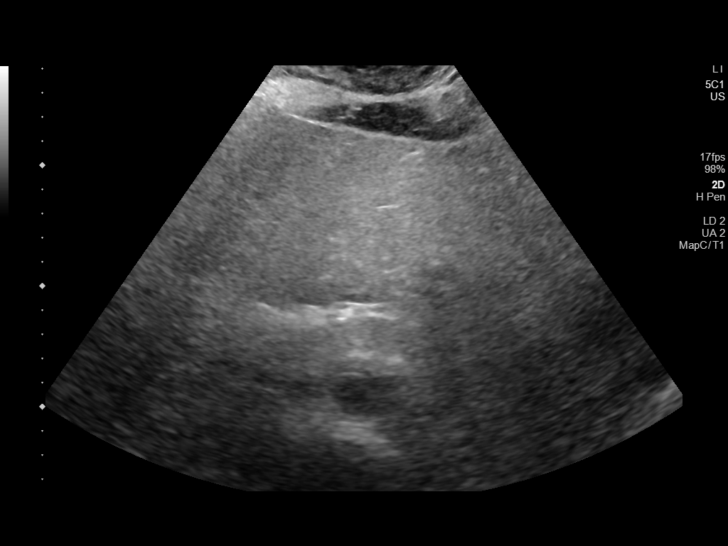
[im 11/25]
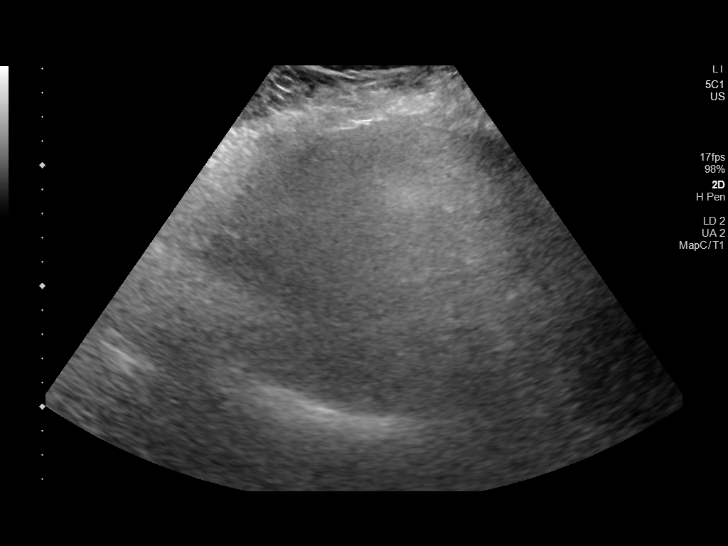
[im 13/25]
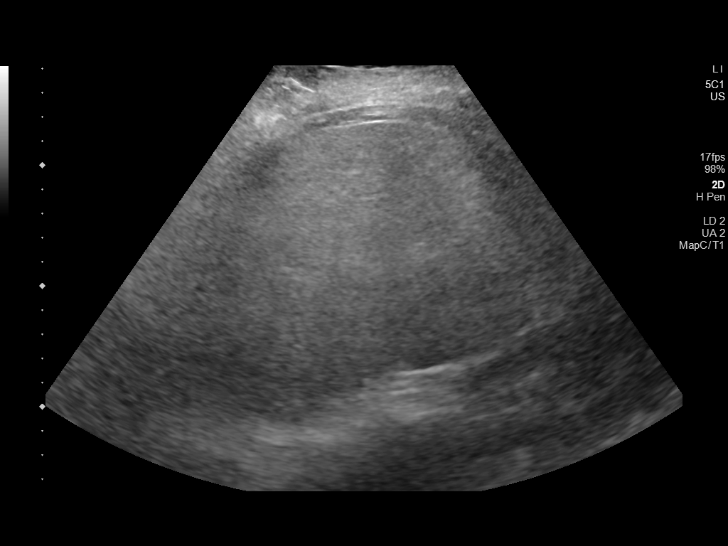
[im 15/25]
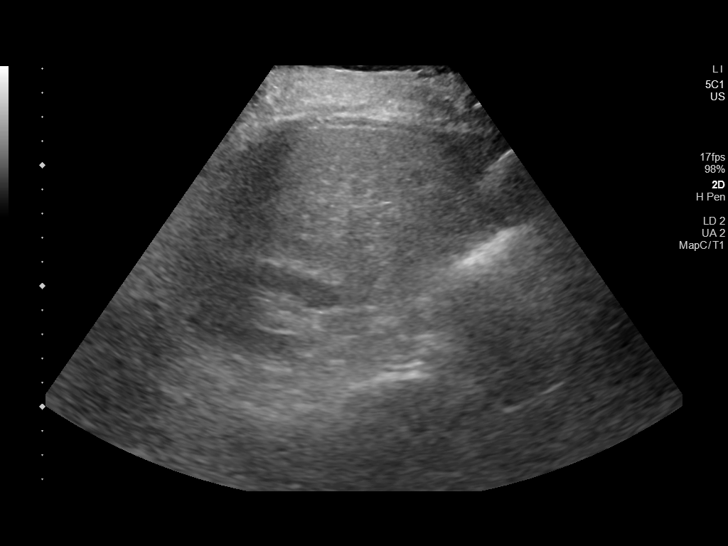
[im 17/25]
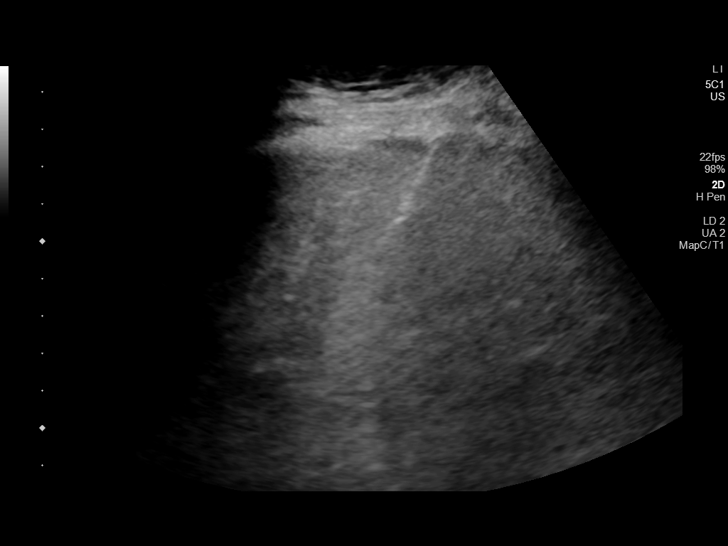
[im 19/25]
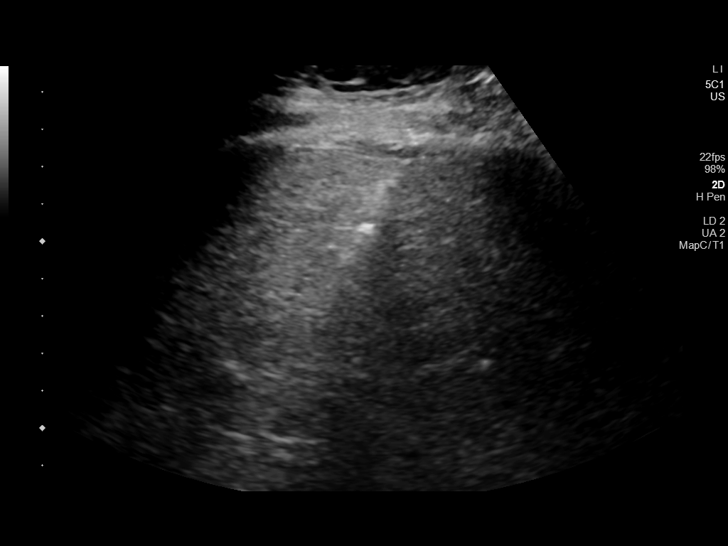
[im 21/25]
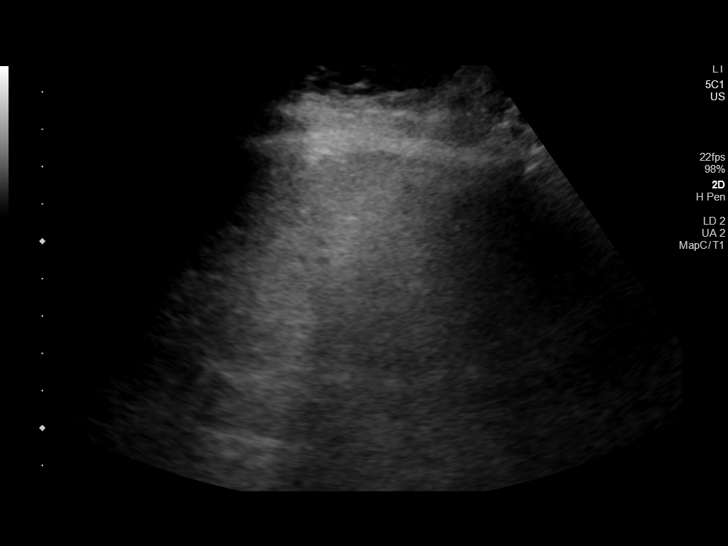
[im 23/25]
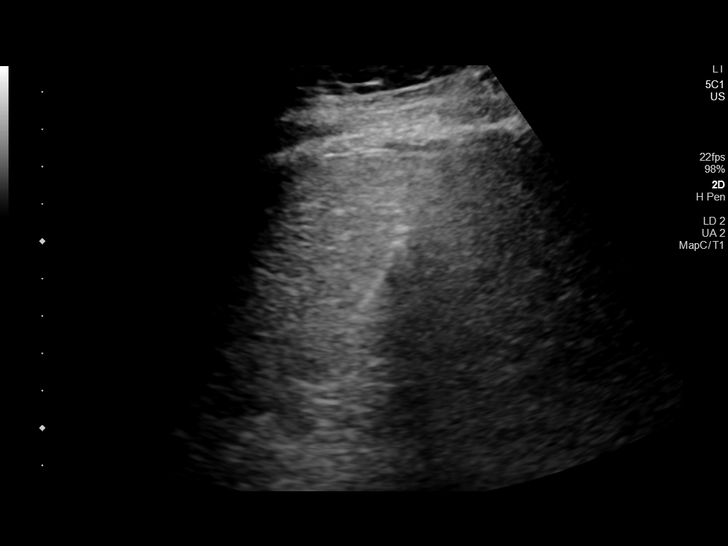
[im 25/25]
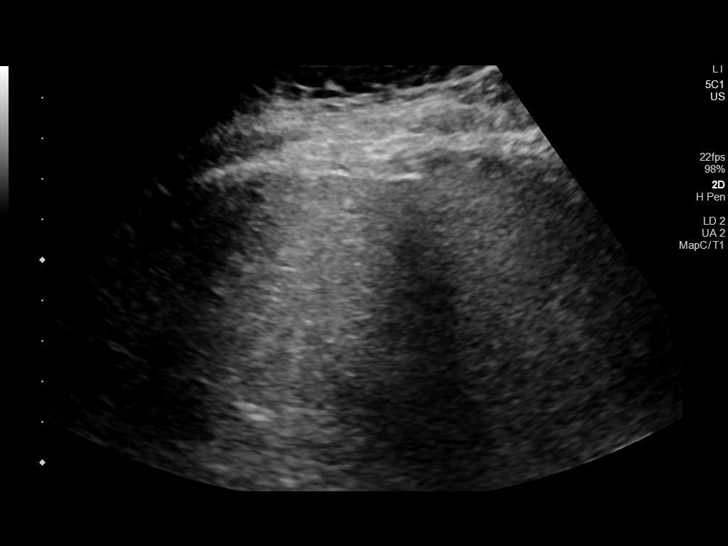

[13 of 25 positions shown; findings below may reference images not displayed]

EXAM:
ULTRASOUND-GUIDED MEDICAL LIVER BIOPSY

MEDICATIONS:
None.

ANESTHESIA/SEDATION:
Moderate (conscious) sedation was employed during this procedure. A
total of Versed 2.0 mg and Fentanyl 100 mcg was administered
intravenously.

Moderate Sedation Time: 10 minutes. The patient's level of
consciousness and vital signs were monitored continuously by
radiology nursing throughout the procedure under my direct
supervision.

FLUOROSCOPY TIME:  Ultrasound

COMPLICATIONS:
None

PROCEDURE:
Informed written consent was obtained from the patient after a
thorough discussion of the procedural risks, benefits and
alternatives. All questions were addressed. Maximal Sterile Barrier
Technique was utilized including caps, mask, sterile gowns, sterile
gloves, sterile drape, hand hygiene and skin antiseptic. A timeout
was performed prior to the initiation of the procedure.

Ultrasound survey of the right liver lobe performed with images
stored and sent to PACs.

The right lower thorax/right upper abdomen was prepped with
chlorhexidine in a sterile fashion, and a sterile drape was applied
covering the operative field. A sterile gown and sterile gloves were
used for the procedure. Local anesthesia was provided with 1%
Lidocaine.

The patient was prepped and draped sterilely and the skin and
subcutaneous tissues were generously infiltrated with 1% lidocaine.

A 17 gauge introducer needle was then advanced under ultrasound
guidance in an intercostal location into the right liver lobe. The
stylet was removed, and multiple separate 18 gauge core biopsy were
retrieved. Samples were placed into fresh specimen/saline for
transportation to the lab.

Gel-Foam pledgets were then infused with a small amount of saline
for assistance with hemostasis.

The needle was removed, and a final ultrasound image was performed.

The patient tolerated the procedure well and remained
hemodynamically stable throughout.

No complications were encountered and no significant blood loss was
encounter.
IMPRESSION: Status post ultrasound-guided medical liver biopsy.

## 2020-07-22 MED ORDER — MIDAZOLAM HCL 2 MG/2ML IJ SOLN
INTRAMUSCULAR | Status: AC | PRN
  Administered 2020-07-22 (×2): 1 mg via INTRAVENOUS

## 2020-07-22 MED ORDER — FENTANYL CITRATE (PF) 100 MCG/2ML IJ SOLN
INTRAMUSCULAR | Status: AC | PRN
  Administered 2020-07-22 (×2): 50 ug via INTRAVENOUS

## 2020-07-22 MED ORDER — LIDOCAINE HCL 1 % IJ SOLN
INTRAMUSCULAR | Status: AC
  Filled 2020-07-22: qty 20

## 2020-07-22 MED ORDER — MIDAZOLAM HCL 2 MG/2ML IJ SOLN
INTRAMUSCULAR | Status: AC
  Filled 2020-07-22: qty 2

## 2020-07-22 MED ORDER — POTASSIUM CHLORIDE CRYS ER 20 MEQ PO TBCR
40.0000 meq | EXTENDED_RELEASE_TABLET | Freq: Once | ORAL | Status: AC
  Administered 2020-07-22: 40 meq via ORAL
  Filled 2020-07-22: qty 2

## 2020-07-22 MED ORDER — GELATIN ABSORBABLE 12-7 MM EX MISC
CUTANEOUS | Status: AC
  Filled 2020-07-22: qty 1

## 2020-07-22 MED ORDER — POTASSIUM CHLORIDE IN NACL 40-0.9 MEQ/L-% IV SOLN
INTRAVENOUS | Status: DC
  Filled 2020-07-22 (×2): qty 1000

## 2020-07-22 MED ORDER — FENTANYL CITRATE (PF) 100 MCG/2ML IJ SOLN
INTRAMUSCULAR | Status: AC
  Filled 2020-07-22: qty 2

## 2020-07-22 MED ORDER — MAGNESIUM SULFATE 2 GM/50ML IV SOLN
2.0000 g | Freq: Once | INTRAVENOUS | Status: AC
  Administered 2020-07-22: 2 g via INTRAVENOUS
  Filled 2020-07-22: qty 50

## 2020-07-22 NOTE — Progress Notes (Signed)
Patient ID: Judy Meyer, female   DOB: 08-Aug-1961, 59 y.o.   MRN: [id number]  PROGRESS NOTE    Judy Meyer  ZTI:[id number] DOB: 1960-11-02 DOA: 07/17/2020 PCP: Pcp, No   Brief Narrative:  59  yo female with PMH hypothyroidism, hypertension, uterine fibroids, hyperlipidemia, depression who presented to the ER with worsening nausea and vomiting with jaundice, decreased strength, weight loss. On work-up she was found to have multiple lab abnormalities.  Also underwent ultrasound abdomen which showed gallbladder stones and sludge. LFTs were elevated and she was also found to have iron excess.  GI was consulted on admission as well.   Assessment & Plan:   Abdominal pain Abnormal LFTs -Ultrasound showed cholelithiasis and sludge but no evidence of acute cholecystitis. -Unable to do HIDA because of total bilirubin being more than 4.5 as per radiology -MRCP was negative for any acute findings without evidence of gallstones, obstruction lesions or biliary dilatation -GI following.  May need liver biopsy. -DC antibiotics. -Still has abdominal pain requiring IV morphine.  Rhabdomyolysis -CK 5731 on admission with normal renal function.  Currently on IV fluids.  CK total improving to 2198 today.  Iron excess - elevated iron, sat ratio, and ferritin - unclear etiology; no chronic transfusions - follow up GI workup; testing of hemochromatosis and other etiologies at this time  Hypothyroidism -Continue levothyroxine  Acute lower UTI -Culture negative.  DC Rocephin.  Moderate malnutrition -Follow nutrition recommendations  Macrocytic anemia -B12 normal.  Folate low.  Continue folic acid supplementation  Hypertension -Continue amlodipine  Hyperlipidemia -Statin on hold because of abnormal LFTs  Hypokalemia -Replace.  Repeat a.m. labs  Hypomagnesemia -Replace.  Repeat a.m. labs  DVT prophylaxis: SCDs Code Status: Full Family Communication: None at bedside Disposition  Plan: Status is: Inpatient  Remains inpatient appropriate because:Inpatient level of care appropriate due to severity of illness   Dispo: The patient is from: Home              Anticipated d/c is to: Home              Anticipated d/c date is: 3 days              Patient currently is not medically stable to d/c.   Consultants: GI  Procedures: None  Antimicrobials: Rocephin from 07/18/2020 onwards   Subjective: Patient seen and examined at bedside.  Still complains of intermittent nausea and dry heaving with intermittent abdominal pain.  No overnight fever.  No chest pain, worsening shortness of breath reported.  Objective: Vitals:   07/21/20 0449 07/21/20 1235 07/21/20 2139 07/22/20 0537  BP: 137/77 (!) 143/76 101/62 128/73  Pulse: 73 76 76 73  Resp:  (!) 22 18 20   Temp: 98 F (36.7 C) 98.5 F (36.9 C) 98 F (36.7 C) 98 F (36.7 C)  TempSrc: Oral Oral Oral   SpO2: 99% 100% 98% 100%  Weight:      Height:        Intake/Output Summary (Last 24 hours) at 07/22/2020 1021 Last data filed at 07/22/2020 0600 Gross per 24 hour  Intake 2847.56 ml  Output 1900 ml  Net 947.56 ml   Filed Weights   07/17/20 2016  Weight: 63.5 kg    Examination:  General exam: Appears calm and comfortable.  Looks chronically ill.  Icterus present. Respiratory system: Bilateral decreased breath sounds at bases with some scattered crackles Cardiovascular system: S1 & S2 heard, Rate controlled Gastrointestinal system: Abdomen is nondistended, soft and mildly  tender in the right upper quadrant.  Normal bowel sounds heard. Extremities: No cyanosis, clubbing; trace lower extremity edema present  Central nervous system: Alert and oriented. No focal neurological deficits. Moving extremities Skin: No rashes, lesions or ulcers Psychiatry: Flat affect.    Data Reviewed: I have personally reviewed following labs and imaging studies  CBC: Recent Labs  Lab 07/18/20 0500 07/19/20 0511  07/20/20 0629 07/21/20 0418 07/22/20 0430  WBC 7.4 6.2 6.5 5.3 3.8*  NEUTROABS 5.4 4.1 4.0 3.5 2.3  HGB 9.7* 9.0* 9.8* 8.8* 8.1*  HCT 29.0* 27.6* 29.5* 27.3* 24.8*  MCV 117.9* 122.7* 122.4* 124.1* 124.6*  PLT 155 147* 160 164 347   Basic Metabolic Panel: Recent Labs  Lab 07/18/20 0132 07/18/20 0500 07/19/20 0511 07/20/20 0439 07/21/20 0418 07/22/20 0430  NA  --  137 135 131* 134* 136  K  --  3.2* 3.4* 3.2* 3.6 2.9*  CL  --  98 98 99 102 105  CO2  --  23 23 20* 23 19*  GLUCOSE  --  101* 70 99 76 100*  BUN  --  11 10 7  <5* <5*  CREATININE  --  0.69 0.63 0.47 0.36* 0.46  CALCIUM  --  7.6* 7.2* 7.2* 7.3* 7.3*  MG 1.5* 1.5* 2.3 1.9 1.8 1.6*  PHOS 2.0* 2.0*  --   --   --   --    GFR: Estimated Creatinine Clearance: 71.8 mL/min (by C-G formula based on SCr of 0.46 mg/dL). Liver Function Tests: Recent Labs  Lab 07/18/20 0500 07/19/20 0511 07/20/20 0439 07/21/20 0418 07/22/20 0430  AST 217* 165* 160* 139*  137* 115*  ALT 73* 64* 63* 61*  59* 52*  ALKPHOS 95 85 94 90  87 86  BILITOT 10.7* 8.3* 10.7* 11.2*  11.2* 11.1*  PROT 5.6* 5.1* 5.3* 5.0*  5.0* 4.6*  ALBUMIN 2.7* 2.4* 2.4* 2.3*  2.3* 2.1*   Recent Labs  Lab 07/17/20 2021 07/19/20 0603 07/21/20 0418  LIPASE 53* 35 37   Recent Labs  Lab 07/18/20 0500  AMMONIA 44*   Coagulation Profile: Recent Labs  Lab 07/18/20 0132 07/18/20 0500  INR 1.3* 1.3*   Cardiac Enzymes: Recent Labs  Lab 07/18/20 0132 07/20/20 0629 07/21/20 0418 07/22/20 0430  CKTOTAL 5,734* 3,532* 3,102* 2,198*   BNP (last 3 results) No results for input(s): PROBNP in the last 8760 hours. HbA1C: No results for input(s): HGBA1C in the last 72 hours. CBG: No results for input(s): GLUCAP in the last 168 hours. Lipid Profile: No results for input(s): CHOL, HDL, LDLCALC, TRIG, CHOLHDL, LDLDIRECT in the last 72 hours. Thyroid Function Tests: No results for input(s): TSH, T4TOTAL, FREET4, T3FREE, THYROIDAB in the last 72  hours. Anemia Panel: No results for input(s): VITAMINB12, FOLATE, FERRITIN, TIBC, IRON, RETICCTPCT in the last 72 hours. Sepsis Labs: Recent Labs  Lab 07/18/20 0139 07/18/20 0747 07/18/20 1052  LATICACIDVEN 4.2* 2.6* 2.5*    Recent Results (from the past 240 hour(s))  Culture, Urine     Status: None   Collection Time: 07/17/20  9:00 PM   Specimen: Urine, Random  Result Value Ref Range Status   Specimen Description   Final    URINE, RANDOM Performed at [facility] [address] [place], [place] [address]    Special Requests   Final    NONE Performed at [facility], [place] [address], [place], [place] [address]    Culture   Final    NO  GROWTH Performed at [facility], [place] [address] [place], [place] [address]    Report Status 07/19/2020 FINAL  Final  Resp Panel by RT-PCR (Flu A&B, Covid) Nasopharyngeal Swab     Status: None   Collection Time: 07/18/20 12:36 AM   Specimen: Nasopharyngeal Swab; Nasopharyngeal(NP) swabs in vial transport medium  Result Value Ref Range Status   SARS Coronavirus 2 by RT PCR NEGATIVE NEGATIVE Final    Comment: (NOTE) SARS-CoV-2 target nucleic acids are NOT DETECTED.  The SARS-CoV-2 RNA is generally detectable in upper respiratory specimens during the acute phase of infection. The lowest concentration of SARS-CoV-2 viral copies this assay can detect is 138 copies/mL. A negative result does not preclude SARS-Cov-2 infection and should not be used as the sole basis for treatment or other patient management decisions. A negative result may occur with  improper specimen collection/handling, submission of specimen other than nasopharyngeal swab, presence of viral mutation(s) within the areas targeted by this assay, and inadequate number of viral copies(<138 copies/mL). A negative result must be combined with clinical observations, patient history, and epidemiological information. The expected result  is Negative.  Fact Sheet for Patients:  EntrepreneurPulse.com.au  Fact Sheet for Healthcare Providers:  IncredibleEmployment.be  This test is no t yet approved or cleared by the [place] FDA and  has been authorized for detection and/or diagnosis of SARS-CoV-2 by FDA under an Emergency Use Authorization (EUA). This EUA will remain  in effect (meaning this test can be used) for the duration of the COVID-19 declaration under Section 564(b)(1) of the Act, 21 U.S.C.section 360bbb-3(b)(1), unless the authorization is terminated  or revoked sooner.       Influenza A by PCR NEGATIVE NEGATIVE Final   Influenza B by PCR NEGATIVE NEGATIVE Final    Comment: (NOTE) The Xpert Xpress SARS-CoV-2/FLU/RSV plus assay is intended as an aid in the diagnosis of influenza from Nasopharyngeal swab specimens and should not be used as a sole basis for treatment. Nasal washings and aspirates are unacceptable for Xpert Xpress SARS-CoV-2/FLU/RSV testing.  Fact Sheet for Patients: EntrepreneurPulse.com.au  Fact Sheet for Healthcare Providers: IncredibleEmployment.be  This test is not yet approved or cleared by the [place] FDA and has been authorized for detection and/or diagnosis of SARS-CoV-2 by FDA under an Emergency Use Authorization (EUA). This EUA will remain in effect (meaning this test can be used) for the duration of the COVID-19 declaration under Section 564(b)(1) of the Act, 21 U.S.C. section 360bbb-3(b)(1), unless the authorization is terminated or revoked.  Performed at [facility], [place] [address] [place],  [address]          Radiology Studies: MR 3D Recon At Scanner  Result Date: 07/21/2020 CLINICAL DATA:  Right upper quadrant pain, nondiagnostic ultrasound, jaundice, concern for obstruction EXAM: MRI ABDOMEN WITHOUT AND WITH CONTRAST (INCLUDING MRCP) TECHNIQUE:  Multiplanar multisequence MR imaging of the abdomen was performed both before and after the administration of intravenous contrast. Heavily T2-weighted images of the biliary and pancreatic ducts were obtained, and three-dimensional MRCP images were rendered by post processing. CONTRAST:  49mL GADAVIST GADOBUTROL 1 MMOL/ML IV SOLN COMPARISON:  None. FINDINGS: Lower chest: No acute findings. Hepatobiliary: Hepatomegaly, maximum coronal span 21.5 cm. Hepatic steatosis. No mass or other parenchymal abnormality identified. No biliary ductal dilatation. Minimal layering sludge in the gallbladder (series 9, image 35). No evidence of discrete calculi or other obstructing lesion to the ampulla. Pancreas: No mass, inflammatory changes, or other parenchymal abnormality identified. No  pancreatic ductal dilatation. Spleen:  Within normal limits in size and appearance. Adrenals/Urinary Tract: No masses identified. Hemorrhagic or proteinaceous cyst of the inferior pole of the right kidney. No evidence of hydronephrosis. Stomach/Bowel: Visualized portions within the abdomen are unremarkable. Vascular/Lymphatic: No pathologically enlarged lymph nodes identified. No abdominal aortic aneurysm demonstrated. 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 Other:  None. Musculoskeletal: No suspicious bone lesions identified. IMPRESSION: 1. No acute findings to explain pain. Minimal layering sludge in the gallbladder without evidence of discrete gallstones or other obstructing lesions. No biliary ductal dilatation. 2. Hepatomegaly and hepatic steatosis. Electronically Signed   By: Eddie Candle M.D.   On: 07/21/2020 15:36   MR ABDOMEN MRCP W WO CONTAST  Result Date: 07/21/2020 CLINICAL DATA:  Right upper quadrant pain, nondiagnostic ultrasound, jaundice, concern for obstruction EXAM: MRI ABDOMEN WITHOUT AND WITH CONTRAST (INCLUDING MRCP) TECHNIQUE: Multiplanar multisequence MR imaging of the abdomen was performed both  before and after the administration of intravenous contrast. Heavily T2-weighted images of the biliary and pancreatic ducts were obtained, and three-dimensional MRCP images were rendered by post processing. CONTRAST:  43mL GADAVIST GADOBUTROL 1 MMOL/ML IV SOLN COMPARISON:  None. FINDINGS: Lower chest: No acute findings. Hepatobiliary: Hepatomegaly, maximum coronal span 21.5 cm. Hepatic steatosis. No mass or other parenchymal abnormality identified. No biliary ductal dilatation. Minimal layering sludge in the gallbladder (series 9, image 35). No evidence of discrete calculi or other obstructing lesion to the ampulla. Pancreas: No mass, inflammatory changes, or other parenchymal abnormality identified. No pancreatic ductal dilatation. Spleen:  Within normal limits in size and appearance. Adrenals/Urinary Tract: No masses identified. Hemorrhagic or proteinaceous cyst of the inferior pole of the right kidney. No evidence of hydronephrosis. Stomach/Bowel: Visualized portions within the abdomen are unremarkable. Vascular/Lymphatic: No pathologically enlarged lymph nodes identified. No abdominal aortic aneurysm demonstrated. 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 Other:  None. Musculoskeletal: No suspicious bone lesions identified. IMPRESSION: 1. No acute findings to explain pain. Minimal layering sludge in the gallbladder without evidence of discrete gallstones or other obstructing lesions. No biliary ductal dilatation. 2. Hepatomegaly and hepatic steatosis. Electronically Signed   By: Eddie Candle M.D.   On: 07/21/2020 15:36        Scheduled Meds: . amLODipine  5 mg Oral Daily  . feeding supplement  237 mL Oral BID BM  . folic acid  1 mg Oral Daily  . levothyroxine  75 mcg Oral Q0600  . polyethylene glycol  17 g Oral Daily  . thiamine  100 mg Oral Daily  . venlafaxine XR  75 mg Oral Daily   Continuous Infusions: . 0.9 % NaCl with KCl 40 mEq / L 75 mL/hr at 07/22/20 0916  .  cefTRIAXone (ROCEPHIN)  IV 1 g (07/22/20 0220)          Aline August, MD [facility] 07/22/2020, 10:21 AM

## 2020-07-22 NOTE — Procedures (Signed)
Interventional Radiology Procedure Note  Procedure: US guided biopsy of liver for medical liver purpose Complications: None EBL: None Recommendations: - Bedrest 2 hours.  Ambulate per primary - Routine wound care - Follow up pathology - Advance diet ok, per primary order  Signed,  Corrie Mckusick, DO

## 2020-07-22 NOTE — Consult Note (Signed)
Chief Complaint: Patient was seen in consultation today for image guided random core liver biopsy Chief Complaint  Patient presents with  . Vomiting  . Abdominal Pain    Referring Physician(s): Schooler,V  Supervising Physician: Corrie Mckusick  Patient Status: [facility] - In-pt  History of Present Illness: Judy Meyer is a 59 y.o. female with past medical history of hypertension, hypothyroidism, uterine fibroids, hyperlipidemia and depression who was admitted to [facility] on 12/9 with abdominal pain, nausea, vomiting, jaundice, weakness and weight loss.  Laboratory evaluation revealed hypokalemia, elevated LFTs with total bilirubin currently 11.1, negative acute hepatitis panel, negative ANA/AMA and ASMA, no alpha 1 antitrypsin deficiency.  Patient did have elevated iron saturation, iron, ferritin and decreased TIBC.  Hemochromatosis DNA pending.  MRI of the abdomen revealed no acute findings.  There was minimal layering sludge in the gallbladder without evidence of discrete gallstones or obstructing lesions, no biliary ductal dilatation.  Hepatomegaly and hepatic steatosis were noted.  Also present was stable 2 cm right-sided renal angiomyolipoma, nonobstructing right renal calculus, uterine fibroids and mildly distended fluid-filled gallbladder with no definite evidence of surrounding inflammatory changes.  Request now received from GI service for image guided random core liver biopsy.  Patient is COVID-19 negative.  Past Medical History:  Diagnosis Date  . Hypertension   . Hypothyroidism     History reviewed. No pertinent surgical history.  Allergies: Compazine [prochlorperazine edisylate], Periactin [cyproheptadine], Cyclobenzaprine, Haloperidol and related, Metoclopramide, Other, and Prochlorperazine  Medications: Prior to Admission medications   Medication Sig Start Date End Date Taking? Authorizing Provider  albuterol (VENTOLIN HFA) 108 (90 Base) MCG/ACT inhaler  Inhale 2 puffs into the lungs 4 (four) times daily as needed for wheezing or shortness of breath. 12/20/19  Yes [provider]  amLODipine (NORVASC) 5 MG tablet Take 1 tablet (5 mg total) by mouth daily. 04/08/20 05/08/20 Yes Guilford Shi, MD  eszopiclone (LUNESTA) 1 MG TABS tablet Take 1 mg by mouth at bedtime as needed for sleep. 02/11/20  Yes [provider]  Galcanezumab-gnlm 120 MG/ML SOAJ Inject 120 mLs into the skin every 30 (thirty) days.   Yes [provider]  levothyroxine (SYNTHROID, LEVOTHROID) 75 MCG tablet Take 75 mcg by mouth daily before breakfast.   Yes [provider]  norethindrone-ethinyl estradiol (NORTREL 7/7/7) 0.5/0.75/1-35 MG-MCG tablet Take 1 tablet by mouth daily.   Yes [provider]  rosuvastatin (CRESTOR) 5 MG tablet Take 5 mg by mouth daily.   Yes [provider]  venlafaxine XR (EFFEXOR-XR) 75 MG 24 hr capsule Take 75 mg by mouth daily.   Yes [provider]     Family History  Problem Relation Age of Onset  . Hypertension Other     Social History   Socioeconomic History  . Marital status: Single    Spouse name: Not on file  . Number of children: Not on file  . Years of education: Not on file  . Highest education level: Not on file  Occupational History  . Not on file  Tobacco Use  . Smoking status: Never Smoker  . Smokeless tobacco: Never Used  Vaping Use  . Vaping Use: Never used  Substance and Sexual Activity  . Alcohol use: Yes    Comment: occasionally  . Drug use: Not Currently  . Sexual activity: Yes  Other Topics Concern  . Not on file  Social History Narrative  . Not on file   Social Determinants of Health   Financial  Resource Strain: Not on file  Food Insecurity: Not on file  Transportation Needs: Not on file  Physical Activity: Not on file  Stress: Not on file  Social Connections: Not on file      Review of Systems currently denies fever, headache, chest pain,  dyspnea, cough, back pain, nausea, vomiting or bleeding.  She does have some right upper quadrant/epigastric discomfort; she has had some constipation.  Vital Signs: BP 128/73 (BP Location: Right Arm)   Pulse 73   Temp 98 F (36.7 C)   Resp 20   Ht 5\' 6"  (1.676 m)   Wt 140 lb (63.5 kg)   SpO2 100%   BMI 22.60 kg/m   Physical Exam awake, alert.  Scleral icterus.  Chest clear to auscultation bilaterally.  Heart with regular rate and rhythm.  Abdomen soft, positive bowel sounds, mildly tender right upper quadrant/epigastric region to palpation.  No significant lower extremity edema.  Imaging: CT Abdomen Pelvis W Contrast  Result Date: 07/17/2020 CLINICAL DATA:  Abdominal pain and vomiting EXAM: CT ABDOMEN AND PELVIS WITH CONTRAST TECHNIQUE: Multidetector CT imaging of the abdomen and pelvis was performed using the standard protocol following bolus administration of intravenous contrast. CONTRAST:  163mL OMNIPAQUE IOHEXOL 300 MG/ML  SOLN COMPARISON:  April 06, 2020 FINDINGS: Lower chest: The visualized heart size within normal limits. No pericardial fluid/thickening. No hiatal hernia. The visualized portions of the lungs are clear. Hepatobiliary: There is diffuse low density seen throughout the liver parenchyma with mild hepatomegaly.The main portal vein is patent. The gallbladder is mildly fluid-filled and distended. No intrahepatic biliary ductal dilatation is noted. Pancreas: Unremarkable. No pancreatic ductal dilatation or surrounding inflammatory changes. Spleen: Normal in size without focal abnormality. Adrenals/Urinary Tract: Both adrenal glands appear normal. Within the lower pole of the right kidney again noted is a 2.1 cm fat containing lesion, likely angiomyolipoma. There is a 3 mm calculus in the lower pole of the right kidney. No hydronephrosis. The bladder is partially decompressed. Stomach/Bowel: The stomach, small bowel, and colon are normal in appearance. No inflammatory changes,  wall thickening, or obstructive findings.The appendix is normal. Vascular/Lymphatic: There are no enlarged mesenteric, retroperitoneal, or pelvic lymph nodes. Scattered mild aortic atherosclerosis is seen. Reproductive: Multiple heterogeneously enhancing uterine fibroids are again identified with the large uterus. Some of which appear to be partially calcified. Other: No evidence of abdominal wall mass or hernia. Musculoskeletal: No acute or significant osseous findings. IMPRESSION: Hepatic steatosis. Mildly distended fluid-filled gallbladder, however no definite evidence of surrounding inflammatory changes. Stable 2 cm right-sided renal angiomyolipoma Nonobstructing right renal calculus. Enlarged uterus with multiple uterine fibroids. Aortic Atherosclerosis (ICD10-I70.0). Electronically Signed   By: Prudencio Pair M.D.   On: 07/17/2020 22:51   MR 3D Recon At Scanner  Result Date: 07/21/2020 CLINICAL DATA:  Right upper quadrant pain, nondiagnostic ultrasound, jaundice, concern for obstruction EXAM: MRI ABDOMEN WITHOUT AND WITH CONTRAST (INCLUDING MRCP) TECHNIQUE: Multiplanar multisequence MR imaging of the abdomen was performed both before and after the administration of intravenous contrast. Heavily T2-weighted images of the biliary and pancreatic ducts were obtained, and three-dimensional MRCP images were rendered by post processing. CONTRAST:  16mL GADAVIST GADOBUTROL 1 MMOL/ML IV SOLN COMPARISON:  None. FINDINGS: Lower chest: No acute findings. Hepatobiliary: Hepatomegaly, maximum coronal span 21.5 cm. Hepatic steatosis. No mass or other parenchymal abnormality identified. No biliary ductal dilatation. Minimal layering sludge in the gallbladder (series 9, image 35). No evidence of discrete calculi or other obstructing lesion to the ampulla. Pancreas: No  mass, inflammatory changes, or other parenchymal abnormality identified. No pancreatic ductal dilatation. Spleen:  Within normal limits in size and appearance.  Adrenals/Urinary Tract: No masses identified. Hemorrhagic or proteinaceous cyst of the inferior pole of the right kidney. No evidence of hydronephrosis. Stomach/Bowel: Visualized portions within the abdomen are unremarkable. Vascular/Lymphatic: No pathologically enlarged lymph nodes identified. No abdominal aortic aneurysm demonstrated. 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 Other:  None. Musculoskeletal: No suspicious bone lesions identified. IMPRESSION: 1. No acute findings to explain pain. Minimal layering sludge in the gallbladder without evidence of discrete gallstones or other obstructing lesions. No biliary ductal dilatation. 2. Hepatomegaly and hepatic steatosis. Electronically Signed   By: Eddie Candle M.D.   On: 07/21/2020 15:36   MR ABDOMEN MRCP W WO CONTAST  Result Date: 07/21/2020 CLINICAL DATA:  Right upper quadrant pain, nondiagnostic ultrasound, jaundice, concern for obstruction EXAM: MRI ABDOMEN WITHOUT AND WITH CONTRAST (INCLUDING MRCP) TECHNIQUE: Multiplanar multisequence MR imaging of the abdomen was performed both before and after the administration of intravenous contrast. Heavily T2-weighted images of the biliary and pancreatic ducts were obtained, and three-dimensional MRCP images were rendered by post processing. CONTRAST:  45mL GADAVIST GADOBUTROL 1 MMOL/ML IV SOLN COMPARISON:  None. FINDINGS: Lower chest: No acute findings. Hepatobiliary: Hepatomegaly, maximum coronal span 21.5 cm. Hepatic steatosis. No mass or other parenchymal abnormality identified. No biliary ductal dilatation. Minimal layering sludge in the gallbladder (series 9, image 35). No evidence of discrete calculi or other obstructing lesion to the ampulla. Pancreas: No mass, inflammatory changes, or other parenchymal abnormality identified. No pancreatic ductal dilatation. Spleen:  Within normal limits in size and appearance. Adrenals/Urinary Tract: No masses identified. Hemorrhagic or proteinaceous  cyst of the inferior pole of the right kidney. No evidence of hydronephrosis. Stomach/Bowel: Visualized portions within the abdomen are unremarkable. Vascular/Lymphatic: No pathologically enlarged lymph nodes identified. No abdominal aortic aneurysm demonstrated. 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 Other:  None. Musculoskeletal: No suspicious bone lesions identified. IMPRESSION: 1. No acute findings to explain pain. Minimal layering sludge in the gallbladder without evidence of discrete gallstones or other obstructing lesions. No biliary ductal dilatation. 2. Hepatomegaly and hepatic steatosis. Electronically Signed   By: Eddie Candle M.D.   On: 07/21/2020 15:36   US Abdomen Limited RUQ (LIVER/GB)  Result Date: 07/18/2020 CLINICAL DATA:  Abdominal pain increased LFTs EXAM: ULTRASOUND ABDOMEN LIMITED RIGHT UPPER QUADRANT COMPARISON:  CT prior day FINDINGS: Gallbladder: Layering slightly hyperdense sludge/small stones are seen. No sonographic Murphy sign noted by sonographer. Common bile duct: Diameter: 2 mm Liver: Increased echotexture seen throughout with hepatomegaly. No focal abnormality or biliary ductal dilatation. Portal vein is patent on color Doppler imaging with normal direction of blood flow towards the liver. Other: None. IMPRESSION: Layering gallbladder sludge/stones. No definite evidence of acute cholecystitis. Hepatic steatosis and mild hepatomegaly Electronically Signed   By: Prudencio Pair M.D.   On: 07/18/2020 02:45    Labs:  CBC: Recent Labs    07/19/20 0511 07/20/20 0629 07/21/20 0418 07/22/20 0430  WBC 6.2 6.5 5.3 3.8*  HGB 9.0* 9.8* 8.8* 8.1*  HCT 27.6* 29.5* 27.3* 24.8*  PLT 147* 160 164 170    COAGS: Recent Labs    07/18/20 0132 07/18/20 0500 07/22/20 1052  INR 1.3* 1.3* 1.2    BMP: Recent Labs    04/06/20 1428 04/07/20 0601 04/07/20 1549 07/19/20 0511 07/20/20 0439 07/21/20 0418 07/22/20 0430  NA 137 136   < > 135 131* 134* 136  K 2.7* 3.0*   < > 3.4* 3.2* 3.6 2.9*  CL 101 107   < > 98 99 102 105  CO2 18* 17*   < > 23 20* 23 19*  GLUCOSE 98 120*   < > 70 99 76 100*  BUN 19 11   < > 10 7 <5* <5*  CALCIUM 8.6* 7.8*   < > 7.2* 7.2* 7.3* 7.3*  CREATININE 0.77 0.80   < > 0.63 0.47 0.36* 0.46  GFRNONAA >60 >60   < > >60 >60 >60 >60  GFRAA >60 >60  --   --   --   --   --    < > = values in this interval not displayed.    LIVER FUNCTION TESTS: Recent Labs    07/19/20 0511 07/20/20 0439 07/21/20 0418 07/22/20 0430  BILITOT 8.3* 10.7* 11.2*  11.2* 11.1*  AST 165* 160* 139*  137* 115*  ALT 64* 63* 61*  59* 52*  ALKPHOS 85 94 90  87 86  PROT 5.1* 5.3* 5.0*  5.0* 4.6*  ALBUMIN 2.4* 2.4* 2.3*  2.3* 2.1*    TUMOR MARKERS: No results for input(s): AFPTM, CEA, CA199, CHROMGRNA in the last 8760 hours.  Assessment and Plan: 59 y.o. female with past medical history of hypertension, hypothyroidism, uterine fibroids, hyperlipidemia and depression who was admitted to [facility] on 12/9 with abdominal pain, nausea, vomiting, jaundice, weakness and weight loss.  Laboratory evaluation revealed hypokalemia, elevated LFTs with total bilirubin currently 11.1, negative acute hepatitis panel, negative ANA/AMA and ASMA, no alpha 1 antitrypsin deficiency.  Patient did have elevated iron saturation, iron, ferritin and decreased TIBC.  Hemochromatosis DNA pending.  MRI of the abdomen revealed no acute findings.  There was minimal layering sludge in the gallbladder without evidence of discrete gallstones or obstructing lesions, no biliary ductal dilatation.  Hepatomegaly and hepatic steatosis were noted.  Also present was stable 2 cm right-sided renal angiomyolipoma, nonobstructing right renal calculus, uterine fibroids and mildly distended fluid-filled gallbladder with no definite evidence of surrounding inflammatory changes.  Request now received from GI service for image guided random core liver biopsy.  Patient is COVID-19  negative.Risks and benefits of procedure was discussed with the patient  including, but not limited to bleeding, infection, damage to adjacent structures or low yield requiring additional tests.  All of the questions were answered and there is agreement to proceed.  Consent signed and in chart.  Procedure tentatively planned for later this afternoon.   Thank you for this interesting consult.  I greatly enjoyed meeting Uriah A Gertz and look forward to participating in their care.  A copy of this report was sent to the requesting provider on this date.  Electronically Signed: D. Rowe Robert, PA-C 07/22/2020, 12:03 PM   I spent a total of  25 minutes   in face to face in clinical consultation, greater than 50% of which was counseling/coordinating care for image guided random core liver biopsy

## 2020-07-22 NOTE — Plan of Care (Signed)
  Problem: Education: Goal: Knowledge of General Education information will improve Description: Including pain rating scale, medication(s)/side effects and non-pharmacologic comfort measures Outcome: Progressing   Problem: Clinical Measurements: Goal: Ability to maintain clinical measurements within normal limits will improve Outcome: Progressing   Problem: Clinical Measurements: Goal: Will remain free from infection Outcome: Progressing   Problem: Clinical Measurements: Goal: Diagnostic test results will improve Outcome: Progressing   Problem: Safety: Goal: Ability to remain free from injury will improve Outcome: Progressing   Problem: Skin Integrity: Goal: Risk for impaired skin integrity will decrease Outcome: Progressing

## 2020-07-22 NOTE — Progress Notes (Signed)
Physical Therapy Treatment Patient Details Name: Judy Meyer MRN: [id number] DOB: [birth year] Today's Date: 07/22/2020    History of Present Illness 59 y.o. female with medical history significant of fibroids, HTN, HLD, depression and admitted for nausea and abdominal pain, found to have Hyperbilirubinemia/ elevated LFT's    PT Comments    Pt performed exercises in sitting and then ambulated in hallway.  Pt reports nausea and dizziness have improved however endorses weakness today.  Pt does require min assist for stability during mobilization today.    Follow Up Recommendations  Home health PT;Supervision for mobility/OOB     Equipment Recommendations  Rolling walker with 5" wheels    Recommendations for Other Services       Precautions / Restrictions Precautions Precautions: Fall    Mobility  Bed Mobility Overal bed mobility: Modified Independent                Transfers Overall transfer level: Needs assistance Equipment used: Rolling walker (2 wheeled) Transfers: Sit to/from Stand Sit to Stand: Min assist         General transfer comment: assist to rise and steady  Ambulation/Gait Ambulation/Gait assistance: Min assist Gait Distance (Feet): 50 Feet Assistive device: Rolling walker (2 wheeled) Gait Pattern/deviations: Step-through pattern;Decreased stride length     General Gait Details: assist for steadying, pt reports being unsteady due to weakness, denies any dizziness today   Stairs             Wheelchair Mobility    Modified Rankin (Stroke Patients Only)       Balance           Standing balance support: Bilateral upper extremity supported Standing balance-Leahy Scale: Poor                              Cognition Arousal/Alertness: Awake/alert Behavior During Therapy: WFL for tasks assessed/performed Overall Cognitive Status: Within Functional Limits for tasks assessed                                         Exercises General Exercises - Lower Extremity Ankle Circles/Pumps: AROM;Both;10 reps;Seated Long Arc Quad: AROM;Both;10 reps;Seated Hip Flexion/Marching: AROM;Seated;10 reps;Both Mini-Sqauts: AROM;Both;10 reps;Standing    General Comments        Pertinent Vitals/Pain Pain Assessment: No/denies pain    Home Living                      Prior Function            PT Goals (current goals can now be found in the care plan section) Progress towards PT goals: Progressing toward goals    Frequency    Min 3X/week      PT Plan Current plan remains appropriate    Co-evaluation              AM-PAC PT "6 Clicks" Mobility   Outcome Measure  Help needed turning from your back to your side while in a flat bed without using bedrails?: A Little Help needed moving from lying on your back to sitting on the side of a flat bed without using bedrails?: A Little Help needed moving to and from a bed to a chair (including a wheelchair)?: A Little Help needed standing up from a chair using your arms (e.g., wheelchair or bedside chair)?: A  Little Help needed to walk in hospital room?: A Little Help needed climbing 3-5 steps with a railing? : A Little 6 Click Score: 18    End of Session Equipment Utilized During Treatment: Gait belt Activity Tolerance: Patient tolerated treatment well Patient left: in bed;with call bell/phone within reach   PT Visit Diagnosis: Difficulty in walking, not elsewhere classified (R26.2);Muscle weakness (generalized) (M62.81)     Time: 4465-2076 PT Time Calculation (min) (ACUTE ONLY): 14 min  Charges:  $Gait Training: 8-22 mins                     Arlyce Dice, DPT Acute Rehabilitation Services Pager: [phone] Office: [phone]  York Ram E 07/22/2020, 12:50 PM

## 2020-07-22 NOTE — Procedures (Deleted)
Interventional Radiology Procedure Note  Procedure: CT guided aspirate and core biopsy of right posterior iliac bone Complications: None Recommendations: - Bedrest supine x 1 hrs - OTC's PRN  Pain - Follow biopsy results  Signed,  Cheyrl Buley S. Mark Benecke, DO    

## 2020-07-22 NOTE — Plan of Care (Signed)
  Problem: Pain Managment: Goal: General experience of comfort will improve Outcome: Progressing   

## 2020-07-22 NOTE — Progress Notes (Signed)
[facility] Progress Note  BRITTNE KAWASAKI 59 y.o. [birth year]  CC:  Transaminitis, RUQ abdominal pain  Subjective: Patient reports continued RUQ abdominal pain.  Has nausea but denies vomiting.  Continues to have constipation.  ROS : Review of Systems  Cardiovascular: Negative for chest pain and palpitations.  Gastrointestinal: Positive for abdominal pain, constipation and nausea. Negative for blood in stool, diarrhea, heartburn, melena and vomiting.    Objective: Vital signs in last 24 hours: Vitals:   07/21/20 2139 07/22/20 0537  BP: 101/62 128/73  Pulse: 76 73  Resp: 18 20  Temp: 98 F (36.7 C) 98 F (36.7 C)  SpO2: 98% 100%    Physical Exam:  General:  Lethargic, oriented, cooperative, no distress  Head:  Normocephalic, without obvious abnormality, atraumatic  Eyes:  Deep icterus, EOM's intact,   Lungs:   Clear to auscultation bilaterally, respirations unlabored  Heart:  Regular rate and rhythm, S1, S2 normal  Abdomen:   Soft with moderate RUQ tenderness to palpation, bowel sounds active all four quadrants,  no peritoneal signs  Extremities: Extremities normal, atraumatic, no  edema  Pulses: 2+ and symmetric    Lab Results: Recent Labs    07/21/20 0418 07/22/20 0430  NA 134* 136  K 3.6 2.9*  CL 102 105  CO2 23 19*  GLUCOSE 76 100*  BUN <5* <5*  CREATININE 0.36* 0.46  CALCIUM 7.3* 7.3*  MG 1.8 1.6*   Recent Labs    07/21/20 0418 07/22/20 0430  AST 139*   137* 115*  ALT 61*   59* 52*  ALKPHOS 90   87 86  BILITOT 11.2*   11.2* 11.1*  PROT 5.0*   5.0* 4.6*  ALBUMIN 2.3*   2.3* 2.1*   Recent Labs    07/21/20 0418 07/22/20 0430  WBC 5.3 3.8*  NEUTROABS 3.5 2.3  HGB 8.8* 8.1*  HCT 27.3* 24.8*  MCV 124.1* 124.6*  PLT 164 170   No results for input(s): LABPROT, INR in the last 72 hours.    Assessment: Abnormal LFTs: possible rhabdomyolysis vs. hemochromatosis. Alcoholic hepatitis less likely, though remains on the differnetial.    -T. Bili 11.1/ AST 115/ ALT 52/ ALP 86 -PT 15.7/ INR 1.3 as of 12/10 -Elevated iron saturation (88%), iron (191), decreased TIBC (216), and elevated ferritin (802).  Hemochromastosis DNA pending. -Mildly low ceruloplasmin (17.2) -Acute hepatitis panel negative -ANA, AMA, and ASMA negative -No alpha 1 antitrypsin deficiency -MRCP 07/21/20: Hepatomegaly and hepatic steatosis  RUQ Abdominal pain: not consistent with acute cholecystitis. Mildly distended fluid-filled gallbladder, no biliary dilation per CT 12/9.  RUQ [place] 12/10: Layering gallbladder sludge/stones. No definite evidence of acute Cholecystitis. Normal CBD (70mm). -MRCP 07/21/20: No acute findings to explain pain. Minimal layering sludge in the gallbladder without evidence of discrete gallstones or other obstructing lesions. No biliary ductal dilatation.  Constipation, Miralax qd initiated  Plan: IR consultation for liver biopsy.  Discussed with patient, who is amenable.  Pt made NPO at 09:30 in case of liver biopsy (was on clears prior). If liver bx not done today, can re-initiate clears with NPO after midnight.  Continue to trend LFTs.  Continue Miralax daily.  [facility] will follow.   Salley Slaughter PA-C 07/22/2020, 10:09 AM  Contact #  [phone]

## 2020-07-23 LAB — BASIC METABOLIC PANEL
Anion gap: 10 (ref 5–15)
BUN: <5 mg/dL — ABNORMAL LOW (ref 6–20)
CO2: 19 mmol/L — ABNORMAL LOW (ref 22–32)
Calcium: 7.5 mg/dL — ABNORMAL LOW (ref 8.9–10.3)
Chloride: 105 mmol/L (ref 98–111)
Creatinine, Ser: <0.3 mg/dL — ABNORMAL LOW (ref 0.44–1.00)
Glucose, Bld: 83 mg/dL (ref 70–99)
Potassium: 3.8 mmol/L (ref 3.5–5.1)
Sodium: 134 mmol/L — ABNORMAL LOW (ref 135–145)

## 2020-07-23 LAB — CBC WITH DIFFERENTIAL/PLATELET
Abs Immature Granulocytes: 0.06 10*3/uL (ref 0.00–0.07)
Basophils Absolute: 0 10*3/uL (ref 0.0–0.1)
Basophils Relative: 0 %
Eosinophils Absolute: 0 10*3/uL (ref 0.0–0.5)
Eosinophils Relative: 0 %
HCT: 26.2 % — ABNORMAL LOW (ref 36.0–46.0)
Hemoglobin: 8.4 g/dL — ABNORMAL LOW (ref 12.0–15.0)
Immature Granulocytes: 2 %
Lymphocytes Relative: 25 %
Lymphs Abs: 0.9 10*3/uL (ref 0.7–4.0)
MCH: 41 pg — ABNORMAL HIGH (ref 26.0–34.0)
MCHC: 32.1 g/dL (ref 30.0–36.0)
MCV: 127.8 fL — ABNORMAL HIGH (ref 80.0–100.0)
Monocytes Absolute: 0.5 10*3/uL (ref 0.1–1.0)
Monocytes Relative: 14 %
Neutro Abs: 2.3 10*3/uL (ref 1.7–7.7)
Neutrophils Relative %: 59 %
Platelets: 169 10*3/uL (ref 150–400)
RBC: 2.05 MIL/uL — ABNORMAL LOW (ref 3.87–5.11)
RDW: 18.5 % — ABNORMAL HIGH (ref 11.5–15.5)
WBC: 3.8 10*3/uL — ABNORMAL LOW (ref 4.0–10.5)
nRBC: 0.5 % — ABNORMAL HIGH (ref 0.0–0.2)

## 2020-07-23 LAB — HEPATIC FUNCTION PANEL
ALT: 54 U/L — ABNORMAL HIGH (ref 0–44)
AST: 122 U/L — ABNORMAL HIGH (ref 15–41)
Albumin: 2.2 g/dL — ABNORMAL LOW (ref 3.5–5.0)
Alkaline Phosphatase: 91 U/L (ref 38–126)
Bilirubin, Direct: 7.8 mg/dL — ABNORMAL HIGH (ref 0.0–0.2)
Indirect Bilirubin: 5 mg/dL — ABNORMAL HIGH (ref 0.3–0.9)
Total Bilirubin: 12.8 mg/dL — ABNORMAL HIGH (ref 0.3–1.2)
Total Protein: 5 g/dL — ABNORMAL LOW (ref 6.5–8.1)

## 2020-07-23 LAB — MAGNESIUM: Magnesium: 1.8 mg/dL (ref 1.7–2.4)

## 2020-07-23 LAB — CK: Total CK: 1881 U/L — ABNORMAL HIGH (ref 38–234)

## 2020-07-23 MED ORDER — TRAMADOL HCL 50 MG PO TABS
50.0000 mg | ORAL_TABLET | Freq: Four times a day (QID) | ORAL | Status: DC | PRN
  Administered 2020-07-23 – 2020-07-24 (×2): 50 mg via ORAL
  Filled 2020-07-23 (×2): qty 1

## 2020-07-23 NOTE — Plan of Care (Signed)
  Problem: Education: Goal: Knowledge of General Education information will improve Description: Including pain rating scale, medication(s)/side effects and non-pharmacologic comfort measures Outcome: Progressing   Problem: Clinical Measurements: Goal: Ability to maintain clinical measurements within normal limits will improve Outcome: Progressing Goal: Will remain free from infection Outcome: Progressing Goal: Diagnostic test results will improve Outcome: Progressing   Problem: Activity: Goal: Risk for activity intolerance will decrease Outcome: Progressing   Problem: Pain Managment: Goal: General experience of comfort will improve Outcome: Progressing   Problem: Safety: Goal: Ability to remain free from injury will improve Outcome: Progressing   Problem: Skin Integrity: Goal: Risk for impaired skin integrity will decrease Outcome: Progressing   

## 2020-07-23 NOTE — Progress Notes (Signed)
Pt complaining of skin tightness of BLE, noted increased swelling of BLE 3+ edema, notified MD on call, no new orders and ted hose are on.

## 2020-07-23 NOTE — Progress Notes (Signed)
Patient ID: Judy Meyer, female   DOB: 06/07/1961, 59 y.o.   MRN: [id number]  PROGRESS NOTE    ADELLE ZACHAR  IRJ:[id number] DOB: 10-Sep-1960 DOA: 07/17/2020 PCP: Pcp, No   Brief Narrative:  59  yo female with PMH hypothyroidism, hypertension, uterine fibroids, hyperlipidemia, depression who presented to the ER with worsening nausea and vomiting with jaundice, decreased strength, weight loss. On work-up she was found to have multiple lab abnormalities.  Also underwent ultrasound abdomen which showed gallbladder stones and sludge. LFTs were elevated and she was also found to have iron excess.  GI was consulted on admission as well.   Assessment & Plan:   Abdominal pain Abnormal LFTs -Ultrasound showed cholelithiasis and sludge but no evidence of acute cholecystitis. -Unable to do HIDA because of total bilirubin being more than 4.5 as per radiology -MRCP was negative for any acute findings without evidence of gallstones, obstruction lesions or biliary dilatation -GI following.  Status post liver biopsy by IR on 07/22/2020: Pathology pending -Still has abdominal pain requiring IV morphine. -Total bilirubin still trending upwards, 12.8 today  Rhabdomyolysis -CK 5731 on admission with normal renal function.  Currently on IV fluids.  CK total improving to 1881 today.  DC IV fluids because of worsening lower extremity swelling.  Iron excess - elevated iron, sat ratio, and ferritin - unclear etiology; no chronic transfusions - follow up GI workup; testing of hemochromatosis and other etiologies at this time  Hypothyroidism -Continue levothyroxine  Acute lower UTI -Culture negative.  DC'd Rocephin on 07/22/2020.  Moderate malnutrition -Follow nutrition recommendations  Macrocytic anemia -B12 normal.  Folate low.  Continue folic acid supplementation  Hypertension -Continue amlodipine  Hyperlipidemia -Statin on hold because of abnormal  LFTs  Hypokalemia -Improved  Hypomagnesemia -Improved  DVT prophylaxis: SCDs Code Status: Full Family Communication: None at bedside Disposition Plan: Status is: Inpatient  Remains inpatient appropriate because:Inpatient level of care appropriate due to severity of illness   Dispo: The patient is from: Home              Anticipated d/c is to: Home              Anticipated d/c date is: 2 days              Patient currently is not medically stable to d/c.   Consultants: GI/IR  Procedures: Liver biopsy by IR on 07/22/2020  Antimicrobials: Rocephin from 07/18/2020-07/22/2020   Subjective: Patient seen and examined at bedside.  States that her lower extremities are swollen.  Still complains of intermittent abdominal pain with some nausea.  No overnight fever or worsening shortness of breath reported. Objective: Vitals:   07/22/20 1625 07/22/20 1630 07/22/20 2109 07/23/20 0418  BP: 128/79 121/68 121/72 136/80  Pulse: 82 81 80 72  Resp: 12 12 20 14   Temp:   98.4 F (36.9 C) 97.9 F (36.6 C)  TempSrc:   Oral Oral  SpO2: 100% 100% 99% 100%  Weight:      Height:        Intake/Output Summary (Last 24 hours) at 07/23/2020 0750 Last data filed at 07/23/2020 0600 Gross per 24 hour  Intake 1037.98 ml  Output --  Net 1037.98 ml   Filed Weights   07/17/20 2016  Weight: 63.5 kg    Examination:  General exam: No acute distress.  Looks chronically ill.  Icterus present. Respiratory system: Decreased breath sounds at bases bilaterally with scattered crackles, no wheezing  cardiovascular system: Rate controlled,  S1-S2 heard Gastrointestinal system: Abdomen is slightly distended, soft and mildly tender in the right upper quadrant.  Bowel sounds are heard Extremities: Bilateral lower extremity edema present.  No clubbing Central nervous system: Awake and alert.  No focal neurological deficits.  Moves extremities  skin: No rash lesions/ecchymosis Psychiatry: Affect is  flat    Data Reviewed: I have personally reviewed following labs and imaging studies  CBC: Recent Labs  Lab 07/19/20 0511 07/20/20 0629 07/21/20 0418 07/22/20 0430 07/23/20 0504  WBC 6.2 6.5 5.3 3.8* 3.8*  NEUTROABS 4.1 4.0 3.5 2.3 2.3  HGB 9.0* 9.8* 8.8* 8.1* 8.4*  HCT 27.6* 29.5* 27.3* 24.8* 26.2*  MCV 122.7* 122.4* 124.1* 124.6* 127.8*  PLT 147* 160 164 170 237   Basic Metabolic Panel: Recent Labs  Lab 07/18/20 0132 07/18/20 0500 07/19/20 0511 07/20/20 0439 07/21/20 0418 07/22/20 0430 07/23/20 0504  NA  --  137 135 131* 134* 136 134*  K  --  3.2* 3.4* 3.2* 3.6 2.9* 3.8  CL  --  98 98 99 102 105 105  CO2  --  23 23 20* 23 19* 19*  GLUCOSE  --  101* 70 99 76 100* 83  BUN  --  11 10 7  <5* <5* <5*  CREATININE  --  0.69 0.63 0.47 0.36* 0.46 <0.30*  CALCIUM  --  7.6* 7.2* 7.2* 7.3* 7.3* 7.5*  MG 1.5* 1.5* 2.3 1.9 1.8 1.6* 1.8  PHOS 2.0* 2.0*  --   --   --   --   --    GFR: CrCl cannot be calculated (This lab value cannot be used to calculate CrCl because it is not a number: <0.30). Liver Function Tests: Recent Labs  Lab 07/19/20 0511 07/20/20 0439 07/21/20 0418 07/22/20 0430 07/23/20 0504  AST 165* 160* 139*  137* 115* 122*  ALT 64* 63* 61*  59* 52* 54*  ALKPHOS 85 94 90  87 86 91  BILITOT 8.3* 10.7* 11.2*  11.2* 11.1* 12.8*  PROT 5.1* 5.3* 5.0*  5.0* 4.6* 5.0*  ALBUMIN 2.4* 2.4* 2.3*  2.3* 2.1* 2.2*   Recent Labs  Lab 07/17/20 2021 07/19/20 0603 07/21/20 0418  LIPASE 53* 35 37   Recent Labs  Lab 07/18/20 0500  AMMONIA 44*   Coagulation Profile: Recent Labs  Lab 07/18/20 0132 07/18/20 0500 07/22/20 1052  INR 1.3* 1.3* 1.2   Cardiac Enzymes: Recent Labs  Lab 07/18/20 0132 07/20/20 0629 07/21/20 0418 07/22/20 0430 07/23/20 0504  CKTOTAL 5,734* 3,532* 3,102* 2,198* 1,881*   BNP (last 3 results) No results for input(s): PROBNP in the last 8760 hours. HbA1C: No results for input(s): HGBA1C in the last 72 hours. CBG: Recent  Labs  Lab 07/22/20 2131  GLUCAP 94   Lipid Profile: No results for input(s): CHOL, HDL, LDLCALC, TRIG, CHOLHDL, LDLDIRECT in the last 72 hours. Thyroid Function Tests: No results for input(s): TSH, T4TOTAL, FREET4, T3FREE, THYROIDAB in the last 72 hours. Anemia Panel: No results for input(s): VITAMINB12, FOLATE, FERRITIN, TIBC, IRON, RETICCTPCT in the last 72 hours. Sepsis Labs: Recent Labs  Lab 07/18/20 0139 07/18/20 0747 07/18/20 1052  LATICACIDVEN 4.2* 2.6* 2.5*    Recent Results (from the past 240 hour(s))  Culture, Urine     Status: None   Collection Time: 07/17/20  9:00 PM   Specimen: Urine, Random  Result Value Ref Range Status   Specimen Description   Final    URINE, RANDOM Performed at [facility] [address]  [place], [place] [address]    Special Requests   Final    NONE Performed at [facility], [place] [address], [place], [place] [address]    Culture   Final    NO GROWTH Performed at [facility], [place] [address], [place], [place] [address]    Report Status 07/19/2020 FINAL  Final  Resp Panel by RT-PCR (Flu A&B, Covid) Nasopharyngeal Swab     Status: None   Collection Time: 07/18/20 12:36 AM   Specimen: Nasopharyngeal Swab; Nasopharyngeal(NP) swabs in vial transport medium  Result Value Ref Range Status   SARS Coronavirus 2 by RT PCR NEGATIVE NEGATIVE Final    Comment: (NOTE) SARS-CoV-2 target nucleic acids are NOT DETECTED.  The SARS-CoV-2 RNA is generally detectable in upper respiratory specimens during the acute phase of infection. The lowest concentration of SARS-CoV-2 viral copies this assay can detect is 138 copies/mL. A negative result does not preclude SARS-Cov-2 infection and should not be used as the sole basis for treatment or other patient management decisions. A negative result may occur with  improper specimen collection/handling, submission of specimen other than nasopharyngeal swab, presence  of viral mutation(s) within the areas targeted by this assay, and inadequate number of viral copies(<138 copies/mL). A negative result must be combined with clinical observations, patient history, and epidemiological information. The expected result is Negative.  Fact Sheet for Patients:  EntrepreneurPulse.com.au  Fact Sheet for Healthcare Providers:  IncredibleEmployment.be  This test is no t yet approved or cleared by the [place] FDA and  has been authorized for detection and/or diagnosis of SARS-CoV-2 by FDA under an Emergency Use Authorization (EUA). This EUA will remain  in effect (meaning this test can be used) for the duration of the COVID-19 declaration under Section 564(b)(1) of the Act, 21 U.S.C.section 360bbb-3(b)(1), unless the authorization is terminated  or revoked sooner.       Influenza A by PCR NEGATIVE NEGATIVE Final   Influenza B by PCR NEGATIVE NEGATIVE Final    Comment: (NOTE) The Xpert Xpress SARS-CoV-2/FLU/RSV plus assay is intended as an aid in the diagnosis of influenza from Nasopharyngeal swab specimens and should not be used as a sole basis for treatment. Nasal washings and aspirates are unacceptable for Xpert Xpress SARS-CoV-2/FLU/RSV testing.  Fact Sheet for Patients: EntrepreneurPulse.com.au  Fact Sheet for Healthcare Providers: IncredibleEmployment.be  This test is not yet approved or cleared by the [place] FDA and has been authorized for detection and/or diagnosis of SARS-CoV-2 by FDA under an Emergency Use Authorization (EUA). This EUA will remain in effect (meaning this test can be used) for the duration of the COVID-19 declaration under Section 564(b)(1) of the Act, 21 U.S.C. section 360bbb-3(b)(1), unless the authorization is terminated or revoked.  Performed at [facility], [place] [address], [place], [place] [address]           Radiology Studies: MR 3D Recon At Scanner  Result Date: 07/21/2020 CLINICAL DATA:  Right upper quadrant pain, nondiagnostic ultrasound, jaundice, concern for obstruction EXAM: MRI ABDOMEN WITHOUT AND WITH CONTRAST (INCLUDING MRCP) TECHNIQUE: Multiplanar multisequence MR imaging of the abdomen was performed both before and after the administration of intravenous contrast. Heavily T2-weighted images of the biliary and pancreatic ducts were obtained, and three-dimensional MRCP images were rendered by post processing. CONTRAST:  21mL GADAVIST GADOBUTROL 1 MMOL/ML IV SOLN COMPARISON:  None. FINDINGS: Lower chest: No acute findings. Hepatobiliary: Hepatomegaly, maximum coronal span 21.5 cm. Hepatic steatosis. No mass or other parenchymal  abnormality identified. No biliary ductal dilatation. Minimal layering sludge in the gallbladder (series 9, image 35). No evidence of discrete calculi or other obstructing lesion to the ampulla. Pancreas: No mass, inflammatory changes, or other parenchymal abnormality identified. No pancreatic ductal dilatation. Spleen:  Within normal limits in size and appearance. Adrenals/Urinary Tract: No masses identified. Hemorrhagic or proteinaceous cyst of the inferior pole of the right kidney. No evidence of hydronephrosis. Stomach/Bowel: Visualized portions within the abdomen are unremarkable. Vascular/Lymphatic: No pathologically enlarged lymph nodes identified. No abdominal aortic aneurysm demonstrated. 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 Other:  None. Musculoskeletal: No suspicious bone lesions identified. IMPRESSION: 1. No acute findings to explain pain. Minimal layering sludge in the gallbladder without evidence of discrete gallstones or other obstructing lesions. No biliary ductal dilatation. 2. Hepatomegaly and hepatic steatosis. Electronically Signed   By: Eddie Candle M.D.   On: 07/21/2020 15:36   US BIOPSY (LIVER)  Result Date:  07/22/2020 INDICATION: 59 year old female with a history of liver failure EXAM: ULTRASOUND-GUIDED MEDICAL LIVER BIOPSY MEDICATIONS: None. ANESTHESIA/SEDATION: Moderate (conscious) sedation was employed during this procedure. A total of Versed 2.0 mg and Fentanyl 100 mcg was administered intravenously. Moderate Sedation Time: 10 minutes. The patient's level of consciousness and vital signs were monitored continuously by radiology nursing throughout the procedure under my direct supervision. FLUOROSCOPY TIME:  Ultrasound COMPLICATIONS: None PROCEDURE: Informed written consent was obtained from the patient after a thorough discussion of the procedural risks, benefits and alternatives. All questions were addressed. Maximal Sterile Barrier Technique was utilized including caps, mask, sterile gowns, sterile gloves, sterile drape, hand hygiene and skin antiseptic. A timeout was performed prior to the initiation of the procedure. Ultrasound survey of the right liver lobe performed with images stored and sent to PACs. The right lower thorax/right upper abdomen was prepped with chlorhexidine in a sterile fashion, and a sterile drape was applied covering the operative field. A sterile gown and sterile gloves were used for the procedure. Local anesthesia was provided with 1% Lidocaine. The patient was prepped and draped sterilely and the skin and subcutaneous tissues were generously infiltrated with 1% lidocaine. A 17 gauge introducer needle was then advanced under ultrasound guidance in an intercostal location into the right liver lobe. The stylet was removed, and multiple separate 18 gauge core biopsy were retrieved. Samples were placed into fresh specimen/saline for transportation to the lab. Gel-Foam pledgets were then infused with a small amount of saline for assistance with hemostasis. The needle was removed, and a final ultrasound image was performed. The patient tolerated the procedure well and remained hemodynamically  stable throughout. No complications were encountered and no significant blood loss was encounter. IMPRESSION: Status post ultrasound-guided medical liver biopsy. Signed, Dulcy Fanny. Dellia Nims, RPVI [facility] Electronically Signed   By: Corrie Mckusick D.O.   On: 07/22/2020 16:54   MR ABDOMEN MRCP W WO CONTAST  Result Date: 07/21/2020 CLINICAL DATA:  Right upper quadrant pain, nondiagnostic ultrasound, jaundice, concern for obstruction EXAM: MRI ABDOMEN WITHOUT AND WITH CONTRAST (INCLUDING MRCP) TECHNIQUE: Multiplanar multisequence MR imaging of the abdomen was performed both before and after the administration of intravenous contrast. Heavily T2-weighted images of the biliary and pancreatic ducts were obtained, and three-dimensional MRCP images were rendered by post processing. CONTRAST:  85mL GADAVIST GADOBUTROL 1 MMOL/ML IV SOLN COMPARISON:  None. FINDINGS: Lower chest: No acute findings. Hepatobiliary: Hepatomegaly, maximum coronal span 21.5 cm. Hepatic steatosis. No mass or other parenchymal abnormality identified.  No biliary ductal dilatation. Minimal layering sludge in the gallbladder (series 9, image 35). No evidence of discrete calculi or other obstructing lesion to the ampulla. Pancreas: No mass, inflammatory changes, or other parenchymal abnormality identified. No pancreatic ductal dilatation. Spleen:  Within normal limits in size and appearance. Adrenals/Urinary Tract: No masses identified. Hemorrhagic or proteinaceous cyst of the inferior pole of the right kidney. No evidence of hydronephrosis. Stomach/Bowel: Visualized portions within the abdomen are unremarkable. Vascular/Lymphatic: No pathologically enlarged lymph nodes identified. No abdominal aortic aneurysm demonstrated. 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 Other:  None. Musculoskeletal: No suspicious bone lesions identified. IMPRESSION: 1. No acute findings to  explain pain. Minimal layering sludge in the gallbladder without evidence of discrete gallstones or other obstructing lesions. No biliary ductal dilatation. 2. Hepatomegaly and hepatic steatosis. Electronically Signed   By: Eddie Candle M.D.   On: 07/21/2020 15:36        Scheduled Meds: . amLODipine  5 mg Oral Daily  . feeding supplement  237 mL Oral BID BM  . folic acid  1 mg Oral Daily  . polyethylene glycol  17 g Oral Daily  . thiamine  100 mg Oral Daily  . venlafaxine XR  75 mg Oral Daily   Continuous Infusions: . 0.9 % NaCl with KCl 40 mEq / L 75 mL/hr at 07/22/20 2354  . levothyroxine            Aline August, MD [facility] 07/23/2020, 7:50 AM

## 2020-07-23 NOTE — TOC Progression Note (Signed)
Transition of Care [facility]) - Progression Note    Patient Details  Name: Judy Meyer MRN: [id number] Date of Birth: 08/02/1961  Transition of Care [facility]) CM/SW Contact  Purcell Mouton, RN Phone Number: 07/23/2020, 1:50 PM  Clinical Narrative:    Pt will discharge home with HHPT. Alvis Lemmings was selected for HHPT and [employer] for RW.  Pt's address is [place], [place].    Expected Discharge Plan: [place] Barriers to Discharge: No Barriers Identified  Expected Discharge Plan and Services Expected Discharge Plan: [place] arrangements for the past 2 months: Single Family Home                                       Social Determinants of Health (SDOH) Interventions    Readmission Risk Interventions No flowsheet data found.

## 2020-07-23 NOTE — Progress Notes (Signed)
Occupational Therapy Treatment Patient Details Name: Judy Meyer MRN: [id number] DOB: 1961-05-31 Today's Date: 07/23/2020    History of present illness 59 y.o. female with medical history significant of fibroids, HTN, HLD, depression and admitted for nausea and abdominal pain, found to have Hyperbilirubinemia/ elevated LFT's   OT comments  Patient supervision level for sink side g/h tasks and functional ambulation ~80 ft with rolling walker in hallway. Patient still with decreased activity tolerance and impacted by abdominal pain s/p liver biopsy yesterday. Would benefit from further acute OT services to build endurance and global strengthening in order to safely participate in daily routine.   Follow Up Recommendations  No OT follow up    Equipment Recommendations  Tub/shower seat       Precautions / Restrictions Precautions Precautions: Fall       Mobility Bed Mobility Overal bed mobility: Modified Independent             General bed mobility comments: increased time and effort  Transfers Overall transfer level: Needs assistance Equipment used: Rolling walker (2 wheeled) Transfers: Sit to/from Stand Sit to Stand: Supervision         General transfer comment: patient require increase time but no physical assistance to power up to standing from EOB    Balance Overall balance assessment: Needs assistance Sitting-balance support: Feet supported Sitting balance-Leahy Scale: Fair     Standing balance support: Bilateral upper extremity supported;No upper extremity supported Standing balance-Leahy Scale: Fair Standing balance comment: able to perform g/h tasks without UE support, walker with ambulation                           ADL either performed or assessed with clinical judgement   ADL Overall ADL's : Needs assistance/impaired     Grooming: Oral care;Wash/dry face;Supervision/safety;Standing Grooming Details (indicate cue type and reason):  increased activity tolerance this session, able to stand sink side to perform g/h at supervision level as patient is mildly tremulous                 Toilet Transfer: Supervision/safety;RW;Ambulation Armed forces technical officer Details (indicate cue type and reason): patient  ambulate ~80 ft at supervision level with rolling walker in order to build endurance necessary for daily routine at home, patient reports this is improvement from previous day         Functional mobility during ADLs: Supervision/safety;Rolling walker                 Cognition Arousal/Alertness: Awake/alert Behavior During Therapy: WFL for tasks assessed/performed Overall Cognitive Status: Within Functional Limits for tasks assessed                                                     Pertinent Vitals/ Pain       Pain Assessment: Faces Faces Pain Scale: Hurts even more Pain Location: abdominal Pain Descriptors / Indicators: Sore Pain Intervention(s): Patient requesting pain meds-RN notified         Frequency  Min 2X/week        Progress Toward Goals  OT Goals(current goals can now be found in the care plan section)  Progress towards OT goals: Progressing toward goals  Acute Rehab OT Goals Patient Stated Goal: less pain OT Goal Formulation: With patient Time For Goal Achievement: 08/01/20  Potential to Achieve Goals: Good ADL Goals Pt Will Perform Grooming: with modified independence Pt Will Perform Upper Body Dressing: with modified independence Pt Will Perform Lower Body Dressing: with modified independence Pt Will Transfer to Toilet: with modified independence;ambulating Pt Will Perform Toileting - Clothing Manipulation and hygiene: with modified independence  Plan Discharge plan remains appropriate       AM-PAC OT "6 Clicks" Daily Activity     Outcome Measure   Help from another person eating meals?: None Help from another person taking care of personal grooming?: A  Little Help from another person toileting, which includes using toliet, bedpan, or urinal?: A Little Help from another person bathing (including washing, rinsing, drying)?: A Little Help from another person to put on and taking off regular upper body clothing?: A Little Help from another person to put on and taking off regular lower body clothing?: A Little 6 Click Score: 19    End of Session Equipment Utilized During Treatment: Rolling walker  OT Visit Diagnosis: Other abnormalities of gait and mobility (R26.89)   Activity Tolerance Patient tolerated treatment well   Patient Left in bed;with call bell/phone within reach;with bed alarm set   Nurse Communication Patient requests pain meds        Time: 1205-1221 OT Time Calculation (min): 16 min  Charges: OT General Charges $OT Visit: 1 Visit OT Treatments $Self Care/Home Management : 8-22 mins  Delbert Phenix OT OT pager: [phone]   Rosemary Holms 07/23/2020, 2:13 PM

## 2020-07-23 NOTE — Plan of Care (Signed)
°  Problem: Health Behavior/Discharge Planning: Goal: Ability to manage health-related needs will improve Outcome: Progressing   Problem: Clinical Measurements: Goal: Ability to maintain clinical measurements within normal limits will improve Outcome: Progressing   Problem: Clinical Measurements: Goal: Will remain free from infection Outcome: Progressing   Problem: Clinical Measurements: Goal: Diagnostic test results will improve Outcome: Progressing   Problem: Pain Managment: Goal: General experience of comfort will improve Outcome: Progressing   Problem: Safety: Goal: Ability to remain free from injury will improve Outcome: Progressing

## 2020-07-23 NOTE — Progress Notes (Signed)
[facility] Progress Note  Judy Meyer 59 y.o. 1960/11/07   Subjective: Complaining of worsened RUQ pain since liver biopsy yesterday. No BMs.   Objective: Vital signs: Vitals:   07/22/20 2109 07/23/20 0418  BP: 121/72 136/80  Pulse: 80 72  Resp: 20 14  Temp: 98.4 F (36.9 C) 97.9 F (36.6 C)  SpO2: 99% 100%    Physical Exam: Gen: lethargic, jaundice, no acute distress  HEENT: +icteric sclera CV: RRR Chest: CTA B Abd: upper quadrant tenderness (worse in RUQ) with guarding, soft, nondistended, +BS Ext: no edema  Lab Results: Recent Labs    07/22/20 0430 07/23/20 0504  NA 136 134*  K 2.9* 3.8  CL 105 105  CO2 19* 19*  GLUCOSE 100* 83  BUN <5* <5*  CREATININE 0.46 <0.30*  CALCIUM 7.3* 7.5*  MG 1.6* 1.8   Recent Labs    07/22/20 0430 07/23/20 0504  AST 115* 122*  ALT 52* 54*  ALKPHOS 86 91  BILITOT 11.1* 12.8*  PROT 4.6* 5.0*  ALBUMIN 2.1* 2.2*   Recent Labs    07/22/20 0430 07/23/20 0504  WBC 3.8* 3.8*  NEUTROABS 2.3 2.3  HGB 8.1* 8.4*  HCT 24.8* 26.2*  MCV 124.6* 127.8*  PLT 170 169      Assessment/Plan: Transaminitis - s/p liver biopsy yesterday and results will be f/u as an outpt. Hemochromatosis gene pending. Transaminases and TB have increased slightly. Hgb stable. MRCP unrevealing for a source of abd pain. Continue supportive care. Ambulate with PT. If stable tomorrow, then could d/c from GI standpoint with outpt f/u of liver biopsy results and Hemochromatosis gene. Dr. Cristina Gong will f/u tomorrow.   Lear Ng 07/23/2020, 9:36 AM  Questions please call [phone] ID: [name], female   DOB: 08-20-1960, 59 y.o.   MRN: [id number]

## 2020-07-24 ENCOUNTER — Other Ambulatory Visit: Payer: Self-pay

## 2020-07-24 LAB — HEMOCHROMATOSIS DNA-PCR(C282Y,H63D)

## 2020-07-24 LAB — BASIC METABOLIC PANEL
Anion gap: 10 (ref 5–15)
BUN: <5 mg/dL — ABNORMAL LOW (ref 6–20)
CO2: 21 mmol/L — ABNORMAL LOW (ref 22–32)
Calcium: 8 mg/dL — ABNORMAL LOW (ref 8.9–10.3)
Chloride: 99 mmol/L (ref 98–111)
Creatinine, Ser: 0.42 mg/dL — ABNORMAL LOW (ref 0.44–1.00)
GFR, Estimated: >60 mL/min (ref >60)
Glucose, Bld: 88 mg/dL (ref 70–99)
Potassium: 3.3 mmol/L — ABNORMAL LOW (ref 3.5–5.1)
Sodium: 130 mmol/L — ABNORMAL LOW (ref 135–145)

## 2020-07-24 LAB — HEPATIC FUNCTION PANEL
ALT: 54 U/L — ABNORMAL HIGH (ref 0–44)
AST: 115 U/L — ABNORMAL HIGH (ref 15–41)
Albumin: 2.3 g/dL — ABNORMAL LOW (ref 3.5–5.0)
Alkaline Phosphatase: 104 U/L (ref 38–126)
Bilirubin, Direct: 8.2 mg/dL — ABNORMAL HIGH (ref 0.0–0.2)
Indirect Bilirubin: 5 mg/dL — ABNORMAL HIGH (ref 0.3–0.9)
Total Bilirubin: 13.2 mg/dL — ABNORMAL HIGH (ref 0.3–1.2)
Total Protein: 5.1 g/dL — ABNORMAL LOW (ref 6.5–8.1)

## 2020-07-24 LAB — CK: Total CK: 1468 U/L — ABNORMAL HIGH (ref 38–234)

## 2020-07-24 MED ORDER — POLYETHYLENE GLYCOL 3350 17 G PO PACK
17.0000 g | PACK | Freq: Once | ORAL | Status: AC
  Administered 2020-07-24: 17 g via ORAL

## 2020-07-24 MED ORDER — SENNA 8.6 MG PO TABS: 1.0000 | ORAL_TABLET | Freq: Every day | ORAL | Status: DC | PRN

## 2020-07-24 MED ORDER — BISACODYL 10 MG RE SUPP
10.0000 mg | Freq: Once | RECTAL | Status: AC
  Administered 2020-07-24: 10 mg via RECTAL
  Filled 2020-07-24: qty 1

## 2020-07-24 MED ORDER — FUROSEMIDE 10 MG/ML IJ SOLN
60.0000 mg | Freq: Once | INTRAMUSCULAR | Status: AC
  Administered 2020-07-24: 60 mg via INTRAVENOUS
  Filled 2020-07-24: qty 6

## 2020-07-24 MED ORDER — POLYETHYLENE GLYCOL 3350 17 G PO PACK: 17.0000 g | PACK | Freq: Every day | ORAL | Status: DC | PRN

## 2020-07-24 MED ORDER — SENNA 8.6 MG PO TABS
1.0000 | ORAL_TABLET | Freq: Once | ORAL | Status: AC
  Administered 2020-07-24: 8.6 mg via ORAL
  Filled 2020-07-24: qty 1

## 2020-07-24 MED ORDER — OXYCODONE HCL 5 MG PO TABS
5.0000 mg | ORAL_TABLET | ORAL | Status: DC | PRN
  Administered 2020-07-24 – 2020-07-27 (×9): 10 mg via ORAL
  Administered 2020-07-27: 5 mg via ORAL
  Administered 2020-07-27 – 2020-07-31 (×15): 10 mg via ORAL
  Administered 2020-07-31: 5 mg via ORAL
  Administered 2020-07-31 – 2020-08-06 (×18): 10 mg via ORAL
  Filled 2020-07-24 (×44): qty 2

## 2020-07-24 MED ORDER — POLYETHYLENE GLYCOL 3350 17 G PO PACK: 17.0000 g | PACK | Freq: Two times a day (BID) | ORAL | Status: DC

## 2020-07-24 MED ORDER — POTASSIUM CHLORIDE CRYS ER 20 MEQ PO TBCR
40.0000 meq | EXTENDED_RELEASE_TABLET | Freq: Once | ORAL | Status: AC
  Administered 2020-07-24: 40 meq via ORAL
  Filled 2020-07-24: qty 2

## 2020-07-24 MED ORDER — ENSURE ENLIVE PO LIQD
237.0000 mL | ORAL | Status: DC
  Administered 2020-07-25 – 2020-08-03 (×10): 237 mL via ORAL

## 2020-07-24 NOTE — Progress Notes (Signed)
Physical Therapy Treatment Patient Details Name: Judy Meyer MRN: [id number] DOB: Mar 01, 1961 Today's Date: 07/24/2020    History of Present Illness 59 y.o. female with medical history significant of fibroids, HTN, HLD, depression and admitted for nausea and abdominal pain, found to have Hyperbilirubinemia/ elevated LFT's    PT Comments    Pt very agreeable to mobilize. Pt performed exercises seated EOB and then ambulated in hallway with RW.  Pt returned to bed and reported fatigue end of session.   Follow Up Recommendations  Home health PT;Supervision for mobility/OOB     Equipment Recommendations  Rolling walker with 5" wheels    Recommendations for Other Services       Precautions / Restrictions Precautions Precautions: Fall    Mobility  Bed Mobility Overal bed mobility: Modified Independent             General bed mobility comments: increased time and effort  Transfers Overall transfer level: Needs assistance Equipment used: Rolling walker (2 wheeled) Transfers: Sit to/from Stand Sit to Stand: Supervision            Ambulation/Gait Ambulation/Gait assistance: Min guard Gait Distance (Feet): 100 Feet Assistive device: Rolling walker (2 wheeled) Gait Pattern/deviations: Step-through pattern;Decreased stride length     General Gait Details: pt appears more steady with RW then previous visit, cues for RW positioning, distance to tolerance   Stairs             Wheelchair Mobility    Modified Rankin (Stroke Patients Only)       Balance                                            Cognition Arousal/Alertness: Awake/alert Behavior During Therapy: WFL for tasks assessed/performed Overall Cognitive Status: Within Functional Limits for tasks assessed                                        Exercises General Exercises - Lower Extremity Ankle Circles/Pumps: AROM;Both;10 reps;Seated Long Arc Quad:  AROM;Both;10 reps;Seated Hip Flexion/Marching: AROM;Seated;10 reps;Both    General Comments        Pertinent Vitals/Pain Pain Assessment: No/denies pain Pain Intervention(s): Premedicated before session;Repositioned    Home Living                      Prior Function            PT Goals (current goals can now be found in the care plan section) Progress towards PT goals: Progressing toward goals    Frequency    Min 3X/week      PT Plan Current plan remains appropriate    Co-evaluation              AM-PAC PT "6 Clicks" Mobility   Outcome Measure  Help needed turning from your back to your side while in a flat bed without using bedrails?: A Little Help needed moving from lying on your back to sitting on the side of a flat bed without using bedrails?: A Little Help needed moving to and from a bed to a chair (including a wheelchair)?: A Little Help needed standing up from a chair using your arms (e.g., wheelchair or bedside chair)?: A Little Help needed to walk in hospital room?: A Little  Help needed climbing 3-5 steps with a railing? : A Little 6 Click Score: 18    End of Session Equipment Utilized During Treatment: Gait belt Activity Tolerance: Patient tolerated treatment well Patient left: in bed;with call bell/phone within reach   PT Visit Diagnosis: Difficulty in walking, not elsewhere classified (R26.2);Muscle weakness (generalized) (M62.81)     Time: 3568-6168 PT Time Calculation (min) (ACUTE ONLY): 11 min  Charges:  $Gait Training: 8-22 mins           Arlyce Dice, DPT Acute Rehabilitation Services Pager: [phone] Office: [phone]  York Ram E 07/24/2020, 12:08 PM

## 2020-07-24 NOTE — Progress Notes (Signed)
[facility] Progress Note  Judy Meyer 59 y.o. 1961/08/05  CC:  Transaminitis, RUQ abdominal pain   Subjective: Patient reports continued RUQ abdominal pain and nausea.  Has not had a BM since admission.  Miralax given 12/13 but not on 12/14 and 12/15.  Patient reports lower extremity weakness.  She states she is ambulating with a walker but prior to admission, she did not have difficulty ambulating without a walker or assistance.  ROS : Review of Systems  Cardiovascular: Negative for chest pain and palpitations.  Gastrointestinal: Positive for abdominal pain, constipation and nausea. Negative for blood in stool, diarrhea, heartburn, melena and vomiting.  Neurological: Positive for weakness. Negative for loss of consciousness.    Objective: Vital signs in last 24 hours: Vitals:   07/23/20 2057 07/24/20 0424  BP: 126/68 (!) 149/82  Pulse: 81 82  Resp: 15 15  Temp: 97.9 F (36.6 C) 98 F (36.7 C)  SpO2: 98% 100%    Physical Exam:  General:  Alert, oriented, cooperative, no distress  Head:  Normocephalic, without obvious abnormality, atraumatic  Eyes:  Deep icterus, EOMs intact  Lungs:   Clear to auscultation bilaterally, respirations unlabored  Heart:  Regular rate and rhythm, S1, S2 normal  Abdomen:   Soft with moderate RUQ tenderness to palpation, bowel sounds active all four quadrants,  no peritoneal signs  Extremities: +trace bilateral lower extremity edema  Pulses: Radial pulses 2+ and symmetric    Lab Results: Recent Labs    07/22/20 0430 07/23/20 0504 07/24/20 0454  NA 136 134* 130*  K 2.9* 3.8 3.3*  CL 105 105 99  CO2 19* 19* 21*  GLUCOSE 100* 83 88  BUN <5* <5* <5*  CREATININE 0.46 <0.30* 0.42*  CALCIUM 7.3* 7.5* 8.0*  MG 1.6* 1.8  --    Recent Labs    07/23/20 0504 07/24/20 0454  AST 122* 115*  ALT 54* 54*  ALKPHOS 91 104  BILITOT 12.8* 13.2*  PROT 5.0* 5.1*  ALBUMIN 2.2* 2.3*   Recent Labs    07/22/20 0430 07/23/20 0504   WBC 3.8* 3.8*  NEUTROABS 2.3 2.3  HGB 8.1* 8.4*  HCT 24.8* 26.2*  MCV 124.6* 127.8*  PLT 170 169   Recent Labs    07/22/20 1052  LABPROT 14.5  INR 1.2    Assessment: Abnormal LFTs: possible hemochromatosis, ?rhabdomyolysis. Alcoholic hepatitis less likely.  Hepatic discriminant function <32. -Liver biopsy 12/15 pending -T. Bili 13.2/ AST 115/ ALT 54/ ALP 104 -PT 14.5/ INR 1.2 as of 12/14 -Elevated iron saturation (88%), iron (191), decreased TIBC (216), and elevated ferritin (802).  Hemochromastosis DNA pending. -CK 1468, trending down -Mildly low ceruloplasmin (17.2) -Acute hepatitis panel negative -ANA, AMA, and ASMA negative -No alpha 1 antitrypsin deficiency  RUQ Abdominal pain: mildly distended fluid-filled gallbladder, no biliary dilation per CT 12/9.  RUQ [place] 12/10: Layering gallbladder sludge/stones. No definite evidence of acute Cholecystitis. Normal CBD (90mm).  MRCP did not reveal any source of pain.  Constipation  Bilateral lower extremity edema  Plan: Dulcolax suppository and Senna x 1, then Senna as needed.  Suggest neurology consultation due to acute bilateral lower extremity weakness.  Await liver biopsy results.  [facility] will follow.   Salley Slaughter PA-C 07/24/2020, 9:32 AM  Contact #  [phone]

## 2020-07-24 NOTE — Care Management Important Message (Signed)
Important Message  Patient Details IM Letter given to the Patient. Name: Judy Meyer MRN: [id number] Date of Birth: 1961-05-23   Medicare Important Message Given:  Yes     Kerin Salen 07/24/2020, 11:05 AM

## 2020-07-24 NOTE — Progress Notes (Signed)
Patient ID: Judy Meyer, female   DOB: Jan 15, 1961, 59 y.o.   MRN: [id number]  PROGRESS NOTE    Judy Meyer  XBJ:[id number] DOB: [birth year] DOA: 07/17/2020 PCP: Pcp, No   Brief Narrative:  59  yo female with PMH hypothyroidism, hypertension, uterine fibroids, hyperlipidemia, depression who presented to the ER with worsening nausea and vomiting with jaundice, decreased strength, weight loss. On work-up she was found to have multiple lab abnormalities.  Also underwent ultrasound abdomen which showed gallbladder stones and sludge. LFTs were elevated and she was also found to have iron excess.  GI was consulted on admission as well.   Assessment & Plan:   Abdominal pain Abnormal LFTs -Ultrasound showed cholelithiasis and sludge but no evidence of acute cholecystitis. -Unable to do HIDA because of total bilirubin being more than 4.5 as per radiology -MRCP was negative for any acute findings without evidence of gallstones, obstruction lesions or biliary dilatation -GI following.  Status post liver biopsy by IR on 07/22/2020: Pathology pending -Still has abdominal pain requiring IV morphine. -Total bilirubin still trending upwards, 13.2 today  Rhabdomyolysis -CK 5731 on admission with normal renal function.  Currently on IV fluids.  CK total improving to 1468 today.  Off iv fluids. -will give a dose of Lasix IV today because of lower extremity swelling.  Iron excess - elevated iron, sat ratio, and ferritin - unclear etiology; no chronic transfusions - follow up GI workup; testing of hemochromatosis and other etiologies at this time  Hypothyroidism -Continue levothyroxine  Acute lower UTI -Culture negative.  DC'd Rocephin on 07/22/2020.  Moderate malnutrition -Follow nutrition recommendations  Macrocytic anemia -B12 normal.  Folate low.  Continue folic acid supplementation  Hypertension -Continue amlodipine  Hyperlipidemia -Statin on hold because of abnormal  LFTs  Hypokalemia -Replace.  Hypomagnesemia -Improved  DVT prophylaxis: SCDs Code Status: Full Family Communication: None at bedside Disposition Plan: Status is: Inpatient  Remains inpatient appropriate because:Inpatient level of care appropriate due to severity of illness   Dispo: The patient is from: Home              Anticipated d/c is to: Home              Anticipated d/c date is: 1 day              Patient currently is not medically stable to d/c.   Consultants: GI/IR  Procedures: Liver biopsy by IR on 07/22/2020  Antimicrobials: Rocephin from 07/18/2020-07/22/2020   Subjective: Patient seen and examined at bedside.  States that her lower extremities are swollen.  Still complains of intermittent abdominal pain requiring intravenous morphine.  No overnight fever or worsening shortness of breath reported  objective: Vitals:   07/23/20 0418 07/23/20 1311 07/23/20 2057 07/24/20 0424  BP: 136/80 122/82 126/68 (!) 149/82  Pulse: 72 81 81 82  Resp: 14 20 15 15   Temp: 97.9 F (36.6 C) 98.2 F (36.8 C) 97.9 F (36.6 C) 98 F (36.7 C)  TempSrc: Oral Oral Oral Oral  SpO2: 100% 100% 98% 100%  Weight:      Height:        Intake/Output Summary (Last 24 hours) at 07/24/2020 1146 Last data filed at 07/24/2020 0900 Gross per 24 hour  Intake 360 ml  Output --  Net 360 ml   Filed Weights   07/17/20 2016  Weight: 63.5 kg    Examination:  General exam: No distress.  Looks chronically ill.  Icterus present. Respiratory system: Bilateral decreased  air sounds at bases with some scattered crackles cardiovascular system: S1-S2 heard, rate controlled Gastrointestinal system: Abdomen is slightly distended, soft and still mildly tender in the right upper quadrant.  Normal bowel sounds heard Extremities: 1-2+ pitting edema in bilateral lower extremities.  No cyanosis  Central nervous system: Alert and oriented.  No focal neurological deficits.  Moving extremities  skin: No  obvious lesions/rashes Psychiatry: Flat affect   Data Reviewed: I have personally reviewed following labs and imaging studies  CBC: Recent Labs  Lab 07/19/20 0511 07/20/20 0629 07/21/20 0418 07/22/20 0430 07/23/20 0504  WBC 6.2 6.5 5.3 3.8* 3.8*  NEUTROABS 4.1 4.0 3.5 2.3 2.3  HGB 9.0* 9.8* 8.8* 8.1* 8.4*  HCT 27.6* 29.5* 27.3* 24.8* 26.2*  MCV 122.7* 122.4* 124.1* 124.6* 127.8*  PLT 147* 160 164 170 937   Basic Metabolic Panel: Recent Labs  Lab 07/18/20 0132 07/18/20 0500 07/19/20 0511 07/20/20 0439 07/21/20 0418 07/22/20 0430 07/23/20 0504 07/24/20 0454  NA  --  137 135 131* 134* 136 134* 130*  K  --  3.2* 3.4* 3.2* 3.6 2.9* 3.8 3.3*  CL  --  98 98 99 102 105 105 99  CO2  --  23 23 20* 23 19* 19* 21*  GLUCOSE  --  101* 70 99 76 100* 83 88  BUN  --  11 10 7  <5* <5* <5* <5*  CREATININE  --  0.69 0.63 0.47 0.36* 0.46 <0.30* 0.42*  CALCIUM  --  7.6* 7.2* 7.2* 7.3* 7.3* 7.5* 8.0*  MG 1.5* 1.5* 2.3 1.9 1.8 1.6* 1.8  --   PHOS 2.0* 2.0*  --   --   --   --   --   --    GFR: Estimated Creatinine Clearance: 71.8 mL/min (A) (by C-G formula based on SCr of 0.42 mg/dL (L)). Liver Function Tests: Recent Labs  Lab 07/20/20 0439 07/21/20 0418 07/22/20 0430 07/23/20 0504 07/24/20 0454  AST 160* 139*  137* 115* 122* 115*  ALT 63* 61*  59* 52* 54* 54*  ALKPHOS 94 90  87 86 91 104  BILITOT 10.7* 11.2*  11.2* 11.1* 12.8* 13.2*  PROT 5.3* 5.0*  5.0* 4.6* 5.0* 5.1*  ALBUMIN 2.4* 2.3*  2.3* 2.1* 2.2* 2.3*   Recent Labs  Lab 07/17/20 2021 07/19/20 0603 07/21/20 0418  LIPASE 53* 35 37   Recent Labs  Lab 07/18/20 0500  AMMONIA 44*   Coagulation Profile: Recent Labs  Lab 07/18/20 0132 07/18/20 0500 07/22/20 1052  INR 1.3* 1.3* 1.2   Cardiac Enzymes: Recent Labs  Lab 07/20/20 0629 07/21/20 0418 07/22/20 0430 07/23/20 0504 07/24/20 0454  CKTOTAL 3,532* 3,102* 2,198* 1,881* 1,468*   BNP (last 3 results) No results for input(s): PROBNP in the last  8760 hours. HbA1C: No results for input(s): HGBA1C in the last 72 hours. CBG: Recent Labs  Lab 07/22/20 2131  GLUCAP 94   Lipid Profile: No results for input(s): CHOL, HDL, LDLCALC, TRIG, CHOLHDL, LDLDIRECT in the last 72 hours. Thyroid Function Tests: No results for input(s): TSH, T4TOTAL, FREET4, T3FREE, THYROIDAB in the last 72 hours. Anemia Panel: No results for input(s): VITAMINB12, FOLATE, FERRITIN, TIBC, IRON, RETICCTPCT in the last 72 hours. Sepsis Labs: Recent Labs  Lab 07/18/20 0139 07/18/20 0747 07/18/20 1052  LATICACIDVEN 4.2* 2.6* 2.5*    Recent Results (from the past 240 hour(s))  Culture, Urine     Status: None   Collection Time: 07/17/20  9:00 PM   Specimen: Urine, Random  Result  Value Ref Range Status   Specimen Description   Final    URINE, RANDOM Performed at [facility] [address] [place], [place] [address]    Special Requests   Final    NONE Performed at [facility], [place] [address] [place], [place] [address]    Culture   Final    NO GROWTH Performed at [facility], [place] [address] [place], [place] [address]    Report Status 07/19/2020 FINAL  Final  Resp Panel by RT-PCR (Flu A&B, Covid) Nasopharyngeal Swab     Status: None   Collection Time: 07/18/20 12:36 AM   Specimen: Nasopharyngeal Swab; Nasopharyngeal(NP) swabs in vial transport medium  Result Value Ref Range Status   SARS Coronavirus 2 by RT PCR NEGATIVE NEGATIVE Final    Comment: (NOTE) SARS-CoV-2 target nucleic acids are NOT DETECTED.  The SARS-CoV-2 RNA is generally detectable in upper respiratory specimens during the acute phase of infection. The lowest concentration of SARS-CoV-2 viral copies this assay can detect is 138 copies/mL. A negative result does not preclude SARS-Cov-2 infection and should not be used as the sole basis for treatment or other patient management decisions. A negative result may occur with  improper  specimen collection/handling, submission of specimen other than nasopharyngeal swab, presence of viral mutation(s) within the areas targeted by this assay, and inadequate number of viral copies(<138 copies/mL). A negative result must be combined with clinical observations, patient history, and epidemiological information. The expected result is Negative.  Fact Sheet for Patients:  EntrepreneurPulse.com.au  Fact Sheet for Healthcare Providers:  IncredibleEmployment.be  This test is no t yet approved or cleared by the [place] FDA and  has been authorized for detection and/or diagnosis of SARS-CoV-2 by FDA under an Emergency Use Authorization (EUA). This EUA will remain  in effect (meaning this test can be used) for the duration of the COVID-19 declaration under Section 564(b)(1) of the Act, 21 U.S.C.section 360bbb-3(b)(1), unless the authorization is terminated  or revoked sooner.       Influenza A by PCR NEGATIVE NEGATIVE Final   Influenza B by PCR NEGATIVE NEGATIVE Final    Comment: (NOTE) The Xpert Xpress SARS-CoV-2/FLU/RSV plus assay is intended as an aid in the diagnosis of influenza from Nasopharyngeal swab specimens and should not be used as a sole basis for treatment. Nasal washings and aspirates are unacceptable for Xpert Xpress SARS-CoV-2/FLU/RSV testing.  Fact Sheet for Patients: EntrepreneurPulse.com.au  Fact Sheet for Healthcare Providers: IncredibleEmployment.be  This test is not yet approved or cleared by the [place] FDA and has been authorized for detection and/or diagnosis of SARS-CoV-2 by FDA under an Emergency Use Authorization (EUA). This EUA will remain in effect (meaning this test can be used) for the duration of the COVID-19 declaration under Section 564(b)(1) of the Act, 21 U.S.C. section 360bbb-3(b)(1), unless the authorization is terminated or revoked.  Performed at  [facility], [place] [address] [place], [place] [address]          Radiology Studies: US BIOPSY (LIVER)  Result Date: 07/22/2020 INDICATION: 59 year old female with a history of liver failure EXAM: ULTRASOUND-GUIDED MEDICAL LIVER BIOPSY MEDICATIONS: None. ANESTHESIA/SEDATION: Moderate (conscious) sedation was employed during this procedure. A total of Versed 2.0 mg and Fentanyl 100 mcg was administered intravenously. Moderate Sedation Time: 10 minutes. The patient's level of consciousness and vital signs were monitored continuously by radiology nursing throughout the procedure under my direct supervision. FLUOROSCOPY TIME:  Ultrasound COMPLICATIONS:  None PROCEDURE: Informed written consent was obtained from the patient after a thorough discussion of the procedural risks, benefits and alternatives. All questions were addressed. Maximal Sterile Barrier Technique was utilized including caps, mask, sterile gowns, sterile gloves, sterile drape, hand hygiene and skin antiseptic. A timeout was performed prior to the initiation of the procedure. Ultrasound survey of the right liver lobe performed with images stored and sent to PACs. The right lower thorax/right upper abdomen was prepped with chlorhexidine in a sterile fashion, and a sterile drape was applied covering the operative field. A sterile gown and sterile gloves were used for the procedure. Local anesthesia was provided with 1% Lidocaine. The patient was prepped and draped sterilely and the skin and subcutaneous tissues were generously infiltrated with 1% lidocaine. A 17 gauge introducer needle was then advanced under ultrasound guidance in an intercostal location into the right liver lobe. The stylet was removed, and multiple separate 18 gauge core biopsy were retrieved. Samples were placed into fresh specimen/saline for transportation to the lab. Gel-Foam pledgets were then infused with a small amount of saline for assistance with  hemostasis. The needle was removed, and a final ultrasound image was performed. The patient tolerated the procedure well and remained hemodynamically stable throughout. No complications were encountered and no significant blood loss was encounter. IMPRESSION: Status post ultrasound-guided medical liver biopsy. Signed, Dulcy Fanny. Dellia Nims, RPVI [facility] Electronically Signed   By: Corrie Mckusick D.O.   On: 07/22/2020 16:54        Scheduled Meds: . amLODipine  5 mg Oral Daily  . bisacodyl  10 mg Rectal Once  . feeding supplement  237 mL Oral BID BM  . folic acid  1 mg Oral Daily  . furosemide  60 mg Intravenous Once  . levothyroxine  75 mcg Oral Q0600  . polyethylene glycol  17 g Oral Once  . senna  1 tablet Oral Once  . thiamine  100 mg Oral Daily  . venlafaxine XR  75 mg Oral Daily   Continuous Infusions:         Aline August, MD [facility] 07/24/2020, 11:46 AM

## 2020-07-24 NOTE — Progress Notes (Addendum)
Initial Nutrition Assessment  DOCUMENTATION CODES:   Non-severe (moderate) malnutrition in context of acute illness/injury  INTERVENTION:   Continue Ensure Enlive po once daily, each supplement provides 350 kcal and 20 grams of protein  Updated weight  NUTRITION DIAGNOSIS:   Moderate Malnutrition related to acute illness,nausea,vomiting as evidenced by moderate fat depletion,moderate muscle depletion,energy intake < or equal to 75% for > or equal to 1 month.  Ongoing   GOAL:   Patient will meet greater than or equal to 90% of their needs  Ongoing  MONITOR:   PO intake,Supplement acceptance,Weight trends,I & O's,Labs  REASON FOR ASSESSMENT:   Consult Assessment of nutrition requirement/status  ASSESSMENT:   59 y.o. female with medical history significant of fibroids, HTN, HLD, depression,    Admitted for hyperbilirubinemia, dehydration, hypokalemia  Pt reports her appetite is varied during admission. Pt reports that before her admission, she was unable to eat or drink anything for 2 weeks PTA. Prior to this, she felt nauseous for 2 months and had difficultly consuming very much. During this admission, she is able to consume foods without vomiting but still feels nauseous. Pt reports her nausea is not always associated with food consumption. Pt reports dry heaving this AM before breakfast because of nausea. Pt reports she is on Zofran but states this does not always control her nausea like she hopes.  Pt reports no nausea or abdominal discomfort when consuming Ensure. Pt reports consuming one Ensure a day.   Per chart review, pt consumed 100% of breakfast and lunch today. This is a total of 979 kcals and 44 grams of protein.   Noted that pt has not had a BM since estimated on 12/2. Pt has PRN bowel regimen ordered.   Pt denies weight loss but said she was unsure since she has been admitted and unable to weigh herself. Per chart review, pt weight is stable but last recorded  weight was her admission weight. An updated weight would provide more details on weight status.   Medications reviewed and include: Folvite, thiamine, Amlodipine  Labs reviewed: Sodium 130, Potassium 3.3, Iron 191   Diet Order:   Diet Order            Diet Heart Room service appropriate? Yes; Fluid consistency: Thin  Diet effective now                 EDUCATION NEEDS:   No education needs have been identified at this time  Skin:  Skin Assessment: Reviewed RN Assessment  Last BM:  estimated 12/2  Height:   Ht Readings from Last 1 Encounters:  07/17/20 5\' 6"  (1.676 m)    Weight:   Wt Readings from Last 1 Encounters:  07/17/20 63.5 kg    BMI:  Body mass index is 22.6 kg/m.  Estimated Nutritional Needs:   Kcal:  1800-2000  Protein:  75-85g  Fluid:  2L/day   Ronnald Nian, Dietetic Intern Pager: [phone] If unavailable: [phone]

## 2020-07-25 LAB — COMPREHENSIVE METABOLIC PANEL
ALT: 59 U/L — ABNORMAL HIGH (ref 0–44)
AST: 141 U/L — ABNORMAL HIGH (ref 15–41)
Albumin: 2.6 g/dL — ABNORMAL LOW (ref 3.5–5.0)
Alkaline Phosphatase: 116 U/L (ref 38–126)
Anion gap: 12 (ref 5–15)
BUN: <5 mg/dL — ABNORMAL LOW (ref 6–20)
CO2: 23 mmol/L (ref 22–32)
Calcium: 8.7 mg/dL — ABNORMAL LOW (ref 8.9–10.3)
Chloride: 95 mmol/L — ABNORMAL LOW (ref 98–111)
Creatinine, Ser: 0.65 mg/dL (ref 0.44–1.00)
GFR, Estimated: >60 mL/min (ref >60)
Glucose, Bld: 88 mg/dL (ref 70–99)
Potassium: 3.7 mmol/L (ref 3.5–5.1)
Sodium: 130 mmol/L — ABNORMAL LOW (ref 135–145)
Total Bilirubin: 16.8 mg/dL — ABNORMAL HIGH (ref 0.3–1.2)
Total Protein: 6 g/dL — ABNORMAL LOW (ref 6.5–8.1)

## 2020-07-25 LAB — MAGNESIUM: Magnesium: 1.6 mg/dL — ABNORMAL LOW (ref 1.7–2.4)

## 2020-07-25 LAB — SURGICAL PATHOLOGY

## 2020-07-25 MED ORDER — MAGNESIUM SULFATE 2 GM/50ML IV SOLN
2.0000 g | Freq: Once | INTRAVENOUS | Status: AC
  Administered 2020-07-25: 2 g via INTRAVENOUS
  Filled 2020-07-25: qty 50

## 2020-07-25 MED ORDER — FUROSEMIDE 10 MG/ML IJ SOLN
60.0000 mg | Freq: Once | INTRAMUSCULAR | Status: AC
  Administered 2020-07-25: 60 mg via INTRAVENOUS
  Filled 2020-07-25: qty 6

## 2020-07-25 MED ORDER — SENNA 8.6 MG PO TABS: 1.0000 | ORAL_TABLET | Freq: Every day | ORAL | Status: DC | PRN

## 2020-07-25 MED ORDER — BISACODYL 10 MG RE SUPP: 10.0000 mg | Freq: Every day | RECTAL | Status: DC | PRN

## 2020-07-25 MED ORDER — SENNA 8.6 MG PO TABS
1.0000 | ORAL_TABLET | Freq: Two times a day (BID) | ORAL | Status: DC
  Administered 2020-07-25 – 2020-08-03 (×2): 8.6 mg via ORAL
  Filled 2020-07-25 (×12): qty 1

## 2020-07-25 MED ORDER — POLYETHYLENE GLYCOL 3350 17 G PO PACK
17.0000 g | PACK | Freq: Every day | ORAL | Status: DC
  Administered 2020-07-25 – 2020-08-06 (×9): 17 g via ORAL
  Filled 2020-07-25 (×11): qty 1

## 2020-07-25 MED ORDER — POLYETHYLENE GLYCOL 3350 17 G PO PACK: 17.0000 g | PACK | Freq: Every day | ORAL | Status: DC | PRN

## 2020-07-25 MED ORDER — POTASSIUM CHLORIDE CRYS ER 20 MEQ PO TBCR
40.0000 meq | EXTENDED_RELEASE_TABLET | Freq: Once | ORAL | Status: AC
  Administered 2020-07-25: 40 meq via ORAL
  Filled 2020-07-25: qty 2

## 2020-07-25 NOTE — Progress Notes (Addendum)
No real change.  Total bilirubin continues to creep upward, CK continues to creep downward.  The patient continues to have her right upper quadrant pain, basically unchanged since admission.  On exam, there is no significant tenderness.  General surgery has seen the patient and feels, as I do, that despite the presence of gallstones, there is not an indication for surgical intervention at this time.  The patient's liver biopsy shows severe steatosis with intrahepatic cholestasis, but does not really give a clear explanation for the reason behind the cholestasis.  Clinically, there is no evidence of encephalopathy.  I continue to think that this is probably a drug-induced liver injury (the patient, when carefully asked, denies any exposure to toxins, unusual supplements, etc.) and I have pharmacy consulting to see if medications, such as her Nortrel prior to admission, can cause cholestasis "out of the blue" when they have been on it for a long time without problems.  The patient did develop diarrhea today, in response to a Dulcolax suppository and Senokot tablet.  (There have been concerns of constipation in view of no bowel movements since admission.)  She indicates that physical therapy is helping her ambulation significantly.  Plan: Continue observation.  Await further input from pharmacy consult.  Cleotis Nipper, M.D. Pager [phone] If no answer or after 5 PM call [phone]

## 2020-07-25 NOTE — Progress Notes (Signed)
Patient ID: Judy Meyer, female   DOB: October 21, 1960, 59 y.o.   MRN: [id number]  PROGRESS NOTE    RUCHEL BRANDENBURGER  ONG:[id number] DOB: 1960-11-03 DOA: 07/17/2020 PCP: Pcp, No   Brief Narrative:  59  yo female with PMH hypothyroidism, hypertension, uterine fibroids, hyperlipidemia, depression who presented to the ER with worsening nausea and vomiting with jaundice, decreased strength, weight loss. On work-up she was found to have multiple lab abnormalities.  Also underwent ultrasound abdomen which showed gallbladder stones and sludge. LFTs were elevated and she was also found to have iron excess.  GI was consulted on admission as well.   Assessment & Plan:   Abdominal pain Abnormal LFTs -Ultrasound showed cholelithiasis and sludge but no evidence of acute cholecystitis. -Unable to do HIDA because of total bilirubin being more than 4.5 as per radiology -MRCP was negative for any acute findings without evidence of gallstones, obstruction lesions or biliary dilatation -GI following.  Status post liver biopsy by IR on 07/22/2020: Pathology pending -Still has abdominal pain requiring IV morphine. -Total bilirubin still trending upwards, 16.8 today  Rhabdomyolysis -CK 5731 on admission with normal renal function.  Currently on IV fluids.  CK total improving to 1468 today.  Off iv fluids.  Bilateral lower extremity swelling -Received a dose of IV Lasix on 07/24/2020.  Lower extremities still swollen.  Will give 1 more dose of IV Lasix today.  Iron excess - elevated iron, sat ratio, and ferritin - unclear etiology; no chronic transfusions - follow up GI workup; testing of hemochromatosis and other etiologies at this time  Hypothyroidism -Continue levothyroxine  Acute lower UTI -Culture negative.  DC'd Rocephin on 07/22/2020.  Moderate malnutrition -Follow nutrition recommendations  Macrocytic anemia Folic acid deficiency -B12 normal.  Folate low.  Continue folic acid  supplementation  Hypertension -Continue amlodipine  Hyperlipidemia -Statin on hold because of abnormal LFTs  Hypokalemia -Improved.  Hypomagnesemia -Replace.  Repeat a.m. labs  Generalized conditioning -Will need home health PT  DVT prophylaxis: SCDs Code Status: Full Family Communication: None at bedside Disposition Plan: Status is: Inpatient  Remains inpatient appropriate because:Inpatient level of care appropriate due to severity of illness   Dispo: The patient is from: Home              Anticipated d/c is to: Home              Anticipated d/c date is: 1 day              Patient currently is not medically stable to d/c.   Consultants: GI/IR  Procedures: Liver biopsy by IR on 07/22/2020  Antimicrobials: Rocephin from 07/18/2020-07/22/2020   Subjective: Patient seen and examined at bedside.  Does not feel that well; still complains of intermittent abdominal pain and required IV morphine last night.  Feels that her legs are still swollen but IV Lasix help yesterday.  Objective: Vitals:   07/24/20 0424 07/24/20 1254 07/24/20 2209 07/25/20 0600  BP: (!) 149/82 121/75 114/86 107/83  Pulse: 82 75 83 84  Resp: 15 14 16 18   Temp: 98 F (36.7 C) 98.2 F (36.8 C) 98 F (36.7 C) 97.8 F (36.6 C)  TempSrc: Oral Oral Oral Oral  SpO2: 100% 99% 96% 100%  Weight:      Height:        Intake/Output Summary (Last 24 hours) at 07/25/2020 1000 Last data filed at 07/24/2020 1500 Gross per 24 hour  Intake 240 ml  Output 1500 ml  Net -  1260 ml   Filed Weights   07/17/20 2016  Weight: 63.5 kg    Examination:  General exam: Poor historian.  No acute distress.  Looks chronically ill.  Icterus present. Respiratory system: Decreased breath sounds at bases bilaterally, no wheezing cardiovascular system: Rate controlled, S1-S2 heard  gastrointestinal system: Abdomen is still slightly distended, soft and still mildly tender in the right upper quadrant.  Bowel sounds are  heard Extremities: No clubbing.  Bilateral lower extremities still have 1-2+ pitting edema Central nervous system: Awake and alert.  No focal neurological deficits.  Moves extremities.   Skin: No obvious ecchymosis/lesions  psychiatry: Anxious, intermittently crying.   Data Reviewed: I have personally reviewed following labs and imaging studies  CBC: Recent Labs  Lab 07/19/20 0511 07/20/20 0629 07/21/20 0418 07/22/20 0430 07/23/20 0504  WBC 6.2 6.5 5.3 3.8* 3.8*  NEUTROABS 4.1 4.0 3.5 2.3 2.3  HGB 9.0* 9.8* 8.8* 8.1* 8.4*  HCT 27.6* 29.5* 27.3* 24.8* 26.2*  MCV 122.7* 122.4* 124.1* 124.6* 127.8*  PLT 147* 160 164 170 413   Basic Metabolic Panel: Recent Labs  Lab 07/20/20 0439 07/21/20 0418 07/22/20 0430 07/23/20 0504 07/24/20 0454 07/25/20 0506  NA 131* 134* 136 134* 130* 130*  K 3.2* 3.6 2.9* 3.8 3.3* 3.7  CL 99 102 105 105 99 95*  CO2 20* 23 19* 19* 21* 23  GLUCOSE 99 76 100* 83 88 88  BUN 7 <5* <5* <5* <5* <5*  CREATININE 0.47 0.36* 0.46 <0.30* 0.42* 0.65  CALCIUM 7.2* 7.3* 7.3* 7.5* 8.0* 8.7*  MG 1.9 1.8 1.6* 1.8  --  1.6*   GFR: Estimated Creatinine Clearance: 71.8 mL/min (by C-G formula based on SCr of 0.65 mg/dL). Liver Function Tests: Recent Labs  Lab 07/21/20 0418 07/22/20 0430 07/23/20 0504 07/24/20 0454 07/25/20 0506  AST 139*  137* 115* 122* 115* 141*  ALT 61*  59* 52* 54* 54* 59*  ALKPHOS 90  87 86 91 104 116  BILITOT 11.2*  11.2* 11.1* 12.8* 13.2* 16.8*  PROT 5.0*  5.0* 4.6* 5.0* 5.1* 6.0*  ALBUMIN 2.3*  2.3* 2.1* 2.2* 2.3* 2.6*   Recent Labs  Lab 07/19/20 0603 07/21/20 0418  LIPASE 35 37   No results for input(s): AMMONIA in the last 168 hours. Coagulation Profile: Recent Labs  Lab 07/22/20 1052  INR 1.2   Cardiac Enzymes: Recent Labs  Lab 07/20/20 0629 07/21/20 0418 07/22/20 0430 07/23/20 0504 07/24/20 0454  CKTOTAL 3,532* 3,102* 2,198* 1,881* 1,468*   BNP (last 3 results) No results for input(s): PROBNP in the  last 8760 hours. HbA1C: No results for input(s): HGBA1C in the last 72 hours. CBG: Recent Labs  Lab 07/22/20 2131  GLUCAP 94   Lipid Profile: No results for input(s): CHOL, HDL, LDLCALC, TRIG, CHOLHDL, LDLDIRECT in the last 72 hours. Thyroid Function Tests: No results for input(s): TSH, T4TOTAL, FREET4, T3FREE, THYROIDAB in the last 72 hours. Anemia Panel: No results for input(s): VITAMINB12, FOLATE, FERRITIN, TIBC, IRON, RETICCTPCT in the last 72 hours. Sepsis Labs: Recent Labs  Lab 07/18/20 1052  LATICACIDVEN 2.5*    Recent Results (from the past 240 hour(s))  Culture, Urine     Status: None   Collection Time: 07/17/20  9:00 PM   Specimen: Urine, Random  Result Value Ref Range Status   Specimen Description   Final    URINE, RANDOM Performed at [address], [place], [place] [address]    Special Requests   Final  NONE Performed at [facility], [place] [address], [place], [place] [address]    Culture   Final    NO GROWTH Performed at [facility], [place] [address] [place], [place] [address]    Report Status 07/19/2020 FINAL  Final  Resp Panel by RT-PCR (Flu A&B, Covid) Nasopharyngeal Swab     Status: None   Collection Time: 07/18/20 12:36 AM   Specimen: Nasopharyngeal Swab; Nasopharyngeal(NP) swabs in vial transport medium  Result Value Ref Range Status   SARS Coronavirus 2 by RT PCR NEGATIVE NEGATIVE Final    Comment: (NOTE) SARS-CoV-2 target nucleic acids are NOT DETECTED.  The SARS-CoV-2 RNA is generally detectable in upper respiratory specimens during the acute phase of infection. The lowest concentration of SARS-CoV-2 viral copies this assay can detect is 138 copies/mL. A negative result does not preclude SARS-Cov-2 infection and should not be used as the sole basis for treatment or other patient management decisions. A negative result may occur with  improper specimen collection/handling, submission  of specimen other than nasopharyngeal swab, presence of viral mutation(s) within the areas targeted by this assay, and inadequate number of viral copies(<138 copies/mL). A negative result must be combined with clinical observations, patient history, and epidemiological information. The expected result is Negative.  Fact Sheet for Patients:  EntrepreneurPulse.com.au  Fact Sheet for Healthcare Providers:  IncredibleEmployment.be  This test is no t yet approved or cleared by the [place] FDA and  has been authorized for detection and/or diagnosis of SARS-CoV-2 by FDA under an Emergency Use Authorization (EUA). This EUA will remain  in effect (meaning this test can be used) for the duration of the COVID-19 declaration under Section 564(b)(1) of the Act, 21 U.S.C.section 360bbb-3(b)(1), unless the authorization is terminated  or revoked sooner.       Influenza A by PCR NEGATIVE NEGATIVE Final   Influenza B by PCR NEGATIVE NEGATIVE Final    Comment: (NOTE) The Xpert Xpress SARS-CoV-2/FLU/RSV plus assay is intended as an aid in the diagnosis of influenza from Nasopharyngeal swab specimens and should not be used as a sole basis for treatment. Nasal washings and aspirates are unacceptable for Xpert Xpress SARS-CoV-2/FLU/RSV testing.  Fact Sheet for Patients: EntrepreneurPulse.com.au  Fact Sheet for Healthcare Providers: IncredibleEmployment.be  This test is not yet approved or cleared by the [place] FDA and has been authorized for detection and/or diagnosis of SARS-CoV-2 by FDA under an Emergency Use Authorization (EUA). This EUA will remain in effect (meaning this test can be used) for the duration of the COVID-19 declaration under Section 564(b)(1) of the Act, 21 U.S.C. section 360bbb-3(b)(1), unless the authorization is terminated or revoked.  Performed at [facility], [place]  [address] [place], [place] [address]          Radiology Studies: No results found.      Scheduled Meds: . amLODipine  5 mg Oral Daily  . feeding supplement  237 mL Oral Q24H  . folic acid  1 mg Oral Daily  . levothyroxine  75 mcg Oral Q0600  . thiamine  100 mg Oral Daily  . venlafaxine XR  75 mg Oral Daily   Continuous Infusions:         Aline August, MD [facility] 07/25/2020, 10:00 AM

## 2020-07-25 NOTE — Progress Notes (Signed)
On rounding asked for Morphine, Explanation given why Morphine was given. Oxycodone 10mg   Po will be given.

## 2020-07-25 NOTE — Consult Note (Addendum)
Reason for Consult: Gallbladder sludge Referring Physician: Alekh/Buccini  Judy Meyer is an 59 y.o. female.  HPI:  Pt is a 59 yo F who was admitted to [facility] 07/18/2020 with jaundice, nausea/vomiting, and diarrhea.  Pain was not a big complaint upon admission.  She was also very weak and hypokalemic.  Her workup this week has been unrevealing with no evidence of obstructing lesion or liver lesion, liver biopsy only shows steatosis, negative autoimmune workup, negative hepatitis panel.  She has sludge seen on imaging.  She still feels a bit weak and is still jaundiced, but the nausea has mostly resolved.  She does still have diarrhea.  T bili was 12 upon admission and is 16 today.  Fractionation shows higher direct bilirubin.    Past Medical History:  Diagnosis Date  . Hypertension   . Hypothyroidism     History reviewed. No pertinent surgical history.  Family History  Problem Relation Age of Onset  . Hypertension Other     Social History:  reports that she has never smoked. She has never used smokeless tobacco. She reports current alcohol use. She reports previous drug use.  Allergies:  Allergies  Allergen Reactions  . Compazine [Prochlorperazine Edisylate] Anaphylaxis  . Periactin [Cyproheptadine] Anaphylaxis  . Cyclobenzaprine     Palpations   . Haloperidol And Related   . Metoclopramide Other (See Comments)  . Other Other (See Comments)  . Prochlorperazine Other (See Comments)    Medications:  Current Meds  Medication Sig  . albuterol (VENTOLIN HFA) 108 (90 Base) MCG/ACT inhaler Inhale 2 puffs into the lungs 4 (four) times daily as needed for wheezing or shortness of breath.  Marland Kitchen amLODipine (NORVASC) 5 MG tablet Take 1 tablet (5 mg total) by mouth daily.  . eszopiclone (LUNESTA) 1 MG TABS tablet Take 1 mg by mouth at bedtime as needed for sleep.  . Galcanezumab-gnlm 120 MG/ML SOAJ Inject 120 mLs into the skin every 30 (thirty) days.  Marland Kitchen levothyroxine (SYNTHROID,  LEVOTHROID) 75 MCG tablet Take 75 mcg by mouth daily before breakfast.  . norethindrone-ethinyl estradiol (NORTREL 7/7/7) 0.5/0.75/1-35 MG-MCG tablet Take 1 tablet by mouth daily.  . rosuvastatin (CRESTOR) 5 MG tablet Take 5 mg by mouth daily.  Marland Kitchen venlafaxine XR (EFFEXOR-XR) 75 MG 24 hr capsule Take 75 mg by mouth daily.     Results for orders placed or performed during the hospital encounter of 07/17/20 (from the past 48 hour(s))  CK     Status: Abnormal   Collection Time: 07/24/20  4:54 AM  Result Value Ref Range   Total CK 1,468 (H) 38 - 234 U/L    Comment: Performed at [facility], [place] [address], [place], [place] [address]  Basic metabolic panel     Status: Abnormal   Collection Time: 07/24/20  4:54 AM  Result Value Ref Range   Sodium 130 (L) 135 - 145 mmol/L   Potassium 3.3 (L) 3.5 - 5.1 mmol/L   Chloride 99 98 - 111 mmol/L   CO2 21 (L) 22 - 32 mmol/L   Glucose, Bld 88 70 - 99 mg/dL    Comment: Glucose reference range applies only to samples taken after fasting for at least 8 hours.   BUN <5 (L) 6 - 20 mg/dL   Creatinine, Ser 0.42 (L) 0.44 - 1.00 mg/dL   Calcium 8.0 (L) 8.9 - 10.3 mg/dL   GFR, Estimated >60 >60 mL/min    Comment: (NOTE) Calculated using the CKD-EPI Creatinine Equation (  2021)    Anion gap 10 5 - 15    Comment: Performed at [facility], [place] [address] [place], [place] [address]  Hepatic function panel     Status: Abnormal   Collection Time: 07/24/20  4:54 AM  Result Value Ref Range   Total Protein 5.1 (L) 6.5 - 8.1 g/dL   Albumin 2.3 (L) 3.5 - 5.0 g/dL   AST 115 (H) 15 - 41 U/L   ALT 54 (H) 0 - 44 U/L   Alkaline Phosphatase 104 38 - 126 U/L   Total Bilirubin 13.2 (H) 0.3 - 1.2 mg/dL   Bilirubin, Direct 8.2 (H) 0.0 - 0.2 mg/dL   Indirect Bilirubin 5.0 (H) 0.3 - 0.9 mg/dL    Comment: Performed at [facility], [place] [address] [place] [address], [place], [place] [address]  Comprehensive metabolic panel     Status:  Abnormal   Collection Time: 07/25/20  5:06 AM  Result Value Ref Range   Sodium 130 (L) 135 - 145 mmol/L   Potassium 3.7 3.5 - 5.1 mmol/L   Chloride 95 (L) 98 - 111 mmol/L   CO2 23 22 - 32 mmol/L   Glucose, Bld 88 70 - 99 mg/dL    Comment: Glucose reference range applies only to samples taken after fasting for at least 8 hours.   BUN <5 (L) 6 - 20 mg/dL   Creatinine, Ser 0.65 0.44 - 1.00 mg/dL   Calcium 8.7 (L) 8.9 - 10.3 mg/dL   Total Protein 6.0 (L) 6.5 - 8.1 g/dL   Albumin 2.6 (L) 3.5 - 5.0 g/dL   AST 141 (H) 15 - 41 U/L   ALT 59 (H) 0 - 44 U/L   Alkaline Phosphatase 116 38 - 126 U/L   Total Bilirubin 16.8 (H) 0.3 - 1.2 mg/dL   GFR, Estimated >60 >60 mL/min    Comment: (NOTE) Calculated using the CKD-EPI Creatinine Equation (2021)    Anion gap 12 5 - 15    Comment: Performed at [facility], [place] [address], [place], [place] [address]  Magnesium     Status: Abnormal   Collection Time: 07/25/20  5:06 AM  Result Value Ref Range   Magnesium 1.6 (L) 1.7 - 2.4 mg/dL    Comment: Performed at [facility], [place] [address] [place],  [address]    No results found.  Review of Systems  Constitutional: Positive for fatigue.  HENT: Negative.   Eyes: Negative.   Respiratory: Negative.   Cardiovascular: Negative.   Gastrointestinal: Positive for abdominal pain, diarrhea, nausea and vomiting. Negative for blood in stool and constipation.  Endocrine: Negative.   Genitourinary: Negative.   Musculoskeletal: Negative.   Skin: Negative.   Allergic/Immunologic: Negative.   Neurological: Negative.   Hematological: Negative.   Psychiatric/Behavioral: Negative.   All other systems reviewed and are negative.   Blood pressure 135/69, pulse 85, temperature 98.6 F (37 C), temperature source Oral, resp. rate 11, height 5\' 6"  (1.676 m), weight 63.5 kg, SpO2 99 %. Physical Exam Vitals reviewed.  Constitutional:      Appearance: She is  well-developed. She is ill-appearing.  HENT:     Head: Normocephalic and atraumatic.  Eyes:     General: Scleral icterus present.     Extraocular Movements: Extraocular movements intact.     Pupils: Pupils are equal, round, and reactive to light.  Cardiovascular:     Rate and Rhythm: Normal rate and regular rhythm.  Heart sounds: Normal heart sounds.  Pulmonary:     Effort: Pulmonary effort is normal. No respiratory distress.     Breath sounds: Normal breath sounds. No stridor. No wheezing or rhonchi.  Abdominal:     General: Abdomen is flat. There is no distension. There are no signs of injury.     Palpations: Abdomen is soft. There is hepatomegaly. There is no shifting dullness, fluid wave, splenomegaly, mass or pulsatile mass.     Tenderness: There is abdominal tenderness in the right upper quadrant and epigastric area. There is no guarding or rebound. Negative signs include Murphy's sign.     Hernia: No hernia is present.  Skin:    General: Skin is warm and dry.     Coloration: Skin is jaundiced.  Neurological:     General: No focal deficit present.     Mental Status: She is alert and oriented to person, place, and time.  Psychiatric:        Mood and Affect: Mood is anxious. Mood is not depressed.        Behavior: Behavior normal.      Assessment/Plan: Jaundice Gallbladder sludge  Gallbladder sludge is not causing the jaundice.  Clinically, it sounds as though the patient had an infectious illness that caused hepatitis.    She has no evidence of biliary obstruction, liver mass, or pancreatic head mass.    Would not recommend lap chole for these symptoms.    Call for further questions.    Stark Klein 07/25/2020, 2:12 PM

## 2020-07-25 NOTE — Progress Notes (Signed)
OT Cancellation Note  Patient Details Name: Judy Meyer MRN: [id number] DOB: [birth year]   Cancelled Treatment:    Reason Eval/Treat Not Completed: Patient declined, reports been up to bathroom multiple times to have bowel movements "I'm exhausted, but please come by tomorrow" Will re-attempt 12/18 as schedule permits.  Judy Meyer OT OT pager: [phone]   Judy Meyer 07/25/2020, 2:30 PM

## 2020-07-25 NOTE — Progress Notes (Signed)
Appears to be disoriented but easily to be oriented, Spilled ensure all over self and bed. Completed bed changed. Will cont to assess Neuro status.

## 2020-07-25 NOTE — Plan of Care (Signed)
  Problem: Clinical Measurements: Goal: Ability to maintain clinical measurements within normal limits will improve Outcome: Progressing Goal: Will remain free from infection Outcome: Progressing   Problem: Activity: Goal: Risk for activity intolerance will decrease Outcome: Progressing   Problem: Elimination: Goal: Will not experience complications related to bowel motility Outcome: Progressing   Problem: Pain Managment: Goal: General experience of comfort will improve Outcome: Progressing   Problem: Safety: Goal: Ability to remain free from injury will improve Outcome: Progressing   Problem: Skin Integrity: Goal: Risk for impaired skin integrity will decrease Outcome: Progressing   

## 2020-07-25 NOTE — Progress Notes (Signed)
On  Rounding states I spill water over me while I was sleeping. OOB to [facility] with assistance/ Complete bead changed and help get wash up. Acting a little disoriented after Morphine given.Will cont to assess neuro status.

## 2020-07-26 ENCOUNTER — Inpatient Hospital Stay (HOSPITAL_COMMUNITY): Payer: Medicare PPO

## 2020-07-26 DIAGNOSIS — R7401 Elevation of levels of liver transaminase levels: Secondary | ICD-10-CM

## 2020-07-26 DIAGNOSIS — M7989 Other specified soft tissue disorders: Secondary | ICD-10-CM

## 2020-07-26 DIAGNOSIS — R17 Unspecified jaundice: Secondary | ICD-10-CM

## 2020-07-26 DIAGNOSIS — E538 Deficiency of other specified B group vitamins: Secondary | ICD-10-CM

## 2020-07-26 LAB — COMPREHENSIVE METABOLIC PANEL
ALT: 55 U/L — ABNORMAL HIGH (ref 0–44)
AST: 157 U/L — ABNORMAL HIGH (ref 15–41)
Albumin: 2.4 g/dL — ABNORMAL LOW (ref 3.5–5.0)
Alkaline Phosphatase: 108 U/L (ref 38–126)
Anion gap: 14 (ref 5–15)
BUN: <5 mg/dL — ABNORMAL LOW (ref 6–20)
CO2: 23 mmol/L (ref 22–32)
Calcium: 8.7 mg/dL — ABNORMAL LOW (ref 8.9–10.3)
Chloride: 95 mmol/L — ABNORMAL LOW (ref 98–111)
Creatinine, Ser: 0.5 mg/dL (ref 0.44–1.00)
GFR, Estimated: >60 mL/min (ref >60)
Glucose, Bld: 73 mg/dL (ref 70–99)
Potassium: 3.3 mmol/L — ABNORMAL LOW (ref 3.5–5.1)
Sodium: 132 mmol/L — ABNORMAL LOW (ref 135–145)
Total Bilirubin: 17.4 mg/dL — ABNORMAL HIGH (ref 0.3–1.2)
Total Protein: 5.6 g/dL — ABNORMAL LOW (ref 6.5–8.1)

## 2020-07-26 LAB — CBC WITH DIFFERENTIAL/PLATELET
Abs Immature Granulocytes: 0.06 10*3/uL (ref 0.00–0.07)
Basophils Absolute: 0 10*3/uL (ref 0.0–0.1)
Basophils Relative: 0 %
Eosinophils Absolute: 0 10*3/uL (ref 0.0–0.5)
Eosinophils Relative: 0 %
HCT: 28.6 % — ABNORMAL LOW (ref 36.0–46.0)
Hemoglobin: 9.5 g/dL — ABNORMAL LOW (ref 12.0–15.0)
Immature Granulocytes: 1 %
Lymphocytes Relative: 26 %
Lymphs Abs: 1.4 10*3/uL (ref 0.7–4.0)
MCH: 39.6 pg — ABNORMAL HIGH (ref 26.0–34.0)
MCHC: 33.2 g/dL (ref 30.0–36.0)
MCV: 119.2 fL — ABNORMAL HIGH (ref 80.0–100.0)
Monocytes Absolute: 1.2 10*3/uL — ABNORMAL HIGH (ref 0.1–1.0)
Monocytes Relative: 21 %
Neutro Abs: 2.9 10*3/uL (ref 1.7–7.7)
Neutrophils Relative %: 52 %
Platelets: 169 10*3/uL (ref 150–400)
RBC: 2.4 MIL/uL — ABNORMAL LOW (ref 3.87–5.11)
RDW: 16.5 % — ABNORMAL HIGH (ref 11.5–15.5)
WBC: 5.5 10*3/uL (ref 4.0–10.5)
nRBC: 0 % (ref 0.0–0.2)

## 2020-07-26 LAB — PROTIME-INR
INR: 1.2 (ref 0.8–1.2)
Prothrombin Time: 14.4 seconds (ref 11.4–15.2)

## 2020-07-26 LAB — MAGNESIUM: Magnesium: 1.8 mg/dL (ref 1.7–2.4)

## 2020-07-26 IMAGING — DX DG WRIST 2V*R*
2 series · 2 of 2 positions shown · non-contrast
Comparison: None.

CLINICAL DATA: Fell on outstretched hand. Right wrist pain. Initial
encounter.

EXAM:
RIGHT WRIST - 2 VIEW

[wrist pa]
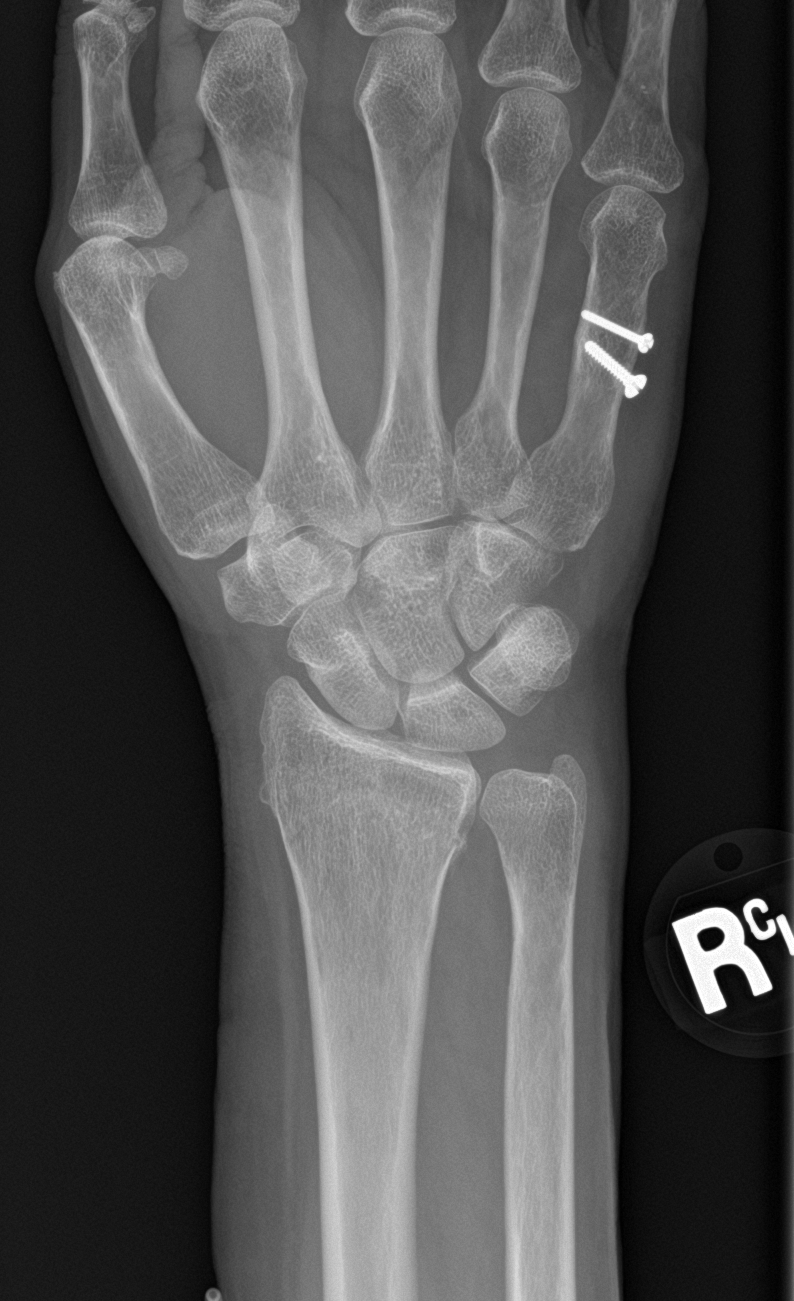

[wrist lat]
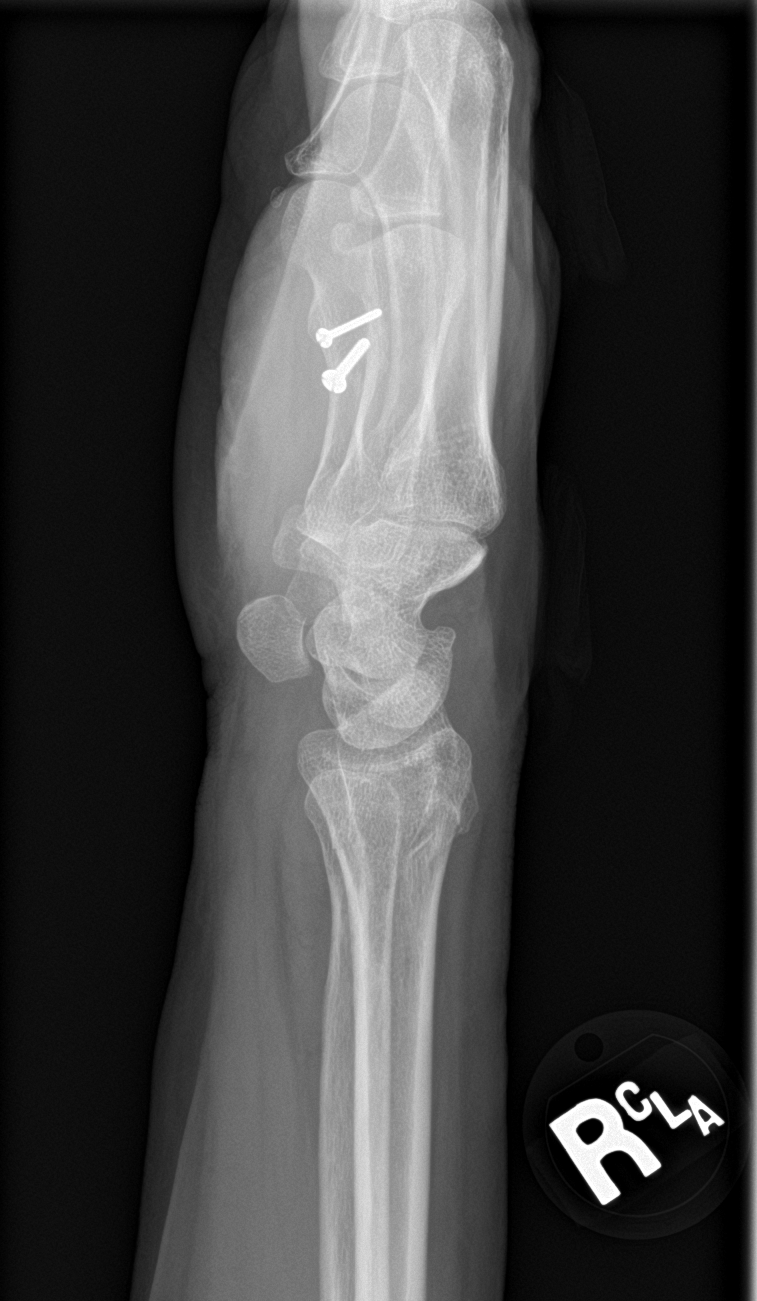

[2 of 2 positions shown; findings below may reference images not displayed]

FINDINGS: Nondisplaced fracture is seen involving distal radial metaphysis. No
other acute fractures identified. No evidence of dislocation.
Internal fixation screws noted in the 5th metacarpal shaft.
IMPRESSION: Nondisplaced distal radial metaphyseal fracture.

## 2020-07-26 MED ORDER — FUROSEMIDE 10 MG/ML IJ SOLN
40.0000 mg | Freq: Once | INTRAMUSCULAR | Status: AC
  Administered 2020-07-27: 40 mg via INTRAVENOUS
  Filled 2020-07-26: qty 4

## 2020-07-26 MED ORDER — FUROSEMIDE 10 MG/ML IJ SOLN
40.0000 mg | Freq: Once | INTRAMUSCULAR | Status: AC
  Administered 2020-07-26: 40 mg via INTRAVENOUS
  Filled 2020-07-26: qty 4

## 2020-07-26 MED ORDER — POTASSIUM CHLORIDE CRYS ER 20 MEQ PO TBCR
40.0000 meq | EXTENDED_RELEASE_TABLET | Freq: Once | ORAL | Status: AC
  Administered 2020-07-26: 40 meq via ORAL
  Filled 2020-07-26: qty 2

## 2020-07-26 MED ORDER — MAGNESIUM SULFATE 4 GM/100ML IV SOLN
4.0000 g | Freq: Once | INTRAVENOUS | Status: AC
  Administered 2020-07-26: 4 g via INTRAVENOUS
  Filled 2020-07-26: qty 100

## 2020-07-26 NOTE — Progress Notes (Signed)
Orthopedic Tech Progress Note Patient Details:  MELA PERHAM 26-Jan-1961 [id number]  Ortho Devices Type of Ortho Device: Velcro wrist forearm splint Ortho Device/Splint Location: RUE Ortho Device/Splint Interventions: Ordered,Application   Post Interventions Patient Tolerated: Well Instructions Provided: Care of device   Braulio Bosch 07/26/2020, 2:05 PM

## 2020-07-26 NOTE — Progress Notes (Addendum)
Patient feels about the same.  Pt feel in bathroom and has broken wrist (splinted).  Continued rise in bilirubin noted.  It has risen from 12.8 to 17.4 over the past 3 days.  INR remains stable.  CK not checked for a couple of days, but will recheck it tomorrow.  EXAM: Jaundiced, fully alert, not at all encephalopathic.  Pleasant, cooperative.  Note: Patient indicated to me that she is still taking Nortrel, because she brought some pills with her from home and she has been taking them on her own accord here in the hospital, to prevent dysfunctional uterine bleeding (on a prior hospitalization, she had severe bleeding when her Nortrel was stopped, and she did not want that to happen again).  Recommendations:  1.  In view of the progressive rise in bilirubin and the absence of a clearly defined pathophysiology for her liver disease, I think transfer to a tertiary [facility] would be advisable.  I do not think the patient is headed for transplant, but their expertise in defining her liver disease and how to intervene on it would be very helpful.  I discussed this with the patient and she is agreeable to the idea.  I have called both [facility] and [facility], and basically, it appears we have to go through their transfer center.  I have discussed the patient's case with Dr. Irine Seal (hospitalist) and will update him on the above.  2.  I would recommend gynecologic consultation to address the dysfunctional uterine bleeding, and to see if there are any alternatives that they could recommend with respect to the Nortrel.  Cleotis Nipper, M.D. Pager [phone] If no answer or after 5 PM call [phone]

## 2020-07-26 NOTE — Progress Notes (Signed)
   07/26/20 0506  What Happened  Was fall witnessed? No  Was patient injured? Unsure  Patient found other (Comment) (in bed)  Follow Up  MD notified [place]  Time MD notified [phone]  Additional tests  (awaiting md response)  Simple treatment Other (comment) (pain medication)  Adult Fall Risk Assessment  Risk Factor Category (scoring not indicated) Not Applicable  Age 59  Fall History: Fall within 6 months prior to admission 0  Elimination; Bowel and/or Urine Incontinence 0  Elimination; Bowel and/or Urine Urgency/Frequency 0  Medications: includes PCA/Opiates, Anti-convulsants, Anti-hypertensives, Diuretics, Hypnotics, Laxatives, Sedatives, and Psychotropics 3  Patient Care Equipment 0  Mobility-Assistance 0  Mobility-Gait 2  Mobility-Sensory Deficit 0  Altered awareness of immediate physical environment 0  Impulsiveness 0  Lack of understanding of one's physical/cognitive limitations 0  Total Score 5  Patient Fall Risk Level Moderate fall risk  Adult Fall Risk Interventions  Required Bundle Interventions *See Row Information* Moderate fall risk - low and moderate requirements implemented  Additional Interventions Use of appropriate toileting equipment (bedpan, BSC, etc.)  When bringing in pain medication pt states that she fell in the bathroom on her buttocks attempting to clean urine off the floor. When arrived to room pt was in bed.  Pt had rung the bell but pt did not state that she fell at that time.  Pt with complaints of right wrist pain. Md paged.  Will await orders. Pt did not hit head.  No changes in pt neuro status.

## 2020-07-26 NOTE — Progress Notes (Signed)
PROGRESS NOTE    Judy Meyer  FMB:[id number] DOB: 1961-03-09 DOA: 07/17/2020 PCP: Pcp, No    Chief Complaint  Patient presents with   Vomiting   Abdominal Pain    Brief Narrative:  59 yo female with PMH hypothyroidism, hypertension, uterine fibroids, hyperlipidemia, depression who presented to the ER with worsening nausea and vomiting with jaundice, decreased strength, weight loss. On work-up she was found to have multiple lab abnormalities. Also underwent ultrasound abdomen which showed gallbladder stones and sludge. LFTs were elevated and she was also found to have iron excess.  GI was consulted on admission as well.General surgery also consulted and felt no need for surgical intervention at this time. Patient underwent ultrasound-guided liver biopsy which was consistent with severe hepatic steatosis and intrahepatic cholestasis. Bilirubin continues to rise. GI following.   Assessment & Plan:   Principal Problem:   Abnormal liver function Active Problems:   Hypokalemia   Hypothyroidism   Hyperlipidemia   HTN (hypertension)   Acute lower UTI   Macrocytic anemia   Iron excess   Malnutrition of moderate degree   Rhabdomyolysis  1 transaminitis/hyperbilirubinemia/right upper quadrant abdominal pain Questionable etiology. Right upper quadrant ultrasound done showed cholelithiasis and sludge but no evidence of acute cholecystitis. MRCP which was done was negative for any acute findings without evidence of gallstones, obstructive lesions or biliary dilatation. HIDA scan was unable to be done as total bilirubin noted to be elevated greater than 4.5 as per radiology. Patient still with right upper quadrant abdominal pain. Total bilirubin still trending upwards currently 17.4. Patient jaundice. Patient underwent liver biopsy which was consistent with severe hepatic steatosis with intrahepatic cholestasis with no clear explanation. Acute hepatitis panel which was done was negative.  Patient noted to be on Nortrel for fibroids which she has taken for several years. Not sure not resumed during this hospitalization however per GI patient noted to have been taking her own supply in the room. Patient with no significant improvement with hyperbilirubinemia, right upper quadrant abdominal pain. GI following and if no significant improvement in the next 24 hours may need to be transferred to a tertiary care center for further evaluation and management. Appreciate GI input and recommendations.  2. Rhabdomyolysis CK on admission at 5731 with normal renal function. Patient with volume overload on examination as such we will discontinue IV fluids. Repeat CK in the morning. Follow.  3. Bilateral lower extremity edema Secondary to aggressive fluid resuscitation. Patient noted to be +10 L during this hospitalization. Patient received doses of IV Lasix. Will give another dose of Lasix 40 mg IV every 12 hours x2 doses. Strict I's and O's. Daily weights. Follow.  4. Iron excess Patient noted to have elevated 191, elevated set ratio of 88%, elevated ferritin. No chronic transfusions. Follow-up GI work-up, testing for hemochromatosis pending. Follow.  5. Hypothyroidism Continue Synthroid.  6.. Bacteria in urine Urine cultures negative. IV Rocephin discontinued.  7. Macrocytic anemia/folate deficiency B12 normal. Folate low. Continue folic acid daily.  8. Hypertension Norvasc.  9. Hyperlipidemia Continue to hold statin due to transaminitis.  10. Hypokalemia/hypomagnesemia K. Dur 40 mEq p.o. times 1. Magnesium sulfate 4 g IV x1. Repleted.   DVT prophylaxis: SCDs Code Status: Full Family Communication: Updated patient and boyfriend at bedside. Disposition:   Status is: Inpatient    Dispo: The patient is from: Home              Anticipated d/c is to: Home  Anticipated d/c date is: To be determined.              Patient currently jaundice with hyperbilirubinemia  unknown etiology with some right upper quadrant abdominal pain. Not stable for discharge.       Consultants:   General surgery: Dr. Barry Dienes 07-25-2020  Interventional radiology: Dr. Earleen Newport 07-22-2020  Gastroenterology: Dr. Therisa Doyne 07-18-2020  Procedures:   CT abdomen and pelvis 07-17-2020  Plain films of the right wrist 07-26-2020  MRCP 07-21-2020  Right upper quadrant ultrasound 07-18-2020  Ultrasound-guided liver biopsy 07-22-2020 per Dr. Earleen Newport, IR  Antimicrobials:  IV Rocephin 07-18-2020 >>>> 07-22-2020   Subjective: Still with some c/o of RUQ pain with no significant change since admission. Fell tis mornong in bathroom. Patient stated she slipped on some urine as she was trying to wipe it in the bathroom.  Objective: Vitals:   07/25/20 1038 07/25/20 1220 07/25/20 2209 07/26/20 0521  BP: 136/86 135/69 119/71 116/62  Pulse: 84 85 84 82  Resp: 15 11 18 18   Temp: 98.2 F (36.8 C) 98.6 F (37 C) 98 F (36.7 C) 98.6 F (37 C)  TempSrc: Oral Oral    SpO2: 100% 99% 99% 98%  Weight:      Height:        Intake/Output Summary (Last 24 hours) at 07/26/2020 1250 Last data filed at 07/25/2020 1500 Gross per 24 hour  Intake 240 ml  Output --  Net 240 ml   Filed Weights   07/17/20 2016  Weight: 63.5 kg    Examination:  General exam: Appears calm and comfortable. Jaundiced. Respiratory system: Clear to auscultation. Respiratory effort normal. Cardiovascular system: S1 & S2 heard, RRR. No JVD, murmurs, rubs, gallops or clicks. 2-3+ bilateral lower extremity edema. Gastrointestinal system: Abdomen is nondistended, soft and some tenderness to palpation right upper quadrant. No rebound. No guarding. Positive bowel sounds.  Central nervous system: Alert and oriented. No focal neurological deficits. Extremities: Symmetric 5 x 5 power. 2-3+ bilateral lower extremity edema. Skin: No rashes, lesions or ulcers Psychiatry: Judgement and insight appear normal. Mood &  affect appropriate.     Data Reviewed: I have personally reviewed following labs and imaging studies  CBC: Recent Labs  Lab 07/20/20 0629 07/21/20 0418 07/22/20 0430 07/23/20 0504 07/26/20 0405  WBC 6.5 5.3 3.8* 3.8* 5.5  NEUTROABS 4.0 3.5 2.3 2.3 2.9  HGB 9.8* 8.8* 8.1* 8.4* 9.5*  HCT 29.5* 27.3* 24.8* 26.2* 28.6*  MCV 122.4* 124.1* 124.6* 127.8* 119.2*  PLT 160 164 170 169 016    Basic Metabolic Panel: Recent Labs  Lab 07/21/20 0418 07/22/20 0430 07/23/20 0504 07/24/20 0454 07/25/20 0506 07/26/20 0405  NA 134* 136 134* 130* 130* 132*  K 3.6 2.9* 3.8 3.3* 3.7 3.3*  CL 102 105 105 99 95* 95*  CO2 23 19* 19* 21* 23 23  GLUCOSE 76 100* 83 88 88 73  BUN <5* <5* <5* <5* <5* <5*  CREATININE 0.36* 0.46 <0.30* 0.42* 0.65 0.50  CALCIUM 7.3* 7.3* 7.5* 8.0* 8.7* 8.7*  MG 1.8 1.6* 1.8  --  1.6* 1.8    GFR: Estimated Creatinine Clearance: 71.8 mL/min (by C-G formula based on SCr of 0.5 mg/dL).  Liver Function Tests: Recent Labs  Lab 07/22/20 0430 07/23/20 0504 07/24/20 0454 07/25/20 0506 07/26/20 0405  AST 115* 122* 115* 141* 157*  ALT 52* 54* 54* 59* 55*  ALKPHOS 86 91 104 116 108  BILITOT 11.1* 12.8* 13.2* 16.8* 17.4*  PROT 4.6* 5.0* 5.1* 6.0*  5.6*  ALBUMIN 2.1* 2.2* 2.3* 2.6* 2.4*    CBG: Recent Labs  Lab 07/22/20 2131  GLUCAP 94     Recent Results (from the past 240 hour(s))  Culture, Urine     Status: None   Collection Time: 07/17/20  9:00 PM   Specimen: Urine, Random  Result Value Ref Range Status   Specimen Description   Final    URINE, RANDOM Performed at [facility], [place] [address], [place], [place] [address]    Special Requests   Final    NONE Performed at [facility], [place] [address], [place], [place] [address]    Culture   Final    NO GROWTH Performed at [facility], [place] [address], [place], [place] [address]    Report Status 07/19/2020 FINAL  Final  Resp Panel by RT-PCR (Flu A&B,  Covid) Nasopharyngeal Swab     Status: None   Collection Time: 07/18/20 12:36 AM   Specimen: Nasopharyngeal Swab; Nasopharyngeal(NP) swabs in vial transport medium  Result Value Ref Range Status   SARS Coronavirus 2 by RT PCR NEGATIVE NEGATIVE Final    Comment: (NOTE) SARS-CoV-2 target nucleic acids are NOT DETECTED.  The SARS-CoV-2 RNA is generally detectable in upper respiratory specimens during the acute phase of infection. The lowest concentration of SARS-CoV-2 viral copies this assay can detect is 138 copies/mL. A negative result does not preclude SARS-Cov-2 infection and should not be used as the sole basis for treatment or other patient management decisions. A negative result may occur with  improper specimen collection/handling, submission of specimen other than nasopharyngeal swab, presence of viral mutation(s) within the areas targeted by this assay, and inadequate number of viral copies(<138 copies/mL). A negative result must be combined with clinical observations, patient history, and epidemiological information. The expected result is Negative.  Fact Sheet for Patients:  EntrepreneurPulse.com.au  Fact Sheet for Healthcare Providers:  IncredibleEmployment.be  This test is no t yet approved or cleared by the [place] FDA and  has been authorized for detection and/or diagnosis of SARS-CoV-2 by FDA under an Emergency Use Authorization (EUA). This EUA will remain  in effect (meaning this test can be used) for the duration of the COVID-19 declaration under Section 564(b)(1) of the Act, 21 U.S.C.section 360bbb-3(b)(1), unless the authorization is terminated  or revoked sooner.       Influenza A by PCR NEGATIVE NEGATIVE Final   Influenza B by PCR NEGATIVE NEGATIVE Final    Comment: (NOTE) The Xpert Xpress SARS-CoV-2/FLU/RSV plus assay is intended as an aid in the diagnosis of influenza from Nasopharyngeal swab specimens and should  not be used as a sole basis for treatment. Nasal washings and aspirates are unacceptable for Xpert Xpress SARS-CoV-2/FLU/RSV testing.  Fact Sheet for Patients: EntrepreneurPulse.com.au  Fact Sheet for Healthcare Providers: IncredibleEmployment.be  This test is not yet approved or cleared by the [place] FDA and has been authorized for detection and/or diagnosis of SARS-CoV-2 by FDA under an Emergency Use Authorization (EUA). This EUA will remain in effect (meaning this test can be used) for the duration of the COVID-19 declaration under Section 564(b)(1) of the Act, 21 U.S.C. section 360bbb-3(b)(1), unless the authorization is terminated or revoked.  Performed at [facility], [place] [address], [place], [place] [address]          Radiology Studies: DG Wrist 2 Views Right  Result Date: 07/26/2020 CLINICAL DATA:  [address] on outstretched hand. Right wrist pain. Initial  encounter. EXAM: RIGHT WRIST - 2 VIEW COMPARISON:  None. FINDINGS: Nondisplaced fracture is seen involving distal radial metaphysis. No other acute fractures identified. No evidence of dislocation. Internal fixation screws noted in the 5th metacarpal shaft. IMPRESSION: Nondisplaced distal radial metaphyseal fracture. Electronically Signed   By: Marlaine Hind M.D.   On: 07/26/2020 05:59        Scheduled Meds:  amLODipine  5 mg Oral Daily   feeding supplement  237 mL Oral H68S   folic acid  1 mg Oral Daily   levothyroxine  75 mcg Oral Q0600   polyethylene glycol  17 g Oral Daily   senna  1 tablet Oral BID   thiamine  100 mg Oral Daily   venlafaxine XR  75 mg Oral Daily   Continuous Infusions:  magnesium sulfate bolus IVPB 4 g (07/26/20 1125)     LOS: 8 days    Time spent: 35 minutes    Irine Seal, MD [facility]   To contact the attending provider between 7A-7P or the covering provider during after hours 7P-7A, please log  into the web site [email or web address] and access using universal [place] password for that web site. If you do not have the password, please call the hospital operator.  07/26/2020, 12:50 PM

## 2020-07-26 NOTE — Progress Notes (Signed)
Orthopedic Tech Progress Note Patient Details:  REBECKAH MASIH 12-Jun-1961 [id number]  Ortho Devices Type of Ortho Device: Ace wrap,Volar splint Ortho Device/Splint Location: RUE Ortho Device/Splint Interventions: Ordered,Application   Post Interventions Patient Tolerated: Well Instructions Provided: Care of device   Braulio Bosch 07/26/2020, 10:46 AM

## 2020-07-26 NOTE — Progress Notes (Signed)
Orthopedic Tech Progress Note Patient Details:  ALANNA STORTI Dec 29, 1960 [id number] Double ortho visit charge  Patient ID: RENATTA SHRIEVES, female   DOB: 13-Apr-1961, 59 y.o.   MRN: [id number]   Braulio Bosch 07/26/2020, 3:36 PM

## 2020-07-27 LAB — COMPREHENSIVE METABOLIC PANEL
ALT: 53 U/L — ABNORMAL HIGH (ref 0–44)
AST: 164 U/L — ABNORMAL HIGH (ref 15–41)
Albumin: 2.3 g/dL — ABNORMAL LOW (ref 3.5–5.0)
Alkaline Phosphatase: 104 U/L (ref 38–126)
Anion gap: 12 (ref 5–15)
BUN: <5 mg/dL — ABNORMAL LOW (ref 6–20)
CO2: 25 mmol/L (ref 22–32)
Calcium: 8.3 mg/dL — ABNORMAL LOW (ref 8.9–10.3)
Chloride: 93 mmol/L — ABNORMAL LOW (ref 98–111)
Creatinine, Ser: 0.62 mg/dL (ref 0.44–1.00)
GFR, Estimated: >60 mL/min (ref >60)
Glucose, Bld: 92 mg/dL (ref 70–99)
Potassium: 2.8 mmol/L — ABNORMAL LOW (ref 3.5–5.1)
Sodium: 130 mmol/L — ABNORMAL LOW (ref 135–145)
Total Bilirubin: 18 mg/dL — ABNORMAL HIGH (ref 0.3–1.2)
Total Protein: 5.4 g/dL — ABNORMAL LOW (ref 6.5–8.1)

## 2020-07-27 LAB — CBC
HCT: 27.4 % — ABNORMAL LOW (ref 36.0–46.0)
Hemoglobin: 9 g/dL — ABNORMAL LOW (ref 12.0–15.0)
MCH: 38.6 pg — ABNORMAL HIGH (ref 26.0–34.0)
MCHC: 32.8 g/dL (ref 30.0–36.0)
MCV: 117.6 fL — ABNORMAL HIGH (ref 80.0–100.0)
Platelets: 209 10*3/uL (ref 150–400)
RBC: 2.33 MIL/uL — ABNORMAL LOW (ref 3.87–5.11)
RDW: 16.6 % — ABNORMAL HIGH (ref 11.5–15.5)
WBC: 5.1 10*3/uL (ref 4.0–10.5)
nRBC: 0 % (ref 0.0–0.2)

## 2020-07-27 LAB — CK: Total CK: 623 U/L — ABNORMAL HIGH (ref 38–234)

## 2020-07-27 LAB — MAGNESIUM: Magnesium: 2.1 mg/dL (ref 1.7–2.4)

## 2020-07-27 MED ORDER — POTASSIUM CHLORIDE CRYS ER 20 MEQ PO TBCR
40.0000 meq | EXTENDED_RELEASE_TABLET | ORAL | Status: AC
  Administered 2020-07-27 (×2): 40 meq via ORAL
  Filled 2020-07-27 (×2): qty 2

## 2020-07-27 MED ORDER — FUROSEMIDE 10 MG/ML IJ SOLN
40.0000 mg | Freq: Once | INTRAMUSCULAR | Status: AC
  Administered 2020-07-27: 40 mg via INTRAVENOUS
  Filled 2020-07-27: qty 4

## 2020-07-27 NOTE — Plan of Care (Signed)
  Problem: Education: Goal: Knowledge of General Education information will improve Description: Including pain rating scale, medication(s)/side effects and non-pharmacologic comfort measures Outcome: Progressing   Problem: Health Behavior/Discharge Planning: Goal: Ability to manage health-related needs will improve Outcome: Progressing   Problem: Clinical Measurements: Goal: Ability to maintain clinical measurements within normal limits will improve Outcome: Progressing Goal: Will remain free from infection Outcome: Progressing Goal: Diagnostic test results will improve Outcome: Progressing   Problem: Activity: Goal: Risk for activity intolerance will decrease Outcome: Progressing   Problem: Nutrition: Goal: Adequate nutrition will be maintained Outcome: Progressing   Problem: Elimination: Goal: Will not experience complications related to bowel motility Outcome: Progressing Goal: Will not experience complications related to urinary retention Outcome: Progressing   Problem: Pain Managment: Goal: General experience of comfort will improve Outcome: Progressing   Problem: Safety: Goal: Ability to remain free from injury will improve Outcome: Progressing   Problem: Skin Integrity: Goal: Risk for impaired skin integrity will decrease Outcome: Progressing   

## 2020-07-27 NOTE — Progress Notes (Signed)
Patient's bilirubin continues to climb by approximately one half point per day.  Other labs are stable.  The patient actually feels pretty well today, and looks fine lying in bed apart from her jaundice.  She is chipper, alert, no evident distress.  Case discussed with Dr. Irine Seal.  At present, there are no beds available at [facility].  I suggested that tomorrow, if [facility] still is unable to provide an option for transfer, that Dr. Grandville Silos call our covering physician, who could try calling the local outpatient liver clinic sponsored by [facility].  Perhaps they could arrange to see the patient on an expedited basis as an outpatient, and/or they could facilitate transfer to [facility] for inpatient evaluation.  All things considered, I think inpatient evaluation would be preferable, because this is not necessarily an isolated liver condition: Her elevated CPK on admission, and associated severe lower extremity weakness, suggest some sort of systemic process going on.  In the inpatient setting, rheumatologic and/or neurologic evaluation could be undertaken, as well as gynecologic consultation for her dysfunctional uterine bleeding (her outpatient use of Nortrel, and her continued surreptitious inpatient use of Nortrel, could conceivably be related to her intrahepatic cholestasis).  Cleotis Nipper, M.D. Pager [phone] If no answer or after 5 PM call [phone]

## 2020-07-27 NOTE — Progress Notes (Signed)
PROGRESS NOTE    Judy Meyer  JHE:[id number] DOB: [birth year] DOA: 07/17/2020 PCP: Pcp, No    Chief Complaint  Patient presents with  . Vomiting  . Abdominal Pain    Brief Narrative:  59 yo female with PMH hypothyroidism, hypertension, uterine fibroids, hyperlipidemia, depression who presented to the ER with worsening nausea and vomiting with jaundice, decreased strength, weight loss. On work-up she was found to have multiple lab abnormalities. Also underwent ultrasound abdomen which showed gallbladder stones and sludge. LFTs were elevated and she was also found to have iron excess.  GI was consulted on admission as well.General surgery also consulted and felt no need for surgical intervention at this time. Patient underwent ultrasound-guided liver biopsy which was consistent with severe hepatic steatosis and intrahepatic cholestasis. Bilirubin continues to rise. GI following.   Assessment & Plan:   Principal Problem:   Abnormal liver function Active Problems:   Hypokalemia   Hypothyroidism   Hyperlipidemia   HTN (hypertension)   Acute lower UTI   Macrocytic anemia   Iron excess   Malnutrition of moderate degree   Rhabdomyolysis   Jaundice   Transaminitis   Hypomagnesemia   Swelling of lower extremity   Folate deficiency  1 transaminitis/hyperbilirubinemia/right upper quadrant abdominal pain Questionable etiology. Right upper quadrant ultrasound done showed cholelithiasis and sludge but no evidence of acute cholecystitis. MRCP which was done was negative for any acute findings without evidence of gallstones, obstructive lesions or biliary dilatation. HIDA scan was unable to be done as total bilirubin noted to be elevated greater than 4.5 as per radiology. Patient still with right upper quadrant abdominal pain. Total bilirubin still trending upwards currently 18. Patient jaundice. Patient underwent liver biopsy which was consistent with severe hepatic steatosis with  intrahepatic cholestasis with no clear explanation. Acute hepatitis panel which was done was negative. Patient noted to be on Nortrel for fibroids which she has taken for several years for dysfunctional uterine bleeding. Not sure not resumed during this hospitalization however per GI patient noted to have been taking her own supply in the room. Patient with no significant improvement with hyperbilirubinemia, right upper quadrant abdominal pain. GI following and recommending transfer to a tertiary care center for evaluation by a hepatologist.  Tried calling [facility] and told bed not available today and to try back again tomorrow.  GI following and I appreciate the input and recommendations.  2. Rhabdomyolysis CK on admission at 5731 with normal renal function. Patient with volume overload on examination as such IV fluids discontinued.  CK levels trending down currently at 623.   3. Bilateral lower extremity edema Secondary to aggressive fluid resuscitation also secondary to hypoalbuminemia. Patient noted to be +10 L during this hospitalization. Patient received doses of IV Lasix.  Over the past 24 hours.  Still with lower extremity edema.  Will give Lasix 40 mg IV every 12 hours x2 doses.  Strict I's and O's.  Daily weights.  Follow.   4. Iron excess Patient noted to have elevated 191, elevated set ratio of 88%, elevated ferritin. No chronic transfusions. Follow-up GI work-up, testing for hemochromatosis pending. Follow.  5. Hypothyroidism Synthroid.    6.. Bacteria in urine Urine cultures negative.  Status post IV Rocephin.  Antibiotics discontinued.  7. Macrocytic anemia/folate deficiency B12 normal. Folate low. Continue folic acid daily.  8. Hypertension Continue Norvasc.   9. Hyperlipidemia Continue to hold statin secondary to transaminitis.   10. Hypokalemia/hypomagnesemia Hypokalemia likely secondary to diuresis.  Potassium  at 2.8 this morning.  K. Dur 40 mEq p.o. every 4 hours  x2 doses.  Magnesium at 2.1.  Repeat labs in the morning.  11.  Nondisplaced distal radial metaphyseal fracture Secondary to mechanical fall.  Discussed with orthopedics who recommended Velcro wrist splint with outpatient follow-up.  12.  History of dysfunctional uterine bleeding/fibroids Patient noted to be on Nortrel and stated last time this was discontinued during the hospitalization she had significant uterine bleeding.  Concern nodule may in part be contributing to her worsening transaminitis.  Will discuss with OB/GYN and alternative.  Follow.   DVT prophylaxis: SCDs Code Status: Full Family Communication: Updated patient.  No family at bedside.   Disposition:   Status is: Inpatient    Dispo: The patient is from: Home              Anticipated d/c is to: Home              Anticipated d/c date is: To be determined.              Patient currently jaundice with hyperbilirubinemia unknown etiology with some right upper quadrant abdominal pain.  Awaiting transfer to a tertiary care center.  Not stable for discharge.       Consultants:   General surgery: Dr. Barry Dienes 07-25-2020  Interventional radiology: Dr. Earleen Newport 07-22-2020  Gastroenterology: Dr. Therisa Doyne 07-18-2020  Procedures:   CT abdomen and pelvis 07-17-2020  Plain films of the right wrist 07-26-2020  MRCP 07-21-2020  Right upper quadrant ultrasound 07-18-2020  Ultrasound-guided liver biopsy 07-22-2020 per Dr. Earleen Newport, IR  Antimicrobials:  IV Rocephin 07-18-2020 >>>> 07-22-2020   Subjective: Sitting up in bed.  Still with complaints of right upper quadrant abdominal pain with no significant change since admission.  Had significant urine output with Lasix yesterday.   Objective: Vitals:   07/26/20 0521 07/26/20 1551 07/26/20 2151 07/27/20 0452  BP: 116/62 122/90 130/76 133/76  Pulse: 82 82 82 73  Resp: 18 14 16 18   Temp: 98.6 F (37 C) 98.3 F (36.8 C) 97.9 F (36.6 C) 98.1 F (36.7 C)  TempSrc:     Oral  SpO2: 98% 98% 97% 99%  Weight:    82.9 kg  Height:        Intake/Output Summary (Last 24 hours) at 07/27/2020 1125 Last data filed at 07/26/2020 1918 Gross per 24 hour  Intake 480 ml  Output 2000 ml  Net -1520 ml   Filed Weights   07/17/20 2016 07/27/20 0452  Weight: 63.5 kg 82.9 kg    Examination:  General exam: Jaundiced. Respiratory system: Some bibasilar crackles.  No wheezing.  Speaking in full sentences.  Normal respiratory effort.  Cardiovascular system: Regular rate rhythm no murmurs rubs or gallops.  No JVD.  2-3+ bilateral lower extremity edema. Gastrointestinal system: Abdomen is soft, nondistended, some tenderness to palpation right upper quadrant.  No rebound.  No guarding.  Positive bowel sounds.  Central nervous system: Alert and oriented. No focal neurological deficits. Extremities: Symmetric 5 x 5 power.  2-3+ bilateral lower extremity edema.   Skin: No rashes, lesions or ulcers Psychiatry: Judgement and insight appear normal. Mood & affect appropriate.     Data Reviewed: I have personally reviewed following labs and imaging studies  CBC: Recent Labs  Lab 07/21/20 0418 07/22/20 0430 07/23/20 0504 07/26/20 0405 07/27/20 0355  WBC 5.3 3.8* 3.8* 5.5 5.1  NEUTROABS 3.5 2.3 2.3 2.9  --   HGB 8.8* 8.1* 8.4* 9.5* 9.0*  HCT 27.3* 24.8* 26.2* 28.6* 27.4*  MCV 124.1* 124.6* 127.8* 119.2* 117.6*  PLT 164 170 169 169 811    Basic Metabolic Panel: Recent Labs  Lab 07/22/20 0430 07/23/20 0504 07/24/20 0454 07/25/20 0506 07/26/20 0405 07/27/20 0355  NA 136 134* 130* 130* 132* 130*  K 2.9* 3.8 3.3* 3.7 3.3* 2.8*  CL 105 105 99 95* 95* 93*  CO2 19* 19* 21* 23 23 25   GLUCOSE 100* 83 88 88 73 92  BUN <5* <5* <5* <5* <5* <5*  CREATININE 0.46 <0.30* 0.42* 0.65 0.50 0.62  CALCIUM 7.3* 7.5* 8.0* 8.7* 8.7* 8.3*  MG 1.6* 1.8  --  1.6* 1.8 2.1    GFR: Estimated Creatinine Clearance: 83.1 mL/min (by C-G formula based on SCr of 0.62 mg/dL).  Liver  Function Tests: Recent Labs  Lab 07/23/20 0504 07/24/20 0454 07/25/20 0506 07/26/20 0405 07/27/20 0355  AST 122* 115* 141* 157* 164*  ALT 54* 54* 59* 55* 53*  ALKPHOS 91 104 116 108 104  BILITOT 12.8* 13.2* 16.8* 17.4* 18.0*  PROT 5.0* 5.1* 6.0* 5.6* 5.4*  ALBUMIN 2.2* 2.3* 2.6* 2.4* 2.3*    CBG: Recent Labs  Lab 07/22/20 2131  GLUCAP 94     Recent Results (from the past 240 hour(s))  Culture, Urine     Status: None   Collection Time: 07/17/20  9:00 PM   Specimen: Urine, Random  Result Value Ref Range Status   Specimen Description   Final    URINE, RANDOM Performed at [facility], [place] [address] [place], [place] [address]    Special Requests   Final    NONE Performed at [facility], [place] [address] [place], [place] [address]    Culture   Final    NO GROWTH Performed at [facility], [place] [address] [place], [place] [address]    Report Status 07/19/2020 FINAL  Final  Resp Panel by RT-PCR (Flu A&B, Covid) Nasopharyngeal Swab     Status: None   Collection Time: 07/18/20 12:36 AM   Specimen: Nasopharyngeal Swab; Nasopharyngeal(NP) swabs in vial transport medium  Result Value Ref Range Status   SARS Coronavirus 2 by RT PCR NEGATIVE NEGATIVE Final    Comment: (NOTE) SARS-CoV-2 target nucleic acids are NOT DETECTED.  The SARS-CoV-2 RNA is generally detectable in upper respiratory specimens during the acute phase of infection. The lowest concentration of SARS-CoV-2 viral copies this assay can detect is 138 copies/mL. A negative result does not preclude SARS-Cov-2 infection and should not be used as the sole basis for treatment or other patient management decisions. A negative result may occur with  improper specimen collection/handling, submission of specimen other than nasopharyngeal swab, presence of viral mutation(s) within the areas targeted by this assay, and inadequate number of viral copies(<138 copies/mL). A  negative result must be combined with clinical observations, patient history, and epidemiological information. The expected result is Negative.  Fact Sheet for Patients:  EntrepreneurPulse.com.au  Fact Sheet for Healthcare Providers:  IncredibleEmployment.be  This test is no t yet approved or cleared by the [place] FDA and  has been authorized for detection and/or diagnosis of SARS-CoV-2 by FDA under an Emergency Use Authorization (EUA). This EUA will remain  in effect (meaning this test can be used) for the duration of the COVID-19 declaration under Section 564(b)(1) of the Act, 21 U.S.C.section 360bbb-3(b)(1), unless the authorization is terminated  or revoked sooner.       Influenza A by  PCR NEGATIVE NEGATIVE Final   Influenza B by PCR NEGATIVE NEGATIVE Final    Comment: (NOTE) The Xpert Xpress SARS-CoV-2/FLU/RSV plus assay is intended as an aid in the diagnosis of influenza from Nasopharyngeal swab specimens and should not be used as a sole basis for treatment. Nasal washings and aspirates are unacceptable for Xpert Xpress SARS-CoV-2/FLU/RSV testing.  Fact Sheet for Patients: EntrepreneurPulse.com.au  Fact Sheet for Healthcare Providers: IncredibleEmployment.be  This test is not yet approved or cleared by the [place] FDA and has been authorized for detection and/or diagnosis of SARS-CoV-2 by FDA under an Emergency Use Authorization (EUA). This EUA will remain in effect (meaning this test can be used) for the duration of the COVID-19 declaration under Section 564(b)(1) of the Act, 21 U.S.C. section 360bbb-3(b)(1), unless the authorization is terminated or revoked.  Performed at [facility], [place] [address], [place], [place] [address]          Radiology Studies: DG Wrist 2 Views Right  Result Date: 07/26/2020 CLINICAL DATA:  [place] on outstretched hand. Right  wrist pain. Initial encounter. EXAM: RIGHT WRIST - 2 VIEW COMPARISON:  None. FINDINGS: Nondisplaced fracture is seen involving distal radial metaphysis. No other acute fractures identified. No evidence of dislocation. Internal fixation screws noted in the 5th metacarpal shaft. IMPRESSION: Nondisplaced distal radial metaphyseal fracture. Electronically Signed   By: Marlaine Hind M.D.   On: 07/26/2020 05:59        Scheduled Meds: . amLODipine  5 mg Oral Daily  . feeding supplement  237 mL Oral Q24H  . folic acid  1 mg Oral Daily  . levothyroxine  75 mcg Oral Q0600  . polyethylene glycol  17 g Oral Daily  . potassium chloride  40 mEq Oral Q4H  . senna  1 tablet Oral BID  . thiamine  100 mg Oral Daily  . venlafaxine XR  75 mg Oral Daily   Continuous Infusions:    LOS: 9 days    Time spent: 35 minutes    Irine Seal, MD [facility]   To contact the attending provider between 7A-7P or the covering provider during after hours 7P-7A, please log into the web site [email or web address] and access using universal [place] password for that web site. If you do not have the password, please call the hospital operator.  07/27/2020, 11:25 AM

## 2020-07-28 LAB — COMPREHENSIVE METABOLIC PANEL
ALT: 15 U/L (ref 0–44)
ALT: 48 U/L — ABNORMAL HIGH (ref 0–44)
AST: 22 U/L (ref 15–41)
AST: 173 U/L — ABNORMAL HIGH (ref 15–41)
Albumin: 2.3 g/dL — ABNORMAL LOW (ref 3.5–5.0)
Albumin: 2.2 g/dL — ABNORMAL LOW (ref 3.5–5.0)
Alkaline Phosphatase: 50 U/L (ref 38–126)
Alkaline Phosphatase: 127 U/L — ABNORMAL HIGH (ref 38–126)
Anion gap: 12 (ref 5–15)
Anion gap: 12 (ref 5–15)
BUN: 50 mg/dL — ABNORMAL HIGH (ref 6–20)
BUN: <5 mg/dL — ABNORMAL LOW (ref 6–20)
CO2: 21 mmol/L — ABNORMAL LOW (ref 22–32)
CO2: 27 mmol/L (ref 22–32)
Calcium: 9.1 mg/dL (ref 8.9–10.3)
Calcium: 8.4 mg/dL — ABNORMAL LOW (ref 8.9–10.3)
Chloride: 103 mmol/L (ref 98–111)
Chloride: 93 mmol/L — ABNORMAL LOW (ref 98–111)
Creatinine, Ser: 1.69 mg/dL — ABNORMAL HIGH (ref 0.44–1.00)
Creatinine, Ser: 0.62 mg/dL (ref 0.44–1.00)
GFR, Estimated: 35 mL/min — ABNORMAL LOW (ref >60)
GFR, Estimated: >60 mL/min (ref >60)
Glucose, Bld: 216 mg/dL — ABNORMAL HIGH (ref 70–99)
Glucose, Bld: 122 mg/dL — ABNORMAL HIGH (ref 70–99)
Potassium: 4.6 mmol/L (ref 3.5–5.1)
Potassium: 2.7 mmol/L — CL (ref 3.5–5.1)
Sodium: 136 mmol/L (ref 135–145)
Sodium: 132 mmol/L — ABNORMAL LOW (ref 135–145)
Total Bilirubin: 0.5 mg/dL (ref 0.3–1.2)
Total Bilirubin: 18 mg/dL — ABNORMAL HIGH (ref 0.3–1.2)
Total Protein: 5.5 g/dL — ABNORMAL LOW (ref 6.5–8.1)
Total Protein: 5.5 g/dL — ABNORMAL LOW (ref 6.5–8.1)

## 2020-07-28 LAB — CBC WITH DIFFERENTIAL/PLATELET
Abs Immature Granulocytes: 2.2 10*3/uL — ABNORMAL HIGH (ref 0.00–0.07)
Abs Immature Granulocytes: 0.05 10*3/uL (ref 0.00–0.07)
Band Neutrophils: 21 %
Basophils Absolute: 0 10*3/uL (ref 0.0–0.1)
Basophils Absolute: 0 10*3/uL (ref 0.0–0.1)
Basophils Relative: 0 %
Basophils Relative: 0 %
Eosinophils Absolute: 0 10*3/uL (ref 0.0–0.5)
Eosinophils Absolute: 0 10*3/uL (ref 0.0–0.5)
Eosinophils Relative: 0 %
Eosinophils Relative: 0 %
HCT: 29 % — ABNORMAL LOW (ref 36.0–46.0)
HCT: 26.6 % — ABNORMAL LOW (ref 36.0–46.0)
Hemoglobin: 8.9 g/dL — ABNORMAL LOW (ref 12.0–15.0)
Hemoglobin: 8.7 g/dL — ABNORMAL LOW (ref 12.0–15.0)
Immature Granulocytes: 1 %
Lymphocytes Relative: 5 %
Lymphocytes Relative: 23 %
Lymphs Abs: 1.4 10*3/uL (ref 0.7–4.0)
Lymphs Abs: 1.4 10*3/uL (ref 0.7–4.0)
MCH: 28.3 pg (ref 26.0–34.0)
MCH: 38.5 pg — ABNORMAL HIGH (ref 26.0–34.0)
MCHC: 30.7 g/dL (ref 30.0–36.0)
MCHC: 32.7 g/dL (ref 30.0–36.0)
MCV: 92.1 fL (ref 80.0–100.0)
MCV: 117.7 fL — ABNORMAL HIGH (ref 80.0–100.0)
Metamyelocytes Relative: 1 %
Monocytes Absolute: 0.3 10*3/uL (ref 0.1–1.0)
Monocytes Absolute: 0.9 10*3/uL (ref 0.1–1.0)
Monocytes Relative: 1 %
Monocytes Relative: 15 %
Myelocytes: 7 %
Neutro Abs: 23.5 10*3/uL — ABNORMAL HIGH (ref 1.7–7.7)
Neutro Abs: 3.6 10*3/uL (ref 1.7–7.7)
Neutrophils Relative %: 65 %
Neutrophils Relative %: 61 %
Platelets: 133 10*3/uL — ABNORMAL LOW (ref 150–400)
Platelets: 217 10*3/uL (ref 150–400)
RBC: 3.15 MIL/uL — ABNORMAL LOW (ref 3.87–5.11)
RBC: 2.26 MIL/uL — ABNORMAL LOW (ref 3.87–5.11)
RDW: 18.4 % — ABNORMAL HIGH (ref 11.5–15.5)
RDW: 17.1 % — ABNORMAL HIGH (ref 11.5–15.5)
WBC Morphology: INCREASED
WBC: 27.3 10*3/uL — ABNORMAL HIGH (ref 4.0–10.5)
WBC: 5.9 10*3/uL (ref 4.0–10.5)
nRBC: 1.6 % — ABNORMAL HIGH (ref 0.0–0.2)
nRBC: 0 % (ref 0.0–0.2)

## 2020-07-28 LAB — MAGNESIUM: Magnesium: 1.7 mg/dL (ref 1.7–2.4)

## 2020-07-28 MED ORDER — POTASSIUM CHLORIDE CRYS ER 20 MEQ PO TBCR
40.0000 meq | EXTENDED_RELEASE_TABLET | ORAL | Status: AC
Start: 2020-07-28 — End: 2020-07-28
  Administered 2020-07-28 (×2): 40 meq via ORAL
  Filled 2020-07-28 (×2): qty 2

## 2020-07-28 MED ORDER — PREDNISONE 20 MG PO TABS
20.0000 mg | ORAL_TABLET | Freq: Every day | ORAL | Status: DC
  Administered 2020-07-28 – 2020-08-02 (×6): 20 mg via ORAL
  Filled 2020-07-28 (×6): qty 1

## 2020-07-28 MED ORDER — MAGNESIUM SULFATE 4 GM/100ML IV SOLN
4.0000 g | Freq: Once | INTRAVENOUS | Status: AC
  Administered 2020-07-28: 4 g via INTRAVENOUS
  Filled 2020-07-28: qty 100

## 2020-07-28 MED ORDER — URSODIOL 300 MG PO CAPS
600.0000 mg | ORAL_CAPSULE | Freq: Two times a day (BID) | ORAL | Status: DC
  Administered 2020-07-28 – 2020-08-06 (×18): 600 mg via ORAL
  Filled 2020-07-28 (×19): qty 2

## 2020-07-28 NOTE — Care Management Important Message (Signed)
Important Message  Patient Details IM Letter given to the Patient Name: Judy Meyer MRN: [id number] Date of Birth: 06-Apr-1961   Medicare Important Message Given:  Yes     Kerin Salen 07/28/2020, 1:50 PM

## 2020-07-28 NOTE — Progress Notes (Signed)
PROGRESS NOTE    [name]  BZJ:[id number] DOB: 1960-11-08 DOA: 07/17/2020 PCP: Pcp, No    Chief Complaint  Patient presents with   Vomiting   Abdominal Pain    Brief Narrative:  59 yo female with PMH hypothyroidism, hypertension, uterine fibroids, hyperlipidemia, depression who presented to the ER with worsening nausea and vomiting with jaundice, decreased strength, weight loss. On work-up she was found to have multiple lab abnormalities. Also underwent ultrasound abdomen which showed gallbladder stones and sludge. LFTs were elevated and she was also found to have iron excess.  GI was consulted on admission as well.General surgery also consulted and felt no need for surgical intervention at this time. Patient underwent ultrasound-guided liver biopsy which was consistent with severe hepatic steatosis and intrahepatic cholestasis. Bilirubin continues to rise. GI following.   Assessment & Plan:   Principal Problem:   Abnormal liver function Active Problems:   Hypokalemia   Hypothyroidism   Hyperlipidemia   HTN (hypertension)   Acute lower UTI   Macrocytic anemia   Iron excess   Malnutrition of moderate degree   Rhabdomyolysis   Jaundice   Transaminitis   Hypomagnesemia   Swelling of lower extremity   Folate deficiency  1 transaminitis/hyperbilirubinemia/right upper quadrant abdominal pain Questionable etiology. Right upper quadrant ultrasound done showed cholelithiasis and sludge but no evidence of acute cholecystitis. MRCP which was done was negative for any acute findings without evidence of gallstones, obstructive lesions or biliary dilatation. HIDA scan was unable to be done as total bilirubin noted to be elevated greater than 4.5 as per radiology. Patient still with right upper quadrant abdominal pain. Total bilirubin still trending upwards repeat labs today with bilirubin at 18.0.  Patient jaundice with scleral icterus. Patient underwent liver biopsy which was  consistent with severe hepatic steatosis with intrahepatic cholestasis with no clear explanation.  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  Acute hepatitis panel which was done was negative. Patient noted to be on Nortrel for fibroids which she has taken for several years for dysfunctional uterine bleeding. Not sure not resumed during this hospitalization however per GI patient noted to have been taking her own supply in the room. Patient with no significant improvement with hyperbilirubinemia, right upper quadrant abdominal pain. GI following and recommending transfer to a tertiary care center for evaluation by a hepatologist.  Tried calling [facility] and transfer has been denied per [facility]  Gastroenterology have called [facility] transfer center for consideration for transfer.  GI, Dr. Alessandra Bevels spoke with Dr. Merrilee Jansky at [facility] hepatology who recommended evaluation for drug-induced liver injury and recommended starting prednisone 20 mg daily along with ursodiol 15 mg/kg.  Orders placed per GI.  GI following and appreciate input and recommendation.  2. Rhabdomyolysis CK on admission at 5731 with normal renal function. Patient with volume overload on examination as such IV fluids discontinued.  CK levels trending down to 623.   3. Bilateral lower extremity edema Secondary to aggressive fluid resuscitation also secondary to hypoalbuminemia. Patient noted to be +10 L during this hospitalization. Patient has been receiving IV Lasix over the past 2 to 3 days.  Patient with a urine output of 5.6 L over the past 24 hours.  ??  IV albumin however will hold off for now.  Strict I's and O's.  Daily weights.  Follow  4. Iron excess Patient noted to have elevated 191, elevated set ratio of 88%, elevated ferritin. No chronic transfusions. Follow-up GI  work-up, testing for hemochromatosis pending. Follow.  5. Hypothyroidism Continue Synthroid.  6.. Bacteria in  urine Urine cultures negative.  Status post IV Rocephin.  Antibiotics discontinued.  7. Macrocytic anemia/folate deficiency B12 normal. Folate low. Continue folic acid daily.  8. Hypertension Norvasc.   9. Hyperlipidemia Continue to hold statin secondary to transaminitis.   10. Hypokalemia/hypomagnesemia Hypokalemia likely secondary to diuresis.  Potassium at 4.6 this morning however likely an error with repeat labs with a potassium of 2.7.  Magnesium of 1.7.  K. Dur 40 mEq p.o. every 4 hours x2 doses.  Magnesium sulfate 4 g IV x1.  Follow.   11.  Nondisplaced distal radial metaphyseal fracture Secondary to mechanical fall.  Discussed with orthopedics who recommended Velcro wrist splint with outpatient follow-up.  Continue current wrist splint.  12.  History of dysfunctional uterine bleeding/fibroids Patient noted to be on Notrel and stated last time this was discontinued during the hospitalization she had significant uterine bleeding.  Concern notrel may in part be contributing to her worsening transaminitis.  [facility] who feel that there is no medical therapy at this time to offer patient has not hormonally related and discussed with GI who would like to hold off on any additional medications at this time.  Will need outpatient follow-up with OB/GYN.     DVT prophylaxis: SCDs Code Status: Full Family Communication: Updated patient.  No family at bedside.   Disposition:   Status is: Inpatient    Dispo: The patient is from: Home              Anticipated d/c is to: Home              Anticipated d/c date is: To be determined.              Patient currently jaundice with hyperbilirubinemia unknown etiology with some right upper quadrant abdominal pain.  Awaiting transfer to a tertiary care center.  Not stable for discharge.       Consultants:   General surgery: Dr. Barry Dienes 07-25-2020  Interventional radiology: Dr. Earleen Newport 07-22-2020  Gastroenterology: Dr. Therisa Doyne  07-18-2020  [facility]/GYN: Dr.Ervin 07/28/2020  Procedures:   CT abdomen and pelvis 07-17-2020  Plain films of the right wrist 07-26-2020  MRCP 07-21-2020  Right upper quadrant ultrasound 07-18-2020  Ultrasound-guided liver biopsy 07-22-2020 per Dr. Earleen Newport, IR  Antimicrobials:  IV Rocephin 07-18-2020 >>>> 07-22-2020   Subjective: Sitting up in bed.  Jaundice.  No significant change with her abdominal pain.  Good urine output.   Objective: Vitals:   07/27/20 0452 07/27/20 1537 07/27/20 2124 07/28/20 0508  BP: 133/76 (!) 131/110 128/76 130/72  Pulse: 73 86 83 80  Resp: 18 12 18 18   Temp: 98.1 F (36.7 C) 98 F (36.7 C) 98.7 F (37.1 C) 98.9 F (37.2 C)  TempSrc: Oral Oral Oral   SpO2: 99% 96% 98% 97%  Weight: 82.9 kg   81.2 kg  Height:        Intake/Output Summary (Last 24 hours) at 07/28/2020 1212 Last data filed at 07/28/2020 0500 Gross per 24 hour  Intake --  Output 4900 ml  Net -4900 ml   Filed Weights   07/17/20 2016 07/27/20 0452 07/28/20 0508  Weight: 63.5 kg 82.9 kg 81.2 kg    Examination:  General exam: Jaundiced. Respiratory system: Decreasing bibasilar crackles.  No wheezing.  Speaking in full sentences.  Normal respiratory effort.  Cardiovascular system: RRR no murmurs rubs or gallops.  No JVD.  2+ bilateral lower extremity edema.  Gastrointestinal system: Abdomen is soft, nondistended, some tenderness to palpation right upper quadrant and lower abdomen.  No rebound.  No guarding.  Positive bowel sounds.  Central nervous system: Alert and oriented. No focal neurological deficits. Extremities: Symmetric 5 x 5 power.  2+ bilateral lower extremity edema. Skin: No rashes, lesions or ulcers Psychiatry: Judgement and insight appear normal. Mood & affect appropriate.     Data Reviewed: I have personally reviewed following labs and imaging studies  CBC: Recent Labs  Lab 07/22/20 0430 07/23/20 0504 07/26/20 0405 07/27/20 0355 07/28/20 0428   WBC 3.8* 3.8* 5.5 5.1 27.3*  NEUTROABS 2.3 2.3 2.9  --  23.5*  HGB 8.1* 8.4* 9.5* 9.0* 8.9*  HCT 24.8* 26.2* 28.6* 27.4* 29.0*  MCV 124.6* 127.8* 119.2* 117.6* 92.1  PLT 170 169 169 209 133*    Basic Metabolic Panel: Recent Labs  Lab 07/22/20 0430 07/23/20 0504 07/24/20 0454 07/25/20 0506 07/26/20 0405 07/27/20 0355 07/28/20 0428  NA 136 134* 130* 130* 132* 130* 136  K 2.9* 3.8 3.3* 3.7 3.3* 2.8* 4.6  CL 105 105 99 95* 95* 93* 103  CO2 19* 19* 21* 23 23 25  21*  GLUCOSE 100* 83 88 88 73 92 216*  BUN <5* <5* <5* <5* <5* <5* 50*  CREATININE 0.46 <0.30* 0.42* 0.65 0.50 0.62 1.69*  CALCIUM 7.3* 7.5* 8.0* 8.7* 8.7* 8.3* 9.1  MG 1.6* 1.8  --  1.6* 1.8 2.1  --     GFR: Estimated Creatinine Clearance: 39 mL/min (A) (by C-G formula based on SCr of 1.69 mg/dL (H)).  Liver Function Tests: Recent Labs  Lab 07/24/20 0454 07/25/20 0506 07/26/20 0405 07/27/20 0355 07/28/20 0428  AST 115* 141* 157* 164* 22  ALT 54* 59* 55* 53* 15  ALKPHOS 104 116 108 104 50  BILITOT 13.2* 16.8* 17.4* 18.0* 0.5  PROT 5.1* 6.0* 5.6* 5.4* 5.5*  ALBUMIN 2.3* 2.6* 2.4* 2.3* 2.3*    CBG: Recent Labs  Lab 07/22/20 2131  GLUCAP 94     No results found for this or any previous visit (from the past 240 hour(s)).       Radiology Studies: No results found.      Scheduled Meds:  amLODipine  5 mg Oral Daily   feeding supplement  237 mL Oral W62M   folic acid  1 mg Oral Daily   levothyroxine  75 mcg Oral Q0600   polyethylene glycol  17 g Oral Daily   senna  1 tablet Oral BID   thiamine  100 mg Oral Daily   venlafaxine XR  75 mg Oral Daily   Continuous Infusions:    LOS: 10 days    Time spent: 35 minutes    Irine Seal, MD [facility]   To contact the attending provider between 7A-7P or the covering provider during after hours 7P-7A, please log into the web site [email or web address] and access using universal [place] password for that web site. If you do  not have the password, please call the hospital operator.  07/28/2020, 12:12 PM

## 2020-07-28 NOTE — Progress Notes (Signed)
Patient ID: Judy Meyer, female   DOB: 1960/12/16, 59 y.o.   MRN: [id number] GYN Consult.  Spoke with Dr Grandville Silos and Dr Alessandra Bevels in regards to this pt. Pt had been taking her Notrel from home. She has been informed to discontinue taking. Unfortunately do not have any medical therapy to offer pt that is not hormonally related.  GI would like to hold on any additional medications at this time as they have started a new treatment regiment for pt.

## 2020-07-28 NOTE — Progress Notes (Signed)
-  Case discussed with Dr. Laurier Nancy at [facility] hepatology.  Recommended evaluate for drug-induced liver injury.  Also recommended to start prednisone 20 mg daily along with ursodiol 15 mg/kg.  -Orders placed for prednisone 20 mg once a day and ursodiol 600 mg twice a day.  Discussed with Dr. Grandville Silos.  Otis Brace MD, [place] 07/28/2020, 3:27 PM  Contact #  [phone]

## 2020-07-28 NOTE — Progress Notes (Signed)
Occupational Therapy Treatment Patient Details Name: Judy Meyer MRN: [id number] DOB: 1961-07-20 Today's Date: 07/28/2020    History of present illness 59 y.o. female with medical history significant of fibroids, HTN, HLD, depression and admitted for nausea and abdominal pain, found to have Hyperbilirubinemia/ elevated LFT's. Pt reports fall in hospital while trying to wipe up the floor and RT wrist X-ray on 07/26/20 revealed Nondisplaced distal radial metaphyseal fracture and RT wrist splint ordered.   OT comments  Patient showing some regression compared to previous session, with complication of recent fall and x-raay on 12/18 revealing wrist fracture as above.  Pt cued to restrict RUE WB until clarification from MD is available. Patient additionally limited by impaired activity tolerance, LE edema and  LE weakness with pt tolerating ~75' with RW (cues to just rest RUE on walker and not WB) along with deficits noted below. Pt continues to demonstrate good rehab potential and would benefit from continued skilled OT to increase safety and independence with ADLs and functional transfers to allow pt to return home safely and reduce caregiver burden and fall risk.   Follow Up Recommendations  CIR;Other (comment) (Pt reports that she is working with her doctors for IRF/CIR at [facility] or [facility].)    Equipment Recommendations  Tub/shower seat    Recommendations for [employer] [facility] consult    Precautions / Restrictions Precautions Precautions: Fall Precaution Comments: orthostatic Required Braces or Orthoses: Splint/Cast Splint/Cast: RT wrist Splint/Cast - Date Prophylactic Dressing Applied (if applicable): 32/99/24 Restrictions Weight Bearing Restrictions: Yes RUE Weight Bearing:  (Unknown at this time.  Have messaged MD requesting WB status for new RT wrist fracture.)       Mobility Bed Mobility Overal bed mobility: Modified Independent Bed Mobility: Sit to Supine;Supine to Sit      Supine to sit: Modified independent (Device/Increase time) Sit to supine: Modified independent (Device/Increase time)   General bed mobility comments: Using bed adjustments, increased time.  Transfers Overall transfer level: Needs assistance   Transfers: Sit to/from Stand Sit to Stand: Min guard;Supervision Stand pivot transfers: Min guard       General transfer comment: Pt performed Stand from EOB with supervision, pushing with LUE only. Pt pivoted to recliner without AD with Min guard. Pt performed 30 Second Sit to Stand test with score of 4, indicating a very high fall risk. Test required modification to use LUE to push to power up, as pt was unable to completed a stand without use of UEs.  Assistance varied from [place] guard to supervision during test.  Pt ambulated in hallway with RW and cues to just rest RUE on walker and not push through. Pt tolerated ~75' with Min guard assist.    Balance   Sitting-balance support: No upper extremity supported;Feet supported Sitting balance-Leahy Scale: Good     Standing balance support: No upper extremity supported Standing balance-Leahy Scale: Fair Standing balance comment: Static fair/dynamic with need of RW and therefore poor.                           ADL either performed or assessed with clinical judgement   ADL   Eating/Feeding: Modified independent;Bed level                                     General ADL Comments: Pt reports that all of her ADLs have been completed this  morning and requests to ambulate in hallway.  Please refer to Mobility section and balance section.  Pt was provided with handout and education on energy conservation techniques.     Vision   Vision Assessment?: No apparent visual deficits   Perception     Praxis      Cognition Arousal/Alertness: Awake/alert Behavior During Therapy: WFL for tasks assessed/performed Overall Cognitive Status: Within Functional Limits for tasks  assessed                                 General Comments: Pleasant and cooperative.        Exercises Other Exercises Other Exercises: Pt shown gentle RT hand exercises to perform with finger to thumb opposition, finger flex/extension (no squeezing/strengthening), elbow flex/ext and shoulder flex/ext to avoid RUE guarding and stiffness. Pt able to demonstrate exercises back.   Shoulder Instructions       General Comments      Pertinent Vitals/ Pain       Pain Assessment: No/denies pain  Home Living                                          Prior Functioning/Environment              Frequency  Min 2X/week        Progress Toward Goals  OT Goals(current goals can now be found in the care plan section)  Progress towards OT goals: Not progressing toward goals - comment (see Assessment portion of note)  Acute Rehab OT Goals Patient Stated Goal: LE strengthening OT Goal Formulation: With patient Potential to Achieve Goals: Good  Plan Discharge plan needs to be updated    Co-evaluation                 AM-PAC OT "6 Clicks" Daily Activity     Outcome Measure   Help from another person eating meals?: None Help from another person taking care of personal grooming?: A Little Help from another person toileting, which includes using toliet, bedpan, or urinal?: A Little Help from another person bathing (including washing, rinsing, drying)?: A Little Help from another person to put on and taking off regular upper body clothing?: A Little Help from another person to put on and taking off regular lower body clothing?: A Little 6 Click Score: 19    End of Session Equipment Utilized During Treatment: Rolling walker;Gait belt  OT Visit Diagnosis: Other abnormalities of gait and mobility (R26.89);History of falling (Z91.81);Muscle weakness (generalized) (M62.81) (Recent fall with injury)   Activity Tolerance Patient tolerated treatment  well;Patient limited by fatigue   Patient Left in bed;with call bell/phone within reach;with bed alarm set   Nurse Communication          Time: 1336-1400 OT Time Calculation (min): 24 min  Charges: OT General Charges $OT Visit: 1 Visit OT Treatments $Self Care/Home Management : 8-22 mins $Therapeutic Activity: 8-22 mins  Anderson [place], [place] Office: [phone] 07/28/2020   Julien Girt 07/28/2020, 2:23 PM

## 2020-07-28 NOTE — Progress Notes (Signed)
PT Cancellation Note  Patient Details Name: Judy Meyer MRN: [id number] DOB: Oct 24, 1960   Cancelled Treatment:    Reason Eval/Treat Not Completed: Patient at procedure or test/unavailable Working with OT   York Ram E 07/28/2020, 2:58 PM Arlyce Dice, DPT Acute Rehabilitation Services Pager: [phone] Office: [phone]

## 2020-07-28 NOTE — Progress Notes (Signed)
[facility] Progress Note  Judy Meyer 59 y.o. [birth year]  CC:  Transaminitis, RUQ abdominal pain   Subjective: Patient reports continued nausea, had one episode of vomiting yesterday prior to breakfast but has been able to tolerate diet.  Has been having BMs on bowel regimen, reports two BMs today without melena or hematochezia.  Continues to have RUQ abdominal pain.  Continues to have some lower extremity weakness, has been working with PT.  ROS : Review of Systems  Cardiovascular: Negative for chest pain and palpitations.  Gastrointestinal: Positive for abdominal pain, constipation and nausea. Negative for blood in stool, diarrhea, heartburn, melena and vomiting.  Neurological: Positive for weakness. Negative for loss of consciousness.    Objective: Vital signs in last 24 hours: Vitals:   07/27/20 2124 07/28/20 0508  BP: 128/76 130/72  Pulse: 83 80  Resp: 18 18  Temp: 98.7 F (37.1 C) 98.9 F (37.2 C)  SpO2: 98% 97%    Physical Exam:  General:  Alert, oriented, cooperative, no distress  Head:  Normocephalic, without obvious abnormality, atraumatic  Eyes:  Deep icterus, EOMs intact  Lungs:   Clear to auscultation bilaterally, respirations unlabored  Heart:  Regular rate and rhythm, S1, S2 normal  Abdomen:   Soft with mild RUQ tenderness to palpation, bowel sounds active all four quadrants,  no peritoneal signs  Extremities: No edema present  Pulses: Radial pulses 2+ and symmetric    Lab Results: Recent Labs    07/26/20 0405 07/27/20 0355 07/28/20 0428  NA 132* 130* 136  K 3.3* 2.8* 4.6  CL 95* 93* 103  CO2 23 25 21*  GLUCOSE 73 92 216*  BUN <5* <5* 50*  CREATININE 0.50 0.62 1.69*  CALCIUM 8.7* 8.3* 9.1  MG 1.8 2.1  --    Recent Labs    07/27/20 0355 07/28/20 0428  AST 164* 22  ALT 53* 15  ALKPHOS 104 50  BILITOT 18.0* 0.5  PROT 5.4* 5.5*  ALBUMIN 2.3* 2.3*   Recent Labs    07/26/20 0405 07/27/20 0355 07/28/20 0428  WBC 5.5 5.1  27.3*  NEUTROABS 2.9  --  23.5*  HGB 9.5* 9.0* 8.9*  HCT 28.6* 27.4* 29.0*  MCV 119.2* 117.6* 92.1  PLT 169 209 133*   Recent Labs    07/26/20 0405  LABPROT 14.4  INR 1.2    Assessment: Abnormal LFTs: 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 Bili continues to rise, 18.0 yesterday with  AST 164/ALT 53, normal ALP (104) -CMP at 04:28 today does not appear accurate, given recent trend. Repeat CMP. -PT/INR normal as of 07/26/20 -Elevated iron saturation (88%), iron (191), decreased TIBC (216), and elevated ferritin (802).  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  RUQ [place] 12/10: Layering gallbladder sludge/stones. No definite evidence of acute Cholecystitis. Normal CBD (27mm).  MRCP did not reveal any source of pain.  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  Recommend transfer to tertiary care center [facility] or [facility]) when possible.  We will consider addition of prednisone for possible drug-induced liver injury.  [facility] will follow.  Salley Slaughter PA-C 07/28/2020, 9:41 AM  Contact #  [phone]

## 2020-07-29 LAB — COMPREHENSIVE METABOLIC PANEL
ALT: 57 U/L — ABNORMAL HIGH (ref 0–44)
AST: 196 U/L — ABNORMAL HIGH (ref 15–41)
Albumin: 2.5 g/dL — ABNORMAL LOW (ref 3.5–5.0)
Alkaline Phosphatase: 141 U/L — ABNORMAL HIGH (ref 38–126)
Anion gap: 12 (ref 5–15)
BUN: <5 mg/dL — ABNORMAL LOW (ref 6–20)
CO2: 26 mmol/L (ref 22–32)
Calcium: 9 mg/dL (ref 8.9–10.3)
Chloride: 95 mmol/L — ABNORMAL LOW (ref 98–111)
Creatinine, Ser: 0.48 mg/dL (ref 0.44–1.00)
GFR, Estimated: >60 mL/min (ref >60)
Glucose, Bld: 111 mg/dL — ABNORMAL HIGH (ref 70–99)
Potassium: 3.5 mmol/L (ref 3.5–5.1)
Sodium: 133 mmol/L — ABNORMAL LOW (ref 135–145)
Total Bilirubin: 21.2 mg/dL (ref 0.3–1.2)
Total Protein: 6.1 g/dL — ABNORMAL LOW (ref 6.5–8.1)

## 2020-07-29 LAB — MAGNESIUM: Magnesium: 2.6 mg/dL — ABNORMAL HIGH (ref 1.7–2.4)

## 2020-07-29 MED ORDER — POTASSIUM CHLORIDE CRYS ER 20 MEQ PO TBCR
40.0000 meq | EXTENDED_RELEASE_TABLET | Freq: Once | ORAL | Status: AC
  Administered 2020-07-29: 40 meq via ORAL
  Filled 2020-07-29: qty 2

## 2020-07-29 NOTE — Progress Notes (Signed)
[facility] Progress Note  Judy Meyer 59 y.o. Dec 15, 1960  CC:  Transaminitis, RUQ abdominal pain  Subjective: Patient feels "about the same." Denies nausea or vomiting yesterday or today.  Denies melena or hematochezia.  Continues to have RUQ abdominal pain.  Continues to have some lower extremity weakness and pain.  ROS : Review of Systems  Cardiovascular: Negative for chest pain and palpitations.  Gastrointestinal: Positive for abdominal pain. Negative for blood in stool, constipation, diarrhea, heartburn, melena, nausea and vomiting.  Neurological: Positive for weakness. Negative for loss of consciousness.    Objective: Vital signs in last 24 hours: Vitals:   07/28/20 2110 07/29/20 0459  BP: 129/71 111/71  Pulse: 76 73  Resp: 18 18  Temp: 98.2 F (36.8 C) 98.1 F (36.7 C)  SpO2: 99% 97%    Physical Exam:  General:  Alert, oriented, cooperative, no distress  Head:  Normocephalic, without obvious abnormality, atraumatic  Eyes:  Deep icterus, EOMs intact  Lungs:   Clear to auscultation bilaterally, respirations unlabored  Heart:  Regular rate and rhythm, S1, S2 normal  Abdomen:   Soft with mild RUQ tenderness to palpation, bowel sounds active all four quadrants,  no peritoneal signs  Extremities: No edema present  Pulses: Radial pulses 2+ and symmetric    Lab Results: Recent Labs    07/28/20 1435 07/29/20 0415  NA 132* 133*  K 2.7* 3.5  CL 93* 95*  CO2 27 26  GLUCOSE 122* 111*  BUN <5* <5*  CREATININE 0.62 0.48  CALCIUM 8.4* 9.0  MG 1.7 2.6*   Recent Labs    07/28/20 1435 07/29/20 0415  AST 173* 196*  ALT 48* 57*  ALKPHOS 127* 141*  BILITOT 18.0* 21.2*  PROT 5.5* 6.1*  ALBUMIN 2.2* 2.5*   Recent Labs    07/28/20 0428 07/28/20 1435  WBC 27.3* 5.9  NEUTROABS 23.5* 3.6  HGB 8.9* 8.7*  HCT 29.0* 26.6*  MCV 92.1 117.7*  PLT 133* 217   No results for input(s): LABPROT, INR in the last 72 hours.  Assessment: Abnormal LFTs:  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 Bili continues to rise, 21.2 today with  AST 196/ALT 57, and ALP 141 -PT/INR normal as of 07/26/20 -Elevated iron saturation (88%), iron (191), decreased TIBC (216), and elevated ferritin (802).  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  RUQ [place] 12/10: Layering gallbladder sludge/stones. No definite evidence of acute Cholecystitis. Normal CBD (1mm).  MRCP did not reveal any source of pain.  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  Continue prednisone and ursodiol.  Continue to trend LFTs.  Recommend transfer to tertiary care center [facility] or [facility]) when possible.   [facility] will follow.  Salley Slaughter PA-C 07/29/2020, 9:47 AM  Contact #  [phone]

## 2020-07-29 NOTE — Progress Notes (Signed)
PROGRESS NOTE    Judy Meyer  [id number] DOB: Apr 18, 1961 DOA: 07/17/2020 PCP: Pcp, No    Chief Complaint  Patient presents with  . Vomiting  . Abdominal Pain    Brief Narrative:  59 yo female with PMH hypothyroidism, hypertension, uterine fibroids, hyperlipidemia, depression who presented to the ER with worsening nausea and vomiting with jaundice, decreased strength, weight loss. On work-up she was found to have multiple lab abnormalities. Also underwent ultrasound abdomen which showed gallbladder stones and sludge. LFTs were elevated and she was also found to have iron excess.  GI was consulted on admission as well.General surgery also consulted and felt no need for surgical intervention at this time. Patient underwent ultrasound-guided liver biopsy which was consistent with severe hepatic steatosis and intrahepatic cholestasis. Bilirubin continues to rise. GI following.   Assessment & Plan:   Principal Problem:   Abnormal liver function Active Problems:   Hypokalemia   Hypothyroidism   Hyperlipidemia   HTN (hypertension)   Acute lower UTI   Macrocytic anemia   Iron excess   Malnutrition of moderate degree   Rhabdomyolysis   Jaundice   Transaminitis   Hypomagnesemia   Swelling of lower extremity   Folate deficiency  1 transaminitis/hyperbilirubinemia/right upper quadrant abdominal pain Questionable etiology. Right upper quadrant ultrasound done showed cholelithiasis and sludge but no evidence of acute cholecystitis. MRCP which was done was negative for any acute findings without evidence of gallstones, obstructive lesions or biliary dilatation. HIDA scan was unable to be done as total bilirubin noted to be elevated greater than 4.5 as per radiology. Patient still with right upper quadrant abdominal pain. Total bilirubin still trending upwards repeat labs today with bilirubin at 18.0.  Patient jaundice with scleral icterus. Patient underwent liver biopsy which was  consistent with severe hepatic steatosis with intrahepatic cholestasis with no clear explanation.  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  Acute hepatitis panel which was done was negative. Patient noted to be on Nortrel for fibroids which she has taken for several years for dysfunctional uterine bleeding. Not sure not resumed during this hospitalization however per GI patient noted to have been taking her own supply in the room. Patient with no significant improvement with hyperbilirubinemia, right upper quadrant abdominal pain. GI following and recommending transfer to a tertiary care center for evaluation by a hepatologist.  [facility] and transfer has been denied per [facility] with no reason given.  Gastroenterology have called [facility] transfer center for consideration for transfer.  GI, Dr. Alessandra Bevels spoke with Dr. Merrilee Jansky at [facility] hepatology who recommended evaluation for drug-induced liver injury and recommended starting prednisone 20 mg daily along with ursodiol 15 mg/kg and monitoring over the next 48 hours and if no improvement may need transfer to [facility].  GI following and appreciate input and recommendation.  2. 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 Patient with volume overload on examination as such IV fluids discontinued.  CK levels trended down to 623.  Patient with complaints of lower extremity pain.  Potassium at 3.5.  Magnesium greater than 2.  Concern for possible critical illness myopathy.  Repeat CK levels in the morning.  3. Bilateral lower extremity edema Secondary to aggressive fluid resuscitation also secondary to hypoalbuminemia. Patient noted to be +10 L during this hospitalization. Patient has been receiving IV Lasix over the past 2 to 3 days.  Urine output of 600 cc over the past 24 hours.  Hold off on diuretics today.  Monitor urine output.  May need IV albumin with  Lasix.  Strict I's and O's.  Daily weights.  Follow.   4. Iron excess Patient noted to have elevated 191, elevated set ratio of 88%, elevated ferritin. No chronic transfusions. Follow-up GI work-up, testing for hemochromatosis pending. Follow.  5. Hypothyroidism Synthroid.    6.. Bacteria in urine Urine cultures negative.  Status post IV Rocephin.  Antibiotics discontinued.  7. Macrocytic anemia/folate deficiency B12 normal. Folate low.  Continue folic acid daily.   8. Hypertension Continue Norvasc.  9. Hyperlipidemia Statin on hold secondary to transaminitis.   10. Hypokalemia/hypomagnesemia Hypokalemia likely secondary to diuresis.  Potassium at 3.5 this morning.  Magnesium at 2.6.  Give a dose of oral potassium.  Repeat labs in the morning.   11.  Nondisplaced distal radial metaphyseal fracture Secondary to mechanical fall.  Case discussed with orthopedics who recommended Velcro wrist splint with outpatient follow-up.    12.  History of dysfunctional uterine bleeding/fibroids Patient noted to be on Notrel and stated last time this was discontinued during the hospitalization she had significant uterine bleeding.  Concern notrel may in part be contributing to her worsening transaminitis.  [facility] who feel that there is no medical therapy at this time to offer patient has not hormonally related and discussed with GI who would like to hold off on any additional medications at this time.  Will need outpatient follow-up with OB/GYN.     DVT prophylaxis: SCDs Code Status: Full Family Communication: Updated patient.  No family at bedside.   Disposition:   Status is: Inpatient    Dispo: The patient is from: Home              Anticipated d/c is to: Home              Anticipated d/c date is: To be determined.              Patient currently jaundice with hyperbilirubinemia unknown etiology with RUQ abdominal pain.  May need to be transferred to a tertiary care center.  Not  stable for discharge.       Consultants:   General surgery: Dr. Barry Dienes 07-25-2020  Interventional radiology: Dr. Earleen Newport 07-22-2020  Gastroenterology: Dr. Therisa Doyne 07-18-2020  [facility]: Dr.Ervin 07/28/2020  Procedures:   CT abdomen and pelvis 07-17-2020  Plain films of the right wrist 07-26-2020  MRCP 07-21-2020  Right upper quadrant ultrasound 07-18-2020  Ultrasound-guided liver biopsy 07-22-2020 per Dr. Earleen Newport, IR  Antimicrobials:  IV Rocephin 07-18-2020 >>>> 07-22-2020   Subjective: Jaundice.  Laying in bed.  No significant change with abdominal pain.  Denies any shortness of breath.  No chest pain.  Patient with complaints of pain in lower extremities.  Objective: Vitals:   07/28/20 1233 07/28/20 2110 07/29/20 0459 07/29/20 0500  BP: 139/72 129/71 111/71   Pulse: 79 76 73   Resp: 13 18 18    Temp: 98.6 F (37 C) 98.2 F (36.8 C) 98.1 F (36.7 C)   TempSrc: Oral Oral Oral   SpO2: 99% 99% 97%   Weight:    82.3 kg  Height:        Intake/Output Summary (Last 24 hours) at 07/29/2020 1244 Last data filed at 07/29/2020 0832 Gross per 24 hour  Intake 1440 ml  Output --  Net 1440 ml   Filed Weights   07/27/20 0452 07/28/20 0508 07/29/20 0500  Weight: 82.9 kg 81.2 kg 82.3 kg  Examination:  General exam: Jaundice.  Scleral icterus. Respiratory system: Decreased bibasilar crackles with.  No wheezes, no crackles, no rhonchi.  Normal respiratory effort.  Cardiovascular system: Regular rate rhythm no murmurs rubs or gallops.  No JVD.  2+ bilateral lower extremity edema. Gastrointestinal system: Abdomen is soft, nondistended, tenderness to palpation right upper quadrant and lower abdomen.  No rebound.  No guarding.  Positive bowel sounds.   Central nervous system: Alert and oriented. No focal neurological deficits. Extremities: Symmetric 5 x 5 power.  2+ bilateral lower extremity edema. Skin: No rashes, lesions or ulcers Psychiatry: Judgement and  insight appear normal. Mood & affect appropriate.     Data Reviewed: I have personally reviewed following labs and imaging studies  CBC: Recent Labs  Lab 07/23/20 0504 07/26/20 0405 07/27/20 0355 07/28/20 0428 07/28/20 1435  WBC 3.8* 5.5 5.1 27.3* 5.9  NEUTROABS 2.3 2.9  --  23.5* 3.6  HGB 8.4* 9.5* 9.0* 8.9* 8.7*  HCT 26.2* 28.6* 27.4* 29.0* 26.6*  MCV 127.8* 119.2* 117.6* 92.1 117.7*  PLT 169 169 209 133* 638    Basic Metabolic Panel: Recent Labs  Lab 07/25/20 0506 07/26/20 0405 07/27/20 0355 07/28/20 0428 07/28/20 1435 07/29/20 0415  NA 130* 132* 130* 136 132* 133*  K 3.7 3.3* 2.8* 4.6 2.7* 3.5  CL 95* 95* 93* 103 93* 95*  CO2 23 23 25  21* 27 26  GLUCOSE 88 73 92 216* 122* 111*  BUN <5* <5* <5* 50* <5* <5*  CREATININE 0.65 0.50 0.62 1.69* 0.62 0.48  CALCIUM 8.7* 8.7* 8.3* 9.1 8.4* 9.0  MG 1.6* 1.8 2.1  --  1.7 2.6*    GFR: Estimated Creatinine Clearance: 82.9 mL/min (by C-G formula based on SCr of 0.48 mg/dL).  Liver Function Tests: Recent Labs  Lab 07/26/20 0405 07/27/20 0355 07/28/20 0428 07/28/20 1435 07/29/20 0415  AST 157* 164* 22 173* 196*  ALT 55* 53* 15 48* 57*  ALKPHOS 108 104 50 127* 141*  BILITOT 17.4* 18.0* 0.5 18.0* 21.2*  PROT 5.6* 5.4* 5.5* 5.5* 6.1*  ALBUMIN 2.4* 2.3* 2.3* 2.2* 2.5*    CBG: Recent Labs  Lab 07/22/20 2131  GLUCAP 94     No results found for this or any previous visit (from the past 240 hour(s)).       Radiology Studies: No results found.      Scheduled Meds: . amLODipine  5 mg Oral Daily  . feeding supplement  237 mL Oral Q24H  . folic acid  1 mg Oral Daily  . levothyroxine  75 mcg Oral Q0600  . polyethylene glycol  17 g Oral Daily  . predniSONE  20 mg Oral Q breakfast  . senna  1 tablet Oral BID  . thiamine  100 mg Oral Daily  . ursodiol  600 mg Oral BID  . venlafaxine XR  75 mg Oral Daily   Continuous Infusions:    LOS: 11 days    Time spent: 35 minutes    Irine Seal,  MD [facility]   To contact the attending provider between 7A-7P or the covering provider during after hours 7P-7A, please log into the web site [email or web address] and access using universal [place] password for that web site. If you do not have the password, please call the hospital operator.  07/29/2020, 12:44 PM

## 2020-07-29 NOTE — Progress Notes (Signed)
Physical Therapy Treatment Patient Details Name: Judy Meyer MRN: [id number] DOB: 27-Oct-1960 Today's Date: 07/29/2020    History of Present Illness 59 y.o. female with medical history significant of fibroids, HTN, HLD, depression and admitted for nausea and abdominal pain, found to have Hyperbilirubinemia/ elevated LFT's. Pt reports fall in hospital while trying to wipe up the floor and RT wrist X-ray on 07/26/20 revealed Nondisplaced distal radial metaphyseal fracture and RT wrist splint ordered.    PT Comments    Pt reports feeling tired today however agreeable to ambulate.  Pt encouraged to perform ankle pumps and LEs elevated in bed prior to leaving room due to increased LE edema.    Follow Up Recommendations  Home health PT;Supervision for mobility/OOB     Equipment Recommendations  Rolling walker with 5" wheels    Recommendations for Other Services       Precautions / Restrictions Precautions Precautions: Fall Required Braces or Orthoses: Splint/Cast Splint/Cast: RT wrist Restrictions Weight Bearing Restrictions: Yes Other Position/Activity Restrictions: keeping R UE NWB until further clarification    Mobility  Bed Mobility Overal bed mobility: Needs Assistance Bed Mobility: Sit to Supine;Supine to Sit     Supine to sit: Supervision Sit to supine: Supervision   General bed mobility comments: increased time and effort, cues for not using R wrist  Transfers Overall transfer level: Needs assistance Equipment used: 1 person hand held assist Transfers: Sit to/from Stand Sit to Stand: Min guard         General transfer comment: verbal cues for self assisting with left UE only  Ambulation/Gait Ambulation/Gait assistance: Min guard Gait Distance (Feet): 160 Feet Assistive device: None Gait Pattern/deviations: Step-through pattern;Decreased stride length     General Gait Details: min/guard for safety as pt with unsteady gait without RW today and  declined left HHA, distance to tolerance   Stairs             Wheelchair Mobility    Modified Rankin (Stroke Patients Only)       Balance                                            Cognition Arousal/Alertness: Awake/alert Behavior During Therapy: WFL for tasks assessed/performed Overall Cognitive Status: Within Functional Limits for tasks assessed                                        Exercises      General Comments        Pertinent Vitals/Pain Pain Assessment: No/denies pain    Home Living                      Prior Function            PT Goals (current goals can now be found in the care plan section) Progress towards PT goals: Progressing toward goals    Frequency    Min 3X/week      PT Plan Current plan remains appropriate    Co-evaluation              AM-PAC PT "6 Clicks" Mobility   Outcome Measure  Help needed turning from your back to your side while in a flat bed without using bedrails?: A Little Help needed moving from  lying on your back to sitting on the side of a flat bed without using bedrails?: A Little Help needed moving to and from a bed to a chair (including a wheelchair)?: A Little Help needed standing up from a chair using your arms (e.g., wheelchair or bedside chair)?: A Little Help needed to walk in hospital room?: A Little Help needed climbing 3-5 steps with a railing? : A Little 6 Click Score: 18    End of Session Equipment Utilized During Treatment: Gait belt Activity Tolerance: Patient tolerated treatment well Patient left: in bed;with call bell/phone within reach   PT Visit Diagnosis: Difficulty in walking, not elsewhere classified (R26.2);Muscle weakness (generalized) (M62.81)     Time: 1610-9604 PT Time Calculation (min) (ACUTE ONLY): 8 min  Charges:  $Gait Training: 8-22 mins                    Paulino Door, DPT Acute Rehabilitation Services Pager:  [phone] Office: [phone]  Maida Sale E 07/29/2020, 3:10 PM

## 2020-07-29 NOTE — Progress Notes (Signed)
Chaplain made initial visit with patient without referral.  Patient said she welcomed chaplain visit. "I believe in God and Jesus."  Chaplain learned that patient has been ill for a few months without clear diagnosis and there was "talk of sending me to [facility] or [facility] but they had no beds." Chaplain learned that patient had had difficulty walking and injured her hand in a fall.  She did not speak of family and friends but her boyfriend who visits her regularly.  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  Chaplain offered support and prayer for patient.  Rev. Tamsen Snider Pager [phone]

## 2020-07-30 LAB — COMPREHENSIVE METABOLIC PANEL
ALT: 53 U/L — ABNORMAL HIGH (ref 0–44)
AST: 172 U/L — ABNORMAL HIGH (ref 15–41)
Albumin: 2.3 g/dL — ABNORMAL LOW (ref 3.5–5.0)
Alkaline Phosphatase: 125 U/L (ref 38–126)
Anion gap: 13 (ref 5–15)
BUN: <5 mg/dL — ABNORMAL LOW (ref 6–20)
CO2: 22 mmol/L (ref 22–32)
Calcium: 8.7 mg/dL — ABNORMAL LOW (ref 8.9–10.3)
Chloride: 98 mmol/L (ref 98–111)
Creatinine, Ser: 0.53 mg/dL (ref 0.44–1.00)
GFR, Estimated: >60 mL/min (ref >60)
Glucose, Bld: 82 mg/dL (ref 70–99)
Potassium: 3.4 mmol/L — ABNORMAL LOW (ref 3.5–5.1)
Sodium: 133 mmol/L — ABNORMAL LOW (ref 135–145)
Total Bilirubin: 18.3 mg/dL (ref 0.3–1.2)
Total Protein: 5.4 g/dL — ABNORMAL LOW (ref 6.5–8.1)

## 2020-07-30 LAB — PROTIME-INR
INR: 1.1 (ref 0.8–1.2)
Prothrombin Time: 13.6 seconds (ref 11.4–15.2)

## 2020-07-30 LAB — CK: Total CK: 337 U/L — ABNORMAL HIGH (ref 38–234)

## 2020-07-30 MED ORDER — POTASSIUM CHLORIDE CRYS ER 20 MEQ PO TBCR
40.0000 meq | EXTENDED_RELEASE_TABLET | Freq: Once | ORAL | Status: AC
  Administered 2020-07-30: 40 meq via ORAL
  Filled 2020-07-30: qty 2

## 2020-07-30 MED ORDER — FUROSEMIDE 10 MG/ML IJ SOLN
40.0000 mg | Freq: Once | INTRAMUSCULAR | Status: AC
  Administered 2020-07-30: 40 mg via INTRAVENOUS
  Filled 2020-07-30: qty 4

## 2020-07-30 NOTE — Plan of Care (Signed)
  Problem: Education: Goal: Knowledge of General Education information will improve Description Including pain rating scale, medication(s)/side effects and non-pharmacologic comfort measures Outcome: Progressing   Problem: Health Behavior/Discharge Planning: Goal: Ability to manage health-related needs will improve Outcome: Progressing   

## 2020-07-30 NOTE — Progress Notes (Signed)
[facility] Gastroenterology Progress Note  Judy Meyer 59 y.o. Jun 10, 1961  CC: Abnormal LFTs, lower extremity weakness   Subjective: Patient seen and examined at bedside.  Continues to have bilateral lower extremity weakness.  Having 1-3 bowel movements daily.  Denies any blood in the stool or black stool.  Denies nausea or vomiting.  ROS : Afebrile, negative for chest pain   Objective: Vital signs in last 24 hours: Vitals:   07/29/20 2017 07/30/20 0524  BP: 133/72 129/73  Pulse: 77 79  Resp: 15 16  Temp: 98.3 F (36.8 C) 98 F (36.7 C)  SpO2: 100% 97%    Physical Exam:  General:  Alert, cooperative, no distress, appears stated age  Head:  Normocephalic, without obvious abnormality, atraumatic  Eyes:   Scleral icterus noted  Lungs:   Clear to auscultation bilaterally, respirations unlabored  Heart:  Regular rate and rhythm, S1, S2 normal  Abdomen:   Soft, non-tender, nondistended, bowel sounds present.  No peritoneal signs  Extremities:  no  edema  Psych:  Anxious    Lab Results: Recent Labs    07/28/20 1435 07/29/20 0415 07/30/20 0502  NA 132* 133* 133*  K 2.7* 3.5 3.4*  CL 93* 95* 98  CO2 27 26 22   GLUCOSE 122* 111* 82  BUN <5* <5* <5*  CREATININE 0.62 0.48 0.53  CALCIUM 8.4* 9.0 8.7*  MG 1.7 2.6*  --    Recent Labs    07/29/20 0415 07/30/20 0502  AST 196* 172*  ALT 57* 53*  ALKPHOS 141* 125  BILITOT 21.2* 18.3*  PROT 6.1* 5.4*  ALBUMIN 2.5* 2.3*   Recent Labs    07/28/20 0428 07/28/20 1435  WBC 27.3* 5.9  NEUTROABS 23.5* 3.6  HGB 8.9* 8.7*  HCT 29.0* 26.6*  MCV 92.1 117.7*  PLT 133* 217   Recent Labs    07/30/20 0502  LABPROT 13.6  INR 1.1      Assessment/Plan: Assessment  ---------------  -Abnormal LFTs with significant jaundice.  Negative serological work-up .  MRI MRCP negative for acute changes.   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  -Lower extremity weakness.  Elevated CPK.     Recommendations  -----------------------------  -Mild drop in bilirubin compared to yesterday.  NR. - continue prednisone 20 mg once a day and ursodiol 600 mg twice a day -Repeat CMP tomorrow   -Case was discussed with Dr. Laurier Nancy at [facility] hepatology 12/202021 Recommended evaluate for drug-induced liver injury. Also recommended to start prednisone 20 mg daily along with ursodiol 15 mg/kg.       -GI will follow.    Otis Brace MD, [place] 07/30/2020, 9:53 AM  Contact #  [phone]

## 2020-07-30 NOTE — Progress Notes (Signed)
PROGRESS NOTE    Judy Meyer  WNU:[id number] DOB: Dec 22, 1960 DOA: 07/17/2020 PCP: Pcp, No    Chief Complaint  Patient presents with  . Vomiting  . Abdominal Pain    Brief Narrative:  59 yo female with PMH hypothyroidism, hypertension, uterine fibroids, hyperlipidemia, depression who presented to the ER with worsening nausea and vomiting with jaundice, decreased strength, weight loss. On work-up she was found to have multiple lab abnormalities. Also underwent ultrasound abdomen which showed gallbladder stones and sludge. LFTs were elevated and she was also found to have iron excess.  GI was consulted on admission as well.General surgery also consulted and felt no need for surgical intervention at this time. Patient underwent ultrasound-guided liver biopsy which was consistent with severe hepatic steatosis and intrahepatic cholestasis. Bilirubin continues to rise. GI following.   Assessment & Plan:   Transaminitis/hyperbilirubinemia/right upper quadrant abdominal pain Questionable etiology. Right upper quadrant ultrasound done showed cholelithiasis and sludge but no evidence of acute cholecystitis. MRCP which was done was negative for any acute findings without evidence of gallstones, obstructive lesions or biliary dilatation. HIDA scan was unable to be done as total bilirubin noted to be elevated greater than 4.5 as per radiology.  Patient underwent liver biopsy which was consistent with severe hepatic steatosis with intrahepatic cholestasis with no clear explanation.  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   Acute hepatitis panel which was done was negative.  Patient noted to be on Nortrel for fibroids which she has taken for several years for dysfunctional uterine bleeding. Not sure not resumed during this hospitalization however per GI patient noted to have been taking her own supply in the room.  Gastroenterology had discussions  with providers at [facility].  Started on prednisone and ursodiol.  Discussion was also held with [facility].  Patient was not accepted for transfer there. Gastroenterology continues to follow and manage.  Bilirubin remains elevated.  Continues to have abdominal discomfort though she does not have any vomiting.  MRI abdomen done on 12/13 did not show any other acute findings. Patient continues to have abnormal LFTs.  Bilirubin remains elevated.  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 Patient with volume overload on examination as such IV fluids discontinued.  CK levels have been trending downward.  Continue to monitor periodically  Bilateral lower extremity edema Secondary to aggressive fluid resuscitation also secondary to hypoalbuminemia. Patient noted to be +10 L during this hospitalization.  Patient was given IV Lasix for few days with good diuresis.  May give additional dose today.  Replace potassium.   Iron excess Iron was noted to be 191.  Ferritin 802.  Percent saturation 88%.  No history of chronic transfusions. Follow-up GI work-up, testing for hemochromatosis pending.   Hypothyroidism Synthroid.    Bacteria in urine Urine cultures negative.  Status post IV Rocephin.  Antibiotics discontinued.  Macrocytic anemia/folate deficiency B12 normal. Folate low.  Continue folic acid daily.   Essential hypertension Continue Norvasc.  Hyperlipidemia Statin on hold secondary to transaminitis.   Hypokalemia/hypomagnesemia Replace potassium.  Magnesium was 2.6 yesterday.    Nondisplaced distal radial metaphyseal fracture Secondary to mechanical fall.  Case discussed with orthopedics who recommended Velcro wrist splint with outpatient follow-up.    History of dysfunctional uterine bleeding/fibroids Patient noted to be on Notrel and stated last time this was discontinued during the hospitalization she had significant  uterine bleeding.  Concern notrel may in part be  contributing to her worsening transaminitis.  [facility] who feel that there is no medical therapy at this time to offer patient has not hormonally related and discussed with GI who would like to hold off on any additional medications at this time.  Will need outpatient follow-up with OB/GYN.     DVT prophylaxis: SCDs Code Status: Full Family Communication: Updated patient.  No family at bedside.   Disposition: Waiting on improvement in liver function test.  Likely home when improved.  Status is: Inpatient    Dispo: The patient is from: Home              Anticipated d/c is to: Home              Anticipated d/c date is: To be determined.              Patient currently jaundice with hyperbilirubinemia unknown etiology with RUQ abdominal pain.  May need to be transferred to a tertiary care center.  Not stable for discharge.       Consultants:   General surgery: Dr. Barry Dienes 07-25-2020  Interventional radiology: Dr. Earleen Newport 07-22-2020  Gastroenterology: Dr. Therisa Doyne 07-18-2020  [facility] OB/GYN: Dr.Ervin 07/28/2020  Procedures:   CT abdomen and pelvis 07-17-2020  Plain films of the right wrist 07-26-2020  MRCP 07-21-2020  Right upper quadrant ultrasound 07-18-2020  Ultrasound-guided liver biopsy 07-22-2020 per Dr. Earleen Newport, IR  Antimicrobials:  IV Rocephin 07-18-2020 >>>> 07-22-2020   Subjective: Complains of abdominal pain all over the abdomen.  No nausea vomiting.  No chest pain or shortness of breath   Objective: Vitals:   07/29/20 1347 07/29/20 2017 07/30/20 0500 07/30/20 0524  BP: (!) 148/75 133/72  129/73  Pulse: 85 77  79  Resp: 20 15  16   Temp: 97.8 F (36.6 C) 98.3 F (36.8 C)  98 F (36.7 C)  TempSrc: Oral Oral  Oral  SpO2: 97% 100%  97%  Weight:   84.3 kg   Height:        Intake/Output Summary (Last 24 hours) at 07/30/2020 1044 Last data filed at 07/29/2020 1833 Gross per 24 hour  Intake 240  ml  Output 925 ml  Net -685 ml   Filed Weights   07/28/20 0508 07/29/20 0500 07/30/20 0500  Weight: 81.2 kg 82.3 kg 84.3 kg    Examination:  General appearance: Awake alert.  In no distress Resp: Clear to auscultation bilaterally.  Normal effort Cardio: S1-S2 is normal regular.  No S3-S4.  No rubs murmurs or bruit GI: Abdomen is soft.  Mildly tender diffusely without any rebound rigidity or guarding.  No masses or organomegaly appreciated.  Extremities: Bilateral lower extremity edema noted.  Able to move all her 4 extremities.   Neurologic: Alert and oriented x3.  No focal neurological deficits.     Data Reviewed: I have personally reviewed following labs and imaging studies  CBC: Recent Labs  Lab 07/26/20 0405 07/27/20 0355 07/28/20 0428 07/28/20 1435  WBC 5.5 5.1 27.3* 5.9  NEUTROABS 2.9  --  23.5* 3.6  HGB 9.5* 9.0* 8.9* 8.7*  HCT 28.6* 27.4* 29.0* 26.6*  MCV 119.2* 117.6* 92.1 117.7*  PLT 169 209 133* [id number]    Basic Metabolic Panel: Recent Labs  Lab 07/25/20 0506 07/26/20 0405 07/27/20 0355 07/28/20 0428 07/28/20 1435 07/29/20 0415 07/30/20 0502  NA 130* 132* 130* 136 132* 133* 133*  K 3.7 3.3* 2.8* 4.6 2.7* 3.5 3.4*  CL 95* 95* 93* 103 93* 95* 98  CO2 23 23 25  21* 27 26 22   GLUCOSE 88 73 92 216* 122* 111* 82  BUN <5* <5* <5* 50* <5* <5* <5*  CREATININE 0.65 0.50 0.62 1.69* 0.62 0.48 0.53  CALCIUM 8.7* 8.7* 8.3* 9.1 8.4* 9.0 8.7*  MG 1.6* 1.8 2.1  --  1.7 2.6*  --     GFR: Estimated Creatinine Clearance: 83.9 mL/min (by C-G formula based on SCr of 0.53 mg/dL).  Liver Function Tests: Recent Labs  Lab 07/27/20 0355 07/28/20 0428 07/28/20 1435 07/29/20 0415 07/30/20 0502  AST 164* 22 173* 196* 172*  ALT 53* 15 48* 57* 53*  ALKPHOS 104 50 127* 141* 125  BILITOT 18.0* 0.5 18.0* 21.2* 18.3*  PROT 5.4* 5.5* 5.5* 6.1* 5.4*  ALBUMIN 2.3* 2.3* 2.2* 2.5* 2.3*    CBG: No results for input(s): GLUCAP in the last 168 hours.   No results found for  this or any previous visit (from the past 240 hour(s)).       Radiology Studies: No results found.      Scheduled Meds: . amLODipine  5 mg Oral Daily  . feeding supplement  237 mL Oral Q24H  . folic acid  1 mg Oral Daily  . levothyroxine  75 mcg Oral Q0600  . polyethylene glycol  17 g Oral Daily  . predniSONE  20 mg Oral Q breakfast  . senna  1 tablet Oral BID  . thiamine  100 mg Oral Daily  . ursodiol  600 mg Oral BID  . venlafaxine XR  75 mg Oral Daily   Continuous Infusions:    LOS: 12 days      Bonnielee Haff, MD [facility]   To contact the attending provider between 7A-7P or the covering provider during after hours 7P-7A, please log into the web site [email or web address] and access using universal [place] password for that web site. If you do not have the password, please call the hospital operator.  07/30/2020, 10:44 AM

## 2020-07-30 NOTE — Plan of Care (Signed)
Family called and is on the way to pick up patient.  IV removed, cath intact.  Patient has information (called by [facility]) to get help for rehab per COC.  Patient is stable at this time.  Patient has all belongings.

## 2020-07-31 DIAGNOSIS — M546 Pain in thoracic spine: Secondary | ICD-10-CM

## 2020-07-31 LAB — COMPREHENSIVE METABOLIC PANEL
ALT: 56 U/L — ABNORMAL HIGH (ref 0–44)
AST: 197 U/L — ABNORMAL HIGH (ref 15–41)
Albumin: 2.5 g/dL — ABNORMAL LOW (ref 3.5–5.0)
Alkaline Phosphatase: 133 U/L — ABNORMAL HIGH (ref 38–126)
Anion gap: 12 (ref 5–15)
BUN: 8 mg/dL (ref 6–20)
CO2: 26 mmol/L (ref 22–32)
Calcium: 8.7 mg/dL — ABNORMAL LOW (ref 8.9–10.3)
Chloride: 96 mmol/L — ABNORMAL LOW (ref 98–111)
Creatinine, Ser: 0.62 mg/dL (ref 0.44–1.00)
GFR, Estimated: >60 mL/min (ref >60)
Glucose, Bld: 74 mg/dL (ref 70–99)
Potassium: 3.6 mmol/L (ref 3.5–5.1)
Sodium: 134 mmol/L — ABNORMAL LOW (ref 135–145)
Total Bilirubin: 21.2 mg/dL (ref 0.3–1.2)
Total Protein: 5.9 g/dL — ABNORMAL LOW (ref 6.5–8.1)

## 2020-07-31 LAB — CBC
HCT: 29.8 % — ABNORMAL LOW (ref 36.0–46.0)
Hemoglobin: 9.5 g/dL — ABNORMAL LOW (ref 12.0–15.0)
MCH: 38.3 pg — ABNORMAL HIGH (ref 26.0–34.0)
MCHC: 31.9 g/dL (ref 30.0–36.0)
MCV: 120.2 fL — ABNORMAL HIGH (ref 80.0–100.0)
Platelets: 247 10*3/uL (ref 150–400)
RBC: 2.48 MIL/uL — ABNORMAL LOW (ref 3.87–5.11)
RDW: 17.2 % — ABNORMAL HIGH (ref 11.5–15.5)
WBC: 8.7 10*3/uL (ref 4.0–10.5)
nRBC: 0 % (ref 0.0–0.2)

## 2020-07-31 LAB — MAGNESIUM: Magnesium: 2 mg/dL (ref 1.7–2.4)

## 2020-07-31 MED ORDER — POTASSIUM CHLORIDE CRYS ER 20 MEQ PO TBCR
40.0000 meq | EXTENDED_RELEASE_TABLET | Freq: Once | ORAL | Status: AC
  Administered 2020-07-31: 40 meq via ORAL
  Filled 2020-07-31: qty 2

## 2020-07-31 MED ORDER — FUROSEMIDE 10 MG/ML IJ SOLN
20.0000 mg | Freq: Once | INTRAMUSCULAR | Status: AC
  Administered 2020-07-31: 20 mg via INTRAVENOUS
  Filled 2020-07-31: qty 2

## 2020-07-31 NOTE — TOC Progression Note (Signed)
Transition of Care [facility] [employer] [facility]) - Progression Note    Patient Details  Name: Judy Meyer MRN: [id number] Date of Birth: October 23, 1960  Transition of Care [facility]) CM/SW Contact  Purcell Mouton, RN Phone Number: 07/31/2020, 2:06 PM  Clinical Narrative:    Pt will transfer to [place] when a bed is available.    Expected Discharge Plan: [place] Barriers to Discharge: No Barriers Identified  Expected Discharge Plan and Services Expected Discharge Plan: [place] arrangements for the past 2 months: Single Family Home                                       Social Determinants of Health (SDOH) Interventions    Readmission Risk Interventions No flowsheet data found.

## 2020-07-31 NOTE — Progress Notes (Signed)
Occupational Therapy Treatment Patient Details Name: Judy Meyer MRN: [id number] DOB: 17-May-1961 Today's Date: 07/31/2020    History of present illness 59 y.o. female with medical history significant of fibroids, HTN, HLD, depression and admitted for nausea and abdominal pain, found to have Hyperbilirubinemia/ elevated LFT's. Pt reports fall in hospital while trying to wipe up the floor and RT wrist X-ray on 07/26/20 revealed Nondisplaced distal radial metaphyseal fracture and RT wrist splint ordered.   OT comments  Patient demonstrates ability to perform  BADLs including toileting, standing at sink to perform grooming and donning LB clothing. Patient demonstrates the physical abilities to perform bathing and UB bathing. Patient ambulated in room without device and performed bed mobility. Therapist provided supervision for all activities - no physical assistance. Patient has been wearing R wrist splint - currently not donned due to just spilling tea on it and allowing it to dry. Patient reports sit to stand as the most difficult task for her but able to perform with hand hold as demonstrated with toilet transfer. Patient has met acute care OT goals. Patient does reports continued LE weakness and therapist recommends continued PT services.    Follow Up Recommendations       Equipment Recommendations  Tub/shower seat    Recommendations for Other Services      Precautions / Restrictions Precautions Precautions: Fall Precaution Comments: Slipped on water in bathroom and now on fall precautions. Required Braces or Orthoses: Splint/Cast Splint/Cast: RT wrist Restrictions Weight Bearing Restrictions: Yes RUE Weight Bearing: Non weight bearing Other Position/Activity Restrictions: keeping R UE NWB until further clarification       Mobility Bed Mobility Overal bed mobility: Modified Independent             General bed mobility comments: increased time and effort, unable to use  right arm  Transfers Overall transfer level: Needs assistance Equipment used: None             General transfer comment: Supervision to beform all ambulation in room. Exhibited difficulty with sit to stand - patient reports standing is the "thing that is most difficult"    Balance   Sitting-balance support: No upper extremity supported;Feet supported Sitting balance-Leahy Scale: Good     Standing balance support: No upper extremity supported Standing balance-Leahy Scale: Good                             ADL either performed or assessed with clinical judgement   ADL Overall ADL's : Needs assistance/impaired     Grooming: Standing;Oral care;Wash/dry face;Wash/dry hands;Supervision/safety Grooming Details (indicate cue type and reason): stood at sink to perform grooming task x 5 minu         Upper Body Dressing : Set up;Sitting Upper Body Dressing Details (indicate cue type and reason): demonstrates ability to perform lower body dressing     Toilet Transfer: Supervision/safety;Grab bars Toilet Transfer Details (indicate cue type and reason): reports performing toilet transfer with supervision only - was going independently but now nursing won't let her because of a fall Toileting- Clothing Manipulation and Hygiene: Supervision/safety Toileting - Clothing Manipulation Details (indicate cue type and reason): reports performing toilet transfer with supervision only - was going independently but now nursing won't let her because of a fall.             Vision Baseline Vision/History: Wears glasses Wears Glasses: At all times Patient Visual Report: No change from baseline  Perception     Praxis      Cognition Arousal/Alertness: Awake/alert Behavior During Therapy: WFL for tasks assessed/performed Overall Cognitive Status: Within Functional Limits for tasks assessed                                          Exercises     Shoulder  Instructions       General Comments      Pertinent Vitals/ Pain       Pain Assessment: Faces Faces Pain Scale: Hurts a little bit Pain Location: low back Pain Descriptors / Indicators: Sore;Grimacing Pain Intervention(s): Monitored during session  Home Living                                          Prior Functioning/Environment              Frequency           Progress Toward Goals  OT Goals(current goals can now be found in the care plan section)  Progress towards OT goals: Goals met/education completed, patient discharged from OT  Acute Rehab OT Goals Patient Stated Goal: LE strengthening OT Goal Formulation: All assessment and education complete, DC therapy  Plan All goals met and education completed, patient discharged from OT services    Co-evaluation                 AM-PAC OT "6 Clicks" Daily Activity     Outcome Measure   Help from another person eating meals?: None Help from another person taking care of personal grooming?: None Help from another person toileting, which includes using toliet, bedpan, or urinal?: None Help from another person bathing (including washing, rinsing, drying)?: None Help from another person to put on and taking off regular upper body clothing?: None Help from another person to put on and taking off regular lower body clothing?: None 6 Click Score: 24    End of Session    OT Visit Diagnosis: Other abnormalities of gait and mobility (R26.89);History of falling (Z91.81);Muscle weakness (generalized) (M62.81)   Activity Tolerance Patient tolerated treatment well   Patient Left in bed;with call bell/phone within reach;with bed alarm set   Nurse Communication Mobility status        Time: 1045-1101 OT Time Calculation (min): 16 min  Charges: OT General Charges $OT Visit: 1 Visit OT Treatments $Self Care/Home Management : 8-22 mins  Derl Barrow, OTR/L [place]  Office  [phone] Pager: [place] 07/31/2020, 2:43 PM

## 2020-07-31 NOTE — Progress Notes (Signed)
[facility] Gastroenterology Progress Note  Judy Meyer 59 y.o. [birth year]  CC: Abnormal LFTs, lower extremity weakness   Subjective: Patient seen and examined at bedside.  Now complaining of back pain and back spasm.  Continues to have lower extremity weakness.  Denies abdominal pain.  Tolerating diet.  Had some nausea but denies any vomiting.  ROS : Afebrile, negative for chest pain   Objective: Vital signs in last 24 hours: Vitals:   07/30/20 2100 07/31/20 0447  BP: (!) 147/74 (!) 148/77  Pulse: 71 78  Resp: 16 16  Temp: 98 F (36.7 C) 97.9 F (36.6 C)  SpO2: 98% 100%    Physical Exam:  General:  Alert, cooperative, no distress, appears stated age  Head:  Normocephalic, without obvious abnormality, atraumatic  Eyes:   Scleral icterus noted  Lungs:   Clear to auscultation bilaterally, respirations unlabored  Heart:  Regular rate and rhythm, S1, S2 normal  Abdomen:   Soft, non-tender, nondistended, bowel sounds present.  No peritoneal signs  Extremities:  no  edema  Psych:  Anxious    Lab Results: Recent Labs    07/29/20 0415 07/30/20 0502 07/31/20 0457  NA 133* 133* 134*  K 3.5 3.4* 3.6  CL 95* 98 96*  CO2 26 22 26   GLUCOSE 111* 82 74  BUN <5* <5* 8  CREATININE 0.48 0.53 0.62  CALCIUM 9.0 8.7* 8.7*  MG 2.6*  --  2.0   Recent Labs    07/30/20 0502 07/31/20 0457  AST 172* 197*  ALT 53* 56*  ALKPHOS 125 133*  BILITOT 18.3* 21.2*  PROT 5.4* 5.9*  ALBUMIN 2.3* 2.5*   Recent Labs    07/28/20 1435 07/31/20 0457  WBC 5.9 8.7  NEUTROABS 3.6  --   HGB 8.7* 9.5*  HCT 26.6* 29.8*  MCV 117.7* 120.2*  PLT 217 247   Recent Labs    07/30/20 0502  LABPROT 13.6  INR 1.1      Assessment/Plan: Assessment  ---------------  -Abnormal LFTs with significant jaundice. T Bili 21.2.  -Elevated iron saturation (88%), iron (191), decreased TIBC (216), and elevated ferritin (802).  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   -Lower extremity weakness, now with back pain.  - Elevated CPK.    Improving   Recommendations  -----------------------------  - Mild increase in bilirubin noted - continue prednisone 20 mg once a day and ursodiol 600 mg twice a day -I will again discuss with Dr. Merrilee Jansky at [facility] neurology evaluation if not done already for lower extremity weakness and  back pain. -Repeat CMP and INR in the morning.   -Case was discussed with Dr. Laurier Nancy at [facility] hepatology 12/202021 Recommended evaluate for drug-induced liver injury. Also recommended to start prednisone 20 mg daily along with ursodiol 15 mg/kg.    -GI will follow.    Otis Brace MD, [place] 07/31/2020, 8:56 AM  Contact #  [phone]

## 2020-07-31 NOTE — Progress Notes (Signed)
PT Cancellation Note  Patient Details Name: Judy Meyer MRN: [id number] DOB: [birth year]   Cancelled Treatment:    Reason Eval/Treat Not Completed: Fatigue/lethargy limiting ability to participate Pt politely declines today however agreeable for PT to check back tomorrow.  Will check back as schedule permits.   Sufyan Meidinger,KATHrine E 07/31/2020, 3:35 PM Jannette Spanner PT, DPT Acute Rehabilitation Services Pager: [phone] Office: [phone]

## 2020-07-31 NOTE — Progress Notes (Signed)
PROGRESS NOTE    Judy Meyer  [id number] DOB: 08-06-1961 DOA: 07/17/2020 PCP: Pcp, No    Chief Complaint  Patient presents with  . Vomiting  . Abdominal Pain    Brief Narrative:  59 yo female with PMH hypothyroidism, hypertension, uterine fibroids, hyperlipidemia, depression who presented to the ER with worsening nausea and vomiting with jaundice, decreased strength, weight loss. On work-up she was found to have multiple lab abnormalities. Also underwent ultrasound abdomen which showed gallbladder stones and sludge. LFTs were elevated and she was also found to have iron excess.  GI was consulted on admission as well.General surgery also consulted and felt no need for surgical intervention at this time. Patient underwent ultrasound-guided liver biopsy which was consistent with severe hepatic steatosis and intrahepatic cholestasis. Bilirubin continues to rise. GI following.   Assessment & Plan:   Transaminitis/hyperbilirubinemia/right upper quadrant abdominal pain Questionable etiology. Right upper quadrant ultrasound done showed cholelithiasis and sludge but no evidence of acute cholecystitis. MRCP which was done was negative for any acute findings without evidence of gallstones, obstructive lesions or biliary dilatation. HIDA scan was unable to be done as total bilirubin noted to be elevated greater than 4.5 as per radiology.  Patient underwent liver biopsy which was consistent with severe hepatic steatosis with intrahepatic cholestasis with no clear explanation.  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   Acute hepatitis panel which was done was negative.  Patient noted to be on Nortrel for fibroids which she has taken for several years for dysfunctional uterine bleeding. Not sure not resumed during this hospitalization however per GI patient noted to have been taking her own supply in the room.  Gastroenterology has had  discussions with providers at [facility].  Started on prednisone and ursodiol.  Discussion was also held with [facility]  Patient was not accepted for transfer there. Gastroenterology continues to follow and manage.  Bilirubin remains elevated.  Continues to have abdominal discomfort though she does not have any vomiting.  MRI abdomen done on 12/13 did not show any other acute findings. Management per gastroenterology.  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 Patient with volume overload on examination as such IV fluids discontinued.  CK levels have been trending downward.    Back pain Patient mentions that she developed back pain earlier this morning.  Located in her mid back area.  She points to the lower thoracic spine region.  She was tender in that area as well as in the paraspinal regions.  No recent falls or injuries.  Does not have any neurological weakness in the lower extremities.  She ambulated 160 feet with physical therapy yesterday.  Pain is now better controlled.  If she continues to have significant discomfort in her back will need to do further work-up.  Continue to monitor for now.  Recent imaging studies did not show any osseous lesions.   Bilateral lower extremity edema Secondary to aggressive fluid resuscitation also secondary to hypoalbuminemia. Patient noted to be +10 L during this hospitalization.  Patient was given IV Lasix for few days with good diuresis.  Given another dose yesterday with good diuresis.  Edema seems to be improving.  We will give another dose today and replace her potassium.  Iron excess Iron was noted to be 191.  Ferritin 802.  Percent saturation 88%.  No history of chronic transfusions. Follow-up GI work-up, testing for hemochromatosis pending.   Hypothyroidism Synthroid.  Bacteria in urine Urine cultures negative.  Status post IV Rocephin.  Antibiotics discontinued.  Macrocytic  anemia/folate deficiency B12 normal. Folate low.  Continue folic acid daily.  Hemoglobin has been stable.  Essential hypertension Continue Norvasc.  Hyperlipidemia Statin on hold secondary to transaminitis.   Hypokalemia/hypomagnesemia Replace potassium.  Magnesium was 2.6 12/21.Marland Kitchen    Nondisplaced distal radial metaphyseal fracture Secondary to mechanical fall.  Case discussed with orthopedics who recommended Velcro wrist splint with outpatient follow-up.    History of dysfunctional uterine bleeding/fibroids Patient noted to be on Notrel and stated last time this was discontinued during the hospitalization she had significant uterine bleeding.  Concern that notrel may in part be contributing to her worsening transaminitis.  Previous rounding MD discussed with OB/GYN who felt that there is no medical therapy at this time to offer patient has not hormonally related and discussed with GI who would like to hold off on any additional medications at this time.  Will need outpatient follow-up with OB/GYN.     DVT prophylaxis: SCDs Code Status: Full Family Communication: No family at bedside Disposition: Waiting on improvement in liver function test.  Likely home when improved.  Status is: Inpatient    Dispo: The patient is from: Home              Anticipated d/c is to: Home              Anticipated d/c date is: To be determined.              Patient currently jaundice with hyperbilirubinemia unknown etiology with RUQ abdominal pain.  May need to be transferred to a tertiary care center.  Not stable for discharge.       Consultants:   General surgery: Dr. Barry Dienes 07-25-2020  Interventional radiology: Dr. Earleen Newport 07-22-2020  [facility] Gastroenterology  Curb sided OB/GYN: Dr.Ervin 07/28/2020  Procedures:   CT abdomen and pelvis 07-17-2020  Plain films of the right wrist 07-26-2020  MRCP 07-21-2020  Right upper quadrant ultrasound 07-18-2020  Ultrasound-guided liver biopsy  07-22-2020 per Dr. Earleen Newport, IR  Antimicrobials:  IV Rocephin 07-18-2020 >>>> 07-22-2020   Subjective: Patient complains of pain in her back this morning.  No better after she was given pain medications.  Denies radiation down to her legs.  Otherwise no other complaints offered.  Continues to have her abdominal pain as before.  No nausea or vomiting.    Objective: Vitals:   07/30/20 1259 07/30/20 2100 07/31/20 0447 07/31/20 0450  BP: 134/90 (!) 147/74 (!) 148/77   Pulse: 81 71 78   Resp: 18 16 16    Temp: 98.1 F (36.7 C) 98 F (36.7 C) 97.9 F (36.6 C)   TempSrc: Oral Oral Oral   SpO2: 97% 98% 100%   Weight:    82.6 kg  Height:        Intake/Output Summary (Last 24 hours) at 07/31/2020 1023 Last data filed at 07/31/2020 1000 Gross per 24 hour  Intake 720 ml  Output 3050 ml  Net -2330 ml   Filed Weights   07/29/20 0500 07/30/20 0500 07/31/20 0450  Weight: 82.3 kg 84.3 kg 82.6 kg    Examination:  General appearance: Awake alert.  In no distress Resp: Clear to auscultation bilaterally.  Normal effort Cardio: S1-S2 is normal regular.  No S3-S4.  No rubs murmurs or bruit GI: Abdomen is soft.  Tenderness appreciated diffusely without any rebound rigidity or guarding.  No masses organomegaly.  Back: Tenderness noted over the  lower thoracic spine area over the midline as well as in the paraspinal area. Extremities: No edema.  Full range of motion of lower extremities. Neurologic: Alert and oriented x3.  No focal neurological deficits.       Data Reviewed: I have personally reviewed following labs and imaging studies  CBC: Recent Labs  Lab 07/26/20 0405 07/27/20 0355 07/28/20 0428 07/28/20 1435 07/31/20 0457  WBC 5.5 5.1 27.3* 5.9 8.7  NEUTROABS 2.9  --  23.5* 3.6  --   HGB 9.5* 9.0* 8.9* 8.7* 9.5*  HCT 28.6* 27.4* 29.0* 26.6* 29.8*  MCV 119.2* 117.6* 92.1 117.7* 120.2*  PLT 169 209 133* 217 [id number]    Basic Metabolic Panel: Recent Labs  Lab 07/26/20 0405  07/27/20 0355 07/28/20 0428 07/28/20 1435 07/29/20 0415 07/30/20 0502 07/31/20 0457  NA 132* 130* 136 132* 133* 133* 134*  K 3.3* 2.8* 4.6 2.7* 3.5 3.4* 3.6  CL 95* 93* 103 93* 95* 98 96*  CO2 23 25 21* 27 26 22 26   GLUCOSE 73 92 216* 122* 111* 82 74  BUN <5* <5* 50* <5* <5* <5* 8  CREATININE 0.50 0.62 1.69* 0.62 0.48 0.53 0.62  CALCIUM 8.7* 8.3* 9.1 8.4* 9.0 8.7* 8.7*  MG 1.8 2.1  --  1.7 2.6*  --  2.0    GFR: Estimated Creatinine Clearance: 83 mL/min (by C-G formula based on SCr of 0.62 mg/dL).  Liver Function Tests: Recent Labs  Lab 07/28/20 0428 07/28/20 1435 07/29/20 0415 07/30/20 0502 07/31/20 0457  AST 22 173* 196* 172* 197*  ALT 15 48* 57* 53* 56*  ALKPHOS 50 127* 141* 125 133*  BILITOT 0.5 18.0* 21.2* 18.3* 21.2*  PROT 5.5* 5.5* 6.1* 5.4* 5.9*  ALBUMIN 2.3* 2.2* 2.5* 2.3* 2.5*     Radiology Studies: No results found.      Scheduled Meds: . amLODipine  5 mg Oral Daily  . feeding supplement  237 mL Oral Q24H  . folic acid  1 mg Oral Daily  . levothyroxine  75 mcg Oral Q0600  . polyethylene glycol  17 g Oral Daily  . predniSONE  20 mg Oral Q breakfast  . senna  1 tablet Oral BID  . thiamine  100 mg Oral Daily  . ursodiol  600 mg Oral BID  . venlafaxine XR  75 mg Oral Daily   Continuous Infusions:    LOS: 13 days      Bonnielee Haff, MD [facility]   To contact the attending provider between 7A-7P or the covering provider during after hours 7P-7A, please log into the web site [email or web address] and access using universal [place] password for that web site. If you do not have the password, please call the hospital operator.  07/31/2020, 10:23 AM

## 2020-07-31 NOTE — Progress Notes (Signed)
Nutrition Follow-up  DOCUMENTATION CODES:   Non-severe (moderate) malnutrition in context of acute illness/injury  INTERVENTION:   -Ensure Enlive po daily, each supplement provides 350 kcal and 20 grams of protein  NUTRITION DIAGNOSIS:   Moderate Malnutrition related to acute illness,nausea,vomiting as evidenced by moderate fat depletion,moderate muscle depletion,energy intake < or equal to 75% for > or equal to 1 month.  Ongoing.  GOAL:   Patient will meet greater than or equal to 90% of their needs  Progressing.  MONITOR:   PO intake,Supplement acceptance,Weight trends,I & O's,Labs  ASSESSMENT:   59 y.o. female with medical history significant of fibroids, HTN, HLD, depression,    Admitted for hyperbilirubinemia, dehydration, hypokalemia  Patient currently consuming 75-100% of meals. Accepting Ensure supplements daily. Pt is continuing to have some nausea.   Admission weight: 140 lbs Current weight: 182 lbs.  I/Os: +1.5L since admit UOP: 3050 ml x 24 hrs  Medications: Folic acid, Lasix, Miralax, KLOR-CON, Thiamine, Zofran  Labs reviewed: Low Na  Diet Order:   Diet Order            Diet Heart Room service appropriate? Yes; Fluid consistency: Thin  Diet effective now                 EDUCATION NEEDS:   No education needs have been identified at this time  Skin:  Skin Assessment: Reviewed RN Assessment  Last BM:  estimated 12/2  Height:   Ht Readings from Last 1 Encounters:  07/17/20 5\' 6"  (1.676 m)    Weight:   Wt Readings from Last 1 Encounters:  07/31/20 82.6 kg   BMI:  Body mass index is 29.39 kg/m.  Estimated Nutritional Needs:   Kcal:  1800-2000  Protein:  75-85g  Fluid:  2L/day   Judy Bibles, MS, RD, LDN Inpatient Clinical Dietitian Contact information available via Amion

## 2020-08-01 ENCOUNTER — Inpatient Hospital Stay (HOSPITAL_COMMUNITY): Payer: Medicare PPO

## 2020-08-01 LAB — COMPREHENSIVE METABOLIC PANEL
ALT: 46 U/L — ABNORMAL HIGH (ref 0–44)
AST: 165 U/L — ABNORMAL HIGH (ref 15–41)
Albumin: 2 g/dL — ABNORMAL LOW (ref 3.5–5.0)
Alkaline Phosphatase: 132 U/L — ABNORMAL HIGH (ref 38–126)
Anion gap: 11 (ref 5–15)
BUN: 11 mg/dL (ref 6–20)
CO2: 26 mmol/L (ref 22–32)
Calcium: 8.3 mg/dL — ABNORMAL LOW (ref 8.9–10.3)
Chloride: 97 mmol/L — ABNORMAL LOW (ref 98–111)
Creatinine, Ser: 0.45 mg/dL (ref 0.44–1.00)
GFR, Estimated: >60 mL/min (ref >60)
Glucose, Bld: 89 mg/dL (ref 70–99)
Potassium: 2.6 mmol/L — CL (ref 3.5–5.1)
Sodium: 134 mmol/L — ABNORMAL LOW (ref 135–145)
Total Bilirubin: 17.3 mg/dL — ABNORMAL HIGH (ref 0.3–1.2)
Total Protein: 4.9 g/dL — ABNORMAL LOW (ref 6.5–8.1)

## 2020-08-01 LAB — PROTIME-INR
INR: 1.1 (ref 0.8–1.2)
Prothrombin Time: 14 seconds (ref 11.4–15.2)

## 2020-08-01 LAB — MAGNESIUM: Magnesium: 1.8 mg/dL (ref 1.7–2.4)

## 2020-08-01 LAB — POTASSIUM: Potassium: 4 mmol/L (ref 3.5–5.1)

## 2020-08-01 IMAGING — DX DG THORACIC SPINE 2V
4 series · 4 of 4 positions shown · non-contrast
Comparison: None.

CLINICAL DATA: Upper back pain for several days, initial encounter

EXAM:
THORACIC SPINE 2 VIEWS

[t-spine lat]
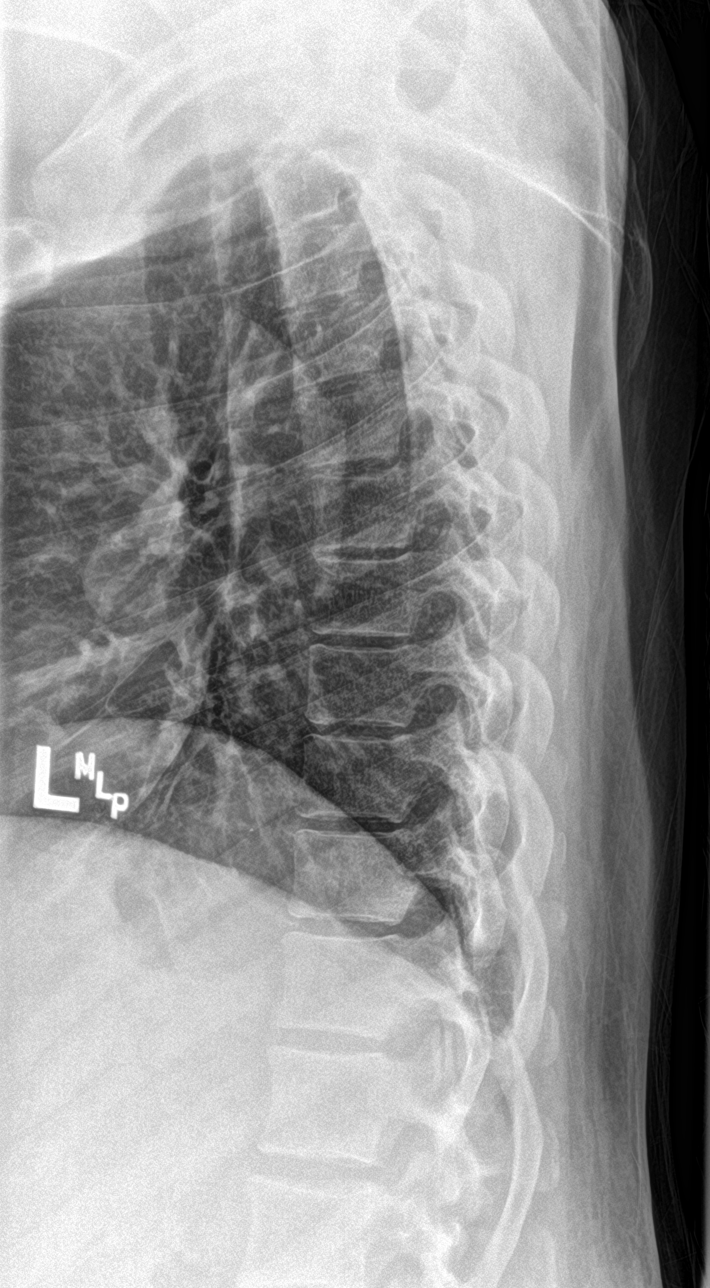

[t-spine swimmers (1 of 2)]
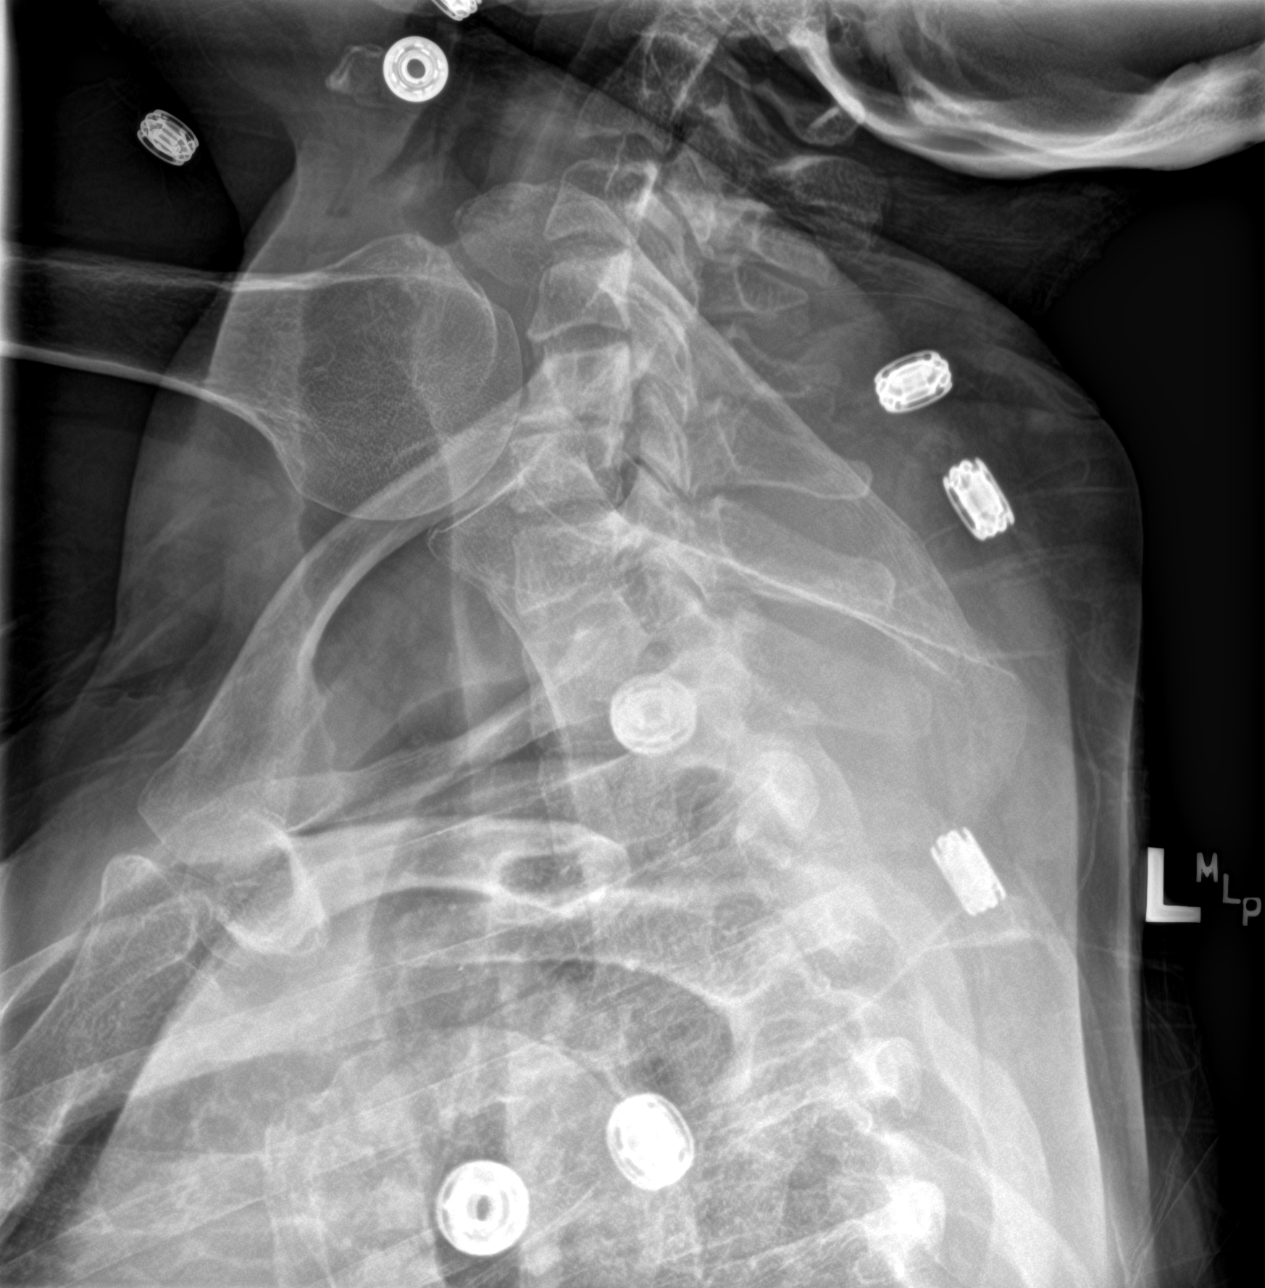

[t-spine ap]
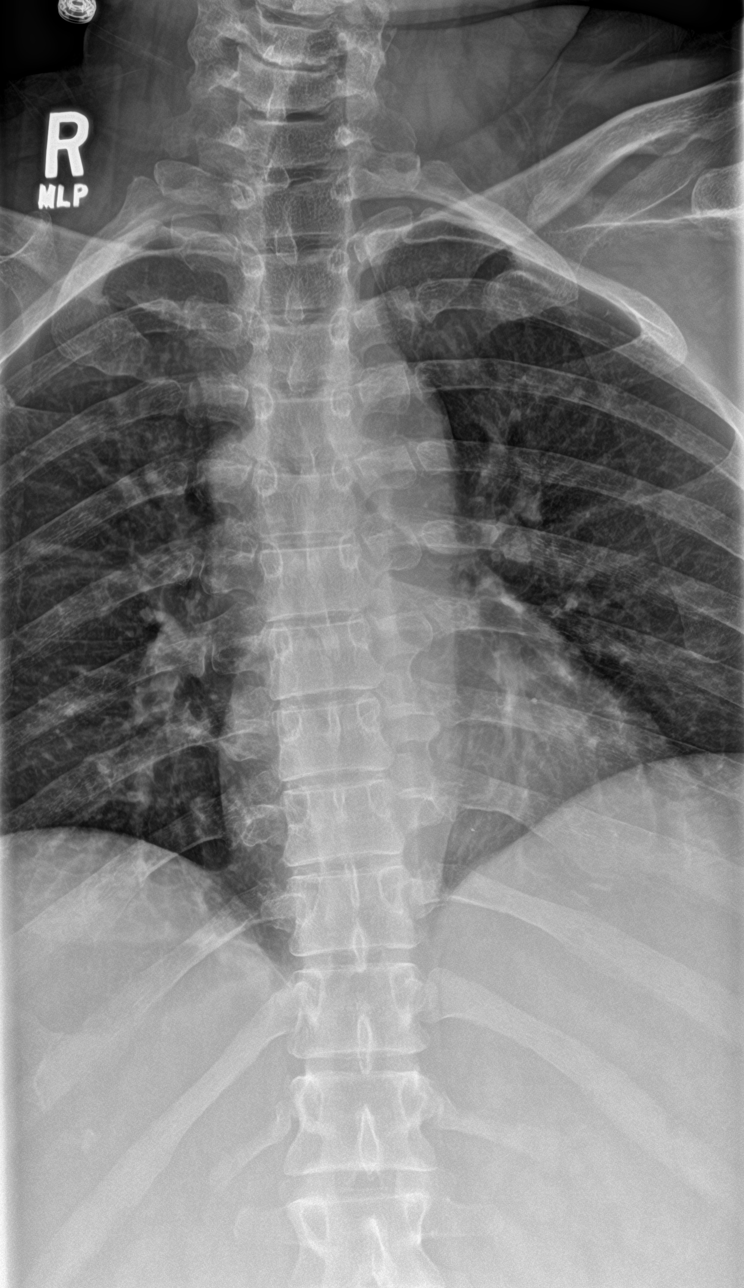

[t-spine swimmers (2 of 2)]
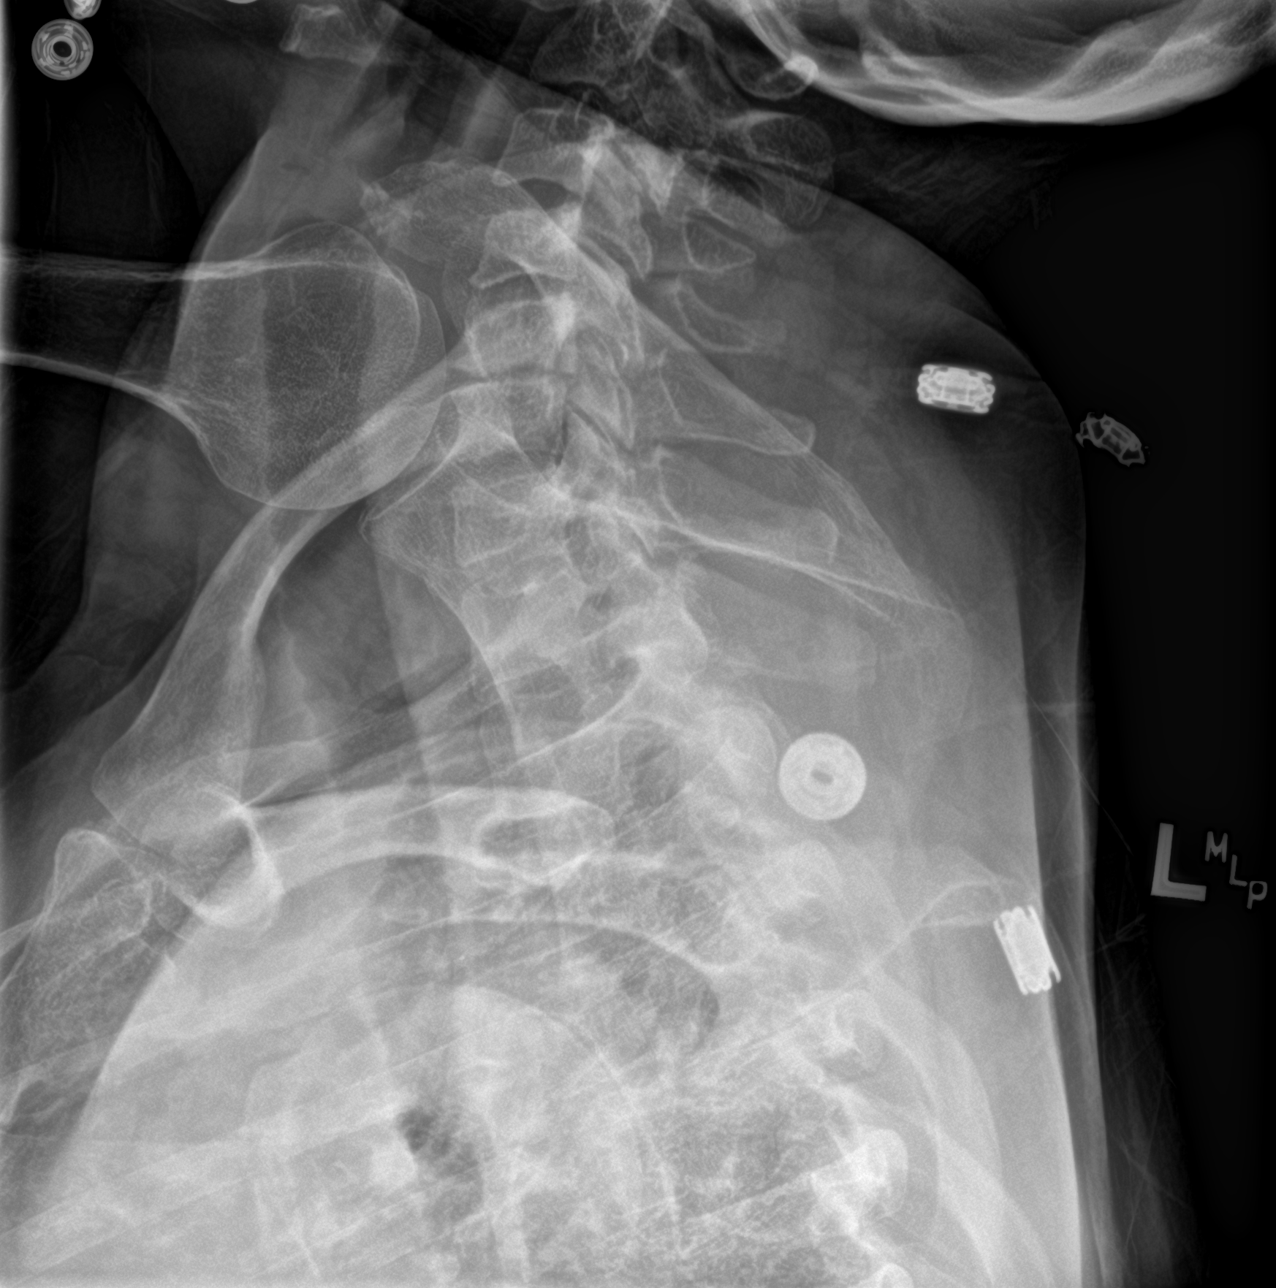

[4 of 4 positions shown; findings below may reference images not displayed]

FINDINGS: Vertebral body height is well maintained. No pedicle abnormality or
paraspinal mass is seen. Visualize ribcage is within normal limits.
IMPRESSION: No acute abnormality noted.

## 2020-08-01 MED ORDER — POTASSIUM CHLORIDE CRYS ER 20 MEQ PO TBCR
40.0000 meq | EXTENDED_RELEASE_TABLET | Freq: Once | ORAL | Status: AC
  Administered 2020-08-01: 40 meq via ORAL
  Filled 2020-08-01: qty 2

## 2020-08-01 MED ORDER — POTASSIUM CHLORIDE 10 MEQ/100ML IV SOLN
10.0000 meq | INTRAVENOUS | Status: AC
  Administered 2020-08-01 (×4): 10 meq via INTRAVENOUS
  Filled 2020-08-01 (×4): qty 100

## 2020-08-01 NOTE — Plan of Care (Signed)
Progressing. Focus on Lab results.

## 2020-08-01 NOTE — Progress Notes (Signed)
[facility] Gastroenterology Progress Note  LAQUESHIA CIHLAR 59 y.o. Mar 17, 1961  CC: Abnormal LFTs, lower extremity weakness   Subjective: Patient seen and examined at bedside. Back pain has improved significantly. Lower extremity weakness also improving. Denies abdominal pain, nausea and vomiting.  ROS : Afebrile, negative for chest pain   Objective: Vital signs in last 24 hours: Vitals:   07/31/20 2217 08/01/20 0507  BP: 139/75 119/64  Pulse: 78 72  Resp: 18 18  Temp: 98.3 F (36.8 C) 98.2 F (36.8 C)  SpO2: 99% 99%    Physical Exam:  General:  Alert, cooperative, no distress, appears stated age  Head:  Normocephalic, without obvious abnormality, atraumatic  Eyes:   Scleral icterus noted  Lungs:   Clear to auscultation bilaterally, respirations unlabored  Heart:  Regular rate and rhythm, S1, S2 normal  Abdomen:   Soft, non-tender, nondistended, bowel sounds present.  No peritoneal signs  Extremities:  no  edema  Psych:  Anxious    Lab Results: Recent Labs    07/31/20 0457 08/01/20 0443  NA 134* 134*  K 3.6 2.6*  CL 96* 97*  CO2 26 26  GLUCOSE 74 89  BUN 8 11  CREATININE 0.62 0.45  CALCIUM 8.7* 8.3*  MG 2.0 1.8   Recent Labs    07/31/20 0457 08/01/20 0443  AST 197* 165*  ALT 56* 46*  ALKPHOS 133* 132*  BILITOT 21.2* 17.3*  PROT 5.9* 4.9*  ALBUMIN 2.5* 2.0*   Recent Labs    07/31/20 0457  WBC 8.7  HGB 9.5*  HCT 29.8*  MCV 120.2*  PLT 247   Recent Labs    07/30/20 0502 08/01/20 0443  LABPROT 13.6 14.0  INR 1.1 1.1      Assessment/Plan: Assessment  ---------------  -Abnormal LFTs with significant jaundice. T Bili 21.2.  -Elevated iron saturation (88%), iron (191), decreased TIBC (216), and elevated ferritin (802).  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  ?? Alcoholic hepatitis. Patient admits drinking vodka on the weekends.  -Lower extremity weakness   - Elevated CPK.     Recommendations  -----------------------------  -Total bilirubin trending down again. -Discussed with Dr. Merrilee Jansky 07/31/2020.  Recommended to check for CMV and EBV PCR. Orders been placed. -Repeat CMP and INR in the morning. -Replace electrolytes   -Case was discussed with Dr. Laurier Nancy at [facility] hepatology 12/202021 Recommended evaluate for drug-induced liver injury. Also recommended to start prednisone 20 mg daily along with ursodiol 15 mg/kg.   -May consider increasing prednisone to 40 mg for possible alcoholic hepatitis.   -GI will follow.    Otis Brace MD, [place] 08/01/2020, 10:31 AM  Contact #  [phone]

## 2020-08-01 NOTE — Progress Notes (Signed)
CRITICAL VALUE ALERT  Critical Value: Potassium is 2.6  Date & Time Notied:  08/01/20;0553  Provider Notified: Yes  Orders Received/Actions taken: Awaiting any new orders.

## 2020-08-01 NOTE — Progress Notes (Signed)
PROGRESS NOTE    Judy Meyer  [id number] DOB: 10/30/1960 DOA: 07/17/2020 PCP: Pcp, No    Chief Complaint  Patient presents with  . Vomiting  . Abdominal Pain    Brief Narrative:  59 yo female with PMH hypothyroidism, hypertension, uterine fibroids, hyperlipidemia, depression who presented to the ER with worsening nausea and vomiting with jaundice, decreased strength, weight loss. On work-up she was found to have multiple lab abnormalities. Also underwent ultrasound abdomen which showed gallbladder stones and sludge. LFTs were elevated and she was also found to have iron excess.  GI was consulted on admission as well.General surgery also consulted and felt no need for surgical intervention at this time. Patient underwent ultrasound-guided liver biopsy which was consistent with severe hepatic steatosis and intrahepatic cholestasis. Bilirubin continues to rise. GI following.   Assessment & Plan:   Transaminitis/hyperbilirubinemia/right upper quadrant abdominal pain Questionable etiology. Right upper quadrant ultrasound done showed cholelithiasis and sludge but no evidence of acute cholecystitis. MRCP which was done was negative for any acute findings without evidence of gallstones, obstructive lesions or biliary dilatation. HIDA scan was unable to be done as total bilirubin noted to be elevated greater than 4.5 as per radiology.  Patient underwent liver biopsy which was consistent with severe hepatic steatosis with intrahepatic cholestasis with no clear explanation.  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   Acute hepatitis panel which was done was negative.  Patient noted to be on Nortrel for fibroids which she has taken for several years for dysfunctional uterine bleeding. Not sure not resumed during this hospitalization however per GI patient noted to have been taking her own supply in the room.  Gastroenterology has had  discussions with providers at [facility].  Started on prednisone and ursodiol.   Gastroenterology continues to follow and manage.   Bilirubin remains elevated.  Noted to be slightly better today.  Continues to have abdominal discomfort though she does not have any vomiting.  MRI abdomen done on 12/13 did not show any other acute findings. Management per gastroenterology.  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 Patient with volume overload on examination as such IV fluids discontinued.  CK levels have been trending downward.  CK was down to 337 on 07/2011.  Back pain Patient mentioned acute back pain yesterday morning.  She was tender over the lower thoracic spine area.  She was given pain medicines with good improvement.  No neurological weakness was noted.  States that she is feeling much better today.  Continues to have some tenderness in that area.  We will do a plain film to see if there is any obvious abnormality.  Otherwise patient does not have any neurological deficits.  She has been ambulating without difficulty.  The lower extremity weakness that she was experiencing previously was likely related to myopathy which appears to have improved.   Patient was told that if the x-ray does not show any acute findings and as long as her symptoms continue to improve we will not need to do any further testing.  If her symptoms do not resolve over the next 2 weeks then she may need to undergo MRI at that time   Bilateral lower extremity edema Secondary to aggressive fluid resuscitation also secondary to hypoalbuminemia. Patient noted to be +10 L during this hospitalization.  Patient was given IV Lasix for few days with good diuresis.  We will hold off on any further Lasix  today.  Potassium noted to be extremely low and will be repleted.   Iron excess Iron was noted to be 191.  Ferritin 802.  Percent saturation 88%.  No history of chronic  transfusions. Follow-up GI work-up, testing for hemochromatosis pending.   Hypothyroidism Synthroid.    Bacteria in urine Urine cultures negative.  Status post IV Rocephin.  Antibiotics discontinued.  Macrocytic anemia/folate deficiency B12 normal. Folate low.  Continue folic acid daily.  Hemoglobin has been stable.  Essential hypertension Continue Norvasc.  Blood pressure is reasonably well controlled.  Hyperlipidemia Statin on hold secondary to transaminitis.   Hypokalemia/hypomagnesemia Potassium to be repleted aggressively.  We will recheck levels this afternoon.  Magnesium 1.8.  Nondisplaced distal radial metaphyseal fracture Secondary to mechanical fall.  Case discussed with orthopedics who recommended Velcro wrist splint with outpatient follow-up.    History of dysfunctional uterine bleeding/fibroids Patient noted to be on Notrel and stated last time this was discontinued during the hospitalization she had significant uterine bleeding.  Concern that notrel may in part be contributing to her worsening transaminitis.  Previous rounding MD discussed with OB/GYN who felt that there is no medical therapy at this time to offer patient has not hormonally related and discussed with GI who would like to hold off on any additional medications at this time.  Will need outpatient follow-up with OB/GYN.     DVT prophylaxis: SCDs Code Status: Full Family Communication: Discussed with the patient Disposition: Waiting on improvement in liver function test.  Likely home when improved.  Status is: Inpatient    Dispo: The patient is from: Home              Anticipated d/c is to: Home              Anticipated d/c date is: To be determined.              Patient currently jaundice with hyperbilirubinemia unknown etiology with RUQ abdominal pain.  May need to be transferred to a tertiary care center.  Not stable for discharge.       Consultants:   General surgery: Dr. Barry Dienes  07-25-2020  Interventional radiology: Dr. Earleen Newport 07-22-2020  [facility]  Curb sided OB/GYN: Dr.Ervin 07/28/2020  Procedures:   CT abdomen and pelvis 07-17-2020  Plain films of the right wrist 07-26-2020  MRCP 07-21-2020  Right upper quadrant ultrasound 07-18-2020  Ultrasound-guided liver biopsy 07-22-2020 per Dr. Earleen Newport, IR  Antimicrobials:  IV Rocephin 07-18-2020 >>>> 07-22-2020   Subjective: Patient mentions that back pain is significantly improved.  Denies any weakness in her legs.  Continues to have some abdominal discomfort.  No shortness of breath   Objective: Vitals:   07/31/20 1505 07/31/20 2217 08/01/20 0507 08/01/20 0600  BP: 139/77 139/75 119/64   Pulse: 81 78 72   Resp: 18 18 18    Temp: 97.9 F (36.6 C) 98.3 F (36.8 C) 98.2 F (36.8 C)   TempSrc: Oral Oral Oral   SpO2: 100% 99% 99%   Weight:    80.6 kg  Height:        Intake/Output Summary (Last 24 hours) at 08/01/2020 1142 Last data filed at 08/01/2020 0800 Gross per 24 hour  Intake 1200 ml  Output 2100 ml  Net -900 ml   Filed Weights   07/30/20 0500 07/31/20 0450 08/01/20 0600  Weight: 84.3 kg 82.6 kg 80.6 kg    Examination:  General appearance: Awake alert.  In no distress Resp: Clear to auscultation bilaterally.  Normal effort Cardio: S1-S2 is normal regular.  No S3-S4.  No rubs murmurs or bruit GI: Abdomen is soft.  Some tenderness appreciated over the epigastrium as well as right upper quadrant without any rebound rigidity or guarding.  Back: Less tender over the lower thoracic spine area Extremities: No edema.  Full range of motion of lower extremities. Neurologic: Alert and oriented x3.  No focal neurological deficits.       Data Reviewed: I have personally reviewed following labs and imaging studies  CBC: Recent Labs  Lab 07/26/20 0405 07/27/20 0355 07/28/20 0428 07/28/20 1435 07/31/20 0457  WBC 5.5 5.1 27.3* 5.9 8.7  NEUTROABS 2.9  --  23.5* 3.6  --    HGB 9.5* 9.0* 8.9* 8.7* 9.5*  HCT 28.6* 27.4* 29.0* 26.6* 29.8*  MCV 119.2* 117.6* 92.1 117.7* 120.2*  PLT 169 209 133* 217 732    Basic Metabolic Panel: Recent Labs  Lab 07/27/20 0355 07/28/20 0428 07/28/20 1435 07/29/20 0415 07/30/20 0502 07/31/20 0457 08/01/20 0443  NA 130*   < > 132* 133* 133* 134* 134*  K 2.8*   < > 2.7* 3.5 3.4* 3.6 2.6*  CL 93*   < > 93* 95* 98 96* 97*  CO2 25   < > 27 26 22 26 26   GLUCOSE 92   < > 122* 111* 82 74 89  BUN <5*   < > <5* <5* <5* 8 11  CREATININE 0.62   < > 0.62 0.48 0.53 0.62 0.45  CALCIUM 8.3*   < > 8.4* 9.0 8.7* 8.7* 8.3*  MG 2.1  --  1.7 2.6*  --  2.0 1.8   < > = values in this interval not displayed.    GFR: Estimated Creatinine Clearance: 82 mL/min (by C-G formula based on SCr of 0.45 mg/dL).  Liver Function Tests: Recent Labs  Lab 07/28/20 1435 07/29/20 0415 07/30/20 0502 07/31/20 0457 08/01/20 0443  AST 173* 196* 172* 197* 165*  ALT 48* 57* 53* 56* 46*  ALKPHOS 127* 141* 125 133* 132*  BILITOT 18.0* 21.2* 18.3* 21.2* 17.3*  PROT 5.5* 6.1* 5.4* 5.9* 4.9*  ALBUMIN 2.2* 2.5* 2.3* 2.5* 2.0*     Radiology Studies: No results found.      Scheduled Meds: . amLODipine  5 mg Oral Daily  . feeding supplement  237 mL Oral Q24H  . folic acid  1 mg Oral Daily  . levothyroxine  75 mcg Oral Q0600  . polyethylene glycol  17 g Oral Daily  . predniSONE  20 mg Oral Q breakfast  . senna  1 tablet Oral BID  . thiamine  100 mg Oral Daily  . ursodiol  600 mg Oral BID  . venlafaxine XR  75 mg Oral Daily   Continuous Infusions:    LOS: 14 days      Bonnielee Haff, MD [facility]   To contact the attending provider between 7A-7P or the covering provider during after hours 7P-7A, please log into the web site [email or web address] and access using universal [place] password for that web site. If you do not have the password, please call the hospital operator.  08/01/2020, 11:42 AM

## 2020-08-01 NOTE — Progress Notes (Signed)
Physical Therapy Treatment Patient Details Name: Judy Meyer MRN: [id number] DOB: [birth year] Today's Date: 08/01/2020    History of Present Illness 59 y.o. female with medical history significant of fibroids, HTN, HLD, depression and admitted for nausea and abdominal pain, found to have Hyperbilirubinemia/ elevated LFT's. Pt reports fall in hospital while trying to wipe up the floor and RT wrist X-ray on 07/26/20 revealed Nondisplaced distal radial metaphyseal fracture and RT wrist splint ordered.    PT Comments    Pt ambulated in hallway and reports distance limited by weakness and fatigue.  Pt performed a few LE exercises in bed as well.   Follow Up Recommendations  Home health PT;Supervision for mobility/OOB     Equipment Recommendations  Rolling walker with 5" wheels    Recommendations for Other Services       Precautions / Restrictions Precautions Precautions: Fall Precaution Comments: Slipped on water in bathroom and now on fall precautions. Required Braces or Orthoses: Splint/Cast Splint/Cast: RT wrist Restrictions Weight Bearing Restrictions: Yes RUE Weight Bearing: Non weight bearing Other Position/Activity Restrictions: keeping R UE NWB until further clarification    Mobility  Bed Mobility Overal bed mobility: Modified Independent                Transfers Overall transfer level: Needs assistance Equipment used: None Transfers: Sit to/from Stand Sit to Stand: Supervision         General transfer comment: supervision for safety  Ambulation/Gait Ambulation/Gait assistance: Min guard Gait Distance (Feet): 100 Feet Assistive device: IV Pole Gait Pattern/deviations: Step-through pattern;Decreased stride length     General Gait Details: min/guard for safety, pt pushed IV pole for a little more stability today, fatigues quickly   Stairs             Wheelchair Mobility    Modified Rankin (Stroke Patients Only)       Balance                                             Cognition Arousal/Alertness: Awake/alert Behavior During Therapy: WFL for tasks assessed/performed Overall Cognitive Status: Within Functional Limits for tasks assessed                                        Exercises General Exercises - Lower Extremity Ankle Circles/Pumps: AROM;Both;10 reps Heel Slides: AROM;10 reps;Both Hip ABduction/ADduction: AROM;Both;10 reps Straight Leg Raises: AROM;Both;10 reps    General Comments        Pertinent Vitals/Pain Pain Assessment: No/denies pain Pain Intervention(s): Repositioned;Monitored during session    Home Living                      Prior Function            PT Goals (current goals can now be found in the care plan section) Progress towards PT goals: Progressing toward goals    Frequency    Min 3X/week      PT Plan Current plan remains appropriate    Co-evaluation              AM-PAC PT "6 Clicks" Mobility   Outcome Measure  Help needed turning from your back to your side while in a flat bed without using bedrails?: A Little Help needed moving  from lying on your back to sitting on the side of a flat bed without using bedrails?: A Little Help needed moving to and from a bed to a chair (including a wheelchair)?: A Little Help needed standing up from a chair using your arms (e.g., wheelchair or bedside chair)?: A Little Help needed to walk in hospital room?: A Little Help needed climbing 3-5 steps with a railing? : A Little 6 Click Score: 18    End of Session   Activity Tolerance: Patient tolerated treatment well Patient left: in bed;with call bell/phone within reach Nurse Communication: Mobility status PT Visit Diagnosis: Difficulty in walking, not elsewhere classified (R26.2);Muscle weakness (generalized) (M62.81)     Time: 6435-3912 PT Time Calculation (min) (ACUTE ONLY): 10 min  Charges:  $Gait Training: 8-22  mins                     Paulino Door, DPT Acute Rehabilitation Services Pager: [phone] Office: [phone]  Maida Sale E 08/01/2020, 12:56 PM

## 2020-08-02 LAB — COMPREHENSIVE METABOLIC PANEL
ALT: 58 U/L — ABNORMAL HIGH (ref 0–44)
AST: 166 U/L — ABNORMAL HIGH (ref 15–41)
Albumin: 2.4 g/dL — ABNORMAL LOW (ref 3.5–5.0)
Alkaline Phosphatase: 155 U/L — ABNORMAL HIGH (ref 38–126)
Anion gap: 11 (ref 5–15)
BUN: 10 mg/dL (ref 6–20)
CO2: 23 mmol/L (ref 22–32)
Calcium: 8.8 mg/dL — ABNORMAL LOW (ref 8.9–10.3)
Chloride: 101 mmol/L (ref 98–111)
Creatinine, Ser: 0.76 mg/dL (ref 0.44–1.00)
GFR, Estimated: >60 mL/min (ref >60)
Glucose, Bld: 85 mg/dL (ref 70–99)
Potassium: 3.5 mmol/L (ref 3.5–5.1)
Sodium: 135 mmol/L (ref 135–145)
Total Bilirubin: 20 mg/dL (ref 0.3–1.2)
Total Protein: 5.8 g/dL — ABNORMAL LOW (ref 6.5–8.1)

## 2020-08-02 LAB — PROTIME-INR
INR: 1.1 (ref 0.8–1.2)
Prothrombin Time: 14 seconds (ref 11.4–15.2)

## 2020-08-02 MED ORDER — PREDNISOLONE 5 MG PO TABS
40.0000 mg | ORAL_TABLET | Freq: Every day | ORAL | Status: DC
  Administered 2020-08-02 – 2020-08-06 (×5): 40 mg via ORAL
  Filled 2020-08-02 (×5): qty 8

## 2020-08-02 MED ORDER — NYSTATIN 100000 UNIT/ML MT SUSP
5.0000 mL | Freq: Four times a day (QID) | OROMUCOSAL | Status: DC
  Administered 2020-08-02 – 2020-08-06 (×19): 500000 [IU] via ORAL
  Filled 2020-08-02 (×17): qty 5

## 2020-08-02 MED ORDER — POTASSIUM CHLORIDE CRYS ER 20 MEQ PO TBCR
40.0000 meq | EXTENDED_RELEASE_TABLET | Freq: Once | ORAL | Status: AC
  Administered 2020-08-02: 40 meq via ORAL
  Filled 2020-08-02: qty 2

## 2020-08-02 MED ORDER — DIPHENHYDRAMINE HCL 25 MG PO CAPS
25.0000 mg | ORAL_CAPSULE | Freq: Four times a day (QID) | ORAL | Status: DC | PRN
  Administered 2020-08-02 – 2020-08-05 (×4): 25 mg via ORAL
  Filled 2020-08-02 (×4): qty 1

## 2020-08-02 NOTE — Progress Notes (Signed)
PROGRESS NOTE    Judy Meyer  ZDG:[id number] DOB: [birth year] DOA: 07/17/2020 PCP: Pcp, No    Chief Complaint  Patient presents with  . Vomiting  . Abdominal Pain    Brief Narrative:  59 yo female with PMH hypothyroidism, hypertension, uterine fibroids, hyperlipidemia, depression who presented to the ER with worsening nausea and vomiting with jaundice, decreased strength, weight loss. On work-up she was found to have multiple lab abnormalities. Also underwent ultrasound abdomen which showed gallbladder stones and sludge. LFTs were elevated and she was also found to have iron excess.  GI was consulted on admission as well.General surgery also consulted and felt no need for surgical intervention at this time. Patient underwent ultrasound-guided liver biopsy which was consistent with severe hepatic steatosis and intrahepatic cholestasis.    Assessment & Plan:   Transaminitis/hyperbilirubinemia/right upper quadrant abdominal pain Questionable etiology. Right upper quadrant ultrasound done showed cholelithiasis and sludge but no evidence of acute cholecystitis. MRCP which was done was negative for any acute findings without evidence of gallstones, obstructive lesions or biliary dilatation. HIDA scan was unable to be done as total bilirubin noted to be elevated greater than 4.5 as per radiology.  Patient underwent liver biopsy which was consistent with severe hepatic steatosis with intrahepatic cholestasis with no clear explanation.  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   Acute hepatitis panel which was done was negative.  Patient noted to be on Nortrel for fibroids which she has taken for several years for dysfunctional uterine bleeding. Not sure not resumed during this hospitalization however per GI patient noted to have been taking her own supply in the room.  Gastroenterology has had discussions with providers at [facility].   Started on prednisone and ursodiol.   Gastroenterology continues to follow and manage.  Bilirubin remains elevated.  It appears that her dose of prednisone has been increased today to 40 mg and she has been changed over to prednisolone from prednisone.  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 Patient with volume overload on examination as such IV fluids discontinued.  CK levels have been trending downward.  CK was down to 337 on 07/2011.  Back pain Patient mentioned acute back pain on the morning of 12/23.  She was tender over the lower thoracic spine area.  She was given pain medicines with good improvement.  No neurological weakness was noted.  States that she is feeling much better today.  Continues to have some tenderness in that area.  Plain film was done yesterday which did not show any acute findings.  This is likely musculoskeletal related to body position.  No neurological deficits noted.  She has been ambulating without difficulty.   The lower extremity weakness that she was experiencing previously was likely related to myopathy which appears to have improved.   Continue with analgesic agents and physical therapy.  If her symptoms do not resolve over the next 2 weeks then she may need to undergo MRI at that time.  No urgent indication for same at this time.  Bilateral lower extremity edema Secondary to aggressive fluid resuscitation also secondary to hypoalbuminemia. Patient noted to be +10 L during this hospitalization.  Patient was given IV Lasix for few days with good diuresis and improvement in edema.  We will hold off on any further Lasix for now.  TED stockings.  Iron excess Iron was noted to be 191.  Ferritin 802.  Percent saturation 88%.  No history of chronic transfusions. Follow-up GI work-up, testing for hemochromatosis pending.   Hypothyroidism Synthroid.    Bacteria in urine Urine cultures negative.  Status post IV  Rocephin.  Antibiotics discontinued.  Macrocytic anemia/folate deficiency B12 normal. Folate low.  Continue folic acid daily.  Hemoglobin has been stable.  Essential hypertension Continue Norvasc.  Blood pressure is reasonably well controlled.  Hyperlipidemia Statin on hold secondary to transaminitis.   Hypokalemia/hypomagnesemia Potassium improved but still on the lower side.  Additional doses will be given today.  Magnesium was 1.8 yesterday.    Nondisplaced distal radial metaphyseal fracture Secondary to mechanical fall.  Case discussed with orthopedics who recommended Velcro wrist splint with outpatient follow-up.    History of dysfunctional uterine bleeding/fibroids Patient noted to be on Notrel and stated last time this was discontinued during the hospitalization she had significant uterine bleeding.  Concern that notrel may in part be contributing to her worsening transaminitis.  Previous rounding MD discussed with OB/GYN who felt that there is no medical therapy at this time to offer patient has not hormonally related and discussed with GI who would like to hold off on any additional medications at this time.  Will need outpatient follow-up with OB/GYN.     DVT prophylaxis: SCDs Code Status: Full Family Communication: Discussed with the patient Disposition: Waiting on improvement in liver function test.  Likely home when improved.  Status is: Inpatient  Remains inpatient appropriate because:Inpatient level of care appropriate due to severity of illness   Dispo:  Patient From: Home  Planned Disposition: To be determined  Expected discharge date: 08/04/2020  Medically stable for discharge: No      Consultants:   General surgery: Dr. Barry Dienes 07-25-2020  Interventional radiology: Dr. Earleen Newport 07-22-2020  [facility]  Curb sided OB/GYN: Dr.Ervin 07/28/2020  Procedures:   CT abdomen and pelvis 07-17-2020  Plain films of the right wrist 07-26-2020  MRCP  07-21-2020  Right upper quadrant ultrasound 07-18-2020  Ultrasound-guided liver biopsy 07-22-2020 per Dr. Earleen Newport, IR  Antimicrobials:  IV Rocephin 07-18-2020 >>>> 07-22-2020   Subjective: Continues to have pain in the right upper abdomen.  Back pain has improved significantly.  No other new complaints offered.  She is quite upset about her long hospital stay.   Objective: Vitals:   08/01/20 1340 08/01/20 2042 08/02/20 0437 08/02/20 0446  BP: 126/69 (!) 141/81 137/66   Pulse: 72 69 71   Resp: 18 18 18    Temp: 98 F (36.7 C) (!) 97.5 F (36.4 C) 98.3 F (36.8 C)   TempSrc: Axillary Oral Oral   SpO2: 97% 95% 97%   Weight:    82.7 kg  Height:        Intake/Output Summary (Last 24 hours) at 08/02/2020 1125 Last data filed at 08/02/2020 1049 Gross per 24 hour  Intake 680 ml  Output --  Net 680 ml   Filed Weights   07/31/20 0450 08/01/20 0600 08/02/20 0446  Weight: 82.6 kg 80.6 kg 82.7 kg    Examination:  General appearance: Awake alert.  In no distress Resp: Clear to auscultation bilaterally.  Normal effort Cardio: S1-S2 is normal regular.  No S3-S4.  No rubs murmurs or bruit GI: Abdomen is soft.  Tender in the right upper abdomen without any rebound rigidity or guarding.  No masses organomegaly.   Extremities: Improved edema bilateral lower extremities.  Full range of motion. Neurologic: Alert and oriented x3.  No focal neurological deficits.  Data Reviewed: I have personally reviewed following labs and imaging studies  CBC: Recent Labs  Lab 07/27/20 0355 07/28/20 0428 07/28/20 1435 07/31/20 0457  WBC 5.1 27.3* 5.9 8.7  NEUTROABS  --  23.5* 3.6  --   HGB 9.0* 8.9* 8.7* 9.5*  HCT 27.4* 29.0* 26.6* 29.8*  MCV 117.6* 92.1 117.7* 120.2*  PLT 209 133* 217 [id number]    Basic Metabolic Panel: Recent Labs  Lab 07/27/20 0355 07/28/20 0428 07/28/20 1435 07/29/20 0415 07/30/20 0502 07/31/20 0457 08/01/20 0443 08/01/20 1449 08/02/20 0435  NA 130*    < > 132* 133* 133* 134* 134*  --  135  K 2.8*   < > 2.7* 3.5 3.4* 3.6 2.6* 4.0 3.5  CL 93*   < > 93* 95* 98 96* 97*  --  101  CO2 25   < > 27 26 22 26 26   --  23  GLUCOSE 92   < > 122* 111* 82 74 89  --  85  BUN <5*   < > <5* <5* <5* 8 11  --  10  CREATININE 0.62   < > 0.62 0.48 0.53 0.62 0.45  --  0.76  CALCIUM 8.3*   < > 8.4* 9.0 8.7* 8.7* 8.3*  --  8.8*  MG 2.1  --  1.7 2.6*  --  2.0 1.8  --   --    < > = values in this interval not displayed.    GFR: Estimated Creatinine Clearance: 83.1 mL/min (by C-G formula based on SCr of 0.76 mg/dL).  Liver Function Tests: Recent Labs  Lab 07/29/20 0415 07/30/20 0502 07/31/20 0457 08/01/20 0443 08/02/20 0435  AST 196* 172* 197* 165* 166*  ALT 57* 53* 56* 46* 58*  ALKPHOS 141* 125 133* 132* 155*  BILITOT 21.2* 18.3* 21.2* 17.3* 20.0*  PROT 6.1* 5.4* 5.9* 4.9* 5.8*  ALBUMIN 2.5* 2.3* 2.5* 2.0* 2.4*     Radiology Studies: DG Thoracic Spine 2 View  Result Date: 08/01/2020 CLINICAL DATA:  Upper back pain for several days, initial encounter EXAM: THORACIC SPINE 2 VIEWS COMPARISON:  None. FINDINGS: Vertebral body height is well maintained. No pedicle abnormality or paraspinal mass is seen. Visualize ribcage is within normal limits. IMPRESSION: No acute abnormality noted. Electronically Signed   By: Inez Catalina M.D.   On: 08/01/2020 13:04        Scheduled Meds: . amLODipine  5 mg Oral Daily  . feeding supplement  237 mL Oral Q24H  . folic acid  1 mg Oral Daily  . levothyroxine  75 mcg Oral Q0600  . nystatin  5 mL Oral QID  . polyethylene glycol  17 g Oral Daily  . prednisoLONE  40 mg Oral Daily  . senna  1 tablet Oral BID  . thiamine  100 mg Oral Daily  . ursodiol  600 mg Oral BID  . venlafaxine XR  75 mg Oral Daily   Continuous Infusions:    LOS: 15 days      Bonnielee Haff, MD [facility]   To contact the attending provider between 7A-7P or the covering provider during after hours 7P-7A, please log into  the web site [email or web address] and access using universal [place] password for that web site. If you do not have the password, please call the hospital operator.  08/02/2020, 11:25 AM

## 2020-08-02 NOTE — Progress Notes (Signed)
[facility] Gastroenterology Progress Note  TIAHNA CURE 59 y.o. 1961/05/28  CC: Abnormal LFTs, lower extremity weakness   Subjective: Patient seen and examined at bedside.  No acute GI issues overnight.  Complaining of mild right upper quadrant discomfort.  Denies diarrhea.  Denies blood in the stool  ROS : Afebrile, negative for chest pain   Objective: Vital signs in last 24 hours: Vitals:   08/01/20 2042 08/02/20 0437  BP: (!) 141/81 137/66  Pulse: 69 71  Resp: 18 18  Temp: (!) 97.5 F (36.4 C) 98.3 F (36.8 C)  SpO2: 95% 97%    Physical Exam:  General:  Alert, cooperative, no distress, appears stated age  Head:  Normocephalic, without obvious abnormality, atraumatic  Eyes:   Scleral icterus noted  Lungs:   Clear to auscultation bilaterally, respirations unlabored  Heart:  Regular rate and rhythm, S1, S2 normal  Abdomen:   Soft, non-tender, nondistended, bowel sounds present.  No peritoneal signs  Extremities:  no  edema  Psych:  Anxious    Lab Results: Recent Labs    07/31/20 0457 08/01/20 0443 08/01/20 1449 08/02/20 0435  NA 134* 134*  --  135  K 3.6 2.6* 4.0 3.5  CL 96* 97*  --  101  CO2 26 26  --  23  GLUCOSE 74 89  --  85  BUN 8 11  --  10  CREATININE 0.62 0.45  --  0.76  CALCIUM 8.7* 8.3*  --  8.8*  MG 2.0 1.8  --   --    Recent Labs    08/01/20 0443 08/02/20 0435  AST 165* 166*  ALT 46* 58*  ALKPHOS 132* 155*  BILITOT 17.3* 20.0*  PROT 4.9* 5.8*  ALBUMIN 2.0* 2.4*   Recent Labs    07/31/20 0457  WBC 8.7  HGB 9.5*  HCT 29.8*  MCV 120.2*  PLT 247   Recent Labs    08/01/20 0443 08/02/20 0435  LABPROT 14.0 14.0  INR 1.1 1.1      Assessment/Plan: Assessment  ---------------  -Abnormal LFTs with significant jaundice.  -Elevated iron saturation (88%), iron (191), decreased TIBC (216), and elevated ferritin (802).  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  ?? Alcoholic hepatitis. Patient admits drinking vodka on the weekends.  -Lower extremity weakness   - Elevated CPK.     Recommendations  -----------------------------  -Patient's total bilirubin is fluctuating. -Change prednisone 20 mg to prednisolone 40 mg -Repeat CMP  in the morning. -Replace electrolytes   -Case was discussed with Dr. Laurier Nancy at [facility] hepatology 12/202021 Recommended evaluate for drug-induced liver injury. Also recommended to start prednisone 20 mg daily along with ursodiol 15 mg/kg.   -Discussed with Dr. Merrilee Jansky 07/31/2020.  Recommended to check for CMV and EBV PCR.     -GI will follow.    Otis Brace MD, [place] 08/02/2020, 10:17 AM  Contact #  [phone]

## 2020-08-03 LAB — COMPREHENSIVE METABOLIC PANEL
ALT: 60 U/L — ABNORMAL HIGH (ref 0–44)
AST: 157 U/L — ABNORMAL HIGH (ref 15–41)
Albumin: 2.4 g/dL — ABNORMAL LOW (ref 3.5–5.0)
Alkaline Phosphatase: 153 U/L — ABNORMAL HIGH (ref 38–126)
Anion gap: 12 (ref 5–15)
BUN: 12 mg/dL (ref 6–20)
CO2: 25 mmol/L (ref 22–32)
Calcium: 8.9 mg/dL (ref 8.9–10.3)
Chloride: 101 mmol/L (ref 98–111)
Creatinine, Ser: 0.74 mg/dL (ref 0.44–1.00)
GFR, Estimated: >60 mL/min (ref >60)
Glucose, Bld: 92 mg/dL (ref 70–99)
Potassium: 3.5 mmol/L (ref 3.5–5.1)
Sodium: 138 mmol/L (ref 135–145)
Total Bilirubin: 21.4 mg/dL (ref 0.3–1.2)
Total Protein: 5.9 g/dL — ABNORMAL LOW (ref 6.5–8.1)

## 2020-08-03 LAB — CBC
HCT: 30.9 % — ABNORMAL LOW (ref 36.0–46.0)
Hemoglobin: 9.7 g/dL — ABNORMAL LOW (ref 12.0–15.0)
MCH: 37 pg — ABNORMAL HIGH (ref 26.0–34.0)
MCHC: 31.4 g/dL (ref 30.0–36.0)
MCV: 117.9 fL — ABNORMAL HIGH (ref 80.0–100.0)
Platelets: 316 10*3/uL (ref 150–400)
RBC: 2.62 MIL/uL — ABNORMAL LOW (ref 3.87–5.11)
RDW: 17.5 % — ABNORMAL HIGH (ref 11.5–15.5)
WBC: 14.8 10*3/uL — ABNORMAL HIGH (ref 4.0–10.5)
nRBC: 0.3 % — ABNORMAL HIGH (ref 0.0–0.2)

## 2020-08-03 LAB — MAGNESIUM: Magnesium: 2.2 mg/dL (ref 1.7–2.4)

## 2020-08-03 MED ORDER — ALUM & MAG HYDROXIDE-SIMETH 200-200-20 MG/5ML PO SUSP
30.0000 mL | ORAL | Status: DC | PRN
  Administered 2020-08-03: 30 mL via ORAL
  Filled 2020-08-03: qty 30

## 2020-08-03 MED ORDER — POTASSIUM CHLORIDE CRYS ER 20 MEQ PO TBCR
40.0000 meq | EXTENDED_RELEASE_TABLET | Freq: Every day | ORAL | Status: AC
  Administered 2020-08-03 – 2020-08-05 (×3): 40 meq via ORAL
  Filled 2020-08-03 (×3): qty 2

## 2020-08-03 MED ORDER — ENSURE ENLIVE PO LIQD
237.0000 mL | Freq: Three times a day (TID) | ORAL | Status: DC
  Administered 2020-08-03 – 2020-08-06 (×10): 237 mL via ORAL

## 2020-08-03 NOTE — Progress Notes (Signed)
[facility] Gastroenterology Progress Note  Judy Meyer 59 y.o. 1960-12-07  CC: Abnormal LFTs, lower extremity weakness   Subjective: Patient seen and examined at bedside.  No significant changes compared to yesterday.  Currently having regular bowel movements.  Continues to have generalized weakness with lower extremity weakness.   ROS : Afebrile, negative for chest pain   Objective: Vital signs in last 24 hours: Vitals:   08/02/20 2025 08/03/20 0509  BP: (!) 142/79 129/80  Pulse: 74 71  Resp: 20 12  Temp: 98 F (36.7 C) 97.7 F (36.5 C)  SpO2: 98% 97%    Physical Exam:  General:  Alert, cooperative, no distress, appears stated age  Head:  Normocephalic, without obvious abnormality, atraumatic  Eyes:   Scleral icterus noted  Lungs:   Clear to auscultation bilaterally, respirations unlabored  Heart:  Regular rate and rhythm, S1, S2 normal  Abdomen:   Soft, non-tender, nondistended, bowel sounds present.  No peritoneal signs  Extremities:  no  edema  Psych:  Anxious    Lab Results: Recent Labs    08/01/20 0443 08/01/20 1449 08/02/20 0435 08/03/20 0359  NA 134*  --  135 138  K 2.6*   < > 3.5 3.5  CL 97*  --  101 101  CO2 26  --  23 25  GLUCOSE 89  --  85 92  BUN 11  --  10 12  CREATININE 0.45  --  0.76 0.74  CALCIUM 8.3*  --  8.8* 8.9  MG 1.8  --   --  2.2   < > = values in this interval not displayed.   Recent Labs    08/02/20 0435 08/03/20 0359  AST 166* 157*  ALT 58* 60*  ALKPHOS 155* 153*  BILITOT 20.0* 21.4*  PROT 5.8* 5.9*  ALBUMIN 2.4* 2.4*   Recent Labs    08/03/20 0359  WBC 14.8*  HGB 9.7*  HCT 30.9*  MCV 117.9*  PLT 316   Recent Labs    08/01/20 0443 08/02/20 0435  LABPROT 14.0 14.0  INR 1.1 1.1      Assessment/Plan: Assessment  ---------------  -Abnormal LFTs with significant jaundice.  -Elevated iron saturation (88%), iron (191), decreased TIBC (216), and elevated ferritin (802).  -Hemochromastosis DNA suggests  patient is an unaffected carrier -Mildly low ceruloplasmin (17.2) -Acute hepatitis panel negative -ANA, AMA, and ASMA negative -No alpha 1 antitrypsin deficiency - MRI MRCP negative for acute changes.    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  ?? Alcoholic hepatitis. Patient admits drinking vodka on the weekends.  -Lower extremity weakness   - Elevated CPK.     Recommendations  -----------------------------  -Patient's total bilirubin is fluctuating. -Changed prednisone 20 mg to prednisolone 40 mg on 08/02/2020  -Repeat CMP and INR  in the morning. -Replace electrolytes -CMV and EBV PCR pending   -Case was discussed with Dr. Laurier Nancy at [facility] hepatology 12/202021 Recommended evaluate for drug-induced liver injury. Also recommended to start prednisone 20 mg daily along with ursodiol 15 mg/kg.   -Discussed with Dr. Merrilee Jansky 07/31/2020.  Recommended to check for CMV and EBV PCR.     -GI will follow.    Otis Brace MD, [place] 08/03/2020, 10:52 AM  Contact #  [phone]

## 2020-08-03 NOTE — Progress Notes (Signed)
PROGRESS NOTE    Judy Meyer  OZD:[id number] DOB: Mar 30, 1961 DOA: 07/17/2020 PCP: Pcp, No    Chief Complaint  Patient presents with  . Vomiting  . Abdominal Pain    Brief Narrative:  59 yo female with PMH hypothyroidism, hypertension, uterine fibroids, hyperlipidemia, depression who presented to the ER with worsening nausea and vomiting with jaundice, decreased strength, weight loss. On work-up she was found to have multiple lab abnormalities. Also underwent ultrasound abdomen which showed gallbladder stones and sludge. LFTs were elevated and she was also found to have iron excess.  GI was consulted on admission as well.General surgery also consulted and felt no need for surgical intervention at this time. Patient underwent ultrasound-guided liver biopsy which was consistent with severe hepatic steatosis and intrahepatic cholestasis.    Assessment & Plan:   Transaminitis/hyperbilirubinemia/right upper quadrant abdominal pain Questionable etiology. Right upper quadrant ultrasound done showed cholelithiasis and sludge but no evidence of acute cholecystitis. MRCP which was done was negative for any acute findings without evidence of gallstones, obstructive lesions or biliary dilatation. HIDA scan was unable to be done as total bilirubin noted to be elevated greater than 4.5 as per radiology.  Patient underwent liver biopsy which was consistent with severe hepatic steatosis with intrahepatic cholestasis with no clear explanation.  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   Acute hepatitis panel which was done was negative.  Patient noted to be on Nortrel for fibroids which she has taken for several years for dysfunctional uterine bleeding. Not sure not resumed during this hospitalization however per GI patient noted to have been taking her own supply in the room.  Gastroenterology has had discussions with providers at [facility]. Patient was started on steroids and ursodiol.  Bilirubin remains elevated.  Changed over to prednisolone on 12/25 and dose was increased.  Continue to monitor.  Gastroenterology continues to manage.   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 Patient with volume overload on examination as such IV fluids discontinued.  CK levels have been trending downward.  CK was down to 337 on 07/2011.  Back pain Patient mentioned acute back pain on the morning of 12/23.  She was tender over the lower thoracic spine area.  She was given pain medicines with good improvement.  No neurological weakness was noted.  States that she is feeling much better today.  Continues to have some tenderness in that area.  Plain film was done yesterday which did not show any acute findings.  This is likely musculoskeletal related to body position.  No neurological deficits noted.  She has been ambulating without difficulty.   The lower extremity weakness that she was experiencing previously was likely related to myopathy which appears to have improved.   Patient mentions that her back pain has completely resolved.  Continue as needed medications.  Bilateral lower extremity edema Secondary to aggressive fluid resuscitation also secondary to hypoalbuminemia. Patient noted to be +10 L during this hospitalization.  Patient was given IV Lasix for few days with good diuresis and improvement in edema.  Hold off on further doses of diuretics for now.  TED stockings.  Mobilize.    Iron excess Iron was noted to be 191.  Ferritin 802.  Percent saturation 88%.  No history of chronic transfusions. Testing for hemochromatosis pending.   Hypothyroidism Synthroid.    Bacteria in urine Urine cultures negative.  Status post IV Rocephin.  Antibiotics discontinued.  Macrocytic  anemia/folate deficiency B12 normal. Folate low.  Continue folic acid daily.  Hemoglobin has been  stable.  Essential hypertension Continue Norvasc.  Blood pressure is reasonably well controlled.  Hyperlipidemia Statin on hold secondary to transaminitis.   Hypokalemia/hypomagnesemia Continue to replace potassium.  Magnesium 2.2.    Nondisplaced distal radial metaphyseal fracture Secondary to mechanical fall.  Case discussed with orthopedics who recommended Velcro wrist splint with outpatient follow-up.    History of dysfunctional uterine bleeding/fibroids Patient noted to be on Notrel and stated last time this was discontinued during the hospitalization she had significant uterine bleeding.  Concern that notrel may in part be contributing to her worsening transaminitis.  Previous rounding MD discussed with OB/GYN who felt that there is no medical therapy at this time to offer patient has not hormonally related and discussed with GI who would like to hold off on any additional medications at this time.  Will need outpatient follow-up with OB/GYN.     DVT prophylaxis: SCDs Code Status: Full Family Communication: Discussed with the patient Disposition: Waiting on improvement in liver function test.  Likely home when improved.  Status is: Inpatient  Remains inpatient appropriate because:Inpatient level of care appropriate due to severity of illness   Dispo:  Patient From: Home  Planned Disposition: To be determined  Expected discharge date: 08/04/2020  Medically stable for discharge: No      Consultants:   General surgery: Dr. Barry Dienes 07-25-2020  Interventional radiology: Dr. Earleen Newport 07-22-2020  [facility] Gastroenterology  Curb sided OB/GYN: Dr.Ervin 07/28/2020  Procedures:   CT abdomen and pelvis 07-17-2020  Plain films of the right wrist 07-26-2020  MRCP 07-21-2020  Right upper quadrant ultrasound 07-18-2020  Ultrasound-guided liver biopsy 07-22-2020 per Dr. Earleen Newport, IR  Antimicrobials:  IV Rocephin 07-18-2020 >>>> 07-22-2020   Subjective: Continues to have  pain in the right upper abdomen but denies any nausea vomiting.  Back pain has resolved.  Objective: Vitals:   08/02/20 1432 08/02/20 2025 08/03/20 0500 08/03/20 0509  BP: 138/68 (!) 142/79  129/80  Pulse: 76 74  71  Resp: 17 20  12   Temp: 98.5 F (36.9 C) 98 F (36.7 C)  97.7 F (36.5 C)  TempSrc: Oral Oral  Oral  SpO2: 95% 98%  97%  Weight:   80.8 kg   Height:        Intake/Output Summary (Last 24 hours) at 08/03/2020 1028 Last data filed at 08/03/2020 0920 Gross per 24 hour  Intake 800 ml  Output 4125 ml  Net -3325 ml   Filed Weights   08/01/20 0600 08/02/20 0446 08/03/20 0500  Weight: 80.6 kg 82.7 kg 80.8 kg    Examination:  General appearance: Awake alert.  In no distress Resp: Clear to auscultation bilaterally.  Normal effort Cardio: S1-S2 is normal regular.  No S3-S4.  No rubs murmurs or bruit GI: Abdomen is soft.  Tender in the right upper quadrant the abdomen without any rebound rigidity or guarding.  No masses organomegaly.  Extremities: Improving edema.  Full range of motion of lower extremities. Neurologic: Alert and oriented x3.  No focal neurological deficits.         Data Reviewed: I have personally reviewed following labs and imaging studies  CBC: Recent Labs  Lab 07/28/20 0428 07/28/20 1435 07/31/20 0457 08/03/20 0359  WBC 27.3* 5.9 8.7 14.8*  NEUTROABS 23.5* 3.6  --   --   HGB 8.9* 8.7* 9.5* 9.7*  HCT 29.0* 26.6* 29.8* 30.9*  MCV 92.1 117.7* 120.2* 117.9*  PLT 133* 217 247 [id number]    Basic Metabolic Panel: Recent Labs  Lab 07/28/20 1435 07/29/20 0415 07/30/20 0502 07/31/20 0457 08/01/20 0443 08/01/20 1449 08/02/20 0435 08/03/20 0359  NA 132* 133* 133* 134* 134*  --  135 138  K 2.7* 3.5 3.4* 3.6 2.6* 4.0 3.5 3.5  CL 93* 95* 98 96* 97*  --  101 101  CO2 27 26 22 26 26   --  23 25  GLUCOSE 122* 111* 82 74 89  --  85 92  BUN <5* <5* <5* 8 11  --  10 12  CREATININE 0.62 0.48 0.53 0.62 0.45  --  0.76 0.74  CALCIUM 8.4* 9.0 8.7*  8.7* 8.3*  --  8.8* 8.9  MG 1.7 2.6*  --  2.0 1.8  --   --  2.2    GFR: Estimated Creatinine Clearance: 82.2 mL/min (by C-G formula based on SCr of 0.74 mg/dL).  Liver Function Tests: Recent Labs  Lab 07/30/20 0502 07/31/20 0457 08/01/20 0443 08/02/20 0435 08/03/20 0359  AST 172* 197* 165* 166* 157*  ALT 53* 56* 46* 58* 60*  ALKPHOS 125 133* 132* 155* 153*  BILITOT 18.3* 21.2* 17.3* 20.0* 21.4*  PROT 5.4* 5.9* 4.9* 5.8* 5.9*  ALBUMIN 2.3* 2.5* 2.0* 2.4* 2.4*     Radiology Studies: DG Thoracic Spine 2 View  Result Date: 08/01/2020 CLINICAL DATA:  Upper back pain for several days, initial encounter EXAM: THORACIC SPINE 2 VIEWS COMPARISON:  None. FINDINGS: Vertebral body height is well maintained. No pedicle abnormality or paraspinal mass is seen. Visualize ribcage is within normal limits. IMPRESSION: No acute abnormality noted. Electronically Signed   By: Inez Catalina M.D.   On: 08/01/2020 13:04        Scheduled Meds: . amLODipine  5 mg Oral Daily  . feeding supplement  237 mL Oral TID BM  . folic acid  1 mg Oral Daily  . levothyroxine  75 mcg Oral Q0600  . nystatin  5 mL Oral QID  . polyethylene glycol  17 g Oral Daily  . prednisoLONE  40 mg Oral Daily  . senna  1 tablet Oral BID  . thiamine  100 mg Oral Daily  . ursodiol  600 mg Oral BID  . venlafaxine XR  75 mg Oral Daily   Continuous Infusions:    LOS: 16 days      Bonnielee Haff, MD [facility]   To contact the attending provider between 7A-7P or the covering provider during after hours 7P-7A, please log into the web site [email or web address] and access using universal [place] password for that web site. If you do not have the password, please call the hospital operator.  08/03/2020, 10:28 AM

## 2020-08-04 LAB — COMPREHENSIVE METABOLIC PANEL
ALT: 53 U/L — ABNORMAL HIGH (ref 0–44)
AST: 133 U/L — ABNORMAL HIGH (ref 15–41)
Albumin: 2.3 g/dL — ABNORMAL LOW (ref 3.5–5.0)
Alkaline Phosphatase: 129 U/L — ABNORMAL HIGH (ref 38–126)
Anion gap: 11 (ref 5–15)
BUN: 13 mg/dL (ref 6–20)
CO2: 26 mmol/L (ref 22–32)
Calcium: 8.6 mg/dL — ABNORMAL LOW (ref 8.9–10.3)
Chloride: 100 mmol/L (ref 98–111)
Creatinine, Ser: 0.51 mg/dL (ref 0.44–1.00)
GFR, Estimated: >60 mL/min (ref >60)
Glucose, Bld: 59 mg/dL — ABNORMAL LOW (ref 70–99)
Potassium: 3.3 mmol/L — ABNORMAL LOW (ref 3.5–5.1)
Sodium: 137 mmol/L (ref 135–145)
Total Bilirubin: 18 mg/dL — ABNORMAL HIGH (ref 0.3–1.2)
Total Protein: 5.6 g/dL — ABNORMAL LOW (ref 6.5–8.1)

## 2020-08-04 LAB — CMV DNA, QUANTITATIVE, PCR
CMV DNA Quant: NEGATIVE IU/mL
Log10 CMV Qn DNA Pl: UNDETERMINED log10 IU/mL

## 2020-08-04 LAB — EPSTEIN BARR VRS(EBV DNA BY PCR)
EBV DNA QN by PCR: NEGATIVE copies/mL
log10 EBV DNA Qn PCR: UNDETERMINED log10 copy/mL

## 2020-08-04 LAB — PROTIME-INR
INR: 1.1 (ref 0.8–1.2)
Prothrombin Time: 14 seconds (ref 11.4–15.2)

## 2020-08-04 MED ORDER — SENNA 8.6 MG PO TABS: 1.0000 | ORAL_TABLET | Freq: Every day | ORAL | Status: DC | PRN

## 2020-08-04 MED ORDER — POTASSIUM CHLORIDE CRYS ER 20 MEQ PO TBCR
40.0000 meq | EXTENDED_RELEASE_TABLET | Freq: Once | ORAL | Status: AC
  Administered 2020-08-04: 40 meq via ORAL
  Filled 2020-08-04: qty 2

## 2020-08-04 MED ORDER — FAMOTIDINE 20 MG PO TABS
20.0000 mg | ORAL_TABLET | Freq: Every day | ORAL | Status: DC
  Administered 2020-08-04 – 2020-08-06 (×3): 20 mg via ORAL
  Filled 2020-08-04 (×3): qty 1

## 2020-08-04 NOTE — Progress Notes (Signed)
PROGRESS NOTE    Judy Meyer  QHU:[id number] DOB: [birth year] DOA: 07/17/2020 PCP: Pcp, No    Chief Complaint  Patient presents with  . Vomiting  . Abdominal Pain    Brief Narrative:  59 yo female with PMH hypothyroidism, hypertension, uterine fibroids, hyperlipidemia, depression who presented to the ER with worsening nausea and vomiting with jaundice, decreased strength, weight loss. On work-up she was found to have multiple lab abnormalities. Also underwent ultrasound abdomen which showed gallbladder stones and sludge. LFTs were elevated and she was also found to have iron excess.  GI was consulted on admission as well.General surgery also consulted and felt no need for surgical intervention at this time. Patient underwent ultrasound-guided liver biopsy which was consistent with severe hepatic steatosis and intrahepatic cholestasis.    Assessment & Plan:   Transaminitis/hyperbilirubinemia/right upper quadrant abdominal pain Questionable etiology. Right upper quadrant ultrasound done showed cholelithiasis and sludge but no evidence of acute cholecystitis. MRCP which was done was negative for any acute findings without evidence of gallstones, obstructive lesions or biliary dilatation. HIDA scan was unable to be done as total bilirubin noted to be elevated greater than 4.5 as per radiology.  Patient underwent liver biopsy which was consistent with severe hepatic steatosis with intrahepatic cholestasis with no clear explanation.  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   Acute hepatitis panel which was done was negative.  Patient noted to be on Nortrel for fibroids which she has taken for several years for dysfunctional uterine bleeding.  Patient had been noted during the earlier part of this hospitalization to be taking it on her own.  She was told not to do so. Gastroenterology has had discussions with providers at [facility]. Patient was started on steroids and ursodiol.  Bilirubin remains elevated.  Changed over to prednisolone on 12/25 and dose was increased.  Continue to monitor.  Gastroenterology continues to manage.   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 Patient with volume overload on examination as such IV fluids discontinued.  CK levels have been trending downward.  CK was down to 337 on 07/2011.  Lower extremity weakness and pain has significantly improved.  She is working with PT and OT.  Back pain Patient mentioned acute back pain on the morning of 12/23.  She was tender over the lower thoracic spine area.  She was given pain medicines with good improvement.  No neurological weakness was noted.  States that she is feeling much better today.  Continues to have some tenderness in that area.  Plain film was done yesterday which did not show any acute findings.  This is likely musculoskeletal related to body position.  No neurological deficits noted.  She has been ambulating without difficulty.   The lower extremity weakness that she was experiencing previously was likely related to myopathy which appears to have improved.   Patient mentions that her back pain has completely resolved.  Continue as needed medications. Considering that her back pain has completely resolved and her lower extremity weakness has significantly improved I do not believe that an inpatient neurological evaluation is warranted.  This can be pursued in the outpatient setting.  Bilateral lower extremity edema Secondary to aggressive fluid resuscitation also secondary to hypoalbuminemia. Patient noted to be +10 L during this hospitalization.  Patient was given IV Lasix for few days with good diuresis and improvement in edema.  Hold off on further doses of diuretics for  now.  TED stockings.  Mobilize.    Iron excess Iron was noted to be 191.  Ferritin 802.  Percent saturation  88%.  No history of chronic transfusions. Testing for hemochromatosis pending.   Hypothyroidism Synthroid.    Bacteria in urine Urine cultures negative.  Status post IV Rocephin.  Antibiotics discontinued.  Macrocytic anemia/folate deficiency B12 normal. Folate low.  Continue folic acid daily.  Hemoglobin has been stable.  Essential hypertension Continue Norvasc.  Blood pressure is reasonably well controlled.  Hyperlipidemia Statin on hold secondary to transaminitis.   Hypokalemia/hypomagnesemia Continue to replace potassium.  Magnesium 2.2.    Nondisplaced distal radial metaphyseal fracture Secondary to mechanical fall.  Case discussed with orthopedics who recommended Velcro wrist splint with outpatient follow-up.    History of dysfunctional uterine bleeding/fibroids Patient noted to be on Notrel and stated last time this was discontinued during the hospitalization she had significant uterine bleeding.  Concern that notrel may in part be contributing to her worsening transaminitis.  Previous rounding MD discussed with OB/GYN who felt that there is no medical therapy at this time to offer patient has not hormonally related and discussed with GI who would like to hold off on any additional medications at this time.  Will need outpatient follow-up with OB/GYN.     DVT prophylaxis: SCDs Code Status: Full Family Communication: Discussed with the patient Disposition: Waiting on improvement in liver function test.  Likely home when improved.  Status is: Inpatient  Remains inpatient appropriate because:Inpatient level of care appropriate due to severity of illness   Dispo:  Patient From: Home  Planned Disposition: Home  Expected discharge date: 08/07/2020  Medically stable for discharge: No      Consultants:   General surgery: Dr. Barry Dienes 07-25-2020  Interventional radiology: Dr. Earleen Newport 07-22-2020  [facility]  Curb sided OB/GYN: Dr.Ervin  07/28/2020  Procedures:   CT abdomen and pelvis 07-17-2020  Plain films of the right wrist 07-26-2020  MRCP 07-21-2020  Right upper quadrant ultrasound 07-18-2020  Ultrasound-guided liver biopsy 07-22-2020 per Dr. Earleen Newport, IR  Antimicrobials:  IV Rocephin 07-18-2020 >>>> 07-22-2020   Subjective: Continues to have pain in her abdomen with no significant change over the last few days.  No nausea vomiting.  Objective: Vitals:   08/03/20 1247 08/03/20 2226 08/04/20 0343 08/04/20 0459  BP: (!) 147/69 138/69  (!) 145/84  Pulse: 76 78  72  Resp: 16 18  16   Temp: 98.3 F (36.8 C) 98.6 F (37 C)  98.1 F (36.7 C)  TempSrc: Oral Oral  Oral  SpO2: 97% 95%  98%  Weight:   81.2 kg   Height:        Intake/Output Summary (Last 24 hours) at 08/04/2020 1141 Last data filed at 08/03/2020 1909 Gross per 24 hour  Intake --  Output 2000 ml  Net -2000 ml   Filed Weights   08/02/20 0446 08/03/20 0500 08/04/20 0343  Weight: 82.7 kg 80.8 kg 81.2 kg    Examination:   General appearance: Awake alert.  In no distress Resp: Clear to auscultation bilaterally.  Normal effort Cardio: S1-S2 is normal regular.  No S3-S4.  No rubs murmurs or bruit GI: Abdomen is soft.  Minimally tender today.  No masses organomegaly.  No rebound or guarding.   Extremities: No edema.  Full range of motion of lower extremities. Neurologic: Alert and oriented x3.  No focal neurological deficits.      Data Reviewed: I have personally reviewed following labs and imaging  studies  CBC: Recent Labs  Lab 07/28/20 1435 07/31/20 0457 08/03/20 0359  WBC 5.9 8.7 14.8*  NEUTROABS 3.6  --   --   HGB 8.7* 9.5* 9.7*  HCT 26.6* 29.8* 30.9*  MCV 117.7* 120.2* 117.9*  PLT 217 247 [id number]    Basic Metabolic Panel: Recent Labs  Lab 07/28/20 1435 07/29/20 0415 07/30/20 0502 07/31/20 0457 08/01/20 0443 08/01/20 1449 08/02/20 0435 08/03/20 0359 08/04/20 0336  NA 132* 133*   < > 134* 134*  --  135 138 137  K  2.7* 3.5   < > 3.6 2.6* 4.0 3.5 3.5 3.3*  CL 93* 95*   < > 96* 97*  --  101 101 100  CO2 27 26   < > 26 26  --  23 25 26   GLUCOSE 122* 111*   < > 74 89  --  85 92 59*  BUN <5* <5*   < > 8 11  --  10 12 13   CREATININE 0.62 0.48   < > 0.62 0.45  --  0.76 0.74 0.51  CALCIUM 8.4* 9.0   < > 8.7* 8.3*  --  8.8* 8.9 8.6*  MG 1.7 2.6*  --  2.0 1.8  --   --  2.2  --    < > = values in this interval not displayed.    GFR: Estimated Creatinine Clearance: 82.4 mL/min (by C-G formula based on SCr of 0.51 mg/dL).  Liver Function Tests: Recent Labs  Lab 07/31/20 0457 08/01/20 0443 08/02/20 0435 08/03/20 0359 08/04/20 0336  AST 197* 165* 166* 157* 133*  ALT 56* 46* 58* 60* 53*  ALKPHOS 133* 132* 155* 153* 129*  BILITOT 21.2* 17.3* 20.0* 21.4* 18.0*  PROT 5.9* 4.9* 5.8* 5.9* 5.6*  ALBUMIN 2.5* 2.0* 2.4* 2.4* 2.3*     Radiology Studies: No results found.      Scheduled Meds: . amLODipine  5 mg Oral Daily  . famotidine  20 mg Oral Daily  . feeding supplement  237 mL Oral TID BM  . folic acid  1 mg Oral Daily  . levothyroxine  75 mcg Oral Q0600  . nystatin  5 mL Oral QID  . polyethylene glycol  17 g Oral Daily  . potassium chloride  40 mEq Oral Daily  . potassium chloride  40 mEq Oral Once  . prednisoLONE  40 mg Oral Daily  . thiamine  100 mg Oral Daily  . ursodiol  600 mg Oral BID  . venlafaxine XR  75 mg Oral Daily   Continuous Infusions:    LOS: 17 days      Bonnielee Haff, MD [facility]   To contact the attending provider between 7A-7P or the covering provider during after hours 7P-7A, please log into the web site [email or web address] and access using universal [place] password for that web site. If you do not have the password, please call the hospital operator.  08/04/2020, 11:41 AM

## 2020-08-04 NOTE — Progress Notes (Signed)
Recent notes reviewed, including opinion of gynecologist, and that of my covering partner, Dr. Levora Angel, including institution of prednisone about a week ago, switched to prednisolone and a higher dose yesterday.  The patient admits to still using her Nortrel despite advice to the contrary.  Clinically, she is doing pretty well.  She did have some epigastric pain last night which went on for an hour and a half but which promptly resolved following a dose of Maalox.  She also is having frequent stools, on both MiraLAX and Senokot, but the stools are not loose or watery in character.  She indicates that her lower extremity weakness is much improved and she is doing quite a bit of walking in the halls, with a walker on standby support but she does not really need it.  On exam, the patient is her usual pleasant and fully coherent self, with moderately severe jaundice and some active pruritus.  Lower extremity edema has resolved and on exam of the abdomen, there is no evidence of ascites.  There is some subjective tenderness to palpation of the right epigastric area, without evident hepatomegaly.  Labs show improvement in bilirubin, 18 today versus 21.4 yesterday.  Impression:  1.  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 2.  Continued use of Nortrel against medical advice 3.  Gallstones, probably asymptomatic; no evidence of choledocholithiasis by MRI 4.  Upper abdominal pain last night, responsive to antacids, most likely reflux 5.  Dysfunctional uterine bleeding, no medical alternative to hormonal therapy available 6.  Constipation in the hospital, responsive to laxative regimen (in fact, slightly overcorrected at the moment) 7.  Lower extremity weakness, presumably resolving rhabdomyolysis  Plan:  1.  Continue to monitor bilirubin level  2.  If tomorrow's bilirubin is substantially improved, I think it would be okay for the patient  to remain on her Nortrel  3.  If the bilirubin tomorrow is back up, or not improved, the patient is aware that I am going to really lean on her to stop her Nortrel  4.  Decrease laxative regimen by making Senokot prn  5.  For now, continue prn antiacids for upper abdominal pain.  I would like to try to avoid adding another drug to her regimen (i.e., a PPI) if we can avoid it, even though PPIs typically do not cause any liver problems.  6.  I would continue to favor neurologic evaluation in view of bilateral lower extremity weakness (although it is improving), in the setting of recent unexplained muscle injury.  Florencia Reasons, M.D. Pager [phone] If no answer or after 5 PM call [phone]

## 2020-08-05 LAB — COMPREHENSIVE METABOLIC PANEL
ALT: 51 U/L — ABNORMAL HIGH (ref 0–44)
AST: 107 U/L — ABNORMAL HIGH (ref 15–41)
Albumin: 2 g/dL — ABNORMAL LOW (ref 3.5–5.0)
Alkaline Phosphatase: 133 U/L — ABNORMAL HIGH (ref 38–126)
Anion gap: 9 (ref 5–15)
BUN: 14 mg/dL (ref 6–20)
CO2: 26 mmol/L (ref 22–32)
Calcium: 8.4 mg/dL — ABNORMAL LOW (ref 8.9–10.3)
Chloride: 104 mmol/L (ref 98–111)
Creatinine, Ser: 0.69 mg/dL (ref 0.44–1.00)
GFR, Estimated: >60 mL/min (ref >60)
Glucose, Bld: 83 mg/dL (ref 70–99)
Potassium: 3.8 mmol/L (ref 3.5–5.1)
Sodium: 139 mmol/L (ref 135–145)
Total Bilirubin: 15.9 mg/dL — ABNORMAL HIGH (ref 0.3–1.2)
Total Protein: 5.1 g/dL — ABNORMAL LOW (ref 6.5–8.1)

## 2020-08-05 NOTE — Progress Notes (Signed)
PROGRESS NOTE    Judy Meyer  UEA:[id number] DOB: 1960-09-05 DOA: 07/17/2020 PCP: Pcp, No    Chief Complaint  Patient presents with  . Vomiting  . Abdominal Pain    Brief Narrative:  59 yo female with PMH hypothyroidism, hypertension, uterine fibroids, hyperlipidemia, depression who presented to the ER with worsening nausea and vomiting with jaundice, decreased strength, weight loss. On work-up she was found to have multiple lab abnormalities. Also underwent ultrasound abdomen which showed gallbladder stones and sludge. LFTs were elevated and she was also found to have iron excess.  GI was consulted on admission as well.General surgery also consulted and felt no need for surgical intervention at this time. Patient underwent ultrasound-guided liver biopsy which was consistent with severe hepatic steatosis and intrahepatic cholestasis.    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 MRCP which was done was negative for any acute findings without evidence of gallstones, obstructive lesions or biliary dilatation. HIDA scan was unable to be done as total bilirubin noted to be elevated greater than 4.5 as per radiology.  Patient underwent liver biopsy which was consistent with severe hepatic steatosis with intrahepatic cholestasis with no clear explanation.  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   Acute hepatitis panel which was done was negative.  Patient noted to be on Nortrel for fibroids which she has taken for several years for dysfunctional uterine bleeding.  Patient had been noted during the earlier part of this hospitalization to be taking it on her own.  She was told not to do so. Gastroenterology has had discussions with providers at [facility]. Patient was started on steroids and ursodiol.  Bilirubin remains elevated.  Changed over to prednisolone on 12/25 and dose was increased.  Bilirubin noted to be better today.  Gastroenterology continues to follow and manage.  If there is a continued downward trend hopefully she can be discharged soon.   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 Patient with volume overload on examination as such IV fluids discontinued.  CK levels have been trending downward.  CK was down to 337 on 07/2011.  Lower extremity weakness and pain has significantly improved.  She is working with PT and OT.  Back pain, resolved Patient mentioned acute back pain on the morning of 12/23.  She was tender over the lower thoracic spine area.  She was given pain medicines with good improvement.  No neurological weakness was noted.  States that she is feeling much better today.  Continues to have some tenderness in that area.  Plain film was done yesterday which did not show any acute findings.  This is likely musculoskeletal related to body position.  No neurological deficits noted.  She has been ambulating without difficulty.   The lower extremity weakness that she was experiencing previously was likely related to myopathy which appears to have improved.   Patient mentions that her back pain has completely resolved.  Continue as needed medications. Considering that her back pain has completely resolved and her lower extremity weakness has significantly improved I do not believe that an inpatient neurological evaluation is warranted.  This can be pursued in the outpatient setting.  Bilateral lower extremity edema Secondary to aggressive fluid resuscitation also secondary to hypoalbuminemia. Patient noted to be +10 L during this hospitalization.  Patient was  given IV Lasix for few days with good diuresis and improvement in edema.  Hold off on further doses of diuretics  for now.  TED stockings.  Mobilize.  Weight noted to be stable.  Iron excess Iron was noted to be 191.  Ferritin 802.  Percent saturation 88%.  No history of chronic transfusions. Testing for hemochromatosis pending.   Hypothyroidism Synthroid.    Bacteria in urine Urine cultures negative.  Status post IV Rocephin.  Antibiotics discontinued.  Macrocytic anemia/folate deficiency B12 normal. Folate low.  Continue folic acid daily.  Hemoglobin has been stable.  Essential hypertension Continue Norvasc.  Over the last 24 hours she is noted to have slightly more elevated blood pressure than before.  We will continue to monitor for now.  Could be due to steroids.  May need to increase the dose of amlodipine but will hold off for today.  Hyperlipidemia Statin on hold secondary to transaminitis.   Hypokalemia/hypomagnesemia Continue to replace potassium.  Magnesium 2.2.    Nondisplaced distal radial metaphyseal fracture Secondary to mechanical fall.  Case discussed with orthopedics who recommended Velcro wrist splint with outpatient follow-up.    History of dysfunctional uterine bleeding/fibroids Patient noted to be on Notrel and stated last time this was discontinued during the hospitalization she had significant uterine bleeding.  Concern that notrel may in part be contributing to her worsening transaminitis.  Previous rounding MD discussed with OB/GYN who felt that there is no medical therapy at this time to offer patient has not hormonally related and discussed with GI who would like to hold off on any additional medications at this time.  Will need outpatient follow-up with OB/GYN.     DVT prophylaxis: SCDs Code Status: Full Family Communication: Discussed with the patient Disposition: Waiting on improvement in liver function test.  Likely home when improved.  Status is: Inpatient  Remains inpatient appropriate because:Inpatient level of care appropriate due to severity of  illness   Dispo:  Patient From: Home  Planned Disposition: Home  Expected discharge date: 08/07/2020  Medically stable for discharge: No      Consultants:   General surgery: Dr. Barry Dienes 07-25-2020  Interventional radiology: Dr. Earleen Newport 07-22-2020  [facility]  Curb sided OB/GYN: Dr.Ervin 07/28/2020  Procedures:   CT abdomen and pelvis 07-17-2020  Plain films of the right wrist 07-26-2020  MRCP 07-21-2020  Right upper quadrant ultrasound 07-18-2020  Ultrasound-guided liver biopsy 07-22-2020 per Dr. Earleen Newport, IR  Antimicrobials:  IV Rocephin 07-18-2020 >>>> 07-22-2020   Subjective: Continues to have pain in the right upper quadrant abdomen without any nausea vomiting.  Denies any back pain anymore.  Objective: Vitals:   08/04/20 1246 08/04/20 2100 08/04/20 2139 08/05/20 0438  BP: (!) 142/70 (!) 148/78  (!) 150/83  Pulse: 72 71  75  Resp: 18 20  19   Temp: 98.3 F (36.8 C)  98.4 F (36.9 C) 98.6 F (37 C)  TempSrc: Oral  Oral Oral  SpO2: 100% 96%  96%  Weight:    80.3 kg  Height:        Intake/Output Summary (Last 24 hours) at 08/05/2020 1103 Last data filed at 08/05/2020 0951 Gross per 24 hour  Intake 480 ml  Output 2600 ml  Net -2120 ml   Filed Weights   08/03/20 0500 08/04/20 0343 08/05/20 0438  Weight: 80.8 kg 81.2 kg 80.3 kg    Examination:   General appearance: Awake alert.  In no distress Resp: Clear to auscultation bilaterally.  Normal effort Cardio:  S1-S2 is normal regular.  No S3-S4.  No rubs murmurs or bruit GI: Abdomen is soft.  Mildly tender in the right upper quadrant without any rebound rigidity or guarding.  No masses organomegaly Extremities: No edema.  Full range of motion of lower extremities. Neurologic: Alert and oriented x3.  No focal neurological deficits.     Data Reviewed: I have personally reviewed following labs and imaging studies  CBC: Recent Labs  Lab 07/31/20 0457 08/03/20 0359  WBC 8.7 14.8*   HGB 9.5* 9.7*  HCT 29.8* 30.9*  MCV 120.2* 117.9*  PLT 247 [id number]    Basic Metabolic Panel: Recent Labs  Lab 07/31/20 0457 08/01/20 0443 08/01/20 1449 08/02/20 0435 08/03/20 0359 08/04/20 0336 08/05/20 0428  NA 134* 134*  --  135 138 137 139  K 3.6 2.6* 4.0 3.5 3.5 3.3* 3.8  CL 96* 97*  --  101 101 100 104  CO2 26 26  --  23 25 26 26   GLUCOSE 74 89  --  85 92 59* 83  BUN 8 11  --  10 12 13 14   CREATININE 0.62 0.45  --  0.76 0.74 0.51 0.69  CALCIUM 8.7* 8.3*  --  8.8* 8.9 8.6* 8.4*  MG 2.0 1.8  --   --  2.2  --   --     GFR: Estimated Creatinine Clearance: 81.9 mL/min (by C-G formula based on SCr of 0.69 mg/dL).  Liver Function Tests: Recent Labs  Lab 08/01/20 0443 08/02/20 0435 08/03/20 0359 08/04/20 0336 08/05/20 0428  AST 165* 166* 157* 133* 107*  ALT 46* 58* 60* 53* 51*  ALKPHOS 132* 155* 153* 129* 133*  BILITOT 17.3* 20.0* 21.4* 18.0* 15.9*  PROT 4.9* 5.8* 5.9* 5.6* 5.1*  ALBUMIN 2.0* 2.4* 2.4* 2.3* 2.0*     Radiology Studies: No results found.      Scheduled Meds: . amLODipine  5 mg Oral Daily  . famotidine  20 mg Oral Daily  . feeding supplement  237 mL Oral TID BM  . folic acid  1 mg Oral Daily  . levothyroxine  75 mcg Oral Q0600  . nystatin  5 mL Oral QID  . polyethylene glycol  17 g Oral Daily  . prednisoLONE  40 mg Oral Daily  . thiamine  100 mg Oral Daily  . ursodiol  600 mg Oral BID  . venlafaxine XR  75 mg Oral Daily   Continuous Infusions:    LOS: 18 days      Bonnielee Haff, MD [facility]   To contact the attending provider between 7A-7P or the covering provider during after hours 7P-7A, please log into the web site [email or web address] and access using universal [place] password for that web site. If you do not have the password, please call the hospital operator.  08/05/2020, 11:03 AM

## 2020-08-05 NOTE — Plan of Care (Signed)
  Problem: Health Behavior/Discharge Planning: Goal: Ability to manage health-related needs will improve Outcome: Progressing   Problem: Clinical Measurements: Goal: Ability to maintain clinical measurements within normal limits will improve Outcome: Progressing Goal: Will remain free from infection Outcome: Progressing Goal: Diagnostic test results will improve Outcome: Progressing   

## 2020-08-05 NOTE — Progress Notes (Signed)
Further improvement in bilirubin noted, presumably in response to steroids. T. Bili 21.4->18->15.9 over the past 3 days.  These findings were reviewed with the patient, who is comfortable and in good spirits.  Impression:  Apparent response of cholestatic liver disease to prednisolone, despite persistent Nortrel use  Plan: Consider discharge tomorrow if the bilirubin tomorrow shows continued improvement.  The patient states that she feels like she is physically ready to go home, in terms of ability to ambulate, etc.  However, before she goes home, I would like to see a continuing trend toward improvement in her bilirubin.  Florencia Reasons, M.D. Pager [phone] If no answer or after 5 PM call [phone]

## 2020-08-05 NOTE — Progress Notes (Signed)
PT Cancellation Note  Patient Details Name: Judy Meyer MRN: [id number] DOB: Apr 06, 1961   Cancelled Treatment:     attempted to see x 2 AM        washing up and requested I come back this afternoon PM        "I'm waiting for the nurse to bring me some medicine"  Pt has been evaluated with rec for [facility] PT and a walker    Armando Reichert 08/05/2020, 3:20 PM

## 2020-08-06 DIAGNOSIS — R945 Abnormal results of liver function studies: Secondary | ICD-10-CM | POA: Diagnosis not present

## 2020-08-06 LAB — COMPREHENSIVE METABOLIC PANEL
ALT: 51 U/L — ABNORMAL HIGH (ref 0–44)
AST: 98 U/L — ABNORMAL HIGH (ref 15–41)
Albumin: 2.1 g/dL — ABNORMAL LOW (ref 3.5–5.0)
Alkaline Phosphatase: 132 U/L — ABNORMAL HIGH (ref 38–126)
Anion gap: 10 (ref 5–15)
BUN: 14 mg/dL (ref 6–20)
CO2: 25 mmol/L (ref 22–32)
Calcium: 8.2 mg/dL — ABNORMAL LOW (ref 8.9–10.3)
Chloride: 101 mmol/L (ref 98–111)
Creatinine, Ser: 0.65 mg/dL (ref 0.44–1.00)
GFR, Estimated: >60 mL/min (ref >60)
Glucose, Bld: 77 mg/dL (ref 70–99)
Potassium: 3.4 mmol/L — ABNORMAL LOW (ref 3.5–5.1)
Sodium: 136 mmol/L (ref 135–145)
Total Bilirubin: 15.7 mg/dL — ABNORMAL HIGH (ref 0.3–1.2)
Total Protein: 5.2 g/dL — ABNORMAL LOW (ref 6.5–8.1)

## 2020-08-06 LAB — CBC
HCT: 29.2 % — ABNORMAL LOW (ref 36.0–46.0)
Hemoglobin: 9.1 g/dL — ABNORMAL LOW (ref 12.0–15.0)
MCH: 36.5 pg — ABNORMAL HIGH (ref 26.0–34.0)
MCHC: 31.2 g/dL (ref 30.0–36.0)
MCV: 117.3 fL — ABNORMAL HIGH (ref 80.0–100.0)
Platelets: 299 10*3/uL (ref 150–400)
RBC: 2.49 MIL/uL — ABNORMAL LOW (ref 3.87–5.11)
RDW: 17.2 % — ABNORMAL HIGH (ref 11.5–15.5)
WBC: 13.2 10*3/uL — ABNORMAL HIGH (ref 4.0–10.5)
nRBC: 0.5 % — ABNORMAL HIGH (ref 0.0–0.2)

## 2020-08-06 MED ORDER — OXYCODONE HCL 5 MG PO TABS: 5.0000 mg | ORAL_TABLET | Freq: Four times a day (QID) | ORAL | 0 refills | Status: DC | PRN

## 2020-08-06 MED ORDER — THIAMINE HCL 100 MG PO TABS: 100.0000 mg | ORAL_TABLET | Freq: Every day | ORAL | 0 refills | Status: DC

## 2020-08-06 MED ORDER — AMLODIPINE BESYLATE 5 MG PO TABS: 5.0000 mg | ORAL_TABLET | Freq: Every day | ORAL | 1 refills | Status: DC

## 2020-08-06 MED ORDER — FOLIC ACID 1 MG PO TABS: 1.0000 mg | ORAL_TABLET | Freq: Every day | ORAL | 3 refills | Status: DC

## 2020-08-06 MED ORDER — FAMOTIDINE 20 MG PO TABS: 20.0000 mg | ORAL_TABLET | Freq: Every day | ORAL | 0 refills | Status: DC

## 2020-08-06 MED ORDER — POLYETHYLENE GLYCOL 3350 17 G PO PACK: 17.0000 g | PACK | Freq: Every day | ORAL | 0 refills | Status: DC

## 2020-08-06 MED ORDER — PREDNISOLONE 5 MG PO TABS: 40.0000 mg | ORAL_TABLET | Freq: Every day | ORAL | 1 refills | Status: DC

## 2020-08-06 MED ORDER — URSODIOL 300 MG PO CAPS: 600.0000 mg | ORAL_CAPSULE | Freq: Two times a day (BID) | ORAL | 1 refills | Status: DC

## 2020-08-06 MED ORDER — POTASSIUM CHLORIDE CRYS ER 20 MEQ PO TBCR
40.0000 meq | EXTENDED_RELEASE_TABLET | Freq: Once | ORAL | Status: AC
  Administered 2020-08-06: 40 meq via ORAL
  Filled 2020-08-06: qty 2

## 2020-08-06 NOTE — Plan of Care (Signed)
  Problem: Health Behavior/Discharge Planning: Goal: Ability to manage health-related needs will improve Outcome: Progressing   Problem: Clinical Measurements: Goal: Ability to maintain clinical measurements within normal limits will improve Outcome: Progressing   

## 2020-08-06 NOTE — Discharge Instructions (Signed)
Jaundice, Adult  Jaundice is when the skin, the whites of the eyes, and the lining of the mouth and nose (mucous membranes) turn a yellowish color. It is caused by having too much bilirubin in the blood. Bilirubin is made by the normal breakdown of red blood cells. Having jaundice means that your body's bile system may not be working as it should. The bile system is made up of the liver, the gallbladder, and the bile ducts. They work together to make, store, and move bile. Jaundice may be caused by drinking too much alcohol. It may also be caused by liver disease, infections, cancers, or some medicines. Your doctor may treat you with medicine, fluids, or surgery. Follow these instructions at home:   Take over-the-counter and prescription medicines only as told by your doctor.  You can use skin lotion to help with itching.  Drink plenty of fluids.  Do not drink alcohol.  Keep all follow-up visits as told by your doctor. This is important. Contact a doctor if:  You have a fever.  You have swelling or pain in your belly (abdomen). Get help right away if:  Your symptoms get worse all of a sudden.  Your pain gets worse.  You keep throwing up (vomiting).  You throw up blood.  You become weak or confused.  You get a very bad headache.  You have blood in your poop.  You lose too much body fluid (dehydration). Signs that have you lost too much body fluid include: ? A very dry mouth. ? A fast, weak pulse. ? Fast breathing. ? Blue lips. Summary  Jaundice is when the skin, the whites of the eyes, and the lining of the mouth and nose turn a yellowish color.  Jaundice may be caused by a problem in the liver, the gallbladder, and the bile ducts.  Follow your doctor's instructions for home care. These include drinking plenty of fluid, not drinking alcohol, and taking medicines as told.  Get help right away if your symptoms get worse, you feel weak or confused, you throw up, you  have blood in your poop, you have a very bad headache, or you lose too much body fluid. This information is not intended to replace advice given to you by your health care provider. Make sure you discuss any questions you have with your health care provider. Document Revised: 07/29/2017 Document Reviewed: 07/29/2017 Elsevier Patient Education  2020 [employer]  

## 2020-08-06 NOTE — Progress Notes (Signed)
Pt discharged to home, instructions reviewed with pt , acknowledged understanding of instructions. SRP, RN °

## 2020-08-06 NOTE — Plan of Care (Signed)
  Problem: Health Behavior/Discharge Planning: Goal: Ability to manage health-related needs will improve 08/06/2020 1123 by Charmian Muff, RN Outcome: Progressing 08/06/2020 1122 by Charmian Muff, RN Outcome: Progressing

## 2020-08-06 NOTE — TOC Progression Note (Signed)
Transition of Care [facility]) - Progression Note    Patient Details  Name: Judy Meyer MRN: [id number] Date of Birth: Jul 26, 1961  [facility] CM/SW Contact  Geni Bers, RN Phone Number: 08/06/2020, 11:47 AM  Clinical Narrative:    Spoke with pt to make her aware that [facility] will not start until Monday related to Holiday. Pt was okay with that. Pt has received her RW. TOC will sign off.    Expected Discharge Plan: Home w Home Health Services Barriers to Discharge: No Barriers Identified  Expected Discharge Plan and Services Expected Discharge Plan: Home w Home Health Services       Living arrangements for the past 2 months: Single Family Home                                       Social Determinants of Health (SDOH) Interventions    Readmission Risk Interventions No flowsheet data found.

## 2020-08-06 NOTE — Progress Notes (Signed)
The patient continues to feel satisfactory, and ready to go home.  It is noted that today's bilirubin level is essentially unchanged from yesterday, so it is possible that her recent improvement over the past several days has "plateaued."  I do feel that the patient is clinically stable for discharge.    GI Issues:  1.  Severe jaundice, somewhat improved on medical therapy  2. RUQ pain, no longer a significant problem, etiology unclear  3. Gallstones, presumably silent (MRCP negative for choledocholithiasis); not felt to be in need of cholecystectomy, per general surgery consultation  4.  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  Elevated iron studies with carrier status on hemochromatosis DNA testing, as found in the morning of 9 Cauc[place], not likely to lead to clinically significant iron overload.  6.  Severely elevated CK level at time of admission, with progressive improvement during hospitalization, etiology never determined  7.  Acute lower extremity weakness, with transient inability to walk, improved during hospitalization  8.  Dysfunctional uterine bleeding, unwilling to stop Nortrel hormonal therapy  9.  Irregularity of bowel habit, requiring laxatives during hospitalization    Recommendations:  1.  I would continue the patient's current inpatient medications for her liver, specifically, prednisolone 40 mg daily, and ursodiol 600 mg bid.  May continue Miralax prn as outpatient as needed for constipation.  2.  I have made arrangements for the patient to be seen by our physician assistant in the office on January 6 at 10 a.m.   Prior to that visit, we will be obtaining updated LFTs  3.  We are in the process of arranging a consultation at the [facility] here in [place]  4.  No restriction on diet or activity    Please call me if you have any questions.  Florencia Reasons, M.D. Pager [phone] If no answer or after 5 PM  call [phone]

## 2020-08-06 NOTE — Discharge Summary (Signed)
[facility]  Physician Discharge Summary   Patient ID: Judy Meyer MRN: [id number] DOB/AGE: 08-16-1960 59 y.o.  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 Follow-up with gastroenterology in the outpatient setting.  Appointment has been made. 2. Outpatient follow-up with PCP 3. Needs to follow-up with orthopedics for her wrist injury as well as with GYN for her history of DUB and fibroids    Home Health: PT and OT Equipment/Devices: None  CODE STATUS: Full code  DISCHARGE CONDITION: fair  Diet recommendation: As before  INITIAL HISTORY: 59 yo female with PMH hypothyroidism, hypertension, uterine fibroids, hyperlipidemia, depression who presented to the ER with worsening nausea and vomitingwith jaundice, decreased strength, weight loss. On work-up she was found to have multiple lab abnormalities. Also underwent ultrasound abdomen which showed gallbladder stones and sludge. LFTs were elevated and she was also found to have iron excess.  GI was consulted on admission as well.General surgery also consulted and felt no need for surgical intervention at this time. Patient underwent ultrasound-guided liver biopsy which was consistent with severe hepatic steatosis and intrahepatic cholestasis.      Consultants:   General surgery: Dr. Barry Dienes 07-25-2020  Interventional radiology: Dr. Earleen Newport 07-22-2020  [facility] Gastroenterology  Curb sided OB/GYN: Dr.Ervin 07/28/2020  Procedures:   CT abdomen and pelvis 07-17-2020  Plain films of the right wrist 07-26-2020  MRCP  07-21-2020  Right upper quadrant ultrasound 07-18-2020  Ultrasound-guided liver biopsy 07-22-2020 per Dr. 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 MRCP which was done was negative for any acute findings without evidence of gallstones, obstructive lesions or biliary dilatation. HIDA scan was unable to be done as total bilirubin noted to be elevated greater than 4.5 as per radiology.  Patient underwent liver biopsy which was consistent with severe hepatic steatosis with intrahepatic cholestasis with no clear explanation.  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   Acute hepatitis panel which was done was negative.  Patient noted to be on Nortrel for fibroids which she has taken for several years for dysfunctional uterine bleeding.  Patient had been noted during the earlier part of this hospitalization to be taking it on her own.  She was told not to do so. Gastroenterology has had discussions with providers at [facility]. Patient was started on steroids and ursodiol.  Bilirubin remains elevated.  Changed over to prednisolone on 12/25 and dose was increased.  Bilirubin has improved.  Stable today compared to yesterday.  Patient feels better.  Has been ambulating without difficulty.  Discussed with gastroenterology today.  They have evaluated the patient.  Cleared for discharge on prednisolone and ursodiol. They will arrange outpatient follow-up.  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 Patient with volume overload on examination as such IV fluids discontinued.  CK levels have been trending downward.  CK was down to 337 on 07/2011.  Lower extremity weakness and pain has significantly improved.  Health  has been ordered.  Back pain, resolved Patient mentioned acute back pain on the morning of 12/23.  She was tender over the lower thoracic spine area.  She was given pain medicines with good improvement.  No neurological weakness was noted.  States that she is feeling much better today.  Continues to have some tenderness in that area.  Plain film was done yesterday which did not show any acute findings.  This is likely musculoskeletal related to body position.  No neurological deficits noted.  She has been ambulating without difficulty.   The lower extremity weakness that she was experiencing previously was likely related to myopathy which appears to have improved.   Patient mentions that her back pain has completely resolved.  Continue as needed medications. Considering that her back pain has completely resolved and her lower extremity weakness has significantly improved I do not believe that an inpatient neurological evaluation is warranted.  This can be pursued in the outpatient setting.  Bilateral lower extremity edema Secondary to aggressive fluid resuscitation also secondary to hypoalbuminemia. Patient noted to be +10 L during this hospitalization.  Patient was given IV Lasix for few days with good diuresis and improvement in edema.    Hypothyroidism Continue home medications   Bacteria in urine Urine cultures negative.  Status post IV Rocephin.   Macrocytic anemia/folate deficiency Continue folic acid  Essential hypertension Continue Norvasc.    Pain likely contributing to increase in blood pressure along with steroids.  May need further adjustment to dosage in the outpatient setting.  Hyperlipidemia Statin on hold secondary to transaminitis.   Hypokalemia/hypomagnesemia   Nondisplaced distal radial metaphyseal fracture Secondary to mechanical fall.  Case discussed with orthopedics who recommended Velcro wrist splint with outpatient follow-up.    History of  dysfunctional uterine bleeding/fibroids Patient noted to be on Notrel and stated last time this was discontinued during the hospitalization she had significant uterine bleeding.  Concern that notrel may in part be contributing to her worsening transaminitis.  Previous rounding MD discussed with OB/GYN who felt that there is no medical therapy at this time to offer patient has not hormonally related and discussed with GI who would like to hold off on any additional medications at this time.  Will need outpatient follow-up with OB/GYN.    Moderate malnutrition Nutrition Problem: Moderate Malnutrition Etiology: acute illness,nausea,vomiting  Signs/Symptoms: moderate fat depletion,moderate muscle depletion,energy intake < or equal to 75% for > or equal to 1 month  Interventions: Ensure Enlive (each supplement provides 350kcal and 20 grams of protein)   Patient remains stable.  She feels better from physical deconditioning standpoint.  Bilirubin is also better compared to last week.  Okay for discharge today.  Cleared by gastroenterology.   PERTINENT LABS:  The results of significant diagnostics from this hospitalization (including imaging, microbiology, ancillary and laboratory) are listed below for reference.      Labs:  COVID-19 Labs   Lab Results  Component Value Date   SARSCOV2NAA NEGATIVE 07/18/2020   [place] NEGATIVE 04/06/2020      Basic Metabolic Panel: Recent Labs  Lab 07/31/20 0457 08/01/20 0443 08/01/20 1449 08/02/20 0435 08/03/20 0359 08/04/20 0336 08/05/20 0428 08/06/20 0407  NA 134* 134*  --  135 138 137 139 136  K 3.6 2.6*   < > 3.5 3.5 3.3* 3.8 3.4*  CL 96* 97*  --  101 101 100 104 101  CO2 26 26  --  23 25 26 26 25   GLUCOSE 74 89  --  85 92 59* 83 77  BUN 8 11  --  10 12 13  14  14  CREATININE 0.62 0.45  --  0.76 0.74 0.51 0.69 0.65  CALCIUM 8.7* 8.3*  --  8.8* 8.9 8.6* 8.4* 8.2*  MG 2.0 1.8  --   --  2.2  --   --   --    < > = values in this  interval not displayed.   Liver Function Tests: Recent Labs  Lab 08/02/20 0435 08/03/20 0359 08/04/20 0336 08/05/20 0428 08/06/20 0407  AST 166* 157* 133* 107* 98*  ALT 58* 60* 53* 51* 51*  ALKPHOS 155* 153* 129* 133* 132*  BILITOT 20.0* 21.4* 18.0* 15.9* 15.7*  PROT 5.8* 5.9* 5.6* 5.1* 5.2*  ALBUMIN 2.4* 2.4* 2.3* 2.0* 2.1*   CBC: Recent Labs  Lab 07/31/20 0457 08/03/20 0359 08/06/20 0407  WBC 8.7 14.8* 13.2*  HGB 9.5* 9.7* 9.1*  HCT 29.8* 30.9* 29.2*  MCV 120.2* 117.9* 117.3*  PLT 247 316 299     IMAGING STUDIES DG Thoracic Spine 2 View  Result Date: 08/01/2020 CLINICAL DATA:  Upper back pain for several days, initial encounter EXAM: THORACIC SPINE 2 VIEWS COMPARISON:  None. FINDINGS: Vertebral body height is well maintained. No pedicle abnormality or paraspinal mass is seen. Visualize ribcage is within normal limits. IMPRESSION: No acute abnormality noted. Electronically Signed   By: Inez Catalina M.D.   On: 08/01/2020 13:04   DG Wrist 2 Views Right  Result Date: 07/26/2020 CLINICAL DATA:  [place] on outstretched hand. Right wrist pain. Initial encounter. EXAM: RIGHT WRIST - 2 VIEW COMPARISON:  None. FINDINGS: Nondisplaced fracture is seen involving distal radial metaphysis. No other acute fractures identified. No evidence of dislocation. Internal fixation screws noted in the 5th metacarpal shaft. IMPRESSION: Nondisplaced distal radial metaphyseal fracture. Electronically Signed   By: Marlaine Hind M.D.   On: 07/26/2020 05:59   CT Abdomen Pelvis W Contrast  Result Date: 07/17/2020 CLINICAL DATA:  Abdominal pain and vomiting EXAM: CT ABDOMEN AND PELVIS WITH CONTRAST TECHNIQUE: Multidetector CT imaging of the abdomen and pelvis was performed using the standard protocol following bolus administration of intravenous contrast. CONTRAST:  195mL OMNIPAQUE IOHEXOL 300 MG/ML  SOLN COMPARISON:  April 06, 2020 FINDINGS: Lower chest: The visualized heart size within normal limits. No  pericardial fluid/thickening. No hiatal hernia. The visualized portions of the lungs are clear. Hepatobiliary: There is diffuse low density seen throughout the liver parenchyma with mild hepatomegaly.The main portal vein is patent. The gallbladder is mildly fluid-filled and distended. No intrahepatic biliary ductal dilatation is noted. Pancreas: Unremarkable. No pancreatic ductal dilatation or surrounding inflammatory changes. Spleen: Normal in size without focal abnormality. Adrenals/Urinary Tract: Both adrenal glands appear normal. Within the lower pole of the right kidney again noted is a 2.1 cm fat containing lesion, likely angiomyolipoma. There is a 3 mm calculus in the lower pole of the right kidney. No hydronephrosis. The bladder is partially decompressed. Stomach/Bowel: The stomach, small bowel, and colon are normal in appearance. No inflammatory changes, wall thickening, or obstructive findings.The appendix is normal. Vascular/Lymphatic: There are no enlarged mesenteric, retroperitoneal, or pelvic lymph nodes. Scattered mild aortic atherosclerosis is seen. Reproductive: Multiple heterogeneously enhancing uterine fibroids are again identified with the large uterus. Some of which appear to be partially calcified. Other: No evidence of abdominal wall mass or hernia. Musculoskeletal: No acute or significant osseous findings. IMPRESSION: Hepatic steatosis. Mildly distended fluid-filled gallbladder, however no definite evidence of surrounding inflammatory changes. Stable 2 cm right-sided renal angiomyolipoma Nonobstructing right renal calculus. Enlarged uterus with multiple uterine fibroids. Aortic Atherosclerosis (  ICD10-I70.0). Electronically Signed   By: Jonna Clark M.D.   On: 07/17/2020 22:51   MR 3D Recon At Scanner  Result Date: 07/21/2020 CLINICAL DATA:  Right upper quadrant pain, nondiagnostic ultrasound, jaundice, concern for obstruction EXAM: MRI ABDOMEN WITHOUT AND WITH CONTRAST (INCLUDING MRCP)  TECHNIQUE: Multiplanar multisequence MR imaging of the abdomen was performed both before and after the administration of intravenous contrast. Heavily T2-weighted images of the biliary and pancreatic ducts were obtained, and three-dimensional MRCP images were rendered by post processing. CONTRAST:  39mL GADAVIST GADOBUTROL 1 MMOL/ML IV SOLN COMPARISON:  None. FINDINGS: Lower chest: No acute findings. Hepatobiliary: Hepatomegaly, maximum coronal span 21.5 cm. Hepatic steatosis. No mass or other parenchymal abnormality identified. No biliary ductal dilatation. Minimal layering sludge in the gallbladder (series 9, image 35). No evidence of discrete calculi or other obstructing lesion to the ampulla. Pancreas: No mass, inflammatory changes, or other parenchymal abnormality identified. No pancreatic ductal dilatation. Spleen:  Within normal limits in size and appearance. Adrenals/Urinary Tract: No masses identified. Hemorrhagic or proteinaceous cyst of the inferior pole of the right kidney. No evidence of hydronephrosis. Stomach/Bowel: Visualized portions within the abdomen are unremarkable. Vascular/Lymphatic: No pathologically enlarged lymph nodes identified. No abdominal aortic aneurysm demonstrated. 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 Other:  None. Musculoskeletal: No suspicious bone lesions identified. IMPRESSION: 1. No acute findings to explain pain. Minimal layering sludge in the gallbladder without evidence of discrete gallstones or other obstructing lesions. No biliary ductal dilatation. 2. Hepatomegaly and hepatic steatosis. Electronically Signed   By: Lauralyn Primes M.D.   On: 07/21/2020 15:36   US BIOPSY (LIVER)  Result Date: 07/22/2020 INDICATION: 59 year old female with a history of liver failure EXAM: ULTRASOUND-GUIDED MEDICAL LIVER BIOPSY MEDICATIONS: None. ANESTHESIA/SEDATION: Moderate (conscious) sedation was employed during this procedure. A total of Versed 2.0 mg and  Fentanyl 100 mcg was administered intravenously. Moderate Sedation Time: 10 minutes. The patient's level of consciousness and vital signs were monitored continuously by radiology nursing throughout the procedure under my direct supervision. FLUOROSCOPY TIME:  Ultrasound COMPLICATIONS: None PROCEDURE: Informed written consent was obtained from the patient after a thorough discussion of the procedural risks, benefits and alternatives. All questions were addressed. Maximal Sterile Barrier Technique was utilized including caps, mask, sterile gowns, sterile gloves, sterile drape, hand hygiene and skin antiseptic. A timeout was performed prior to the initiation of the procedure. Ultrasound survey of the right liver lobe performed with images stored and sent to PACs. The right lower thorax/right upper abdomen was prepped with chlorhexidine in a sterile fashion, and a sterile drape was applied covering the operative field. A sterile gown and sterile gloves were used for the procedure. Local anesthesia was provided with 1% Lidocaine. The patient was prepped and draped sterilely and the skin and subcutaneous tissues were generously infiltrated with 1% lidocaine. A 17 gauge introducer needle was then advanced under ultrasound guidance in an intercostal location into the right liver lobe. The stylet was removed, and multiple separate 18 gauge core biopsy were retrieved. Samples were placed into fresh specimen/saline for transportation to the lab. Gel-Foam pledgets were then infused with a small amount of saline for assistance with hemostasis. The needle was removed, and a final ultrasound image was performed. The patient tolerated the procedure well and remained hemodynamically stable throughout. No complications were encountered and no significant blood loss was encounter. IMPRESSION: Status post ultrasound-guided medical liver biopsy. Signed, Yvone Neu. Reyne Dumas, RPVI [facility]  [facility] Electronically  Signed   By: Corrie Mckusick D.O.   On: 07/22/2020 16:54   MR ABDOMEN MRCP W WO CONTAST  Result Date: 07/21/2020 CLINICAL DATA:  Right upper quadrant pain, nondiagnostic ultrasound, jaundice, concern for obstruction EXAM: MRI ABDOMEN WITHOUT AND WITH CONTRAST (INCLUDING MRCP) TECHNIQUE: Multiplanar multisequence MR imaging of the abdomen was performed both before and after the administration of intravenous contrast. Heavily T2-weighted images of the biliary and pancreatic ducts were obtained, and three-dimensional MRCP images were rendered by post processing. CONTRAST:  35mL GADAVIST GADOBUTROL 1 MMOL/ML IV SOLN COMPARISON:  None. FINDINGS: Lower chest: No acute findings. Hepatobiliary: Hepatomegaly, maximum coronal span 21.5 cm. Hepatic steatosis. No mass or other parenchymal abnormality identified. No biliary ductal dilatation. Minimal layering sludge in the gallbladder (series 9, image 35). No evidence of discrete calculi or other obstructing lesion to the ampulla. Pancreas: No mass, inflammatory changes, or other parenchymal abnormality identified. No pancreatic ductal dilatation. Spleen:  Within normal limits in size and appearance. Adrenals/Urinary Tract: No masses identified. Hemorrhagic or proteinaceous cyst of the inferior pole of the right kidney. No evidence of hydronephrosis. Stomach/Bowel: Visualized portions within the abdomen are unremarkable. Vascular/Lymphatic: No pathologically enlarged lymph nodes identified. No abdominal aortic aneurysm demonstrated. 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 Other:  None. Musculoskeletal: No suspicious bone lesions identified. IMPRESSION: 1. No acute findings to explain pain. Minimal layering sludge in the gallbladder without evidence of discrete gallstones or other obstructing lesions. No biliary ductal dilatation. 2. Hepatomegaly and hepatic steatosis. Electronically Signed   By: Eddie Candle M.D.   On:  07/21/2020 15:36   US Abdomen Limited RUQ (LIVER/GB)  Result Date: 07/18/2020 CLINICAL DATA:  Abdominal pain increased LFTs EXAM: ULTRASOUND ABDOMEN LIMITED RIGHT UPPER QUADRANT COMPARISON:  CT prior day FINDINGS: Gallbladder: Layering slightly hyperdense sludge/small stones are seen. No sonographic Murphy sign noted by sonographer. Common bile duct: Diameter: 2 mm Liver: Increased echotexture seen throughout with hepatomegaly. No focal abnormality or biliary ductal dilatation. Portal vein is patent on color Doppler imaging with normal direction of blood flow towards the liver. Other: None. IMPRESSION: Layering gallbladder sludge/stones. No definite evidence of acute cholecystitis. Hepatic steatosis and mild hepatomegaly Electronically Signed   By: Prudencio Pair M.D.   On: 07/18/2020 02:45    DISCHARGE EXAMINATION: Vitals:   08/05/20 2059 08/06/20 0442 08/06/20 0500 08/06/20 1202  BP: (!) 142/80 (!) 160/91  (!) 142/64  Pulse: 69 73  77  Resp: 18 16  17   Temp: 98.6 F (37 C) 98.5 F (36.9 C)  98.4 F (36.9 C)  TempSrc: Oral Oral  Oral  SpO2: 95% 98%  97%  Weight:   79.9 kg   Height:       General appearance: Awake alert.  In no distress Resp: Clear to auscultation bilaterally.  Normal effort Cardio: S1-S2 is normal regular.  No S3-S4.  No rubs murmurs or bruit GI: Abdomen is soft.  Remains mildly tender in the right upper quadrant without any rebound rigidity or guarding.  Bowel sounds are present normal.  No masses organomegaly     DISPOSITION: Home  Discharge Instructions    Call MD for:  difficulty breathing, headache or visual disturbances   Complete by: As directed    Call MD for:  extreme fatigue   Complete by: As directed    Call MD for:  persistant dizziness or light-headedness   Complete by: As directed    Call MD for:  persistant nausea and vomiting   Complete  by: As directed    Call MD for:  severe uncontrolled pain   Complete by: As directed    Call MD for:   temperature >100.4   Complete by: As directed    Diet - low sodium heart healthy   Complete by: As directed    Discharge instructions   Complete by: As directed    Please take your medications as prescribed.  Follow-up with your gastroenterologist as scheduled.  Seek attention if your abdominal pain were to get worse or if you would develop nausea vomiting fever chills.  You were cared for by a hospitalist during your hospital stay. If you have any questions about your discharge medications or the care you received while you were in the hospital after you are discharged, you can call the unit and asked to speak with the hospitalist on call if the hospitalist that took care of you is not available. Once you are discharged, your primary care physician will handle any further medical issues. Please note that NO REFILLS for any discharge medications will be authorized once you are discharged, as it is imperative that you return to your primary care physician (or establish a relationship with a primary care physician if you do not have one) for your aftercare needs so that they can reassess your need for medications and monitor your lab values. If you do not have a primary care physician, you can call [phone] for a physician referral.   Increase activity slowly   Complete by: As directed    No wound care   Complete by: As directed        Allergies as of 08/06/2020      Reactions   Compazine [prochlorperazine Edisylate] Anaphylaxis   Periactin [cyproheptadine] Anaphylaxis   Cyclobenzaprine    Palpations    Haloperidol And Related    Metoclopramide Other (See Comments)   Other Other (See Comments)   Prochlorperazine Other (See Comments)      Medication List    STOP taking these medications   Nortrel 7/7/7 0.5/0.75/1-35 MG-MCG tablet Generic drug: norethindrone-ethinyl estradiol   rosuvastatin 5 MG tablet Commonly known as: CRESTOR     TAKE these medications   albuterol 108 (90 Base)  MCG/ACT inhaler Commonly known as: VENTOLIN HFA Inhale 2 puffs into the lungs 4 (four) times daily as needed for wheezing or shortness of breath.   amLODipine 5 MG tablet Commonly known as: NORVASC Take 1 tablet (5 mg total) by mouth daily.   eszopiclone 1 MG Tabs tablet Commonly known as: LUNESTA Take 1 mg by mouth at bedtime as needed for sleep.   famotidine 20 MG tablet Commonly known as: PEPCID Take 1 tablet (20 mg total) by mouth daily. Start taking on: December 30, [id number]   folic acid 1 MG tablet Commonly known as: FOLVITE Take 1 tablet (1 mg total) by mouth daily. Start taking on: August 07, 2020   Galcanezumab-gnlm 120 MG/ML Soaj Inject 120 mLs into the skin every 30 (thirty) days.   levothyroxine 75 MCG tablet Commonly known as: SYNTHROID Take 75 mcg by mouth daily before breakfast.   oxyCODONE 5 MG immediate release tablet Commonly known as: Oxy IR/ROXICODONE Take 1-2 tablets (5-10 mg total) by mouth every 6 (six) hours as needed for severe pain.   polyethylene glycol 17 g packet Commonly known as: MIRALAX / GLYCOLAX Take 17 g by mouth daily. Start taking on: August 07, 2020   prednisoLONE 5 MG Tabs tablet Take 8 tablets (40 mg  total) by mouth daily. Start taking on: August 07, 2020   thiamine 100 MG tablet Take 1 tablet (100 mg total) by mouth daily. Start taking on: August 07, 2020   ursodiol 300 MG capsule Commonly known as: ACTIGALL Take 2 capsules (600 mg total) by mouth 2 (two) times daily.   venlafaxine XR 75 MG 24 hr capsule Commonly known as: EFFEXOR-XR Take 75 mg by mouth daily.            Durable Medical Equipment  (From admission, onward)         Start     Ordered   07/23/20 1332  For home use only DME Walker rolling  Once       Question Answer Comment  Walker: With [place] Wheels   Patient needs a walker to treat with the following condition Fear for personal safety      07/23/20 1331            Follow-up  Information    [facility] Follow up.   Specialty: Home Health Services Why: Will supply HHPT at discharge here is [place]. Please call the above number if you have any questions or problems.  Contact information: [place] [phone]        [facility] Follow up.   Why: [facility] will supply [employer]. Call above number if you have any question.  Contact information: [facility]  [address] [place], [place] [address] [phone]       [facility], [facility] Follow up on 08/14/2020.   Why: at 10am. please call the office to confirm Contact information: [place] [address] [phone]               TOTAL DISCHARGE TIME: 35 minutes  [facility] Pager on [email or web address]  08/06/2020, 2:11 PM

## 2020-08-14 ENCOUNTER — Other Ambulatory Visit: Payer: Self-pay | Admitting: Physician Assistant

## 2020-08-14 ENCOUNTER — Ambulatory Visit
Admission: RE | Admit: 2020-08-14 | Discharge: 2020-08-14 | Disposition: A | Discharge status: Home / Self Care | Payer: Medicare PPO | Source: Ambulatory Visit | Attending: Physician Assistant | Admitting: Physician Assistant

## 2020-08-14 DIAGNOSIS — T1490XA Injury, unspecified, initial encounter: Secondary | ICD-10-CM

## 2020-08-14 IMAGING — CR DG WRIST 2V*R*
2 series · 2 of 2 positions shown · non-contrast
Comparison: [DATE]

CLINICAL DATA: Known distal radial fracture with recent fall.

EXAM:
RIGHT WRIST - 2 VIEW

[x wrist pa right]
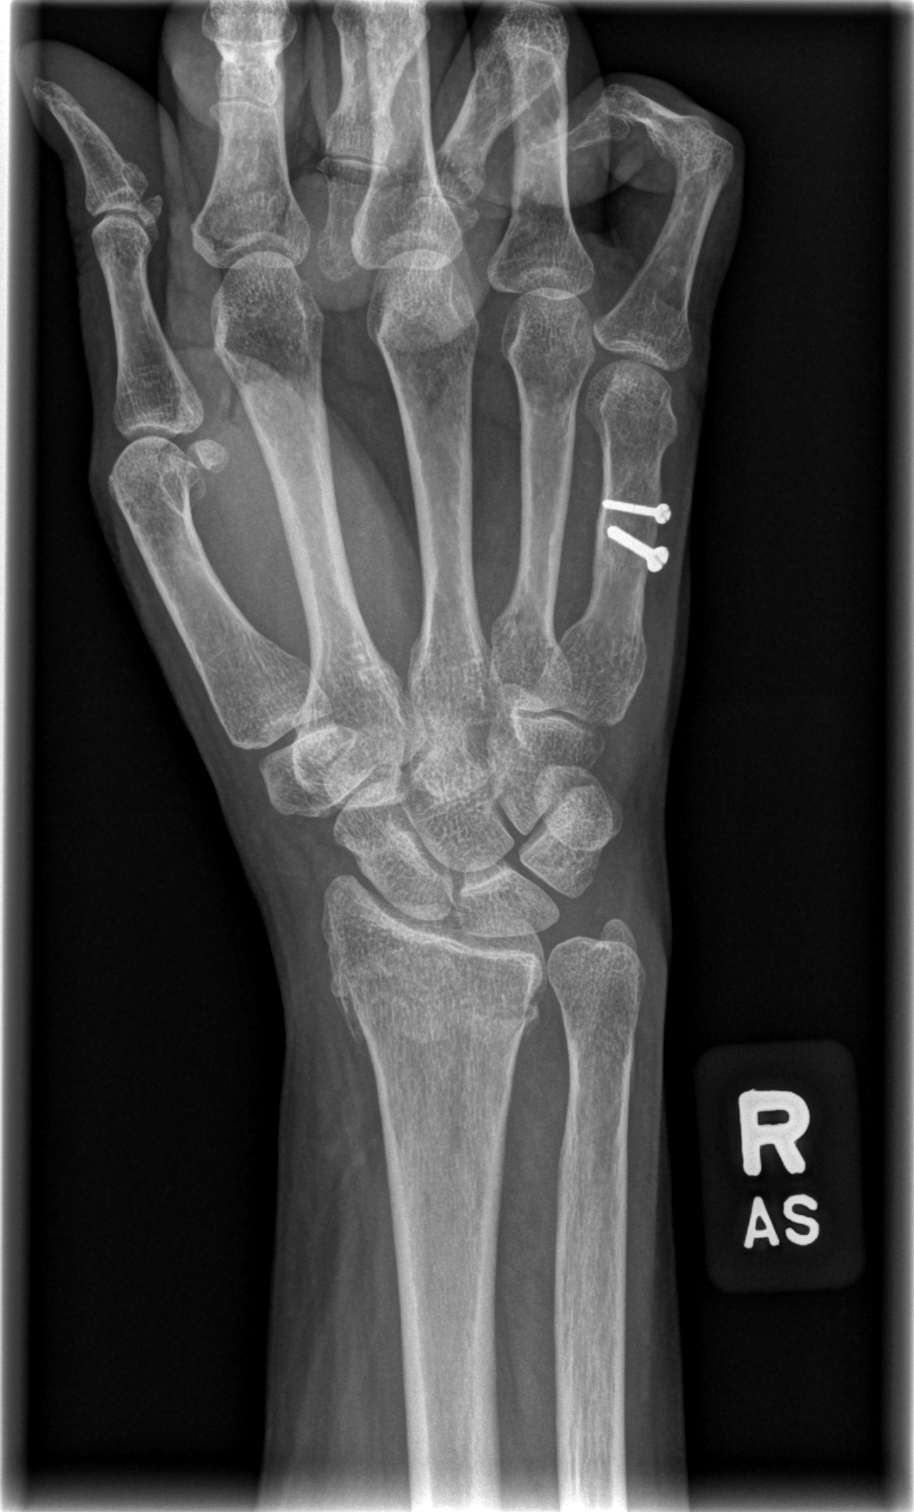

[x wrist lat right]
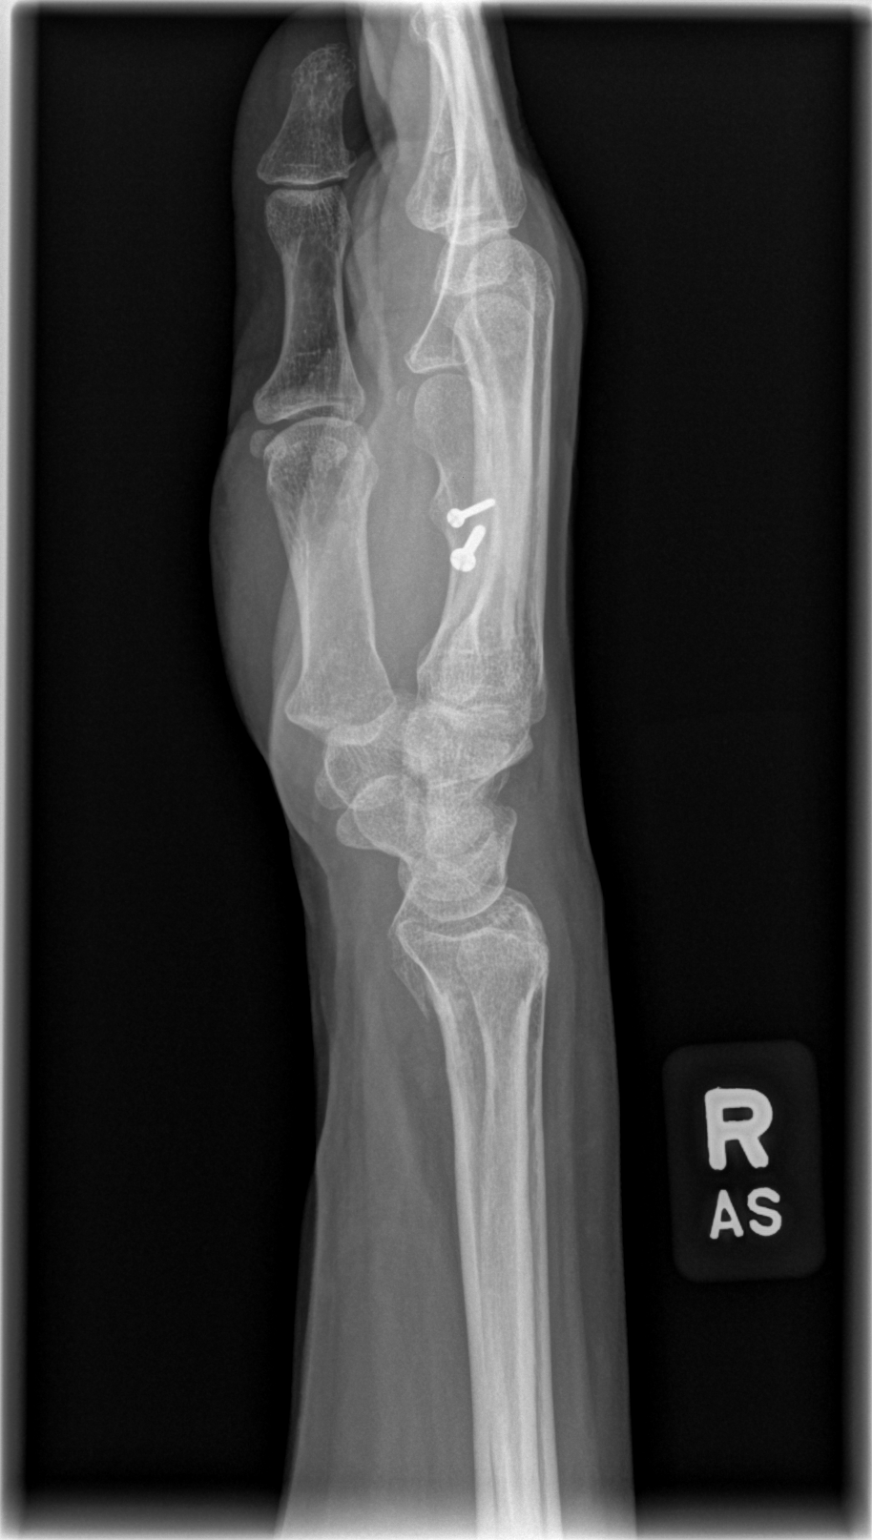

[2 of 2 positions shown; findings below may reference images not displayed]

FINDINGS: Redemonstrated known distal radial metaphyseal fracture with
interval increase in callus formation though without change in
alignment. Fracture line persists.

Residual soft tissue swelling about the wrist. No radiopaque foreign
body.

Stable sequela of cancellous screw fixation of the fifth metacarpal.
No evidence of hardware failure or loosening.

Joint spaces are preserved. No erosions. No evidence of
chondrocalcinosis.
IMPRESSION: No change in alignment of known distal radial metaphyseal fracture.

## 2020-08-26 ENCOUNTER — Other Ambulatory Visit (INDEPENDENT_AMBULATORY_CARE_PROVIDER_SITE_OTHER): Payer: Self-pay | Admitting: Obstetrics & Gynecology

## 2020-08-26 NOTE — Telephone Encounter (Addendum)
LOV  04/19/2019  NOV No new appointment scheduled.  Refill request for Nortrel.         ----- Message from [facility] sent at 08/26/2020 11:01 AM EST -----  [name] pt     Pt is stating that she is needing a refill on Norethin-Eth Estrad Triphasic (NORTREL 7/7/7, 28,) 0.5/0.75/1 mg- 35 mcg. Pt states she needs this sent to [facility].  Please advise.    Thank you,  [facility]     Preferred Pharmacy     [id number] [place], [place]    [address] [place] [address]    Phone: [phone] Fax: [phone]    Hours: Open 24 hours

## 2020-08-27 NOTE — Telephone Encounter (Signed)
We typically don't prescribe ocps for patients at her age.  Also, chart notes that she is a smoker so this is contraindicated. I would recommend that she discuss with Dr. Laurann Montana once her returns. Thanks!

## 2020-11-14 ENCOUNTER — Encounter (HOSPITAL_COMMUNITY): Payer: Self-pay

## 2020-11-14 ENCOUNTER — Inpatient Hospital Stay (HOSPITAL_COMMUNITY)
Admission: EM | Admit: 2020-11-14 | Discharge: 2020-11-20 | DRG: 442 | Disposition: A | Discharge status: Home Health | Payer: Medicare PPO | Attending: Student | Admitting: Student

## 2020-11-14 ENCOUNTER — Other Ambulatory Visit: Payer: Self-pay

## 2020-11-14 ENCOUNTER — Emergency Department (HOSPITAL_COMMUNITY): Payer: Medicare PPO

## 2020-11-14 DIAGNOSIS — K529 Noninfective gastroenteritis and colitis, unspecified: Secondary | ICD-10-CM | POA: Diagnosis present

## 2020-11-14 DIAGNOSIS — D6959 Other secondary thrombocytopenia: Secondary | ICD-10-CM | POA: Diagnosis present

## 2020-11-14 DIAGNOSIS — E872 Acidosis: Secondary | ICD-10-CM | POA: Diagnosis present

## 2020-11-14 DIAGNOSIS — K7010 Alcoholic hepatitis without ascites: Secondary | ICD-10-CM | POA: Diagnosis present

## 2020-11-14 DIAGNOSIS — K7200 Acute and subacute hepatic failure without coma: Principal | ICD-10-CM | POA: Diagnosis present

## 2020-11-14 DIAGNOSIS — R1011 Right upper quadrant pain: Secondary | ICD-10-CM | POA: Diagnosis not present

## 2020-11-14 DIAGNOSIS — F419 Anxiety disorder, unspecified: Secondary | ICD-10-CM | POA: Diagnosis present

## 2020-11-14 DIAGNOSIS — K831 Obstruction of bile duct: Secondary | ICD-10-CM | POA: Diagnosis not present

## 2020-11-14 DIAGNOSIS — Z8249 Family history of ischemic heart disease and other diseases of the circulatory system: Secondary | ICD-10-CM

## 2020-11-14 DIAGNOSIS — F32A Depression, unspecified: Secondary | ICD-10-CM | POA: Diagnosis present

## 2020-11-14 DIAGNOSIS — R9431 Abnormal electrocardiogram [ECG] [EKG]: Secondary | ICD-10-CM

## 2020-11-14 DIAGNOSIS — F1010 Alcohol abuse, uncomplicated: Secondary | ICD-10-CM | POA: Diagnosis present

## 2020-11-14 DIAGNOSIS — Z9119 Patient's noncompliance with other medical treatment and regimen: Secondary | ICD-10-CM | POA: Diagnosis not present

## 2020-11-14 DIAGNOSIS — E538 Deficiency of other specified B group vitamins: Secondary | ICD-10-CM | POA: Diagnosis present

## 2020-11-14 DIAGNOSIS — W010XXA Fall on same level from slipping, tripping and stumbling without subsequent striking against object, initial encounter: Secondary | ICD-10-CM | POA: Diagnosis not present

## 2020-11-14 DIAGNOSIS — E860 Dehydration: Secondary | ICD-10-CM

## 2020-11-14 DIAGNOSIS — E871 Hypo-osmolality and hyponatremia: Secondary | ICD-10-CM | POA: Diagnosis present

## 2020-11-14 DIAGNOSIS — Z20822 Contact with and (suspected) exposure to covid-19: Secondary | ICD-10-CM | POA: Diagnosis present

## 2020-11-14 DIAGNOSIS — R17 Unspecified jaundice: Secondary | ICD-10-CM

## 2020-11-14 DIAGNOSIS — L299 Pruritus, unspecified: Secondary | ICD-10-CM | POA: Diagnosis present

## 2020-11-14 DIAGNOSIS — R112 Nausea with vomiting, unspecified: Secondary | ICD-10-CM | POA: Diagnosis present

## 2020-11-14 DIAGNOSIS — D689 Coagulation defect, unspecified: Secondary | ICD-10-CM | POA: Diagnosis present

## 2020-11-14 DIAGNOSIS — Y92239 Unspecified place in hospital as the place of occurrence of the external cause: Secondary | ICD-10-CM | POA: Diagnosis not present

## 2020-11-14 DIAGNOSIS — D649 Anemia, unspecified: Secondary | ICD-10-CM | POA: Diagnosis present

## 2020-11-14 DIAGNOSIS — E876 Hypokalemia: Secondary | ICD-10-CM

## 2020-11-14 DIAGNOSIS — H1089 Other conjunctivitis: Secondary | ICD-10-CM | POA: Diagnosis present

## 2020-11-14 DIAGNOSIS — M545 Low back pain, unspecified: Secondary | ICD-10-CM | POA: Diagnosis not present

## 2020-11-14 DIAGNOSIS — Z83438 Family history of other disorder of lipoprotein metabolism and other lipidemia: Secondary | ICD-10-CM

## 2020-11-14 DIAGNOSIS — R160 Hepatomegaly, not elsewhere classified: Secondary | ICD-10-CM | POA: Diagnosis present

## 2020-11-14 DIAGNOSIS — R748 Abnormal levels of other serum enzymes: Secondary | ICD-10-CM | POA: Diagnosis not present

## 2020-11-14 DIAGNOSIS — K7581 Nonalcoholic steatohepatitis (NASH): Secondary | ICD-10-CM | POA: Diagnosis present

## 2020-11-14 DIAGNOSIS — I10 Essential (primary) hypertension: Secondary | ICD-10-CM | POA: Diagnosis present

## 2020-11-14 DIAGNOSIS — B9689 Other specified bacterial agents as the cause of diseases classified elsewhere: Secondary | ICD-10-CM

## 2020-11-14 DIAGNOSIS — H109 Unspecified conjunctivitis: Secondary | ICD-10-CM | POA: Diagnosis not present

## 2020-11-14 DIAGNOSIS — Z87891 Personal history of nicotine dependence: Secondary | ICD-10-CM

## 2020-11-14 DIAGNOSIS — K821 Hydrops of gallbladder: Secondary | ICD-10-CM | POA: Diagnosis present

## 2020-11-14 DIAGNOSIS — E039 Hypothyroidism, unspecified: Secondary | ICD-10-CM | POA: Diagnosis present

## 2020-11-14 DIAGNOSIS — E785 Hyperlipidemia, unspecified: Secondary | ICD-10-CM | POA: Diagnosis present

## 2020-11-14 DIAGNOSIS — M6282 Rhabdomyolysis: Secondary | ICD-10-CM | POA: Diagnosis present

## 2020-11-14 DIAGNOSIS — Z888 Allergy status to other drugs, medicaments and biological substances status: Secondary | ICD-10-CM

## 2020-11-14 DIAGNOSIS — D696 Thrombocytopenia, unspecified: Secondary | ICD-10-CM | POA: Diagnosis not present

## 2020-11-14 DIAGNOSIS — Z79899 Other long term (current) drug therapy: Secondary | ICD-10-CM

## 2020-11-14 DIAGNOSIS — Z79890 Hormone replacement therapy (postmenopausal): Secondary | ICD-10-CM

## 2020-11-14 DIAGNOSIS — W19XXXA Unspecified fall, initial encounter: Secondary | ICD-10-CM

## 2020-11-14 HISTORY — DX: Leiomyoma of uterus, unspecified: D25.9

## 2020-11-14 HISTORY — DX: Liver disease, unspecified: K76.9

## 2020-11-14 LAB — RAPID URINE DRUG SCREEN, HOSP PERFORMED
Amphetamines: NOT DETECTED
Barbiturates: NOT DETECTED
Benzodiazepines: NOT DETECTED
Cocaine: NOT DETECTED
Opiates: NOT DETECTED
Tetrahydrocannabinol: NOT DETECTED

## 2020-11-14 LAB — URINALYSIS, ROUTINE W REFLEX MICROSCOPIC
Bacteria, UA: NONE SEEN
Bilirubin Urine: NEGATIVE
Glucose, UA: NEGATIVE mg/dL
Ketones, ur: NEGATIVE mg/dL
Leukocytes,Ua: NEGATIVE
Nitrite: NEGATIVE
Protein, ur: NEGATIVE mg/dL
Specific Gravity, Urine: 1.027 (ref 1.005–1.030)
pH: 8 (ref 5.0–8.0)

## 2020-11-14 LAB — COMPREHENSIVE METABOLIC PANEL
ALT: 22 U/L (ref 0–44)
AST: 147 U/L — ABNORMAL HIGH (ref 15–41)
Albumin: 3.5 g/dL (ref 3.5–5.0)
Alkaline Phosphatase: 108 U/L (ref 38–126)
Anion gap: 21 — ABNORMAL HIGH (ref 5–15)
BUN: 6 mg/dL (ref 6–20)
CO2: 18 mmol/L — ABNORMAL LOW (ref 22–32)
Calcium: 8.1 mg/dL — ABNORMAL LOW (ref 8.9–10.3)
Chloride: 93 mmol/L — ABNORMAL LOW (ref 98–111)
Creatinine, Ser: 0.88 mg/dL (ref 0.44–1.00)
GFR, Estimated: >60 mL/min (ref >60)
Glucose, Bld: 95 mg/dL (ref 70–99)
Potassium: 2.2 mmol/L — CL (ref 3.5–5.1)
Sodium: 132 mmol/L — ABNORMAL LOW (ref 135–145)
Total Bilirubin: 9.4 mg/dL — ABNORMAL HIGH (ref 0.3–1.2)
Total Protein: 7.4 g/dL (ref 6.5–8.1)

## 2020-11-14 LAB — CBC
HCT: 41 % (ref 36.0–46.0)
Hemoglobin: 13.4 g/dL (ref 12.0–15.0)
MCH: 31 pg (ref 26.0–34.0)
MCHC: 32.7 g/dL (ref 30.0–36.0)
MCV: 94.9 fL (ref 80.0–100.0)
Platelets: 171 10*3/uL (ref 150–400)
RBC: 4.32 MIL/uL (ref 3.87–5.11)
RDW: 20.4 % — ABNORMAL HIGH (ref 11.5–15.5)
WBC: 7.9 10*3/uL (ref 4.0–10.5)
nRBC: 0.3 % — ABNORMAL HIGH (ref 0.0–0.2)

## 2020-11-14 LAB — RESP PANEL BY RT-PCR (FLU A&B, COVID) ARPGX2
Influenza A by PCR: NEGATIVE
Influenza B by PCR: NEGATIVE
SARS Coronavirus 2 by RT PCR: NEGATIVE

## 2020-11-14 LAB — AMMONIA: Ammonia: 125 umol/L — ABNORMAL HIGH (ref 9–35)

## 2020-11-14 LAB — MAGNESIUM: Magnesium: 1.3 mg/dL — ABNORMAL LOW (ref 1.7–2.4)

## 2020-11-14 LAB — PROTIME-INR
INR: 1.2 (ref 0.8–1.2)
Prothrombin Time: 14.9 seconds (ref 11.4–15.2)

## 2020-11-14 LAB — CK: Total CK: 537 U/L — ABNORMAL HIGH (ref 38–234)

## 2020-11-14 LAB — CBG MONITORING, ED: Glucose-Capillary: 86 mg/dL (ref 70–99)

## 2020-11-14 LAB — LIPASE, BLOOD: Lipase: 99 U/L — ABNORMAL HIGH (ref 11–51)

## 2020-11-14 IMAGING — CT CT ABD-PELV W/ CM
2 of 5 series · 15 of 46 positions shown, 17 images · IV contrast (omnipaque)
Comparison: None.

CLINICAL DATA: Nausea vomiting epigastric

EXAM:
CT ABDOMEN AND PELVIS WITH CONTRAST
TECHNIQUE: Multidetector CT imaging of the abdomen and pelvis was performed
using the standard protocol following bolus administration of
intravenous contrast.
CONTRAST:  100mL OMNIPAQUE IOHEXOL 300 MG/ML  SOLN

[Series 2: axial st · axial · 0.72mm/px · z∈[+1022,+1456]mm · 12 of 101 slices shown, 14 images]
[im 7/101  soft-tissue]
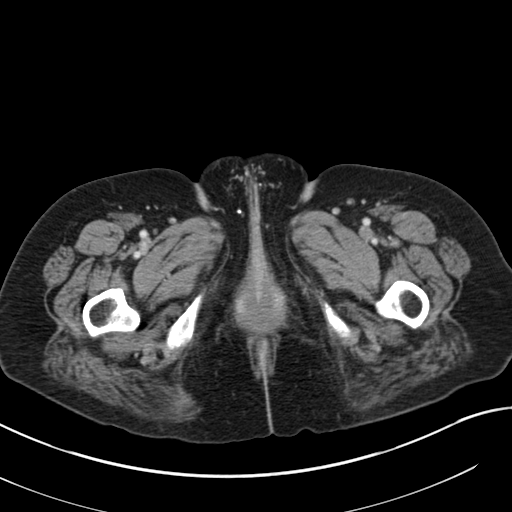
[im 7/101  bone]
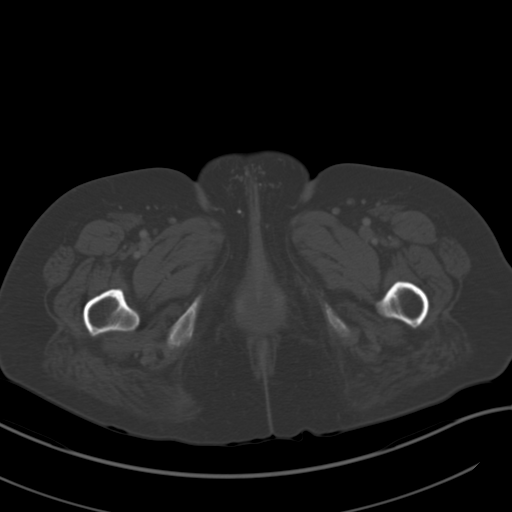
[im 14/101  soft-tissue]
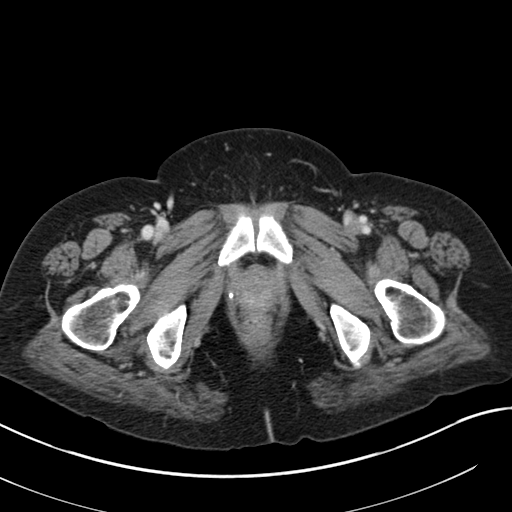
[im 21/101  soft-tissue]
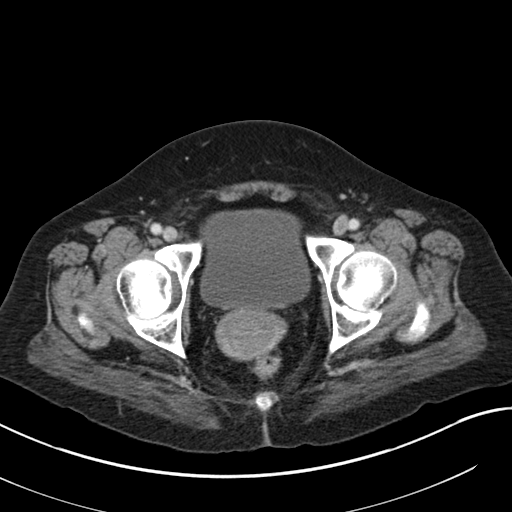
[im 34/101  soft-tissue]
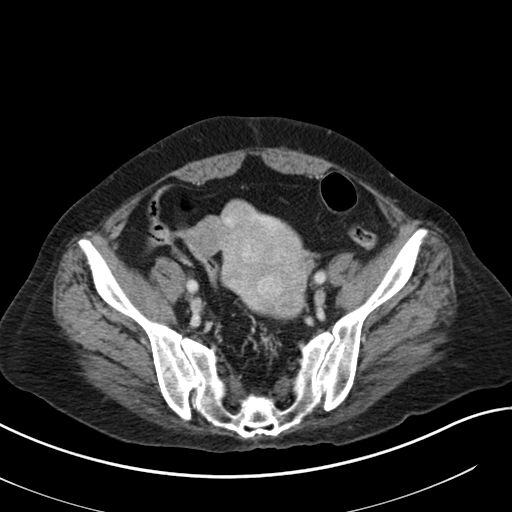
[im 41/101  soft-tissue]
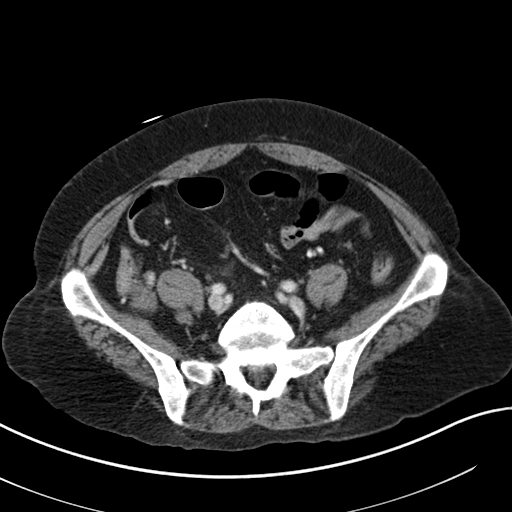
[im 47/101  soft-tissue]
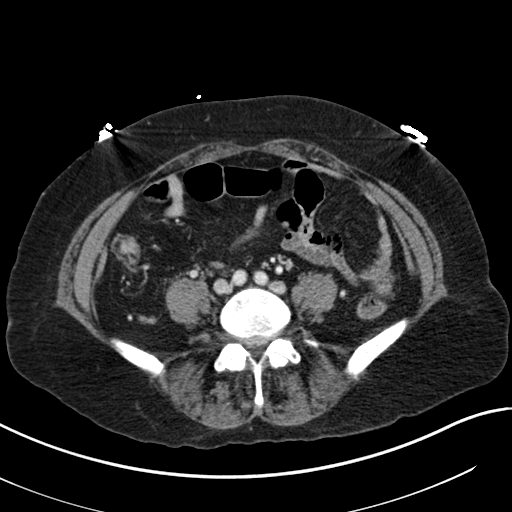
[im 54/101  soft-tissue]
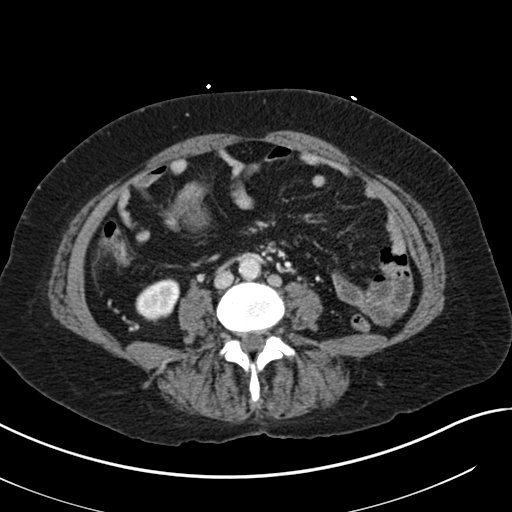
[im 61/101  soft-tissue]
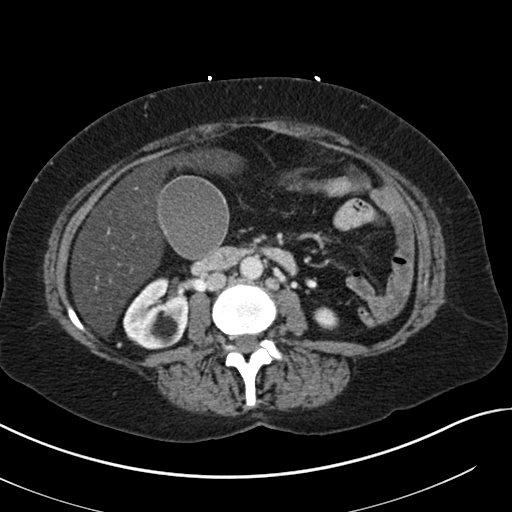
[im 67/101  soft-tissue]
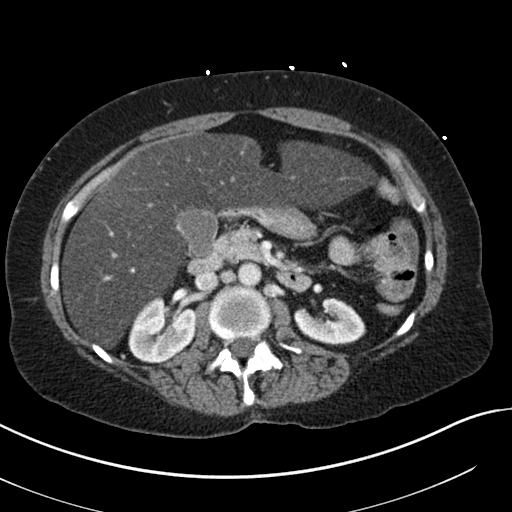
[im 67/101  bone]
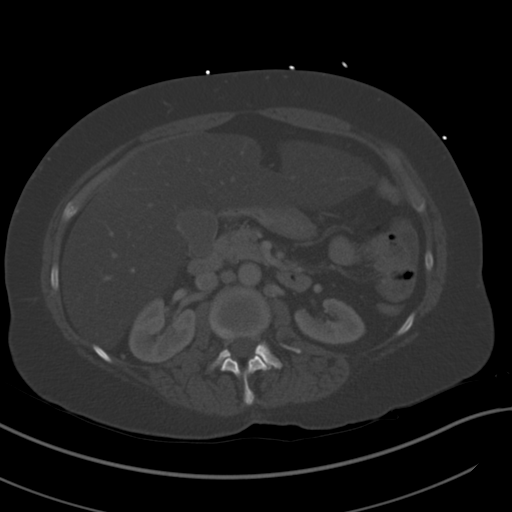
[im 81/101  soft-tissue]
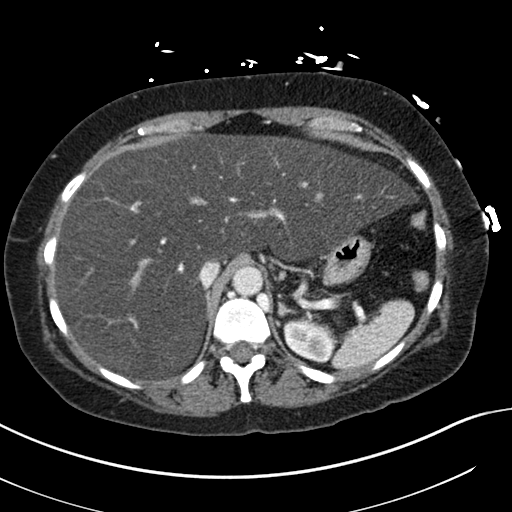
[im 87/101  soft-tissue]
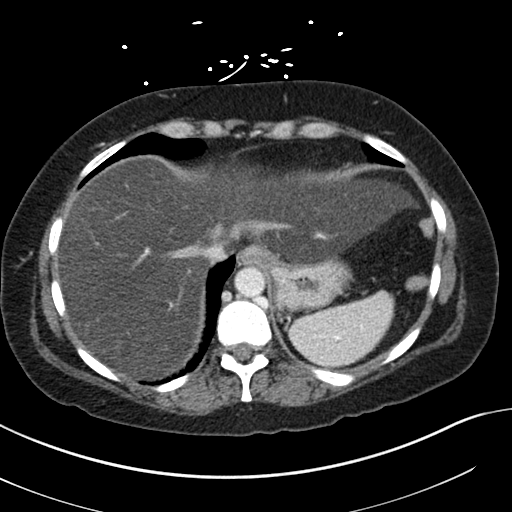
[im 94/101  soft-tissue]
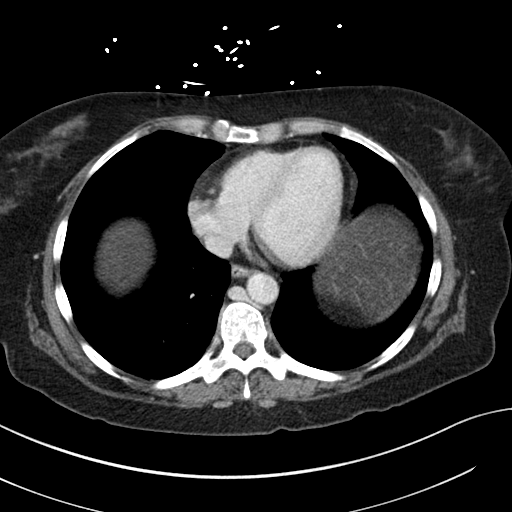

[Series 5: coronal st · coronal · 0.76mm/px · 3 of 143 slices shown]
[im 48/143  soft-tissue]
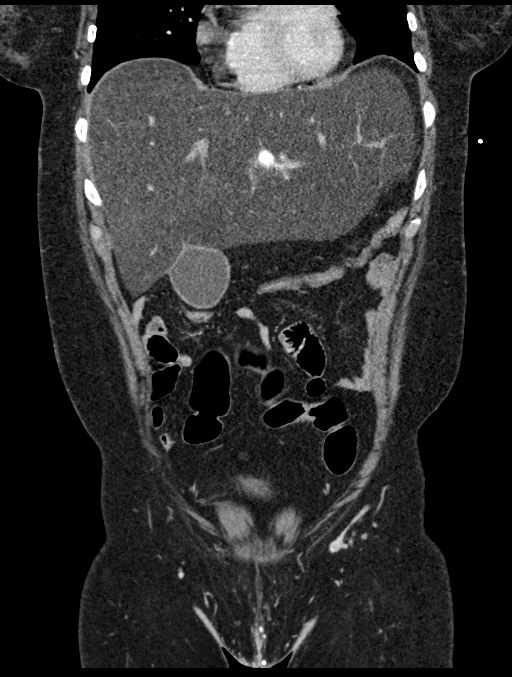
[im 64/143  soft-tissue]
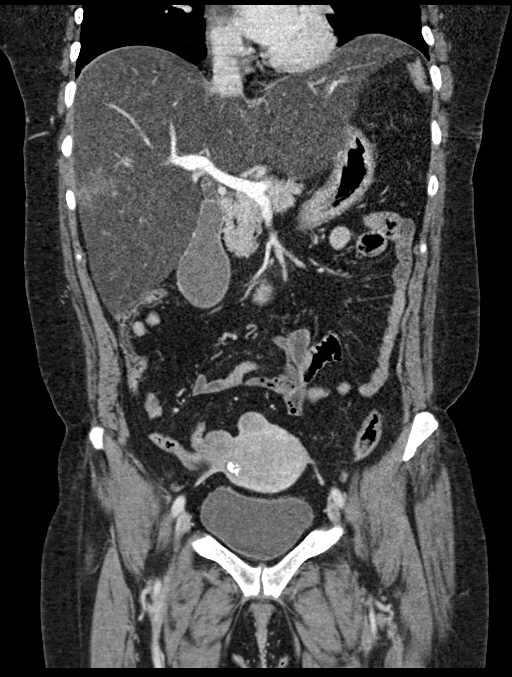
[im 79/143  soft-tissue]
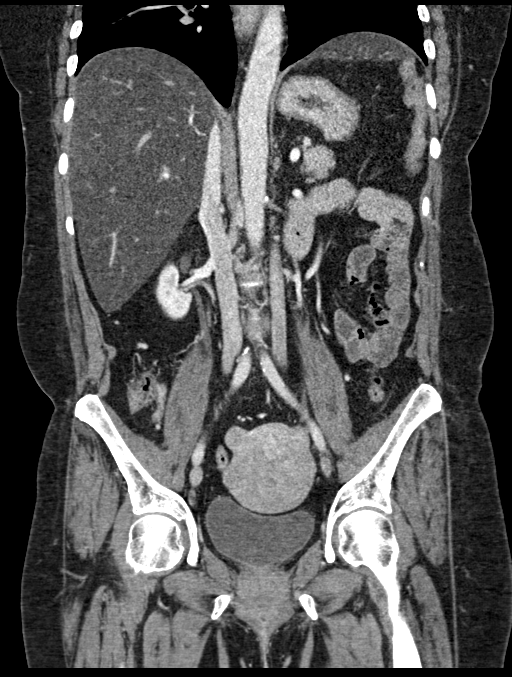

[15 of 46 positions shown; findings below may reference images not displayed]

FINDINGS: Lower chest: The visualized heart size within normal limits. No
pericardial fluid/thickening.

No hiatal hernia.

The visualized portions of the lungs are clear.

Hepatobiliary: There is diffuse low density seen throughout the
liver parenchyma with mild hepatomegaly.The main portal vein is
patent. No evidence of calcified gallstones, gallbladder wall
thickening or biliary dilatation. Fluid-filled dilated gallbladder
is again noted.

Pancreas: Unremarkable. No pancreatic ductal dilatation or
surrounding inflammatory changes.

Spleen: Normal in size without focal abnormality.

Adrenals/Urinary Tract: Both adrenal glands appear normal. There is
a 3 cm low-density lesion seen in the upper pole of the right
kidney. A punctate 4 mm calculus seen in lower pole of the right
kidney. There is a punctate calcifications seen in the upper pole of
the left kidney. The bladder is unremarkable. No hydronephrosis.

Stomach/Bowel: There is apparent mild wall thickening seen within
the mid body of the stomach. However no surrounding inflammatory
changes are seen. The small bowel, and colon are normal in
appearance. No inflammatory changes, wall thickening, or obstructive
findings.

Vascular/Lymphatic: There are no enlarged mesenteric,
retroperitoneal, or pelvic lymph nodes. No significant vascular
findings are present.

Reproductive: Again noted is a fibroid uterus some with partial
internal calcifications noted.

Other: No evidence of abdominal wall mass or hernia.

Musculoskeletal: No acute or significant osseous findings.
IMPRESSION: Hepatic steatosis and mild hepatomegaly

Unchanged gallbladder hydrops

Apparent wall thickening of the mid body of stomach which may be due
to physiologic under distension versus mild gastritis

Unchanged fibroid uterus

## 2020-11-14 MED ORDER — ERYTHROMYCIN 5 MG/GM OP OINT
TOPICAL_OINTMENT | Freq: Four times a day (QID) | OPHTHALMIC | Status: DC
  Administered 2020-11-15 – 2020-11-18 (×7): 1 via OPHTHALMIC
  Filled 2020-11-14: qty 4

## 2020-11-14 MED ORDER — SODIUM CHLORIDE 0.9 % IV BOLUS
1000.0000 mL | Freq: Once | INTRAVENOUS | Status: AC
  Administered 2020-11-14: 1000 mL via INTRAVENOUS

## 2020-11-14 MED ORDER — LACTULOSE 10 GM/15ML PO SOLN
30.0000 g | Freq: Once | ORAL | Status: AC
  Administered 2020-11-14: 30 g via ORAL
  Filled 2020-11-14: qty 60

## 2020-11-14 MED ORDER — LORAZEPAM 2 MG/ML IJ SOLN
0.5000 mg | Freq: Four times a day (QID) | INTRAMUSCULAR | Status: DC | PRN
  Administered 2020-11-14: 0.5 mg via INTRAVENOUS
  Filled 2020-11-14: qty 1

## 2020-11-14 MED ORDER — SODIUM CHLORIDE 0.9 % IV SOLN
INTRAVENOUS | Status: DC | PRN
  Administered 2020-11-14: 250 mL via INTRAVENOUS

## 2020-11-14 MED ORDER — IOHEXOL 300 MG/ML  SOLN
100.0000 mL | Freq: Once | INTRAMUSCULAR | Status: AC | PRN
  Administered 2020-11-14: 100 mL via INTRAVENOUS

## 2020-11-14 MED ORDER — BOOST / RESOURCE BREEZE PO LIQD CUSTOM
1.0000 | Freq: Three times a day (TID) | ORAL | Status: DC
  Administered 2020-11-15 – 2020-11-20 (×11): 1 via ORAL

## 2020-11-14 MED ORDER — SODIUM CHLORIDE 0.9 % IV SOLN: INTRAVENOUS | Status: DC

## 2020-11-14 MED ORDER — MAGNESIUM SULFATE 2 GM/50ML IV SOLN
2.0000 g | Freq: Once | INTRAVENOUS | Status: AC
  Administered 2020-11-14: 2 g via INTRAVENOUS
  Filled 2020-11-14: qty 50

## 2020-11-14 MED ORDER — POTASSIUM CHLORIDE 10 MEQ/100ML IV SOLN
10.0000 meq | INTRAVENOUS | Status: AC
  Administered 2020-11-14 – 2020-11-15 (×6): 10 meq via INTRAVENOUS
  Filled 2020-11-14 (×6): qty 100

## 2020-11-14 NOTE — Plan of Care (Signed)
Plan of care discussed. Less nausea since Ativan.  Bowel sound faint and hypoactive, tender RUQ.

## 2020-11-14 NOTE — ED Notes (Signed)
Patient transported to CT 

## 2020-11-14 NOTE — ED Notes (Signed)
Pt currently has third bag of K+ running. Administration times got skewed. Refer to order of six bags total

## 2020-11-14 NOTE — ED Notes (Signed)
No telemetry orders noted.  Telemetry not available on 3W. H&P note not visible yet. Accepting Patient if not needing cardiac telemetry monitoring. End

## 2020-11-14 NOTE — ED Triage Notes (Signed)
Emergency Medicine Provider Triage Evaluation Note  Judy Meyer , a 60 y.o. female  was evaluated in triage.  Pt complains of abdominal pain, nausea, vomiting for the past 6 weeks that has significantly worsened within the past week.  Reports generalized fatigue and weakness inability to complete her task at home secondary to the weakness.  Reports right upper quadrant pain that feels "a little lower than my liver."  Review of Systems  Positive: Abdominal pain, nausea and vomiting Negative: Chest pain  Physical Exam  BP (!) 160/98 (BP Location: Left Arm)   Pulse 88   Temp 98.1 F (36.7 C) (Oral)   Resp 16   Ht 5\' 6"  (1.676 m)   Wt 68 kg   LMP 06/11/2018   SpO2 96%   BMI 24.21 kg/m  Gen:   Awake, no distress, face appears jaundiced HEENT:  Atraumatic  Resp:  Normal effort  Cardiac:  Normal rate  Abd:   Right upper quadrant tenderness MSK:   Moves extremities without difficulty  Neuro:  Speech clear   Medical Decision Making  Medically screening exam initiated at 2:53 PM.  Appropriate orders placed.  Judy Meyer was informed that the remainder of the evaluation will be completed by another provider, this initial triage assessment does not replace that evaluation, and the importance of remaining in the ED until their evaluation is complete.  Clinical Impression  60 year old female with liver disease presenting to the ED for right upper quadrant pain, nausea and vomiting as well as generalized fatigue.  Will obtain lab work including INR and ammonia.   Delia Heady, PA-C 11/20/20 1506

## 2020-11-14 NOTE — ED Provider Notes (Signed)
[facility] DEPT Provider Note   CSN: [id number] Arrival date & time: 11/14/20  1431     History Chief Complaint  Patient presents with  . Emesis  . Abdominal Pain    Judy Meyer is a 61 y.o. female with a past medical history of liver disease without clear cause, hypertension, dehydration, who presents today for evaluation of worsening weakness.  She states that she has been unable to shower or bathe herself for the past 2 weeks is when she tries to get up she is physically too weak.  She has been nauseous and vomiting significantly worsening over the past 2 weeks.  1 week ago she developed right upper quadrant abdominal pain.  She denies any fevers, does report chills.  She was last seen January 27 with transplant hepatology.  She had been on steroids prior to this, went from 20 mg to 10 mg however then was unable to further taper.  She stopped a month ago due to not being able to keep any of them down.  Her partner who is present in the room notes that in the past 24 hours he feels like she has become confused and has noted scleral icterus and general jaundice.  She states that her last normal bowel movement was 6 weeks ago, since then she will have small-volume diarrhea.  HPI     Past Medical History:  Diagnosis Date  . Hypertension   . Hypothyroidism   . Liver disease   . Uterine fibroid     Patient Active Problem List   Diagnosis Date Noted  . Prolonged Q-T interval on ECG 11/14/2020  . Intractable vomiting with nausea 11/14/2020  . Jaundice   . Transaminitis   . Hypomagnesemia   . Swelling of lower extremity   . Folate deficiency   . Rhabdomyolysis 07/20/2020  . Malnutrition of moderate degree 07/19/2020  . Acute lower UTI 07/18/2020  . Dehydration 07/18/2020  . Hyperbilirubinemia 07/18/2020  . Abnormal liver function 07/18/2020  . Macrocytic anemia 07/18/2020  . Iron excess 07/18/2020  . Gastroenteritis 04/07/2020  .  Nephrolithiasis 04/07/2020  . HTN (hypertension) 04/07/2020  . Intractable nausea and vomiting 04/06/2020  . Depression 04/06/2020  . Hypokalemia 04/06/2020  . Hypothyroidism 04/06/2020  . Hyperlipidemia 04/06/2020    Past Surgical History:  Procedure Laterality Date  . hand fracture surgery Right   . LIVER BIOPSY       OB History   No obstetric history on file.     Family History  Problem Relation Age of Onset  . Hypertension Other   . Hypertension Mother   . High Cholesterol Mother     Social History   Tobacco Use  . Smoking status: Never Smoker  . Smokeless tobacco: Never Used  Vaping Use  . Vaping Use: Never used  Substance Use Topics  . Alcohol use: Not Currently  . Drug use: Never    Home Medications Prior to Admission medications   Medication Sig Start Date End Date Taking? Authorizing Provider  albuterol (VENTOLIN HFA) 108 (90 Base) MCG/ACT inhaler Inhale 2 puffs into the lungs 4 (four) times daily as needed for wheezing or shortness of breath. 12/20/19  Yes [provider]  amLODipine (NORVASC) 5 MG tablet Take 5 mg by mouth daily.   Yes [provider]  Galcanezumab-gnlm 120 MG/ML SOAJ Inject 120 mLs into the skin every 30 (thirty) days.   Yes [provider]  levothyroxine (SYNTHROID) 25 MCG tablet Take 25  mcg by mouth every morning. 08/19/20  Yes [provider]  pantoprazole (PROTONIX) 40 MG tablet Take 1 tablet by mouth daily. 11/07/20  Yes [provider]  venlafaxine XR (EFFEXOR-XR) 75 MG 24 hr capsule Take 75 mg by mouth daily.   Yes [provider]  amLODipine (NORVASC) 5 MG tablet Take 1 tablet (5 mg total) by mouth daily. 08/06/20 09/05/20  Bonnielee Haff, MD  famotidine (PEPCID) 20 MG tablet Take 1 tablet (20 mg total) by mouth daily. Patient not taking: Reported on 11/14/2020 08/07/20   Bonnielee Haff, MD  folic acid (FOLVITE) 1 MG tablet Take 1 tablet (1 mg total) by mouth daily. Patient not  taking: Reported on 11/14/2020 08/07/20   Bonnielee Haff, MD  oxyCODONE (OXY IR/ROXICODONE) 5 MG immediate release tablet Take 1-2 tablets (5-10 mg total) by mouth every 6 (six) hours as needed for severe pain. Patient not taking: Reported on 11/14/2020 08/06/20   Bonnielee Haff, MD  polyethylene glycol (MIRALAX / GLYCOLAX) 17 g packet Take 17 g by mouth daily. Patient not taking: Reported on 11/14/2020 08/07/20   Bonnielee Haff, MD  prednisoLONE 5 MG TABS tablet Take 8 tablets (40 mg total) by mouth daily. Patient not taking: Reported on 11/14/2020 08/07/20   Bonnielee Haff, MD  thiamine 100 MG tablet Take 1 tablet (100 mg total) by mouth daily. Patient not taking: No sig reported 08/07/20   Bonnielee Haff, MD  ursodiol (ACTIGALL) 300 MG capsule Take 2 capsules (600 mg total) by mouth 2 (two) times daily. Patient not taking: Reported on 11/14/2020 08/06/20   Bonnielee Haff, MD    Allergies    Compazine [prochlorperazine edisylate], Periactin [cyproheptadine], Cyclobenzaprine, Haloperidol and related, Metoclopramide, Other, and Prochlorperazine  Review of Systems   Review of Systems  Constitutional: Positive for chills and fatigue. Negative for fever.  Respiratory: Negative for chest tightness and shortness of breath.   Cardiovascular: Negative for chest pain.  Gastrointestinal: Positive for abdominal pain, nausea and vomiting.  Musculoskeletal: Positive for back pain and myalgias.  Skin: Positive for color change.  Neurological: Positive for tremors and weakness. Negative for headaches.  Psychiatric/Behavioral: Positive for confusion.  All other systems reviewed and are negative.   Physical Exam Updated Vital Signs BP (!) 159/80 (BP Location: Left Arm)   Pulse 76   Temp 97.6 F (36.4 C)   Resp 18   Ht 5\' 6"  (1.676 m)   Wt 73.2 kg   LMP 06/11/2018   SpO2 100%   BMI 26.05 kg/m   Physical Exam Vitals and nursing note reviewed.  Constitutional:      General: She is not in acute  distress.    Appearance: She is not diaphoretic.     Comments: Appears chronically ill  HENT:     Head: Normocephalic and atraumatic.  Eyes:     General: Scleral icterus present.        Right eye: No discharge.        Left eye: No discharge.     Conjunctiva/sclera: Conjunctivae normal.     Comments: Significant bilateral periorbital dried discharge/crusting.    Cardiovascular:     Rate and Rhythm: Normal rate and regular rhythm.  Pulmonary:     Effort: Pulmonary effort is normal. No respiratory distress.     Breath sounds: No stridor.  Abdominal:     General: There is no distension.     Palpations: Abdomen is soft.     Tenderness: There is abdominal tenderness in the right upper  quadrant, right lower quadrant, epigastric area and suprapubic area.     Hernia: No hernia is present.  Musculoskeletal:        General: No deformity.     Cervical back: Normal range of motion.  Skin:    General: Skin is warm and dry.     Coloration: Skin is jaundiced.  Neurological:     Mental Status: She is alert.     Motor: No abnormal muscle tone.  Psychiatric:        Behavior: Behavior normal.     ED Results / Procedures / Treatments   Labs (all labs ordered are listed, but only abnormal results are displayed) Labs Reviewed  LIPASE, BLOOD - Abnormal; Notable for the following components:      Result Value   Lipase 99 (*)    All other components within normal limits  COMPREHENSIVE METABOLIC PANEL - Abnormal; Notable for the following components:   Sodium 132 (*)    Potassium 2.2 (*)    Chloride 93 (*)    CO2 18 (*)    Calcium 8.1 (*)    AST 147 (*)    Total Bilirubin 9.4 (*)    Anion gap 21 (*)    All other components within normal limits  CBC - Abnormal; Notable for the following components:   RDW 20.4 (*)    nRBC 0.3 (*)    All other components within normal limits  URINALYSIS, ROUTINE W REFLEX MICROSCOPIC - Abnormal; Notable for the following components:   Color, Urine AMBER  (*)    Hgb urine dipstick SMALL (*)    All other components within normal limits  CK - Abnormal; Notable for the following components:   Total CK 537 (*)    All other components within normal limits  AMMONIA - Abnormal; Notable for the following components:   Ammonia 125 (*)    All other components within normal limits  MAGNESIUM - Abnormal; Notable for the following components:   Magnesium 1.3 (*)    All other components within normal limits  RESP PANEL BY RT-PCR (FLU A&B, COVID) [id number]  PROTIME-INR  RAPID URINE DRUG SCREEN, HOSP PERFORMED  COMPREHENSIVE METABOLIC PANEL  CBC  CBG MONITORING, ED    EKG EKG Interpretation  Date/Time:  Friday November 14 2020 16:20:59 EDT Ventricular Rate:  79 PR Interval:  127 QRS Duration: 91 QT Interval:  472 QTC Calculation: 542 R Axis:   61 Text Interpretation: Sinus rhythm Prolonged QT interval prolonged QT new since previous Confirmed by Wandra Arthurs [phone]) on 11/14/2020 4:42:22 PM   Radiology CT Abdomen Pelvis W Contrast  Result Date: 11/14/2020 CLINICAL DATA:  Nausea vomiting epigastric EXAM: CT ABDOMEN AND PELVIS WITH CONTRAST TECHNIQUE: Multidetector CT imaging of the abdomen and pelvis was performed using the standard protocol following bolus administration of intravenous contrast. CONTRAST:  141mL OMNIPAQUE IOHEXOL 300 MG/ML  SOLN COMPARISON:  None. FINDINGS: Lower chest: The visualized heart size within normal limits. No pericardial fluid/thickening. No hiatal hernia. The visualized portions of the lungs are clear. Hepatobiliary: There is diffuse low density seen throughout the liver parenchyma with mild hepatomegaly.The main portal vein is patent. No evidence of calcified gallstones, gallbladder wall thickening or biliary dilatation. Fluid-filled dilated gallbladder is again noted. Pancreas: Unremarkable. No pancreatic ductal dilatation or surrounding inflammatory changes. Spleen: Normal in size without focal abnormality. Adrenals/Urinary  Tract: Both adrenal glands appear normal. There is a 3 cm low-density lesion seen in the upper pole of the right kidney.  A punctate 4 mm calculus seen in lower pole of the right kidney. There is a punctate calcifications seen in the upper pole of the left kidney. The bladder is unremarkable. No hydronephrosis. Stomach/Bowel: There is apparent mild wall thickening seen within the mid body of the stomach. However no surrounding inflammatory changes are seen. The small bowel, and colon are normal in appearance. No inflammatory changes, wall thickening, or obstructive findings. Vascular/Lymphatic: There are no enlarged mesenteric, retroperitoneal, or pelvic lymph nodes. No significant vascular findings are present. Reproductive: Again noted is a fibroid uterus some with partial internal calcifications noted. Other: No evidence of abdominal wall mass or hernia. Musculoskeletal: No acute or significant osseous findings. IMPRESSION: Hepatic steatosis and mild hepatomegaly Unchanged gallbladder hydrops Apparent wall thickening of the mid body of stomach which may be due to physiologic under distension versus mild gastritis Unchanged fibroid uterus Electronically Signed   By: Prudencio Pair M.D.   On: 11/14/2020 18:23    Procedures .Critical Care Performed by: Lorin Glass, PA-C Authorized by: Lorin Glass, PA-C   Critical care provider statement:    Critical care time (minutes):  45   Critical care was necessary to treat or prevent imminent or life-threatening deterioration of the following conditions:  Metabolic crisis and hepatic failure   Critical care was time spent personally by me on the following activities:  Discussions with consultants, evaluation of patient's response to treatment, examination of patient, ordering and performing treatments and interventions, ordering and review of laboratory studies, ordering and review of radiographic studies, pulse oximetry, re-evaluation of patient's  condition, obtaining history from patient or surrogate and review of old charts Comments:     Multiple electrolyte abnormalities requiring IV treatment contributing to EKG abnormalities. , elevated ammonia,      Medications Ordered in ED Medications  potassium chloride 10 mEq in 100 mL IVPB (10 mEq Intravenous New Bag/Given 11/14/20 2317)  LORazepam (ATIVAN) injection 0.5 mg (0.5 mg Intravenous Given 11/14/20 2241)  0.9 %  sodium chloride infusion ( Intravenous New Bag/Given 11/14/20 2310)  erythromycin ophthalmic ointment (has no administration in time range)  feeding supplement (BOOST / RESOURCE BREEZE) liquid 1 Container (has no administration in time range)  0.9 %  sodium chloride infusion (250 mLs Intravenous New Bag/Given 11/14/20 2315)  sodium chloride 0.9 % bolus 1,000 mL (0 mLs Intravenous Stopped 11/14/20 1841)  magnesium sulfate IVPB 2 g 50 mL (0 g Intravenous Stopped 11/14/20 1918)  iohexol (OMNIPAQUE) 300 MG/ML solution 100 mL (100 mLs Intravenous Contrast Given 11/14/20 1749)  lactulose (CHRONULAC) 10 GM/15ML solution 30 g (30 g Oral Given 11/14/20 1843)    ED Course  I have reviewed the triage vital signs and the nursing notes.  Pertinent labs & imaging results that were available during my care of the patient were reviewed by me and considered in my medical decision making (see chart for details).  Clinical Course as of 11/14/20 2333  Fri Nov 14, 2020  1630 Potassium(!!): 2.2 IV K ordered. Mag pending.  [EH]  [id number] I spoke with Dr. Flossie Buffy who will see patient.  [EH]    Clinical Course User Index [EH] Ollen Gross   MDM Rules/Calculators/A&P                         Patient is a 60 year old woman who presents today for evaluation of emesis and abdominal pain.  She has had worsening abdominal pain and been unable to tolerate  most p.o. for 2 weeks with development of jaundice in the past 24 hours. On my exam she appears chronically ill. She has multiple significant  electrolyte abnormalities including a potassium of 2.2 requiring multiple rounds of IV replacement.  Magnesium is low at 1.3 requiring IV replacement.  CMP additionally shows total bili is elevated at 9.4.  Mild acidotic with a CO2 of 18.  She has an anion gap of 21 which I suspect is secondary to emesis.  She has new EKG changes in the form of prolonged QT interval which I suspect is secondary to her multiple significant electrolyte derangements.   Lipase is slightly elevated at 99, and CK is elevated at 537.  Additionally she had elevated ammonia at 125.  Lactulose is ordered.  Covid testing is negative.  UA and UDS are unremarkable. She was given IV fluids while in the emergency room.  CT abdomen pelvis with out clear cause for her symptoms.    I spoke with Dr. Flossie Buffy who will see patient for admission.   The patient appears reasonably stabilized for admission considering the current resources, flow, and capabilities available in the ED at this time, and I doubt any other [facility] requiring further screening and/or treatment in the ED prior to admission assuming timely admission and bed placement.  Note: Portions of this report may have been transcribed using voice recognition software. Every effort was made to ensure accuracy; however, inadvertent computerized transcription errors may be present   Final Clinical Impression(s) / ED Diagnoses Final diagnoses:  Hypokalemia  Hypomagnesemia  Jaundice  Dehydration  Prolonged Q-T interval on ECG    Rx / DC Orders ED Discharge Orders    None       Ollen Gross 11/14/20 2336    Drenda Freeze, MD 11/15/20 [phone]

## 2020-11-14 NOTE — ED Triage Notes (Signed)
Patient  c/o right abdominal pain, N/V and stools that are mushy x 6 weeks. Patient states she has not been able to bathe herself in the past 2 weeks due to fatigue. Patient states, "every muscle in my body contracts."

## 2020-11-14 NOTE — H&P (Addendum)
History and Physical    Judy Meyer:[id number] DOB: [birth year] DOA: 11/14/2020  PCP: Pcp, No  Patient coming from: Home, boyfriend at bedside  I have personally briefly reviewed patient's old medical records in [place]  Chief Complaint: Intractable nausea and vomiting, fatigue  HPI: Judy Meyer is a 60 y.o. 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  About 6 weeks ago she began to have waxing and waning nausea and vomiting.  For the past several weeks this has become more frequent and daily.  Has noticed some bilious fluid and at times blood-tinged after she has vomited repeatedly.  About a week ago she also developed worsening right upper quadrant pain that is worse with palpation but otherwise is always constant irregardless of food.  She denies any fever but has chills and night sweats especially after vomiting.  Has been constipated and not had a regular bowel movement for the past 6 weeks.  Per boyfriend, she also seems to be more forgetful and in the last 2 days had more icterus and jaundice.  The last week she is also felt more dizzy both at rest and when she moves around quickly. Denies any recent alcohol use. She was also noted to have crusting and redness with burning sensation around both eyes that she is unsure how long it has been there.  States she has had difficulty taking care of herself with how weak she has felt with all of this persistent vomiting.  Her symptoms all began in an admission back in December where she was admitted for worsening nausea with vomiting and jaundice.  She underwent abdominal ultrasound which showed gallbladder stones and sludge.  Subsequent MRCP on 12/13 did not appreciate gallstones, showed no biliary ductal dilatation or stricture, no focal liver lesions but did note  hepatomegaly.  GI was consulted and she underwent ultrasound-guided liver biopsy which was consistent with severe hepatic steatosis intrahepatic cholestasis.   Had additional serology work-up that was negative for viral hepatitis, CMV and EBV.  ANA, AMA and ASMA were negative.  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  Also had ceruloplasmin below the lower limits of normal at 17.2.  Hepatology team at [facility] was consulted and recommended she be discharged on prednisone and ursodiol.  Patient had a working diagnosis of sclerosing cholangitis versus drug-induced liver injury.  Patient then had a follow-up with [facility] and transplant office in [place] and had further testing with urine copper which she states results were normal.  They had low suspicion of drug-induced hepatic injury. She last saw them in January and has since missed her follow up due to being sick with repeated symptoms.  ED Course: She was afebrile, mildly hypertensive up to 160s on room air.  CBC unremarkable.  Sodium of 132, potassium of 2.2.  AST of 147 and ALT of 22.  Total bilirubin of 9.4 which is improved from 15 from 3 months ago.  Anion gap of 21.  CK of 537.  Ammonia of 125.  CT abdomen showed no new acute findings other than possible mild gastritis  Review of Systems: Constitutional: No Weight Change, No Fever ENT/Mouth: No sore throat, No Rhinorrhea Eyes: No Eye Pain, No Vision Changes Cardiovascular: No Chest Pain, no SOB Respiratory: No Cough, No Sputum, No Wheezing, no Dyspnea  Gastrointestinal: + Nausea, +  Vomiting, No Diarrhea, No Constipation, + Pain Genitourinary: no Urinary Incontinence, No Urgency, No Flank Pain Musculoskeletal: No Arthralgias, No Myalgias Skin: No Skin Lesions, No Pruritus, Neuro: + Weakness, No Numbness,  No Loss of Consciousness, No Syncope Psych: No Anxiety/Panic, No Depression,+ decrease appetite Heme/Lymph: No Bruising, No  Bleeding  Past Medical History:  Diagnosis Date  . Hypertension   . Hypothyroidism   . Liver disease   . Uterine fibroid     Past Surgical History:  Procedure Laterality Date  . hand fracture surgery Right   . LIVER BIOPSY       reports that she has never smoked. She has never used smokeless tobacco. She reports previous alcohol use. She reports that she does not use drugs. Social History  Allergies  Allergen Reactions  . Compazine [Prochlorperazine Edisylate] Anaphylaxis  . Periactin [Cyproheptadine] Anaphylaxis  . Cyclobenzaprine     Palpations   . Haloperidol And Related   . Metoclopramide Other (See Comments)  . Other Other (See Comments)  . Prochlorperazine Other (See Comments)    Family History  Problem Relation Age of Onset  . Hypertension Other   . Hypertension Mother   . High Cholesterol Mother      Prior to Admission medications   Medication Sig Start Date End Date Taking? Authorizing Provider  albuterol (VENTOLIN HFA) 108 (90 Base) MCG/ACT inhaler Inhale 2 puffs into the lungs 4 (four) times daily as needed for wheezing or shortness of breath. 12/20/19  Yes [provider]  amLODipine (NORVASC) 5 MG tablet Take 5 mg by mouth daily.   Yes [provider]  Galcanezumab-gnlm 120 MG/ML SOAJ Inject 120 mLs into the skin every 30 (thirty) days.   Yes [provider]  levothyroxine (SYNTHROID) 25 MCG tablet Take 25 mcg by mouth every morning. 08/19/20  Yes [provider]  pantoprazole (PROTONIX) 40 MG tablet Take 1 tablet by mouth daily. 11/07/20  Yes [provider]  venlafaxine XR (EFFEXOR-XR) 75 MG 24 hr capsule Take 75 mg by mouth daily.   Yes [provider]  amLODipine (NORVASC) 5 MG tablet Take 1 tablet (5 mg total) by mouth daily. 08/06/20 09/05/20  Bonnielee Haff, MD  famotidine (PEPCID) 20 MG tablet Take 1 tablet (20 mg total) by mouth daily. Patient not taking: Reported on 11/14/2020 08/07/20   Bonnielee Haff, MD  folic acid (FOLVITE) 1 MG tablet Take 1 tablet (1 mg total) by mouth daily. Patient not taking: Reported on 11/14/2020 08/07/20   Bonnielee Haff, MD  oxyCODONE (OXY IR/ROXICODONE) 5 MG immediate release tablet Take 1-2 tablets (5-10 mg total) by mouth every 6 (six) hours as needed for severe pain. Patient not taking: Reported on 11/14/2020 08/06/20   Bonnielee Haff, MD  polyethylene glycol (MIRALAX / GLYCOLAX) 17 g packet Take 17 g by mouth daily. Patient not taking: Reported on 11/14/2020 08/07/20   Bonnielee Haff, MD  prednisoLONE 5 MG TABS tablet Take 8 tablets (40 mg total) by mouth daily. Patient not taking: Reported on 11/14/2020 08/07/20   Bonnielee Haff, MD  thiamine 100 MG tablet Take 1 tablet (100 mg total) by mouth daily. Patient not taking: No sig reported 08/07/20   Bonnielee Haff, MD  ursodiol (ACTIGALL) 300 MG capsule Take 2 capsules (600 mg total) by mouth 2 (two) times daily. Patient not taking: Reported on 11/14/2020 08/06/20   Bonnielee Haff, MD    Physical Exam: Vitals:   11/14/20 1930 11/14/20 2000 11/14/20 2030 11/14/20 2100  BP: Marland Kitchen)  148/80 130/82 (!) 151/95 (!) 146/80  Pulse: 70 69 95 70  Resp: 15 18 17 19   Temp:      TempSrc:      SpO2: 99% 99% 100% 98%  Weight:      Height:        Constitutional: NAD, ill-appearing middle-age female laying in bed with sclera icterus and generalized jaundice Vitals:   11/14/20 1930 11/14/20 2000 11/14/20 2030 11/14/20 2100  BP: (!) 148/80 130/82 (!) 151/95 (!) 146/80  Pulse: 70 69 95 70  Resp: 15 18 17 19   Temp:      TempSrc:      SpO2: 99% 99% 100% 98%  Weight:      Height:       Eyes: PERRL, crusting and erythema of bilateral eyelid with scleral icterus ENMT: Dry mucous membranes.   Neck: normal, supple Respiratory: clear to auscultation bilaterally, no wheezing, no crackles. Normal respiratory effort. No accessory muscle use.  Cardiovascular: Regular rate and rhythm, no murmurs / rubs / gallops. No  extremity edema.   Abdomen: Diffuse abdominal tenderness worse to mid and right upper quadrant without any rebound, guarding or rigidity, no masses palpated.  Bowel sounds positive.  Musculoskeletal: no clubbing / cyanosis. No joint deformity upper and lower extremities. Good ROM, no contractures. Normal muscle tone.  Skin: Generalized jaundice Neurologic: CN 2-12 grossly intact. Sensation intact,Strength 5/5 in all 4.  Psychiatric: Normal judgment and insight. Alert and oriented x 3. Normal mood.    Labs on Admission: I have personally reviewed following labs and imaging studies  CBC: Recent Labs  Lab 11/14/20 1525  WBC 7.9  HGB 13.4  HCT 41.0  MCV 94.9  PLT 413   Basic Metabolic Panel: Recent Labs  Lab 11/14/20 1525 11/14/20 1626  NA 132*  --   K 2.2*  --   CL 93*  --   CO2 18*  --   GLUCOSE 95  --   BUN 6  --   CREATININE 0.88  --   CALCIUM 8.1*  --   MG  --  1.3*   GFR: Estimated Creatinine Clearance: 65.2 mL/min (by C-G formula based on SCr of 0.88 mg/dL). Liver Function Tests: Recent Labs  Lab 11/14/20 1525  AST 147*  ALT 22  ALKPHOS 108  BILITOT 9.4*  PROT 7.4  ALBUMIN 3.5   Recent Labs  Lab 11/14/20 1525  LIPASE 99*   Recent Labs  Lab 11/14/20 1626  AMMONIA 125*   Coagulation Profile: Recent Labs  Lab 11/14/20 1525  INR 1.2   Cardiac Enzymes: Recent Labs  Lab 11/14/20 1525  CKTOTAL 537*   BNP (last 3 results) No results for input(s): PROBNP in the last 8760 hours. HbA1C: No results for input(s): HGBA1C in the last 72 hours. CBG: Recent Labs  Lab 11/14/20 1527  GLUCAP 86   Lipid Profile: No results for input(s): CHOL, HDL, LDLCALC, TRIG, CHOLHDL, LDLDIRECT in the last 72 hours. Thyroid Function Tests: No results for input(s): TSH, T4TOTAL, FREET4, T3FREE, THYROIDAB in the last 72 hours. Anemia Panel: No results for input(s): VITAMINB12, FOLATE, FERRITIN, TIBC, IRON, RETICCTPCT in the last 72 hours. Urine analysis:     Component Value Date/Time   COLORURINE RED (A) 07/17/2020 2100   APPEARANCEUR CLOUDY (A) 07/17/2020 2100   LABSPEC  07/17/2020 2100    TEST NOT REPORTED DUE TO COLOR INTERFERENCE OF URINE PIGMENT   PHURINE  07/17/2020 2100    TEST NOT REPORTED DUE TO COLOR INTERFERENCE  OF URINE PIGMENT   GLUCOSEU (A) 07/17/2020 2100    TEST NOT REPORTED DUE TO COLOR INTERFERENCE OF URINE PIGMENT   HGBUR (A) 07/17/2020 2100    TEST NOT REPORTED DUE TO COLOR INTERFERENCE OF URINE PIGMENT   BILIRUBINUR (A) 07/17/2020 2100    TEST NOT REPORTED DUE TO COLOR INTERFERENCE OF URINE PIGMENT   KETONESUR (A) 07/17/2020 2100    TEST NOT REPORTED DUE TO COLOR INTERFERENCE OF URINE PIGMENT   PROTEINUR (A) 07/17/2020 2100    TEST NOT REPORTED DUE TO COLOR INTERFERENCE OF URINE PIGMENT   NITRITE (A) 07/17/2020 2100    TEST NOT REPORTED DUE TO COLOR INTERFERENCE OF URINE PIGMENT   LEUKOCYTESUR (A) 07/17/2020 2100    TEST NOT REPORTED DUE TO COLOR INTERFERENCE OF URINE PIGMENT    Radiological Exams on Admission: CT Abdomen Pelvis W Contrast  Result Date: 11/14/2020 CLINICAL DATA:  Nausea vomiting epigastric EXAM: CT ABDOMEN AND PELVIS WITH CONTRAST TECHNIQUE: Multidetector CT imaging of the abdomen and pelvis was performed using the standard protocol following bolus administration of intravenous contrast. CONTRAST:  173mL OMNIPAQUE IOHEXOL 300 MG/ML  SOLN COMPARISON:  None. FINDINGS: Lower chest: The visualized heart size within normal limits. No pericardial fluid/thickening. No hiatal hernia. The visualized portions of the lungs are clear. Hepatobiliary: There is diffuse low density seen throughout the liver parenchyma with mild hepatomegaly.The main portal vein is patent. No evidence of calcified gallstones, gallbladder wall thickening or biliary dilatation. Fluid-filled dilated gallbladder is again noted. Pancreas: Unremarkable. No pancreatic ductal dilatation or surrounding inflammatory changes. Spleen: Normal in size  without focal abnormality. Adrenals/Urinary Tract: Both adrenal glands appear normal. There is a 3 cm low-density lesion seen in the upper pole of the right kidney. A punctate 4 mm calculus seen in lower pole of the right kidney. There is a punctate calcifications seen in the upper pole of the left kidney. The bladder is unremarkable. No hydronephrosis. Stomach/Bowel: There is apparent mild wall thickening seen within the mid body of the stomach. However no surrounding inflammatory changes are seen. The small bowel, and colon are normal in appearance. No inflammatory changes, wall thickening, or obstructive findings. Vascular/Lymphatic: There are no enlarged mesenteric, retroperitoneal, or pelvic lymph nodes. No significant vascular findings are present. Reproductive: Again noted is a fibroid uterus some with partial internal calcifications noted. Other: No evidence of abdominal wall mass or hernia. Musculoskeletal: No acute or significant osseous findings. IMPRESSION: Hepatic steatosis and mild hepatomegaly Unchanged gallbladder hydrops Apparent wall thickening of the mid body of stomach which may be due to physiologic under distension versus mild gastritis Unchanged fibroid uterus Electronically Signed   By: Prudencio Pair M.D.   On: 11/14/2020 18:23      Assessment/Plan  Intactable nausea and vomiting  -can only give low dose ativan due to prolonged QT. She also has many allergies to antiemetics  -Suspect this is due to her elevated total bilirubin and hepatic disease of unknown origin -Had admission back in December where she underwent ultrasound-guided liver biopsy which was consistent with severe hepatic steatosis intrahepatic cholestasis.   -Had additional serology work-up that was negative for viral hepatitis, CMV and EBV.  ANA, AMA and ASMA were negative.  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  Also had ceruloplasmin below the lower limits of normal at  17.2. -Patient has been following with liver care and transplant office outpatient with no significant findings to explain her symptoms - need GI consult  in the morning  Prolonged QT repeat EKG in the morning Avoid QT prolongation meds  Mild hepatic encephalopathy Boyfriend at bedside endorse the patient has been more forgetful recently Ammonia level was elevated at 125.  Given lactulose in the ED. Follow repeat ammonia level in the morning  Hypokalemia Replete with IV K  Hypomagnesemia replete  Elevated total bilirubin T bili of 9.4 which is improving from 15  Elevated CK 537 on admission Repeat in the morning after fluids  Bacterial conjunctivitis Erythromycin ointment q6hrs  Hypertension Continue amlodipine  Hypothyroidism Continue levothyroxine  Depression Hold venlafaxine briefly due to prolonged QT.  Can resume pending results of morning EKG   DVT prophylaxis:.Lovenox Code Status: Full Family Communication: Plan discussed with patient and boyfriend at bedside  disposition Plan: Home with at least 2 midnight stays  Consults called:  Admission status: inpatient  Level of care: Med-Surg  Status is: Inpatient  Remains inpatient appropriate because:Inpatient level of care appropriate due to severity of illness   Dispo: The patient is from: Home              Anticipated d/c is to: Home              Patient currently is not medically stable to d/c.   Difficult to place patient No         Orene Desanctis DO [facility]   If 7PM-7AM, please contact night-coverage [email or web address]   11/14/2020, 9:38 PM

## 2020-11-14 NOTE — ED Notes (Signed)
ED TO INPATIENT HANDOFF REPORT  Name/Age/Gender Judy Meyer 60 y.o. female  Code Status    Code Status Orders  (From admission, onward)         Start     Ordered   11/14/20 2137  Full code  Continuous        11/14/20 2136        Code Status History    Date Active Date Inactive Code Status Order ID Comments User Context   07/18/2020 0336 08/07/2020 0140 Full Code [id number]  Toy Baker, MD Inpatient   04/06/2020 2032 04/08/2020 0226 Full Code [id number]  Elwyn Reach, MD ED   Advance Care Planning Activity      Home/SNF/Other Home  Chief Complaint Intractable vomiting with nausea [R11.2]  Level of Care/Admitting Diagnosis ED Disposition    ED Disposition Condition [place]: [facility] [026378]  Level of Care: Med-Surg [16]  May admit patient to [facility] or [facility] if equivalent level of care is available:: No  Covid Evaluation: Asymptomatic Screening Protocol (No Symptoms)  Diagnosis: Intractable vomiting with nausea [5885027]  Admitting Physician: Orene Desanctis [[id number]]  Attending Physician: Orene Desanctis [[id number]]  Estimated length of stay: past midnight tomorrow  Certification:: I certify this patient will need inpatient services for at least 2 midnights       Medical History Past Medical History:  Diagnosis Date  . Hypertension   . Hypothyroidism   . Liver disease   . Uterine fibroid     Allergies Allergies  Allergen Reactions  . Compazine [Prochlorperazine Edisylate] Anaphylaxis  . Periactin [Cyproheptadine] Anaphylaxis  . Cyclobenzaprine     Palpations   . Haloperidol And Related   . Metoclopramide Other (See Comments)  . Other Other (See Comments)  . Prochlorperazine Other (See Comments)    IV Location/Drains/Wounds Patient Lines/Drains/Airways Status    Active Line/Drains/Airways    Name Placement date Placement time Site Days   Peripheral IV 11/14/20 Left Hand 11/14/20  1635   Hand  less than 1   Peripheral IV 11/14/20 Anterior;Right Forearm 11/14/20  1813  Forearm  less than 1          Labs/Imaging Results for orders placed or performed during the hospital encounter of 11/14/20 (from the past 48 hour(s))  Lipase, blood     Status: Abnormal   Collection Time: 11/14/20  3:25 PM  Result Value Ref Range   Lipase 99 (H) 11 - 51 U/L    Comment: Performed at [facility], [place] [address], [place], [place] [address]  Comprehensive metabolic panel     Status: Abnormal   Collection Time: 11/14/20  3:25 PM  Result Value Ref Range   Sodium 132 (L) 135 - 145 mmol/L   Potassium 2.2 (LL) 3.5 - 5.1 mmol/L    Comment: CRITICAL RESULT CALLED TO, READ BACK BY AND VERIFIED WITH: BANNO,A. RN @1627  ON 04.08.2022 BY COHEN,K    Chloride 93 (L) 98 - 111 mmol/L   CO2 18 (L) 22 - 32 mmol/L   Glucose, Bld 95 70 - 99 mg/dL    Comment: Glucose reference range applies only to samples taken after fasting for at least 8 hours.   BUN 6 6 - 20 mg/dL   Creatinine, Ser 0.88 0.44 - 1.00 mg/dL   Calcium 8.1 (L) 8.9 - 10.3 mg/dL   Total Protein 7.4 6.5 - 8.1 g/dL   Albumin 3.5 3.5 - 5.0 g/dL  AST 147 (H) 15 - 41 U/L   ALT 22 0 - 44 U/L   Alkaline Phosphatase 108 38 - 126 U/L   Total Bilirubin 9.4 (H) 0.3 - 1.2 mg/dL   GFR, Estimated >60 >60 mL/min    Comment: (NOTE) Calculated using the CKD-EPI Creatinine Equation (2021)    Anion gap 21 (H) 5 - 15    Comment: Performed at [facility], [place] [address] [place], [place] [address]  CBC     Status: Abnormal   Collection Time: 11/14/20  3:25 PM  Result Value Ref Range   WBC 7.9 4.0 - 10.5 K/uL   RBC 4.32 3.87 - 5.11 MIL/uL   Hemoglobin 13.4 12.0 - 15.0 g/dL   HCT 41.0 36.0 - 46.0 %   MCV 94.9 80.0 - 100.0 fL   MCH 31.0 26.0 - 34.0 pg   MCHC 32.7 30.0 - 36.0 g/dL   RDW 20.4 (H) 11.5 - 15.5 %   Platelets 171 150 - 400 K/uL   nRBC 0.3 (H) 0.0 - 0.2 %    Comment: Performed at [facility], [place] [address], [place], [place] [address]  Protime-INR     Status: None   Collection Time: 11/14/20  3:25 PM  Result Value Ref Range   Prothrombin Time 14.9 11.4 - 15.2 seconds   INR 1.2 0.8 - 1.2    Comment: (NOTE) INR goal varies based on device and disease states. Performed at [facility], [place] [address], [place], [place] [address]   CK     Status: Abnormal   Collection Time: 11/14/20  3:25 PM  Result Value Ref Range   Total CK 537 (H) 38 - 234 U/L    Comment: Performed at [facility], [place] [address], [place], [place] [address]  POC CBG, ED     Status: None   Collection Time: 11/14/20  3:27 PM  Result Value Ref Range   Glucose-Capillary 86 70 - 99 mg/dL    Comment: Glucose reference range applies only to samples taken after fasting for at least 8 hours.  Ammonia     Status: Abnormal   Collection Time: 11/14/20  4:26 PM  Result Value Ref Range   Ammonia 125 (H) 9 - 35 umol/L    Comment: Performed at [facility], [place] [address], [place], [place] [address]  Magnesium     Status: Abnormal   Collection Time: 11/14/20  4:26 PM  Result Value Ref Range   Magnesium 1.3 (L) 1.7 - 2.4 mg/dL    Comment: Performed at [facility], [place] [address], [place], [place] [address]  Resp Panel by RT-PCR (Flu A&B, Covid) Nasopharyngeal Swab     Status: None   Collection Time: 11/14/20  6:41 PM   Specimen: Nasopharyngeal Swab; Nasopharyngeal(NP) swabs in vial transport medium  Result Value Ref Range   SARS Coronavirus 2 by RT PCR NEGATIVE NEGATIVE    Comment: (NOTE) SARS-CoV-2 target nucleic acids are NOT DETECTED.  The SARS-CoV-2 RNA is generally detectable in upper respiratory specimens during the acute phase of infection. The lowest concentration of SARS-CoV-2 viral copies this assay can detect is 138 copies/mL. A negative result does not preclude SARS-Cov-2 infection and should not be used  as the sole basis for treatment or other patient management decisions. A negative result may occur with  improper specimen collection/handling, submission of specimen other than nasopharyngeal swab, presence of viral mutation(s) within the areas  targeted by this assay, and inadequate number of viral copies(<138 copies/mL). A negative result must be combined with clinical observations, patient history, and epidemiological information. The expected result is Negative.  Fact Sheet for Patients:  EntrepreneurPulse.com.au  Fact Sheet for Healthcare Providers:  IncredibleEmployment.be  This test is no t yet approved or cleared by the [place] FDA and  has been authorized for detection and/or diagnosis of SARS-CoV-2 by FDA under an Emergency Use Authorization (EUA). This EUA will remain  in effect (meaning this test can be used) for the duration of the COVID-19 declaration under Section 564(b)(1) of the Act, 21 U.S.C.section 360bbb-3(b)(1), unless the authorization is terminated  or revoked sooner.       Influenza A by PCR NEGATIVE NEGATIVE   Influenza B by PCR NEGATIVE NEGATIVE    Comment: (NOTE) The Xpert Xpress SARS-CoV-2/FLU/RSV plus assay is intended as an aid in the diagnosis of influenza from Nasopharyngeal swab specimens and should not be used as a sole basis for treatment. Nasal washings and aspirates are unacceptable for Xpert Xpress SARS-CoV-2/FLU/RSV testing.  Fact Sheet for Patients: EntrepreneurPulse.com.au  Fact Sheet for Healthcare Providers: IncredibleEmployment.be  This test is not yet approved or cleared by the [place] FDA and has been authorized for detection and/or diagnosis of SARS-CoV-2 by FDA under an Emergency Use Authorization (EUA). This EUA will remain in effect (meaning this test can be used) for the duration of the COVID-19 declaration under Section 564(b)(1) of the Act,  21 U.S.C. section 360bbb-3(b)(1), unless the authorization is terminated or revoked.  Performed at [facility], [place] [address], [place], [place] [address]   Urinalysis, Routine w reflex microscopic     Status: Abnormal   Collection Time: 11/14/20  8:40 PM  Result Value Ref Range   Color, Urine AMBER (A) YELLOW    Comment: BIOCHEMICALS MAY BE AFFECTED BY COLOR   APPearance CLEAR CLEAR   Specific Gravity, Urine 1.027 1.005 - 1.030   pH 8.0 5.0 - 8.0   Glucose, UA NEGATIVE NEGATIVE mg/dL   Hgb urine dipstick SMALL (A) NEGATIVE   Bilirubin Urine NEGATIVE NEGATIVE   Ketones, ur NEGATIVE NEGATIVE mg/dL   Protein, ur NEGATIVE NEGATIVE mg/dL   Nitrite NEGATIVE NEGATIVE   Leukocytes,Ua NEGATIVE NEGATIVE   RBC / HPF 0-5 0 - 5 RBC/hpf   WBC, UA 0-5 0 - 5 WBC/hpf   Bacteria, UA NONE SEEN NONE SEEN   Squamous Epithelial / LPF 0-5 0 - 5    Comment: Performed at [facility], [place] [address] [place], [place] [address]  Urine rapid drug screen (hosp performed)     Status: None   Collection Time: 11/14/20  8:40 PM  Result Value Ref Range   Opiates NONE DETECTED NONE DETECTED   Cocaine NONE DETECTED NONE DETECTED   Benzodiazepines NONE DETECTED NONE DETECTED   Amphetamines NONE DETECTED NONE DETECTED   Tetrahydrocannabinol NONE DETECTED NONE DETECTED   Barbiturates NONE DETECTED NONE DETECTED    Comment: (NOTE) DRUG SCREEN FOR MEDICAL PURPOSES ONLY.  IF CONFIRMATION IS NEEDED FOR ANY PURPOSE, NOTIFY LAB WITHIN 5 DAYS.  LOWEST DETECTABLE LIMITS FOR URINE DRUG SCREEN Drug Class                     Cutoff (ng/mL) Amphetamine and metabolites    1000 Barbiturate and metabolites    200 Benzodiazepine                 924 Tricyclics  and metabolites     300 Opiates and metabolites        300 Cocaine and metabolites        300 THC                            50 Performed at [facility], [place] [address] [place], [place] [address]     CT Abdomen Pelvis W Contrast  Result Date: 11/14/2020 CLINICAL DATA:  Nausea vomiting epigastric EXAM: CT ABDOMEN AND PELVIS WITH CONTRAST TECHNIQUE: Multidetector CT imaging of the abdomen and pelvis was performed using the standard protocol following bolus administration of intravenous contrast. CONTRAST:  187mL OMNIPAQUE IOHEXOL 300 MG/ML  SOLN COMPARISON:  None. FINDINGS: Lower chest: The visualized heart size within normal limits. No pericardial fluid/thickening. No hiatal hernia. The visualized portions of the lungs are clear. Hepatobiliary: There is diffuse low density seen throughout the liver parenchyma with mild hepatomegaly.The main portal vein is patent. No evidence of calcified gallstones, gallbladder wall thickening or biliary dilatation. Fluid-filled dilated gallbladder is again noted. Pancreas: Unremarkable. No pancreatic ductal dilatation or surrounding inflammatory changes. Spleen: Normal in size without focal abnormality. Adrenals/Urinary Tract: Both adrenal glands appear normal. There is a 3 cm low-density lesion seen in the upper pole of the right kidney. A punctate 4 mm calculus seen in lower pole of the right kidney. There is a punctate calcifications seen in the upper pole of the left kidney. The bladder is unremarkable. No hydronephrosis. Stomach/Bowel: There is apparent mild wall thickening seen within the mid body of the stomach. However no surrounding inflammatory changes are seen. The small bowel, and colon are normal in appearance. No inflammatory changes, wall thickening, or obstructive findings. Vascular/Lymphatic: There are no enlarged mesenteric, retroperitoneal, or pelvic lymph nodes. No significant vascular findings are present. Reproductive: Again noted is a fibroid uterus some with partial internal calcifications noted. Other: No evidence of abdominal wall mass or hernia. Musculoskeletal: No acute or significant osseous findings. IMPRESSION: Hepatic steatosis and mild  hepatomegaly Unchanged gallbladder hydrops Apparent wall thickening of the mid body of stomach which may be due to physiologic under distension versus mild gastritis Unchanged fibroid uterus Electronically Signed   By: Prudencio Pair M.D.   On: 11/14/2020 18:23    Pending Labs Unresulted Labs (From admission, onward)          Start     Ordered   11/15/20 0500  Comprehensive metabolic panel  Tomorrow morning,   R        11/14/20 2136   11/15/20 0500  CBC  Tomorrow morning,   R        11/14/20 2136          Vitals/Pain Today's Vitals   11/14/20 1930 11/14/20 2000 11/14/20 2030 11/14/20 2100  BP: (!) 148/80 130/82 (!) 151/95 (!) 146/80  Pulse: 70 69 95 70  Resp: 15 18 17 19   Temp:      TempSrc:      SpO2: 99% 99% 100% 98%  Weight:      Height:      PainSc:        Isolation Precautions No active isolations  Medications Medications  potassium chloride 10 mEq in 100 mL IVPB (10 mEq Intravenous New Bag/Given 11/14/20 2108)  LORazepam (ATIVAN) injection 0.5 mg (has no administration in time range)  0.9 %  sodium chloride infusion (has no administration in time range)  erythromycin ophthalmic ointment (has  no administration in time range)  sodium chloride 0.9 % bolus 1,000 mL (0 mLs Intravenous Stopped 11/14/20 1841)  magnesium sulfate IVPB 2 g 50 mL (0 g Intravenous Stopped 11/14/20 1918)  iohexol (OMNIPAQUE) 300 MG/ML solution 100 mL (100 mLs Intravenous Contrast Given 11/14/20 1749)  lactulose (CHRONULAC) 10 GM/15ML solution 30 g (30 g Oral Given 11/14/20 1843)    Mobility walks with person assist

## 2020-11-14 NOTE — Progress Notes (Signed)
Describing pain 8/10 right upper quad of abdomen that has been treated with morphine when in the hospital for her liver problems. Text to On-Call of no pain meds ordered.

## 2020-11-15 ENCOUNTER — Inpatient Hospital Stay (HOSPITAL_COMMUNITY): Payer: Medicare PPO

## 2020-11-15 DIAGNOSIS — M6282 Rhabdomyolysis: Secondary | ICD-10-CM

## 2020-11-15 DIAGNOSIS — E872 Acidosis: Secondary | ICD-10-CM

## 2020-11-15 DIAGNOSIS — R748 Abnormal levels of other serum enzymes: Secondary | ICD-10-CM

## 2020-11-15 DIAGNOSIS — R7401 Elevation of levels of liver transaminase levels: Secondary | ICD-10-CM

## 2020-11-15 DIAGNOSIS — K831 Obstruction of bile duct: Secondary | ICD-10-CM

## 2020-11-15 DIAGNOSIS — E871 Hypo-osmolality and hyponatremia: Secondary | ICD-10-CM

## 2020-11-15 DIAGNOSIS — H109 Unspecified conjunctivitis: Secondary | ICD-10-CM

## 2020-11-15 LAB — BILIRUBIN, FRACTIONATED(TOT/DIR/INDIR)
Bilirubin, Direct: 4.7 mg/dL — ABNORMAL HIGH (ref 0.0–0.2)
Indirect Bilirubin: 3.3 mg/dL — ABNORMAL HIGH (ref 0.3–0.9)
Total Bilirubin: 8 mg/dL — ABNORMAL HIGH (ref 0.3–1.2)

## 2020-11-15 LAB — COMPREHENSIVE METABOLIC PANEL
ALT: 19 U/L (ref 0–44)
ALT: 22 U/L (ref 0–44)
AST: 131 U/L — ABNORMAL HIGH (ref 15–41)
AST: 124 U/L — ABNORMAL HIGH (ref 15–41)
Albumin: 3 g/dL — ABNORMAL LOW (ref 3.5–5.0)
Albumin: 3 g/dL — ABNORMAL LOW (ref 3.5–5.0)
Alkaline Phosphatase: 93 U/L (ref 38–126)
Alkaline Phosphatase: 92 U/L (ref 38–126)
Anion gap: 14 (ref 5–15)
Anion gap: 14 (ref 5–15)
BUN: <5 mg/dL — ABNORMAL LOW (ref 6–20)
BUN: <5 mg/dL — ABNORMAL LOW (ref 6–20)
CO2: 19 mmol/L — ABNORMAL LOW (ref 22–32)
CO2: 17 mmol/L — ABNORMAL LOW (ref 22–32)
Calcium: 7.4 mg/dL — ABNORMAL LOW (ref 8.9–10.3)
Calcium: 8.3 mg/dL — ABNORMAL LOW (ref 8.9–10.3)
Chloride: 101 mmol/L (ref 98–111)
Chloride: 103 mmol/L (ref 98–111)
Creatinine, Ser: 0.77 mg/dL (ref 0.44–1.00)
Creatinine, Ser: 0.81 mg/dL (ref 0.44–1.00)
GFR, Estimated: >60 mL/min (ref >60)
GFR, Estimated: >60 mL/min (ref >60)
Glucose, Bld: 109 mg/dL — ABNORMAL HIGH (ref 70–99)
Glucose, Bld: 125 mg/dL — ABNORMAL HIGH (ref 70–99)
Potassium: 2.3 mmol/L — CL (ref 3.5–5.1)
Potassium: 3.8 mmol/L (ref 3.5–5.1)
Sodium: 134 mmol/L — ABNORMAL LOW (ref 135–145)
Sodium: 134 mmol/L — ABNORMAL LOW (ref 135–145)
Total Bilirubin: 8.1 mg/dL — ABNORMAL HIGH (ref 0.3–1.2)
Total Bilirubin: 8.1 mg/dL — ABNORMAL HIGH (ref 0.3–1.2)
Total Protein: 6.4 g/dL — ABNORMAL LOW (ref 6.5–8.1)
Total Protein: 6.1 g/dL — ABNORMAL LOW (ref 6.5–8.1)

## 2020-11-15 LAB — CBC
HCT: 37.5 % (ref 36.0–46.0)
Hemoglobin: 12.2 g/dL (ref 12.0–15.0)
MCH: 31.1 pg (ref 26.0–34.0)
MCHC: 32.5 g/dL (ref 30.0–36.0)
MCV: 95.7 fL (ref 80.0–100.0)
Platelets: 133 10*3/uL — ABNORMAL LOW (ref 150–400)
RBC: 3.92 MIL/uL (ref 3.87–5.11)
RDW: 20.7 % — ABNORMAL HIGH (ref 11.5–15.5)
WBC: 6.2 10*3/uL (ref 4.0–10.5)
nRBC: 0 % (ref 0.0–0.2)

## 2020-11-15 LAB — TSH: TSH: 42.829 u[IU]/mL — ABNORMAL HIGH (ref 0.350–4.500)

## 2020-11-15 LAB — AMMONIA: Ammonia: 87 umol/L — ABNORMAL HIGH (ref 9–35)

## 2020-11-15 LAB — MAGNESIUM: Magnesium: 1.7 mg/dL (ref 1.7–2.4)

## 2020-11-15 LAB — CK: Total CK: 428 U/L — ABNORMAL HIGH (ref 38–234)

## 2020-11-15 IMAGING — US US ABDOMEN LIMITED RUQ/ASCITES
1 series · 14 of 25 positions shown · non-contrast
Comparison: [DATE]

CLINICAL DATA: Right upper quadrant pain for 1 week.

EXAM:
ULTRASOUND ABDOMEN LIMITED RIGHT UPPER QUADRANT

[Series 1: us abdomen limited ruq/ascites · 14 of 38 slices shown]
[im 1/38]
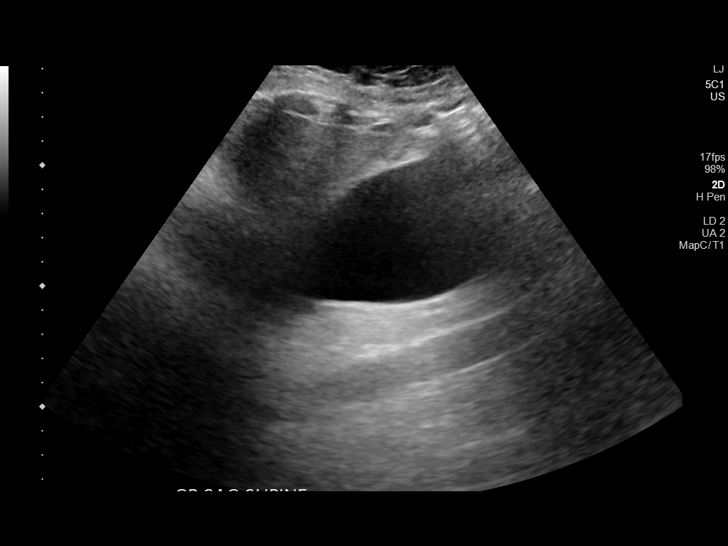
[im 4/38]
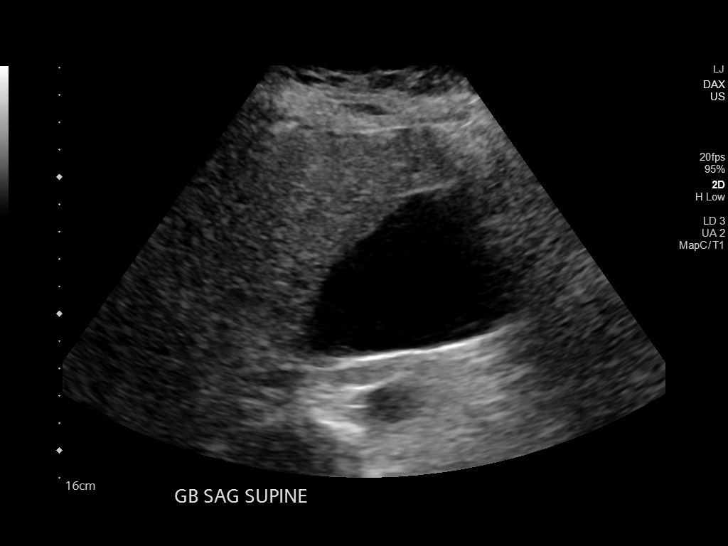
[im 7/38]
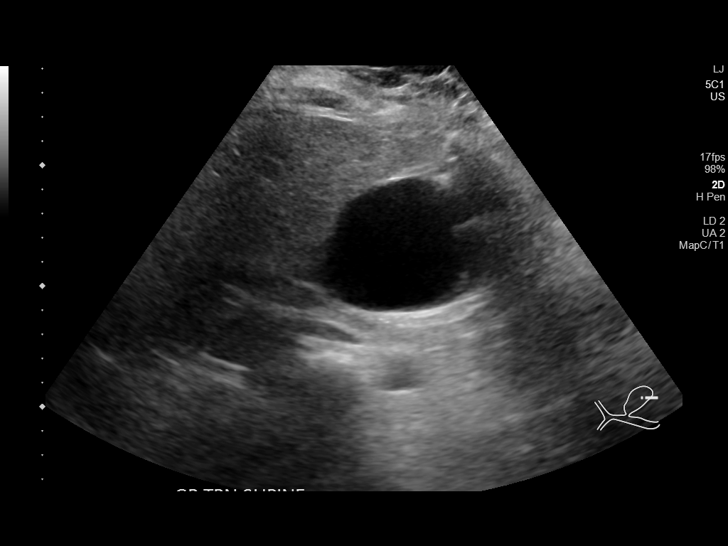
[im 10/38]
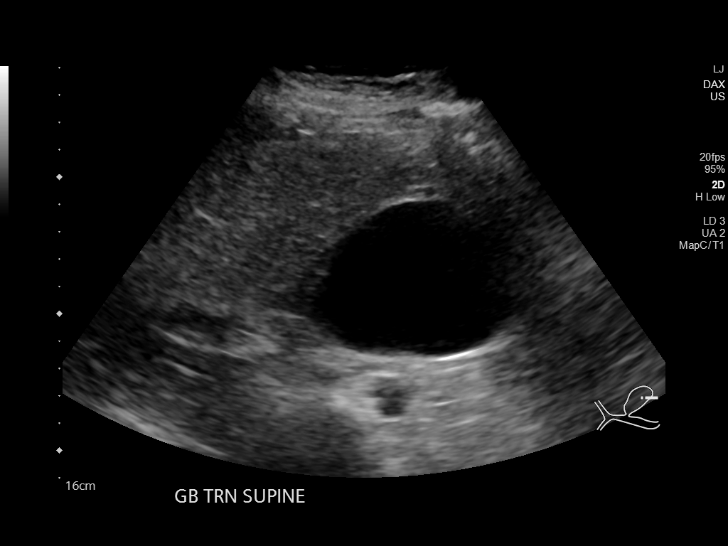
[im 13/38]
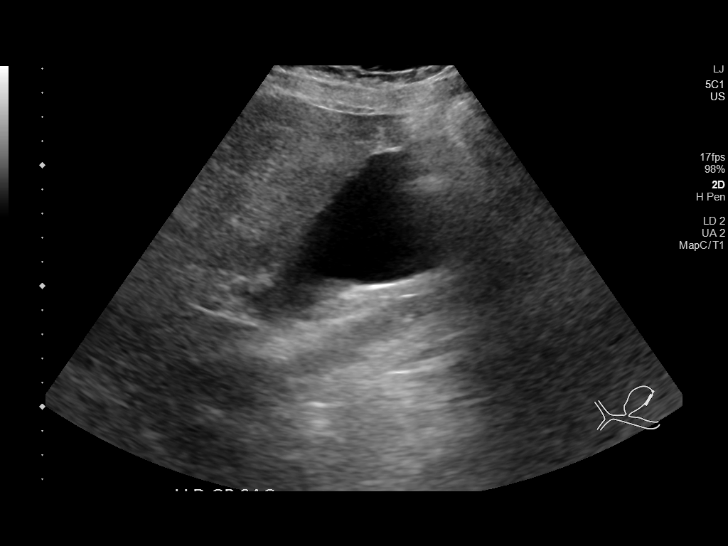
[im 14/38]
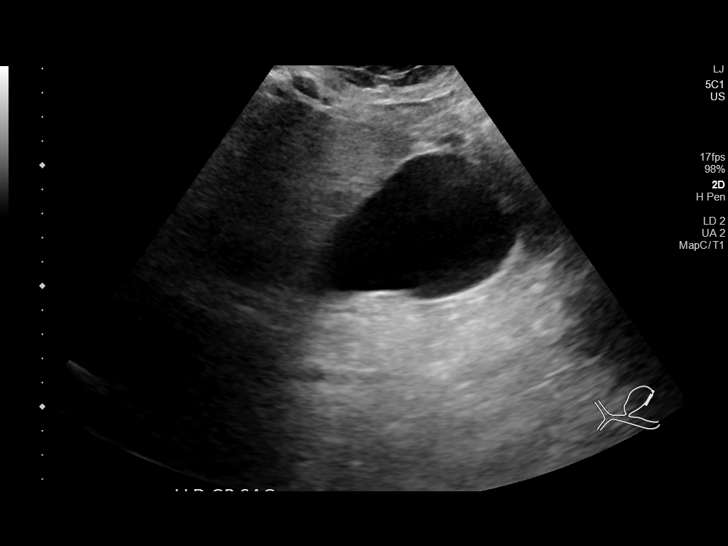
[im 17/38]
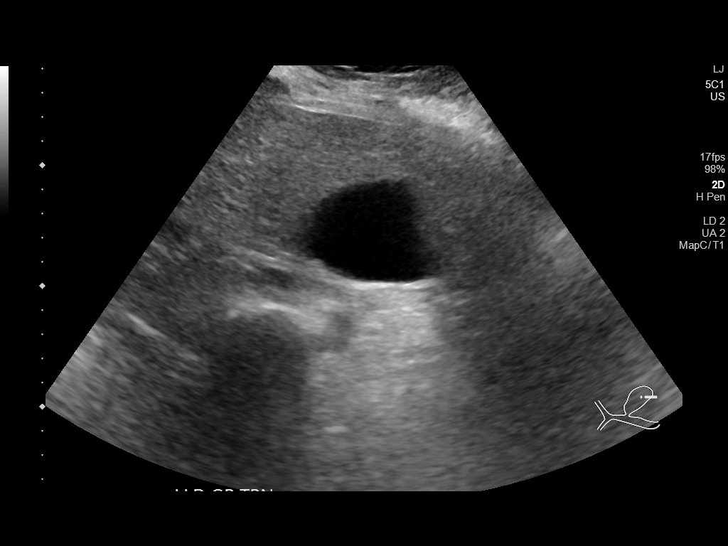
[im 21/38]
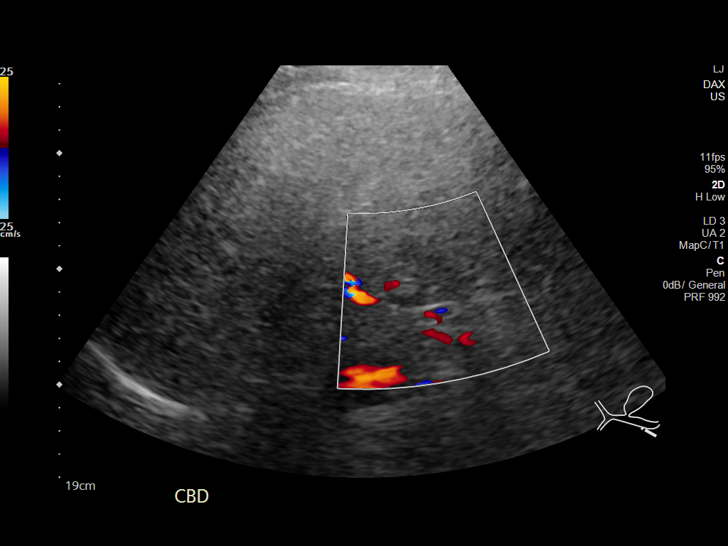
[im 24/38]
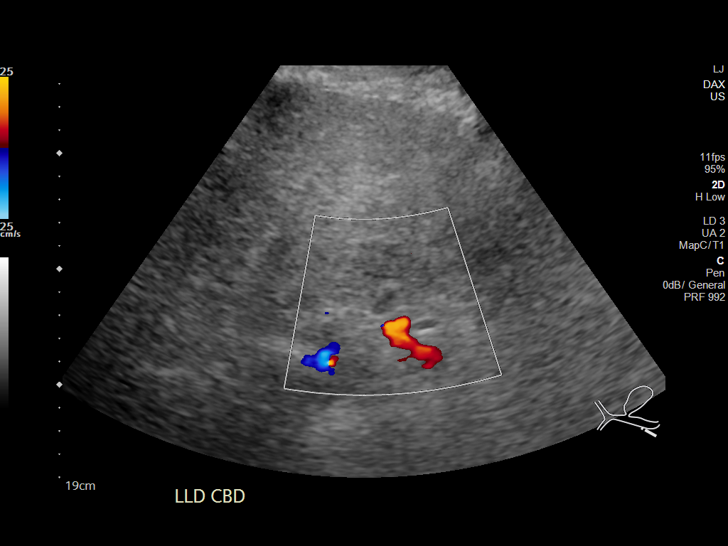
[im 25/38]
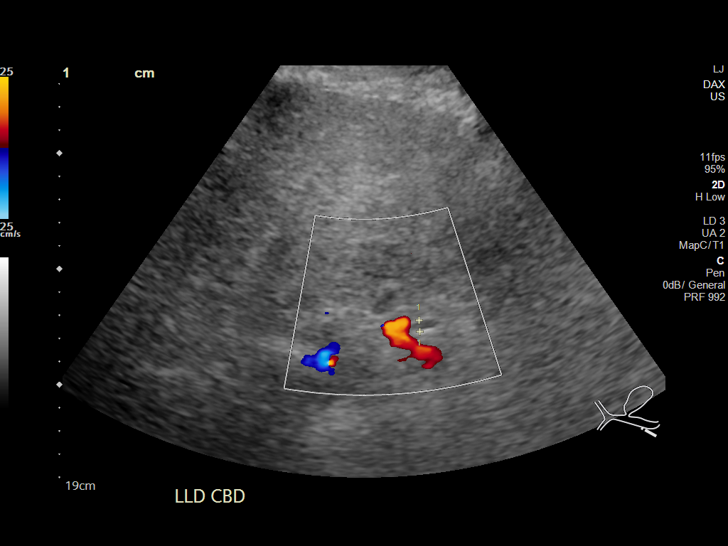
[im 28/38]
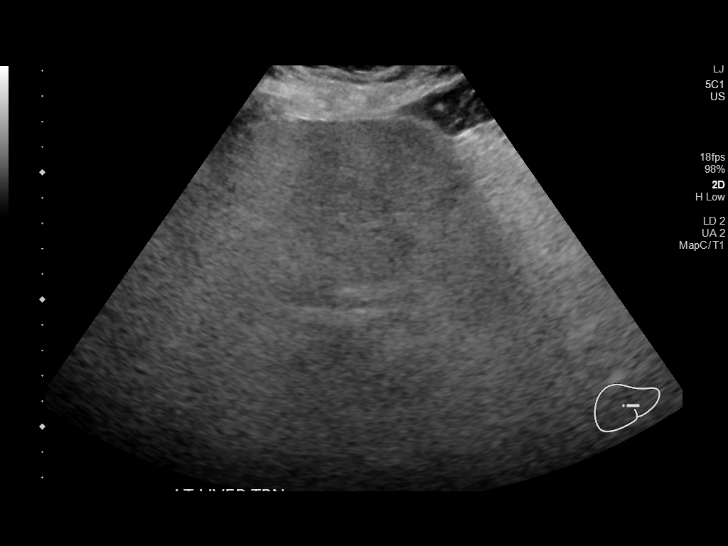
[im 31/38]
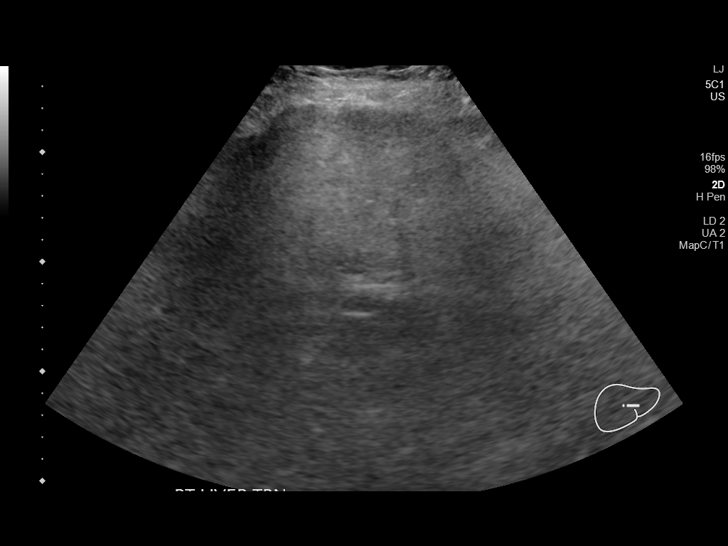
[im 34/38]
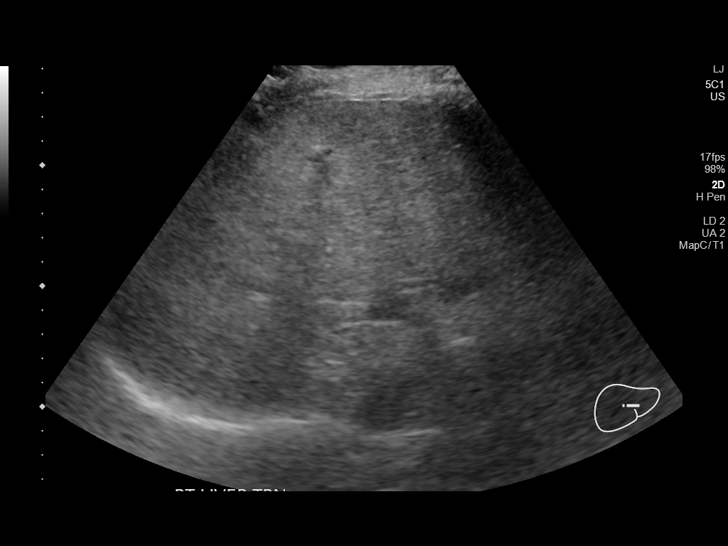
[im 38/38]
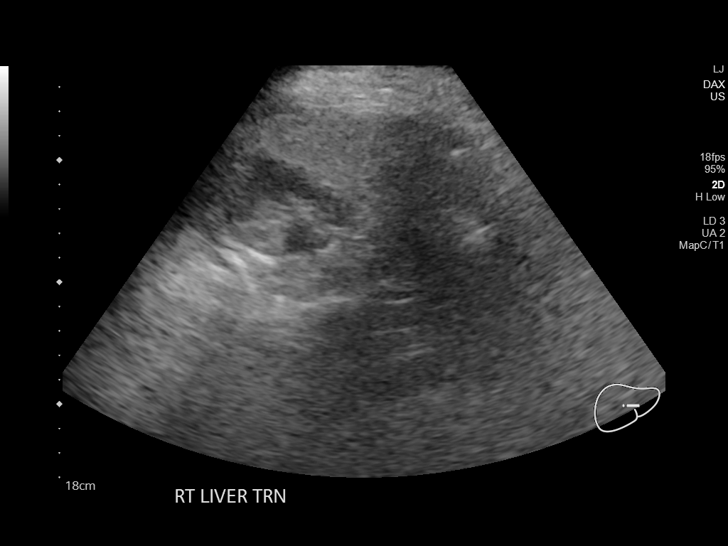

[14 of 25 positions shown; findings below may reference images not displayed]

FINDINGS: Gallbladder:

The gallbladder is distended to 5.4 cm. No gallstones or wall
thickening visualized. No sonographic Murphy sign noted by
sonographer.

Common bile duct:

Diameter: 5 mm

Liver:

No focal lesion identified. Increased coarse parenchymal
echogenicity. Portal vein is patent on color Doppler imaging with
normal direction of blood flow towards the liver.

Other: None.
IMPRESSION: No evidence of acute cholecystitis.

Unchanged gallbladder hydrops.

Increased coarse echotexture of the liver, usually associated with
hepatic steatosis or fibrosis.

## 2020-11-15 MED ORDER — POTASSIUM CHLORIDE IN NACL 40-0.9 MEQ/L-% IV SOLN
INTRAVENOUS | Status: DC
  Administered 2020-11-18: 75 mL/h via INTRAVENOUS
  Filled 2020-11-15 (×12): qty 1000

## 2020-11-15 MED ORDER — AMLODIPINE BESYLATE 5 MG PO TABS
5.0000 mg | ORAL_TABLET | Freq: Every day | ORAL | Status: DC
  Administered 2020-11-15: 5 mg via ORAL
  Filled 2020-11-15: qty 1

## 2020-11-15 MED ORDER — POTASSIUM CHLORIDE CRYS ER 20 MEQ PO TBCR
40.0000 meq | EXTENDED_RELEASE_TABLET | ORAL | Status: AC
  Administered 2020-11-15 (×2): 40 meq via ORAL
  Filled 2020-11-15 (×2): qty 2

## 2020-11-15 MED ORDER — MORPHINE SULFATE (PF) 2 MG/ML IV SOLN
1.0000 mg | INTRAVENOUS | Status: DC | PRN
  Administered 2020-11-15 (×4): 1 mg via INTRAVENOUS
  Filled 2020-11-15 (×4): qty 1

## 2020-11-15 MED ORDER — MORPHINE SULFATE (PF) 2 MG/ML IV SOLN
2.0000 mg | INTRAVENOUS | Status: DC | PRN
  Administered 2020-11-15 – 2020-11-20 (×19): 2 mg via INTRAVENOUS
  Filled 2020-11-15 (×20): qty 1

## 2020-11-15 MED ORDER — LACTULOSE 10 GM/15ML PO SOLN
20.0000 g | Freq: Three times a day (TID) | ORAL | Status: DC
  Administered 2020-11-15 – 2020-11-18 (×11): 20 g via ORAL
  Filled 2020-11-15 (×11): qty 30

## 2020-11-15 MED ORDER — CALCIUM GLUCONATE-NACL 2-0.675 GM/100ML-% IV SOLN
2.0000 g | Freq: Once | INTRAVENOUS | Status: AC
  Administered 2020-11-15: 2000 mg via INTRAVENOUS
  Filled 2020-11-15: qty 100

## 2020-11-15 MED ORDER — LEVOTHYROXINE SODIUM 25 MCG PO TABS
25.0000 ug | ORAL_TABLET | Freq: Every day | ORAL | Status: DC
  Administered 2020-11-15 – 2020-11-16 (×2): 25 ug via ORAL
  Filled 2020-11-15 (×2): qty 1

## 2020-11-15 MED ORDER — MAGNESIUM SULFATE 2 GM/50ML IV SOLN
2.0000 g | Freq: Once | INTRAVENOUS | Status: AC
  Administered 2020-11-15: 2 g via INTRAVENOUS
  Filled 2020-11-15: qty 50

## 2020-11-15 MED ORDER — VENLAFAXINE HCL ER 75 MG PO CP24: 75.0000 mg | ORAL_CAPSULE | Freq: Every day | ORAL | Status: DC

## 2020-11-15 NOTE — Consult Note (Addendum)
[facility] Consult  Referring Provider: [facility] hospital[facility] Primary Care Physician:  Pcp, No Primary Gastroenterologist: Dr. Shona Simpson, NP-[facility]  Reason for Consultation: Nausea, vomiting, right upper quadrant abdominal pain  HPI: Judy Meyer is a 60 y.o. 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 Patient was seen in the hospital in December when she presented with elevated liver enzymes and had a detailed work-up(liver biopsy in 12/21, negative viral serologies for hepatitis A, hepatitis B, hepatitis C, HSV, EBV, HIV, negative autoimmune markers for ANA, AMA, ASMA, minimally low ceruloplasmin), recommended to continue on prednisone and ursodiol.  Patient reports due to constant nausea and multiple episodes of vomiting several times a day, she is only able to drink water or Gatorade and eat minimal amount of solid food.  Surprisingly patient denies any unintentional weight loss. She has noted constipation with a bowel movement every 2 to 3 days, very small in amount but without any blood in stool or black stools. Patient denies IV drug use, high risk sexual behavior, blood transfusions, tattoos or high risk behavior. As we do not know the etiology of her liver disease she was advised complete abstinence from alcohol use.  However she reports drinking half a shot glass of vodka few times a week to few times a month(she kept changing the frequency during history taking), reportedly to make the nausea, vomiting and right upper quadrant abdominal pain better. For the last 6 weeks patient has not been on prednisone or ursodiol as recommended. She denies episodes of confusion however states that her  boyfriend feels that she has significant memory loss.  She denies difficulty swallowing, pain on swallowing, early satiety, bloating, rectal pain. Patient is unsure if she has ever had an endoscopy or a colonoscopy in the past. Denies family history of liver disease or liver cancer.   Past Medical History:  Diagnosis Date  . Hypertension   . Hypothyroidism   . Liver disease   . Uterine fibroid     Past Surgical History:  Procedure Laterality Date  . hand fracture surgery Right   . LIVER BIOPSY      Prior to Admission medications   Medication Sig Start Date End Date Taking? Authorizing Provider  albuterol (VENTOLIN HFA) 108 (90 Base) MCG/ACT inhaler Inhale 2 puffs into the lungs 4 (four) times daily as needed for wheezing or shortness of breath. 12/20/19  Yes [provider]  amLODipine (NORVASC) 5 MG tablet Take 5 mg by mouth daily.   Yes [provider]  Galcanezumab-gnlm 120 MG/ML SOAJ Inject 120 mLs into the skin every 30 (thirty) days.   Yes [provider]  levothyroxine (SYNTHROID) 25 MCG tablet Take 25 mcg by mouth every morning. 08/19/20  Yes [provider]  pantoprazole (PROTONIX) 40 MG tablet Take 1 tablet by mouth daily. 11/07/20  Yes [provider]  venlafaxine XR (EFFEXOR-XR) 75 MG 24 hr capsule Take 75 mg by mouth daily.   Yes [provider]  amLODipine (NORVASC) 5 MG tablet Take 1 tablet (5 mg total) by mouth daily. 08/06/20 09/05/20  Bonnielee Haff, MD  famotidine (PEPCID) 20 MG tablet Take 1 tablet (20 mg total) by mouth daily. Patient not taking: Reported on 11/14/2020 08/07/20   Bonnielee Haff, MD  folic acid (FOLVITE) 1  MG tablet Take 1 tablet (1 mg total) by mouth daily. Patient not taking: Reported on 11/14/2020 08/07/20   Bonnielee Haff, MD  oxyCODONE (OXY IR/ROXICODONE) 5 MG immediate release tablet Take 1-2 tablets (5-10 mg total) by mouth every 6 (six) hours as needed for severe pain. Patient not taking:  Reported on 11/14/2020 08/06/20   Bonnielee Haff, MD  polyethylene glycol (MIRALAX / GLYCOLAX) 17 g packet Take 17 g by mouth daily. Patient not taking: Reported on 11/14/2020 08/07/20   Bonnielee Haff, MD  prednisoLONE 5 MG TABS tablet Take 8 tablets (40 mg total) by mouth daily. Patient not taking: Reported on 11/14/2020 08/07/20   Bonnielee Haff, MD  thiamine 100 MG tablet Take 1 tablet (100 mg total) by mouth daily. Patient not taking: No sig reported 08/07/20   Bonnielee Haff, MD  ursodiol (ACTIGALL) 300 MG capsule Take 2 capsules (600 mg total) by mouth 2 (two) times daily. Patient not taking: Reported on 11/14/2020 08/06/20   Bonnielee Haff, MD    Current Facility-Administered Medications  Medication Dose Route Frequency Provider Last Rate Last Admin  . 0.9 %  sodium chloride infusion   Intravenous PRN Tu, Ching T, DO   Stopped at 11/15/20 0300  . 0.9 % NaCl with KCl 40 mEq / L  infusion   Intravenous Continuous Gonfa, Taye T, MD      . amLODipine (NORVASC) tablet 5 mg  5 mg Oral Daily Tu, Ching T, DO      . calcium gluconate 2 g/ 100 mL sodium chloride IVPB  2 g Intravenous Once Gonfa, Taye T, MD      . erythromycin ophthalmic ointment   Both Eyes Q6H Tu, Ching T, DO   1 application at 19/50/93 0454  . feeding supplement (BOOST / RESOURCE BREEZE) liquid 1 Container  1 Container Oral TID BM Tu, Ching T, DO      . levothyroxine (SYNTHROID) tablet 25 mcg  25 mcg Oral Q0600 Tu, Ching T, DO   25 mcg at 11/15/20 0438  . LORazepam (ATIVAN) injection 0.5 mg  0.5 mg Intravenous Q6H PRN Tu, Ching T, DO   0.5 mg at 11/14/20 2241  . morphine 2 MG/ML injection 1 mg  1 mg Intravenous Q3H PRN Tu, Ching T, DO   1 mg at 11/15/20 0744  . potassium chloride SA (KLOR-CON) CR tablet 40 mEq  40 mEq Oral Q3H Mercy Riding, MD        Allergies as of 11/14/2020 - Review Complete 11/14/2020  Allergen Reaction Noted  . Compazine [prochlorperazine edisylate] Anaphylaxis 07/11/2018  . Periactin [cyproheptadine]  Anaphylaxis 07/11/2018  . Cyclobenzaprine  07/11/2018  . Haloperidol and related  07/11/2018  . Metoclopramide Other (See Comments) 07/11/2018  . Other Other (See Comments) 05/03/2020  . Prochlorperazine Other (See Comments) 05/03/2020    Family History  Problem Relation Age of Onset  . Hypertension Other   . Hypertension Mother   . High Cholesterol Mother     Social History   Socioeconomic History  . Marital status: Single    Spouse name: Not on file  . Number of children: Not on file  . Years of education: Not on file  . Highest education level: Not on file  Occupational History  . Not on file  Tobacco Use  . Smoking status: Never Smoker  . Smokeless tobacco: Never Used  Vaping Use  . Vaping Use: Never used  Substance and Sexual Activity  . Alcohol use: Not Currently  .  Drug use: Never  . Sexual activity: Yes  Other Topics Concern  . Not on file  Social History Narrative  . Not on file   Social Determinants of Health   Financial Resource Strain: Not on file  Food Insecurity: Not on file  Transportation Needs: Not on file  Physical Activity: Not on file  Stress: Not on file  Social Connections: Not on file  Intimate Partner Violence: Not on file    Review of Systems: Positive for: GI: Described in detail in HPI.    Gen: anorexia, fatigue, weakness, malaise, denies any fever, chills, rigors, night sweats,  involuntary weight loss, and sleep disorder CV: Denies chest pain, angina, palpitations, syncope, orthopnea, PND, peripheral edema, and claudication. Resp: Denies dyspnea, cough, sputum, wheezing, coughing up blood. GU : Denies urinary burning, blood in urine, urinary frequency, urinary hesitancy, nocturnal urination, and urinary incontinence. MS: Denies joint pain or swelling.  Denies muscle weakness, cramps, atrophy.  Derm: Denies rash, itching, oral ulcerations, hives, unhealing ulcers.  Psych: Denies depression, anxiety, memory loss, suicidal ideation,  hallucinations,  and confusion. Heme: Denies bruising, bleeding, and enlarged lymph nodes. Neuro:  Denies any headaches, dizziness, paresthesias. Endo:  Denies any problems with DM, thyroid, adrenal function.  Physical Exam: Vital signs in last 24 hours: Temp:  [97.6 F (36.4 C)-98.8 F (37.1 C)] 98.8 F (37.1 C) (04/09 0456) Pulse Rate:  [66-95] 81 (04/09 0456) Resp:  [15-23] 18 (04/09 0456) BP: (124-160)/(74-98) 124/74 (04/09 0456) SpO2:  [96 %-100 %] 98 % (04/09 0456) Weight:  [68 kg-73.2 kg] 73.2 kg (04/08 2250) Last BM Date: 11/11/20  General:   Alert,  Well-developed, well-nourished, pleasant and cooperative in NAD Head:  Normocephalic and atraumatic. Eyes: Icteric, mild pallor Ears:  Normal auditory acuity. Nose:  No deformity, discharge,  or lesions. Mouth: Dry mucous membranes Neck:  Supple; no masses or thyromegaly. Lungs:  Clear throughout to auscultation.   No wheezes, crackles, or rhonchi. No acute distress. Heart:  Regular rate and rhythm; no murmurs, clicks, rubs,  or gallops. Extremities:  Without clubbing or edema. Neurologic:  Alert and  oriented x4;  grossly normal neurologically.  No asterixis  skin:  Intact without significant lesions or rashes. Psych:  Alert and cooperative. Normal mood and affect. Abdomen: Soft, right upper quadrant and epigastric tenderness, normoactive bowel sounds.  No obvious rigidity, guarding or rebound tenderness.  No enlarged masses identified.   Lab Results: Recent Labs    11/14/20 1525 11/15/20 0541  WBC 7.9 6.2  HGB 13.4 12.2  HCT 41.0 37.5  PLT 171 133*   BMET Recent Labs    11/14/20 1525 11/15/20 0541  NA 132* 134*  K 2.2* 2.3*  CL 93* 101  CO2 18* 19*  GLUCOSE 95 109*  BUN 6 <5*  CREATININE 0.88 0.77  CALCIUM 8.1* 7.4*   LFT Recent Labs    11/15/20 0541  PROT 6.4*  ALBUMIN 3.0*  AST 131*  ALT 19  ALKPHOS 93  BILITOT 8.0*  8.1*  BILIDIR 4.7*  IBILI 3.3*   PT/INR Recent Labs    11/14/20 1525   LABPROT 14.9  INR 1.2    Studies/Results: CT Abdomen Pelvis W Contrast  Result Date: 11/14/2020 CLINICAL DATA:  Nausea vomiting epigastric EXAM: CT ABDOMEN AND PELVIS WITH CONTRAST TECHNIQUE: Multidetector CT imaging of the abdomen and pelvis was performed using the standard protocol following bolus administration of intravenous contrast. CONTRAST:  146mL OMNIPAQUE IOHEXOL 300 MG/ML  SOLN COMPARISON:  None. FINDINGS: Lower  chest: The visualized heart size within normal limits. No pericardial fluid/thickening. No hiatal hernia. The visualized portions of the lungs are clear. Hepatobiliary: There is diffuse low density seen throughout the liver parenchyma with mild hepatomegaly.The main portal vein is patent. No evidence of calcified gallstones, gallbladder wall thickening or biliary dilatation. Fluid-filled dilated gallbladder is again noted. Pancreas: Unremarkable. No pancreatic ductal dilatation or surrounding inflammatory changes. Spleen: Normal in size without focal abnormality. Adrenals/Urinary Tract: Both adrenal glands appear normal. There is a 3 cm low-density lesion seen in the upper pole of the right kidney. A punctate 4 mm calculus seen in lower pole of the right kidney. There is a punctate calcifications seen in the upper pole of the left kidney. The bladder is unremarkable. No hydronephrosis. Stomach/Bowel: There is apparent mild wall thickening seen within the mid body of the stomach. However no surrounding inflammatory changes are seen. The small bowel, and colon are normal in appearance. No inflammatory changes, wall thickening, or obstructive findings. Vascular/Lymphatic: There are no enlarged mesenteric, retroperitoneal, or pelvic lymph nodes. No significant vascular findings are present. Reproductive: Again noted is a fibroid uterus some with partial internal calcifications noted. Other: No evidence of abdominal wall mass or hernia. Musculoskeletal: No acute or significant osseous findings.  IMPRESSION: Hepatic steatosis and mild hepatomegaly Unchanged gallbladder hydrops Apparent wall thickening of the mid body of stomach which may be due to physiologic under distension versus mild gastritis Unchanged fibroid uterus Electronically Signed   By: Prudencio Pair M.D.   On: 11/14/2020 18:23    Impression: Nausea, vomiting and right upper quadrant abdominal pain-CAT scan shows fluid-filled dilated gallbladder/unchanged gallbladder hydrops  Hepatic steatosis-T bili 8/AST 131/ALT 19/ALP 93(direct bili 4.7, indirect 3.3) No encephalopathy noted No coagulopathy, PT 14.9, INR 1.2  History of intermittent alcohol use  Hypokalemia, potassium 2.3  Hypothyroidism, TSH significantly elevated at 42.829(patient reports not taking any medications for the last 6 weeks because of ongoing nausea and vomiting)   Plan: Recommend surgical evaluation for distended gallbladder, continued nausea, vomiting and right upper quadrant abdominal pain for 6 weeks  I believe patient is not very forthcoming with amount of alcohol intake, although her liver enzymes have improved, AST is still more than ALT suspicious for alcoholic hepatitis. Fortunately she has no coagulopathy. We will start patient on lactulose.  Patient is requesting for increase in morphine, to be noted she is on 1 mg every 3 hours.  She has been resumed on levothyroxine.  Receiving appropriate potassium supplementation.    LOS: 1 day   Ronnette Juniper, MD  11/15/2020, 9:36 AM

## 2020-11-15 NOTE — Progress Notes (Signed)
ECG performed.  "Normal sinus rhythm Nonspecific ST and T ware abnormality Abnormal ECG" print out placed on chart.

## 2020-11-15 NOTE — Progress Notes (Signed)
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  Patient had extensive work-up for acute hepatitis in 07/2020.  She was seen by [facility] a pathologist outpatient, and completed steroid course.   In ED, hemodynamically stable.  K2.2.  AST 147.  Total bili 9.4 (lower than prior).  Ammonia 125.  CK 537.   Subjective: Seen and examined earlier this morning.  No major events overnight of this morning.  She says she feels better but reports 8/10 RUQ pain.  Describes the pain as sharp radiating to her back on the right.  Reports nausea but no emesis.  Reports small loose bowel movements.  Objective: Vitals:   11/14/20 2250 11/15/20 0125 11/15/20 0456 11/15/20 1350  BP: (!) 159/80 (!) 154/79 124/74 (!) 141/79  Pulse: 76 80 81 81  Resp: 18 17 18 17   Temp: 97.6 F (36.4 C) 98.5 F (36.9 C) 98.8 F (37.1 C) 97.8 F (36.6 C)  TempSrc:      SpO2: 100% 97% 98% 98%  Weight: 73.2 kg     Height: 5\' 6"  (1.676 m)       Intake/Output Summary (Last 24 hours) at 11/15/2020 1404 Last data filed at 11/15/2020 0600 Gross per 24 hour  Intake 2763.01 ml  Output --  Net 2763.01 ml   Filed Weights   11/14/20 1440 11/14/20 2250  Weight: 68 kg 73.2 kg    Examination:  GENERAL: No apparent distress.  Nontoxic. HEENT: MMM.  Sclerae icteric. NECK: Supple.  No apparent JVD.  RESP:  No IWOB.  Fair aeration bilaterally. CVS:  RRR. Heart sounds normal.  ABD/GI/GU: BS+. Abd soft.  RUQ  tenderness. MSK/EXT:  Moves extremities. No apparent deformity. No edema.  SKIN: Jaundice in her face and neck NEURO: Awake, alert and oriented appropriately.  No apparent focal neuro deficit. PSYCH: Calm. Normal affect.   Procedures:  None  Microbiology summarized: [id number] and influenza PCR nonreactive.  Assessment & Plan: Intactable nausea and vomiting-due to cholestasis?  CT abdomen and pelvis with hepatic steatosis, mild hepatomegaly, unchanged gallbladder hydrops on wall thickening of mid stomach.   -RUQ ultrasound +/-HIDA scan -Continue antiemetics and pain medications with IV fluid  Acute hepatic encephalopathy: had some forgetfulness on presentation.  Ammonia 125>> 87.  Improving. -Lactulose 20 g 3 times daily -Continue monitoring  Subacute hepatitis/cholestasis-had extensive but unrevealing work-up recently. Elevated AST-could be due to rhabdo alcohol. -GI consulted-appreciate input. -Continue home Ursodil  Hypokalemia/hypomagnesemia/hyponatremia/hypocalcemia/metabolic acidosis: W2.9.  Mg 1.7.  Likely due to GI loss -Continue to 40 mEq x 3 in addition to what she is getting IV fluid -IV magnesium sulfate 2 g x 1 -IV calcium gluconate 2 g x 1  Prolonged QT: Improved.  QTc 450.  Likely due to electrolyte arrangement -Minimize/avoid QT prolonging drugs -Optimize electrolytes  Hyperbilirubinemia: Likely due to underlying cholestasis.  Improving. -Continue monitoring  Elevated AST: Likely due to rhabdo.  Also reports occasional alcohol but "not more than half cup of vodka" -Encouraged to avoid alcohol altogether given underlying liver condition.  Mild nontraumatic rhabdomyolysis: CK 428.  Improved. -Continue monitoring -  Continue IV fluids  Conjunctivitis: Does not look bacteria on my exam.  -Continue erythromycin ointment q6hrs  Essential hypertension -Continue amlodipine  Hypothyroidism -Check TSH -Continue levothyroxine  Depression -Resume home  medications   Body mass index is 26.05 kg/m.         DVT prophylaxis:  SCDs Start: 11/14/20 2136  Code Status: Full code Family Communication: Patient and/or RN. Available if any question.  Level of care: Med-Surg Status is: Inpatient  Remains inpatient appropriate because:Persistent severe electrolyte disturbances, Ongoing diagnostic testing needed not appropriate for outpatient work up, IV treatments appropriate due to intensity of illness or inability to take PO and Inpatient level of care appropriate due to severity of illness   Dispo: The patient is from: Home              Anticipated d/c is to: Home              Patient currently is not medically stable to d/c.   Difficult to place patient No       Consultants:  Gastroenterology    Sch Meds:  Scheduled Meds: . amLODipine  5 mg Oral Daily  . erythromycin   Both Eyes Q6H  . feeding supplement  1 Container Oral TID BM  . lactulose  20 g Oral TID  . levothyroxine  25 mcg Oral Q0600  . potassium chloride  40 mEq Oral Q3H   Continuous Infusions: . sodium chloride Stopped (11/15/20 0300)  . 0.9 % NaCl with KCl 40 mEq / L 100 mL/hr at 11/15/20 0945   PRN Meds:.sodium chloride, LORazepam, morphine injection  Antimicrobials: Anti-infectives (From admission, onward)   None       I have personally reviewed the following labs and images: CBC: Recent Labs  Lab 11/14/20 1525 11/15/20 0541  WBC 7.9 6.2  HGB 13.4 12.2  HCT 41.0 37.5  MCV 94.9 95.7  PLT 171 133*   BMP &GFR Recent Labs  Lab 11/14/20 1525 11/14/20 1626 11/15/20 0541  NA 132*  --  134*  K 2.2*  --  2.3*  CL 93*  --  101  CO2 18*  --  19*  GLUCOSE 95  --  109*  BUN 6  --  <5*  CREATININE 0.88  --  0.77  CALCIUM 8.1*  --  7.4*  MG  --  1.3* 1.7   Estimated Creatinine Clearance: 78.5 mL/min (by C-G formula based on SCr of 0.77 mg/dL). Liver & Pancreas: Recent Labs  Lab 11/14/20 1525 11/15/20 0541  AST 147* 131*  ALT 22 19   ALKPHOS 108 93  BILITOT 9.4* 8.0*  8.1*  PROT 7.4 6.4*  ALBUMIN 3.5 3.0*   Recent Labs  Lab 11/14/20 1525  LIPASE 99*   Recent Labs  Lab 11/14/20 1626 11/15/20 0541  AMMONIA 125* 87*   Diabetic: No results for input(s): HGBA1C in the last 72 hours. Recent Labs  Lab 11/14/20 1527  GLUCAP 86   Cardiac Enzymes: Recent Labs  Lab 11/14/20 1525 11/15/20 0541  CKTOTAL 537* 428*   No results for input(s): PROBNP in the last 8760 hours. Coagulation Profile: Recent Labs  Lab 11/14/20 1525  INR 1.2   Thyroid Function Tests: Recent Labs    11/15/20 0831  TSH 42.829*   Lipid Profile: No results for input(s): CHOL, HDL, LDLCALC, TRIG, CHOLHDL, LDLDIRECT in the last 72 hours. Anemia Panel: No results for input(s): VITAMINB12, FOLATE, FERRITIN, TIBC, IRON, RETICCTPCT in the last 72 hours. Urine analysis:  Component Value Date/Time   COLORURINE AMBER (A) 11/14/2020 2040   APPEARANCEUR CLEAR 11/14/2020 2040   LABSPEC 1.027 11/14/2020 2040   PHURINE 8.0 11/14/2020 2040   GLUCOSEU NEGATIVE 11/14/2020 2040   HGBUR SMALL (A) 11/14/2020 2040   BILIRUBINUR NEGATIVE 11/14/2020 2040   KETONESUR NEGATIVE 11/14/2020 2040   PROTEINUR NEGATIVE 11/14/2020 2040   NITRITE NEGATIVE 11/14/2020 2040   LEUKOCYTESUR NEGATIVE 11/14/2020 2040   Sepsis Labs: Invalid input(s): PROCALCITONIN, [place]  Microbiology: Recent Results (from the past 240 hour(s))  Resp Panel by RT-PCR (Flu A&B, Covid) Nasopharyngeal Swab     Status: None   Collection Time: 11/14/20  6:41 PM   Specimen: Nasopharyngeal Swab; Nasopharyngeal(NP) swabs in vial transport medium  Result Value Ref Range Status   SARS Coronavirus 2 by RT PCR NEGATIVE NEGATIVE Final    Comment: (NOTE) SARS-CoV-2 target nucleic acids are NOT DETECTED.  The SARS-CoV-2 RNA is generally detectable in upper respiratory specimens during the acute phase of infection. The lowest concentration of SARS-CoV-2 viral copies this assay  can detect is 138 copies/mL. A negative result does not preclude SARS-Cov-2 infection and should not be used as the sole basis for treatment or other patient management decisions. A negative result may occur with  improper specimen collection/handling, submission of specimen other than nasopharyngeal swab, presence of viral mutation(s) within the areas targeted by this assay, and inadequate number of viral copies(<138 copies/mL). A negative result must be combined with clinical observations, patient history, and epidemiological information. The expected result is Negative.  Fact Sheet for Patients:  EntrepreneurPulse.com.au  Fact Sheet for Healthcare Providers:  IncredibleEmployment.be  This test is no t yet approved or cleared by the [place] FDA and  has been authorized for detection and/or diagnosis of SARS-CoV-2 by FDA under an Emergency Use Authorization (EUA). This EUA will remain  in effect (meaning this test can be used) for the duration of the COVID-19 declaration under Section 564(b)(1) of the Act, 21 U.S.C.section 360bbb-3(b)(1), unless the authorization is terminated  or revoked sooner.       Influenza A by PCR NEGATIVE NEGATIVE Final   Influenza B by PCR NEGATIVE NEGATIVE Final    Comment: (NOTE) The Xpert Xpress SARS-CoV-2/FLU/RSV plus assay is intended as an aid in the diagnosis of influenza from Nasopharyngeal swab specimens and should not be used as a sole basis for treatment. Nasal washings and aspirates are unacceptable for Xpert Xpress SARS-CoV-2/FLU/RSV testing.  Fact Sheet for Patients: EntrepreneurPulse.com.au  Fact Sheet for Healthcare Providers: IncredibleEmployment.be  This test is not yet approved or cleared by the [place] FDA and has been authorized for detection and/or diagnosis of SARS-CoV-2 by FDA under an Emergency Use Authorization (EUA). This EUA will  remain in effect (meaning this test can be used) for the duration of the COVID-19 declaration under Section 564(b)(1) of the Act, 21 U.S.C. section 360bbb-3(b)(1), unless the authorization is terminated or revoked.  Performed at [facility], [place] [address], [place], [place] [address]     Radiology Studies: CT Abdomen Pelvis W Contrast  Result Date: 11/14/2020 CLINICAL DATA:  Nausea vomiting epigastric EXAM: CT ABDOMEN AND PELVIS WITH CONTRAST TECHNIQUE: Multidetector CT imaging of the abdomen and pelvis was performed using the standard protocol following bolus administration of intravenous contrast. CONTRAST:  157mL OMNIPAQUE IOHEXOL 300 MG/ML  SOLN COMPARISON:  None. FINDINGS: Lower chest: The visualized heart size within normal limits. No pericardial fluid/thickening. No hiatal hernia. The visualized portions of the lungs are clear. Hepatobiliary:  There is diffuse low density seen throughout the liver parenchyma with mild hepatomegaly.The main portal vein is patent. No evidence of calcified gallstones, gallbladder wall thickening or biliary dilatation. Fluid-filled dilated gallbladder is again noted. Pancreas: Unremarkable. No pancreatic ductal dilatation or surrounding inflammatory changes. Spleen: Normal in size without focal abnormality. Adrenals/Urinary Tract: Both adrenal glands appear normal. There is a 3 cm low-density lesion seen in the upper pole of the right kidney. A punctate 4 mm calculus seen in lower pole of the right kidney. There is a punctate calcifications seen in the upper pole of the left kidney. The bladder is unremarkable. No hydronephrosis. Stomach/Bowel: There is apparent mild wall thickening seen within the mid body of the stomach. However no surrounding inflammatory changes are seen. The small bowel, and colon are normal in appearance. No inflammatory changes, wall thickening, or obstructive findings. Vascular/Lymphatic: There are no enlarged mesenteric,  retroperitoneal, or pelvic lymph nodes. No significant vascular findings are present. Reproductive: Again noted is a fibroid uterus some with partial internal calcifications noted. Other: No evidence of abdominal wall mass or hernia. Musculoskeletal: No acute or significant osseous findings. IMPRESSION: Hepatic steatosis and mild hepatomegaly Unchanged gallbladder hydrops Apparent wall thickening of the mid body of stomach which may be due to physiologic under distension versus mild gastritis Unchanged fibroid uterus Electronically Signed   By: Prudencio Pair M.D.   On: 11/14/2020 18:23     Anastacia Reinecke T. Luce  If 7PM-7AM, please contact night-coverage [email or web address] 11/15/2020, 2:04 PM

## 2020-11-16 DIAGNOSIS — Z9119 Patient's noncompliance with other medical treatment and regimen: Secondary | ICD-10-CM

## 2020-11-16 DIAGNOSIS — D689 Coagulation defect, unspecified: Secondary | ICD-10-CM

## 2020-11-16 DIAGNOSIS — L299 Pruritus, unspecified: Secondary | ICD-10-CM

## 2020-11-16 LAB — CBC
HCT: 36.7 % (ref 36.0–46.0)
Hemoglobin: 11.6 g/dL — ABNORMAL LOW (ref 12.0–15.0)
MCH: 31.4 pg (ref 26.0–34.0)
MCHC: 31.6 g/dL (ref 30.0–36.0)
MCV: 99.2 fL (ref 80.0–100.0)
Platelets: 146 10*3/uL — ABNORMAL LOW (ref 150–400)
RBC: 3.7 MIL/uL — ABNORMAL LOW (ref 3.87–5.11)
RDW: 21.3 % — ABNORMAL HIGH (ref 11.5–15.5)
WBC: 7.6 10*3/uL (ref 4.0–10.5)
nRBC: 0 % (ref 0.0–0.2)

## 2020-11-16 LAB — COMPREHENSIVE METABOLIC PANEL
ALT: 19 U/L (ref 0–44)
AST: 118 U/L — ABNORMAL HIGH (ref 15–41)
Albumin: 3.1 g/dL — ABNORMAL LOW (ref 3.5–5.0)
Alkaline Phosphatase: 99 U/L (ref 38–126)
Anion gap: 16 — ABNORMAL HIGH (ref 5–15)
BUN: <5 mg/dL — ABNORMAL LOW (ref 6–20)
CO2: 16 mmol/L — ABNORMAL LOW (ref 22–32)
Calcium: 8 mg/dL — ABNORMAL LOW (ref 8.9–10.3)
Chloride: 101 mmol/L (ref 98–111)
Creatinine, Ser: 0.47 mg/dL (ref 0.44–1.00)
GFR, Estimated: >60 mL/min (ref >60)
Glucose, Bld: 101 mg/dL — ABNORMAL HIGH (ref 70–99)
Potassium: 4.3 mmol/L (ref 3.5–5.1)
Sodium: 133 mmol/L — ABNORMAL LOW (ref 135–145)
Total Bilirubin: 8.3 mg/dL — ABNORMAL HIGH (ref 0.3–1.2)
Total Protein: 6.3 g/dL — ABNORMAL LOW (ref 6.5–8.1)

## 2020-11-16 LAB — PROTIME-INR
INR: 2.6 — ABNORMAL HIGH (ref 0.8–1.2)
Prothrombin Time: 27.1 seconds — ABNORMAL HIGH (ref 11.4–15.2)

## 2020-11-16 LAB — AMMONIA: Ammonia: 84 umol/L — ABNORMAL HIGH (ref 9–35)

## 2020-11-16 LAB — APTT: aPTT: 26 seconds (ref 24–36)

## 2020-11-16 LAB — CK: Total CK: 456 U/L — ABNORMAL HIGH (ref 38–234)

## 2020-11-16 MED ORDER — ADULT MULTIVITAMIN W/MINERALS CH
1.0000 | ORAL_TABLET | Freq: Every day | ORAL | Status: DC
  Administered 2020-11-16 – 2020-11-20 (×5): 1 via ORAL
  Filled 2020-11-16 (×5): qty 1

## 2020-11-16 MED ORDER — VITAMIN K1 10 MG/ML IJ SOLN
10.0000 mg | Freq: Once | INTRAVENOUS | Status: AC
  Administered 2020-11-16: 10 mg via INTRAVENOUS
  Filled 2020-11-16: qty 1

## 2020-11-16 MED ORDER — PROSOURCE PLUS PO LIQD
30.0000 mL | Freq: Three times a day (TID) | ORAL | Status: DC
  Administered 2020-11-17 – 2020-11-20 (×9): 30 mL via ORAL
  Filled 2020-11-16 (×9): qty 30

## 2020-11-16 MED ORDER — LORAZEPAM 2 MG/ML IJ SOLN
0.5000 mg | Freq: Four times a day (QID) | INTRAMUSCULAR | Status: DC | PRN
  Administered 2020-11-16 – 2020-11-20 (×13): 0.5 mg via INTRAVENOUS
  Filled 2020-11-16 (×13): qty 1

## 2020-11-16 MED ORDER — SODIUM BICARBONATE 650 MG PO TABS
650.0000 mg | ORAL_TABLET | Freq: Three times a day (TID) | ORAL | Status: DC
  Administered 2020-11-16 – 2020-11-20 (×14): 650 mg via ORAL
  Filled 2020-11-16 (×16): qty 1

## 2020-11-16 MED ORDER — CHOLESTYRAMINE 4 G PO PACK
4.0000 g | PACK | ORAL | Status: DC
  Administered 2020-11-16: 4 g via ORAL
  Filled 2020-11-16 (×3): qty 1

## 2020-11-16 MED ORDER — LEVOTHYROXINE SODIUM 25 MCG PO TABS
25.0000 ug | ORAL_TABLET | Freq: Once | ORAL | Status: AC
  Administered 2020-11-16: 25 ug via ORAL
  Filled 2020-11-16: qty 1

## 2020-11-16 MED ORDER — PHYTONADIONE 5 MG PO TABS
5.0000 mg | ORAL_TABLET | Freq: Once | ORAL | Status: DC
  Filled 2020-11-16: qty 1

## 2020-11-16 MED ORDER — LEVOTHYROXINE SODIUM 50 MCG PO TABS
50.0000 ug | ORAL_TABLET | Freq: Every day | ORAL | Status: DC
  Administered 2020-11-17 – 2020-11-20 (×3): 50 ug via ORAL
  Filled 2020-11-16 (×3): qty 1

## 2020-11-16 MED ORDER — AMLODIPINE BESYLATE 10 MG PO TABS
10.0000 mg | ORAL_TABLET | Freq: Every day | ORAL | Status: DC
  Administered 2020-11-16 – 2020-11-20 (×5): 10 mg via ORAL
  Filled 2020-11-16 (×5): qty 1

## 2020-11-16 NOTE — Evaluation (Signed)
Occupational Therapy Evaluation Patient Details Name: Judy Meyer MRN: [id number] DOB: October 05, 1960 Today's Date: 11/16/2020    History of Present Illness Pt admitted with nausea, comiting and abdominal pain.  Pt with hx of liver disease and uterine fibroid.  Physician recommendation for surgical evaluation for possible cholecystectomy   Clinical Impression   Overall patient presents with generalized weakness and decreased activity tolerance but able to perform functional mobility and ADLs at baseline. Patient reports having a walker at home. Therapist recommended shower chair for safety as patient exhibits poor endurance. Patient not agreeable to [name] services at this time as she wants to wait and see how she does on return home. Therapist recommended potential OP therapy eventually if patient continues to have poor activity tolerance. Patient verbalized understanding. No further OT needs.    Follow Up Recommendations  No OT follow up    Equipment Recommendations  Tub/shower seat    Recommendations for Other Services       Precautions / Restrictions Precautions Precautions: Fall Restrictions Weight Bearing Restrictions: No      Mobility Bed Mobility Overal bed mobility: Modified Independent             General bed mobility comments: Pt to EOB sitting without physical assist    Transfers Overall transfer level: Modified independent               General transfer comment: wide BOS and use of UEs to assist but no physical assist required    Balance Overall balance assessment: Mild deficits observed, not formally tested Sitting-balance support: No upper extremity supported;Feet supported Sitting balance-Leahy Scale: Good     Standing balance support: No upper extremity supported Standing balance-Leahy Scale: Good                             ADL either performed or assessed with clinical judgement   ADL Overall ADL's : At baseline                                        General ADL Comments: Needs increased time to perform all tasks. Reports poor endurance.     Vision Baseline Vision/History: No visual deficits       Perception     Praxis      Pertinent Vitals/Pain Pain Assessment: Faces Faces Pain Scale: Hurts a little bit Pain Location: upper abdomen Pain Descriptors / Indicators: Aching;Sore Pain Intervention(s): Limited activity within patient's tolerance     Hand Dominance Right   Extremity/Trunk Assessment Upper Extremity Assessment Upper Extremity Assessment: Overall WFL for tasks assessed (functiona but not normal strength.)   Lower Extremity Assessment Lower Extremity Assessment: Generalized weakness       Communication Communication Communication: No difficulties   Cognition Arousal/Alertness: Awake/alert Behavior During Therapy: WFL for tasks assessed/performed Overall Cognitive Status: Within Functional Limits for tasks assessed                                 General Comments: mildly slow processing   General Comments       Exercises     Shoulder Instructions      Home Living Family/patient expects to be discharged to:: Private residence Living Arrangements: Spouse/significant other Available Help at Discharge: Family Type of Home: Apartment Home Access: Level entry  Home Layout: Two level Alternate Level Stairs-Number of Steps: flight   Bathroom Shower/Tub: Teacher, early years/pre: Standard     Home Equipment: Walker - standard          Prior Functioning/Environment Level of Independence: Independent        Comments: Pt states very weak prior to admit but feeling stronger        OT Problem List: Decreased activity tolerance;Impaired balance (sitting and/or standing);Pain      OT Treatment/Interventions:      OT Goals(Current goals can be found in the care plan section) Acute Rehab OT Goals Patient Stated Goal:  Regain strength and IND OT Goal Formulation: All assessment and education complete, DC therapy  OT Frequency:     Barriers to D/C:            Co-evaluation   Reason for Co-Treatment: To address functional/ADL transfers PT goals addressed during session: Mobility/safety with mobility OT goals addressed during session: ADL's and self-care      AM-PAC OT "6 Clicks" Daily Activity     Outcome Measure Help from another person eating meals?: None Help from another person taking care of personal grooming?: None Help from another person toileting, which includes using toliet, bedpan, or urinal?: None Help from another person bathing (including washing, rinsing, drying)?: None Help from another person to put on and taking off regular upper body clothing?: None Help from another person to put on and taking off regular lower body clothing?: None 6 Click Score: 24   End of Session Equipment Utilized During Treatment: Gait belt Nurse Communication: Mobility status  Activity Tolerance: Patient limited by fatigue Patient left: in chair;with call bell/phone within reach  OT Visit Diagnosis: Unsteadiness on feet (R26.81);Muscle weakness (generalized) (M62.81)                Time: 0867-6195 OT Time Calculation (min): 21 min Charges:  OT General Charges $OT Visit: 1 Visit OT Evaluation $OT Eval Low Complexity: 1 Low  Chrishon Martino, OTR/L [place]  Office [phone] Pager: [place] 11/16/2020, 11:17 AM

## 2020-11-16 NOTE — Plan of Care (Signed)
  Problem: Pain Managment: Goal: General experience of comfort will improve Outcome: Progressing   Problem: Safety: Goal: Ability to remain free from injury will improve Outcome: Progressing   Problem: Coping: Goal: Level of anxiety will decrease Outcome: Progressing   

## 2020-11-16 NOTE — Plan of Care (Signed)
  Problem: Education: Goal: Knowledge of General Education information will improve Description Including pain rating scale, medication(s)/side effects and non-pharmacologic comfort measures Outcome: Progressing   

## 2020-11-16 NOTE — Progress Notes (Signed)
Subjective: Patient reports having a rough night after the ultrasound, she states she had worsening right upper quadrant pain after ultrasound. Patient also states that at home she takes Benadryl for itching, which is not being given to her for QT prolongation. She was given Ativan 0.5 mg last night, and reports she was able to sleep for a few hours but had to wake up several times last night for urination and to have a bowel movement. With lactulose she states she has had 4-5 bowel movements, however stools are described as very small, minimal in amount, smear like.  Objective: Vital signs in last 24 hours: Temp:  [97.8 F (36.6 C)-97.9 F (36.6 C)] 97.8 F (36.6 C) (04/10 0515) Pulse Rate:  [81-85] 82 (04/10 0515) Resp:  [16-18] 16 (04/10 0515) BP: (141-161)/(79-91) 161/91 (04/10 0515) SpO2:  [98 %-99 %] 99 % (04/10 0515) Weight change:  Last BM Date: 11/11/20  PE: Obvious icterus GENERAL: Slow but understandable speech, follows commands appropriately, oriented x3, no asterixis, no tremors ABDOMEN: Mild tenderness in left upper quadrant, more pronounced tenderness in right upper quadrant EXTREMITIES: No deformity  Lab Results: Results for orders placed or performed during the hospital encounter of 11/14/20 (from the past 48 hour(s))  Lipase, blood     Status: Abnormal   Collection Time: 11/14/20  3:25 PM  Result Value Ref Range   Lipase 99 (H) 11 - 51 U/L    Comment: Performed at [facility], [place] [address] [place], [place] [address]  Comprehensive metabolic panel     Status: Abnormal   Collection Time: 11/14/20  3:25 PM  Result Value Ref Range   Sodium 132 (L) 135 - 145 mmol/L   Potassium 2.2 (LL) 3.5 - 5.1 mmol/L    Comment: CRITICAL RESULT CALLED TO, READ BACK BY AND VERIFIED WITH: BANNO,A. RN @1627  ON 04.08.2022 BY COHEN,K    Chloride 93 (L) 98 - 111 mmol/L   CO2 18 (L) 22 - 32 mmol/L   Glucose, Bld 95 70 - 99 mg/dL    Comment: Glucose reference  range applies only to samples taken after fasting for at least 8 hours.   BUN 6 6 - 20 mg/dL   Creatinine, Ser 0.88 0.44 - 1.00 mg/dL   Calcium 8.1 (L) 8.9 - 10.3 mg/dL   Total Protein 7.4 6.5 - 8.1 g/dL   Albumin 3.5 3.5 - 5.0 g/dL   AST 147 (H) 15 - 41 U/L   ALT 22 0 - 44 U/L   Alkaline Phosphatase 108 38 - 126 U/L   Total Bilirubin 9.4 (H) 0.3 - 1.2 mg/dL   GFR, Estimated >60 >60 mL/min    Comment: (NOTE) Calculated using the CKD-EPI Creatinine Equation (2021)    Anion gap 21 (H) 5 - 15    Comment: Performed at [facility], [place] [address], [place], [place] [address]  CBC     Status: Abnormal   Collection Time: 11/14/20  3:25 PM  Result Value Ref Range   WBC 7.9 4.0 - 10.5 K/uL   RBC 4.32 3.87 - 5.11 MIL/uL   Hemoglobin 13.4 12.0 - 15.0 g/dL   HCT 41.0 36.0 - 46.0 %   MCV 94.9 80.0 - 100.0 fL   MCH 31.0 26.0 - 34.0 pg   MCHC 32.7 30.0 - 36.0 g/dL   RDW 20.4 (H) 11.5 - 15.5 %   Platelets 171 150 - 400 K/uL   nRBC 0.3 (H) 0.0 - 0.2 %  Comment: Performed at [facility], [place] [address] [place], [place] [address]  Protime-INR     Status: None   Collection Time: 11/14/20  3:25 PM  Result Value Ref Range   Prothrombin Time 14.9 11.4 - 15.2 seconds   INR 1.2 0.8 - 1.2    Comment: (NOTE) INR goal varies based on device and disease states. Performed at [facility], [place] [address], [place], [place] [address]   CK     Status: Abnormal   Collection Time: 11/14/20  3:25 PM  Result Value Ref Range   Total CK 537 (H) 38 - 234 U/L    Comment: Performed at [facility], [place] [address], [place], [place] [address]  POC CBG, ED     Status: None   Collection Time: 11/14/20  3:27 PM  Result Value Ref Range   Glucose-Capillary 86 70 - 99 mg/dL    Comment: Glucose reference range applies only to samples taken after fasting for at least 8 hours.  Ammonia     Status: Abnormal   Collection Time: 11/14/20  4:26  PM  Result Value Ref Range   Ammonia 125 (H) 9 - 35 umol/L    Comment: Performed at [facility], [place] [address] [place], [place] [address]  Magnesium     Status: Abnormal   Collection Time: 11/14/20  4:26 PM  Result Value Ref Range   Magnesium 1.3 (L) 1.7 - 2.4 mg/dL    Comment: Performed at [facility], [place] [address], [place], [place] [address]  Resp Panel by RT-PCR (Flu A&B, Covid) Nasopharyngeal Swab     Status: None   Collection Time: 11/14/20  6:41 PM   Specimen: Nasopharyngeal Swab; Nasopharyngeal(NP) swabs in vial transport medium  Result Value Ref Range   SARS Coronavirus 2 by RT PCR NEGATIVE NEGATIVE    Comment: (NOTE) SARS-CoV-2 target nucleic acids are NOT DETECTED.  The SARS-CoV-2 RNA is generally detectable in upper respiratory specimens during the acute phase of infection. The lowest concentration of SARS-CoV-2 viral copies this assay can detect is 138 copies/mL. A negative result does not preclude SARS-Cov-2 infection and should not be used as the sole basis for treatment or other patient management decisions. A negative result may occur with  improper specimen collection/handling, submission of specimen other than nasopharyngeal swab, presence of viral mutation(s) within the areas targeted by this assay, and inadequate number of viral copies(<138 copies/mL). A negative result must be combined with clinical observations, patient history, and epidemiological information. The expected result is Negative.  Fact Sheet for Patients:  EntrepreneurPulse.com.au  Fact Sheet for Healthcare Providers:  IncredibleEmployment.be  This test is no t yet approved or cleared by the [place] FDA and  has been authorized for detection and/or diagnosis of SARS-CoV-2 by FDA under an Emergency Use Authorization (EUA). This EUA will remain  in effect (meaning this test can be used) for the duration of  the COVID-19 declaration under Section 564(b)(1) of the Act, 21 U.S.C.section 360bbb-3(b)(1), unless the authorization is terminated  or revoked sooner.       Influenza A by PCR NEGATIVE NEGATIVE   Influenza B by PCR NEGATIVE NEGATIVE    Comment: (NOTE) The Xpert Xpress SARS-CoV-2/FLU/RSV plus assay is intended as an aid in the diagnosis of influenza from Nasopharyngeal swab specimens and should not be used as a sole basis for treatment. Nasal washings and aspirates are unacceptable for Xpert Xpress SARS-CoV-2/FLU/RSV testing.  Fact  Sheet for Patients: EntrepreneurPulse.com.au  Fact Sheet for Healthcare Providers: IncredibleEmployment.be  This test is not yet approved or cleared by the [place] FDA and has been authorized for detection and/or diagnosis of SARS-CoV-2 by FDA under an Emergency Use Authorization (EUA). This EUA will remain in effect (meaning this test can be used) for the duration of the COVID-19 declaration under Section 564(b)(1) of the Act, 21 U.S.C. section 360bbb-3(b)(1), unless the authorization is terminated or revoked.  Performed at [facility], [place] [address], [place], [place] [address]   Urinalysis, Routine w reflex microscopic     Status: Abnormal   Collection Time: 11/14/20  8:40 PM  Result Value Ref Range   Color, Urine AMBER (A) YELLOW    Comment: BIOCHEMICALS MAY BE AFFECTED BY COLOR   APPearance CLEAR CLEAR   Specific Gravity, Urine 1.027 1.005 - 1.030   pH 8.0 5.0 - 8.0   Glucose, UA NEGATIVE NEGATIVE mg/dL   Hgb urine dipstick SMALL (A) NEGATIVE   Bilirubin Urine NEGATIVE NEGATIVE   Ketones, ur NEGATIVE NEGATIVE mg/dL   Protein, ur NEGATIVE NEGATIVE mg/dL   Nitrite NEGATIVE NEGATIVE   Leukocytes,Ua NEGATIVE NEGATIVE   RBC / HPF 0-5 0 - 5 RBC/hpf   WBC, UA 0-5 0 - 5 WBC/hpf   Bacteria, UA NONE SEEN NONE SEEN   Squamous Epithelial / LPF 0-5 0 - 5    Comment: Performed at  [facility], [place] [address] [place], [place] [address]  Urine rapid drug screen (hosp performed)     Status: None   Collection Time: 11/14/20  8:40 PM  Result Value Ref Range   Opiates NONE DETECTED NONE DETECTED   Cocaine NONE DETECTED NONE DETECTED   Benzodiazepines NONE DETECTED NONE DETECTED   Amphetamines NONE DETECTED NONE DETECTED   Tetrahydrocannabinol NONE DETECTED NONE DETECTED   Barbiturates NONE DETECTED NONE DETECTED    Comment: (NOTE) DRUG SCREEN FOR MEDICAL PURPOSES ONLY.  IF CONFIRMATION IS NEEDED FOR ANY PURPOSE, NOTIFY LAB WITHIN 5 DAYS.  LOWEST DETECTABLE LIMITS FOR URINE DRUG SCREEN Drug Class                     Cutoff (ng/mL) Amphetamine and metabolites    1000 Barbiturate and metabolites    200 Benzodiazepine                 269 Tricyclics and metabolites     300 Opiates and metabolites        300 Cocaine and metabolites        300 THC                            50 Performed at [facility], [place] [address] [place], [place] [address]   Comprehensive metabolic panel     Status: Abnormal   Collection Time: 11/15/20  5:41 AM  Result Value Ref Range   Sodium 134 (L) 135 - 145 mmol/L   Potassium 2.3 (LL) 3.5 - 5.1 mmol/L    Comment: CRITICAL RESULT CALLED TO, READ BACK BY AND VERIFIED WITH: KNAPP,D. RN AT 2703 11/15/20 MULLINS,T    Chloride 101 98 - 111 mmol/L   CO2 19 (L) 22 - 32 mmol/L   Glucose, Bld 109 (H) 70 - 99 mg/dL    Comment: Glucose reference range applies only to samples taken after fasting for at least 8 hours.   BUN <5 (L) 6 -  20 mg/dL   Creatinine, Ser 0.77 0.44 - 1.00 mg/dL   Calcium 7.4 (L) 8.9 - 10.3 mg/dL   Total Protein 6.4 (L) 6.5 - 8.1 g/dL   Albumin 3.0 (L) 3.5 - 5.0 g/dL   AST 131 (H) 15 - 41 U/L   ALT 19 0 - 44 U/L   Alkaline Phosphatase 93 38 - 126 U/L   Total Bilirubin 8.1 (H) 0.3 - 1.2 mg/dL   GFR, Estimated >60 >60 mL/min    Comment: (NOTE) Calculated using the CKD-EPI Creatinine  Equation (2021)    Anion gap 14 5 - 15    Comment: Performed at [facility], [place] [address] [place], [place] [address]  CBC     Status: Abnormal   Collection Time: 11/15/20  5:41 AM  Result Value Ref Range   WBC 6.2 4.0 - 10.5 K/uL   RBC 3.92 3.87 - 5.11 MIL/uL   Hemoglobin 12.2 12.0 - 15.0 g/dL   HCT 37.5 36.0 - 46.0 %   MCV 95.7 80.0 - 100.0 fL   MCH 31.1 26.0 - 34.0 pg   MCHC 32.5 30.0 - 36.0 g/dL   RDW 20.7 (H) 11.5 - 15.5 %   Platelets 133 (L) 150 - 400 K/uL    Comment: REPEATED TO VERIFY   nRBC 0.0 0.0 - 0.2 %    Comment: Performed at [facility], [place] [address] [place], [place] [address]  Ammonia     Status: Abnormal   Collection Time: 11/15/20  5:41 AM  Result Value Ref Range   Ammonia 87 (H) 9 - 35 umol/L    Comment: Performed at [facility], [place] [address], [place], [place] [address]  Magnesium     Status: None   Collection Time: 11/15/20  5:41 AM  Result Value Ref Range   Magnesium 1.7 1.7 - 2.4 mg/dL    Comment: Performed at [facility], [place] [address], [place], [place] [address]  Bilirubin, fractionated(tot/dir/indir)     Status: Abnormal   Collection Time: 11/15/20  5:41 AM  Result Value Ref Range   Total Bilirubin 8.0 (H) 0.3 - 1.2 mg/dL   Bilirubin, Direct 4.7 (H) 0.0 - 0.2 mg/dL   Indirect Bilirubin 3.3 (H) 0.3 - 0.9 mg/dL    Comment: Performed at [facility], [place] [address], [place], [place] [address]  CK     Status: Abnormal   Collection Time: 11/15/20  5:41 AM  Result Value Ref Range   Total CK 428 (H) 38 - 234 U/L    Comment: Performed at [facility], [place] [address] [place], [place] [address]  TSH     Status: Abnormal   Collection Time: 11/15/20  8:31 AM  Result Value Ref Range   TSH 42.829 (H) 0.350 - 4.500 uIU/mL    Comment: Performed by a 3rd Generation assay with a functional sensitivity of <=0.01 uIU/mL. Performed at [facility], [place] [address] [place], [place] [address]   Comprehensive metabolic panel     Status: Abnormal   Collection Time: 11/15/20  2:44 PM  Result Value Ref Range   Sodium 134 (L) 135 - 145 mmol/L   Potassium 3.8 3.5 - 5.1 mmol/L    Comment: DELTA CHECK NOTED MODERATE HEMOLYSIS    Chloride 103 98 - 111 mmol/L   CO2 17 (L) 22 - 32 mmol/L   Glucose, Bld 125 (H) 70 - 99 mg/dL  Comment: Glucose reference range applies only to samples taken after fasting for at least 8 hours.   BUN <5 (L) 6 - 20 mg/dL   Creatinine, Ser 0.81 0.44 - 1.00 mg/dL   Calcium 8.3 (L) 8.9 - 10.3 mg/dL   Total Protein 6.1 (L) 6.5 - 8.1 g/dL   Albumin 3.0 (L) 3.5 - 5.0 g/dL   AST 124 (H) 15 - 41 U/L   ALT 22 0 - 44 U/L   Alkaline Phosphatase 92 38 - 126 U/L   Total Bilirubin 8.1 (H) 0.3 - 1.2 mg/dL   GFR, Estimated >60 >60 mL/min    Comment: (NOTE) Calculated using the CKD-EPI Creatinine Equation (2021)    Anion gap 14 5 - 15    Comment: Performed at [facility], [place] [address] [place], [place] [address]  Comprehensive metabolic panel     Status: Abnormal   Collection Time: 11/16/20  2:44 AM  Result Value Ref Range   Sodium 133 (L) 135 - 145 mmol/L   Potassium 4.3 3.5 - 5.1 mmol/L   Chloride 101 98 - 111 mmol/L   CO2 16 (L) 22 - 32 mmol/L   Glucose, Bld 101 (H) 70 - 99 mg/dL    Comment: Glucose reference range applies only to samples taken after fasting for at least 8 hours.   BUN <5 (L) 6 - 20 mg/dL   Creatinine, Ser 0.47 0.44 - 1.00 mg/dL   Calcium 8.0 (L) 8.9 - 10.3 mg/dL   Total Protein 6.3 (L) 6.5 - 8.1 g/dL   Albumin 3.1 (L) 3.5 - 5.0 g/dL   AST 118 (H) 15 - 41 U/L   ALT 19 0 - 44 U/L   Alkaline Phosphatase 99 38 - 126 U/L   Total Bilirubin 8.3 (H) 0.3 - 1.2 mg/dL   GFR, Estimated >60 >60 mL/min    Comment: (NOTE) Calculated using the CKD-EPI Creatinine Equation (2021)    Anion gap 16 (H) 5 - 15    Comment: Performed at [facility], [place] [address], [place], [place] [address]  CK     Status: Abnormal   Collection Time: 11/16/20  2:44 AM  Result Value Ref Range   Total CK 456 (H) 38 - 234 U/L    Comment: Performed at [facility], [place] [address], [place], [place] [address]  Protime-INR     Status: Abnormal   Collection Time: 11/16/20  2:44 AM  Result Value Ref Range   Prothrombin Time 27.1 (H) 11.4 - 15.2 seconds   INR 2.6 (H) 0.8 - 1.2    Comment: (NOTE) INR goal varies based on device and disease states. Performed at [facility], [place] [address] [place], [place] [address]     Studies/Results: CT Abdomen Pelvis W Contrast  Result Date: 11/14/2020 CLINICAL DATA:  Nausea vomiting epigastric EXAM: CT ABDOMEN AND PELVIS WITH CONTRAST TECHNIQUE: Multidetector CT imaging of the abdomen and pelvis was performed using the standard protocol following bolus administration of intravenous contrast. CONTRAST:  185mL OMNIPAQUE IOHEXOL 300 MG/ML  SOLN COMPARISON:  None. FINDINGS: Lower chest: The visualized heart size within normal limits. No pericardial fluid/thickening. No hiatal hernia. The visualized portions of the lungs are clear. Hepatobiliary: There is diffuse low density seen throughout the liver parenchyma with mild hepatomegaly.The main portal vein is patent. No evidence of calcified gallstones, gallbladder wall thickening or biliary dilatation. Fluid-filled dilated gallbladder is again noted. Pancreas: Unremarkable. No pancreatic ductal dilatation or surrounding  inflammatory changes. Spleen: Normal in size without focal abnormality. Adrenals/Urinary Tract: Both adrenal glands appear normal. There is a 3 cm low-density lesion seen in the upper pole of the right kidney. A punctate 4 mm calculus seen in lower pole of the right kidney. There is a punctate calcifications seen in the upper pole of the left kidney. The bladder is unremarkable. No hydronephrosis. Stomach/Bowel: There is  apparent mild wall thickening seen within the mid body of the stomach. However no surrounding inflammatory changes are seen. The small bowel, and colon are normal in appearance. No inflammatory changes, wall thickening, or obstructive findings. Vascular/Lymphatic: There are no enlarged mesenteric, retroperitoneal, or pelvic lymph nodes. No significant vascular findings are present. Reproductive: Again noted is a fibroid uterus some with partial internal calcifications noted. Other: No evidence of abdominal wall mass or hernia. Musculoskeletal: No acute or significant osseous findings. IMPRESSION: Hepatic steatosis and mild hepatomegaly Unchanged gallbladder hydrops Apparent wall thickening of the mid body of stomach which may be due to physiologic under distension versus mild gastritis Unchanged fibroid uterus Electronically Signed   By: Prudencio Pair M.D.   On: 11/14/2020 18:23   US ABDOMEN LIMITED RUQ (LIVER/GB)  Result Date: 11/15/2020 CLINICAL DATA:  Right upper quadrant pain for 1 week. EXAM: ULTRASOUND ABDOMEN LIMITED RIGHT UPPER QUADRANT COMPARISON:  July 22, 2020 FINDINGS: Gallbladder: The gallbladder is distended to 5.4 cm. No gallstones or wall thickening visualized. No sonographic Murphy sign noted by sonographer. Common bile duct: Diameter: 5 mm Liver: No focal lesion identified. Increased coarse parenchymal echogenicity. Portal vein is patent on color Doppler imaging with normal direction of blood flow towards the liver. Other: None. IMPRESSION: No evidence of acute cholecystitis. Unchanged gallbladder hydrops. Increased coarse echotexture of the liver, usually associated with hepatic steatosis or fibrosis. Electronically Signed   By: Fidela Salisbury M.D.   On: 11/15/2020 16:14    Medications: I have reviewed the patient's current medications.  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

## 2020-11-16 NOTE — Progress Notes (Signed)
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  Patient had extensive work-up for acute hepatitis in 07/2020.  She was seen by [facility] a pathologist outpatient, and completed steroid course.   In ED, hemodynamically stable.  K2.2.  AST 147.  Total bili 9.4 (lower than prior).  Ammonia 125.  CK 537.   The next day, GI consulted.  RUQ [place] negative for acute cholecystitis but unchanged gallbladder hydrops and increased coarse echotexture of the liver concerning for hepatic steatosis or fibrosis.  GI recommended general surgery recommendation for possible cholecystectomy.  Discussed with general surgery, Dr. Donne Hazel who didn't feel cholecystectomy is indicated.  Her TSH was also elevated to 43.   Subjective: Seen and examined earlier this morning.  No major events overnight or this morning.  Continues to endorse RUQ pain.  She rates her pain 5/10.  Continues to have nausea but no emesis today.  Having bowel movements.  She denies chest pain or shortness of breath.  Objective: Vitals:   11/15/20 0456 11/15/20 1350 11/15/20 2259 11/16/20 0515  BP: 124/74 (!) 141/79 (!) 157/86 (!) 161/91  Pulse: 81 81 85 82  Resp: 18 17 18 16   Temp: 98.8 F (37.1 C) 97.8 F (36.6 C) 97.9 F (36.6 C) 97.8 F (36.6 C)  TempSrc:    Oral  SpO2: 98% 98% 98% 99%  Weight:      Height:        Intake/Output Summary (Last 24 hours) at 11/16/2020 1207 Last data filed at 11/16/2020 0954 Gross  per 24 hour  Intake 3325 ml  Output --  Net 3325 ml   Filed Weights   11/14/20 1440 11/14/20 2250  Weight: 68 kg 73.2 kg    Examination:  GENERAL: No apparent distress.  Nontoxic. HEENT: MMM.  Icteric sclera NECK: Supple.  No apparent JVD.  RESP: On RA.  No IWOB.  Fair aeration bilaterally. CVS:  RRR. Heart sounds normal.  ABD/GI/GU: BS+. Abd soft.  RUQ tenderness with guarding.  MSK/EXT:  Moves extremities. No apparent deformity. No edema.  SKIN: Jaundice in her face NEURO: Awake and oriented appropriately.  No apparent focal neuro deficit. PSYCH: Calm. Normal affect.   Procedures:  None  Microbiology summarized: LPFXT-02 and influenza PCR nonreactive.  Assessment & Plan: Intactable nausea and vomiting-due to cholestasis?  CT abdomen and pelvis with hepatic steatosis, mild hepatomegaly, unchanged gallbladder hydrops on wall thickening of mid stomach.  RUQ ultrasound negative for acute cholecystitis but unchanged gallbladder hydrops and possible hepatic steatosis/fibrosis. -GI consulted general surgery recommendation for possible cholecystectomy -Case discussed with general surgery, Dr. Donne Hazel who didn't feel cholecystectomy is indicated. -Continue antiemetics and pain medications with IV fluid -Advance diet to full liquid  Acute hepatic encephalopathy: had some forgetfulness on presentation.  Ammonia 125>>> 84.  Improving. -Continue lactulose 20 g 3 times daily.  Adjust for goal of 2-3 bowel movements a day. -Continue monitoring  Subacute hepatitis/cholestasis-had extensive but unrevealing work-up recently. Elevated AST-could be due to rhabdo or alcohol. -GI consulted-appreciate input. -Continue home Ursodil -Cholestyramine for pruritus.  Coagulopathy-likely due to  subacute hepatitis.  PT 27.1.  INR 2.6.  APTT 26. -P.o. vitamin K 5 mg x 1 -Recheck in the morning  Hyponatremia: Stable. -Monitor  Hypokalemia/hypomagnesemia/hypocalcemia: Resolved.  -Monitor and  replenish as appropriate  Anion gap metabolic acidosis -Start sodium bicarb  Prolonged QT: Improved.  QTc 450.  Likely due to electrolyte arrangement -Minimize/avoid QT prolonging drugs -Optimize electrolytes  Hyperbilirubinemia: Likely due to underlying cholestasis.  Improving. -Continue monitoring  Elevated AST: Likely due to rhabdo.  Also reports occasional alcohol but "not more than half cup of vodka" -Encouraged to avoid alcohol altogether given underlying liver condition.  Mild nontraumatic rhabdomyolysis: CK 456. -Continue monitoring -Continue IV fluids  Conjunctivitis: Does not look bacteria on my exam.  -Continue erythromycin ointment q6hrs  Essential hypertension -Continue amlodipine  Hypothyroidism: TSH 43.  Reports not taking his Synthroid for 3 weeks due to "nausea". -Increase levothyroxine to 50 mcg daily  Depression: Stable -Resume home medications  Thrombocytopenia: Likely due to liver disease and alcohol.  Improving. -Continue monitoring  Debility/physical deconditioning -PT/OT eval  Body mass index is 26.05 kg/m.         DVT prophylaxis:  SCDs Start: 11/14/20 2136  Code Status: Full code Family Communication: Patient and/or RN. Available if any question.  Level of care: Med-Surg Status is: Inpatient  Remains inpatient appropriate because:Persistent severe electrolyte disturbances, Ongoing diagnostic testing needed not appropriate for outpatient work up, IV treatments appropriate due to intensity of illness or inability to take PO and Inpatient level of care appropriate due to severity of illness   Dispo: The patient is from: Home              Anticipated d/c is to: Home              Patient currently is not medically stable to d/c.   Difficult to place patient No       Consultants:  Gastroenterology General surgery over the phone    Sch Meds:  Scheduled Meds: . amLODipine  10 mg Oral Daily  . erythromycin   Both Eyes  Q6H  . feeding supplement  1 Container Oral TID BM  . lactulose  20 g Oral TID  . [START ON 11/17/2020] levothyroxine  50 mcg Oral Q0600  . sodium bicarbonate  650 mg Oral TID   Continuous Infusions: . sodium chloride Stopped (11/15/20 0300)  . 0.9 % NaCl with KCl 40 mEq / L 50 mL/hr at 11/16/20 0911   PRN Meds:.sodium chloride, LORazepam, morphine injection  Antimicrobials: Anti-infectives (From admission, onward)   None       I have personally reviewed the following labs and images: CBC: Recent Labs  Lab 11/14/20 1525 11/15/20 0541 11/16/20 0908  WBC 7.9 6.2 7.6  HGB 13.4 12.2 11.6*  HCT 41.0 37.5 36.7  MCV 94.9 95.7 99.2  PLT 171 133* 146*   BMP &GFR Recent Labs  Lab 11/14/20 1525 11/14/20 1626 11/15/20 0541 11/15/20 1444 11/16/20 0244  NA 132*  --  134* 134* 133*  K 2.2*  --  2.3* 3.8 4.3  CL 93*  --  101 103 101  CO2 18*  --  19* 17* 16*  GLUCOSE 95  --  109* 125* 101*  BUN 6  --  <5* <5* <5*  CREATININE 0.88  --  0.77 0.81 0.47  CALCIUM 8.1*  --  7.4* 8.3* 8.0*  MG  --  1.3* 1.7  --   --    Estimated Creatinine Clearance: 78.5 mL/min (by  C-G formula based on SCr of 0.47 mg/dL). Liver & Pancreas: Recent Labs  Lab 11/14/20 1525 11/15/20 0541 11/15/20 1444 11/16/20 0244  AST 147* 131* 124* 118*  ALT 22 19 22 19   ALKPHOS 108 93 92 99  BILITOT 9.4* 8.0*  8.1* 8.1* 8.3*  PROT 7.4 6.4* 6.1* 6.3*  ALBUMIN 3.5 3.0* 3.0* 3.1*   Recent Labs  Lab 11/14/20 1525  LIPASE 99*   Recent Labs  Lab 11/14/20 1626 11/15/20 0541 11/16/20 0908  AMMONIA 125* 87* 84*   Diabetic: No results for input(s): HGBA1C in the last 72 hours. Recent Labs  Lab 11/14/20 1527  GLUCAP 86   Cardiac Enzymes: Recent Labs  Lab 11/14/20 1525 11/15/20 0541 11/16/20 0244  CKTOTAL 537* 428* 456*   No results for input(s): PROBNP in the last 8760 hours. Coagulation Profile: Recent Labs  Lab 11/14/20 1525 11/16/20 0244  INR 1.2 2.6*   Thyroid Function  Tests: Recent Labs    11/15/20 0831  TSH 42.829*   Lipid Profile: No results for input(s): CHOL, HDL, LDLCALC, TRIG, CHOLHDL, LDLDIRECT in the last 72 hours. Anemia Panel: No results for input(s): VITAMINB12, FOLATE, FERRITIN, TIBC, IRON, RETICCTPCT in the last 72 hours. Urine analysis:    Component Value Date/Time   COLORURINE AMBER (A) 11/14/2020 2040   APPEARANCEUR CLEAR 11/14/2020 2040   LABSPEC 1.027 11/14/2020 2040   PHURINE 8.0 11/14/2020 2040   GLUCOSEU NEGATIVE 11/14/2020 2040   HGBUR SMALL (A) 11/14/2020 2040   BILIRUBINUR NEGATIVE 11/14/2020 2040   KETONESUR NEGATIVE 11/14/2020 2040   PROTEINUR NEGATIVE 11/14/2020 2040   NITRITE NEGATIVE 11/14/2020 2040   LEUKOCYTESUR NEGATIVE 11/14/2020 2040   Sepsis Labs: Invalid input(s): PROCALCITONIN, [place]  Microbiology: Recent Results (from the past 240 hour(s))  Resp Panel by RT-PCR (Flu A&B, Covid) Nasopharyngeal Swab     Status: None   Collection Time: 11/14/20  6:41 PM   Specimen: Nasopharyngeal Swab; Nasopharyngeal(NP) swabs in vial transport medium  Result Value Ref Range Status   SARS Coronavirus 2 by RT PCR NEGATIVE NEGATIVE Final    Comment: (NOTE) SARS-CoV-2 target nucleic acids are NOT DETECTED.  The SARS-CoV-2 RNA is generally detectable in upper respiratory specimens during the acute phase of infection. The lowest concentration of SARS-CoV-2 viral copies this assay can detect is 138 copies/mL. A negative result does not preclude SARS-Cov-2 infection and should not be used as the sole basis for treatment or other patient management decisions. A negative result may occur with  improper specimen collection/handling, submission of specimen other than nasopharyngeal swab, presence of viral mutation(s) within the areas targeted by this assay, and inadequate number of viral copies(<138 copies/mL). A negative result must be combined with clinical observations, patient history, and  epidemiological information. The expected result is Negative.  Fact Sheet for Patients:  EntrepreneurPulse.com.au  Fact Sheet for Healthcare Providers:  IncredibleEmployment.be  This test is no t yet approved or cleared by the [place] FDA and  has been authorized for detection and/or diagnosis of SARS-CoV-2 by FDA under an Emergency Use Authorization (EUA). This EUA will remain  in effect (meaning this test can be used) for the duration of the COVID-19 declaration under Section 564(b)(1) of the Act, 21 U.S.C.section 360bbb-3(b)(1), unless the authorization is terminated  or revoked sooner.       Influenza A by PCR NEGATIVE NEGATIVE Final   Influenza B by PCR NEGATIVE NEGATIVE Final    Comment: (NOTE) The Xpert Xpress SARS-CoV-2/FLU/RSV plus assay is intended as an aid  in the diagnosis of influenza from Nasopharyngeal swab specimens and should not be used as a sole basis for treatment. Nasal washings and aspirates are unacceptable for Xpert Xpress SARS-CoV-2/FLU/RSV testing.  Fact Sheet for Patients: EntrepreneurPulse.com.au  Fact Sheet for Healthcare Providers: IncredibleEmployment.be  This test is not yet approved or cleared by the [place] FDA and has been authorized for detection and/or diagnosis of SARS-CoV-2 by FDA under an Emergency Use Authorization (EUA). This EUA will remain in effect (meaning this test can be used) for the duration of the COVID-19 declaration under Section 564(b)(1) of the Act, 21 U.S.C. section 360bbb-3(b)(1), unless the authorization is terminated or revoked.  Performed at [facility], [place] [address], [place], [place] [address]     Radiology Studies: US ABDOMEN LIMITED RUQ (LIVER/GB)  Result Date: 11/15/2020 CLINICAL DATA:  Right upper quadrant pain for 1 week. EXAM: ULTRASOUND ABDOMEN LIMITED RIGHT UPPER QUADRANT COMPARISON:  July 22, 2020 FINDINGS: Gallbladder: The gallbladder is distended to 5.4 cm. No gallstones or wall thickening visualized. No sonographic Murphy sign noted by sonographer. Common bile duct: Diameter: 5 mm Liver: No focal lesion identified. Increased coarse parenchymal echogenicity. Portal vein is patent on color Doppler imaging with normal direction of blood flow towards the liver. Other: None. IMPRESSION: No evidence of acute cholecystitis. Unchanged gallbladder hydrops. Increased coarse echotexture of the liver, usually associated with hepatic steatosis or fibrosis. Electronically Signed   By: Fidela Salisbury M.D.   On: 11/15/2020 16:14     Lavin Petteway T. Medora  If 7PM-7AM, please contact night-coverage [email or web address] 11/16/2020, 12:07 PM

## 2020-11-16 NOTE — Progress Notes (Signed)
Initial Nutrition Assessment  RD working remotely.  DOCUMENTATION CODES:   Not applicable  INTERVENTION:  - continue Boost Breeze TID, each supplement provides 250 kcal and 9 grams of protein. - will order 30 ml Prosource Plus TID, each supplement provides 100 kcal and 15 grams protein.  - will order 1 tablet multivitamin with minerals/day. - diet advancement as medically feasible. - complete NFPE when feasible.    NUTRITION DIAGNOSIS:   Inadequate oral intake related to acute illness,nausea,vomiting as evidenced by per patient/family report.  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 She presented to the ED with worsening N/V, constipation, RUQ abdominal pain, and confusion. She was admitted for hepatic encephalopathy and intractable N/V.  Patient had extensive work-up for acute hepatitis in 07/2020.  Diet advanced from NPO to CLD on 4/8 at 2137 and since that time documented intakes have been 100% of breakfast and 0% of lunch yesterday.  She was last seen by a [place] RD on 12/23 at which time she met criteria for moderate malnutrition related to N/V, acute illness as evidenced by moderate fat depletions and moderate muscle depletions.   Weight on 4/8 was 161 lb and weight on 08/06/20 was 176 lb. This indicates 15 lb weight loss (8.5% body weight) in the past 3.5 months; significant for time frame.   Per notes: - intractable N/V--cholecystectomy is not indicated - acute hepatic encephalopathy--resolved - hepatitis/cholestasis - metabolic acidosis - mild nontraumatic  rhabdomyolysis - debility/deconditioning   Labs reviewed; Na: 133 mmol/l, BUN: <5 mg/dl, Ca: 8 mg/dl, AST elevated, ammonia: 84 umol/l. Medications reviewed; 20 g lactulose BID, 50 mcg oral synthroid/day, 10 mg vitamin K x1 run 4/10, 650 mg oral sodium bicarb TID. IVF; NS-40 mEq KCl @ 50 ml/hr.    NUTRITION - FOCUSED PHYSICAL EXAM:  unable to complete at this time.  Diet Order:   Diet Order            Diet clear liquid Room service appropriate? Yes; Fluid consistency: Thin  Diet effective now                 EDUCATION NEEDS:   Not appropriate for education at this time  Skin:  Skin Assessment: Reviewed RN Assessment  Last BM:  4/10  Height:   Ht Readings from Last 1 Encounters:  11/14/20 $RemoveB'5\' 6"'aiqMccow$  (1.676 m)    Weight:   Wt Readings from Last 1 Encounters:  11/14/20 73.2 kg     Estimated Nutritional Needs:  Kcal:  1900-2100 kcal Protein:  90-105 grams Fluid:  >/= 2.3 L/day      Jarome Matin, MS, RD, LDN, CNSC Inpatient Clinical Dietitian RD pager # available in AMION  After hours/weekend pager # available in [facility]

## 2020-11-16 NOTE — Evaluation (Signed)
Physical Therapy Evaluation Patient Details Name: Judy Meyer MRN: [id number] DOB: 1961-02-24 Today's Date: 11/16/2020   History of Present Illness  Pt admitted with nausea, comiting and abdominal pain.  Pt with hx of liver disease and uterine fibroid.  Physician recommendation for surgical evaluation for possible cholecystectomy  Clinical Impression  Pt admitted as above and presenting with functional mobility limitations 2* generalized weakness, ongoing abdominal pain, and ambulatory balance deficits.  Pt currently mobilizing at min guard/SUP level and reports feeling much stronger than on admit.  Pt should progress to dc with intermittent assist of family.  Pt states she prefers to work on regaining strength at home with assist of spouse but no OT/PT intervention at this time.    Follow Up Recommendations No PT follow up (Pt declines at this time)    Equipment Recommendations  None recommended by PT    Recommendations for Other Services       Precautions / Restrictions Precautions Precautions: Fall Restrictions Weight Bearing Restrictions: No      Mobility  Bed Mobility Overal bed mobility: Modified Independent             General bed mobility comments: Pt to EOB sitting without physical assist    Transfers Overall transfer level: Modified independent               General transfer comment: wide BOS and use of UEs to assist but no physical assist required  Ambulation/Gait Ambulation/Gait assistance: Min guard;Supervision Gait Distance (Feet): 130 Feet (twice) Assistive device: None Gait Pattern/deviations: Step-through pattern;Decreased step length - right;Decreased step length - left;Shuffle;Trunk flexed Gait velocity: dec   General Gait Details: General instability compensated with decreased knee flex, decreased stride length and wide BOS.  No overt LOB and increased stability with increased distance ambulated  Stairs            Wheelchair  Mobility    Modified Rankin (Stroke Patients Only)       Balance Overall balance assessment: Needs assistance Sitting-balance support: No upper extremity supported;Feet supported Sitting balance-Leahy Scale: Good     Standing balance support: No upper extremity supported Standing balance-Leahy Scale: Good                               Pertinent Vitals/Pain Pain Assessment: Faces Faces Pain Scale: Hurts a little bit Pain Location: upper abdomen Pain Descriptors / Indicators: Aching;Sore Pain Intervention(s): Limited activity within patient's tolerance;Monitored during session;Premedicated before session;Ice applied    Home Living Family/patient expects to be discharged to:: Private residence Living Arrangements: Spouse/significant other Available Help at Discharge: Family Type of Home: Apartment Home Access: Level entry     Home Layout: Two level Home Equipment: Environmental consultant - standard      Prior Function Level of Independence: Independent         Comments: Pt states very weak prior to admit but feeling stronger     Hand Dominance   Dominant Hand: Right    Extremity/Trunk Assessment   Upper Extremity Assessment Upper Extremity Assessment: Defer to OT evaluation    Lower Extremity Assessment Lower Extremity Assessment: Generalized weakness       Communication   Communication: No difficulties  Cognition Arousal/Alertness: Awake/alert Behavior During Therapy: WFL for tasks assessed/performed Overall Cognitive Status: Within Functional Limits for tasks assessed  General Comments      Exercises     Assessment/Plan    PT Assessment Patient needs continued PT services  PT Problem List Decreased strength;Decreased activity tolerance;Decreased balance;Decreased mobility;Pain       PT Treatment Interventions DME instruction;Gait training;Stair training;Functional mobility  training;Therapeutic activities;Therapeutic exercise;Balance training;Patient/family education    PT Goals (Current goals can be found in the Care Plan section)  Acute Rehab PT Goals Patient Stated Goal: Regain strength and IND PT Goal Formulation: With patient Time For Goal Achievement: 11/30/20 Potential to Achieve Goals: Good    Frequency Min 3X/week   Barriers to discharge        Co-evaluation PT/OT/SLP Co-Evaluation/Treatment: Yes Reason for Co-Treatment: To address functional/ADL transfers PT goals addressed during session: Mobility/safety with mobility OT goals addressed during session: ADL's and self-care       AM-PAC PT "6 Clicks" Mobility  Outcome Measure Help needed turning from your back to your side while in a flat bed without using bedrails?: None Help needed moving from lying on your back to sitting on the side of a flat bed without using bedrails?: None Help needed moving to and from a bed to a chair (including a wheelchair)?: A Little Help needed standing up from a chair using your arms (e.g., wheelchair or bedside chair)?: A Little Help needed to walk in hospital room?: A Little Help needed climbing 3-5 steps with a railing? : A Little 6 Click Score: 20    End of Session Equipment Utilized During Treatment: Gait belt Activity Tolerance: Patient tolerated treatment well;Patient limited by fatigue Patient left: in chair;with call bell/phone within reach Nurse Communication: Mobility status PT Visit Diagnosis: Unsteadiness on feet (R26.81);Muscle weakness (generalized) (M62.81);Difficulty in walking, not elsewhere classified (R26.2);Pain    Time: 4196-2229 PT Time Calculation (min) (ACUTE ONLY): 23 min   Charges:   PT Evaluation $PT Eval Low Complexity: 1 Low          [place] Pager [phone] Office [phone]   Janis Sol 11/16/2020, 10:38 AM

## 2020-11-17 LAB — COMPREHENSIVE METABOLIC PANEL
ALT: 20 U/L (ref 0–44)
AST: 88 U/L — ABNORMAL HIGH (ref 15–41)
Albumin: 2.7 g/dL — ABNORMAL LOW (ref 3.5–5.0)
Alkaline Phosphatase: 95 U/L (ref 38–126)
Anion gap: 11 (ref 5–15)
BUN: <5 mg/dL — ABNORMAL LOW (ref 6–20)
CO2: 18 mmol/L — ABNORMAL LOW (ref 22–32)
Calcium: 8.5 mg/dL — ABNORMAL LOW (ref 8.9–10.3)
Chloride: 104 mmol/L (ref 98–111)
Creatinine, Ser: 0.61 mg/dL (ref 0.44–1.00)
GFR, Estimated: >60 mL/min (ref >60)
Glucose, Bld: 97 mg/dL (ref 70–99)
Potassium: 3.1 mmol/L — ABNORMAL LOW (ref 3.5–5.1)
Sodium: 133 mmol/L — ABNORMAL LOW (ref 135–145)
Total Bilirubin: 8 mg/dL — ABNORMAL HIGH (ref 0.3–1.2)
Total Protein: 5.9 g/dL — ABNORMAL LOW (ref 6.5–8.1)

## 2020-11-17 LAB — AMMONIA: Ammonia: 53 umol/L — ABNORMAL HIGH (ref 9–35)

## 2020-11-17 LAB — CBC
HCT: 32.7 % — ABNORMAL LOW (ref 36.0–46.0)
Hemoglobin: 10.5 g/dL — ABNORMAL LOW (ref 12.0–15.0)
MCH: 31.9 pg (ref 26.0–34.0)
MCHC: 32.1 g/dL (ref 30.0–36.0)
MCV: 99.4 fL (ref 80.0–100.0)
Platelets: 141 10*3/uL — ABNORMAL LOW (ref 150–400)
RBC: 3.29 MIL/uL — ABNORMAL LOW (ref 3.87–5.11)
RDW: 21.2 % — ABNORMAL HIGH (ref 11.5–15.5)
WBC: 5.9 10*3/uL (ref 4.0–10.5)
nRBC: 0.3 % — ABNORMAL HIGH (ref 0.0–0.2)

## 2020-11-17 LAB — CK: Total CK: 330 U/L — ABNORMAL HIGH (ref 38–234)

## 2020-11-17 LAB — PROTIME-INR
INR: 1 (ref 0.8–1.2)
Prothrombin Time: 13 seconds (ref 11.4–15.2)

## 2020-11-17 LAB — MAGNESIUM: Magnesium: 1.5 mg/dL — ABNORMAL LOW (ref 1.7–2.4)

## 2020-11-17 MED ORDER — CHOLESTYRAMINE LIGHT 4 G PO PACK
4.0000 g | PACK | Freq: Two times a day (BID) | ORAL | Status: DC
  Administered 2020-11-17 – 2020-11-20 (×7): 4 g via ORAL
  Filled 2020-11-17 (×8): qty 1

## 2020-11-17 MED ORDER — POTASSIUM CHLORIDE CRYS ER 20 MEQ PO TBCR
40.0000 meq | EXTENDED_RELEASE_TABLET | ORAL | Status: AC
  Administered 2020-11-17 (×2): 40 meq via ORAL
  Filled 2020-11-17 (×2): qty 2

## 2020-11-17 MED ORDER — ALBUTEROL SULFATE HFA 108 (90 BASE) MCG/ACT IN AERS
2.0000 | INHALATION_SPRAY | Freq: Four times a day (QID) | RESPIRATORY_TRACT | Status: DC | PRN
  Administered 2020-11-17: 2 via RESPIRATORY_TRACT
  Filled 2020-11-17 (×2): qty 6.7

## 2020-11-17 MED ORDER — MAGNESIUM SULFATE 4 GM/100ML IV SOLN
4.0000 g | Freq: Once | INTRAVENOUS | Status: AC
  Administered 2020-11-17: 4 g via INTRAVENOUS
  Filled 2020-11-17: qty 100

## 2020-11-17 NOTE — Care Management Important Message (Signed)
Important Message  Patient Details IM Letter given to the Patient Name: Judy Meyer MRN: [id number] Date of Birth: [birth year]   Medicare Important Message Given:  Yes     Kerin Salen 11/17/2020, 3:02 PM

## 2020-11-17 NOTE — Progress Notes (Signed)
[facility] Progress Note  Judy Meyer 60 y.o. 10-09-1960  CC:  RUQ abdominal pain, N/V  Subjective: Patient reports continued abdominal pain, worst in RUQ but also present in epigastrium.  Reports nausea but denies vomiting.  ROS : Review of Systems  Cardiovascular: Negative for chest pain and palpitations.  Gastrointestinal: Positive for abdominal pain and nausea. Negative for blood in stool, constipation, diarrhea, heartburn, melena and vomiting.   Objective: Vital signs in last 24 hours: Vitals:   11/16/20 2226 11/17/20 0505  BP: (!) 150/84 136/77  Pulse: 92 88  Resp: 16 18  Temp: 98.4 F (36.9 C) 98 F (36.7 C)  SpO2: 98% 99%    Physical Exam:  General:  Lethargic, cooperative, no distress  Head:  Normocephalic, without obvious abnormality, atraumatic  Eyes:  Scleral icterus, EOM's intact,   Lungs:   Clear to auscultation bilaterally, respirations unlabored  Heart:  Regular rate and rhythm, S1, S2 normal  Abdomen:   Soft, moderate RUQ tenderness with voluntary guarding, bowel sounds active all four quadrants   Extremities: Extremities normal, atraumatic, no  edema  Pulses: 2+ and symmetric    Lab Results: Recent Labs    11/15/20 0541 11/15/20 1444 11/16/20 0244 11/17/20 0437  NA 134*   < > 133* 133*  K 2.3*   < > 4.3 3.1*  CL 101   < > 101 104  CO2 19*   < > 16* 18*  GLUCOSE 109*   < > 101* 97  BUN <5*   < > <5* <5*  CREATININE 0.77   < > 0.47 0.61  CALCIUM 7.4*   < > 8.0* 8.5*  MG 1.7  --   --  1.5*   < > = values in this interval not displayed.   Recent Labs    11/16/20 0244 11/17/20 0437  AST 118* 88*  ALT 19 20  ALKPHOS 99 95  BILITOT 8.3* 8.0*  PROT 6.3* 5.9*  ALBUMIN 3.1* 2.7*   Recent Labs    11/16/20 0908 11/17/20 0437  WBC 7.6 5.9  HGB 11.6* 10.5*  HCT 36.7 32.7*  MCV 99.2 99.4  PLT 146* 141*   Recent Labs    11/16/20 0244 11/17/20 0437  LABPROT 27.1* 13.0  INR 2.6* 1.0    Assessment/Plan: Nausea,  vomiting, right upper quadrant pain, gallbladder hydrops  Elevated LFTs, T bili 8.3/AST 118/ALT 19/ALP 99, suspect alcoholic hepatitis -Detailed prior work-up including labs and liver biopsy showed possible drug-induced liver injury versus secondary sclerosing cholangitis, H63D gene mutation positive for hemochromatosis -Patient noted to have elevation in PT and INR, 27.1/2.6, improved to 13.0/1.0 after Vitamin K  Hypothyroidism, severely elevated TSH  Plan: Recommend surgical evaluation for consideration for cholecystectomy.  Continue clear liquid diet for now along with lactulose 20 g 3 times a day.  [facility] will follow.  Salley Slaughter PA-C 11/17/2020, 11:13 AM  Contact #  [phone]

## 2020-11-17 NOTE — Progress Notes (Signed)
PT Cancellation Note  Patient Details Name: Judy Meyer MRN: [id number] DOB: July 03, 1961   Cancelled Treatment:    Reason Eval/Treat Not Completed: Fatigue/lethargy limiting ability to participate Pt politely declined, reports being too tired at this time.    Sitlali Koerner,KATHrine E 11/17/2020, 2:31 PM Jannette Spanner PT, DPT Acute Rehabilitation Services Pager: [phone] Office: [phone]

## 2020-11-17 NOTE — Consult Note (Signed)
[facility] Surgery Consult Note  Judy Meyer Aug 25, 1960  [id number].    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 Patient reports a 6 month history of intermittent RUQ abdominal pain. States that the pain was random, nothing specific made it better or worse. Over the last week she has had progressive worsening RUQ pain. It now radiates into her back and has been associated with nausea, vomiting, and chills. Appetite suppressed due to nausea. She has chronic diarrhea that is unchanged. Per the chart, patient's boyfriend noted her to be jaundiced and more forgetful x2 days so he brought her to the ED.  In the ED patient underwent lab work revealing WBC 7.9, elevated LFTs with Tbili 9.4 (down from 20, 3 months ago). Abdominal u/s shows no evidence of acute cholecystitis, unchanged gallbladder hydrops, no gallstones, increased coarse echotexture of the liver. Today WBC 5.9 and LFTs continue to trend down with Tbili 8.0. General surgery asked to see for possible cholecystectomy.   Of note, patient was admitted in December 2021 for nausea, vomiting, and jaundice. She underwent abdominal ultrasound which showed gallbladder stones and sludge.  Subsequent MRCP on 12/13 did not appreciate gallstones, showed no biliary ductal dilatation or stricture, no focal liver lesions but did note hepatomegaly.  GI was consulted and she underwent ultrasound-guided liver biopsy which was consistent with severe hepatic steatosis intrahepatic cholestasis.  Hepatology team at [facility] was consulted and recommended she be discharged on prednisone and ursodiol.  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 She is following up with [facility] liver care and transplant office in [place].  -Abdominal surgical history: abdominoplasty ~25 years ago -Anticoagulants: none -Quit smoking ~3 months ago, previously only smoked occasionally when she had a migraine -Denies h/o heavy alcohol use, currently does not drink alcohol, at most she reports drinking 3 shots of liquor weekly -Denies illicit drug use -Employment: pharmacist, not currently working -Lives at home with boyfriend  Review of Systems  Respiratory: Negative.   Cardiovascular: Negative.   Gastrointestinal: Positive for abdominal pain, diarrhea, nausea and vomiting.  Musculoskeletal: Positive for back pain.  Skin: Positive for itching.       jaundice   All systems reviewed and otherwise negative except for as above  Family History  Problem Relation Age of Onset  . Hypertension Other   . Hypertension Mother   . High Cholesterol Mother     Past Medical History:  Diagnosis Date  . Hypertension   . Hypothyroidism   . Liver disease   . Uterine fibroid     Past Surgical History:  Procedure Laterality Date  . hand fracture surgery Right   . LIVER BIOPSY      Social History:  reports that she has never smoked. She has never used smokeless tobacco. She reports previous alcohol use. She reports that she does not use drugs.  Allergies:  Allergies  Allergen Reactions  . Compazine [Prochlorperazine Edisylate] Anaphylaxis  . Periactin [Cyproheptadine] Anaphylaxis  . Cyclobenzaprine     Palpations   . Haloperidol And Related   . Metoclopramide Other (See Comments)  . Other Other (See Comments)  . Prochlorperazine Other (See Comments)    Medications Prior  to Admission  Medication Sig Dispense Refill  . albuterol (VENTOLIN HFA) 108 (90 Base) MCG/ACT inhaler Inhale 2 puffs into the lungs 4 (four) times daily as needed for wheezing or shortness  of breath.    Marland Kitchen amLODipine (NORVASC) 5 MG tablet Take 5 mg by mouth daily.    . Galcanezumab-gnlm 120 MG/ML SOAJ Inject 120 mLs into the skin every 30 (thirty) days.    Marland Kitchen levothyroxine (SYNTHROID) 25 MCG tablet Take 25 mcg by mouth every morning.    . pantoprazole (PROTONIX) 40 MG tablet Take 1 tablet by mouth daily.    Marland Kitchen venlafaxine XR (EFFEXOR-XR) 75 MG 24 hr capsule Take 75 mg by mouth daily.    Marland Kitchen amLODipine (NORVASC) 5 MG tablet Take 1 tablet (5 mg total) by mouth daily. 30 tablet 1  . famotidine (PEPCID) 20 MG tablet Take 1 tablet (20 mg total) by mouth daily. (Patient not taking: Reported on 11/14/2020) 30 tablet 0  . folic acid (FOLVITE) 1 MG tablet Take 1 tablet (1 mg total) by mouth daily. (Patient not taking: Reported on 11/14/2020) 30 tablet 3  . oxyCODONE (OXY IR/ROXICODONE) 5 MG immediate release tablet Take 1-2 tablets (5-10 mg total) by mouth every 6 (six) hours as needed for severe pain. (Patient not taking: Reported on 11/14/2020) 30 tablet 0  . polyethylene glycol (MIRALAX / GLYCOLAX) 17 g packet Take 17 g by mouth daily. (Patient not taking: Reported on 11/14/2020) 30 each 0  . prednisoLONE 5 MG TABS tablet Take 8 tablets (40 mg total) by mouth daily. (Patient not taking: Reported on 11/14/2020) 240 tablet 1  . thiamine 100 MG tablet Take 1 tablet (100 mg total) by mouth daily. (Patient not taking: No sig reported) 30 tablet 0  . ursodiol (ACTIGALL) 300 MG capsule Take 2 capsules (600 mg total) by mouth 2 (two) times daily. (Patient not taking: Reported on 11/14/2020) 120 capsule 1    Prior to Admission medications   Medication Sig Start Date End Date Taking? Authorizing Provider  albuterol (VENTOLIN HFA) 108 (90 Base) MCG/ACT inhaler Inhale 2 puffs into the lungs 4 (four) times daily as needed for wheezing or shortness of breath. 12/20/19  Yes [provider]  amLODipine (NORVASC) 5 MG tablet Take 5 mg by mouth daily.   Yes [provider]  Galcanezumab-gnlm 120 MG/ML  SOAJ Inject 120 mLs into the skin every 30 (thirty) days.   Yes [provider]  levothyroxine (SYNTHROID) 25 MCG tablet Take 25 mcg by mouth every morning. 08/19/20  Yes [provider]  pantoprazole (PROTONIX) 40 MG tablet Take 1 tablet by mouth daily. 11/07/20  Yes [provider]  venlafaxine XR (EFFEXOR-XR) 75 MG 24 hr capsule Take 75 mg by mouth daily.   Yes [provider]  amLODipine (NORVASC) 5 MG tablet Take 1 tablet (5 mg total) by mouth daily. 08/06/20 09/05/20  Bonnielee Haff, MD  famotidine (PEPCID) 20 MG tablet Take 1 tablet (20 mg total) by mouth daily. Patient not taking: Reported on 11/14/2020 08/07/20   Bonnielee Haff, MD  folic acid (FOLVITE) 1 MG tablet Take 1 tablet (1 mg total) by mouth daily. Patient not taking: Reported on 11/14/2020 08/07/20   Bonnielee Haff, MD  oxyCODONE (OXY IR/ROXICODONE) 5 MG immediate release tablet Take 1-2 tablets (5-10 mg total) by mouth every 6 (six) hours as needed for severe pain. Patient not taking: Reported on 11/14/2020 08/06/20   Bonnielee Haff, MD  polyethylene glycol (MIRALAX / GLYCOLAX) 17 g packet  Take 17 g by mouth daily. Patient not taking: Reported on 11/14/2020 08/07/20   Bonnielee Haff, MD  prednisoLONE 5 MG TABS tablet Take 8 tablets (40 mg total) by mouth daily. Patient not taking: Reported on 11/14/2020 08/07/20   Bonnielee Haff, MD  thiamine 100 MG tablet Take 1 tablet (100 mg total) by mouth daily. Patient not taking: No sig reported 08/07/20   Bonnielee Haff, MD  ursodiol (ACTIGALL) 300 MG capsule Take 2 capsules (600 mg total) by mouth 2 (two) times daily. Patient not taking: Reported on 11/14/2020 08/06/20   Bonnielee Haff, MD    Blood pressure 136/77, pulse 88, temperature 98 F (36.7 C), resp. rate 18, height 5\' 6"  (1.676 m), weight 73.2 kg, last menstrual period 06/11/2018, SpO2 99 %. Physical Exam: General: pleasant, WD/WN female who is laying in bed in NAD HEENT: head is  normocephalic, atraumatic.  Scleral icterus present.  Pupils equal and round.  Ears and nose without any masses or lesions.  Mouth is pink and moist. Dentition fair Heart: regular, rate, and rhythm.  Normal s1,s2. No obvious murmurs, gallops, or rubs noted.  Palpable pedal pulses bilaterally  Lungs: CTAB, no wheezes, rhonchi, or rales noted.  Respiratory effort nonlabored Abd: well healed lower transverse abdominal incision, soft, ND, +BS, no masses, hernias, or organomegaly. TTP RUQ/epigastric region without rebound or guarding, no peritonitis MS: no BUE/BLE edema, calves soft and nontender Skin: warm and dry with no masses, lesions, or rashes. Jaundice  Psych: A&Ox4 with an appropriate affect Neuro: cranial nerves grossly intact, equal strength in BUE/BLE bilaterally, normal speech, thought process intact  Results for orders placed or performed during the hospital encounter of 11/14/20 (from the past 48 hour(s))  Comprehensive metabolic panel     Status: Abnormal   Collection Time: 11/15/20  2:44 PM  Result Value Ref Range   Sodium 134 (L) 135 - 145 mmol/L   Potassium 3.8 3.5 - 5.1 mmol/L    Comment: DELTA CHECK NOTED MODERATE HEMOLYSIS    Chloride 103 98 - 111 mmol/L   CO2 17 (L) 22 - 32 mmol/L   Glucose, Bld 125 (H) 70 - 99 mg/dL    Comment: Glucose reference range applies only to samples taken after fasting for at least 8 hours.   BUN <5 (L) 6 - 20 mg/dL   Creatinine, Ser 0.81 0.44 - 1.00 mg/dL   Calcium 8.3 (L) 8.9 - 10.3 mg/dL   Total Protein 6.1 (L) 6.5 - 8.1 g/dL   Albumin 3.0 (L) 3.5 - 5.0 g/dL   AST 124 (H) 15 - 41 U/L   ALT 22 0 - 44 U/L   Alkaline Phosphatase 92 38 - 126 U/L   Total Bilirubin 8.1 (H) 0.3 - 1.2 mg/dL   GFR, Estimated >60 >60 mL/min    Comment: (NOTE) Calculated using the CKD-EPI Creatinine Equation (2021)    Anion gap 14 5 - 15    Comment: Performed at [facility], [place] [address], [place], [place] [address]  Comprehensive metabolic  panel     Status: Abnormal   Collection Time: 11/16/20  2:44 AM  Result Value Ref Range   Sodium 133 (L) 135 - 145 mmol/L   Potassium 4.3 3.5 - 5.1 mmol/L   Chloride 101 98 - 111 mmol/L   CO2 16 (L) 22 - 32 mmol/L   Glucose, Bld 101 (H) 70 - 99 mg/dL    Comment: Glucose reference range applies only to samples taken after fasting for at  least 8 hours.   BUN <5 (L) 6 - 20 mg/dL   Creatinine, Ser 0.47 0.44 - 1.00 mg/dL   Calcium 8.0 (L) 8.9 - 10.3 mg/dL   Total Protein 6.3 (L) 6.5 - 8.1 g/dL   Albumin 3.1 (L) 3.5 - 5.0 g/dL   AST 118 (H) 15 - 41 U/L   ALT 19 0 - 44 U/L   Alkaline Phosphatase 99 38 - 126 U/L   Total Bilirubin 8.3 (H) 0.3 - 1.2 mg/dL   GFR, Estimated >60 >60 mL/min    Comment: (NOTE) Calculated using the CKD-EPI Creatinine Equation (2021)    Anion gap 16 (H) 5 - 15    Comment: Performed at [facility], [place] [address] [place], [place] [address]  CK     Status: Abnormal   Collection Time: 11/16/20  2:44 AM  Result Value Ref Range   Total CK 456 (H) 38 - 234 U/L    Comment: Performed at [facility], [place] [address] [place], [place] [address]  Protime-INR     Status: Abnormal   Collection Time: 11/16/20  2:44 AM  Result Value Ref Range   Prothrombin Time 27.1 (H) 11.4 - 15.2 seconds   INR 2.6 (H) 0.8 - 1.2    Comment: (NOTE) INR goal varies based on device and disease states. Performed at [facility], [place] [address], [place], [place] [address]   CBC     Status: Abnormal   Collection Time: 11/16/20  9:08 AM  Result Value Ref Range   WBC 7.6 4.0 - 10.5 K/uL   RBC 3.70 (L) 3.87 - 5.11 MIL/uL   Hemoglobin 11.6 (L) 12.0 - 15.0 g/dL   HCT 36.7 36.0 - 46.0 %   MCV 99.2 80.0 - 100.0 fL   MCH 31.4 26.0 - 34.0 pg   MCHC 31.6 30.0 - 36.0 g/dL   RDW 21.3 (H) 11.5 - 15.5 %   Platelets 146 (L) 150 - 400 K/uL   nRBC 0.0 0.0 - 0.2 %    Comment: Performed at [facility], [place] [address]  [place], [place] [address]  Ammonia     Status: Abnormal   Collection Time: 11/16/20  9:08 AM  Result Value Ref Range   Ammonia 84 (H) 9 - 35 umol/L    Comment: Performed at [facility], [place] [address] [place], [place] [address]  APTT     Status: None   Collection Time: 11/16/20  9:08 AM  Result Value Ref Range   aPTT 26 24 - 36 seconds    Comment: Performed at [facility], [place] [address] [place], [place] [address]  Protime-INR     Status: None   Collection Time: 11/17/20  4:37 AM  Result Value Ref Range   Prothrombin Time 13.0 11.4 - 15.2 seconds   INR 1.0 0.8 - 1.2    Comment: (NOTE) INR goal varies based on device and disease states. Performed at [facility], [place] [address], [place], [place] [address]   Ammonia     Status: Abnormal   Collection Time: 11/17/20  4:37 AM  Result Value Ref Range   Ammonia 53 (H) 9 - 35 umol/L    Comment: Performed at [facility], [place] [address], [place],  [address]  Comprehensive metabolic panel     Status: Abnormal   Collection Time: 11/17/20  4:37 AM  Result Value Ref Range   Sodium  133 (L) 135 - 145 mmol/L   Potassium 3.1 (L) 3.5 - 5.1 mmol/L   Chloride 104 98 - 111 mmol/L   CO2 18 (L) 22 - 32 mmol/L   Glucose, Bld 97 70 - 99 mg/dL    Comment: Glucose reference range applies only to samples taken after fasting for at least 8 hours.   BUN <5 (L) 6 - 20 mg/dL   Creatinine, Ser 0.61 0.44 - 1.00 mg/dL   Calcium 8.5 (L) 8.9 - 10.3 mg/dL   Total Protein 5.9 (L) 6.5 - 8.1 g/dL   Albumin 2.7 (L) 3.5 - 5.0 g/dL   AST 88 (H) 15 - 41 U/L   ALT 20 0 - 44 U/L   Alkaline Phosphatase 95 38 - 126 U/L   Total Bilirubin 8.0 (H) 0.3 - 1.2 mg/dL   GFR, Estimated >60 >60 mL/min    Comment: (NOTE) Calculated using the CKD-EPI Creatinine Equation (2021)    Anion gap 11 5 - 15    Comment: Performed at [facility], [place] [address], [place], [place]  [address]  CK     Status: Abnormal   Collection Time: 11/17/20  4:37 AM  Result Value Ref Range   Total CK 330 (H) 38 - 234 U/L    Comment: Performed at [facility], [place] [address] [place], [place] [address]  Magnesium     Status: Abnormal   Collection Time: 11/17/20  4:37 AM  Result Value Ref Range   Magnesium 1.5 (L) 1.7 - 2.4 mg/dL    Comment: Performed at [facility], [place] [address], [place], [place] [address]  CBC     Status: Abnormal   Collection Time: 11/17/20  4:37 AM  Result Value Ref Range   WBC 5.9 4.0 - 10.5 K/uL   RBC 3.29 (L) 3.87 - 5.11 MIL/uL   Hemoglobin 10.5 (L) 12.0 - 15.0 g/dL   HCT 32.7 (L) 36.0 - 46.0 %   MCV 99.4 80.0 - 100.0 fL   MCH 31.9 26.0 - 34.0 pg   MCHC 32.1 30.0 - 36.0 g/dL   RDW 21.2 (H) 11.5 - 15.5 %   Platelets 141 (L) 150 - 400 K/uL    Comment: CONSISTENT WITH PREVIOUS RESULT REPEATED TO VERIFY    nRBC 0.3 (H) 0.0 - 0.2 %    Comment: Performed at [facility], [place] [address], [place], [place] [address]   US ABDOMEN LIMITED RUQ (LIVER/GB)  Result Date: 11/15/2020 CLINICAL DATA:  Right upper quadrant pain for 1 week. EXAM: ULTRASOUND ABDOMEN LIMITED RIGHT UPPER QUADRANT COMPARISON:  July 22, 2020 FINDINGS: Gallbladder: The gallbladder is distended to 5.4 cm. No gallstones or wall thickening visualized. No sonographic Murphy sign noted by sonographer. Common bile duct: Diameter: 5 mm Liver: No focal lesion identified. Increased coarse parenchymal echogenicity. Portal vein is patent on color Doppler imaging with normal direction of blood flow towards the liver. Other: None. IMPRESSION: No evidence of acute cholecystitis. Unchanged gallbladder hydrops. Increased coarse echotexture of the liver, usually associated with hepatic steatosis or fibrosis. Electronically Signed   By: Fidela Salisbury M.D.   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 No definite gallstones on imaging. Will obtain HIDA scan for further evaluation.   ID - none VTE - SCDs, per primary FEN - CLD Foley - none Follow up - TBD  [place], [facility] Surgery 11/17/2020, 12:14 PM Please see Amion for pager number during day hours 7:00am-4:30pm

## 2020-11-17 NOTE — Plan of Care (Signed)
  Problem: Education: Goal: Knowledge of General Education information will improve Description Including pain rating scale, medication(s)/side effects and non-pharmacologic comfort measures Outcome: Progressing   

## 2020-11-17 NOTE — Progress Notes (Signed)
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  Patient had extensive work-up for acute hepatitis in 07/2020.  She was seen by [facility] a pathologist outpatient, and completed steroid course.   In ED, hemodynamically stable.  K2.2.  AST 147.  Total bili 9.4 (lower than prior).  Ammonia 125.  CK 537.   The next day, GI consulted.  RUQ [place] negative for acute cholecystitis but unchanged gallbladder hydrops and increased coarse echotexture of the liver concerning for hepatic steatosis or fibrosis.  GI recommended general surgery recommendation for possible cholecystectomy.  Discussed with general surgery, Dr. Donne Hazel who didn't feel cholecystectomy is indicated.  Her TSH was also elevated to 43.   Subjective: Seen and examined earlier this morning.  No major events overnight or this morning.  Reports having a rough night due to lactulose.  Also continues to endorse RUQ pain.  She rates her pain 8/10.  Pain radiates to her back.  She also reports nausea but no emesis.  Objective: Vitals:   11/16/20 0515 11/16/20 1337 11/16/20 2226 11/17/20 0505  BP: (!) 161/91 (!) 145/82 (!) 150/84 136/77  Pulse: 82 93 92 88  Resp: 16 17 16 18   Temp: 97.8 F (36.6 C) 98 F (36.7 C) 98.4 F (36.9 C) 98 F (36.7 C)  TempSrc: Oral  Oral   SpO2: 99% 99% 98% 99%  Weight:      Height:        Intake/Output Summary (Last 24 hours) at 11/17/2020 1158 Last data filed at 11/17/2020 1125 Gross  per 24 hour  Intake 2340 ml  Output 0 ml  Net 2340 ml   Filed Weights   11/14/20 1440 11/14/20 2250  Weight: 68 kg 73.2 kg    Examination:  GENERAL: No apparent distress.  Nontoxic. HEENT: MMM.  Vision and hearing grossly intact.  NECK: Supple.  No apparent JVD.  RESP:  No IWOB.  Fair aeration bilaterally. CVS:  RRR. Heart sounds normal.  ABD/GI/GU: BS+. Abd soft.  RUQ tenderness. MSK/EXT:  Moves extremities. No apparent deformity. No edema.  SKIN: Jaundice in her face. NEURO: Awake, alert and oriented appropriately.  No apparent focal neuro deficit. PSYCH: Calm. Normal affect.  Procedures:  None  Microbiology summarized: [id number] and influenza PCR nonreactive.  Assessment & Plan: Intactable nausea and vomiting-due to cholestasis?  CT abdomen and pelvis with hepatic steatosis, mild hepatomegaly, unchanged gallbladder hydrops on wall thickening of mid stomach.  RUQ ultrasound negative for acute cholecystitis but unchanged gallbladder hydrops and possible hepatic steatosis/fibrosis.  Patient continues to endorse significant pain and nausea. -GI consulted general surgery recommendation for possible cholecystectomy -Case discussed with general surgery, Dr. Donne Hazel on 4/10.  He didn't feel cholecystectomy is indicated. -Again discussed with general surgery, Dr. Rosendo Gros on 4/11.  He recommended HIDA scan to look at [facility]. -HIDA scan ordered. -Continue antiemetics and pain medications with IV fluid -Continue clear liquid diet  Acute hepatic encephalopathy: had some forgetfulness on presentation.  Ammonia 125>>> 84> 53.  Improving. -Continue lactulose 20 g 3 times daily.  Adjust for goal of 2-3  bowel movements a day. -Continue monitoring  Subacute hepatitis/cholestasis-had extensive but unrevealing work-up recently. Elevated AST-could be due to rhabdo or alcohol.  Improving. -Appreciate help by GI -Continue home Ursodil -Cholestyramine for pruritus.  Coagulopathy-likely due to  subacute hepatitis.  Resolved.  Hyponatremia: Stable. -Monitor  Hypokalemia/hypomagnesemia/hypocalcemia: K3.1.  Mg 1.5. -P.o. K-Dur 40 mEq x 2 -IV magnesium sulfate 4 g x 1  Anion gap metabolic acidosis: Improving.  Anion gap closing. -Continue sodium bicarbonate  Prolonged QT: Improved.  QTc 450.  Likely due to electrolyte arrangement -Minimize/avoid QT prolonging drugs -Optimize electrolytes  Hyperbilirubinemia: Stable at 8.0, which is better than prior values. Likely due to underlying cholestasis.   -Continue monitoring  Elevated AST: Likely due to rhabdo, & possible EtOH.  Reports "not more than half cup of vodka".  Improved. -Encouraged to avoid alcohol altogether given underlying liver condition.  Mild nontraumatic rhabdomyolysis: CK 456>> 330. -Continue monitoring -Continue IV fluids  Conjunctivitis: Does not look bacteria on my exam.  -Continue erythromycin ointment q6hrs  Essential hypertension: Normotensive this morning -Continue amlodipine  Hypothyroidism: TSH 43.  Reports not taking his Synthroid for 3 weeks due to "nausea". -Increased levothyroxine to 50 mcg daily on 4/9.  Depression: Stable -Continue home medications  Thrombocytopenia: Likely due to liver disease and alcohol.  Improving. Recent Labs  Lab 11/14/20 1525 11/15/20 0541 11/16/20 0908 11/17/20 0437  PLT 171 133* 146* 141*    Debility/physical deconditioning -PT/OT eval  Body mass index is 26.05 kg/m. Nutrition Problem: Inadequate oral intake Etiology: acute illness,nausea,vomiting Signs/Symptoms: per patient/family report Interventions: Boost Breeze,MVI,Prostat   DVT prophylaxis:  SCDs Start: 11/14/20 2136  Code Status: Full code Family Communication: Patient and/or RN. Available if any question.  Level of care: Med-Surg Status is: Inpatient  Remains inpatient appropriate because:Persistent severe electrolyte disturbances, Ongoing diagnostic testing needed not  appropriate for outpatient work up, IV treatments appropriate due to intensity of illness or inability to take PO and Inpatient level of care appropriate due to severity of illness   Dispo: The patient is from: Home              Anticipated d/c is to: Home              Patient currently is not medically stable to d/c.   Difficult to place patient No       Consultants:  Gastroenterology General surgery over the phone    Sch Meds:  Scheduled Meds: . (feeding supplement) PROSource Plus  30 mL Oral TID BM  . amLODipine  10 mg Oral Daily  . cholestyramine light  4 g Oral Q12H  . erythromycin   Both Eyes Q6H  . feeding supplement  1 Container Oral TID BM  . lactulose  20 g Oral TID  . levothyroxine  50 mcg Oral Q0600  . multivitamin with minerals  1 tablet Oral Daily  . potassium chloride  40 mEq Oral Q4H  . sodium bicarbonate  650 mg Oral TID   Continuous Infusions: . sodium chloride Stopped (11/15/20 0300)  . 0.9 % NaCl with KCl 40 mEq / L 75 mL/hr at 11/17/20 0953  . magnesium sulfate bolus IVPB 4 g (11/17/20 1125)   PRN Meds:.sodium chloride, LORazepam, morphine injection  Antimicrobials: Anti-infectives (From admission, onward)   None       I have personally reviewed the following labs and images: CBC: Recent Labs  Lab 11/14/20 1525 11/15/20 0541 11/16/20 0908 11/17/20 0437  WBC 7.9 6.2 7.6 5.9  HGB 13.4  12.2 11.6* 10.5*  HCT 41.0 37.5 36.7 32.7*  MCV 94.9 95.7 99.2 99.4  PLT 171 133* 146* 141*   BMP &GFR Recent Labs  Lab 11/14/20 1525 11/14/20 1626 11/15/20 0541 11/15/20 1444 11/16/20 0244 11/17/20 0437  NA 132*  --  134* 134* 133* 133*  K 2.2*  --  2.3* 3.8 4.3 3.1*  CL 93*  --  101 103 101 104  CO2 18*  --  19* 17* 16* 18*  GLUCOSE 95  --  109* 125* 101* 97  BUN 6  --  <5* <5* <5* <5*  CREATININE 0.88  --  0.77 0.81 0.47 0.61  CALCIUM 8.1*  --  7.4* 8.3* 8.0* 8.5*  MG  --  1.3* 1.7  --   --  1.5*   Estimated Creatinine Clearance: 78.5  mL/min (by C-G formula based on SCr of 0.61 mg/dL). Liver & Pancreas: Recent Labs  Lab 11/14/20 1525 11/15/20 0541 11/15/20 1444 11/16/20 0244 11/17/20 0437  AST 147* 131* 124* 118* 88*  ALT 22 19 22 19 20   ALKPHOS 108 93 92 99 95  BILITOT 9.4* 8.0*  8.1* 8.1* 8.3* 8.0*  PROT 7.4 6.4* 6.1* 6.3* 5.9*  ALBUMIN 3.5 3.0* 3.0* 3.1* 2.7*   Recent Labs  Lab 11/14/20 1525  LIPASE 99*   Recent Labs  Lab 11/14/20 1626 11/15/20 0541 11/16/20 0908 11/17/20 0437  AMMONIA 125* 87* 84* 53*   Diabetic: No results for input(s): HGBA1C in the last 72 hours. Recent Labs  Lab 11/14/20 1527  GLUCAP 86   Cardiac Enzymes: Recent Labs  Lab 11/14/20 1525 11/15/20 0541 11/16/20 0244 11/17/20 0437  CKTOTAL 537* 428* 456* 330*   No results for input(s): PROBNP in the last 8760 hours. Coagulation Profile: Recent Labs  Lab 11/14/20 1525 11/16/20 0244 11/17/20 0437  INR 1.2 2.6* 1.0   Thyroid Function Tests: Recent Labs    11/15/20 0831  TSH 42.829*   Lipid Profile: No results for input(s): CHOL, HDL, LDLCALC, TRIG, CHOLHDL, LDLDIRECT in the last 72 hours. Anemia Panel: No results for input(s): VITAMINB12, FOLATE, FERRITIN, TIBC, IRON, RETICCTPCT in the last 72 hours. Urine analysis:    Component Value Date/Time   COLORURINE AMBER (A) 11/14/2020 2040   APPEARANCEUR CLEAR 11/14/2020 2040   LABSPEC 1.027 11/14/2020 2040   PHURINE 8.0 11/14/2020 2040   GLUCOSEU NEGATIVE 11/14/2020 2040   HGBUR SMALL (A) 11/14/2020 2040   BILIRUBINUR NEGATIVE 11/14/2020 2040   KETONESUR NEGATIVE 11/14/2020 2040   PROTEINUR NEGATIVE 11/14/2020 2040   NITRITE NEGATIVE 11/14/2020 2040   LEUKOCYTESUR NEGATIVE 11/14/2020 2040   Sepsis Labs: Invalid input(s): PROCALCITONIN, [place]  Microbiology: Recent Results (from the past 240 hour(s))  Resp Panel by RT-PCR (Flu A&B, Covid) Nasopharyngeal Swab     Status: None   Collection Time: 11/14/20  6:41 PM   Specimen: Nasopharyngeal Swab;  Nasopharyngeal(NP) swabs in vial transport medium  Result Value Ref Range Status   SARS Coronavirus 2 by RT PCR NEGATIVE NEGATIVE Final    Comment: (NOTE) SARS-CoV-2 target nucleic acids are NOT DETECTED.  The SARS-CoV-2 RNA is generally detectable in upper respiratory specimens during the acute phase of infection. The lowest concentration of SARS-CoV-2 viral copies this assay can detect is 138 copies/mL. A negative result does not preclude SARS-Cov-2 infection and should not be used as the sole basis for treatment or other patient management decisions. A negative result may occur with  improper specimen collection/handling, submission of specimen other than nasopharyngeal swab, presence of  viral mutation(s) within the areas targeted by this assay, and inadequate number of viral copies(<138 copies/mL). A negative result must be combined with clinical observations, patient history, and epidemiological information. The expected result is Negative.  Fact Sheet for Patients:  EntrepreneurPulse.com.au  Fact Sheet for Healthcare Providers:  IncredibleEmployment.be  This test is no t yet approved or cleared by the [place] FDA and  has been authorized for detection and/or diagnosis of SARS-CoV-2 by FDA under an Emergency Use Authorization (EUA). This EUA will remain  in effect (meaning this test can be used) for the duration of the COVID-19 declaration under Section 564(b)(1) of the Act, 21 U.S.C.section 360bbb-3(b)(1), unless the authorization is terminated  or revoked sooner.       Influenza A by PCR NEGATIVE NEGATIVE Final   Influenza B by PCR NEGATIVE NEGATIVE Final    Comment: (NOTE) The Xpert Xpress SARS-CoV-2/FLU/RSV plus assay is intended as an aid in the diagnosis of influenza from Nasopharyngeal swab specimens and should not be used as a sole basis for treatment. Nasal washings and aspirates are unacceptable for Xpert Xpress  SARS-CoV-2/FLU/RSV testing.  Fact Sheet for Patients: EntrepreneurPulse.com.au  Fact Sheet for Healthcare Providers: IncredibleEmployment.be  This test is not yet approved or cleared by the [place] FDA and has been authorized for detection and/or diagnosis of SARS-CoV-2 by FDA under an Emergency Use Authorization (EUA). This EUA will remain in effect (meaning this test can be used) for the duration of the COVID-19 declaration under Section 564(b)(1) of the Act, 21 U.S.C. section 360bbb-3(b)(1), unless the authorization is terminated or revoked.  Performed at [facility], [place] [address] [place], [place] [address]     Radiology Studies: No results found.   Kiyara Bouffard T. Selby  If 7PM-7AM, please contact night-coverage [email or web address] 11/17/2020, 11:58 AM

## 2020-11-18 ENCOUNTER — Inpatient Hospital Stay (HOSPITAL_COMMUNITY): Payer: Medicare PPO

## 2020-11-18 LAB — COMPREHENSIVE METABOLIC PANEL
ALT: 19 U/L (ref 0–44)
AST: 106 U/L — ABNORMAL HIGH (ref 15–41)
Albumin: 2.5 g/dL — ABNORMAL LOW (ref 3.5–5.0)
Alkaline Phosphatase: 91 U/L (ref 38–126)
Anion gap: 9 (ref 5–15)
BUN: <5 mg/dL — ABNORMAL LOW (ref 6–20)
CO2: 16 mmol/L — ABNORMAL LOW (ref 22–32)
Calcium: 8 mg/dL — ABNORMAL LOW (ref 8.9–10.3)
Chloride: 109 mmol/L (ref 98–111)
Creatinine, Ser: 0.51 mg/dL (ref 0.44–1.00)
GFR, Estimated: >60 mL/min (ref >60)
Glucose, Bld: 112 mg/dL — ABNORMAL HIGH (ref 70–99)
Potassium: 4.3 mmol/L (ref 3.5–5.1)
Sodium: 134 mmol/L — ABNORMAL LOW (ref 135–145)
Total Bilirubin: 11 mg/dL — ABNORMAL HIGH (ref 0.3–1.2)
Total Protein: 5.3 g/dL — ABNORMAL LOW (ref 6.5–8.1)

## 2020-11-18 LAB — CBC
HCT: 33.4 % — ABNORMAL LOW (ref 36.0–46.0)
Hemoglobin: 10.5 g/dL — ABNORMAL LOW (ref 12.0–15.0)
MCH: 32.2 pg (ref 26.0–34.0)
MCHC: 31.4 g/dL (ref 30.0–36.0)
MCV: 102.5 fL — ABNORMAL HIGH (ref 80.0–100.0)
Platelets: 133 10*3/uL — ABNORMAL LOW (ref 150–400)
RBC: 3.26 MIL/uL — ABNORMAL LOW (ref 3.87–5.11)
RDW: 22.7 % — ABNORMAL HIGH (ref 11.5–15.5)
WBC: 6.9 10*3/uL (ref 4.0–10.5)
nRBC: 0 % (ref 0.0–0.2)

## 2020-11-18 LAB — AMMONIA: Ammonia: 54 umol/L — ABNORMAL HIGH (ref 9–35)

## 2020-11-18 LAB — CK: Total CK: 229 U/L (ref 38–234)

## 2020-11-18 LAB — MAGNESIUM: Magnesium: 1.9 mg/dL (ref 1.7–2.4)

## 2020-11-18 IMAGING — NM NM HEPATO W/GB/PHARM/[PERSON_NAME]
2 series · 12 of 12 positions shown · non-contrast
Comparison: Right upper quadrant abdominal ultrasound-[DATE];
CT abdomen pelvis-[DATE]

CLINICAL DATA: Right upper quadrant abdominal pain. Evaluate for
acute cholecystitis versus biliary dyskinesia.

EXAM:
NUCLEAR MEDICINE HEPATOBILIARY IMAGING WITH GALLBLADDER EF
TECHNIQUE: Sequential images of the abdomen were obtained [DATE] minutes
following intravenous administration of radiopharmaceutical. After
oral ingestion of Ensure, gallbladder ejection fraction was
determined. At 60 min, normal ejection fraction is greater than 33%.
RADIOPHARMACEUTICALS:  7.7 mCi [YH]  Choletec IV

[Series 1: biliary · 3.25mm/px · 6 of 60 frames shown]
[frame 6/60]
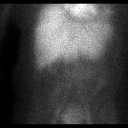
[frame 16/60]
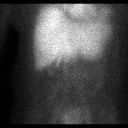
[frame 26/60]
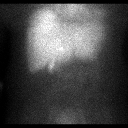
[frame 36/60]
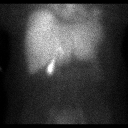
[frame 46/60]
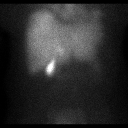
[frame 56/60]
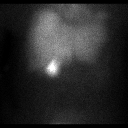

[Series 2: gbef · 3.25mm/px · 6 of 60 frames shown]
[frame 6/60]
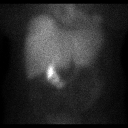
[frame 16/60]
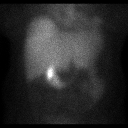
[frame 26/60]
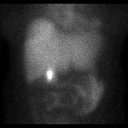
[frame 36/60]
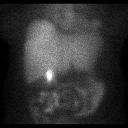
[frame 46/60]
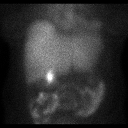
[frame 56/60]
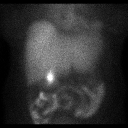

[12 of 12 positions shown; findings below may reference images not displayed]

FINDINGS: There is homogeneous distribution injected radiotracer throughout
the hepatic parenchyma. There is early excretion of radiotracer with
opacification of the gallbladder lumen, initially seen on the 25-30
minute anterior projection planar image.

Following ingestion of the Ensure, there is no significant emptying
of the gallbladder, however note, a gallbladder ejection fraction
could not be estimated given location of the gallbladder located in
close proximity to adjacent loops of small bowel, however
morphologically there is no significant emptying of the gallbladder.

Patient denied elicitation of abdominal pain following the ingestion
of the Ensure.
IMPRESSION: 1. No scintigraphic evidence of acute cholecystitis.
2. Findings suggestive of biliary dyskinesia with no significant
emptying of the gallbladder following ingestion of the Ensure.

## 2020-11-18 MED ORDER — TECHNETIUM TC 99M MEBROFENIN IV KIT
7.7000 | PACK | Freq: Once | INTRAVENOUS | Status: AC | PRN
  Administered 2020-11-18: 7.7 via INTRAVENOUS

## 2020-11-18 NOTE — Plan of Care (Signed)

## 2020-11-18 NOTE — Progress Notes (Signed)
Judy Meyer 12:44 PM  Subjective: Patient seen and examined and doing okay today and wants to try to eat some soft solids and her pain is better and her case discussed with nuclear medicine and she has no new complaints  Objective: Vital signs stable afebrile no acute distress exam pertinent for abdomen being slightly firm but nontender no guarding or rebound CBC stable slight increase liver test HIDA scan compatible with biliary dyskinesia  Assessment: Right upper quadrant pain questionable gallbladder and patient with chronic liver dysfunction questionable etiology  Plan: I think a consideration of lap chole with repeat liver biopsy and Intra-Op cholangiogram should be considered otherwise I do not have any further GI work-up at this time and wait on surgical opinion otherwise she can follow-up with her liver specialist for ongoing monitoring and care  [facility]  office [phone] After 5PM or if no answer call [phone]

## 2020-11-18 NOTE — Progress Notes (Signed)
Report given to [facility] in Nuclear Med. Patient confirmed no narcotics after Midnight and maintained NPO per MD order. IVF's infusing without difficulty, see MAR.

## 2020-11-18 NOTE — Progress Notes (Signed)
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  Patient had extensive work-up for acute hepatitis in 07/2020.  She was seen by [facility] a pathologist outpatient, and completed steroid course.   In ED, hemodynamically stable.  K2.2.  AST 147.  Total bili 9.4 (lower than prior).  Ammonia 125.  CK 537.   The next day, GI consulted.  RUQ [place] negative for acute cholecystitis but unchanged gallbladder hydrops and increased coarse echotexture of the liver concerning for hepatic steatosis or fibrosis.  GI recommended general surgery recommendation for possible cholecystectomy.  HIDA scan ordered per recommendation by general surgery and showed biliary dyskinesia but no cholecystitis.  Her TSH was also elevated to 43.   Subjective: Seen and examined this afternoon after she returned from HIDA scan.  Reports having a rough night due to multiple bowel movements from lactulose.  Continues to endorse RUQ pain.  She rates her pain 9/10 before IV morphine.  Pain improved to 5/10 after IV morphine.  Continues to endorse nausea but no emesis.  Denies chest pain or dyspnea.  Objective: Vitals:   11/17/20 2002 11/18/20 0451 11/18/20 0451 11/18/20 1349  BP: 133/75 132/69 132/69 109/69  Pulse: 92 94 93 92  Resp: 16 16 16 18   Temp: 98.2 F (36.8 C) 98.9 F (37.2 C) 98.9 F (37.2 C) 99.4 F (37.4 C)  TempSrc: Oral Oral Oral Oral  SpO2: 98% 99% 99% 100%  Weight:      Height:         Intake/Output Summary (Last 24 hours) at 11/18/2020 1356 Last data filed at 11/18/2020 1300 Gross per 24 hour  Intake 2892 ml  Output 0 ml  Net 2892 ml   Filed Weights   11/14/20 1440 11/14/20 2250  Weight: 68 kg 73.2 kg    Examination:  GENERAL: No apparent distress.  Nontoxic. HEENT: MMM.  Vision and hearing grossly intact.  NECK: Supple.  No apparent JVD.  RESP:  No IWOB.  Fair aeration bilaterally. CVS:  RRR. Heart sounds normal.  ABD/GI/GU: BS+. Abd soft.  RUQ tenderness. MSK/EXT:  Moves extremities. No apparent deformity. No edema.  SKIN: no apparent skin lesion or wound NEURO: Awake, alert and oriented appropriately.  No apparent focal neuro deficit. PSYCH: Calm. Normal affect.   Procedures:  None  Microbiology summarized: NFAOZ-30 and influenza PCR nonreactive.  Assessment & Plan: Intactable nausea and vomiting-due to cholestasis?  CT abdomen and pelvis with hepatic steatosis, mild hepatomegaly, unchanged gallbladder hydrops on wall thickening of mid stomach.  RUQ [place] negative for acute cholecystitis but unchanged gallbladder hydrops and possible hepatic steatosis/fibrosis.  HIDA scan with biliary dyskinesia but no cholecystitis.  Patient continues to endorse significant pain and nausea but no emesis since admission. -Appreciate GI recommendation-lap chole with repeat liver biopsy and intraoperative cholangiogram -Appreciate help and guidance by general surgery -Continue antiemetics and pain medications with IV fluid -Continue clear liquid diet pending formal input from general surgery  Acute hepatic encephalopathy: had some forgetfulness on presentation.  Ammonia 125>>> 84> 53.  Improving. -Continue lactulose 20 g 3 times  daily.  Adjust for goal of 2-3 bowel movements a day. -Continue monitoring  Subacute hepatitis/cholestasis-had extensive but unrevealing work-up recently. Hyperbilirubinemia: Bilirubin trended from 8.0-11 today. Elevated AST-could be due to  rhabdo or alcohol.  Improving. -GI and general surgery recommendations as above -Continue home Ursodil -Cholestyramine for pruritus.  Coagulopathy-likely due to subacute hepatitis.  Resolved.  Hyponatremia: Stable. -Monitor  Hypokalemia/hypomagnesemia/hypocalcemia: Resolved.  Anion gap metabolic acidosis: Anion gap closed -Continue sodium bicarbonate  Prolonged QT: Improved.  QTc 450.  Likely due to electrolyte arrangement -Minimize/avoid QT prolonging drugs -Optimize electrolytes  Mild nontraumatic rhabdomyolysis: CK 456>> 330>> 229.  Resolved.  Conjunctivitis: Does not look bacteria on my exam.  Resolved. -Discontinue eyedrop  Essential hypertension: Normotensive this morning -Continue amlodipine  Hypothyroidism: TSH 43.  Reports not taking his Synthroid for 3 weeks due to "nausea". -Increased levothyroxine to 50 mcg daily on 4/9.  Depression: Stable -Continue home medications  Thrombocytopenia: Likely due to liver disease and alcohol. Recent Labs  Lab 11/14/20 1525 11/15/20 0541 11/16/20 0908 11/17/20 0437 11/18/20 1112  PLT 171 133* 146* 141* 133*  -Continue monitoring  Debility/physical deconditioning -PT/OT eval  Body mass index is 26.05 kg/m. Nutrition Problem: Inadequate oral intake Etiology: acute illness,nausea,vomiting Signs/Symptoms: per patient/family report Interventions: Boost Breeze,MVI,Prostat   DVT prophylaxis:  SCDs Start: 11/14/20 2136  Code Status: Full code Family Communication: Patient and/or RN. Available if any question.  Level of care: Med-Surg Status is: Inpatient  Remains inpatient appropriate because:Persistent severe electrolyte disturbances, Ongoing diagnostic testing needed not appropriate for outpatient work up, IV treatments appropriate due to intensity of illness or inability to take PO and Inpatient level of care appropriate due to severity of illness   Dispo: The patient is from: Home              Anticipated  d/c is to: Home              Patient currently is not medically stable to d/c.   Difficult to place patient No       Consultants:  Gastroenterology General surgery    Sch Meds:  Scheduled Meds: . (feeding supplement) PROSource Plus  30 mL Oral TID BM  . amLODipine  10 mg Oral Daily  . cholestyramine light  4 g Oral Q12H  . erythromycin   Both Eyes Q6H  . feeding supplement  1 Container Oral TID BM  . lactulose  20 g Oral TID  . levothyroxine  50 mcg Oral Q0600  . multivitamin with minerals  1 tablet Oral Daily  . sodium bicarbonate  650 mg Oral TID   Continuous Infusions: . sodium chloride Stopped (11/15/20 0300)  . 0.9 % NaCl with KCl 40 mEq / L 75 mL/hr at 11/18/20 1145   PRN Meds:.sodium chloride, albuterol, LORazepam, morphine injection  Antimicrobials: Anti-infectives (From admission, onward)   None       I have personally reviewed the following labs and images: CBC: Recent Labs  Lab 11/14/20 1525 11/15/20 0541 11/16/20 0908 11/17/20 0437 11/18/20 1112  WBC 7.9 6.2 7.6 5.9 6.9  HGB 13.4 12.2 11.6* 10.5* 10.5*  HCT 41.0 37.5 36.7 32.7* 33.4*  MCV 94.9 95.7 99.2 99.4 102.5*  PLT 171 133* 146* 141* 133*   BMP &GFR Recent Labs  Lab 11/14/20 1626 11/15/20 0541 11/15/20 1444 11/16/20 0244 11/17/20 0437 11/18/20 1112  NA  --  134* 134* 133* 133* 134*  K  --  2.3* 3.8 4.3 3.1* 4.3  CL  --  101  103 101 104 109  CO2  --  19* 17* 16* 18* 16*  GLUCOSE  --  109* 125* 101* 97 112*  BUN  --  <5* <5* <5* <5* <5*  CREATININE  --  0.77 0.81 0.47 0.61 0.51  CALCIUM  --  7.4* 8.3* 8.0* 8.5* 8.0*  MG 1.3* 1.7  --   --  1.5* 1.9   Estimated Creatinine Clearance: 78.5 mL/min (by C-G formula based on SCr of 0.51 mg/dL). Liver & Pancreas: Recent Labs  Lab 11/15/20 0541 11/15/20 1444 11/16/20 0244 11/17/20 0437 11/18/20 1112  AST 131* 124* 118* 88* 106*  ALT 19 22 19 20 19   ALKPHOS 93 92 99 95 91  BILITOT 8.0*  8.1* 8.1* 8.3* 8.0* 11.0*  PROT 6.4*  6.1* 6.3* 5.9* 5.3*  ALBUMIN 3.0* 3.0* 3.1* 2.7* 2.5*   Recent Labs  Lab 11/14/20 1525  LIPASE 99*   Recent Labs  Lab 11/14/20 1626 11/15/20 0541 11/16/20 0908 11/17/20 0437 11/18/20 1112  AMMONIA 125* 87* 84* 53* 54*   Diabetic: No results for input(s): HGBA1C in the last 72 hours. Recent Labs  Lab 11/14/20 1527  GLUCAP 86   Cardiac Enzymes: Recent Labs  Lab 11/14/20 1525 11/15/20 0541 11/16/20 0244 11/17/20 0437 11/18/20 1112  CKTOTAL 537* 428* 456* 330* 229   No results for input(s): PROBNP in the last 8760 hours. Coagulation Profile: Recent Labs  Lab 11/14/20 1525 11/16/20 0244 11/17/20 0437  INR 1.2 2.6* 1.0   Thyroid Function Tests: No results for input(s): TSH, T4TOTAL, FREET4, T3FREE, THYROIDAB in the last 72 hours. Lipid Profile: No results for input(s): CHOL, HDL, LDLCALC, TRIG, CHOLHDL, LDLDIRECT in the last 72 hours. Anemia Panel: No results for input(s): VITAMINB12, FOLATE, FERRITIN, TIBC, IRON, RETICCTPCT in the last 72 hours. Urine analysis:    Component Value Date/Time   COLORURINE AMBER (A) 11/14/2020 2040   APPEARANCEUR CLEAR 11/14/2020 2040   LABSPEC 1.027 11/14/2020 2040   PHURINE 8.0 11/14/2020 2040   GLUCOSEU NEGATIVE 11/14/2020 2040   HGBUR SMALL (A) 11/14/2020 2040   BILIRUBINUR NEGATIVE 11/14/2020 2040   KETONESUR NEGATIVE 11/14/2020 2040   PROTEINUR NEGATIVE 11/14/2020 2040   NITRITE NEGATIVE 11/14/2020 2040   LEUKOCYTESUR NEGATIVE 11/14/2020 2040   Sepsis Labs: Invalid input(s): PROCALCITONIN, [place]  Microbiology: Recent Results (from the past 240 hour(s))  Resp Panel by RT-PCR (Flu A&B, Covid) Nasopharyngeal Swab     Status: None   Collection Time: 11/14/20  6:41 PM   Specimen: Nasopharyngeal Swab; Nasopharyngeal(NP) swabs in vial transport medium  Result Value Ref Range Status   SARS Coronavirus 2 by RT PCR NEGATIVE NEGATIVE Final    Comment: (NOTE) SARS-CoV-2 target nucleic acids are NOT DETECTED.  The  SARS-CoV-2 RNA is generally detectable in upper respiratory specimens during the acute phase of infection. The lowest concentration of SARS-CoV-2 viral copies this assay can detect is 138 copies/mL. A negative result does not preclude SARS-Cov-2 infection and should not be used as the sole basis for treatment or other patient management decisions. A negative result may occur with  improper specimen collection/handling, submission of specimen other than nasopharyngeal swab, presence of viral mutation(s) within the areas targeted by this assay, and inadequate number of viral copies(<138 copies/mL). A negative result must be combined with clinical observations, patient history, and epidemiological information. The expected result is Negative.  Fact Sheet for Patients:  EntrepreneurPulse.com.au  Fact Sheet for Healthcare Providers:  IncredibleEmployment.be  This test is no t yet approved or cleared by  the [employer] and  has been authorized for detection and/or diagnosis of SARS-CoV-2 by FDA under an Emergency Use Authorization (EUA). This EUA will remain  in effect (meaning this test can be used) for the duration of the COVID-19 declaration under Section 564(b)(1) of the Act, 21 U.S.C.section 360bbb-3(b)(1), unless the authorization is terminated  or revoked sooner.       Influenza A by PCR NEGATIVE NEGATIVE Final   Influenza B by PCR NEGATIVE NEGATIVE Final    Comment: (NOTE) The Xpert Xpress SARS-CoV-2/FLU/RSV plus assay is intended as an aid in the diagnosis of influenza from Nasopharyngeal swab specimens and should not be used as a sole basis for treatment. Nasal washings and aspirates are unacceptable for Xpert Xpress SARS-CoV-2/FLU/RSV testing.  Fact Sheet for Patients: EntrepreneurPulse.com.au  Fact Sheet for Healthcare Providers: IncredibleEmployment.be  This test is not yet approved or  cleared by the [place] FDA and has been authorized for detection and/or diagnosis of SARS-CoV-2 by FDA under an Emergency Use Authorization (EUA). This EUA will remain in effect (meaning this test can be used) for the duration of the COVID-19 declaration under Section 564(b)(1) of the Act, 21 U.S.C. section 360bbb-3(b)(1), unless the authorization is terminated or revoked.  Performed at [facility], [place] [address] [place], [place] [address]     Radiology Studies: NM Hepato W/EF  Result Date: 11/18/2020 CLINICAL DATA:  Right upper quadrant abdominal pain. Evaluate for acute cholecystitis versus biliary dyskinesia. EXAM: NUCLEAR MEDICINE HEPATOBILIARY IMAGING WITH GALLBLADDER EF TECHNIQUE: Sequential images of the abdomen were obtained out to 60 minutes following intravenous administration of radiopharmaceutical. After oral ingestion of Ensure, gallbladder ejection fraction was determined. At 60 min, normal ejection fraction is greater than 33%. RADIOPHARMACEUTICALS:  7.7 mCi Tc-75m  Choletec IV COMPARISON:  Right upper quadrant abdominal ultrasound-11/15/2020; CT abdomen pelvis-11/14/2020 FINDINGS: There is homogeneous distribution injected radiotracer throughout the hepatic parenchyma. There is early excretion of radiotracer with opacification of the gallbladder lumen, initially seen on the 25-30 minute anterior projection planar image. Following ingestion of the Ensure, there is no significant emptying of the gallbladder, however note, a gallbladder ejection fraction could not be estimated given location of the gallbladder located in close proximity to adjacent loops of small bowel, however morphologically there is no significant emptying of the gallbladder. Patient denied elicitation of abdominal pain following the ingestion of the Ensure. IMPRESSION: 1. No scintigraphic evidence of acute cholecystitis. 2. Findings suggestive of biliary dyskinesia with no significant  emptying of the gallbladder following ingestion of the Ensure. Electronically Signed   By: Sandi Mariscal M.D.   On: 11/18/2020 12:07     Neiva Maenza T. [place]  If 7PM-7AM, please contact night-coverage [email or web address] 11/18/2020, 1:56 PM

## 2020-11-19 ENCOUNTER — Inpatient Hospital Stay (HOSPITAL_COMMUNITY): Payer: Medicare PPO

## 2020-11-19 DIAGNOSIS — M545 Low back pain, unspecified: Secondary | ICD-10-CM

## 2020-11-19 DIAGNOSIS — W19XXXA Unspecified fall, initial encounter: Secondary | ICD-10-CM

## 2020-11-19 LAB — HEPATIC FUNCTION PANEL
ALT: 20 U/L (ref 0–44)
AST: 95 U/L — ABNORMAL HIGH (ref 15–41)
Albumin: 2.3 g/dL — ABNORMAL LOW (ref 3.5–5.0)
Alkaline Phosphatase: 86 U/L (ref 38–126)
Bilirubin, Direct: 6.2 mg/dL — ABNORMAL HIGH (ref 0.0–0.2)
Indirect Bilirubin: 3.6 mg/dL — ABNORMAL HIGH (ref 0.3–0.9)
Total Bilirubin: 9.8 mg/dL — ABNORMAL HIGH (ref 0.3–1.2)
Total Protein: 5 g/dL — ABNORMAL LOW (ref 6.5–8.1)

## 2020-11-19 LAB — POTASSIUM: Potassium: 3.4 mmol/L — ABNORMAL LOW (ref 3.5–5.1)

## 2020-11-19 LAB — PROTIME-INR
INR: 1.2 (ref 0.8–1.2)
Prothrombin Time: 14.8 seconds (ref 11.4–15.2)

## 2020-11-19 LAB — MAGNESIUM: Magnesium: 1.5 mg/dL — ABNORMAL LOW (ref 1.7–2.4)

## 2020-11-19 LAB — AMMONIA: Ammonia: 41 umol/L — ABNORMAL HIGH (ref 9–35)

## 2020-11-19 IMAGING — DX DG LUMBAR SPINE 2-3V
3 series · 3 of 3 positions shown · non-contrast
Comparison: [DATE]

CLINICAL DATA: Low back pain following fall, initial encounter

EXAM:
LUMBAR SPINE - 3 VIEW

[l-spine ap]
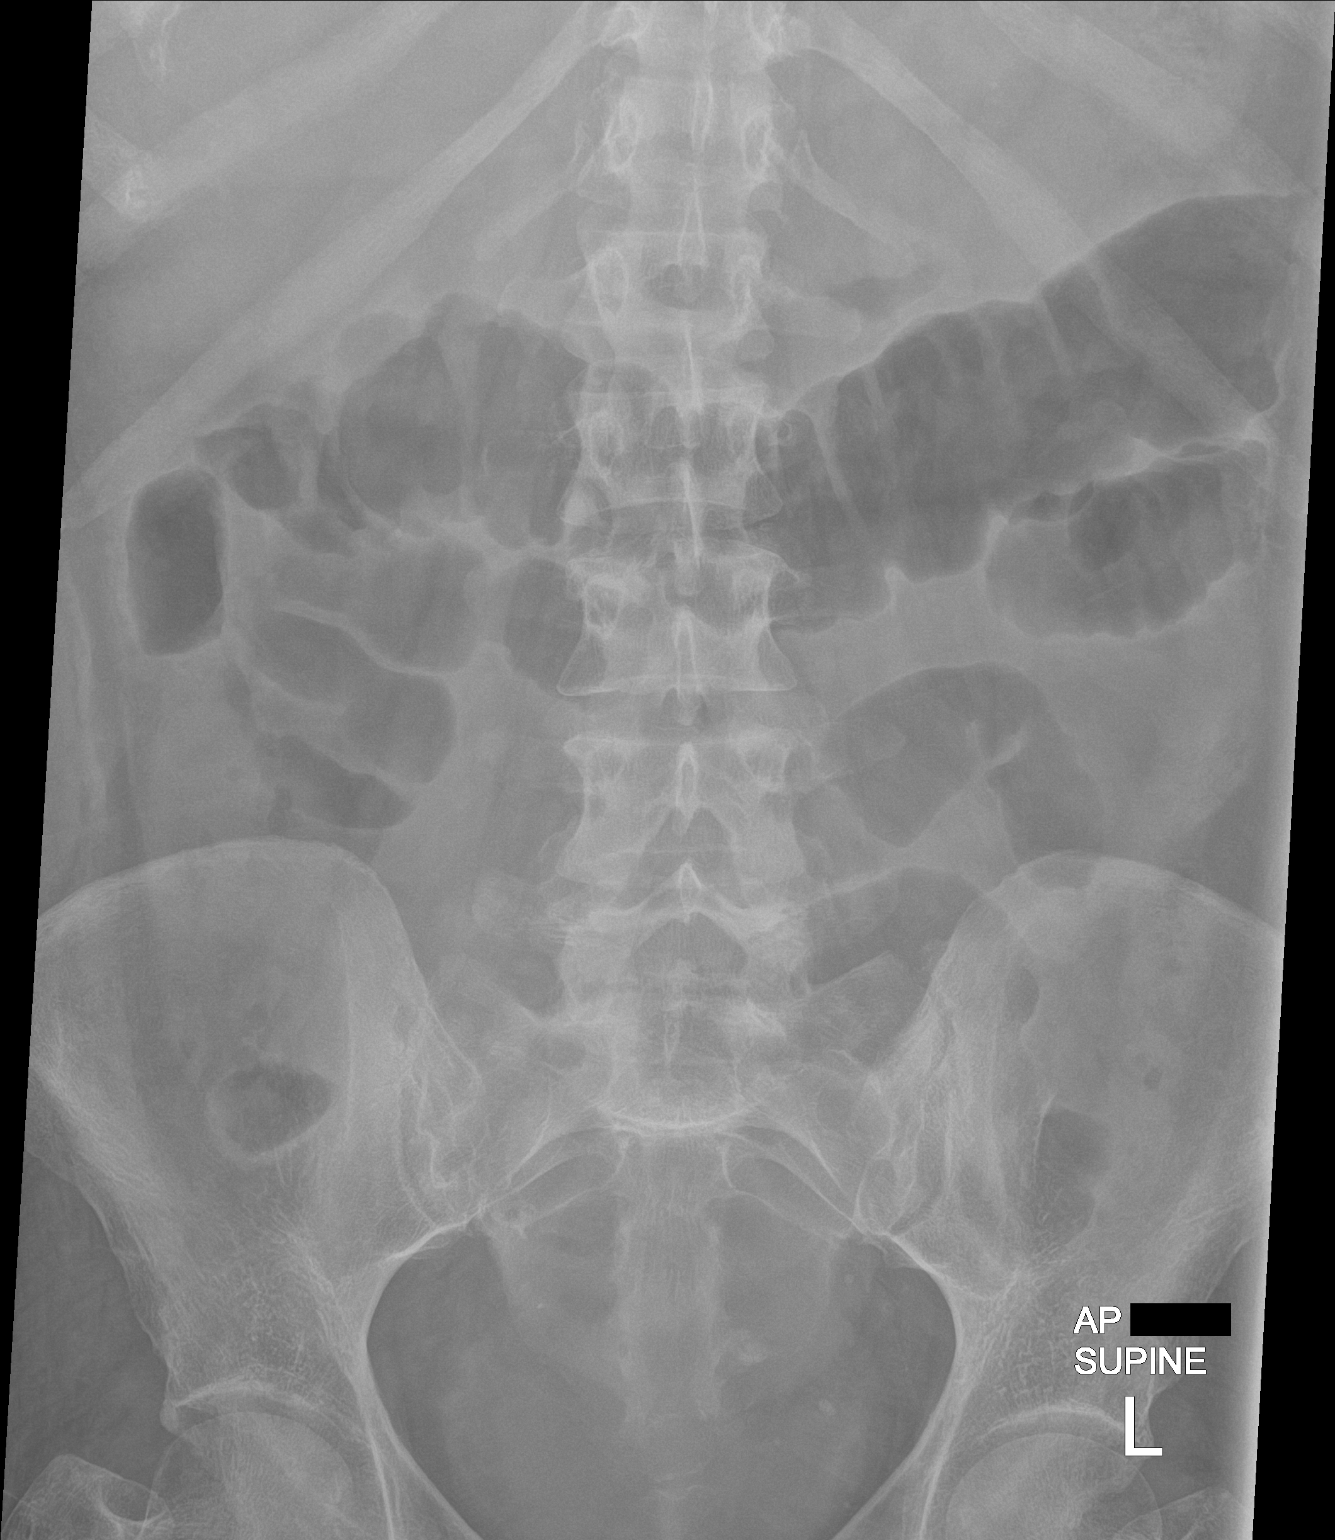

[l-spine lat (1 of 2)]
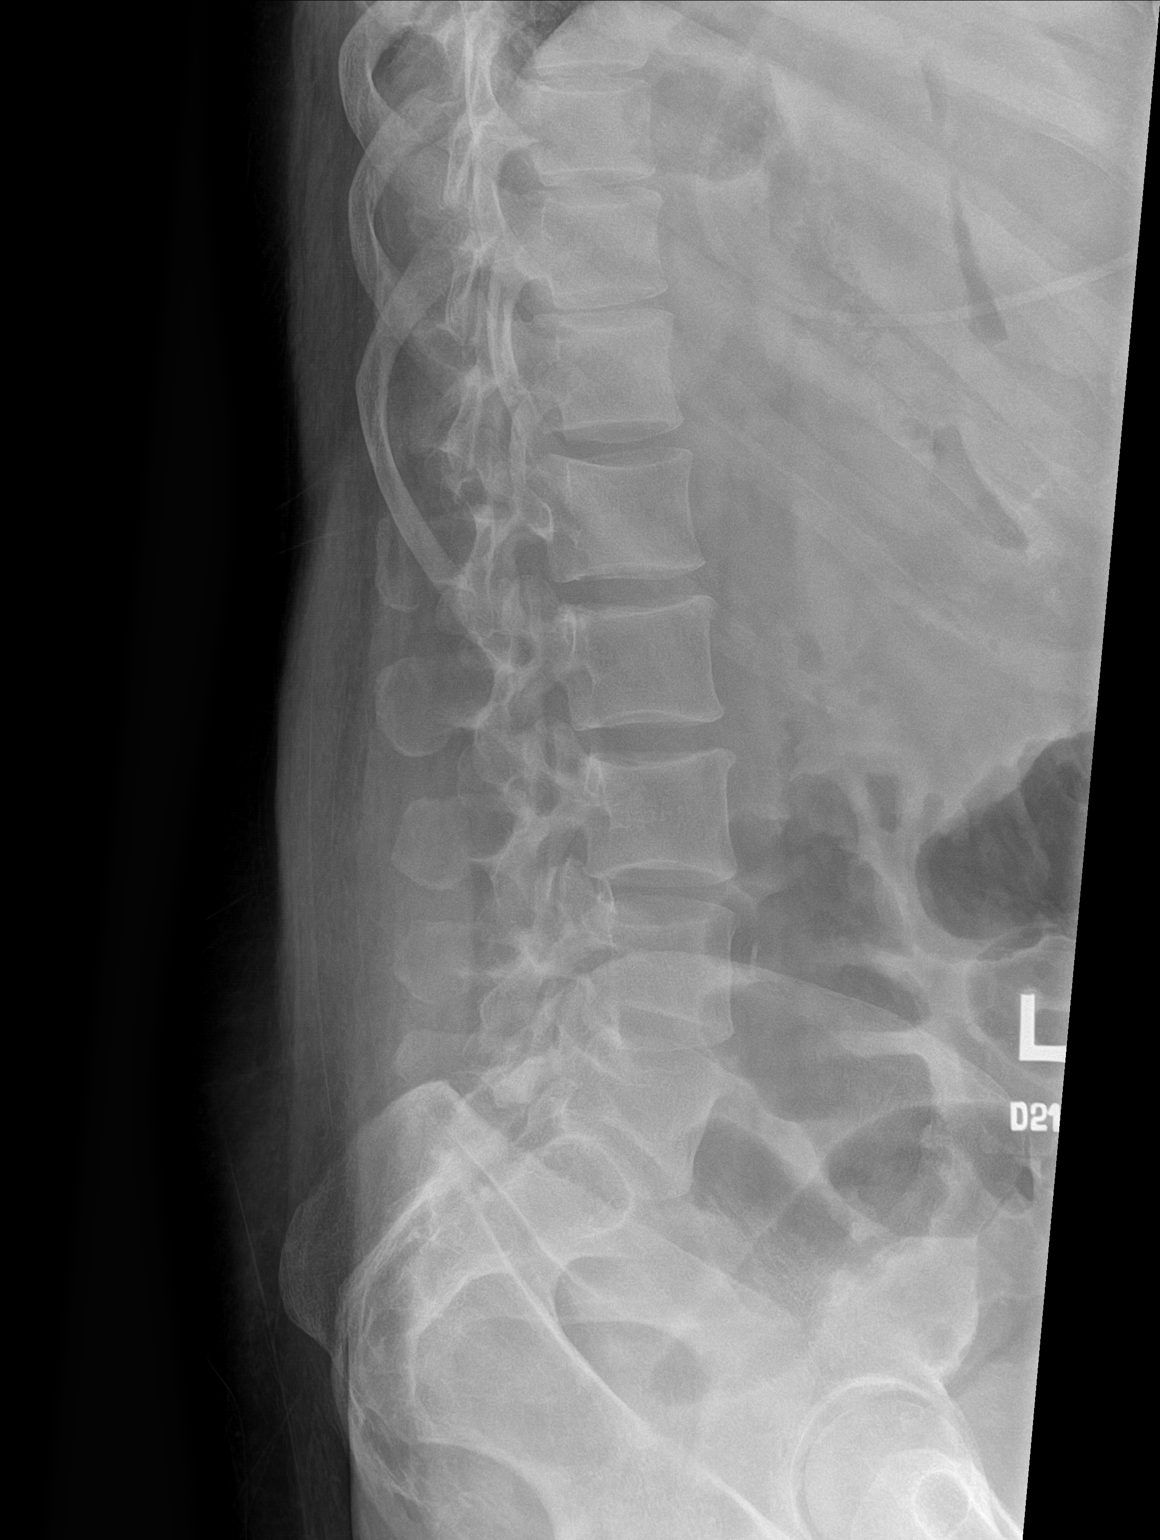

[l-spine lat (2 of 2)]
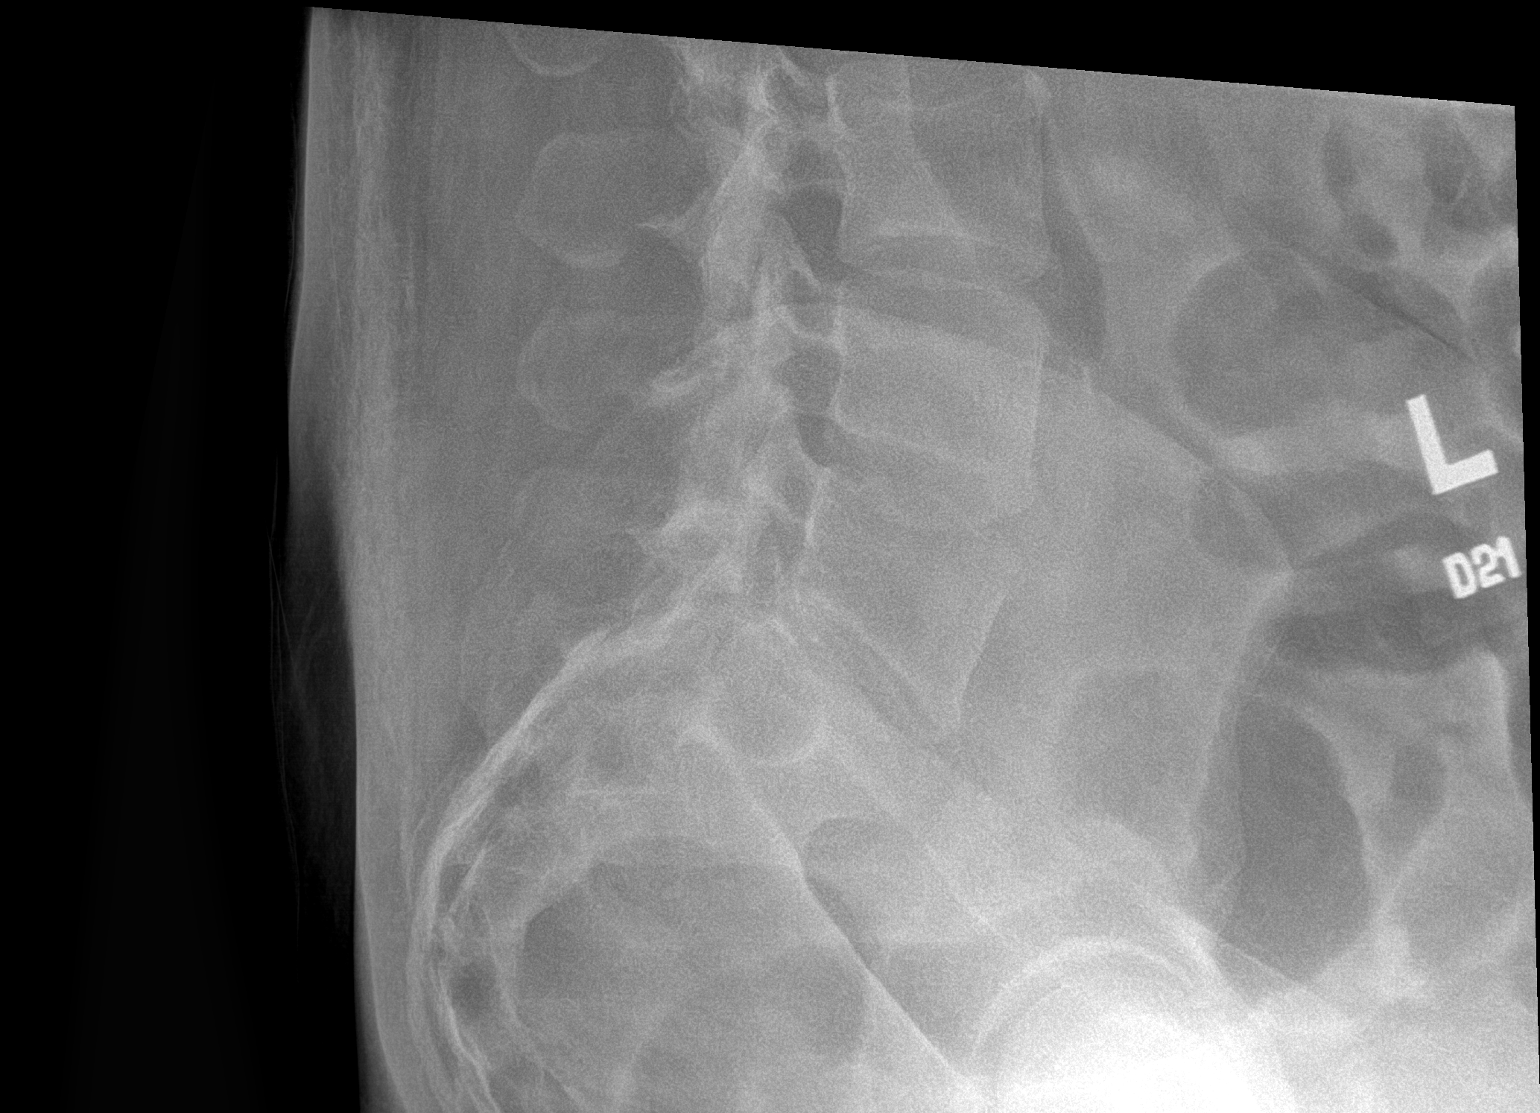

[3 of 3 positions shown; findings below may reference images not displayed]

FINDINGS: Five lumbar type vertebral bodies are well visualized. Vertebral
body height is well maintained. No anterolisthesis is seen. Mild
osteophytic changes are noted. No soft tissue changes are seen.
IMPRESSION: Mild degenerative change without acute abnormality.

## 2020-11-19 MED ORDER — MAGNESIUM SULFATE 4 GM/100ML IV SOLN
4.0000 g | Freq: Once | INTRAVENOUS | Status: AC
  Administered 2020-11-19: 4 g via INTRAVENOUS
  Filled 2020-11-19: qty 100

## 2020-11-19 MED ORDER — POTASSIUM CHLORIDE CRYS ER 20 MEQ PO TBCR
40.0000 meq | EXTENDED_RELEASE_TABLET | ORAL | Status: AC
  Administered 2020-11-19 (×2): 40 meq via ORAL
  Filled 2020-11-19 (×2): qty 2

## 2020-11-19 MED ORDER — LACTULOSE 10 GM/15ML PO SOLN
20.0000 g | Freq: Two times a day (BID) | ORAL | Status: DC
  Administered 2020-11-19 – 2020-11-20 (×3): 20 g via ORAL
  Filled 2020-11-19 (×3): qty 30

## 2020-11-19 MED ORDER — METHOCARBAMOL 500 MG PO TABS
500.0000 mg | ORAL_TABLET | Freq: Three times a day (TID) | ORAL | Status: DC | PRN
  Administered 2020-11-20 (×2): 500 mg via ORAL
  Filled 2020-11-19 (×2): qty 1

## 2020-11-19 NOTE — Progress Notes (Signed)
Judy Meyer 12:27 PM  Subjective: Patient seen and examined and surgical note seen and patient without any new complaints and we discussed the surgical team's recommendations and she is eating okay and says her pain is better  Objective: Vital signs stable afebrile she is actually more tender right upper quadrant today than she was yesterday liver tests slight decrease INR okay Assessment: Right upper quadrant pain questionable etiology  Plan: She missed her last liver clinic appointment in early April and recommend her follow-up with them ASAP to evaluate whether they agree that surgery is the next step and might consider repeat liver biopsy and Intra-Op cholangiogram as well and please call us back if we can be of any further assistance with this hospital stay  [facility]  office [phone] After 5PM or if no answer call [phone]

## 2020-11-19 NOTE — Progress Notes (Signed)
Patient has a HIDA scan that is negative for cholecystitis.  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  We would not recommend lap chole at this time. She has a hepatologist/transplant hepatologist at [facility]  She should follow up with this physician for further hepatobiliary care.  At that time, if they wanted to consider cholecystectomy they could, but we would not recommend it at this time as it is not likely to be the source.  She does not need follow up with [facility] as if she were to get a lap chole, we would recommend a tertiary care facility.  We will sign off.  Henreitta Cea 7:34 AM 11/19/2020

## 2020-11-19 NOTE — Progress Notes (Signed)
PT Cancellation Note  Patient Details Name: TALEEYA BLONDIN MRN: [id number] DOB: 05-12-1961   Cancelled Treatment:    Reason Eval/Treat Not Completed: Fatigue/lethargy limiting ability to participate;Pain limiting ability to participate Pt reports fall last night and declines mobility today.  Pt reports she has pain however has had meds.  Encouraged movement/mobility as pt has not yet mobilized outside of room in days however she continues to decline stating, "I just can't today."   Rosanna Bickle,KATHrine E 11/19/2020, 11:12 AM Arlyce Dice, DPT Acute Rehabilitation Services Pager: [phone] Office: [phone]

## 2020-11-19 NOTE — Progress Notes (Signed)
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  Patient had extensive work-up for acute hepatitis in 07/2020.  She was seen by [facility] a pathologist outpatient, and completed steroid course.   In ED, hemodynamically stable.  K2.2.  AST 147.  Total bili 9.4 (lower than prior).  Ammonia 125.  CK 537.   The next day, GI consulted.  RUQ [place] negative for acute cholecystitis but unchanged gallbladder hydrops and increased coarse echotexture of the liver concerning for hepatic steatosis or fibrosis.  GI recommended general surgery recommendation for possible cholecystectomy.  HIDA scan ordered per recommendation by general surgery and showed biliary dyskinesia but no cholecystitis.  General surgery does not recommend lap chole.  Her TSH was also elevated to 43.   Subjective: Seen and examined earlier this morning.  Patient slipped on wet floor coming out of bathroom and fell on her knees.  She denies hitting her head or loss of consciousness.  She complains lower back pain but denies radiation to her legs.  She rates her pain 7/10.  She denies numbness or tingling in her legs but her left great toe.  She denies chest pain or dyspnea.  She also reports RUQ pain.  She rates her pain 9/10.  She reports nausea but no emesis.  Objective: Vitals:   11/18/20 2037 11/19/20 0026 11/19/20 0423 11/19/20 1433  BP: 124/73 (!) 160/80 114/62 130/60  Pulse: 96 (!) 101 93 99  Resp: 17 20 17 14    Temp: 99.7 F (37.6 C) 98.9 F (37.2 C) 99.3 F (37.4 C) 99.3 F (37.4 C)  TempSrc: Oral Oral Oral Oral  SpO2: 98% 100% 100% 100%  Weight:      Height:        Intake/Output Summary (Last 24 hours) at 11/19/2020 1645 Last data filed at 11/19/2020 1000 Gross per 24 hour  Intake 1940 ml  Output 800 ml  Net 1140 ml   Filed Weights   11/14/20 1440 11/14/20 2250  Weight: 68 kg 73.2 kg    Examination:   GENERAL: No apparent distress.  Nontoxic. HEENT: MMM.  Vision and hearing grossly intact.  NECK: Supple.  No apparent JVD.  RESP:  No IWOB.  Fair aeration bilaterally. CVS:  RRR. Heart sounds normal.  ABD/GI/GU: BS+. Abd soft.  RUQ tenderness. MSK/EXT:  Moves extremities.  Tenderness over lumbar spines and paraspinal muscles. SKIN: no apparent skin lesion or wound NEURO: Awake, alert and oriented appropriately.  Motor, sensory, patellar and Achilles reflexes intact. PSYCH: Somewhat tearful  Procedures:  None  Microbiology summarized: [id number] and influenza PCR nonreactive.  Assessment & Plan: Intactable nausea and vomiting-due to cholestasis?  CT abdomen and pelvis with hepatic steatosis, mild hepatomegaly, unchanged gallbladder hydrops on wall thickening of mid stomach.  RUQ [place] negative for acute cholecystitis but unchanged gallbladder hydrops and possible hepatic steatosis/fibrosis.  HIDA scan with biliary dyskinesia but no cholecystitis.  Patient continues to endorse significant pain and nausea but no emesis since admission. -Appreciate GI recommendation-lap chole with repeat liver biopsy and intraoperative cholangiogram but surgery does not recommend lap  chole.  GI and general surgery signed off. -Continue antiemetics and pain medications with IV fluid -Advance to full liquid diet.  Acute hepatic encephalopathy: had some forgetfulness on presentation.  Ammonia 125>>> 84> 41.  Had multiple bowel movements overnight. -Decrease lactulose to 20 g twice daily -Continue  monitoring  Subacute hepatitis/cholestasis-had extensive but unrevealing work-up recently. Hyperbilirubinemia: Bilirubin fluctuating.  Trended down to 9.8 today Elevated AST-could be due to rhabdo or alcohol.  Improving. -She may need outpatient follow-up with hepatologist -Continue home Ursodil -Cholestyramine for pruritus.  Coagulopathy-likely due to subacute hepatitis.  Resolved.  Accidental fall in the hospital-reportedly slipped on wet floor and fell on her knees.  Complains lower back pain but no neuro deficit.  X-ray of lumbar spine reassuring.  Full range of motion in her knee. -Start Robaxin 500 mg 3 times daily as needed -Fall precaution -Continue PT/OT  Hyponatremia: Stable. -Monitor  Hypokalemia/hypomagnesemia/hypocalcemia: Resolved.  Anion gap metabolic acidosis: Anion gap closed -Continue sodium bicarbonate  Prolonged QT: Improved.  QTc 450.  Likely due to electrolyte arrangement -Minimize/avoid QT prolonging drugs -Optimize electrolytes  Mild nontraumatic rhabdomyolysis: CK 456>> 330>> 229.  Resolved.  Conjunctivitis: Does not look bacteria on my exam.  Resolved. -Discontinued eyedrop  Essential hypertension: Normotensive -Continue amlodipine  Hypothyroidism: TSH 43.  Reports not taking his Synthroid for 3 weeks due to "nausea". -Increased levothyroxine to 50 mcg daily on 4/9.  Depression: Stable -Continue home medications  Thrombocytopenia: Likely due to liver disease and alcohol. Recent Labs  Lab 11/14/20 1525 11/15/20 0541 11/16/20 0908 11/17/20 0437 11/18/20 1112  PLT 171 133* 146* 141* 133*  -Continue monitoring  Debility/physical deconditioning -Continue PT/OT  Body mass index is 26.05 kg/m. Nutrition Problem: Inadequate oral intake Etiology: acute illness,nausea,vomiting Signs/Symptoms: per patient/family report Interventions: Boost Breeze,MVI,Prostat   DVT prophylaxis:  SCDs Start: 11/14/20 2136  Code Status: Full  code Family Communication: Patient and/or RN. Available if any question.  Level of care: Med-Surg Status is: Inpatient  Remains inpatient appropriate because:Persistent severe electrolyte disturbances, IV treatments appropriate due to intensity of illness or inability to take PO and Inpatient level of care appropriate due to severity of illness   Dispo: The patient is from: Home              Anticipated d/c is to: Home on 4/14              Patient currently is not medically stable to d/c.   Difficult to place patient No       Consultants:  Gastroenterology signed off General surgery signed off    Sch Meds:  Scheduled Meds: . (feeding supplement) PROSource Plus  30 mL Oral TID BM  . amLODipine  10 mg Oral Daily  . cholestyramine light  4 g Oral Q12H  . feeding supplement  1 Container Oral TID BM  . lactulose  20 g Oral BID  . levothyroxine  50 mcg Oral Q0600  . multivitamin with minerals  1 tablet Oral Daily  . sodium bicarbonate  650 mg Oral TID   Continuous Infusions: . sodium chloride Stopped (11/15/20 0300)  . 0.9 % NaCl with KCl 40 mEq / L 75 mL/hr at 11/19/20 1249   PRN Meds:.sodium chloride, albuterol, LORazepam, methocarbamol, morphine injection  Antimicrobials: Anti-infectives (From admission, onward)   None       I have personally reviewed the following labs and images: CBC: Recent Labs  Lab 11/14/20 1525 11/15/20 0541 11/16/20 0908 11/17/20 0437 11/18/20 1112  WBC 7.9  6.2 7.6 5.9 6.9  HGB 13.4 12.2 11.6* 10.5* 10.5*  HCT 41.0 37.5 36.7 32.7* 33.4*  MCV 94.9 95.7 99.2 99.4 102.5*  PLT 171 133* 146* 141* 133*   BMP &GFR Recent Labs  Lab 11/14/20 1626 11/15/20 0541 11/15/20 1444 11/16/20 0244 11/17/20 0437 11/18/20 1112 11/19/20 0423  NA  --  134* 134* 133* 133* 134*  --   K  --  2.3* 3.8 4.3 3.1* 4.3 3.4*  CL  --  101 103 101 104 109  --   CO2  --  19* 17* 16* 18* 16*  --   GLUCOSE  --  109* 125* 101* 97 112*  --   BUN  --  <5* <5*  <5* <5* <5*  --   CREATININE  --  0.77 0.81 0.47 0.61 0.51  --   CALCIUM  --  7.4* 8.3* 8.0* 8.5* 8.0*  --   MG 1.3* 1.7  --   --  1.5* 1.9 1.5*   Estimated Creatinine Clearance: 78.5 mL/min (by C-G formula based on SCr of 0.51 mg/dL). Liver & Pancreas: Recent Labs  Lab 11/15/20 1444 11/16/20 0244 11/17/20 0437 11/18/20 1112 11/19/20 0423  AST 124* 118* 88* 106* 95*  ALT 22 19 20 19 20   ALKPHOS 92 99 95 91 86  BILITOT 8.1* 8.3* 8.0* 11.0* 9.8*  PROT 6.1* 6.3* 5.9* 5.3* 5.0*  ALBUMIN 3.0* 3.1* 2.7* 2.5* 2.3*   Recent Labs  Lab 11/14/20 1525  LIPASE 99*   Recent Labs  Lab 11/15/20 0541 11/16/20 0908 11/17/20 0437 11/18/20 1112 11/19/20 0423  AMMONIA 87* 84* 53* 54* 41*   Diabetic: No results for input(s): HGBA1C in the last 72 hours. Recent Labs  Lab 11/14/20 1527  GLUCAP 86   Cardiac Enzymes: Recent Labs  Lab 11/14/20 1525 11/15/20 0541 11/16/20 0244 11/17/20 0437 11/18/20 1112  CKTOTAL 537* 428* 456* 330* 229   No results for input(s): PROBNP in the last 8760 hours. Coagulation Profile: Recent Labs  Lab 11/14/20 1525 11/16/20 0244 11/17/20 0437 11/19/20 0423  INR 1.2 2.6* 1.0 1.2   Thyroid Function Tests: No results for input(s): TSH, T4TOTAL, FREET4, T3FREE, THYROIDAB in the last 72 hours. Lipid Profile: No results for input(s): CHOL, HDL, LDLCALC, TRIG, CHOLHDL, LDLDIRECT in the last 72 hours. Anemia Panel: No results for input(s): VITAMINB12, FOLATE, FERRITIN, TIBC, IRON, RETICCTPCT in the last 72 hours. Urine analysis:    Component Value Date/Time   COLORURINE AMBER (A) 11/14/2020 2040   APPEARANCEUR CLEAR 11/14/2020 2040   LABSPEC 1.027 11/14/2020 2040   PHURINE 8.0 11/14/2020 2040   GLUCOSEU NEGATIVE 11/14/2020 2040   HGBUR SMALL (A) 11/14/2020 2040   BILIRUBINUR NEGATIVE 11/14/2020 2040   KETONESUR NEGATIVE 11/14/2020 2040   PROTEINUR NEGATIVE 11/14/2020 2040   NITRITE NEGATIVE 11/14/2020 2040   LEUKOCYTESUR NEGATIVE 11/14/2020  2040   Sepsis Labs: Invalid input(s): PROCALCITONIN, [place]  Microbiology: Recent Results (from the past 240 hour(s))  Resp Panel by RT-PCR (Flu A&B, Covid) Nasopharyngeal Swab     Status: None   Collection Time: 11/14/20  6:41 PM   Specimen: Nasopharyngeal Swab; Nasopharyngeal(NP) swabs in vial transport medium  Result Value Ref Range Status   SARS Coronavirus 2 by RT PCR NEGATIVE NEGATIVE Final    Comment: (NOTE) SARS-CoV-2 target nucleic acids are NOT DETECTED.  The SARS-CoV-2 RNA is generally detectable in upper respiratory specimens during the acute phase of infection. The lowest concentration of SARS-CoV-2 viral copies this assay can detect is 138 copies/mL.  A negative result does not preclude SARS-Cov-2 infection and should not be used as the sole basis for treatment or other patient management decisions. A negative result may occur with  improper specimen collection/handling, submission of specimen other than nasopharyngeal swab, presence of viral mutation(s) within the areas targeted by this assay, and inadequate number of viral copies(<138 copies/mL). A negative result must be combined with clinical observations, patient history, and epidemiological information. The expected result is Negative.  Fact Sheet for Patients:  EntrepreneurPulse.com.au  Fact Sheet for Healthcare Providers:  IncredibleEmployment.be  This test is no t yet approved or cleared by the [place] FDA and  has been authorized for detection and/or diagnosis of SARS-CoV-2 by FDA under an Emergency Use Authorization (EUA). This EUA will remain  in effect (meaning this test can be used) for the duration of the COVID-19 declaration under Section 564(b)(1) of the Act, 21 U.S.C.section 360bbb-3(b)(1), unless the authorization is terminated  or revoked sooner.       Influenza A by PCR NEGATIVE NEGATIVE Final   Influenza B by PCR NEGATIVE NEGATIVE Final     Comment: (NOTE) The Xpert Xpress SARS-CoV-2/FLU/RSV plus assay is intended as an aid in the diagnosis of influenza from Nasopharyngeal swab specimens and should not be used as a sole basis for treatment. Nasal washings and aspirates are unacceptable for Xpert Xpress SARS-CoV-2/FLU/RSV testing.  Fact Sheet for Patients: EntrepreneurPulse.com.au  Fact Sheet for Healthcare Providers: IncredibleEmployment.be  This test is not yet approved or cleared by the [place] FDA and has been authorized for detection and/or diagnosis of SARS-CoV-2 by FDA under an Emergency Use Authorization (EUA). This EUA will remain in effect (meaning this test can be used) for the duration of the COVID-19 declaration under Section 564(b)(1) of the Act, 21 U.S.C. section 360bbb-3(b)(1), unless the authorization is terminated or revoked.  Performed at [facility], [place] [address] [place], [place] [address]     Radiology Studies: DG Lumbar Spine 2-3 Views  Result Date: 11/19/2020 CLINICAL DATA:  Low back pain following fall, initial encounter EXAM: LUMBAR SPINE - 3 VIEW COMPARISON:  07/11/2018 FINDINGS: Five lumbar type vertebral bodies are well visualized. Vertebral body height is well maintained. No anterolisthesis is seen. Mild osteophytic changes are noted. No soft tissue changes are seen. IMPRESSION: Mild degenerative change without acute abnormality. Electronically Signed   By: Inez Catalina M.D.   On: 11/19/2020 11:08     Kemond Amorin T. [place]  If 7PM-7AM, please contact night-coverage [email or web address] 11/19/2020, 4:45 PM

## 2020-11-19 NOTE — Progress Notes (Signed)
Patient observed on the floor outside of bathroom. Stated that she slipped on wet bathroom floor. Bathroom noted to have watery bowel on floor. Denies hitting head, stated she fell on her butt. Abrasion noted to left knee. No redness nor swelling noted.Patient able to perform active ROM with no c/o pain.

## 2020-11-20 DIAGNOSIS — G8929 Other chronic pain: Secondary | ICD-10-CM

## 2020-11-20 DIAGNOSIS — K7200 Acute and subacute hepatic failure without coma: Principal | ICD-10-CM

## 2020-11-20 DIAGNOSIS — D649 Anemia, unspecified: Secondary | ICD-10-CM

## 2020-11-20 DIAGNOSIS — R1011 Right upper quadrant pain: Secondary | ICD-10-CM

## 2020-11-20 DIAGNOSIS — R5381 Other malaise: Secondary | ICD-10-CM

## 2020-11-20 DIAGNOSIS — D696 Thrombocytopenia, unspecified: Secondary | ICD-10-CM

## 2020-11-20 DIAGNOSIS — F1010 Alcohol abuse, uncomplicated: Secondary | ICD-10-CM

## 2020-11-20 LAB — CBC
HCT: 29.6 % — ABNORMAL LOW (ref 36.0–46.0)
Hemoglobin: 9.4 g/dL — ABNORMAL LOW (ref 12.0–15.0)
MCH: 32.2 pg (ref 26.0–34.0)
MCHC: 31.8 g/dL (ref 30.0–36.0)
MCV: 101.4 fL — ABNORMAL HIGH (ref 80.0–100.0)
Platelets: 192 10*3/uL (ref 150–400)
RBC: 2.92 MIL/uL — ABNORMAL LOW (ref 3.87–5.11)
RDW: 23.2 % — ABNORMAL HIGH (ref 11.5–15.5)
WBC: 6.7 10*3/uL (ref 4.0–10.5)
nRBC: 0 % (ref 0.0–0.2)

## 2020-11-20 LAB — COMPREHENSIVE METABOLIC PANEL
ALT: 19 U/L (ref 0–44)
AST: 93 U/L — ABNORMAL HIGH (ref 15–41)
Albumin: 2.3 g/dL — ABNORMAL LOW (ref 3.5–5.0)
Alkaline Phosphatase: 94 U/L (ref 38–126)
Anion gap: 7 (ref 5–15)
BUN: <5 mg/dL — ABNORMAL LOW (ref 6–20)
CO2: 20 mmol/L — ABNORMAL LOW (ref 22–32)
Calcium: 8 mg/dL — ABNORMAL LOW (ref 8.9–10.3)
Chloride: 111 mmol/L (ref 98–111)
Creatinine, Ser: 0.44 mg/dL (ref 0.44–1.00)
GFR, Estimated: >60 mL/min (ref >60)
Glucose, Bld: 90 mg/dL (ref 70–99)
Potassium: 4.4 mmol/L (ref 3.5–5.1)
Sodium: 138 mmol/L (ref 135–145)
Total Bilirubin: 12.9 mg/dL — ABNORMAL HIGH (ref 0.3–1.2)
Total Protein: 5.1 g/dL — ABNORMAL LOW (ref 6.5–8.1)

## 2020-11-20 LAB — MAGNESIUM: Magnesium: 2.1 mg/dL (ref 1.7–2.4)

## 2020-11-20 LAB — PHOSPHORUS: Phosphorus: 2.1 mg/dL — ABNORMAL LOW (ref 2.5–4.6)

## 2020-11-20 LAB — AMMONIA: Ammonia: 36 umol/L — ABNORMAL HIGH (ref 9–35)

## 2020-11-20 MED ORDER — OXYCODONE HCL 5 MG PO TABS
5.0000 mg | ORAL_TABLET | Freq: Four times a day (QID) | ORAL | Status: DC | PRN
  Administered 2020-11-20: 5 mg via ORAL
  Filled 2020-11-20: qty 1

## 2020-11-20 MED ORDER — LEVOTHYROXINE SODIUM 50 MCG PO TABS: 50.0000 ug | ORAL_TABLET | Freq: Every day | ORAL | 0 refills | Status: DC

## 2020-11-20 MED ORDER — OXYCODONE HCL 5 MG PO TABS: 5.0000 mg | ORAL_TABLET | Freq: Three times a day (TID) | ORAL | 0 refills | Status: AC | PRN

## 2020-11-20 MED ORDER — PANTOPRAZOLE SODIUM 40 MG PO TBEC: 1.0000 | DELAYED_RELEASE_TABLET | Freq: Every day | ORAL | 0 refills | Status: DC

## 2020-11-20 MED ORDER — FOLIC ACID 1 MG PO TABS: 1.0000 mg | ORAL_TABLET | Freq: Every day | ORAL | 1 refills | Status: DC

## 2020-11-20 MED ORDER — SODIUM BICARBONATE 650 MG PO TABS: 650.0000 mg | ORAL_TABLET | Freq: Three times a day (TID) | ORAL | 0 refills | Status: DC

## 2020-11-20 MED ORDER — URSODIOL 250 MG PO TABS: 250.0000 mg | ORAL_TABLET | Freq: Three times a day (TID) | ORAL | 1 refills | Status: DC

## 2020-11-20 MED ORDER — METHOCARBAMOL 500 MG PO TABS: 500.0000 mg | ORAL_TABLET | Freq: Three times a day (TID) | ORAL | 0 refills | Status: DC | PRN

## 2020-11-20 MED ORDER — CHOLESTYRAMINE LIGHT 4 G PO PACK: 4.0000 g | PACK | Freq: Two times a day (BID) | ORAL | 0 refills | Status: DC

## 2020-11-20 MED ORDER — ADULT MULTIVITAMIN W/MINERALS CH: 1.0000 | ORAL_TABLET | Freq: Every day | ORAL | 1 refills | Status: DC

## 2020-11-20 MED ORDER — AMLODIPINE BESYLATE 10 MG PO TABS: 10.0000 mg | ORAL_TABLET | Freq: Every day | ORAL | 0 refills | Status: AC

## 2020-11-20 MED ORDER — LACTULOSE 10 GM/15ML PO SOLN: 20.0000 g | Freq: Two times a day (BID) | ORAL | 1 refills | Status: AC

## 2020-11-20 MED ORDER — THIAMINE HCL 100 MG PO TABS: 100.0000 mg | ORAL_TABLET | Freq: Every day | ORAL | 0 refills | Status: DC

## 2020-11-20 MED ORDER — VENLAFAXINE HCL ER 75 MG PO CP24: 75.0000 mg | ORAL_CAPSULE | Freq: Every day | ORAL | 0 refills | Status: AC

## 2020-11-20 MED ORDER — ONDANSETRON HCL 4 MG PO TABS: 4.0000 mg | ORAL_TABLET | Freq: Every day | ORAL | 1 refills | Status: AC | PRN

## 2020-11-20 NOTE — Progress Notes (Signed)
OT Cancellation Note  Patient Details Name: Judy Meyer MRN: [id number] DOB: 04-22-1961   Cancelled Treatment:    Reason Eval/Treat Not Completed: Patient declined, no reason specified. Patient declines therapy stating "they just told me they can't do anything for me, I'm not quite in the mood." Patient tearful. Per Rn patient has been up out of the bed with nursing and still is able to perform functional mobility and ADLs just with nursing supervision since fall. Patient has been declining therapy services. Therapist had suggested [facility] services on prior evaluation but patient declined. Therapist asked patient if she would be agreeable to [facility] therapy services now and she stated "that's fine."  Will f/u as able to see if patient will participate at a later time.  Marci Polito L Jalene Lacko 11/20/2020, 9:21 AM

## 2020-11-20 NOTE — Progress Notes (Signed)
Pt and female visitor reviewed discharge instructions with this RN. All questions answered. Pt taken to main entrance via wheel chair by staff.

## 2020-11-20 NOTE — Discharge Summary (Signed)
Physician Discharge Summary  Judy Meyer WPY:[id number] DOB: 10/28/1960 DOA: 11/14/2020  PCP: Pcp, No  Admit date: 11/14/2020 Discharge date: 11/20/2020  Admitted From: Home Disposition: Home  Recommendations for Outpatient Follow-up:  1. Patient planning to move to [place] to stay with her boyfriend.  Encouraged to establish PCP 2. Patient to follow-up with her hepatologist at [facility]. 3. Please obtain CBC/CMP/Mag at follow up 4. Please follow up on the following pending results: None  Home Health: PT/OT Equipment/Devices: None  Discharge Condition: Stable CODE STATUS: Full code   Follow-up Information    [facility] Follow up.   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  Patient had extensive work-up for acute hepatitis in 07/2020.  She was seen by [facility] a pathologist outpatient, and completed steroid course.   In ED, hemodynamically stable.  K2.2.  AST 147.  Total bili 9.4 (lower than prior).  Ammonia 125.  CK 537.  Patient was started on lactulose and IV fluid.  The next day, GI consulted.  RUQ [place] negative for acute cholecystitis but unchanged gallbladder hydrops and increased coarse echotexture of the liver concerning for hepatic steatosis or fibrosis.  GI recommended general surgery consultation for possible cholecystectomy. The later recommended HIDA scan which showed biliary dyskinesia but no cholecystitis. General surgery didn't  think lap chole is indicated.  Off note,  TSH was also elevated to 43 likely due noncompliance with her Synthroid. She also has had an accidental fall while in house but no head or other major injury.  Home health PT/OT ordered as recommended by therapy.  On the day of discharge, encephalopathy, hypokalemia and rhabdomyolysis resolved.  Patient has not had further emesis since admission.  Bilirubin remained elevated but stable.  She continues to have RUQ pain which did not improve or get worse with change in diet.  GI and general surgery recommended outpatient follow-up with her hepatologist, and signed off.  She has been advised to avoid alcohol and Tylenol.  See individual problem list below for more on hospital course.  Discharge Diagnoses:  Intactable nausea, vomiting and RUQ pain-due to cholestasis?  CT abdomen and pelvis with hepatic steatosis, mild hepatomegaly, unchanged gallbladder hydrops on wall thickening of mid stomach.  RUQ [place] negative for acute cholecystitis but unchanged gallbladder hydrops and possible hepatic steatosis/fibrosis.  HIDA scan with biliary dyskinesia but no cholecystitis.  Patient continues to endorse nausea and pain regardless of diet adjustment.  She has not further emesis since hospitalization. -Rx for oxycodone and Zofran -Outpatient follow-up with her hepatologist as recommended by GI and general surgery -Advised to avoid alcohol and Tylenol.  Acute hepatic encephalopathy: had some forgetfulness on presentation.  Ammonia 125>>> 84> 36.  -Discharging on lactulose to 20 g twice daily  Subacute hepatitis/cholestasis-had extensive but unrevealing work-up recently. Hyperbilirubinemia: Bilirubin fluctuating between 8 and 12 Elevated AST-could be due to rhabdo or alcohol.  Improved -Outpatient follow-up with hepatologist -Continue home Ursodil -Continue cholestyramine for pruritus.  Coagulopathy-likely due to subacute hepatitis.  Resolved.  Accidental fall  in the hospital-reportedly  slipped on wet floor and fell on her knees.  Complains lower back pain but no neuro deficit.  X-ray of lumbar spine without acute finding but significant degenerative changes..  Full range of motion in her knees. -Robaxin 500 mg 3 times daily as needed -Oxycodone as above -Home health PT and OT  Hyponatremia/hypokalemia/hypomagnesemia/hypocalcemia: Resolved.  Anion gap metabolic acidosis: Anion gap closed.  Acidosis improved. -Continue p.o. sodium bicarbonate  Prolonged QT: Improved.  QTc 450.  Likely due to electrolyte arrangement  Mild nontraumatic rhabdomyolysis: CK 456>> 330>> 229.  Resolved.  Conjunctivitis?  Resolved. -Discontinued  antibiotic eyedrop  Essential hypertension: Normotensive -Continue amlodipine  Hypothyroidism: TSH 43.  Reports not taking his Synthroid for 3 weeks due to "nausea". -Increased levothyroxine to 50 mcg daily on 4/9. -Recheck TSH in 3 to 4 weeks and adjust as appropriate  Anxiety and depression: Appears a little anxious -Continue home medications  Alcohol abuse: Reports "not more than half cup of vodka". He liver disease, electrolyte derangements, anemia, thrombocytopenia... Suggest that she might be drinking more than she reports. 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  H&H stable. She had folic acid deficiency Recent Labs    07/28/20 1435 07/31/20 0457 08/03/20 0359 08/06/20 0407 11/14/20 1525 11/15/20 0541 11/16/20 0908 11/17/20 0437 11/18/20 1112 11/20/20 0407  HGB 8.7* 9.5* 9.7* 9.1* 13.4 12.2 11.6* 10.5* 10.5* 9.4*  -Recheck CBC at follow-up -Continue folic acid and multivitamin   Thrombocytopenia: Likely due to liver disease and alcohol.  Resolved. Recent Labs  Lab 11/14/20 1525 11/15/20 0541 11/16/20 0908 11/17/20 0437 11/18/20 1112 11/20/20 0407  PLT 171 133* 146*  141* 133* 192    Debility/physical deconditioning -HH PT and OT   Overweight Body mass index is 26.05 kg/m. Nutrition Problem: Inadequate oral intake Etiology: acute illness,nausea,vomiting Signs/Symptoms: per patient/family report Interventions: Boost Breeze,MVI,Prostat      Discharge Exam: Vitals:   11/19/20 2042 11/20/20 0437  BP: 120/65 130/72  Pulse: 91 88  Resp: 16 16  Temp:  99.5 F (37.5 C)  SpO2: 97% 99%    GENERAL: No apparent distress.  Nontoxic. HEENT: MMM.  Vision and hearing grossly intact.  NECK: Supple.  No apparent JVD.  RESP: On RA.  No IWOB.  Fair aeration bilaterally. CVS:  RRR. Heart sounds normal.  ABD/GI/GU: Bowel sounds present. Soft.  RUQ tenderness MSK/EXT:  Moves extremities. No apparent deformity. No edema.  SKIN: no apparent skin lesion or wound NEURO: Awake, alert and oriented appropriately.  No apparent focal neuro deficit. PSYCH: Somewhat anxious and tearful about his overall health situation.   Discharge Instructions  Discharge Instructions    Call MD for:  extreme fatigue   Complete by: As directed    Call MD for:  persistant dizziness or light-headedness   Complete by: As directed    Call MD for:  persistant nausea and vomiting   Complete by: As directed    Call MD for:  severe uncontrolled pain   Complete by: As directed    Call MD for:  temperature >100.4   Complete by: As directed    Diet - low sodium heart healthy   Complete by: As directed    Discharge instructions   Complete by: As directed    It has been a pleasure taking care of you!  You were hospitalized with nausea, vomiting, abdominal pain and altered mental status likely from your liver disease.  However experts here recommended close follow-up with your liver doctor at [facility].  Meanwhile, we strongly recommend not drinking alcohol.  We also recommend taking your medications as prescribed.  Please review your new medication list and the directions on your  medications before you take them.   Take care,   Increase activity slowly   Complete by: As directed      Allergies as of 11/20/2020      Reactions   Compazine [prochlorperazine Edisylate] Anaphylaxis   Periactin [cyproheptadine] Anaphylaxis   Cyclobenzaprine    Palpations    Haloperidol And Related    Metoclopramide Other (See Comments)   Other Other (See Comments)   Prochlorperazine Other (See Comments)      Medication List    STOP taking these medications   famotidine 20 MG tablet Commonly known as: PEPCID   polyethylene glycol 17 g packet Commonly known as: MIRALAX / GLYCOLAX   prednisoLONE 5 MG Tabs tablet   ursodiol 300 MG capsule Commonly known as: ACTIGALL Replaced by: ursodiol 250 MG tablet     TAKE these medications   albuterol 108 (90 Base) MCG/ACT inhaler Commonly known as: VENTOLIN HFA Inhale 2 puffs into the lungs 4 (four) times daily as needed for wheezing or shortness of breath.   amLODipine 10 MG tablet Commonly known as: NORVASC Take 1 tablet (10 mg total) by mouth daily. Start taking on: November 21, 2020 What changed:   medication strength  how much to take  Another medication with the same name was removed. Continue taking this medication, and follow the directions you see here.   cholestyramine light 4 g packet Commonly known as: PREVALITE Take 1 packet (4 g total) by mouth every 12 (twelve) hours.   folic acid 1 MG tablet Commonly known as: FOLVITE Take 1 tablet (1 mg total) by mouth daily.   Galcanezumab-gnlm 120 MG/ML Soaj Inject 120 mLs into the skin every 30 (thirty) days.   lactulose 10 GM/15ML solution Commonly known as: CHRONULAC Take 30 mLs (20 g total) by mouth 2 (two) times daily.   levothyroxine 50 MCG tablet Commonly known as: SYNTHROID Take 1 tablet (50 mcg total) by mouth daily at 6 (six) AM. Start taking on: November 21, 2020 What changed:   medication strength  how much to take  when to take this    methocarbamol 500 MG tablet Commonly known as: ROBAXIN Take 1 tablet (500 mg total) by mouth every 8 (eight) hours as needed for muscle spasms.   multivitamin with minerals Tabs tablet Take 1 tablet by mouth daily. Start taking on: November 21, 2020   ondansetron 4 MG tablet Commonly known as: Zofran Take 1 tablet (4 mg total) by mouth daily as needed for nausea or vomiting.   oxyCODONE 5 MG immediate release tablet Commonly known as: Oxy IR/ROXICODONE Take 1 tablet (5 mg total) by mouth every 8 (eight) hours as needed for up to 5 days for moderate pain or severe pain. What changed:   how much to take  when to take this  reasons to take this   pantoprazole 40 MG tablet Commonly known as: PROTONIX Take 1 tablet (40 mg total) by mouth daily.   sodium bicarbonate 650 MG tablet Take 1 tablet (650 mg total) by mouth 3 (three) times daily.   thiamine 100 MG tablet Take 1 tablet (100 mg total) by mouth daily.   ursodiol 250 MG tablet Commonly known as: ACTIGALL Take 1 tablet (250 mg total) by mouth  3 (three) times daily. Replaces: ursodiol 300 MG capsule   venlafaxine XR 75 MG 24 hr capsule Commonly known as: EFFEXOR-XR Take 1 capsule (75 mg total) by mouth daily.       Consultations:  Gastroenterology  General surgery  Procedures/Studies:   DG Lumbar Spine 2-3 Views  Result Date: 11/19/2020 CLINICAL DATA:  Low back pain following fall, initial encounter EXAM: LUMBAR SPINE - 3 VIEW COMPARISON:  07/11/2018 FINDINGS: Five lumbar type vertebral bodies are well visualized. Vertebral body height is well maintained. No anterolisthesis is seen. Mild osteophytic changes are noted. No soft tissue changes are seen. IMPRESSION: Mild degenerative change without acute abnormality. Electronically Signed   By: Inez Catalina M.D.   On: 11/19/2020 11:08   CT Abdomen Pelvis W Contrast  Result Date: 11/14/2020 CLINICAL DATA:  Nausea vomiting epigastric EXAM: CT ABDOMEN AND PELVIS WITH  CONTRAST TECHNIQUE: Multidetector CT imaging of the abdomen and pelvis was performed using the standard protocol following bolus administration of intravenous contrast. CONTRAST:  146mL OMNIPAQUE IOHEXOL 300 MG/ML  SOLN COMPARISON:  None. FINDINGS: Lower chest: The visualized heart size within normal limits. No pericardial fluid/thickening. No hiatal hernia. The visualized portions of the lungs are clear. Hepatobiliary: There is diffuse low density seen throughout the liver parenchyma with mild hepatomegaly.The main portal vein is patent. No evidence of calcified gallstones, gallbladder wall thickening or biliary dilatation. Fluid-filled dilated gallbladder is again noted. Pancreas: Unremarkable. No pancreatic ductal dilatation or surrounding inflammatory changes. Spleen: Normal in size without focal abnormality. Adrenals/Urinary Tract: Both adrenal glands appear normal. There is a 3 cm low-density lesion seen in the upper pole of the right kidney. A punctate 4 mm calculus seen in lower pole of the right kidney. There is a punctate calcifications seen in the upper pole of the left kidney. The bladder is unremarkable. No hydronephrosis. Stomach/Bowel: There is apparent mild wall thickening seen within the mid body of the stomach. However no surrounding inflammatory changes are seen. The small bowel, and colon are normal in appearance. No inflammatory changes, wall thickening, or obstructive findings. Vascular/Lymphatic: There are no enlarged mesenteric, retroperitoneal, or pelvic lymph nodes. No significant vascular findings are present. Reproductive: Again noted is a fibroid uterus some with partial internal calcifications noted. Other: No evidence of abdominal wall mass or hernia. Musculoskeletal: No acute or significant osseous findings. IMPRESSION: Hepatic steatosis and mild hepatomegaly Unchanged gallbladder hydrops Apparent wall thickening of the mid body of stomach which may be due to physiologic under  distension versus mild gastritis Unchanged fibroid uterus Electronically Signed   By: Prudencio Pair M.D.   On: 11/14/2020 18:23   NM Hepato W/EF  Result Date: 11/18/2020 CLINICAL DATA:  Right upper quadrant abdominal pain. Evaluate for acute cholecystitis versus biliary dyskinesia. EXAM: NUCLEAR MEDICINE HEPATOBILIARY IMAGING WITH GALLBLADDER EF TECHNIQUE: Sequential images of the abdomen were obtained out to 60 minutes following intravenous administration of radiopharmaceutical. After oral ingestion of Ensure, gallbladder ejection fraction was determined. At 60 min, normal ejection fraction is greater than 33%. RADIOPHARMACEUTICALS:  7.7 mCi Tc-45m  Choletec IV COMPARISON:  Right upper quadrant abdominal ultrasound-11/15/2020; CT abdomen pelvis-11/14/2020 FINDINGS: There is homogeneous distribution injected radiotracer throughout the hepatic parenchyma. There is early excretion of radiotracer with opacification of the gallbladder lumen, initially seen on the 25-30 minute anterior projection planar image. Following ingestion of the Ensure, there is no significant emptying of the gallbladder, however note, a gallbladder ejection fraction could not be estimated given location of the gallbladder located in close  proximity to adjacent loops of small bowel, however morphologically there is no significant emptying of the gallbladder. Patient denied elicitation of abdominal pain following the ingestion of the Ensure. IMPRESSION: 1. No scintigraphic evidence of acute cholecystitis. 2. Findings suggestive of biliary dyskinesia with no significant emptying of the gallbladder following ingestion of the Ensure. Electronically Signed   By: Sandi Mariscal M.D.   On: 11/18/2020 12:07   US ABDOMEN LIMITED RUQ (LIVER/GB)  Result Date: 11/15/2020 CLINICAL DATA:  Right upper quadrant pain for 1 week. EXAM: ULTRASOUND ABDOMEN LIMITED RIGHT UPPER QUADRANT COMPARISON:  July 22, 2020 FINDINGS: Gallbladder: The gallbladder is  distended to 5.4 cm. No gallstones or wall thickening visualized. No sonographic Murphy sign noted by sonographer. Common bile duct: Diameter: 5 mm Liver: No focal lesion identified. Increased coarse parenchymal echogenicity. Portal vein is patent on color Doppler imaging with normal direction of blood flow towards the liver. Other: None. IMPRESSION: No evidence of acute cholecystitis. Unchanged gallbladder hydrops. Increased coarse echotexture of the liver, usually associated with hepatic steatosis or fibrosis. Electronically Signed   By: Fidela Salisbury M.D.   On: 11/15/2020 16:14        The results of significant diagnostics from this hospitalization (including imaging, microbiology, ancillary and laboratory) are listed below for reference.     Microbiology: Recent Results (from the past 240 hour(s))  Resp Panel by RT-PCR (Flu A&B, Covid) Nasopharyngeal Swab     Status: None   Collection Time: 11/14/20  6:41 PM   Specimen: Nasopharyngeal Swab; Nasopharyngeal(NP) swabs in vial transport medium  Result Value Ref Range Status   SARS Coronavirus 2 by RT PCR NEGATIVE NEGATIVE Final    Comment: (NOTE) SARS-CoV-2 target nucleic acids are NOT DETECTED.  The SARS-CoV-2 RNA is generally detectable in upper respiratory specimens during the acute phase of infection. The lowest concentration of SARS-CoV-2 viral copies this assay can detect is 138 copies/mL. A negative result does not preclude SARS-Cov-2 infection and should not be used as the sole basis for treatment or other patient management decisions. A negative result may occur with  improper specimen collection/handling, submission of specimen other than nasopharyngeal swab, presence of viral mutation(s) within the areas targeted by this assay, and inadequate number of viral copies(<138 copies/mL). A negative result must be combined with clinical observations, patient history, and epidemiological information. The expected result is  Negative.  Fact Sheet for Patients:  EntrepreneurPulse.com.au  Fact Sheet for Healthcare Providers:  IncredibleEmployment.be  This test is no t yet approved or cleared by the [place] FDA and  has been authorized for detection and/or diagnosis of SARS-CoV-2 by FDA under an Emergency Use Authorization (EUA). This EUA will remain  in effect (meaning this test can be used) for the duration of the COVID-19 declaration under Section 564(b)(1) of the Act, 21 U.S.C.section 360bbb-3(b)(1), unless the authorization is terminated  or revoked sooner.       Influenza A by PCR NEGATIVE NEGATIVE Final   Influenza B by PCR NEGATIVE NEGATIVE Final    Comment: (NOTE) The Xpert Xpress SARS-CoV-2/FLU/RSV plus assay is intended as an aid in the diagnosis of influenza from Nasopharyngeal swab specimens and should not be used as a sole basis for treatment. Nasal washings and aspirates are unacceptable for Xpert Xpress SARS-CoV-2/FLU/RSV testing.  Fact Sheet for Patients: EntrepreneurPulse.com.au  Fact Sheet for Healthcare Providers: IncredibleEmployment.be  This test is not yet approved or cleared by the [place] FDA and has been authorized for detection and/or diagnosis of SARS-CoV-2  by FDA under an Emergency Use Authorization (EUA). This EUA will remain in effect (meaning this test can be used) for the duration of the COVID-19 declaration under Section 564(b)(1) of the Act, 21 U.S.C. section 360bbb-3(b)(1), unless the authorization is terminated or revoked.  Performed at [facility], [address] [place], [place] [address]      Labs:  CBC: Recent Labs  Lab 11/15/20 0541 11/16/20 0908 11/17/20 0437 11/18/20 1112 11/20/20 0407  WBC 6.2 7.6 5.9 6.9 6.7  HGB 12.2 11.6* 10.5* 10.5* 9.4*  HCT 37.5 36.7 32.7* 33.4* 29.6*  MCV 95.7 99.2 99.4 102.5* 101.4*  PLT 133* 146* 141* 133* 192    BMP &GFR Recent Labs  Lab 11/15/20 0541 11/15/20 1444 11/16/20 0244 11/17/20 0437 11/18/20 1112 11/19/20 0423 11/20/20 0407  NA 134* 134* 133* 133* 134*  --  138  K 2.3* 3.8 4.3 3.1* 4.3 3.4* 4.4  CL 101 103 101 104 109  --  111  CO2 19* 17* 16* 18* 16*  --  20*  GLUCOSE 109* 125* 101* 97 112*  --  90  BUN <5* <5* <5* <5* <5*  --  <5*  CREATININE 0.77 0.81 0.47 0.61 0.51  --  0.44  CALCIUM 7.4* 8.3* 8.0* 8.5* 8.0*  --  8.0*  MG 1.7  --   --  1.5* 1.9 1.5* 2.1  PHOS  --   --   --   --   --   --  2.1*   Estimated Creatinine Clearance: 78.5 mL/min (by C-G formula based on SCr of 0.44 mg/dL). Liver & Pancreas: Recent Labs  Lab 11/16/20 0244 11/17/20 0437 11/18/20 1112 11/19/20 0423 11/20/20 0407  AST 118* 88* 106* 95* 93*  ALT 19 20 19 20 19   ALKPHOS 99 95 91 86 94  BILITOT 8.3* 8.0* 11.0* 9.8* 12.9*  PROT 6.3* 5.9* 5.3* 5.0* 5.1*  ALBUMIN 3.1* 2.7* 2.5* 2.3* 2.3*   Recent Labs  Lab 11/14/20 1525  LIPASE 99*   Recent Labs  Lab 11/16/20 0908 11/17/20 0437 11/18/20 1112 11/19/20 0423 11/20/20 0407  AMMONIA 84* 53* 54* 41* 36*   Diabetic: No results for input(s): HGBA1C in the last 72 hours. Recent Labs  Lab 11/14/20 1527  GLUCAP 86   Cardiac Enzymes: Recent Labs  Lab 11/14/20 1525 11/15/20 0541 11/16/20 0244 11/17/20 0437 11/18/20 1112  CKTOTAL 537* 428* 456* 330* 229   No results for input(s): PROBNP in the last 8760 hours. Coagulation Profile: Recent Labs  Lab 11/14/20 1525 11/16/20 0244 11/17/20 0437 11/19/20 0423  INR 1.2 2.6* 1.0 1.2   Thyroid Function Tests: No results for input(s): TSH, T4TOTAL, FREET4, T3FREE, THYROIDAB in the last 72 hours. Lipid Profile: No results for input(s): CHOL, HDL, LDLCALC, TRIG, CHOLHDL, LDLDIRECT in the last 72 hours. Anemia Panel: No results for input(s): VITAMINB12, FOLATE, FERRITIN, TIBC, IRON, RETICCTPCT in the last 72 hours. Urine analysis:    Component Value Date/Time   COLORURINE AMBER (A)  11/14/2020 2040   APPEARANCEUR CLEAR 11/14/2020 2040   LABSPEC 1.027 11/14/2020 2040   PHURINE 8.0 11/14/2020 2040   GLUCOSEU NEGATIVE 11/14/2020 2040   HGBUR SMALL (A) 11/14/2020 2040   BILIRUBINUR NEGATIVE 11/14/2020 2040   KETONESUR NEGATIVE 11/14/2020 2040   PROTEINUR NEGATIVE 11/14/2020 2040   NITRITE NEGATIVE 11/14/2020 2040   LEUKOCYTESUR NEGATIVE 11/14/2020 2040   Sepsis Labs: Invalid input(s): PROCALCITONIN, LACTICIDVEN   Time coordinating discharge: 45 minutes  SIGNED:  Mercy Riding, MD  [facility] 11/20/2020, 7:04 PM  If 7PM-7AM, please contact night-coverage [email or web address]

## 2020-11-20 NOTE — TOC Transition Note (Signed)
Transition of Care [facility]) - CM/SW Discharge Note   Patient Details  Name: Judy Meyer MRN: [id number] Date of Birth: 28-Nov-1960  [facility] CM/SW Contact:  Lennart Pall, LCSW Phone Number: 11/20/2020, 10:32 AM   Clinical Narrative:    Met with pt this a.m. to discuss dc needs.  Per therapies, they are recommending [place] follow up and pt has been resistant to this.  She is now agreeable and orders placed with referral made to [facility].  Pt confirms that she will dc to local address of [place] where she is staying with her boyfriend who can provide support.  Notes she does plan to, eventually, return to her home in [place] and would like for that address to remain on her hospital info.  Pt is aware of local urgent care locations should she need medical assistance, however, not planning to establish local PCP since she will be return to [place] at some point.  No further TOC needs.   Final next level of care: [place] Barriers to Discharge: Barriers Resolved   Patient Goals and CMS Choice Patient states their goals for this hospitalization and ongoing recovery are:: to eventually return to [place] Medicare.gov Compare Post Acute Care list provided to:: Patient Choice offered to / list presented to : Patient  Discharge Placement                       Discharge Plan and Services     Post Acute Care Choice: Home Health          DME Arranged: N/A DME Agency: NA       HH Arranged: PT,OT [place] Agency: [place] Date [facility] Agency Contacted: 11/20/20 Time [place]: [phone] Representative spoke with at [place]: Tommi Rumps B.  Social Determinants of Health (SDOH) Interventions     Readmission Risk Interventions No flowsheet data found.

## 2020-11-20 NOTE — Care Management Important Message (Signed)
Important Message  Patient Details  IM Letter given to the Patient Name: Judy Meyer MRN: [id number] Date of Birth: [birth year]   Medicare Important Message Given:  Yes     Kerin Salen 11/20/2020, 9:44 AM

## 2020-12-29 ENCOUNTER — Other Ambulatory Visit (INDEPENDENT_AMBULATORY_CARE_PROVIDER_SITE_OTHER): Payer: Self-pay | Admitting: Obstetrics & Gynecology

## 2020-12-29 MED ORDER — NORTREL 7/7/7 (28) 0.5 MG/0.75 MG/1 MG-35 MCG TABLET
ORAL_TABLET | ORAL | 3 refills | Status: DC
Start: 2020-12-29 — End: 2021-05-15

## 2020-12-29 NOTE — Telephone Encounter (Signed)
-----   Message from Marlow Baars sent at 12/26/2020  4:34 PM EDT -----  Laurann [place] pt    Patient is needing a refill on NORTREL 7/7/7, 28, 0.5/0.75/1 mg- 35 mcg Oral Tablet. She uses [facility] [place], [place]. She is still in [place] being treated for liver damage. Please call her at [phone]    Thank you  Marlow Baars

## 2020-12-29 NOTE — Telephone Encounter (Signed)
Patient requesting refill for Nortrel. Will pend and route to Dr. Lessie Dings.  Lyn Henri, RN  12/29/2020, 09:15

## 2021-03-16 ENCOUNTER — Inpatient Hospital Stay (HOSPITAL_COMMUNITY)
Admission: EM | Admit: 2021-03-16 | Discharge: 2021-03-20 | DRG: 100 | Disposition: A | Discharge status: Home Health | Payer: Medicare PPO | Attending: Internal Medicine | Admitting: Internal Medicine

## 2021-03-16 ENCOUNTER — Emergency Department (HOSPITAL_COMMUNITY): Payer: Medicare PPO

## 2021-03-16 ENCOUNTER — Encounter (HOSPITAL_COMMUNITY): Payer: Self-pay

## 2021-03-16 ENCOUNTER — Other Ambulatory Visit: Payer: Self-pay

## 2021-03-16 DIAGNOSIS — Z79890 Hormone replacement therapy (postmenopausal): Secondary | ICD-10-CM

## 2021-03-16 DIAGNOSIS — G40901 Epilepsy, unspecified, not intractable, with status epilepticus: Secondary | ICD-10-CM | POA: Diagnosis present

## 2021-03-16 DIAGNOSIS — E872 Acidosis, unspecified: Secondary | ICD-10-CM | POA: Diagnosis present

## 2021-03-16 DIAGNOSIS — G0490 Encephalitis and encephalomyelitis, unspecified: Secondary | ICD-10-CM | POA: Diagnosis present

## 2021-03-16 DIAGNOSIS — G9341 Metabolic encephalopathy: Secondary | ICD-10-CM | POA: Diagnosis present

## 2021-03-16 DIAGNOSIS — N179 Acute kidney failure, unspecified: Secondary | ICD-10-CM | POA: Diagnosis present

## 2021-03-16 DIAGNOSIS — R569 Unspecified convulsions: Secondary | ICD-10-CM | POA: Diagnosis not present

## 2021-03-16 DIAGNOSIS — D638 Anemia in other chronic diseases classified elsewhere: Secondary | ICD-10-CM | POA: Diagnosis present

## 2021-03-16 DIAGNOSIS — R4182 Altered mental status, unspecified: Secondary | ICD-10-CM | POA: Diagnosis not present

## 2021-03-16 DIAGNOSIS — Z8249 Family history of ischemic heart disease and other diseases of the circulatory system: Secondary | ICD-10-CM | POA: Diagnosis not present

## 2021-03-16 DIAGNOSIS — Z888 Allergy status to other drugs, medicaments and biological substances status: Secondary | ICD-10-CM | POA: Diagnosis not present

## 2021-03-16 DIAGNOSIS — E039 Hypothyroidism, unspecified: Secondary | ICD-10-CM | POA: Diagnosis present

## 2021-03-16 DIAGNOSIS — I10 Essential (primary) hypertension: Secondary | ICD-10-CM | POA: Diagnosis present

## 2021-03-16 DIAGNOSIS — Z20822 Contact with and (suspected) exposure to covid-19: Secondary | ICD-10-CM | POA: Diagnosis present

## 2021-03-16 DIAGNOSIS — D539 Nutritional anemia, unspecified: Secondary | ICD-10-CM | POA: Diagnosis present

## 2021-03-16 DIAGNOSIS — K767 Hepatorenal syndrome: Secondary | ICD-10-CM | POA: Diagnosis present

## 2021-03-16 DIAGNOSIS — E876 Hypokalemia: Secondary | ICD-10-CM | POA: Diagnosis present

## 2021-03-16 DIAGNOSIS — G928 Other toxic encephalopathy: Secondary | ICD-10-CM | POA: Diagnosis present

## 2021-03-16 DIAGNOSIS — D72829 Elevated white blood cell count, unspecified: Secondary | ICD-10-CM | POA: Diagnosis present

## 2021-03-16 DIAGNOSIS — Z79899 Other long term (current) drug therapy: Secondary | ICD-10-CM

## 2021-03-16 DIAGNOSIS — R195 Other fecal abnormalities: Secondary | ICD-10-CM | POA: Diagnosis present

## 2021-03-16 DIAGNOSIS — K7200 Acute and subacute hepatic failure without coma: Secondary | ICD-10-CM | POA: Diagnosis present

## 2021-03-16 DIAGNOSIS — R9389 Abnormal findings on diagnostic imaging of other specified body structures: Secondary | ICD-10-CM

## 2021-03-16 DIAGNOSIS — G4089 Other seizures: Secondary | ICD-10-CM | POA: Diagnosis not present

## 2021-03-16 DIAGNOSIS — Z83438 Family history of other disorder of lipoprotein metabolism and other lipidemia: Secondary | ICD-10-CM | POA: Diagnosis not present

## 2021-03-16 DIAGNOSIS — E785 Hyperlipidemia, unspecified: Secondary | ICD-10-CM | POA: Diagnosis present

## 2021-03-16 DIAGNOSIS — E7220 Disorder of urea cycle metabolism, unspecified: Secondary | ICD-10-CM | POA: Diagnosis present

## 2021-03-16 LAB — BASIC METABOLIC PANEL
Anion gap: 22 — ABNORMAL HIGH (ref 5–15)
Anion gap: 13 (ref 5–15)
BUN: 8 mg/dL (ref 6–20)
BUN: 8 mg/dL (ref 6–20)
CO2: 11 mmol/L — ABNORMAL LOW (ref 22–32)
CO2: 16 mmol/L — ABNORMAL LOW (ref 22–32)
Calcium: 9.3 mg/dL (ref 8.9–10.3)
Calcium: 8.5 mg/dL — ABNORMAL LOW (ref 8.9–10.3)
Chloride: 105 mmol/L (ref 98–111)
Chloride: 106 mmol/L (ref 98–111)
Creatinine, Ser: 1.36 mg/dL — ABNORMAL HIGH (ref 0.44–1.00)
Creatinine, Ser: 0.94 mg/dL (ref 0.44–1.00)
GFR, Estimated: 45 mL/min — ABNORMAL LOW (ref >60)
GFR, Estimated: >60 mL/min (ref >60)
Glucose, Bld: 173 mg/dL — ABNORMAL HIGH (ref 70–99)
Glucose, Bld: 111 mg/dL — ABNORMAL HIGH (ref 70–99)
Potassium: 2.3 mmol/L — CL (ref 3.5–5.1)
Potassium: 5.5 mmol/L — ABNORMAL HIGH (ref 3.5–5.1)
Sodium: 138 mmol/L (ref 135–145)
Sodium: 135 mmol/L (ref 135–145)

## 2021-03-16 LAB — URINALYSIS, COMPLETE (UACMP) WITH MICROSCOPIC
Bilirubin Urine: NEGATIVE
Glucose, UA: NEGATIVE mg/dL
Ketones, ur: NEGATIVE mg/dL
Leukocytes,Ua: NEGATIVE
Nitrite: NEGATIVE
Protein, ur: NEGATIVE mg/dL
Specific Gravity, Urine: 1.006 (ref 1.005–1.030)
pH: 7 (ref 5.0–8.0)

## 2021-03-16 LAB — CBC WITH DIFFERENTIAL/PLATELET
Abs Immature Granulocytes: 0.09 10*3/uL — ABNORMAL HIGH (ref 0.00–0.07)
Basophils Absolute: 0 10*3/uL (ref 0.0–0.1)
Basophils Relative: 0 %
Eosinophils Absolute: 0 10*3/uL (ref 0.0–0.5)
Eosinophils Relative: 0 %
HCT: 36.1 % (ref 36.0–46.0)
Hemoglobin: 10.7 g/dL — ABNORMAL LOW (ref 12.0–15.0)
Immature Granulocytes: 1 %
Lymphocytes Relative: 62 %
Lymphs Abs: 12.1 10*3/uL — ABNORMAL HIGH (ref 0.7–4.0)
MCH: 27.2 pg (ref 26.0–34.0)
MCHC: 29.6 g/dL — ABNORMAL LOW (ref 30.0–36.0)
MCV: 91.6 fL (ref 80.0–100.0)
Monocytes Absolute: 0.9 10*3/uL (ref 0.1–1.0)
Monocytes Relative: 5 %
Neutro Abs: 6.1 10*3/uL (ref 1.7–7.7)
Neutrophils Relative %: 32 %
Platelets: 564 10*3/uL — ABNORMAL HIGH (ref 150–400)
RBC: 3.94 MIL/uL (ref 3.87–5.11)
RDW: 15.7 % — ABNORMAL HIGH (ref 11.5–15.5)
WBC: 19.3 10*3/uL — ABNORMAL HIGH (ref 4.0–10.5)
nRBC: 0 % (ref 0.0–0.2)

## 2021-03-16 LAB — I-STAT CHEM 8, ED
BUN: 7 mg/dL (ref 6–20)
Calcium, Ion: 1.11 mmol/L — ABNORMAL LOW (ref 1.15–1.40)
Chloride: 109 mmol/L (ref 98–111)
Creatinine, Ser: 1.1 mg/dL — ABNORMAL HIGH (ref 0.44–1.00)
Glucose, Bld: 168 mg/dL — ABNORMAL HIGH (ref 70–99)
HCT: 32 % — ABNORMAL LOW (ref 36.0–46.0)
Hemoglobin: 10.9 g/dL — ABNORMAL LOW (ref 12.0–15.0)
Potassium: 2.2 mmol/L — CL (ref 3.5–5.1)
Sodium: 141 mmol/L (ref 135–145)
TCO2: 14 mmol/L — ABNORMAL LOW (ref 22–32)

## 2021-03-16 LAB — RESP PANEL BY RT-PCR (FLU A&B, COVID) ARPGX2
Influenza A by PCR: NEGATIVE
Influenza B by PCR: NEGATIVE
SARS Coronavirus 2 by RT PCR: NEGATIVE

## 2021-03-16 LAB — AMMONIA: Ammonia: 110 umol/L — ABNORMAL HIGH (ref 9–35)

## 2021-03-16 LAB — CSF CELL COUNT WITH DIFFERENTIAL
RBC Count, CSF: 1 /mm3 — ABNORMAL HIGH
Tube #: 3
WBC, CSF: 1 /mm3 (ref 0–5)

## 2021-03-16 LAB — PROTIME-INR
INR: 1.1 (ref 0.8–1.2)
Prothrombin Time: 13.8 seconds (ref 11.4–15.2)

## 2021-03-16 LAB — HEPATIC FUNCTION PANEL
ALT: 19 U/L (ref 0–44)
AST: 41 U/L (ref 15–41)
Albumin: 3.6 g/dL (ref 3.5–5.0)
Alkaline Phosphatase: 83 U/L (ref 38–126)
Bilirubin, Direct: 0.2 mg/dL (ref 0.0–0.2)
Indirect Bilirubin: 0.6 mg/dL (ref 0.3–0.9)
Total Bilirubin: 0.8 mg/dL (ref 0.3–1.2)
Total Protein: 7.9 g/dL (ref 6.5–8.1)

## 2021-03-16 LAB — CK: Total CK: 773 U/L — ABNORMAL HIGH (ref 38–234)

## 2021-03-16 LAB — TSH: TSH: 25.66 u[IU]/mL — ABNORMAL HIGH (ref 0.350–4.500)

## 2021-03-16 LAB — RAPID URINE DRUG SCREEN, HOSP PERFORMED
Amphetamines: NOT DETECTED
Barbiturates: NOT DETECTED
Benzodiazepines: POSITIVE — AB
Cocaine: NOT DETECTED
Opiates: NOT DETECTED
Tetrahydrocannabinol: NOT DETECTED

## 2021-03-16 LAB — LACTIC ACID, PLASMA: Lactic Acid, Venous: 1.2 mmol/L (ref 0.5–1.9)

## 2021-03-16 LAB — I-STAT BETA HCG BLOOD, ED (MC, WL, AP ONLY): I-stat hCG, quantitative: <5 m[IU]/mL (ref <5)

## 2021-03-16 LAB — PROTEIN AND GLUCOSE, CSF
Glucose, CSF: 87 mg/dL — ABNORMAL HIGH (ref 40–70)
Total  Protein, CSF: 38 mg/dL (ref 15–45)

## 2021-03-16 LAB — T4, FREE: Free T4: 0.78 ng/dL (ref 0.61–1.12)

## 2021-03-16 LAB — ETHANOL: Alcohol, Ethyl (B): <10 mg/dL (ref <10)

## 2021-03-16 LAB — FOLATE: Folate: 8.6 ng/mL (ref >5.9)

## 2021-03-16 LAB — MAGNESIUM: Magnesium: 2.1 mg/dL (ref 1.7–2.4)

## 2021-03-16 LAB — CBG MONITORING, ED: Glucose-Capillary: 177 mg/dL — ABNORMAL HIGH (ref 70–99)

## 2021-03-16 IMAGING — CT CT ABD-PELV W/O CM
2 of 4 series · 16 of 46 positions shown, 18 images · non-contrast
Comparison: CT abdomen and pelvis dated [REDACTED] [U1]

CLINICAL DATA: Abdominal pain

EXAM:
CT ABDOMEN AND PELVIS WITHOUT CONTRAST
TECHNIQUE: Multidetector CT imaging of the abdomen and pelvis was performed
following the standard protocol without IV contrast.

[Series 3: a/p w/o 5mm · axial · non-contrast · 0.85mm/px · z∈[-564,-139]mm · 13 of 93 slices shown, 15 images]
[im 4/93  soft-tissue]
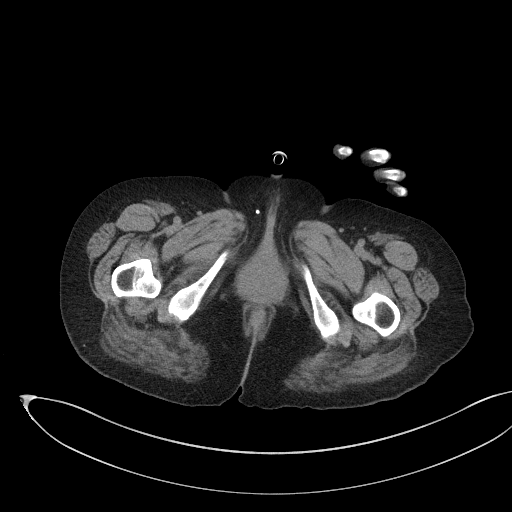
[im 4/93  bone]
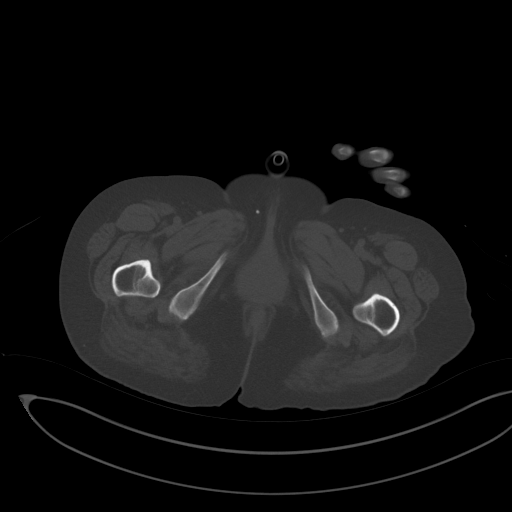
[im 12/93  soft-tissue]
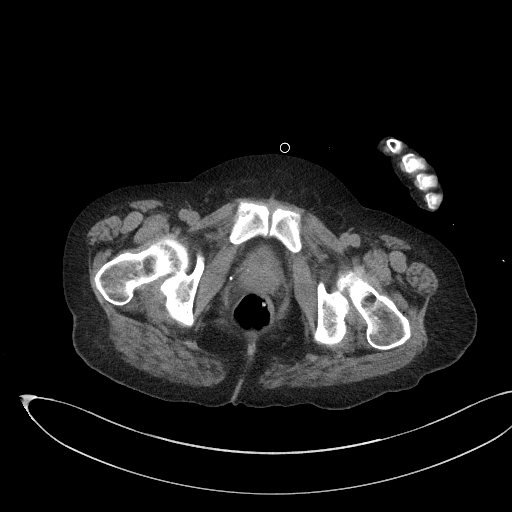
[im 19/93  soft-tissue]
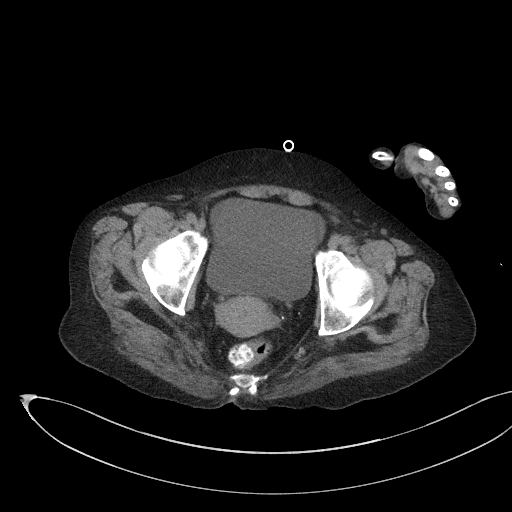
[im 26/93  soft-tissue]
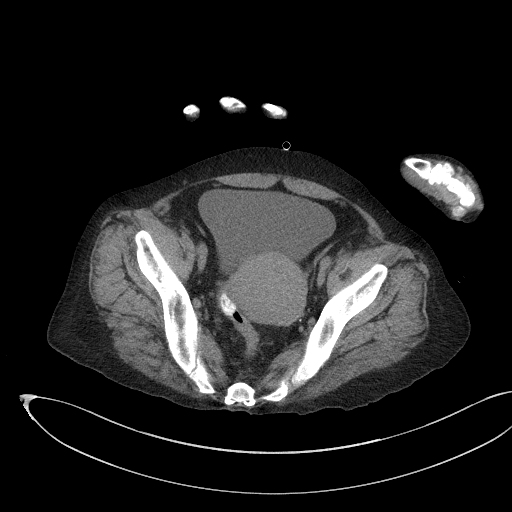
[im 34/93  soft-tissue]
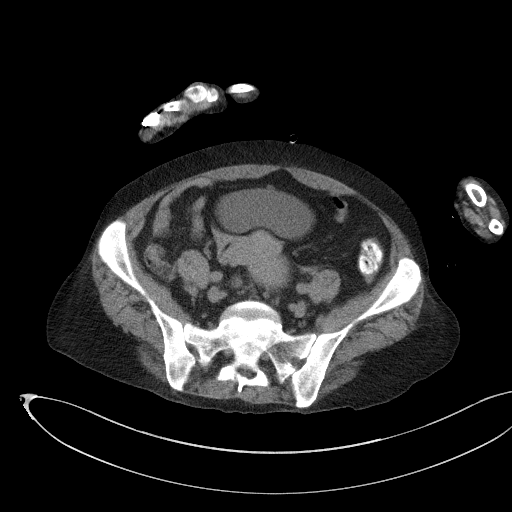
[im 41/93  soft-tissue]
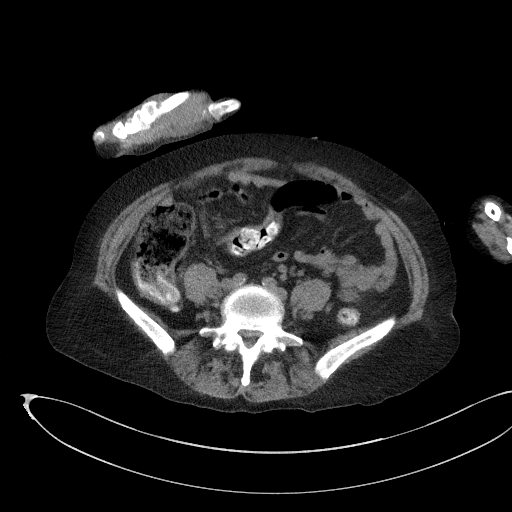
[im 48/93  soft-tissue]
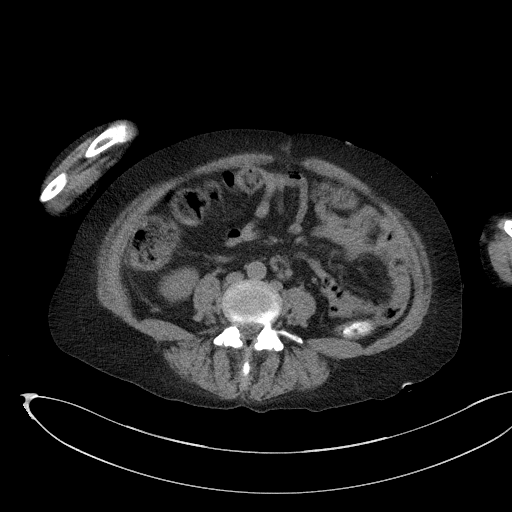
[im 52/93  soft-tissue]
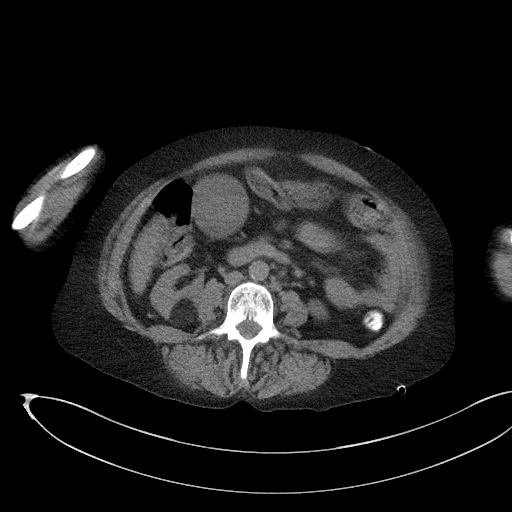
[im 59/93  soft-tissue]
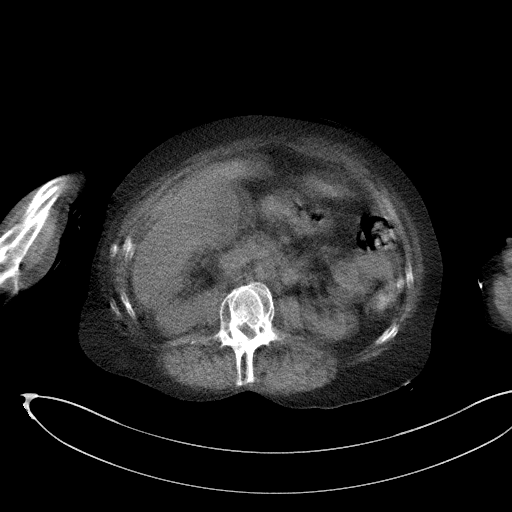
[im 59/93  bone]
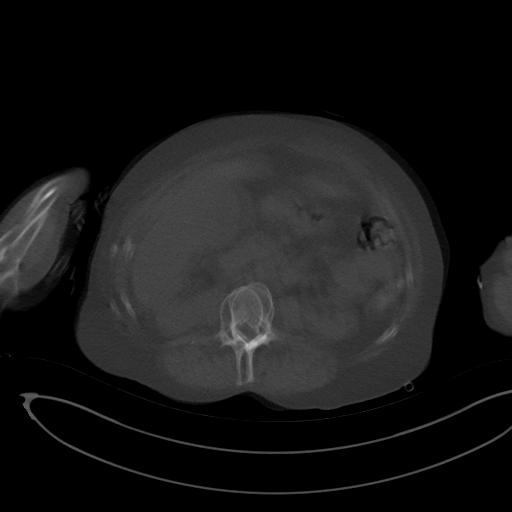
[im 67/93  soft-tissue]
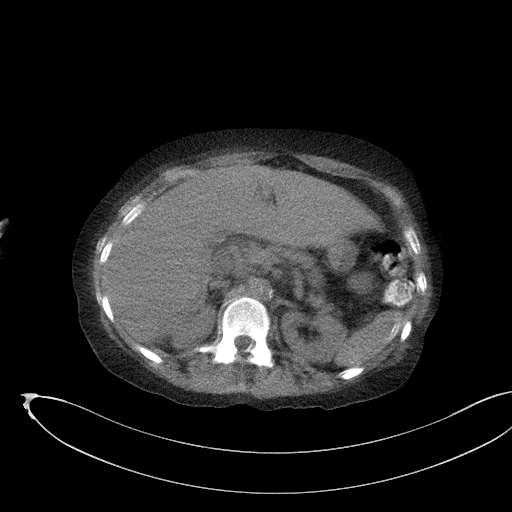
[im 74/93  soft-tissue]
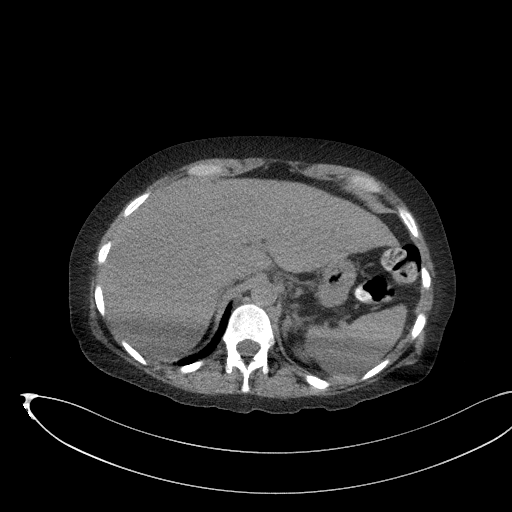
[im 81/93  soft-tissue]
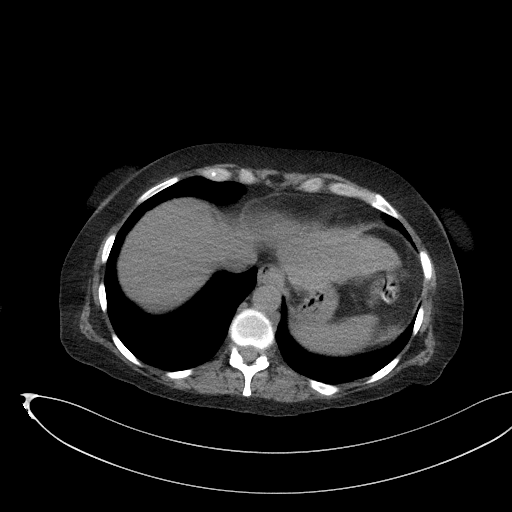
[im 89/93  soft-tissue]
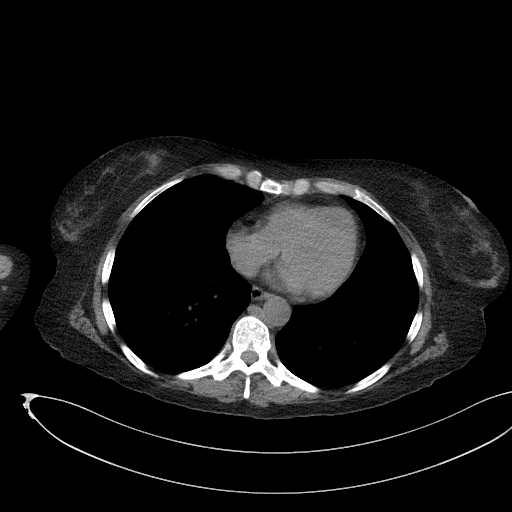

[Series 6: a/p w/o cor · coronal · non-contrast · 0.76mm/px · 3 of 125 slices shown]
[im 42/125  soft-tissue]
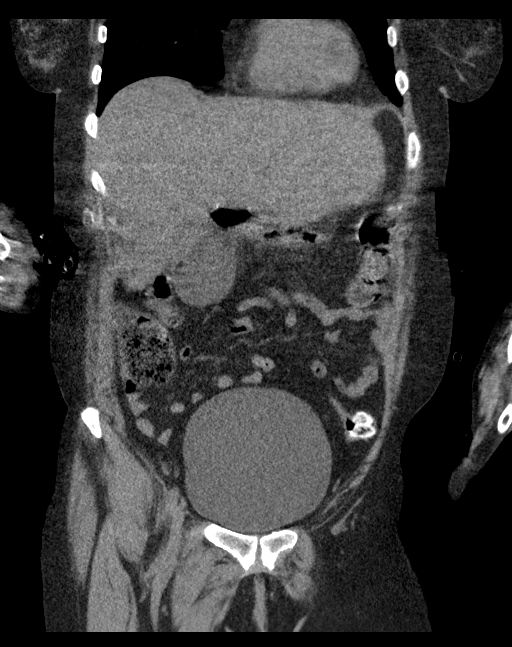
[im 56/125  soft-tissue]
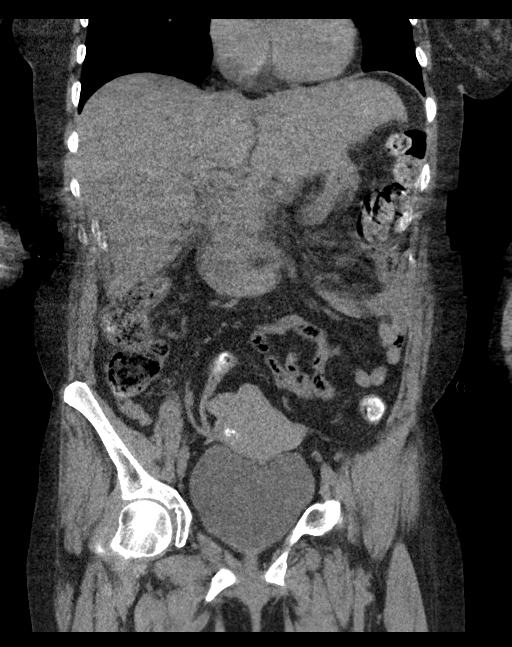
[im 69/125  soft-tissue]
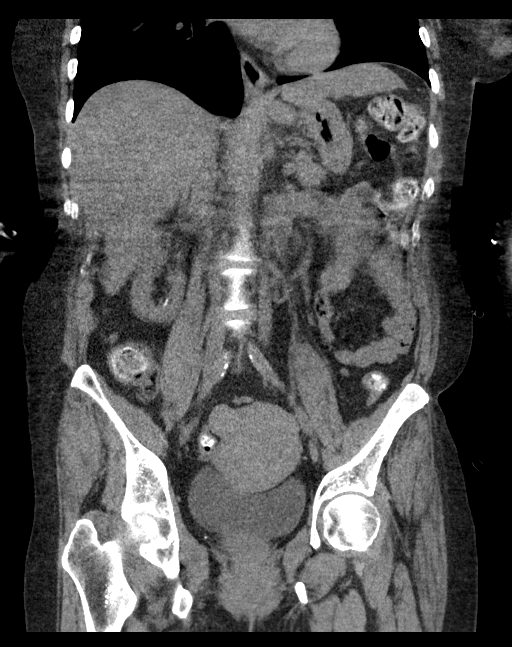

[16 of 46 positions shown; findings below may reference images not displayed]

FINDINGS: Lower chest: No acute abnormality.

Hepatobiliary: No suspicious focal liver lesions. Gallbladder is
distended, unchanged compared to prior CT. Limited evaluation of the
bile ducts due to motion artifact.

Pancreas: Limited evaluation of the pancreatic head due to motion
artifact. No evidence of inflammatory change surrounding the
pancreatic tail.

Spleen: Normal in size without focal abnormality.

Adrenals/Urinary Tract: No hydronephrosis. Punctate nonobstructing
stone in the lower pole of the right kidney. Region of the lower
pole of the right kidney with macroscopic fat, likely an
angiomyolipoma. Normal bladder.

Stomach/Bowel: No evidence of obstruction. Limited evaluation for
bowel wall thickening, especially in the upper abdomen due to motion
artifact. Normal appendix.

Vascular/Lymphatic: Aortic atherosclerosis. No enlarged abdominal or
pelvic lymph nodes.

Reproductive: Fibroid uterus.

Other: No abdominal wall hernia or abnormality. No abdominopelvic
ascites.

Musculoskeletal: No acute or significant osseous findings.
IMPRESSION: Limited exam due to motion artifact, per tech report best images
obtainable.

No CT findings to explain abdominal pain.

Hydropic gallbladder, unchanged compared to prior CT.

Fibroid uterus.

## 2021-03-16 IMAGING — MR MR HEAD W/O CM
6 of 12 series · 27 of 48 positions shown · non-contrast
Comparison: Prior head CT examinations [DATE] and earlier.

CLINICAL DATA: Seizure, abnormal neuro exam.

EXAM:
MRI HEAD WITHOUT CONTRAST
TECHNIQUE: Multiplanar, multiecho pulse sequences of the brain and surrounding
structures were obtained without intravenous contrast.

[Series 2: DWI · axial · 3.0mm · 0.94mm/px · z∈[-56,+83]mm · 9 of 96 slices shown (1 of 2)]
[im 1/96]
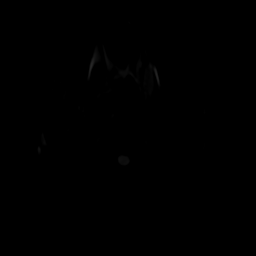
[im 12/96]
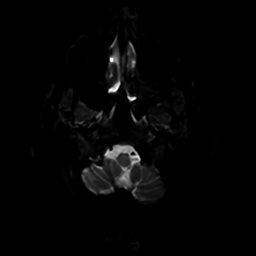
[im 24/96]
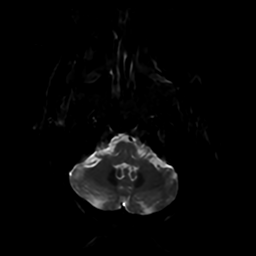
[im 36/96]
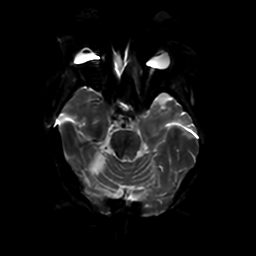
[im 48/96]
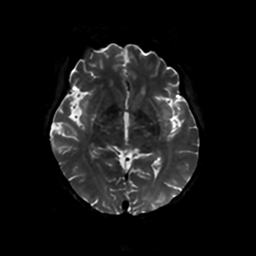
[im 60/96]
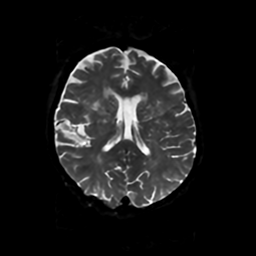
[im 72/96]
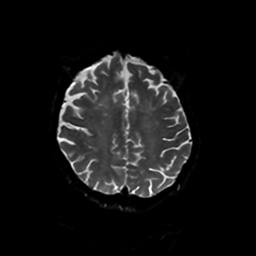
[im 84/96]
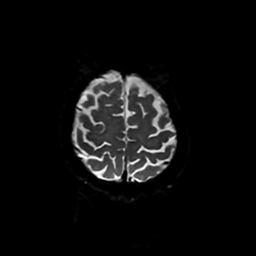
[im 96/96]
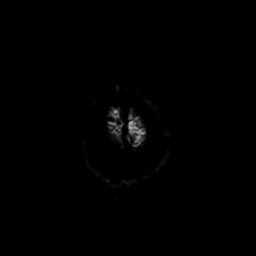

[Series 3: DWI · coronal · 4.0mm · 0.94mm/px · 7 of 68 slices shown (2 of 2)]
[im 1/68]
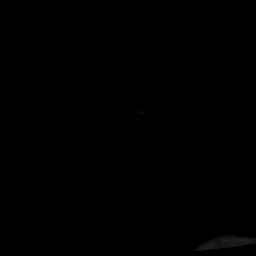
[im 12/68]
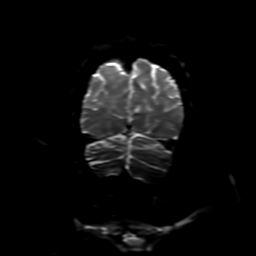
[im 23/68]
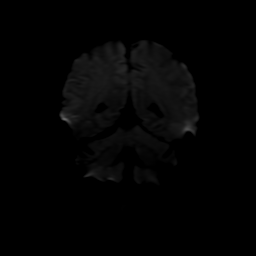
[im 34/68]
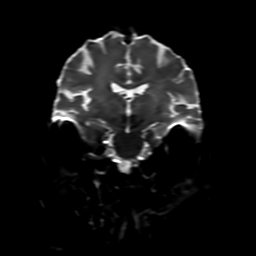
[im 45/68]
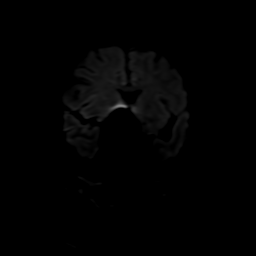
[im 56/68]
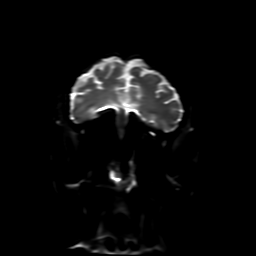
[im 68/68]
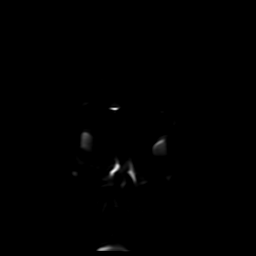

[Series 4: FLAIR · sagittal · 5.0mm · 0.23mm/px · 2 of 23 slices shown (1 of 3)]
[im 1/23]
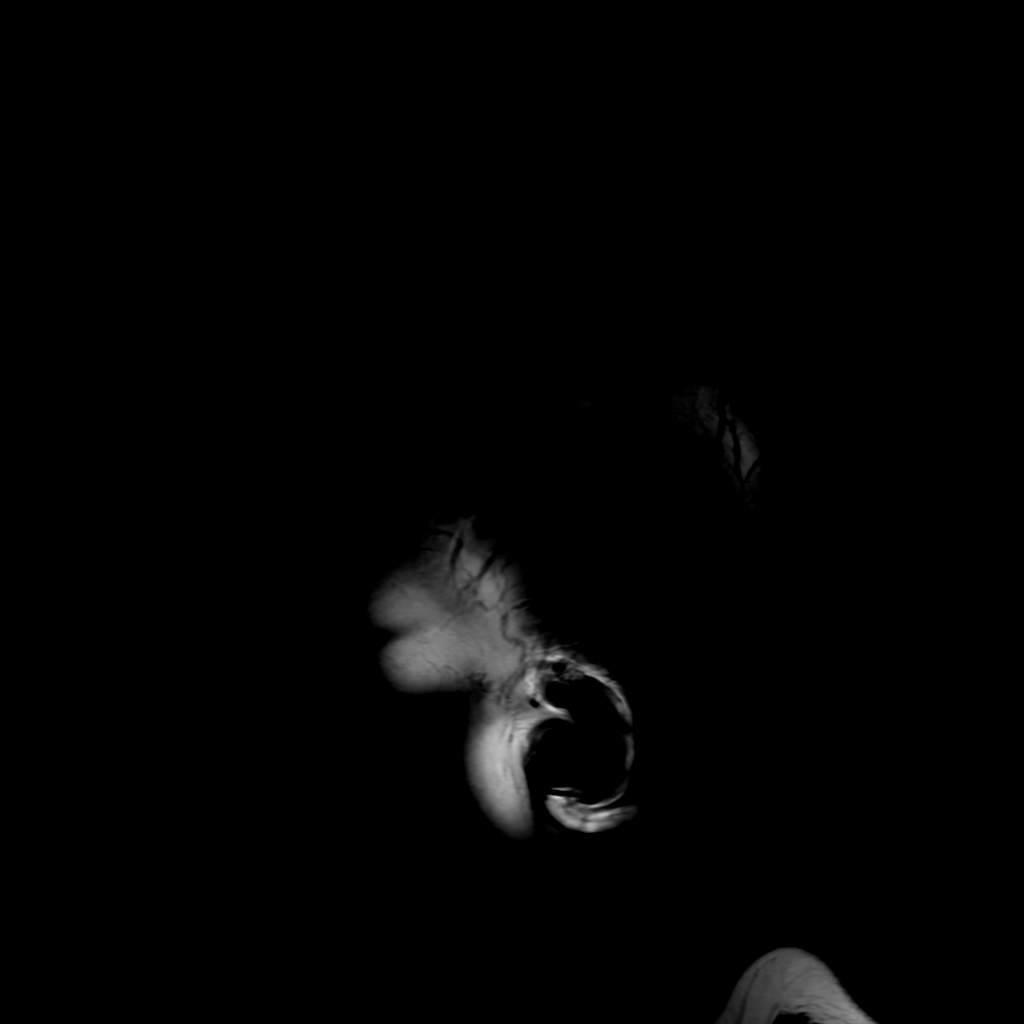
[im 23/23]
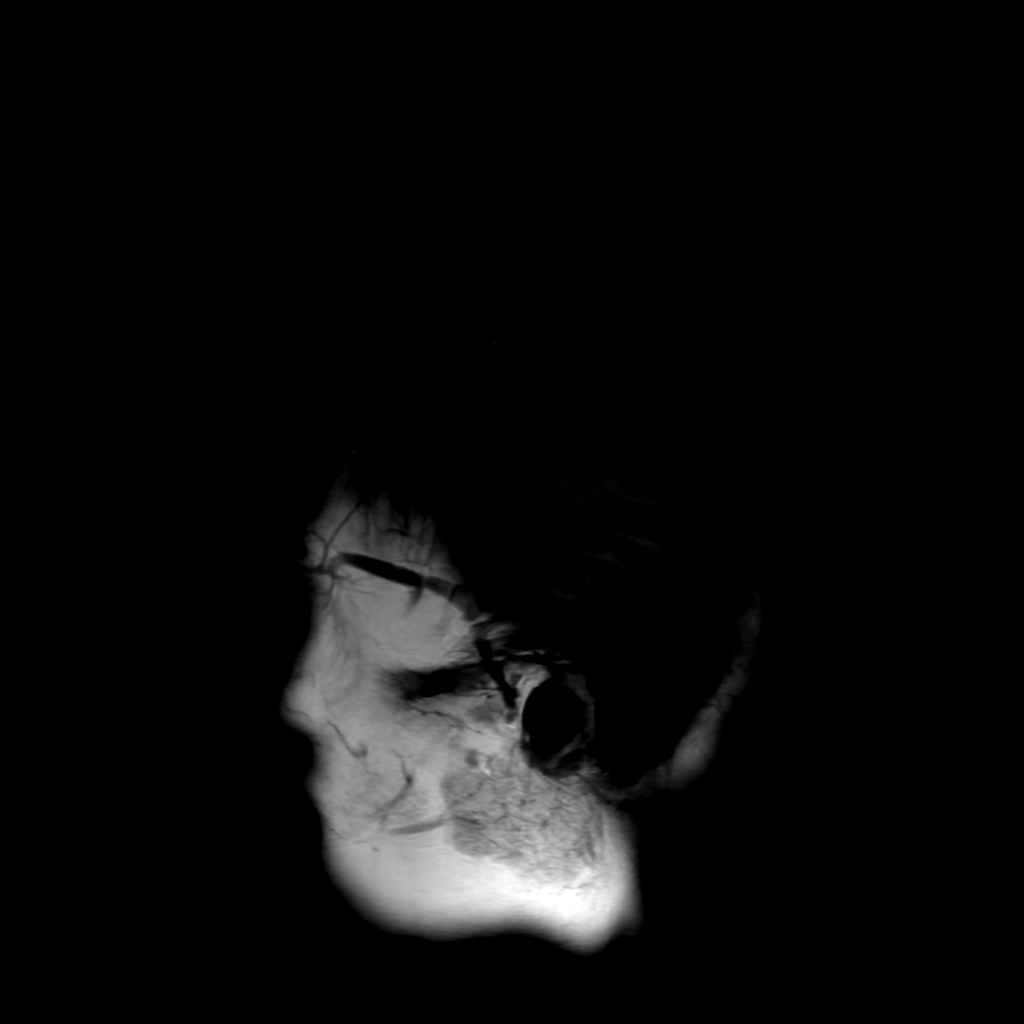

[Series 6: FLAIR · axial · 4.0mm · 0.45mm/px · z∈[-56,+82]mm · 3 of 33 slices shown (2 of 3)]
[im 1/33]
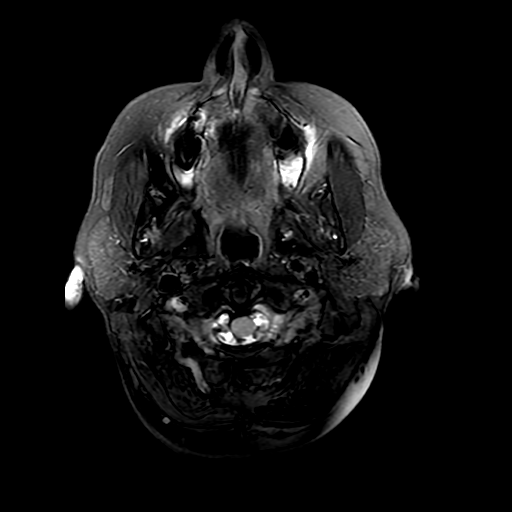
[im 17/33]
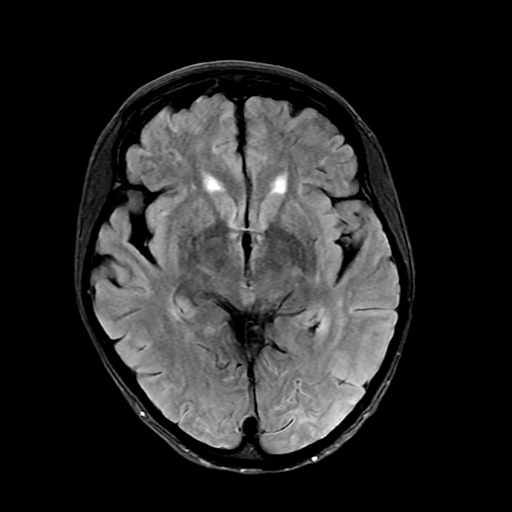
[im 33/33]
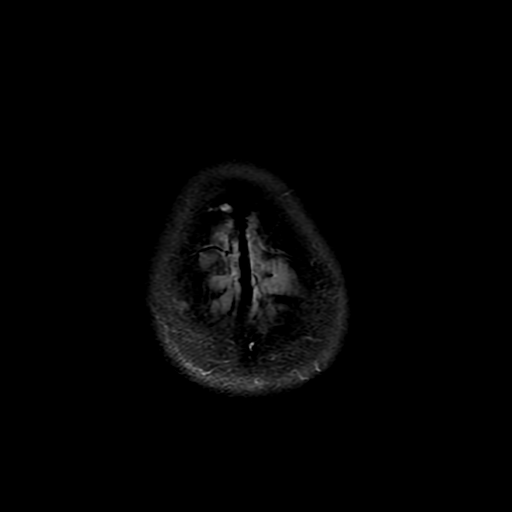

[Series 10: FLAIR · coronal · 3.0mm · 0.39mm/px · 2 of 28 slices shown (3 of 3)]
[im 1/28]
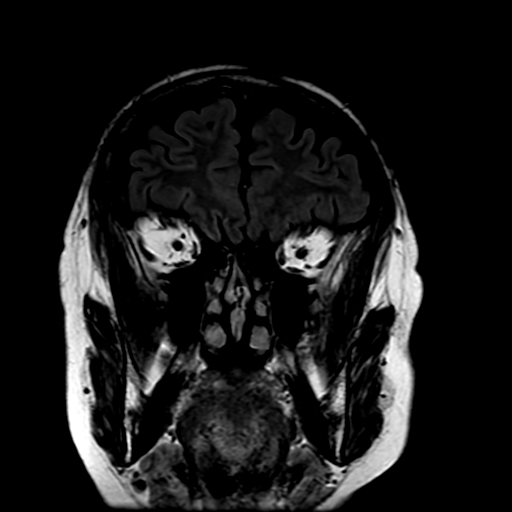
[im 28/28]
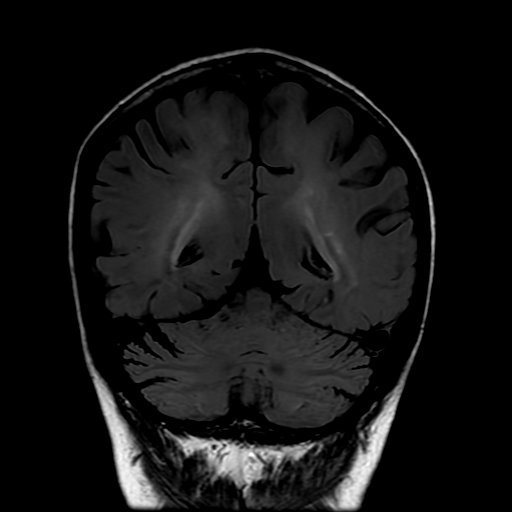

[Series 250: ADC · axial · 3.0mm · 0.94mm/px · z∈[-56,+83]mm · 4 of 45 slices shown]
[im 1/45]
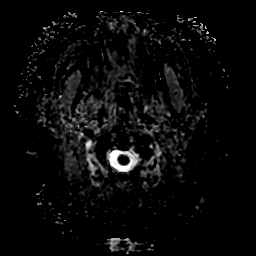
[im 15/45]
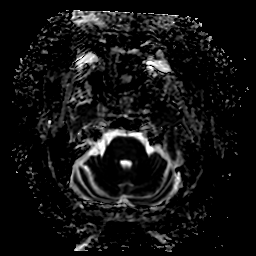
[im 30/45]
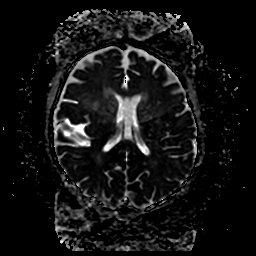
[im 45/45]
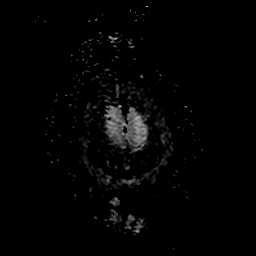

[27 of 48 positions shown; findings below may reference images not displayed]

FINDINGS: Brain:

Mild intermittent motion degradation.

Mild generalized cerebral and cerebellar atrophy.

There is diffusion weighted signal abnormality within the medial
right temporal lobe/hippocampus, extending posteriorly to the crus
of the fornix (for instance as seen on series 2, images 21-26).
Subtle asymmetric T2/FLAIR hyperintensity is also questioned at this
site. No appreciable asymmetric hippocampal volume loss.

Moderate and age-advanced multifocal T2/FLAIR hyperintensity within
the cerebral white matter, nonspecific but most often secondary to
chronic small vessel ischemia.

No chronic intracranial blood products.

No extra-axial fluid collection.

No midline shift.

Incidentally noted cavum septum pellucidum and cavum vergae.

Vascular: Maintained proximal large arterial flow voids.

Skull and upper cervical spine: No focal marrow lesion

Sinuses/Orbits: Visualized orbits show no acute finding. No
significant paranasal sinus disease at the imaged levels.

Impression #2 will be called to the ordering clinician or
representative by the Radiologist Assistant, and communication
documented in the PACS or [REDACTED].
IMPRESSION: Mildly motion degraded exam.

Diffusion-weighted and possible T2/FLAIR signal abnormality within
the medial right temporal lobe/hippocampus and crus of fornix.
Primary considerations are acute signal changes related to recent
seizure and/or signal changes related to acute encephalitis
(including BRACK TIGER encephalitis). Correlate clinically, and with CSF
analysis as warranted. Additionally, short-interval MRI follow-up is
recommended to ensure resolution of this signal abnormality, and to
exclude an underlying mass/lesion at this site.

Moderate and age-advanced multifocal T2/FLAIR hyperintense signal
changes within the cerebral white matter, nonspecific but most often
secondary to chronic small vessel ischemia.

Mild generalized parenchymal atrophy.

## 2021-03-16 IMAGING — CT CT HEAD W/O CM
4 of 5 series · 16 of 47 positions shown, 18 images · non-contrast
Comparison: CT head [DATE]

CLINICAL DATA: Mental status change, unknown cause seizure, altered
mental status

EXAM:
CT HEAD WITHOUT CONTRAST
TECHNIQUE: Contiguous axial images were obtained from the base of the skull
through the vertex without intravenous contrast.

[Series 3: head without · axial · non-contrast · 0.42mm/px · z∈[+1404,+1504]mm · 5 of 31 slices shown, 7 images]
[im 6/31  brain]
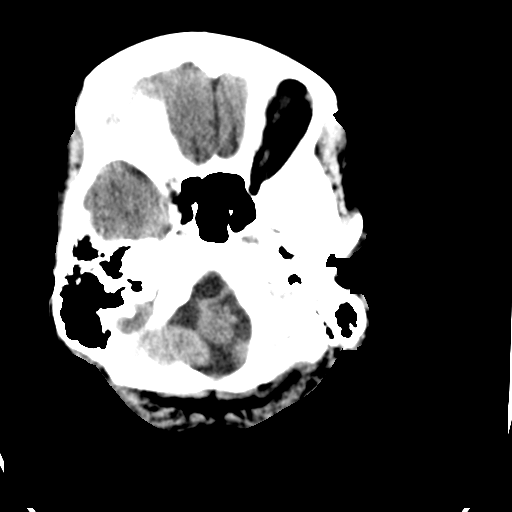
[im 6/31  bone]
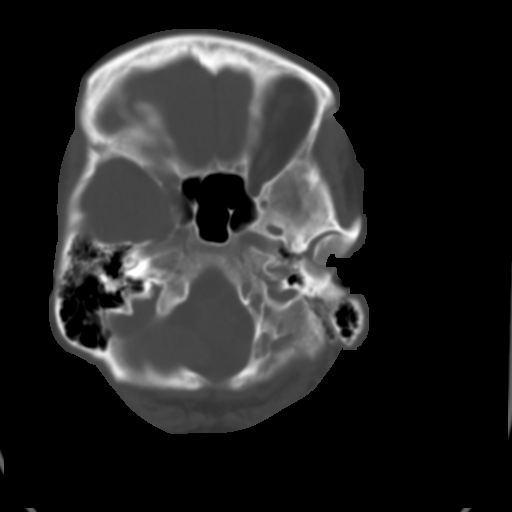
[im 11/31  brain]
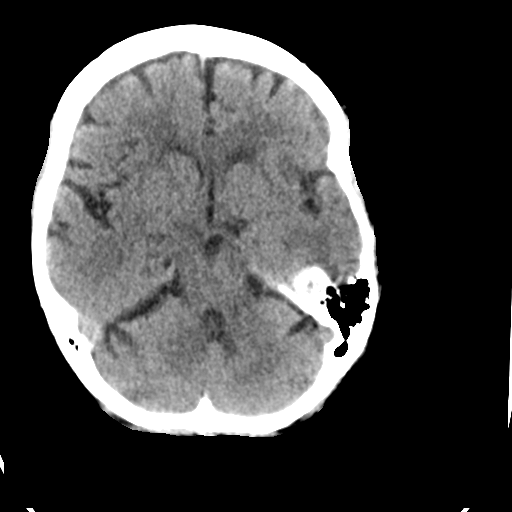
[im 16/31  brain]
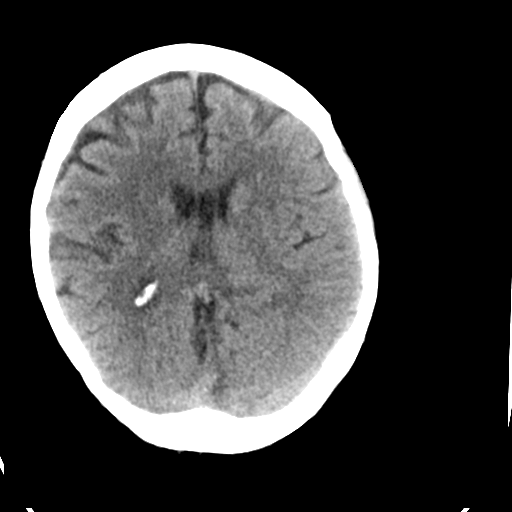
[im 21/31  brain]
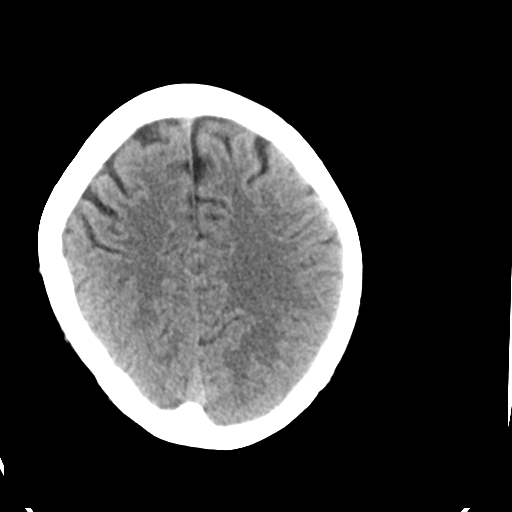
[im 26/31  brain]
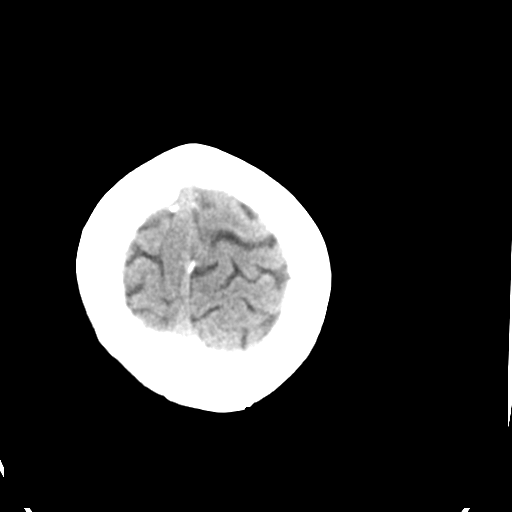
[im 26/31  bone]
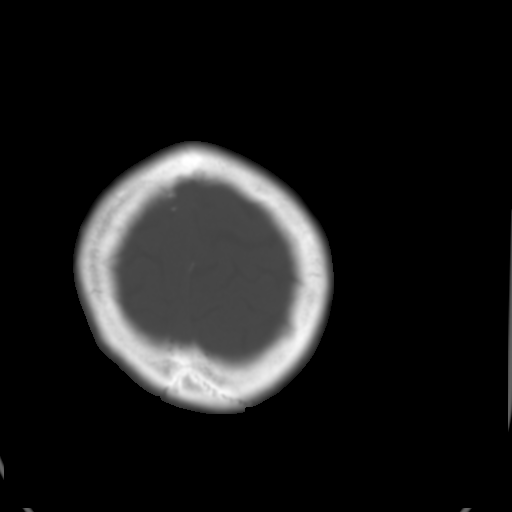

[Series 4: head without ax · axial · non-contrast · 0.40mm/px · z∈[+1411,+1509]mm · 5 of 32 slices shown]
[im 6/32  brain]
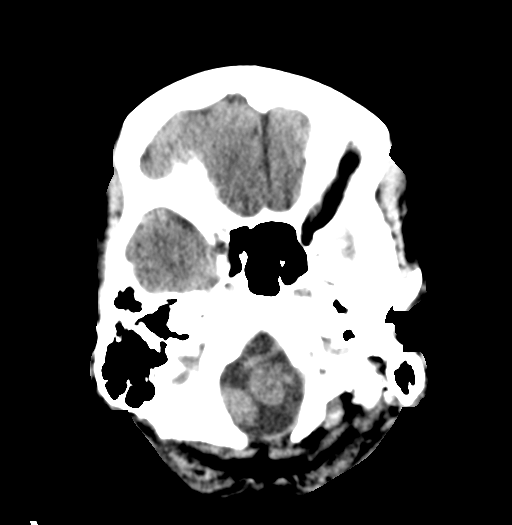
[im 11/32  brain]
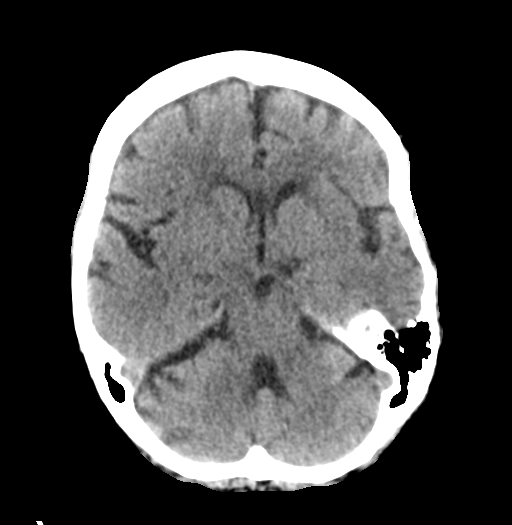
[im 16/32  brain]
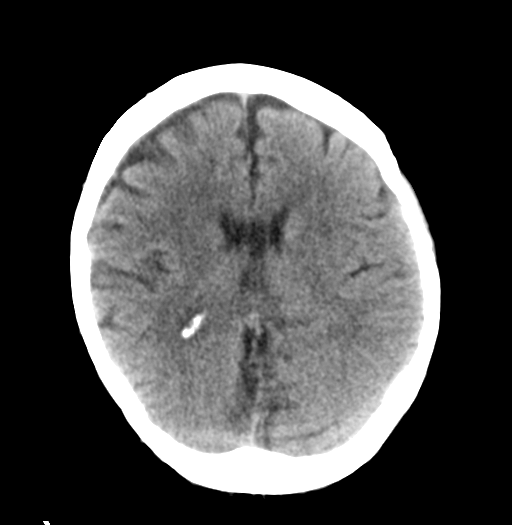
[im 21/32  brain]
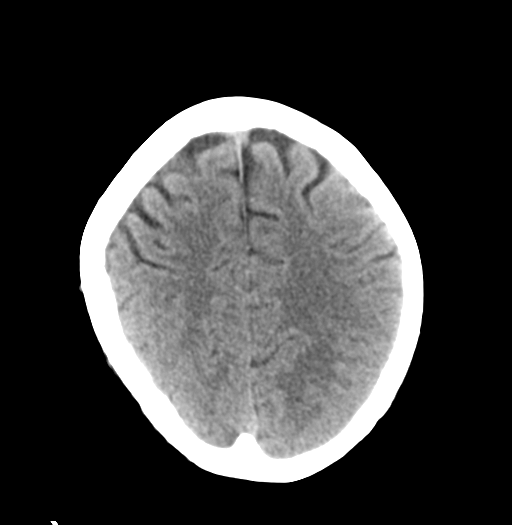
[im 26/32  brain]
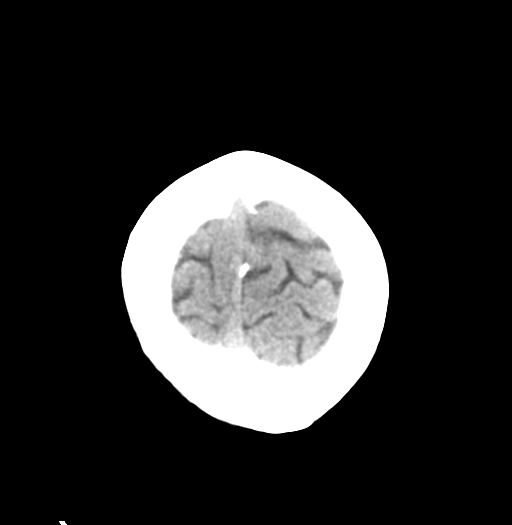

[Series 6: head without cor · coronal · non-contrast · 0.33mm/px · 3 of 60 slices shown]
[im 20/60  brain]
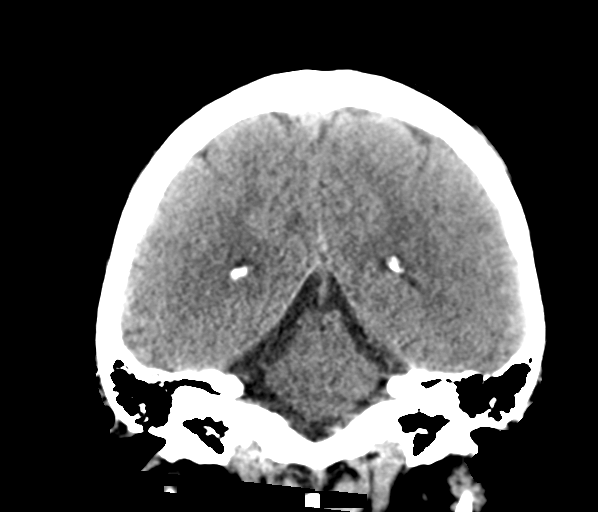
[im 27/60  brain]
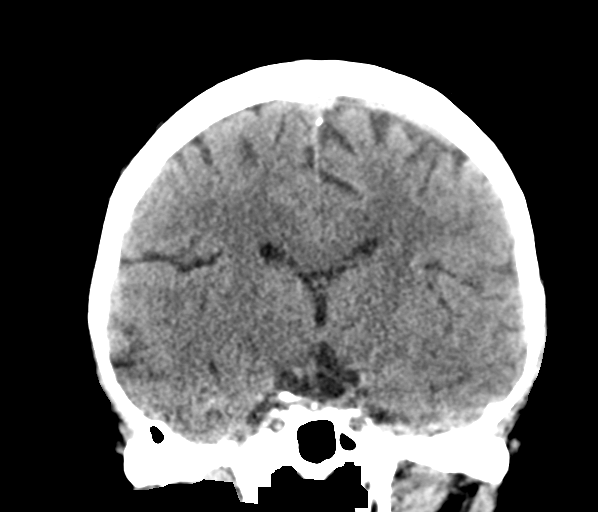
[im 33/60  brain]
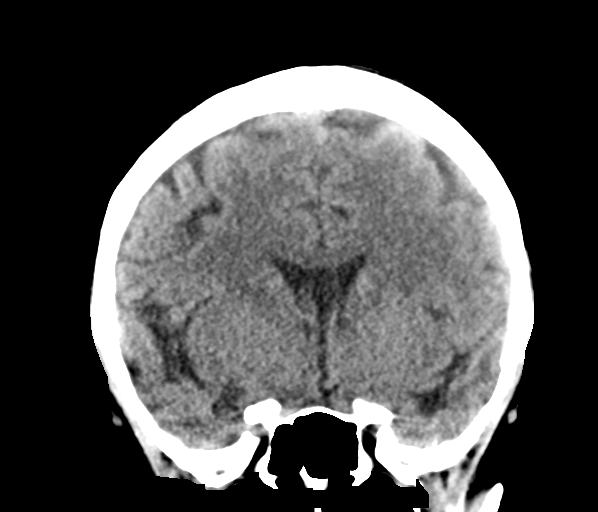

[Series 7: head without sag · sagittal · non-contrast · 0.30mm/px · 3 of 55 slices shown]
[im 19/55  brain]
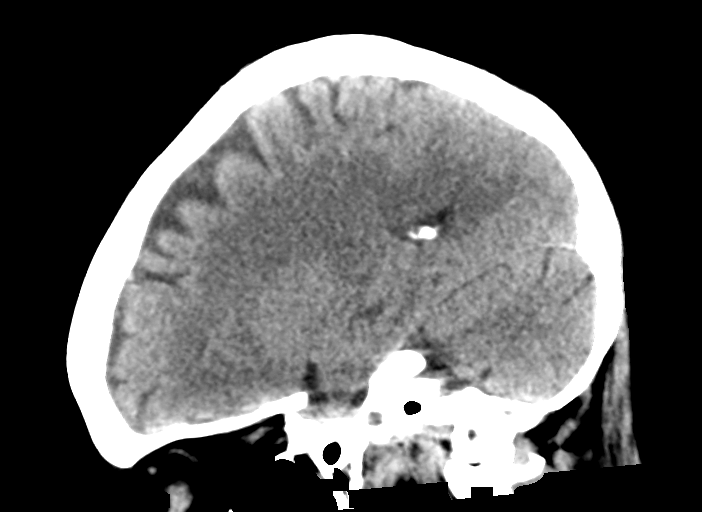
[im 28/55  brain]
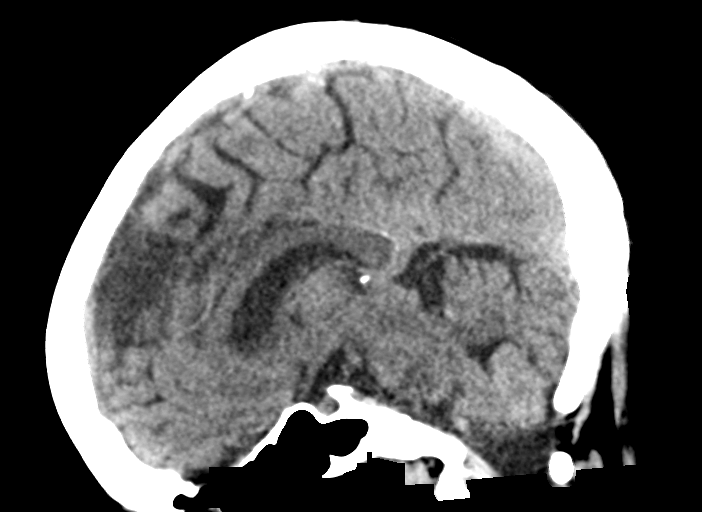
[im 37/55  brain]
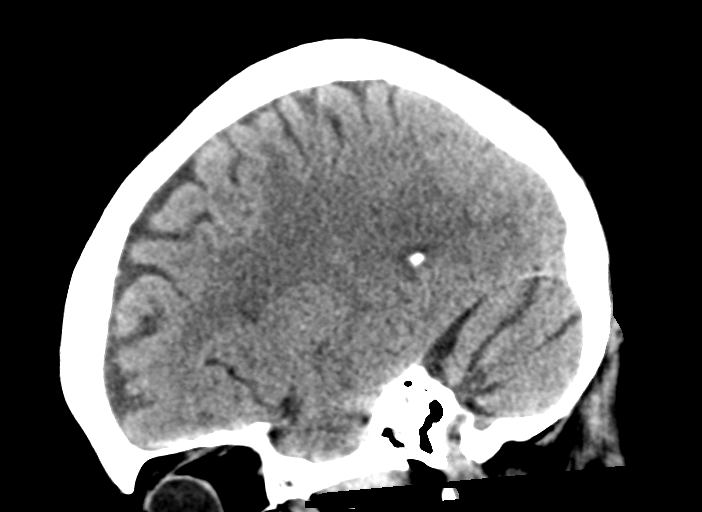

[16 of 47 positions shown; findings below may reference images not displayed]

FINDINGS: Brain: There is no evidence of acute intracranial hemorrhage,
extra-axial fluid collection, or infarct. The ventricles are not
enlarged. There is no midline shift. There is no mass lesion.
Incidental note is made of a cavum septum pellucidum.

Vascular: No hyperdense vessel or unexpected calcification.

Skull: Normal. Negative for fracture or focal lesion.

Sinuses/Orbits: The imaged paranasal sinuses are clear. The imaged
orbits and globes are unremarkable.

Other: The mastoid air cells are clear.
IMPRESSION: No acute intracranial pathology or epileptogenic focus identified.

## 2021-03-16 MED ORDER — HYDROCODONE-ACETAMINOPHEN 5-325 MG PO TABS
1.0000 | ORAL_TABLET | ORAL | Status: DC | PRN
  Administered 2021-03-16 – 2021-03-17 (×3): 1 via ORAL
  Filled 2021-03-16 (×3): qty 1

## 2021-03-16 MED ORDER — ACETAMINOPHEN 325 MG PO TABS
650.0000 mg | ORAL_TABLET | Freq: Four times a day (QID) | ORAL | Status: DC | PRN
  Administered 2021-03-17 – 2021-03-19 (×2): 650 mg via ORAL
  Filled 2021-03-16 (×3): qty 2

## 2021-03-16 MED ORDER — LORAZEPAM 2 MG/ML IJ SOLN: 0.5000 mg | Freq: Once | INTRAMUSCULAR | Status: DC

## 2021-03-16 MED ORDER — ONDANSETRON HCL 4 MG/2ML IJ SOLN: 4.0000 mg | Freq: Once | INTRAMUSCULAR | Status: DC

## 2021-03-16 MED ORDER — LEVETIRACETAM IN NACL 1500 MG/100ML IV SOLN
1500.0000 mg | Freq: Once | INTRAVENOUS | Status: AC
  Administered 2021-03-16: 1500 mg via INTRAVENOUS
  Filled 2021-03-16: qty 100

## 2021-03-16 MED ORDER — ACETAMINOPHEN 650 MG RE SUPP: 650.0000 mg | Freq: Four times a day (QID) | RECTAL | Status: DC | PRN

## 2021-03-16 MED ORDER — ENOXAPARIN SODIUM 40 MG/0.4ML IJ SOSY
40.0000 mg | PREFILLED_SYRINGE | INTRAMUSCULAR | Status: DC
  Administered 2021-03-16 – 2021-03-19 (×4): 40 mg via SUBCUTANEOUS
  Filled 2021-03-16 (×4): qty 0.4

## 2021-03-16 MED ORDER — SODIUM CHLORIDE 0.9 % IV SOLN: INTRAVENOUS | Status: DC

## 2021-03-16 MED ORDER — LACTULOSE ENEMA
300.0000 mL | Freq: Two times a day (BID) | ORAL | Status: DC
  Administered 2021-03-16 (×2): 300 mL via RECTAL
  Filled 2021-03-16 (×4): qty 300

## 2021-03-16 MED ORDER — SODIUM CHLORIDE 0.9 % IV BOLUS
1000.0000 mL | Freq: Once | INTRAVENOUS | Status: AC
  Administered 2021-03-16: 1000 mL via INTRAVENOUS

## 2021-03-16 MED ORDER — DEXTROSE 5 % IV SOLN
10.0000 mg/kg | Freq: Two times a day (BID) | INTRAVENOUS | Status: DC
  Administered 2021-03-16: 775 mg via INTRAVENOUS
  Filled 2021-03-16 (×2): qty 15.5

## 2021-03-16 MED ORDER — POTASSIUM CHLORIDE 10 MEQ/100ML IV SOLN
10.0000 meq | INTRAVENOUS | Status: AC
  Administered 2021-03-16 (×2): 10 meq via INTRAVENOUS
  Filled 2021-03-16 (×2): qty 100

## 2021-03-16 MED ORDER — MORPHINE SULFATE (PF) 2 MG/ML IV SOLN
2.0000 mg | INTRAVENOUS | Status: DC | PRN
Start: 2021-03-16 — End: 2021-03-19
  Administered 2021-03-17 – 2021-03-18 (×2): 2 mg via INTRAVENOUS
  Filled 2021-03-16 (×2): qty 1

## 2021-03-16 MED ORDER — DEXTROSE 5 % IV SOLN
10.0000 mg/kg | Freq: Three times a day (TID) | INTRAVENOUS | Status: DC
  Administered 2021-03-17 – 2021-03-19 (×7): 775 mg via INTRAVENOUS
  Filled 2021-03-16 (×11): qty 15.5

## 2021-03-16 MED ORDER — ONDANSETRON HCL 4 MG/2ML IJ SOLN
4.0000 mg | Freq: Once | INTRAMUSCULAR | Status: AC
  Administered 2021-03-16: 4 mg via INTRAVENOUS
  Filled 2021-03-16: qty 2

## 2021-03-16 MED ORDER — LIDOCAINE HCL (PF) 1 % IJ SOLN
5.0000 mL | Freq: Once | INTRAMUSCULAR | Status: AC
  Administered 2021-03-16: 5 mL
  Filled 2021-03-16: qty 5

## 2021-03-16 MED ORDER — POTASSIUM CHLORIDE 10 MEQ/100ML IV SOLN
10.0000 meq | INTRAVENOUS | Status: AC
Start: 2021-03-16 — End: 2021-03-16
  Administered 2021-03-16 (×3): 10 meq via INTRAVENOUS
  Filled 2021-03-16 (×3): qty 100

## 2021-03-16 MED ORDER — ONDANSETRON HCL 4 MG/2ML IJ SOLN
4.0000 mg | INTRAMUSCULAR | Status: DC | PRN
  Administered 2021-03-17 – 2021-03-20 (×7): 4 mg via INTRAVENOUS
  Filled 2021-03-16 (×7): qty 2

## 2021-03-16 MED ORDER — FENTANYL CITRATE (PF) 100 MCG/2ML IJ SOLN
75.0000 ug | Freq: Once | INTRAMUSCULAR | Status: AC
  Administered 2021-03-16: 75 ug via INTRAVENOUS
  Filled 2021-03-16: qty 2

## 2021-03-16 NOTE — Procedures (Addendum)
Patient Name: Judy Meyer  MRN: [id number]  Epilepsy Attending: Lora Havens  Referring Physician/Provider: Dr Amie Portland Date: 03/16/2021 Duration: 27.21 mins  Patient history: 60 year old woman with history of seizures not on antiepileptics who presented with seizure-like activity.  EEG to evaluate for seizures.  Level of alertness: Awake  AEDs during EEG study: Keppra  Technical aspects: This EEG study was done with scalp electrodes positioned according to the 10-20 International system of electrode placement. Electrical activity was acquired at a sampling rate of '500Hz'$  and reviewed with a high frequency filter of '70Hz'$  and a low frequency filter of '1Hz'$ . EEG data were recorded continuously and digitally stored.   Description: The posterior dominant rhythm consists of 8 Hz activity of moderate voltage (25-35 uV) seen predominantly in posterior head regions, symmetric and reactive to eye opening and eye closing. Lateralized periodic discharges were noted arising from right hemisphere, maximal right frontotemporal region at 1455 with some evolution in frequency.  However routine EEG ended and no definite evolution was noted in morphology or space. Hyperventilation and photic stimulation were not performed.     ABNORMALITY - Lateralized periodic discharges ( LPD ) right hemisphere, maximal right frontotemporal region   IMPRESSION: This study showed evidence of epileptogenicity arising from right hemisphere, maximal right frontotemporal region likely due to underlying structural abnormality with high potential for seizure recurrence.  One episode was noted at 1455 during which lateralized periodic discharges appear to evolve in frequency.  However, routine EEG ended and no definite evolution was noted in morphology or space.   Dr Rory Percy was notified. Recommend long-term monitoring for further evaluation  [name] [name]

## 2021-03-16 NOTE — ED Notes (Signed)
Help gety patient undressed on the monitor did ekg shown to Dr Pearline Cables

## 2021-03-16 NOTE — Progress Notes (Addendum)
ADDENDUM: Repeat labs show improved renal function will increase acyclovir to q8hr for now Lorelei Pont, PharmD, BCPS 03/16/2021 8:40 PM ED Clinical Pharmacist -  [phone]  Pharmacy Antibiotic Note  Judy Meyer is a 60 y.o. female admitted on 03/16/2021 with .  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 Neurology is on board and recommending LP with HSV PCR.  Estimated Creatinine Clearance: 47.3 mL/min (A) (by C-G formula based on SCr of 1.36 mg/dL (H)). - SCr is above baseline for patient.  Plan: Acyclovir 10 mg/kg q12h = '775mg'$  q12h  Weight used pulled from outside [facility] records.  Reconcile weight once admitted to ensure appropriate dosing. NS at 135m/hr Monitor renal function and adjust as appropriate Avoid nephrotoxic agents  Height: '5\' 6"'$  (167.6 cm) IBW/kg (Calculated) : 59.3  Temp (24hrs), Avg:97.6 F (36.4 C), Min:97.6 F (36.4 C), Max:97.6 F (36.4 C)  Recent Labs  Lab 03/16/21 1007 03/16/21 1022  WBC  --  19.3*  CREATININE 1.10* 1.36*    CrCl cannot be calculated (Unknown ideal weight.).    Allergies  Allergen Reactions   Compazine [Prochlorperazine Edisylate] Anaphylaxis   Periactin [Cyproheptadine] Anaphylaxis   Cyclobenzaprine     Palpations    Haloperidol And Related    Metoclopramide Other (See Comments)   Other Other (See Comments)   Prochlorperazine Other (See Comments)    Antimicrobials this admission: acyclovir 8/8 >>   Microbiology results: Pending  Thank you for allowing pharmacy to be a part of this patient's care.  JHeloise Purpura8/03/2021 3:18 PM

## 2021-03-16 NOTE — Progress Notes (Signed)
MRI reviewed.  DWI and possible T2/FLAIR abnormality within the medial right temporal lobe/hippocampus and the crus of the fornix-ongoing seizure activity versus HSV encephalitis or other differentials.  Recommend LP with HSV PCR in addition to cell count, glucose and protein.  Empiric coverage with acyclovir  Loaded with Keppra-continue Keppra 500 twice daily.  EEG also completed-shows right-sided PLEDs. Would recommend continuing on LTM EEG.  Will follow  -- Amie Portland, MD Neurologist [facility] Pager: [phone]

## 2021-03-16 NOTE — ED Provider Notes (Signed)
[facility] EMERGENCY DEPARTMENT Provider Note   CSN: [id number] Arrival date & time: 03/16/21  [id number]     History Chief Complaint  Patient presents with   Seizures    Judy Meyer is a 60 y.o. female.  HPI  Level 5 caveat due to altered mental status  Due to patient's husband around 8 AM this morning patient started jerking around in bed scratched him with her fingernails.  She was not responsive and seem to be convulsing.  He states that the episode lasted for approximately 7 minutes in duration.  It stopped before any medications were administered by EMS who was just arriving on scene.  He states that while she was being moved to stretcher by EMS she was still altered and confused.  Just as EMS was leaving scene patient began seizing again described as tonic-clonic seizure.  Was given 5 mg of Versed by EMS with resolution of seizure.  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 Danae Chen at [facility] although per husband she has not been seen by neurologist in years.  He believes that she is still on Topamax currently is uncertain of dose.  Husband assures me that she has not been drinking alcohol for at least several months.  States that she has a history of benzodiazepine use but is also not used these medications in over 2 months.     Past Medical History:  Diagnosis Date   Hypertension    Hypothyroidism    Liver disease    Uterine fibroid     Patient Active Problem List   Diagnosis Date Noted   Acute metabolic encephalopathy 0000000   Elevated CK 11/15/2020   Bacterial conjunctivitis of both eyes 11/15/2020   Prolonged Q-T interval on ECG 11/14/2020   Intractable vomiting with nausea 11/14/2020   Jaundice    Transaminitis    Hypomagnesemia    Swelling of lower extremity    Folate deficiency    Rhabdomyolysis 07/20/2020   Malnutrition of moderate degree 07/19/2020   Acute lower UTI 07/18/2020    Dehydration 07/18/2020   Hyperbilirubinemia 07/18/2020   Abnormal liver function 07/18/2020   Macrocytic anemia 07/18/2020   Iron excess 07/18/2020   Gastroenteritis 04/07/2020   Nephrolithiasis 04/07/2020   HTN (hypertension) 04/07/2020   Intractable nausea and vomiting 04/06/2020   Depression 04/06/2020   Hypokalemia 04/06/2020   Hypothyroidism 04/06/2020   Hyperlipidemia 04/06/2020    Past Surgical History:  Procedure Laterality Date   hand fracture surgery Right    LIVER BIOPSY       OB History   No obstetric history on file.     Family History  Problem Relation Age of Onset   Hypertension Other    Hypertension Mother    High Cholesterol Mother     Social History   Tobacco Use   Smoking status: Never   Smokeless tobacco: Never  Vaping Use   Vaping Use: Some days   Start date: 09/09/2020  Substance Use Topics   Alcohol use: Not Currently    Comment: last drink was approximately 5 months ago   Drug use: Never    Home Medications Prior to Admission medications   Medication Sig Start Date End Date Taking? Authorizing Provider  albuterol (VENTOLIN HFA) 108 (90 Base) MCG/ACT inhaler Inhale 2 puffs into the lungs 4 (four) times daily as needed for wheezing or shortness of breath. 12/20/19  Yes [provider]  amLODipine (NORVASC) 10 MG tablet Take 1 tablet (10 mg total) by mouth daily. 11/21/20  Yes Mercy Riding, MD  aspirin-acetaminophen-caffeine (EXCEDRIN MIGRAINE) [phone] MG tablet Take 1 tablet by mouth every 6 (six) hours as needed for headache.   Yes [provider]  Galcanezumab-gnlm (EMGALITY) 120 MG/ML SOAJ Inject 120 mg into the skin every 30 (thirty) days.   Yes [provider]  ibuprofen (ADVIL) 200 MG tablet Take 400 mg by mouth every 6 (six) hours as needed for headache (pain).   Yes [provider]  levothyroxine (SYNTHROID) 50 MCG tablet Take 1 tablet (50 mcg total) by mouth daily at 6 (six) AM. 11/21/20  Yes  Mercy Riding, MD  norethindrone-ethinyl estradiol (NORTREL 7/7/7) 0.5/0.75/1-35 MG-MCG tablet Take 1 tablet by mouth daily.   Yes [provider]  ondansetron (ZOFRAN) 4 MG tablet Take 1 tablet (4 mg total) by mouth daily as needed for nausea or vomiting. 11/20/20 11/20/21 Yes Mercy Riding, MD  venlafaxine XR (EFFEXOR-XR) 75 MG 24 hr capsule Take 1 capsule (75 mg total) by mouth daily. 11/20/20  Yes Mercy Riding, MD  cholestyramine light (PREVALITE) 4 g packet Take 1 packet (4 g total) by mouth every 12 (twelve) hours. Patient not taking: Reported on 03/16/2021 11/20/20 12/20/20  Mercy Riding, MD  folic acid (FOLVITE) 1 MG tablet Take 1 tablet (1 mg total) by mouth daily. Patient not taking: No sig reported 11/20/20   Mercy Riding, MD  methocarbamol (ROBAXIN) 500 MG tablet Take 1 tablet (500 mg total) by mouth every 8 (eight) hours as needed for muscle spasms. Patient not taking: No sig reported 11/20/20   Mercy Riding, MD  Multiple Vitamin (MULTIVITAMIN WITH MINERALS) TABS tablet Take 1 tablet by mouth daily. Patient not taking: No sig reported 11/21/20   Mercy Riding, MD  pantoprazole (PROTONIX) 40 MG tablet Take 1 tablet (40 mg total) by mouth daily. Patient not taking: No sig reported 11/20/20   Mercy Riding, MD  sodium bicarbonate 650 MG tablet Take 1 tablet (650 mg total) by mouth 3 (three) times daily. Patient not taking: No sig reported 11/20/20   Mercy Riding, MD  thiamine 100 MG tablet Take 1 tablet (100 mg total) by mouth daily. Patient not taking: No sig reported 11/20/20   Mercy Riding, MD  ursodiol (ACTIGALL) 250 MG tablet Take 1 tablet (250 mg total) by mouth 3 (three) times daily. Patient not taking: No sig reported 11/20/20   Mercy Riding, MD    Allergies    Compazine [prochlorperazine edisylate], Periactin [cyproheptadine], Cyclobenzaprine, Haloperidol and related, and Metoclopramide  Review of Systems   Review of Systems  Unable to perform ROS: Mental status change    Physical Exam Updated Vital Signs BP 137/67   Pulse 97   Temp 97.6 F (36.4 C) (Axillary)   Resp 19   Ht '5\' 6"'$  (1.676 m)   Wt 77.4 kg   LMP 06/11/2018   SpO2 100%   BMI 27.54 kg/m   Physical Exam Vitals and nursing note reviewed.  Constitutional:      General: She is not in acute distress. HENT:     Head: Normocephalic and atraumatic.     Nose: Nose normal.     Mouth/Throat:     Mouth: Mucous membranes are moist.  Eyes:     General: No scleral icterus. Cardiovascular:     Rate and Rhythm: Normal rate and regular rhythm.  Pulses: Normal pulses.     Heart sounds: Normal heart sounds.  Pulmonary:     Effort: Pulmonary effort is normal. No respiratory distress.     Breath sounds: No wheezing.     Comments: Breathing normally.  Respiratory rate slightly depressed rate of approximately 14-16.  Lung sounds are clear to auscultation all fields. Abdominal:     Palpations: Abdomen is soft.     Tenderness: There is no abdominal tenderness.  Musculoskeletal:     Cervical back: Normal range of motion.     Right lower leg: No edema.     Left lower leg: No edema.  Skin:    General: Skin is warm and dry.     Capillary Refill: Capillary refill takes less than 2 seconds.  Neurological:     Mental Status: She is alert.     Comments: Patient is responsive to painful stimuli.  Grunts and opens eyes. Pupils are symmetric and responsive to light bilaterally. Squeezes hands bilaterally at command.  Strength initially seems symmetric but on reevaluation moments later weaker on left.  Is not following commands other than grip strength.  Psychiatric:        Mood and Affect: Mood normal.        Behavior: Behavior normal.    ED Results / Procedures / Treatments   Labs (all labs ordered are listed, but only abnormal results are displayed) Labs Reviewed  BASIC METABOLIC PANEL - Abnormal; Notable for the following components:      Result Value   Potassium 2.3 (*)    CO2 11 (*)     Glucose, Bld 173 (*)    Creatinine, Ser 1.36 (*)    GFR, Estimated 45 (*)    Anion gap 22 (*)    All other components within normal limits  CBC WITH DIFFERENTIAL/PLATELET - Abnormal; Notable for the following components:   WBC 19.3 (*)    Hemoglobin 10.7 (*)    MCHC 29.6 (*)    RDW 15.7 (*)    Platelets 564 (*)    Lymphs Abs 12.1 (*)    Abs Immature Granulocytes 0.09 (*)    All other components within normal limits  RAPID URINE DRUG SCREEN, HOSP PERFORMED - Abnormal; Notable for the following components:   Benzodiazepines POSITIVE (*)    All other components within normal limits  URINALYSIS, COMPLETE (UACMP) WITH MICROSCOPIC - Abnormal; Notable for the following components:   Color, Urine STRAW (*)    Hgb urine dipstick SMALL (*)    Bacteria, UA RARE (*)    All other components within normal limits  AMMONIA - Abnormal; Notable for the following components:   Ammonia 110 (*)    All other components within normal limits  TSH - Abnormal; Notable for the following components:   TSH 25.660 (*)    All other components within normal limits  CBG MONITORING, ED - Abnormal; Notable for the following components:   Glucose-Capillary 177 (*)    All other components within normal limits  I-STAT CHEM 8, ED - Abnormal; Notable for the following components:   Potassium 2.2 (*)    Creatinine, Ser 1.10 (*)    Glucose, Bld 168 (*)    Calcium, Ion 1.11 (*)    TCO2 14 (*)    Hemoglobin 10.9 (*)    HCT 32.0 (*)    All other components within normal limits  RESP PANEL BY RT-PCR (FLU A&B, COVID) ARPGX2  CSF CULTURE W GRAM STAIN  ETHANOL  FOLATE  T4, FREE  MAGNESIUM  PROTIME-INR  HEPATIC FUNCTION PANEL  TOPIRAMATE LEVEL  CSF CELL COUNT WITH DIFFERENTIAL  PROTEIN AND GLUCOSE, CSF  HSV 1/2 PCR, CSF  BASIC METABOLIC PANEL  CBG MONITORING, ED  I-STAT BETA HCG BLOOD, ED ([facility], [facility], [facility] ONLY)  I-STAT BETA HCG BLOOD, ED ([facility], [facility], [facility] ONLY)    EKG None  Radiology CT ABDOMEN PELVIS WO  CONTRAST  Result Date: 03/16/2021 CLINICAL DATA:  Abdominal pain EXAM: CT ABDOMEN AND PELVIS WITHOUT CONTRAST TECHNIQUE: Multidetector CT imaging of the abdomen and pelvis was performed following the standard protocol without IV contrast. COMPARISON:  CT abdomen and pelvis dated April 8th 2022 FINDINGS: Lower chest: No acute abnormality. Hepatobiliary: No suspicious focal liver lesions. Gallbladder is distended, unchanged compared to prior CT. Limited evaluation of the bile ducts due to motion artifact. Pancreas: Limited evaluation of the pancreatic head due to motion artifact. No evidence of inflammatory change surrounding the pancreatic tail. Spleen: Normal in size without focal abnormality. Adrenals/Urinary Tract: No hydronephrosis. Punctate nonobstructing stone in the lower pole of the right kidney. 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 Normal bladder. Stomach/Bowel: No evidence of obstruction. Limited evaluation for bowel wall thickening, especially in the upper abdomen due to motion artifact. Normal appendix. Vascular/Lymphatic: Aortic atherosclerosis. No enlarged abdominal or pelvic lymph nodes. Reproductive: Fibroid uterus. Other: No abdominal wall hernia or abnormality. No abdominopelvic ascites. Musculoskeletal: No acute or significant osseous findings. IMPRESSION: Limited exam due to motion artifact, per tech report best images obtainable. No CT findings to explain abdominal pain. Hydropic gallbladder, unchanged compared to prior CT. Fibroid uterus. Electronically Signed   By: Yetta Glassman MD   On: 03/16/2021 14:52   CT HEAD WO CONTRAST  Result Date: 03/16/2021 CLINICAL DATA:  Mental status change, unknown cause seizure, altered mental status EXAM: CT HEAD WITHOUT CONTRAST TECHNIQUE: Contiguous axial images were obtained from the base of the skull through the vertex without intravenous contrast. COMPARISON:  CT head 12/09/2006 FINDINGS: Brain:  There is no evidence of acute intracranial hemorrhage, extra-axial fluid collection, or infarct. The ventricles are not enlarged. There is no midline shift. There is no mass lesion. Incidental note is made of a cavum septum pellucidum. Vascular: No hyperdense vessel or unexpected calcification. Skull: Normal. Negative for fracture or focal lesion. Sinuses/Orbits: The imaged paranasal sinuses are clear. The imaged orbits and globes are unremarkable. Other: The mastoid air cells are clear. IMPRESSION: No acute intracranial pathology or epileptogenic focus identified. Electronically Signed   By: Valetta Mole MD   On: 03/16/2021 10:39   MR BRAIN WO CONTRAST  Result Date: 03/16/2021 CLINICAL DATA:  Seizure, abnormal neuro exam. EXAM: MRI HEAD WITHOUT CONTRAST TECHNIQUE: Multiplanar, multiecho pulse sequences of the brain and surrounding structures were obtained without intravenous contrast. COMPARISON:  Prior head CT examinations 03/16/2021 and earlier. FINDINGS: Brain: Mild intermittent motion degradation. Mild generalized cerebral and cerebellar atrophy. There is diffusion weighted signal abnormality within the medial right temporal lobe/hippocampus, extending posteriorly to the crus of the fornix (for instance as seen on series 2, images 21-26). Subtle asymmetric T2/FLAIR hyperintensity is also questioned at this site. No appreciable asymmetric hippocampal volume loss. Moderate and age-advanced multifocal T2/FLAIR hyperintensity within the cerebral white matter, nonspecific but most often secondary to chronic small vessel ischemia. No chronic intracranial blood products. No extra-axial fluid collection. No midline shift. Incidentally noted cavum septum pellucidum and cavum vergae. Vascular: Maintained proximal large arterial flow voids. Skull  and upper cervical spine: No focal marrow lesion Sinuses/Orbits: Visualized orbits show no acute finding. No significant paranasal sinus disease at the imaged levels.  Impression #2 will be called to the ordering clinician or representative by the Radiologist Assistant, and communication documented in the PACS or [employer]. IMPRESSION: Mildly motion degraded exam. Diffusion-weighted and possible T2/FLAIR signal abnormality within the medial right temporal lobe/hippocampus and crus of fornix. Primary considerations are acute signal changes related to recent seizure and/or signal changes related to acute encephalitis (including HSV encephalitis). Correlate clinically, and with CSF analysis as warranted. Additionally, short-interval MRI follow-up is recommended to ensure resolution of this signal abnormality, and to exclude an underlying mass/lesion at this site. Moderate and age-advanced multifocal T2/FLAIR hyperintense signal changes within the cerebral white matter, nonspecific but most often secondary to chronic small vessel ischemia. Mild generalized parenchymal atrophy. Electronically Signed   By: Kellie Simmering DO   On: 03/16/2021 14:22   EEG adult  Result Date: 03/16/2021 Lora Havens, MD     03/16/2021  3:31 PM Patient Name: ANGELINNA BRUUN MRN: [id number] Epilepsy Attending: Lora Havens Referring Physician/Provider: Dr Amie [place] Date: 03/16/2021 Duration: 27.21 mins Patient history: 60 year old woman with history of seizures not on antiepileptics who presented with seizure-like activity.  EEG to evaluate for seizures. Level of alertness: Awake AEDs during EEG study: Keppra Technical aspects: This EEG study was done with scalp electrodes positioned according to the 10-20 International system of electrode placement. Electrical activity was acquired at a sampling rate of '500Hz'$  and reviewed with a high frequency filter of '70Hz'$  and a low frequency filter of '1Hz'$ . EEG data were recorded continuously and digitally stored. Description: The posterior dominant rhythm consists of 8 Hz activity of moderate voltage (25-35 uV) seen predominantly in posterior head regions,  symmetric and reactive to eye opening and eye closing. Lateralized periodic discharges were noted arising from right hemisphere, maximal right frontotemporal region at 1455 with some evolution in frequency.  However routine EEG ended and no definite evolution was noted in morphology or space. Hyperventilation and photic stimulation were not performed.   ABNORMALITY - Lateralized periodic discharges ( LPD ) right hemisphere, maximal right frontotemporal region IMPRESSION: This study showed evidence of epileptogenicity arising from right hemisphere, maximal right frontotemporal region likely due to underlying structural abnormality with high potential for seizure recurrence.  One episode was noted at 1455 during which lateralized periodic discharges appear to evolve in frequency.  However, routine EEG ended and no definite evolution was noted in morphology or space. Dr Rory Percy was notified. Recommend long-term monitoring for further evaluation Pablo Pena    Procedures .Lumbar Puncture  Date/Time: 03/16/2021 4:37 PM Performed by: Tedd Sias, PA Authorized by: Tedd Sias, PA   Consent:    Consent obtained:  Verbal   Consent given by:  Patient and spouse   Risks, benefits, and alternatives were discussed: yes     Risks discussed:  Bleeding, infection, pain, repeat procedure, nerve damage and headache   Alternatives discussed:  Referral Universal protocol:    Procedure explained and questions answered to patient or proxy's satisfaction: yes     Relevant documents present and verified: yes     Test results available: yes     Imaging studies available: yes     Required blood products, implants, devices, and special equipment available: yes     Immediately prior to procedure a time out was called: yes     Site/side marked: yes  Patient identity confirmed:  Verbally with patient and arm band Pre-procedure details:    Procedure purpose:  Diagnostic Procedure details:    Lumbar space:   L4-L5 interspace   Patient position:  L lateral decubitus   Needle gauge:  22   Needle type:  Spinal needle - Quincke tip   Needle length (in):  3.5   Ultrasound guidance: no     Number of attempts:  1   Fluid appearance:  Clear   Tubes of fluid:  4   Total volume (ml):  10 Post-procedure details:    Puncture site:  Adhesive bandage applied and direct pressure applied   Procedure completion:  Tolerated well, no immediate complications .Critical Care  Date/Time: 03/16/2021 4:45 PM Performed by: Tedd Sias, PA Authorized by: Tedd Sias, PA   Critical care provider statement:    Critical care time (minutes):  35   Critical care time was exclusive of:  Separately billable procedures and treating other patients and teaching time   Critical care was time spent personally by me on the following activities:  Discussions with consultants, evaluation of patient's response to treatment, examination of patient, review of old charts, re-evaluation of patient's condition, pulse oximetry, ordering and review of radiographic studies, ordering and review of laboratory studies and ordering and performing treatments and interventions   I assumed direction of critical care for this patient from another provider in my specialty: no     Medications Ordered in ED Medications  lactulose (CHRONULAC) enema 200 gm (has no administration in time range)  potassium chloride 10 mEq in 100 mL IVPB (10 mEq Intravenous New Bag/Given 03/16/21 1549)  acyclovir (ZOVIRAX) 775 mg in dextrose 5 % 150 mL IVPB (has no administration in time range)  0.9 %  sodium chloride infusion ( Intravenous New Bag/Given 03/16/21 1637)  sodium chloride 0.9 % bolus 1,000 mL (1,000 mLs Intravenous New Bag/Given 03/16/21 1029)  potassium chloride 10 mEq in 100 mL IVPB (0 mEq Intravenous Stopped 03/16/21 1327)  levETIRAcetam (KEPPRA) IVPB 1500 mg/ 100 mL premix (0 mg Intravenous Stopped 03/16/21 1451)  lidocaine (PF) (XYLOCAINE) 1 %  injection 5 mL (5 mLs Infiltration Given 03/16/21 1625)  ondansetron (ZOFRAN) injection 4 mg (4 mg Intravenous Given 03/16/21 1624)  fentaNYL (SUBLIMAZE) injection 75 mcg (75 mcg Intravenous Given 03/16/21 1620)    ED Course  I have reviewed the triage vital signs and the nursing notes.  Pertinent labs & imaging results that were available during my care of the patient were reviewed by me and considered in my medical decision making (see chart for details).  Clinical Course as of 03/16/21 [place] Mar 16, 2021  1042 Discussed with Dr. Malen Gauze of neuro hospitalist. [WF]  [place] MD is PCP at [facility] [WF]  1207 Discussed with Dr. Neysa Hotter of [facility] - recommended lactulose enema. Orders placed. Appreciate his expert consulation.  [WF]  1235 Ammonia(!): 110 [WF]  1235 Potassium(!!): 2.2 [WF]  1235 Creatinine(!): 1.10 Inc from 3 mo ago [WF]    Clinical Course User Index [WF] Tedd Sias, [place]   MDM Rules/Calculators/A&P                           Patient is a 60 year old female with a remote history of seizures has not had seizures in 10 years presenting to the ER today with altered mental status she had multiple seizures prior to arrival in the ER aborted with  benzodiazepine in route by EMS  Seem to have some left upper extremity weakness seems that her seizures started mostly on left side.  Discussed with neurology who expediently saw her and gave recommendations.  Appreciate their expert consultation.  No acute intracranial abnormality on CT head.  CT abdomen pelvis without contrast somewhat motion graded no acute abnormality.  I obtained this primarily because patient is having some mild abdominal tenderness on my repeat abdominal exam when her mental status has improved.  She does have white count this is very likely stress related from seizure.  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  EEG obtained by neurology.  Ammonia somewhat elevated discussed with  [facility] on-call physician who made recommendations for lactulose initiation via enema.  Patient is severely hypokalemic.  Will replete IV.  TSH mildly elevated but free T4 within normal limits.  Subclinical hypothyroidism not excluded.  Magnesium within normal limits.  LFTs unremarkable.  Urine drug screen only notable for benzos which were given by [place].  Urinalysis not concerning for infection.  Ethanol undetectable.  Lumbar puncture performed personally.  Patient tolerated procedure well.  Patient admitted to hospitalist Dr. Rogers Blocker.    Final Clinical Impression(s) / ED Diagnoses Final diagnoses:  Seizure ([place])  Abnormal MRI  Hypokalemia    Rx / DC Orders ED Discharge Orders     None        Tedd Sias, [place] [id number]    [place], [place] P, DO [phone]

## 2021-03-16 NOTE — Progress Notes (Signed)
EEG complete - results pending 

## 2021-03-16 NOTE — ED Notes (Addendum)
Unable to start a second IV. We will try again when pt comes back from MRI. Potassium infusing. Could not start keppra due to IV incompatibility.

## 2021-03-16 NOTE — Consult Note (Addendum)
Neurology Consultation  Reason for Consult: Seizure Referring Physician: Dr. Loni Muse. Gray/W. Lavone Orn, PA CC: Seizure  History is obtained from: Patient's husband, chart  HPI: Judy Meyer is a 60 y.o. 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 According to the husband, she was in bed this morning and he noticed that she was convulsing with whole body shaking.  It seemed like her left arm had bent and become stiff and her left leg also appeared to be stiffer.  This episode lasted for about 15 to 20 minutes and she was unresponsive afterwards.   EMS was called, had another seizure episode for which she received 5 of Versed. She was brought to the emergency room for further evaluation where she remained very less responsive as well as noticed to have some left-sided weakness-not withdrawing the left side as much as the right side.  Noncontrasted head CT with no acute findings. Initial labs work-up underway-I recommended adding an ammonia level. Patient unable to provide any history at this time    LKW: Sometime last night tpa given?: no, most likely toxic metabolic encephalopathy versus seizure Premorbid modified Rankin scale (mRS): 2   ROS:  Unable to obtain due to altered mental status.   Past Medical History:  Diagnosis Date   Hypertension    Hypothyroidism    Liver disease    Uterine fibroid         Family History  Problem Relation Age of Onset   Hypertension Other    Hypertension Mother    High Cholesterol Mother      Social History:   reports that she has never smoked. She has never used smokeless  tobacco. She reports previous alcohol use. She reports that she does not use drugs.  Medications  Current Facility-Administered Medications:    potassium chloride 10 mEq in 100 mL IVPB, 10 mEq, Intravenous, Q1 Hr x 3, [place], [place], [place], Last Rate: 100 mL/hr at 03/16/21 1132, 10 mEq at 03/16/21 1132  Current Outpatient Medications:    albuterol (VENTOLIN HFA) 108 (90 Base) MCG/ACT inhaler, Inhale 2 puffs into the lungs 4 (four) times daily as needed for wheezing or shortness of breath., Disp: , Rfl:    amLODipine (NORVASC) 10 MG tablet, Take 1 tablet (10 mg total) by mouth daily., Disp: 90 tablet, Rfl: 0   cholestyramine light (PREVALITE) 4 g packet, Take 1 packet (4 g total) by mouth every 12 (twelve) hours., Disp: 60 packet, Rfl: 0   folic acid (FOLVITE) 1 MG tablet, Take 1 tablet (1 mg total) by mouth daily., Disp: 90 tablet, Rfl: 1   Galcanezumab-gnlm 120 MG/ML SOAJ, Inject 120 mLs into the skin every 30 (thirty) days., Disp: , Rfl:    levothyroxine (SYNTHROID) 50 MCG tablet, Take 1 tablet (50 mcg total) by mouth daily at 6 (six) AM., Disp: 90 tablet, Rfl: 0   methocarbamol (ROBAXIN) 500 MG tablet, Take 1 tablet (500 mg total) by mouth every 8 (eight) hours as needed for muscle spasms., Disp: 15 tablet, Rfl: 0   Multiple Vitamin (MULTIVITAMIN WITH MINERALS) TABS tablet, Take 1 tablet by mouth daily., Disp: 90 tablet, Rfl: 1   ondansetron (ZOFRAN) 4 MG tablet, Take 1 tablet (4 mg total) by mouth  daily as needed for nausea or vomiting., Disp: 30 tablet, Rfl: 1   pantoprazole (PROTONIX) 40 MG tablet, Take 1 tablet (40 mg total) by mouth daily., Disp: 30 tablet, Rfl: 0   sodium bicarbonate 650 MG tablet, Take 1 tablet (650 mg total) by mouth 3 (three) times daily., Disp: 30 tablet, Rfl: 0   thiamine 100 MG tablet, Take 1 tablet (100 mg total) by mouth daily., Disp: 90 tablet, Rfl: 0   ursodiol (ACTIGALL) 250 MG tablet, Take 1 tablet (250 mg total) by mouth 3 (three) times daily., Disp: 120  tablet, Rfl: 1   venlafaxine XR (EFFEXOR-XR) 75 MG 24 hr capsule, Take 1 capsule (75 mg total) by mouth daily., Disp: 90 capsule, Rfl: 0   Exam: Current vital signs: BP 137/64   Pulse (!) 103   Temp 97.6 F (36.4 C) (Axillary)   Resp 19   LMP 06/11/2018   SpO2 97%  Vital signs in last 24 hours: Temp:  [97.6 F (36.4 C)] 97.6 F (36.4 C) (08/08 0935) Pulse Rate:  [103-107] 103 (08/08 1000) Resp:  [15-19] 19 (08/08 1000) BP: (126-137)/(51-64) 137/64 (08/08 1000) SpO2:  [97 %-99 %] 97 % (08/08 1000) General: Extremely drowsy, opens eyes to voice.   HEENT: Normocephalic, atraumatic, mild scleral icterus CVS: Regular rate rhythm Respiratory breathing well saturating normally on room air Extremities warm well perfused Abdomen nontender Neurological exam Very drowsy, opens eyes to voice-was able to tell me her name.  Was not able to tell me where she has for her correct age.  Very poor attention concentration-kept falling asleep for most of the encounter.  Did not follow simple commands consistently.   Cranial nerves: Pupils appear equal round reactive light, no gaze preference or deviation, inconsistent blink from both sides, face appears symmetric. Motor examination: No spontaneous movement, upon instructing her to lift her arms up she did not follow through but when passively keeping her arms up, she was able to hold the right arm up with some strength indicating at least 4/5 power versus left upper extremity which was essentially flaccid. No spontaneous movement of the lower extremities.  Minimal withdrawal to noxious stimulation in both lower extremities Sensation: As above Coordination cannot be tested due to her mentation  Labs I have reviewed labs in epic and the results pertinent to this consultation are: Leukocytosis with a white count of 19.3 thousand hemoglobin 10.7, platelets 564,000, potassium 2.3, glucose 173 creatinine 1.36 Ammonia 110.  CBC    Component Value  Date/Time   WBC 19.3 (H) 03/16/2021 1022   RBC 3.94 03/16/2021 1022   HGB 10.7 (L) 03/16/2021 1022   HCT 36.1 03/16/2021 1022   PLT 564 (H) 03/16/2021 1022   MCV 91.6 03/16/2021 1022   MCH 27.2 03/16/2021 1022   MCHC 29.6 (L) 03/16/2021 1022   RDW 15.7 (H) 03/16/2021 1022   LYMPHSABS 12.1 (H) 03/16/2021 1022   MONOABS 0.9 03/16/2021 1022   EOSABS 0.0 03/16/2021 1022   BASOSABS 0.0 03/16/2021 1022    CMP     Component Value Date/Time   NA 138 03/16/2021 1022   K 2.3 (LL) 03/16/2021 1022   CL 105 03/16/2021 1022   CO2 11 (L) 03/16/2021 1022   GLUCOSE 173 (H) 03/16/2021 1022   BUN 8 03/16/2021 1022   CREATININE 1.36 (H) 03/16/2021 1022   CALCIUM 9.3 03/16/2021 1022   PROT 5.1 (L) 11/20/2020 0407   ALBUMIN 2.3 (L) 11/20/2020 0407   AST 93 (H) 11/20/2020 0407  ALT 19 11/20/2020 0407   ALKPHOS 94 11/20/2020 0407   BILITOT 12.9 (H) 11/20/2020 0407   GFRNONAA 45 (L) 03/16/2021 1022   GFRAA >60 04/07/2020 0601   Imaging I have reviewed the images obtained: CT-head without contrast: No acute changes.  Assessment: Ms. Oliverio is a 60 year old woman with above past medical history including that of remote history of seizures-not had a seizure in many years and not on any antiepileptics brought in for concern for seizure-like activity this morning where her whole body was shaking and she had more stiffness of the left arm and leg in comparison to the right and after that remained unresponsive and had another seizure witnessed by EMS for which she received Versed.  Her mentation is still far from her baseline. Her labs reveal leukocytosis, anemia, severe hypokalemia. She also has a bump in her creatinine from normal at baseline to 1.36. Also significant hyperammonemia. The witnessed seizures might have been provoked due to the above metabolic derangements in someone with a true predisposition and a history of seizures. Antiepileptic treatment would be indicated in this case.  Further  work-up from a seizure perspective is also detailed below  Impression: Provoked seizure in the setting of prior history of seizure, toxic metabolic derangements like acute hepatic encephalopathy. 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 Continue Keppra 500 twice daily Lactulose for correction of hyperammonemia EEG MRI of the brain without contrast when able to Maintain seizure precautions Correction of toxic metabolic derangements including renal and hepatic dysfunction per primary team. We will continue to follow with you. Plan discussed with Sherron Monday, PA in the ER.  -- Amie Portland, MD Neurologist [facility] Pager: [phone]

## 2021-03-16 NOTE — ED Triage Notes (Addendum)
BIB EMS for seizure. Pt was found having a seizure, when EMS arrived pt had another seizure, about 2 mins long. Versed was given. Pt arrived in ED unresponsive. VSS, 2L on nasal canula. Hx of seizure 10 years ago.

## 2021-03-16 NOTE — H&P (Addendum)
History and Physical    Judy Meyer [id number] DOB: 05/06/1961 DOA: 03/16/2021  PCP: Dr. Posey Pronto with [facility]  Consultants:  hepatology: wake Dr. Katharine Look  Patient coming from:  Home - lives with husband.   Chief Complaint: seizure like activity and AMS   HPI: Judy Meyer is a 60 y.o. female with medical history significant of HTN, HLD, past alcohol use, acute alcohol associated hepatitis followed by [facility] hepatology, hypothyroidism and migraines who presented with seizure like activity. Her husband woke up with her having a seizure around 8:00am this AM. Her whole body was shaking, but more pronounced on the left side. No urinary incontinence. She did bite her tongue and drooling. She didn't respond to her husband. He is unsure how long it lasted for, but guessing around 7-10 minutes. Prior to seizure, she has had a migraine for a week with nausea, back pain and neck pain. No fever/chills. She has had sneezing and mild cough. No shortness of breath. No chest pain or palpitations. She has had some diarrhea, but has history of IBS. No other sick contacts. No recent travel.   Hx of seizures about 15 years ago. Was on AED, topamax she states. Not sure when she stopped this.    ED Course: vitals: afebrile, bp: 126/51, HR: 106, RR: 17, oxygen: 99% via 2L [place]  Given '5mg'$  versed with Ems for seizure. Pertinent labs:  Ammonia: 110, potassium 2.2, creatinine: 1.36, CO2: 11, wbc: 19.3, hgb: 10.7, TSH: 25, CTH:  no acute findings MRI head: T2/flair signal abnormality w/in the medial right temporal lobe/hippocampus and crus of the fornix. Acute changes secondary to seizures vs. Acute encephalitis. CSF obtained in ER. Neurology consulted. Started on acyclovir, potassium repleted, given loading dose of keppra and 1L of fluid. Asked to admit for seizure like activity.     Review of Systems: As per HPI; otherwise review of systems reviewed and negative.   Ambulatory Status:  Ambulates with  walker    Past Medical History:  Diagnosis Date   Hypertension    Hypothyroidism    Liver disease    Uterine fibroid     Past Surgical History:  Procedure Laterality Date   hand fracture surgery Right    LIVER BIOPSY      Social History   Socioeconomic History   Marital status: Single    Spouse name: Not on file   Number of children: Not on file   Years of education: Not on file   Highest education level: Not on file  Occupational History   Not on file  Tobacco Use   Smoking status: Never   Smokeless tobacco: Never  Vaping Use   Vaping Use: Some days   Start date: 09/09/2020  Substance and Sexual Activity   Alcohol use: Not Currently    Comment: last drink was approximately 5 months ago   Drug use: Never   Sexual activity: Yes  Other Topics Concern   Not on file  Social History Narrative   Not on file   Social Determinants of Health   Financial Resource Strain: Not on file  Food Insecurity: Not on file  Transportation Needs: Not on file  Physical Activity: Not on file  Stress: Not on file  Social Connections: Not on file  Intimate Partner Violence: Not on file    Allergies  Allergen Reactions   Compazine [Prochlorperazine Edisylate] Anaphylaxis   Periactin [Cyproheptadine] Anaphylaxis   Cyclobenzaprine Other (See Comments)    Palpations  Haloperidol And Related Other (See Comments)    Caused seizure   Metoclopramide Other (See Comments)    Caused seizure    Family History  Problem Relation Age of Onset   Hypertension Other    Hypertension Mother    High Cholesterol Mother     Prior to Admission medications   Medication Sig Start Date End Date Taking? Authorizing Provider  albuterol (VENTOLIN HFA) 108 (90 Base) MCG/ACT inhaler Inhale 2 puffs into the lungs 4 (four) times daily as needed for wheezing or shortness of breath. 12/20/19   [provider]  amLODipine (NORVASC) 10 MG tablet Take 1 tablet (10 mg total) by mouth daily.  11/21/20   Mercy Riding, MD  cholestyramine light (PREVALITE) 4 g packet Take 1 packet (4 g total) by mouth every 12 (twelve) hours. 11/20/20 12/20/20  Mercy Riding, MD  folic acid (FOLVITE) 1 MG tablet Take 1 tablet (1 mg total) by mouth daily. 11/20/20   Mercy Riding, MD  Galcanezumab-gnlm 120 MG/ML SOAJ Inject 120 mLs into the skin every 30 (thirty) days.    [provider]  levothyroxine (SYNTHROID) 50 MCG tablet Take 1 tablet (50 mcg total) by mouth daily at 6 (six) AM. 11/21/20   Gonfa, Charlesetta Ivory, MD  methocarbamol (ROBAXIN) 500 MG tablet Take 1 tablet (500 mg total) by mouth every 8 (eight) hours as needed for muscle spasms. 11/20/20   Mercy Riding, MD  Multiple Vitamin (MULTIVITAMIN WITH MINERALS) TABS tablet Take 1 tablet by mouth daily. 11/21/20   Mercy Riding, MD  ondansetron (ZOFRAN) 4 MG tablet Take 1 tablet (4 mg total) by mouth daily as needed for nausea or vomiting. 11/20/20 11/20/21  Mercy Riding, MD  pantoprazole (PROTONIX) 40 MG tablet Take 1 tablet (40 mg total) by mouth daily. 11/20/20   Mercy Riding, MD  sodium bicarbonate 650 MG tablet Take 1 tablet (650 mg total) by mouth 3 (three) times daily. 11/20/20   Mercy Riding, MD  thiamine 100 MG tablet Take 1 tablet (100 mg total) by mouth daily. 11/20/20   Mercy Riding, MD  ursodiol (ACTIGALL) 250 MG tablet Take 1 tablet (250 mg total) by mouth 3 (three) times daily. 11/20/20   Mercy Riding, MD  venlafaxine XR (EFFEXOR-XR) 75 MG 24 hr capsule Take 1 capsule (75 mg total) by mouth daily. 11/20/20   Mercy Riding, MD    Physical Exam: Vitals:   03/16/21 1130 03/16/21 1134 03/16/21 1522 03/16/21 1545  BP: (!) 153/72  (!) 143/67 137/67  Pulse: 96  100 97  Resp: '14  16 19  '$ Temp:      TempSrc:      SpO2: 98%  100% 100%  Weight:   77.4 kg   Height:  '5\' 6"'$  (1.676 m)       General:  Appears calm and comfortable and is in NAD Eyes:  PERRL, EOMI, normal lids, iris ENT:  grossly normal hearing, lips & tongue, dry mucous  membranes; appropriate dentition Neck:  no LAD, masses or thyromegaly; no carotid bruits Cardiovascular:  RRR, no m/r/g. No LE edema.  Respiratory:   CTA bilaterally with no wheezes/rales/rhonchi.  Normal respiratory effort. Abdomen:  soft, mild generalized tenderness, no rebound or guarding.  ND, NABS Back:   normal alignment, no CVAT Skin:  no rash or induration seen on limited exam Musculoskeletal:  grossly normal tone BUE/BLE, good ROM, no bony abnormality Lower extremity:  No LE edema.  Limited foot exam with no ulcerations.  2+ distal pulses. Psychiatric:  grossly normal mood and affect, speech fluent and appropriate, AOx3 Neurologic:  CN 2-12 grossly intact, moves all extremities in coordinated fashion, sensation intact   Radiological Exams on Admission: Independently reviewed - see discussion in A/P where applicable  CT ABDOMEN PELVIS WO CONTRAST  Result Date: 03/16/2021 CLINICAL DATA:  Abdominal pain EXAM: CT ABDOMEN AND PELVIS WITHOUT CONTRAST TECHNIQUE: Multidetector CT imaging of the abdomen and pelvis was performed following the standard protocol without IV contrast. COMPARISON:  CT abdomen and pelvis dated April 8th 2022 FINDINGS: Lower chest: No acute abnormality. Hepatobiliary: No suspicious focal liver lesions. Gallbladder is distended, unchanged compared to prior CT. Limited evaluation of the bile ducts due to motion artifact. Pancreas: Limited evaluation of the pancreatic head due to motion artifact. No evidence of inflammatory change surrounding the pancreatic tail. Spleen: Normal in size without focal abnormality. Adrenals/Urinary Tract: No hydronephrosis. Punctate nonobstructing stone in the lower pole of the right kidney. 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 Normal bladder. Stomach/Bowel: No evidence of obstruction. Limited evaluation for bowel wall thickening, especially in the upper abdomen due to motion artifact.  Normal appendix. Vascular/Lymphatic: Aortic atherosclerosis. No enlarged abdominal or pelvic lymph nodes. Reproductive: Fibroid uterus. Other: No abdominal wall hernia or abnormality. No abdominopelvic ascites. Musculoskeletal: No acute or significant osseous findings. IMPRESSION: Limited exam due to motion artifact, per tech report best images obtainable. No CT findings to explain abdominal pain. Hydropic gallbladder, unchanged compared to prior CT. Fibroid uterus. Electronically Signed   By: Yetta Glassman MD   On: 03/16/2021 14:52   CT HEAD WO CONTRAST  Result Date: 03/16/2021 CLINICAL DATA:  Mental status change, unknown cause seizure, altered mental status EXAM: CT HEAD WITHOUT CONTRAST TECHNIQUE: Contiguous axial images were obtained from the base of the skull through the vertex without intravenous contrast. COMPARISON:  CT head 12/09/2006 FINDINGS: Brain: There is no evidence of acute intracranial hemorrhage, extra-axial fluid collection, or infarct. The ventricles are not enlarged. There is no midline shift. There is no mass lesion. Incidental note is made of a cavum septum pellucidum. Vascular: No hyperdense vessel or unexpected calcification. Skull: Normal. Negative for fracture or focal lesion. Sinuses/Orbits: The imaged paranasal sinuses are clear. The imaged orbits and globes are unremarkable. Other: The mastoid air cells are clear. IMPRESSION: No acute intracranial pathology or epileptogenic focus identified. Electronically Signed   By: Valetta Mole MD   On: 03/16/2021 10:39   MR BRAIN WO CONTRAST  Result Date: 03/16/2021 CLINICAL DATA:  Seizure, abnormal neuro exam. EXAM: MRI HEAD WITHOUT CONTRAST TECHNIQUE: Multiplanar, multiecho pulse sequences of the brain and surrounding structures were obtained without intravenous contrast. COMPARISON:  Prior head CT examinations 03/16/2021 and earlier. FINDINGS: Brain: Mild intermittent motion degradation. Mild generalized cerebral and cerebellar  atrophy. There is diffusion weighted signal abnormality within the medial right temporal lobe/hippocampus, extending posteriorly to the crus of the fornix (for instance as seen on series 2, images 21-26). Subtle asymmetric T2/FLAIR hyperintensity is also questioned at this site. No appreciable asymmetric hippocampal volume loss. Moderate and age-advanced multifocal T2/FLAIR hyperintensity within the cerebral white matter, nonspecific but most often secondary to chronic small vessel ischemia. No chronic intracranial blood products. No extra-axial fluid collection. No midline shift. Incidentally noted cavum septum pellucidum and cavum vergae. Vascular: Maintained proximal large arterial flow voids. Skull and upper cervical spine: No focal marrow lesion Sinuses/Orbits: Visualized orbits show  no acute finding. No significant paranasal sinus disease at the imaged levels. Impression #2 will be called to the ordering clinician or representative by the Radiologist Assistant, and communication documented in the PACS or [employer]. IMPRESSION: Mildly motion degraded exam. Diffusion-weighted and possible T2/FLAIR signal abnormality within the medial right temporal lobe/hippocampus and crus of fornix. Primary considerations are acute signal changes related to recent seizure and/or signal changes related to acute encephalitis (including HSV encephalitis). Correlate clinically, and with CSF analysis as warranted. Additionally, short-interval MRI follow-up is recommended to ensure resolution of this signal abnormality, and to exclude an underlying mass/lesion at this site. Moderate and age-advanced multifocal T2/FLAIR hyperintense signal changes within the cerebral white matter, nonspecific but most often secondary to chronic small vessel ischemia. Mild generalized parenchymal atrophy. Electronically Signed   By: Kellie Simmering DO   On: 03/16/2021 14:22   EEG adult  Result Date: 03/16/2021 Lora Havens, MD     03/16/2021   3:31 PM Patient Name: CILIA PIEDRA MRN: [id number] Epilepsy Attending: Lora Havens Referring Physician/Provider: Dr Amie [place] Date: 03/16/2021 Duration: 27.21 mins Patient history: 60 year old woman with history of seizures not on antiepileptics who presented with seizure-like activity.  EEG to evaluate for seizures. Level of alertness: Awake AEDs during EEG study: Keppra Technical aspects: This EEG study was done with scalp electrodes positioned according to the 10-20 International system of electrode placement. Electrical activity was acquired at a sampling rate of '500Hz'$  and reviewed with a high frequency filter of '70Hz'$  and a low frequency filter of '1Hz'$ . EEG data were recorded continuously and digitally stored. Description: The posterior dominant rhythm consists of 8 Hz activity of moderate voltage (25-35 uV) seen predominantly in posterior head regions, symmetric and reactive to eye opening and eye closing. Lateralized periodic discharges were noted arising from right hemisphere, maximal right frontotemporal region at 1455 with some evolution in frequency.  However routine EEG ended and no definite evolution was noted in morphology or space. Hyperventilation and photic stimulation were not performed.   ABNORMALITY - Lateralized periodic discharges ( LPD ) right hemisphere, maximal right frontotemporal region IMPRESSION: This study showed evidence of epileptogenicity arising from right hemisphere, maximal right frontotemporal region likely due to underlying structural abnormality with high potential for seizure recurrence.  One episode was noted at 1455 during which lateralized periodic discharges appear to evolve in frequency.  However, routine EEG ended and no definite evolution was noted in morphology or space. Dr Rory Percy was notified. Recommend long-term monitoring for further evaluation Priyanka [name]    EKG: Independently reviewed.  Sinus tachycardia with rate 105; nonspecific ST changes with no  evidence of acute ischemia   Labs on Admission: I have personally reviewed the available labs and imaging studies at the time of the admission.  Pertinent labs:  Ammonia: 110,  potassium 2.2-->5.5 creatinine: 1.36-->.94 CO2: 11--->16  wbc: 19.3,  hgb: 10.7 (baseline of 9.4-11.6) TSH: 25,   Assessment/Plan Principal Problem:   Seizure ([place]) Witnessed seizure at home and with Ems with remote history of seizures x 15 years ago Neurology consulted and following EEG pending and started on keppra. AED per neurology  MRI with changes secondary to seizure vs. Acute encephalitis.  -LP done, studies pending and started on acyclovir -continue seizure precautions. Has been seizure free since arriving in ED.  -continue to follow neurology recommendations.   Active Problems:   Acute metabolic encephalopathy -much improved, about resolved when I saw her. Alert, oriented and responding appropriately.  -  multifactorial. She has hyperammonemia, AKI, abnormal TSH, seizure and possible encephalitis -lacutulose per neurology written for correction of elevated ammonia -adjusting home thyroid dose  -IVF for AKI likely pre renal etiology  -seizure treatment per neurology and studies/treatment for encephalitis    Hypokalemia -magnesium wnl -repleted in ED with repeat bmp at 5.5  -continue to follow and place on telemetry  -has chronic hx, has not been drinking any alcohol  -may benefit from outpatient nephrology consult     Acidosis, metabolic -repeat bmp. Lactic acid not checked and ? If due to from lactic acid, pending  -has been taking more ASA this week in her migraine medication -IVF and recheck   Hyperammonemia -? If from seizure with muscle exertion. Has not been drinking and liver enzymes wnl.  Lactulose enema BID written by neurology. Repeat level in AM and adjust medication accordingly  Leukocytosis -do not think she has a bacterial infection at this time. CXR clear, urine appears  clean. No fever/chills. ? Encephalitis -IVF and follow  -blood cultures if fever    AKI (acute kidney injury) ([place]) Likely pre renal from seizure and poor PO intake and episodes of diarrhea  Checking CK as well IVF overnight and  monitor. Avoid nephrotoxic drugs.     Hypothyroidism Increasing her home synthroid from 35mg to 761m. Has been on 5067mfor months and TSH still not to goal Repeat thyroid labs in 6-8 weeks.     HTN (hypertension) Well controlled. Continue home norvasc '10mg'$ /day     Macrocytic anemia Around her baseline, continue to monitor.     Hyperlipidemia On no medication likely secondary to liver disease Lipid panel 12/21: LDL of 88, total chol: 139, HDL of 5    Hx of acute alcohol associated hepatitis/steatosis Liver enzymes wnl.  Okay with tylenol in smaller doses. No more than '2000mg'$ /24 hours  Continue to monitor Has not drank in months.  [facility] -has H63D genetic mutation for hereditary hemochromatosis  Body mass index is 27.54 kg/m.   Level of care: Telemetry Medical DVT prophylaxis:  Lovenox  Code Status:  Full - confirmed with patient Family Communication: significant other present: [name] Dicken Disposition Plan:  The patient is from: home  Anticipated d/c is to: home  Anticipated d/c date will depend on clinical response to treatment, but possibly as early as tomorrow if she has excellent response to treatment  Patient is currently: acutely ill Consults called: neurology and GI by EDP  Admission status:  inpatient     AllOrma Flaming [facility]   How to contact the [facility] Cent[facility]ending or Consulting provider 7A [place] covering provider during after hours 7P -7A, for this patient?  Check the care team in [facility] look for a) attending/consulting [facility] provider listed and b) the [facility] Systemsam listed Log into [email or web address] and use [place]'s universal password to access. If you do not have the password,  please contact the hospital operator. Locate the TR[facility] Surgical Instituteovider you are looking for under [facility] and page to a number that you can be directly reached. If you still have difficulty reaching the provider, please page the DOC[facility] Systemirector on Call) for the Hospitalists listed on amion for assistance.   03/16/2021, 5:17 PM

## 2021-03-16 NOTE — ED Notes (Signed)
Pt vomited after oral pain medication administration.

## 2021-03-17 DIAGNOSIS — G4089 Other seizures: Secondary | ICD-10-CM

## 2021-03-17 LAB — CBC
HCT: 34.5 % — ABNORMAL LOW (ref 36.0–46.0)
Hemoglobin: 10.1 g/dL — ABNORMAL LOW (ref 12.0–15.0)
MCH: 26.9 pg (ref 26.0–34.0)
MCHC: 29.3 g/dL — ABNORMAL LOW (ref 30.0–36.0)
MCV: 92 fL (ref 80.0–100.0)
Platelets: 433 10*3/uL — ABNORMAL HIGH (ref 150–400)
RBC: 3.75 MIL/uL — ABNORMAL LOW (ref 3.87–5.11)
RDW: 15.9 % — ABNORMAL HIGH (ref 11.5–15.5)
WBC: 10.1 10*3/uL (ref 4.0–10.5)
nRBC: 0 % (ref 0.0–0.2)

## 2021-03-17 LAB — BASIC METABOLIC PANEL
Anion gap: 18 — ABNORMAL HIGH (ref 5–15)
BUN: 7 mg/dL (ref 6–20)
CO2: 16 mmol/L — ABNORMAL LOW (ref 22–32)
Calcium: 9.7 mg/dL (ref 8.9–10.3)
Chloride: 105 mmol/L (ref 98–111)
Creatinine, Ser: 1.02 mg/dL — ABNORMAL HIGH (ref 0.44–1.00)
GFR, Estimated: >60 mL/min (ref >60)
Glucose, Bld: 125 mg/dL — ABNORMAL HIGH (ref 70–99)
Potassium: 2.6 mmol/L — CL (ref 3.5–5.1)
Sodium: 139 mmol/L (ref 135–145)

## 2021-03-17 LAB — COMPREHENSIVE METABOLIC PANEL
ALT: 16 U/L (ref 0–44)
AST: 31 U/L (ref 15–41)
Albumin: 3.7 g/dL (ref 3.5–5.0)
Alkaline Phosphatase: 76 U/L (ref 38–126)
Anion gap: 16 — ABNORMAL HIGH (ref 5–15)
BUN: 6 mg/dL (ref 6–20)
CO2: 16 mmol/L — ABNORMAL LOW (ref 22–32)
Calcium: 9 mg/dL (ref 8.9–10.3)
Chloride: 104 mmol/L (ref 98–111)
Creatinine, Ser: 1 mg/dL (ref 0.44–1.00)
GFR, Estimated: >60 mL/min (ref >60)
Glucose, Bld: 95 mg/dL (ref 70–99)
Potassium: 2.1 mmol/L — CL (ref 3.5–5.1)
Sodium: 136 mmol/L (ref 135–145)
Total Bilirubin: 0.8 mg/dL (ref 0.3–1.2)
Total Protein: 7.7 g/dL (ref 6.5–8.1)

## 2021-03-17 LAB — LACTIC ACID, PLASMA: Lactic Acid, Venous: 1.1 mmol/L (ref 0.5–1.9)

## 2021-03-17 LAB — MAGNESIUM
Magnesium: 2.2 mg/dL (ref 1.7–2.4)
Magnesium: 2.1 mg/dL (ref 1.7–2.4)

## 2021-03-17 LAB — AMMONIA: Ammonia: 91 umol/L — ABNORMAL HIGH (ref 9–35)

## 2021-03-17 LAB — TOPIRAMATE LEVEL: Topiramate Lvl: <1.5 ug/mL — ABNORMAL LOW (ref 2.0–25.0)

## 2021-03-17 MED ORDER — LEVETIRACETAM IN NACL 500 MG/100ML IV SOLN
500.0000 mg | Freq: Two times a day (BID) | INTRAVENOUS | Status: DC
  Administered 2021-03-17 – 2021-03-19 (×5): 500 mg via INTRAVENOUS
  Filled 2021-03-17 (×5): qty 100

## 2021-03-17 MED ORDER — PANTOPRAZOLE SODIUM 40 MG IV SOLR: 40.0000 mg | Freq: Two times a day (BID) | INTRAVENOUS | Status: DC

## 2021-03-17 MED ORDER — AMLODIPINE BESYLATE 10 MG PO TABS
10.0000 mg | ORAL_TABLET | Freq: Every day | ORAL | Status: DC
  Administered 2021-03-17 – 2021-03-20 (×4): 10 mg via ORAL
  Filled 2021-03-17: qty 1
  Filled 2021-03-17: qty 2
  Filled 2021-03-17 (×2): qty 1

## 2021-03-17 MED ORDER — LACTATED RINGERS IV SOLN: INTRAVENOUS | Status: AC

## 2021-03-17 MED ORDER — POTASSIUM CHLORIDE 20 MEQ PO PACK
40.0000 meq | PACK | Freq: Once | ORAL | Status: AC
  Administered 2021-03-17: 40 meq via ORAL
  Filled 2021-03-17: qty 2

## 2021-03-17 MED ORDER — LACTULOSE 10 GM/15ML PO SOLN
30.0000 g | Freq: Two times a day (BID) | ORAL | Status: DC
  Administered 2021-03-17 – 2021-03-18 (×4): 30 g via ORAL
  Filled 2021-03-17 (×4): qty 60

## 2021-03-17 MED ORDER — POTASSIUM CHLORIDE 10 MEQ/100ML IV SOLN
10.0000 meq | INTRAVENOUS | Status: AC
  Administered 2021-03-17 (×3): 10 meq via INTRAVENOUS
  Filled 2021-03-17 (×3): qty 100

## 2021-03-17 MED ORDER — LEVOTHYROXINE SODIUM 75 MCG PO TABS
75.0000 ug | ORAL_TABLET | Freq: Every day | ORAL | Status: DC
  Administered 2021-03-17 – 2021-03-20 (×4): 75 ug via ORAL
  Filled 2021-03-17 (×4): qty 1

## 2021-03-17 MED ORDER — PANTOPRAZOLE SODIUM 40 MG PO TBEC
40.0000 mg | DELAYED_RELEASE_TABLET | Freq: Every day | ORAL | Status: DC
  Administered 2021-03-17 – 2021-03-20 (×4): 40 mg via ORAL
  Filled 2021-03-17 (×4): qty 1

## 2021-03-17 MED ORDER — ONDANSETRON HCL 4 MG/2ML IJ SOLN
4.0000 mg | INTRAMUSCULAR | Status: DC | PRN
Start: 2021-03-17 — End: 2021-03-17

## 2021-03-17 MED ORDER — VENLAFAXINE HCL ER 75 MG PO CP24
75.0000 mg | ORAL_CAPSULE | Freq: Every day | ORAL | Status: DC
  Administered 2021-03-18 – 2021-03-20 (×3): 75 mg via ORAL
  Filled 2021-03-17 (×6): qty 1

## 2021-03-17 MED ORDER — NORETHIN-ETH ESTRAD TRIPHASIC 0.5/0.75/1-35 MG-MCG PO TABS
1.0000 | ORAL_TABLET | Freq: Every day | ORAL | Status: DC
  Filled 2021-03-17: qty 1

## 2021-03-17 MED ORDER — SENNOSIDES-DOCUSATE SODIUM 8.6-50 MG PO TABS
1.0000 | ORAL_TABLET | Freq: Two times a day (BID) | ORAL | Status: DC
  Administered 2021-03-17 – 2021-03-20 (×4): 1 via ORAL
  Filled 2021-03-17 (×4): qty 1

## 2021-03-17 NOTE — ED Notes (Signed)
Peri care linens changed pure wick replaced pt repositioned

## 2021-03-17 NOTE — Progress Notes (Signed)
PROGRESS NOTE    Judy Meyer  [id number] DOB: 02/21/1961 DOA: 03/16/2021 PCP: Pcp, No    Chief Complaint  Patient presents with   Seizures    Brief Narrative:   Judy Meyer is a 60 y.o. 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, hypothyroidism and migraines who presented with seizure , hepatic encephalopathy  Subjective:  She is slow in answering questions but now orientedx3, reports epigastric pain, feeling nauseous, dry heaving, no active vomiting Reports feeling heart palpitation No seizure today Reports quit drinking a month ago  Assessment & Plan:   Principal Problem:   Seizure ([place]) Active Problems:   Hypokalemia   Hypothyroidism   Hyperlipidemia   HTN (hypertension)   Acidosis, metabolic   Macrocytic anemia   Acute metabolic encephalopathy   Leukocytosis   AKI (acute kidney injury) ([place])   Hyperammonemia ([place])  Seizure -with h/o seizure in the past,  -seen by neurology, started on keppra ? Hsv encephalitis? Csf cell count does not suggest hsv , neurology recommend continue acyclovir until HSV PCR come back negative -Appreciate neurology input will follow recommendations  Hepatic encephalopathy -Ammonia level 110 on presentation -Seen by GI, started on lactulose -Improving  History of alcohol associated hepatitis/steatosis LFTs within normal limit, INR within normal limit Limited Tylenol last done 2 g per 24 hours Reports stopped drinking for a month Follow up with hepatology at [facility]  Epigastric pain Resume Protonix Reports stool is dark, check FOBT Hemoglobin close to baseline, monitor  AKI -Likely prerenal, resolved  Hypokalemia,  replaced, repeat potassium pending, check mag  Metabolic acidosis Lactic acid within normal limit Start on LR Repeat BMP in the morning  Hypothyroidism Continue Synthroid    Body mass  index is 27.54 kg/m.Marland Kitchen      Unresulted Labs (From admission, onward)     Start     Ordered   03/18/21 0500  CBC  Tomorrow morning,   R        03/17/21 1634   03/18/21 [id number]  Basic metabolic panel  Tomorrow morning,   R        03/17/21 1634   03/17/21 [id number]  Basic metabolic panel  Once-Timed,   STAT        03/17/21 0646   03/17/21 1400  Magnesium  Once-Timed,   STAT        03/17/21 0646   03/16/21 1452  HSV 1/2 PCR, CSF Cerebrospinal Fluid  ONCE - STAT,   STAT        03/16/21 1452   Unscheduled  Occult blood card to lab, stool  As needed,   R      03/17/21 1633              DVT prophylaxis: enoxaparin (LOVENOX) injection 40 mg Start: 03/16/21 1830   Code Status: Full Family Communication: Patient Disposition:   Status is: Inpatient   Dispo: The patient is from: Home              Anticipated d/c is to: Home              Anticipated d/c date is: 24 to 48 hours, need neurology clearance                Consultants:  neurology GI  Procedures:  EEG  Antimicrobials:   Anti-infectives (From admission, onward)    Start     Dose/Rate Route Frequency  Ordered Stop   03/17/21 0300  acyclovir (ZOVIRAX) 775 mg in dextrose 5 % 150 mL IVPB        10 mg/kg  77.4 kg 165.5 mL/hr over 60 Minutes Intravenous Every 8 hours 03/16/21 2039     03/16/21 1700  acyclovir (ZOVIRAX) 775 mg in dextrose 5 % 150 mL IVPB  Status:  Discontinued        10 mg/kg  77.4 kg 165.5 mL/hr over 60 Minutes Intravenous Every 12 hours 03/16/21 1535 03/16/21 2039           Objective: Vitals:   03/17/21 1445 03/17/21 1500 03/17/21 1600 03/17/21 1615  BP: (!) 155/76 (!) 158/76 (!) 157/80   Pulse: 97 100  96  Resp:  18  (!) 22  Temp:      TempSrc:      SpO2: 98% 100%  97%  Weight:      Height:        Intake/Output Summary (Last 24 hours) at 03/17/2021 1634 Last data filed at 03/16/2021 2036 Gross per 24 hour  Intake 643.77 ml  Output --  Net 643.77 ml   Filed Weights   03/16/21 1522   Weight: 77.4 kg    Examination:  General exam: slow in answering questions, calm, NAD, dry oral mucosa Respiratory system: Clear to auscultation. Respiratory effort normal. Cardiovascular system: S1 & S2 heard, RRR. No JVD, no murmur, No pedal edema. Gastrointestinal system: epigastric tenderness, no gaurding, no rebound, Abdomen is nondistended, soft and nontender. Normal bowel sounds heard. Central nervous system: Alert and orientedx3 No focal neurological deficits. Extremities: Symmetric 5 x 5 power. Skin: No rashes, lesions or ulcers Psychiatry: Judgement and insight appear normal. Mood & affect appropriate.     Data Reviewed: I have personally reviewed following labs and imaging studies  CBC: Recent Labs  Lab 03/16/21 1007 03/16/21 1022 03/17/21 0428  WBC  --  19.3* 10.1  NEUTROABS  --  6.1  --   HGB 10.9* 10.7* 10.1*  HCT 32.0* 36.1 34.5*  MCV  --  91.6 92.0  PLT  --  564* 433*    Basic Metabolic Panel: Recent Labs  Lab 03/16/21 1007 03/16/21 1022 03/16/21 1601 03/17/21 0428  NA 141 138 135 136  K 2.2* 2.3* 5.5* 2.1*  CL 109 105 106 104  CO2  --  11* 16* 16*  GLUCOSE 168* 173* 111* 95  BUN '7 8 8 6  '$ CREATININE 1.10* 1.36* 0.94 1.00  CALCIUM  --  9.3 8.5* 9.0  MG  --  2.1  --   --     GFR: Estimated Creatinine Clearance: 64.4 mL/min (by C-G formula based on SCr of 1 mg/dL).  Liver Function Tests: Recent Labs  Lab 03/16/21 1022 03/17/21 0428  AST 41 31  ALT 19 16  ALKPHOS 83 76  BILITOT 0.8 0.8  PROT 7.9 7.7  ALBUMIN 3.6 3.7    CBG: Recent Labs  Lab 03/16/21 0944  GLUCAP 177*     Recent Results (from the past 240 hour(s))  Resp Panel by RT-PCR (Flu A&B, Covid) Nasopharyngeal Swab     Status: None   Collection Time: 03/16/21 11:43 AM   Specimen: Nasopharyngeal Swab; Nasopharyngeal(NP) swabs in vial transport medium  Result Value Ref Range Status   SARS Coronavirus 2 by RT PCR NEGATIVE NEGATIVE Final    Comment: (NOTE) SARS-CoV-2  target nucleic acids are NOT DETECTED.  The SARS-CoV-2 RNA is generally detectable in upper respiratory specimens during the acute  phase of infection. The lowest concentration of SARS-CoV-2 viral copies this assay can detect is 138 copies/mL. A negative result does not preclude SARS-Cov-2 infection and should not be used as the sole basis for treatment or other patient management decisions. A negative result may occur with  improper specimen collection/handling, submission of specimen other than nasopharyngeal swab, presence of viral mutation(s) within the areas targeted by this assay, and inadequate number of viral copies(<138 copies/mL). A negative result must be combined with clinical observations, patient history, and epidemiological information. The expected result is Negative.  Fact Sheet for Patients:  EntrepreneurPulse.com.au  Fact Sheet for Healthcare Providers:  IncredibleEmployment.be  This test is no t yet approved or cleared by the [place] FDA and  has been authorized for detection and/or diagnosis of SARS-CoV-2 by FDA under an Emergency Use Authorization (EUA). This EUA will remain  in effect (meaning this test can be used) for the duration of the COVID-19 declaration under Section 564(b)(1) of the Act, 21 U.S.C.section 360bbb-3(b)(1), unless the authorization is terminated  or revoked sooner.       Influenza A by PCR NEGATIVE NEGATIVE Final   Influenza B by PCR NEGATIVE NEGATIVE Final    Comment: (NOTE) The Xpert Xpress SARS-CoV-2/FLU/RSV plus assay is intended as an aid in the diagnosis of influenza from Nasopharyngeal swab specimens and should not be used as a sole basis for treatment. Nasal washings and aspirates are unacceptable for Xpert Xpress SARS-CoV-2/FLU/RSV testing.  Fact Sheet for Patients: EntrepreneurPulse.com.au  Fact Sheet for Healthcare  Providers: IncredibleEmployment.be  This test is not yet approved or cleared by the [place] FDA and has been authorized for detection and/or diagnosis of SARS-CoV-2 by FDA under an Emergency Use Authorization (EUA). This EUA will remain in effect (meaning this test can be used) for the duration of the COVID-19 declaration under Section 564(b)(1) of the Act, 21 U.S.C. section 360bbb-3(b)(1), unless the authorization is terminated or revoked.  Performed at [facility], [place] [address] [place], [place] [address]   CSF culture w Gram Stain     Status: None (Preliminary result)   Collection Time: 03/16/21  4:39 PM   Specimen: CSF; Cerebrospinal Fluid  Result Value Ref Range Status   Specimen Description CSF  Final   Special Requests NONE  Final   Gram Stain   Final    WBC PRESENT, PREDOMINANTLY MONONUCLEAR NO ORGANISMS SEEN CYTOSPIN SMEAR    Culture   Final    NO GROWTH < 24 HOURS Performed at [facility], [place] [address], [place], [place] [address]    Report Status PENDING  Incomplete         Radiology Studies: CT ABDOMEN PELVIS WO CONTRAST  Result Date: 03/16/2021 CLINICAL DATA:  Abdominal pain EXAM: CT ABDOMEN AND PELVIS WITHOUT CONTRAST TECHNIQUE: Multidetector CT imaging of the abdomen and pelvis was performed following the standard protocol without IV contrast. COMPARISON:  CT abdomen and pelvis dated April 8th 2022 FINDINGS: Lower chest: No acute abnormality. Hepatobiliary: No suspicious focal liver lesions. Gallbladder is distended, unchanged compared to prior CT. Limited evaluation of the bile ducts due to motion artifact. Pancreas: Limited evaluation of the pancreatic head due to motion artifact. No evidence of inflammatory change surrounding the pancreatic tail. Spleen: Normal in size without focal abnormality. Adrenals/Urinary Tract: No hydronephrosis. Punctate nonobstructing stone in the lower pole of the right kidney. 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 Normal bladder.  Stomach/Bowel: No evidence of obstruction. Limited evaluation for bowel wall thickening, especially in the upper abdomen due to motion artifact. Normal appendix. Vascular/Lymphatic: Aortic atherosclerosis. No enlarged abdominal or pelvic lymph nodes. Reproductive: Fibroid uterus. Other: No abdominal wall hernia or abnormality. No abdominopelvic ascites. Musculoskeletal: No acute or significant osseous findings. IMPRESSION: Limited exam due to motion artifact, per tech report best images obtainable. No CT findings to explain abdominal pain. Hydropic gallbladder, unchanged compared to prior CT. Fibroid uterus. Electronically Signed   By: Yetta Glassman MD   On: 03/16/2021 14:52   CT HEAD WO CONTRAST  Result Date: 03/16/2021 CLINICAL DATA:  Mental status change, unknown cause seizure, altered mental status EXAM: CT HEAD WITHOUT CONTRAST TECHNIQUE: Contiguous axial images were obtained from the base of the skull through the vertex without intravenous contrast. COMPARISON:  CT head 12/09/2006 FINDINGS: Brain: There is no evidence of acute intracranial hemorrhage, extra-axial fluid collection, or infarct. The ventricles are not enlarged. There is no midline shift. There is no mass lesion. Incidental note is made of a cavum septum pellucidum. Vascular: No hyperdense vessel or unexpected calcification. Skull: Normal. Negative for fracture or focal lesion. Sinuses/Orbits: The imaged paranasal sinuses are clear. The imaged orbits and globes are unremarkable. Other: The mastoid air cells are clear. IMPRESSION: No acute intracranial pathology or epileptogenic focus identified. Electronically Signed   By: Valetta Mole MD   On: 03/16/2021 10:39   MR BRAIN WO CONTRAST  Result Date: 03/16/2021 CLINICAL DATA:  Seizure, abnormal neuro exam. EXAM: MRI HEAD WITHOUT CONTRAST TECHNIQUE: Multiplanar, multiecho pulse sequences of the  brain and surrounding structures were obtained without intravenous contrast. COMPARISON:  Prior head CT examinations 03/16/2021 and earlier. FINDINGS: Brain: Mild intermittent motion degradation. Mild generalized cerebral and cerebellar atrophy. There is diffusion weighted signal abnormality within the medial right temporal lobe/hippocampus, extending posteriorly to the crus of the fornix (for instance as seen on series 2, images 21-26). Subtle asymmetric T2/FLAIR hyperintensity is also questioned at this site. No appreciable asymmetric hippocampal volume loss. Moderate and age-advanced multifocal T2/FLAIR hyperintensity within the cerebral white matter, nonspecific but most often secondary to chronic small vessel ischemia. No chronic intracranial blood products. No extra-axial fluid collection. No midline shift. Incidentally noted cavum septum pellucidum and cavum vergae. Vascular: Maintained proximal large arterial flow voids. Skull and upper cervical spine: No focal marrow lesion Sinuses/Orbits: Visualized orbits show no acute finding. No significant paranasal sinus disease at the imaged levels. Impression #2 will be called to the ordering clinician or representative by the Radiologist Assistant, and communication documented in the PACS or [employer]. IMPRESSION: Mildly motion degraded exam. Diffusion-weighted and possible T2/FLAIR signal abnormality within the medial right temporal lobe/hippocampus and crus of fornix. Primary considerations are acute signal changes related to recent seizure and/or signal changes related to acute encephalitis (including HSV encephalitis). Correlate clinically, and with CSF analysis as warranted. Additionally, short-interval MRI follow-up is recommended to ensure resolution of this signal abnormality, and to exclude an underlying mass/lesion at this site. Moderate and age-advanced multifocal T2/FLAIR hyperintense signal changes within the cerebral white matter, nonspecific  but most often secondary to chronic small vessel ischemia. Mild generalized parenchymal atrophy. Electronically Signed   By: Kellie Simmering DO   On: 03/16/2021 14:22   EEG adult  Result Date: 03/16/2021 Lora Havens, MD     03/16/2021  3:31 PM Patient Name: Judy Meyer MRN: [id number] Epilepsy Attending: Lora Havens Referring Physician/Provider: Dr Amie Portland Date: 03/16/2021 Duration: 27.21 mins  Patient history: 60 year old woman with history of seizures not on antiepileptics who presented with seizure-like activity.  EEG to evaluate for seizures. Level of alertness: Awake AEDs during EEG study: Keppra Technical aspects: This EEG study was done with scalp electrodes positioned according to the 10-20 International system of electrode placement. Electrical activity was acquired at a sampling rate of '500Hz'$  and reviewed with a high frequency filter of '70Hz'$  and a low frequency filter of '1Hz'$ . EEG data were recorded continuously and digitally stored. Description: The posterior dominant rhythm consists of 8 Hz activity of moderate voltage (25-35 uV) seen predominantly in posterior head regions, symmetric and reactive to eye opening and eye closing. Lateralized periodic discharges were noted arising from right hemisphere, maximal right frontotemporal region at 1455 with some evolution in frequency.  However routine EEG ended and no definite evolution was noted in morphology or space. Hyperventilation and photic stimulation were not performed.   ABNORMALITY - Lateralized periodic discharges ( LPD ) right hemisphere, maximal right frontotemporal region IMPRESSION: This study showed evidence of epileptogenicity arising from right hemisphere, maximal right frontotemporal region likely due to underlying structural abnormality with high potential for seizure recurrence.  One episode was noted at 1455 during which lateralized periodic discharges appear to evolve in frequency.  However, routine EEG ended and no definite  evolution was noted in morphology or space. Dr Rory Percy was notified. Recommend long-term monitoring for further evaluation Priyanka O Yadav   Overnight EEG with video  Result Date: 03/17/2021 Lora Havens, MD     03/17/2021 11:48 AM Patient Name: Judy Meyer MRN: [id number] Epilepsy Attending: Lora Havens Referring Physician/Provider: Dr Amie Portland Duration: 03/16/2021 1506 to 03/17/2021 1036  Patient history: 60 year old woman with history of seizures not on antiepileptics who presented with seizure-like activity.  EEG to evaluate for seizures.  Level of alertness: Awake, asleep  AEDs during EEG study: Keppra  Technical aspects: This EEG study was done with scalp electrodes positioned according to the 10-20 International system of electrode placement. Electrical activity was acquired at a sampling rate of '500Hz'$  and reviewed with a high frequency filter of '70Hz'$  and a low frequency filter of '1Hz'$ . EEG data were recorded continuously and digitally stored.  Description: The posterior dominant rhythm consists of 8 Hz activity of moderate voltage (25-35 uV) seen predominantly in posterior head regions, symmetric and reactive to eye opening and eye closing.  Sleep was characterized by sleep spindles (12 to 14 Hz), maximum frontocentral region. Lateralized periodic discharges were noted arising from right hemisphere, which at times appeared rhythmic with fluctuating frequency between 1 to 2 Hz without definite evolution.  EEG also showed continuous 3 to 5 Hz theta-delta slowing in right hemisphere.  Hyperventilation and photic stimulation were not performed.    ABNORMALITY - Lateralized periodic discharges ( LPD+R) right hemisphere -Continuous slow, right hemisphere  IMPRESSION: This study showed evidence of epileptogenicity and cortical dysfunction arising from right hemisphere, likely due to underlying structural abnormality.  The lateralized periodic discharges were rhythmic at times with fluctuating frequency  between 1 to 2 Hz without definite evolution.  This EEG pattern is on the ictal-interictal continuum with high potential for seizure recurrence.   Priyanka [name]        Scheduled Meds:  amLODipine  10 mg Oral Daily   enoxaparin (LOVENOX) injection  40 mg Subcutaneous Q24H   lactulose  30 g Oral BID   levothyroxine  75 mcg Oral Q0600   norethindrone-ethinyl estradiol  1 tablet Oral  Daily   pantoprazole  40 mg Oral Daily   senna-docusate  1 tablet Oral BID   venlafaxine XR  75 mg Oral Daily   Continuous Infusions:  sodium chloride 100 mL/hr at 03/16/21 2108   acyclovir Stopped (03/17/21 1620)   lactated ringers 75 mL/hr at 03/17/21 1633   levETIRAcetam Stopped (03/17/21 1035)     LOS: 1 day   Time spent: 20mns Greater than 50% of this time was spent in counseling, explanation of diagnosis, planning of further management, and coordination of care.   Voice Recognition /Viviann Sparedictation system was used to create this note, attempts have been made to correct errors. Please contact the author with questions and/or clarifications.   FFlorencia Reasons MD PhD FACP [facility]  Available via Epic secure chat 7am-7pm for nonurgent issues Please page for urgent issues To page the attending provider between 7A-7P or the covering provider during after hours 7P-7A, please log into the web site [email or web address] and access using universal [place] password for that web site. If you do not have the password, please call the hospital operator.    03/17/2021, 4:34 PM

## 2021-03-17 NOTE — Procedures (Addendum)
Patient Name: Judy Meyer  MRN: [id number]  Epilepsy Attending: Lora Havens  Referring Physician/Provider: Dr Amie Portland Duration: 03/16/2021 1506 to 03/17/2021 1036   Patient history: 60 year old woman with history of seizures not on antiepileptics who presented with seizure-like activity.  EEG to evaluate for seizures.   Level of alertness: Awake, asleep   AEDs during EEG study: Keppra   Technical aspects: This EEG study was done with scalp electrodes positioned according to the 10-20 International system of electrode placement. Electrical activity was acquired at a sampling rate of '500Hz'$  and reviewed with a high frequency filter of '70Hz'$  and a low frequency filter of '1Hz'$ . EEG data were recorded continuously and digitally stored.   Description: The posterior dominant rhythm consists of 8 Hz activity of moderate voltage (25-35 uV) seen predominantly in posterior head regions, symmetric and reactive to eye opening and eye closing.  Sleep was characterized by sleep spindles (12 to 14 Hz), maximum frontocentral region. Lateralized periodic discharges were noted arising from right hemisphere, which at times appeared rhythmic with fluctuating frequency between 1 to 2 Hz without definite evolution.  EEG also showed continuous 3 to 5 Hz theta-delta slowing in right hemisphere.  Hyperventilation and photic stimulation were not performed.      ABNORMALITY - Lateralized periodic discharges ( LPD+R) right hemisphere -Continuous slow, right hemisphere   IMPRESSION: This study showed evidence of epileptogenicity and cortical dysfunction arising from right hemisphere, likely due to underlying structural abnormality.  The lateralized periodic discharges were rhythmic at times with fluctuating frequency between 1 to 2 Hz without definite evolution.  This EEG pattern is on the ictal-interictal continuum with high potential for seizure recurrence.      Judy Meyer

## 2021-03-17 NOTE — Progress Notes (Addendum)
Neurology Progress Note   S:// Seen and examined Much more awake-no more weakness on the left side.   O:// Current vital signs: BP 115/71   Pulse 93   Temp 98.7 F (37.1 C) (Oral)   Resp 19   Ht '5\' 6"'$  (1.676 m)   Wt 77.4 kg   LMP 06/11/2018   SpO2 98%   BMI 27.54 kg/m  Vital signs in last 24 hours: Temp:  [98.7 F (37.1 C)] 98.7 F (37.1 C) (08/09 0747) Pulse Rate:  [93-108] 93 (08/09 0830) Resp:  [14-26] 19 (08/09 0830) BP: (115-173)/(67-114) 115/71 (08/09 0830) SpO2:  [95 %-100 %] 98 % (08/09 0830) Weight:  [77.4 kg] 77.4 kg (08/08 1522) General: Awake, alert, somewhat slow to respond to questions HEENT: Normocephalic/atraumatic CVS: Regular rhythm Respiratory: Breathing well saturating normally on room Abdomen nondistended nontender Neurological exam She is awake, appears alert but is slow to respond to questions She is able to tell me her name, correct age, correct month. She is able to repeat sentences without problems. She is able to follow simple commands Cranial nerves: Pupils equal round react light, extraocular movements intact, visual fields appear full, face appears symmetric, tongue and palate midline Motor examination with bilateral upper extremities antigravity without drift.  Bilateral lower extremities are equally weak with mild drift with no focality. Sensation intact to touch without extinction Coordination: Difficult to assess due to her slow movements but no obvious dysmetria   Medications  Current Facility-Administered Medications:    0.9 %  sodium chloride infusion, , Intravenous, Continuous, Orma Flaming, MD, Last Rate: 100 mL/hr at 03/16/21 2108, Rate Change at 03/16/21 2108   acetaminophen (TYLENOL) tablet 650 mg, 650 mg, Oral, Q6H PRN **OR** acetaminophen (TYLENOL) suppository 650 mg, 650 mg, Rectal, Q6H PRN, Orma Flaming, MD   acyclovir (ZOVIRAX) 775 mg in dextrose 5 % 150 mL IVPB, 10 mg/kg, Intravenous, Q8H, Heloise Purpura, RPH,  Stopped at 03/17/21 0745   amLODipine (NORVASC) tablet 10 mg, 10 mg, Oral, Daily, Orma Flaming, MD, 10 mg at 03/17/21 0939   enoxaparin (LOVENOX) injection 40 mg, 40 mg, Subcutaneous, Q24H, Orma Flaming, MD, 40 mg at 03/16/21 2106   HYDROcodone-acetaminophen (NORCO/VICODIN) 5-325 MG per tablet 1 tablet, 1 tablet, Oral, Q4H PRN, Orma Flaming, MD, 1 tablet at 03/16/21 2157   lactulose [facility]) enema 200 gm, 300 mL, Rectal, BID, [place], Wylder S, PA, 300 mL at 03/16/21 2118   levETIRAcetam (KEPPRA) IVPB 500 mg/100 mL premix, 500 mg, Intravenous, Q12H, Amie Portland, MD, Last Rate: 400 mL/hr at 03/17/21 0936, 500 mg at 03/17/21 0936   levothyroxine (SYNTHROID) tablet 75 mcg, 75 mcg, Oral, Q0600, Orma Flaming, MD, 75 mcg at 03/17/21 0804   morphine 2 MG/ML injection 2 mg, 2 mg, Intravenous, Q4H PRN, Mansy, Jan A, MD, 2 mg at 03/17/21 0005   norethindrone-ethinyl estradiol (CYCLAFEM) tablet 1 tablet, 1 tablet, Oral, Daily, Orma Flaming, MD   ondansetron [facility]) injection 4 mg, 4 mg, Intravenous, Q4H PRN, Mansy, Jan A, MD, 4 mg at 03/17/21 0806   potassium chloride 10 mEq in 100 mL IVPB, 10 mEq, Intravenous, Q1 Hr x 6, Florencia Reasons, MD, Last Rate: 100 mL/hr at 03/17/21 0939, 10 mEq at 03/17/21 0939   venlafaxine XR (EFFEXOR-XR) 24 hr capsule 75 mg, 75 mg, Oral, Daily, Orma Flaming, MD  Current Outpatient Medications:    albuterol (VENTOLIN HFA) 108 (90 Base) MCG/ACT inhaler, Inhale 2 puffs into the lungs 4 (four) times daily as needed for wheezing or shortness  of breath., Disp: , Rfl:    amLODipine (NORVASC) 10 MG tablet, Take 1 tablet (10 mg total) by mouth daily., Disp: 90 tablet, Rfl: 0   aspirin-acetaminophen-caffeine (EXCEDRIN MIGRAINE) 250-250-65 MG tablet, Take 1 tablet by mouth every 6 (six) hours as needed for headache., Disp: , Rfl:    Galcanezumab-gnlm (EMGALITY) 120 MG/ML SOAJ, Inject 120 mg into the skin every 30 (thirty) days., Disp: , Rfl:    ibuprofen (ADVIL) 200 MG tablet,  Take 400 mg by mouth every 6 (six) hours as needed for headache (pain)., Disp: , Rfl:    levothyroxine (SYNTHROID) 50 MCG tablet, Take 1 tablet (50 mcg total) by mouth daily at 6 (six) AM., Disp: 90 tablet, Rfl: 0   norethindrone-ethinyl estradiol (NORTREL 7/7/7) 0.5/0.75/1-35 MG-MCG tablet, Take 1 tablet by mouth daily., Disp: , Rfl:    ondansetron (ZOFRAN) 4 MG tablet, Take 1 tablet (4 mg total) by mouth daily as needed for nausea or vomiting., Disp: 30 tablet, Rfl: 1   venlafaxine XR (EFFEXOR-XR) 75 MG 24 hr capsule, Take 1 capsule (75 mg total) by mouth daily., Disp: 90 capsule, Rfl: 0   cholestyramine light (PREVALITE) 4 g packet, Take 1 packet (4 g total) by mouth every 12 (twelve) hours. (Patient not taking: Reported on 03/16/2021), Disp: 60 packet, Rfl: 0   folic acid (FOLVITE) 1 MG tablet, Take 1 tablet (1 mg total) by mouth daily. (Patient not taking: No sig reported), Disp: 90 tablet, Rfl: 1   methocarbamol (ROBAXIN) 500 MG tablet, Take 1 tablet (500 mg total) by mouth every 8 (eight) hours as needed for muscle spasms. (Patient not taking: No sig reported), Disp: 15 tablet, Rfl: 0   Multiple Vitamin (MULTIVITAMIN WITH MINERALS) TABS tablet, Take 1 tablet by mouth daily. (Patient not taking: No sig reported), Disp: 90 tablet, Rfl: 1   pantoprazole (PROTONIX) 40 MG tablet, Take 1 tablet (40 mg total) by mouth daily. (Patient not taking: No sig reported), Disp: 30 tablet, Rfl: 0   sodium bicarbonate 650 MG tablet, Take 1 tablet (650 mg total) by mouth 3 (three) times daily. (Patient not taking: No sig reported), Disp: 30 tablet, Rfl: 0   thiamine 100 MG tablet, Take 1 tablet (100 mg total) by mouth daily. (Patient not taking: No sig reported), Disp: 90 tablet, Rfl: 0   ursodiol (ACTIGALL) 250 MG tablet, Take 1 tablet (250 mg total) by mouth 3 (three) times daily. (Patient not taking: No sig reported), Disp: 120 tablet, Rfl: 1 Labs CBC    Component Value Date/Time   WBC 10.1 03/17/2021 0428    RBC 3.75 (L) 03/17/2021 0428   HGB 10.1 (L) 03/17/2021 0428   HCT 34.5 (L) 03/17/2021 0428   PLT 433 (H) 03/17/2021 0428   MCV 92.0 03/17/2021 0428   MCH 26.9 03/17/2021 0428   MCHC 29.3 (L) 03/17/2021 0428   RDW 15.9 (H) 03/17/2021 0428   LYMPHSABS 12.1 (H) 03/16/2021 1022   MONOABS 0.9 03/16/2021 1022   EOSABS 0.0 03/16/2021 1022   BASOSABS 0.0 03/16/2021 1022    CMP     Component Value Date/Time   NA 136 03/17/2021 0428   K 2.1 (LL) 03/17/2021 0428   CL 104 03/17/2021 0428   CO2 16 (L) 03/17/2021 0428   GLUCOSE 95 03/17/2021 0428   BUN 6 03/17/2021 0428   CREATININE 1.00 03/17/2021 0428   CALCIUM 9.0 03/17/2021 0428   PROT 7.7 03/17/2021 0428   ALBUMIN 3.7 03/17/2021 0428   AST 31 03/17/2021 0428  ALT 16 03/17/2021 0428   ALKPHOS 76 03/17/2021 0428   BILITOT 0.8 03/17/2021 0428   GFRNONAA >60 03/17/2021 0428   GFRAA >60 04/07/2020 0601    Imaging I have reviewed images in epic and the results pertinent to this consultation are: No new imaging to review EEG from overnight suggestive of epileptogenicity and cortical dysfunction from the right hemisphere likely due to underlying structural abnormality.  Lateralized periodic discharges with rhythmic at times with fluctuating frequency between 1 to 2 Hz without definitive evolution.  This pattern is on the ictal interictal continuum with high potential for seizure recurrence.  Assessment: 60 year old woman brought in for evaluation of seizure activity in her bed with left arm and leg stiffening followed by whole body shaking.  Remained unresponsive for a prolonged duration of time.  Imaging revealed right temporal DWI/flair signal abnormality concerning for ongoing status epilepticus versus HSV encephalitis. Loaded with Keppra, started on Keppra 500 twice daily. Spinal tap performed-normal protein, 1 cell.  Less likely to be HSV encephalitis and MRI finding more likely to be sequela of ongoing seizure activity/status  epilepticus. She continues to have lateralized periodic discharges from the right hemisphere but does not seem to be actively seizing. Also had ammonia elevated at 110. Unclear if the seizure is provoked-has a history of seizure in the remote past has not had a seizure in 15 years. CK remains elevated at 773, renal function improved.  Ammonia still 91.  Anchor EEG findings are likely secondary to the hepatic encephalopathy and hyperammonemia and more likely reflect interictal/metabolic derangement rather than true ictal pattern.  Also given that with some improvement in her ammonia her mentation is improved, is reassuring.  Low suspicion for HSV encephalitis-continue acyclovir till HSV PCR comes negative  Impression: Breakthrough seizure and status epilepticus in the setting of worsening hepatic encephalopathy-resolved Hepatic encephalopathy-improving Low suspicion for HSV encephalitis  Recommendations: C/W Keppra 500 BID Continue clinical exams with frequent neurochecks Management of hyperammonemia per primary team Discontinue LTM EEG.  If her exam worsens, can reevaluate with EEG at that time. Management of her general medical comorbidities per primary team as you are Continue acyclovir till the HSV PCR comes back negative. I will follow with you again tomorrow.  -- Amie Portland, MD Neurologist [facility] Pager: [phone]

## 2021-03-17 NOTE — ED Notes (Signed)
Pt screaming out for help on arrival into the room.  Pt cords all tangled and soiled.  Pt was cleaned up peri care and linens changed.  Cords adjusted pt. Repositioned meal tray here

## 2021-03-17 NOTE — ED Notes (Signed)
Pt needs continuous reminding to not bend her arm and what button to press when needing a nurse

## 2021-03-17 NOTE — Progress Notes (Signed)
D/C patient no skin breakdown noted.

## 2021-03-17 NOTE — ED Notes (Signed)
Breakfast Orders Placed °

## 2021-03-17 NOTE — Consult Note (Signed)
Referring Provider:  EDP Primary Care Physician:  Pcp, No Primary Gastroenterologist: Previously seen by [facility]/currently followed by [facility]  Reason for Consultation: Elevated ammonia level, seizure  HPI: Judy Meyer is a 60 y.o. female with past medical history of alcohol induced liver disease, history of abnormal LFTs currently presented to the hospital with possible seizure disorder.  She was found to have elevated ammonia.  Initially seen by neurology.  Currently undergoing further work-up for possible status epilepticus versus HSV encephalitis depending on MRI findings.  GI is consulted for further evaluation of elevated ammonia.  Patient with multiple hospitalization recently.  She was admitted to [facility] from April 29 to Dec 22, 2020 for worsening liver function.  Serological work-up, ultrasound and MRI MRCP was negative for acute changes.  Her PEth test was suggestive of excessive alcohol use prior to admission.  She was started on prednisone 40 mg daily.  Her LFTs on discharge on Dec 22, 2020 was T bili 23.1, AST 124, ALT 62 and alkaline phosphatase 145.    She was seen by [facility] NP on February 06, 2021.  Repeat blood work was recommended but looks like it was not done.  Blood work yesterday showed leukocytosis with white count of 19.3, stable hemoglobin at 10.7, normal INR at 1.1 and normal LFTs.  Repeat blood work this morning showed normal white count.  LFT remains normal.  Patient seen and examined at bedside.  She is alert and oriented at this time.  She is complaining of back pain but denies any other GI symptoms. Past Medical History:  Diagnosis Date   Hypertension    Hypothyroidism    Liver disease    Uterine fibroid     Past Surgical History:  Procedure Laterality Date   hand fracture surgery Right    LIVER BIOPSY      Prior to Admission medications   Medication Sig Start Date End Date Taking? Authorizing Provider  albuterol  (VENTOLIN HFA) 108 (90 Base) MCG/ACT inhaler Inhale 2 puffs into the lungs 4 (four) times daily as needed for wheezing or shortness of breath. 12/20/19  Yes [provider]  amLODipine (NORVASC) 10 MG tablet Take 1 tablet (10 mg total) by mouth daily. 11/21/20  Yes Mercy Riding, MD  aspirin-acetaminophen-caffeine (EXCEDRIN MIGRAINE) [id number] MG tablet Take 1 tablet by mouth every 6 (six) hours as needed for headache.   Yes [provider]  Galcanezumab-gnlm (EMGALITY) 120 MG/ML SOAJ Inject 120 mg into the skin every 30 (thirty) days.   Yes [provider]  ibuprofen (ADVIL) 200 MG tablet Take 400 mg by mouth every 6 (six) hours as needed for headache (pain).   Yes [provider]  levothyroxine (SYNTHROID) 50 MCG tablet Take 1 tablet (50 mcg total) by mouth daily at 6 (six) AM. 11/21/20  Yes Mercy Riding, MD  norethindrone-ethinyl estradiol (NORTREL 7/7/7) 0.5/0.75/1-35 MG-MCG tablet Take 1 tablet by mouth daily.   Yes [provider]  ondansetron (ZOFRAN) 4 MG tablet Take 1 tablet (4 mg total) by mouth daily as needed for nausea or vomiting. 11/20/20 11/20/21 Yes Mercy Riding, MD  venlafaxine XR (EFFEXOR-XR) 75 MG 24 hr capsule Take 1 capsule (75 mg total) by mouth daily. 11/20/20  Yes Mercy Riding, MD  cholestyramine light (PREVALITE) 4 g packet Take 1 packet (4 g total) by mouth every 12 (twelve) hours. Patient not taking: Reported on 03/16/2021 11/20/20 12/20/20  Mercy Riding, MD  folic acid (FOLVITE) 1 MG tablet Take 1 tablet (1 mg total) by mouth daily. Patient not taking: No sig reported 11/20/20   Mercy Riding, MD  methocarbamol (ROBAXIN) 500 MG tablet Take 1 tablet (500 mg total) by mouth every 8 (eight) hours as needed for muscle spasms. Patient not taking: No sig reported 11/20/20   Mercy Riding, MD  Multiple Vitamin (MULTIVITAMIN WITH MINERALS) TABS tablet Take 1 tablet by mouth daily. Patient not taking: No sig reported 11/21/20   Mercy Riding,  MD  pantoprazole (PROTONIX) 40 MG tablet Take 1 tablet (40 mg total) by mouth daily. Patient not taking: No sig reported 11/20/20   Mercy Riding, MD  sodium bicarbonate 650 MG tablet Take 1 tablet (650 mg total) by mouth 3 (three) times daily. Patient not taking: No sig reported 11/20/20   Mercy Riding, MD  thiamine 100 MG tablet Take 1 tablet (100 mg total) by mouth daily. Patient not taking: No sig reported 11/20/20   Mercy Riding, MD  ursodiol (ACTIGALL) 250 MG tablet Take 1 tablet (250 mg total) by mouth 3 (three) times daily. Patient not taking: No sig reported 11/20/20   Mercy Riding, MD    Scheduled Meds:  amLODipine  10 mg Oral Daily   enoxaparin (LOVENOX) injection  40 mg Subcutaneous Q24H   lactulose  300 mL Rectal BID   levothyroxine  75 mcg Oral Q0600   norethindrone-ethinyl estradiol  1 tablet Oral Daily   venlafaxine XR  75 mg Oral Daily   Continuous Infusions:  sodium chloride 100 mL/hr at 03/16/21 2108   acyclovir Stopped (03/17/21 0745)   levETIRAcetam Stopped (03/17/21 1035)   potassium chloride 10 mEq (03/17/21 0939)   PRN Meds:.acetaminophen **OR** acetaminophen, HYDROcodone-acetaminophen, morphine injection, ondansetron (ZOFRAN) IV  Allergies as of 03/16/2021 - Review Complete 03/16/2021  Allergen Reaction Noted   Compazine [prochlorperazine edisylate] Anaphylaxis 07/11/2018   Periactin [cyproheptadine] Anaphylaxis 07/11/2018   Cyclobenzaprine Other (See Comments) 07/11/2018   Haloperidol and related Other (See Comments) 07/11/2018   Metoclopramide Other (See Comments) 07/11/2018    Family History  Problem Relation Age of Onset   Hypertension Other    Hypertension Mother    High Cholesterol Mother     Social History   Socioeconomic History   Marital status: Single    Spouse name: Not on file   Number of children: Not on file   Years of education: Not on file   Highest education level: Not on file  Occupational History   Not on file  Tobacco  Use   Smoking status: Never   Smokeless tobacco: Never  Vaping Use   Vaping Use: Some days   Start date: 09/09/2020  Substance and Sexual Activity   Alcohol use: Not Currently    Comment: last drink was approximately 5 months ago   Drug use: Never   Sexual activity: Yes  Other Topics Concern   Not on file  Social History Narrative   Not on file   Social Determinants of Health   Financial Resource Strain: Not on file  Food Insecurity: Not on file  Transportation Needs: Not on file  Physical Activity: Not on file  Stress: Not on file  Social Connections: Not on file  Intimate Partner Violence: Not on file    Review of Systems: All negative except as stated above in HPI.  Physical Exam: Vital signs: Vitals:   03/17/21 0800 03/17/21 0830  BP: (!) 157/77 115/71  Pulse: Marland Kitchen)  103 93  Resp: 17 19  Temp:    SpO2: 95% 98%     General:   Alert,  Well-developed, well-nourished, pleasant and cooperative in NAD Lungs: No visible respiratory distress Heart:  Regular rate and rhythm; no murmurs, clicks, rubs,  or gallops. Abdomen: Soft, nontender, nondistended, bowel sounds present.  No peritoneal signs Rectal:  Deferred  GI:  Lab Results: Recent Labs    03/16/21 1007 03/16/21 1022 03/17/21 0428  WBC  --  19.3* 10.1  HGB 10.9* 10.7* 10.1*  HCT 32.0* 36.1 34.5*  PLT  --  564* 433*   BMET Recent Labs    03/16/21 1022 03/16/21 1601 03/17/21 0428  NA 138 135 136  K 2.3* 5.5* 2.1*  CL 105 106 104  CO2 11* 16* 16*  GLUCOSE 173* 111* 95  BUN '8 8 6  '$ CREATININE 1.36* 0.94 1.00  CALCIUM 9.3 8.5* 9.0   LFT Recent Labs    03/16/21 1022 03/17/21 0428  PROT 7.9 7.7  ALBUMIN 3.6 3.7  AST 41 31  ALT 19 16  ALKPHOS 83 76  BILITOT 0.8 0.8  BILIDIR 0.2  --   IBILI 0.6  --    PT/INR Recent Labs    03/16/21 1022  LABPROT 13.8  INR 1.1     Studies/Results: CT ABDOMEN PELVIS WO CONTRAST  Result Date: 03/16/2021 CLINICAL DATA:  Abdominal pain EXAM: CT ABDOMEN  AND PELVIS WITHOUT CONTRAST TECHNIQUE: Multidetector CT imaging of the abdomen and pelvis was performed following the standard protocol without IV contrast. COMPARISON:  CT abdomen and pelvis dated April 8th 2022 FINDINGS: Lower chest: No acute abnormality. Hepatobiliary: No suspicious focal liver lesions. Gallbladder is distended, unchanged compared to prior CT. Limited evaluation of the bile ducts due to motion artifact. Pancreas: Limited evaluation of the pancreatic head due to motion artifact. No evidence of inflammatory change surrounding the pancreatic tail. Spleen: Normal in size without focal abnormality. Adrenals/Urinary Tract: No hydronephrosis. Punctate nonobstructing stone in the lower pole of the right kidney. 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 Normal bladder. Stomach/Bowel: No evidence of obstruction. Limited evaluation for bowel wall thickening, especially in the upper abdomen due to motion artifact. Normal appendix. Vascular/Lymphatic: Aortic atherosclerosis. No enlarged abdominal or pelvic lymph nodes. Reproductive: Fibroid uterus. Other: No abdominal wall hernia or abnormality. No abdominopelvic ascites. Musculoskeletal: No acute or significant osseous findings. IMPRESSION: Limited exam due to motion artifact, per tech report best images obtainable. No CT findings to explain abdominal pain. Hydropic gallbladder, unchanged compared to prior CT. Fibroid uterus. Electronically Signed   By: Yetta Glassman MD   On: 03/16/2021 14:52   CT HEAD WO CONTRAST  Result Date: 03/16/2021 CLINICAL DATA:  Mental status change, unknown cause seizure, altered mental status EXAM: CT HEAD WITHOUT CONTRAST TECHNIQUE: Contiguous axial images were obtained from the base of the skull through the vertex without intravenous contrast. COMPARISON:  CT head 12/09/2006 FINDINGS: Brain: There is no evidence of acute intracranial hemorrhage, extra-axial fluid  collection, or infarct. The ventricles are not enlarged. There is no midline shift. There is no mass lesion. Incidental note is made of a cavum septum pellucidum. Vascular: No hyperdense vessel or unexpected calcification. Skull: Normal. Negative for fracture or focal lesion. Sinuses/Orbits: The imaged paranasal sinuses are clear. The imaged orbits and globes are unremarkable. Other: The mastoid air cells are clear. IMPRESSION: No acute intracranial pathology or epileptogenic focus identified. Electronically Signed   By: Collier Salina  Noone MD   On: 03/16/2021 10:39   MR BRAIN WO CONTRAST  Result Date: 03/16/2021 CLINICAL DATA:  Seizure, abnormal neuro exam. EXAM: MRI HEAD WITHOUT CONTRAST TECHNIQUE: Multiplanar, multiecho pulse sequences of the brain and surrounding structures were obtained without intravenous contrast. COMPARISON:  Prior head CT examinations 03/16/2021 and earlier. FINDINGS: Brain: Mild intermittent motion degradation. Mild generalized cerebral and cerebellar atrophy. There is diffusion weighted signal abnormality within the medial right temporal lobe/hippocampus, extending posteriorly to the crus of the fornix (for instance as seen on series 2, images 21-26). Subtle asymmetric T2/FLAIR hyperintensity is also questioned at this site. No appreciable asymmetric hippocampal volume loss. Moderate and age-advanced multifocal T2/FLAIR hyperintensity within the cerebral white matter, nonspecific but most often secondary to chronic small vessel ischemia. No chronic intracranial blood products. No extra-axial fluid collection. No midline shift. Incidentally noted cavum septum pellucidum and cavum vergae. Vascular: Maintained proximal large arterial flow voids. Skull and upper cervical spine: No focal marrow lesion Sinuses/Orbits: Visualized orbits show no acute finding. No significant paranasal sinus disease at the imaged levels. Impression #2 will be called to the ordering clinician or representative by the  Radiologist Assistant, and communication documented in the PACS or [employer]. IMPRESSION: Mildly motion degraded exam. Diffusion-weighted and possible T2/FLAIR signal abnormality within the medial right temporal lobe/hippocampus and crus of fornix. Primary considerations are acute signal changes related to recent seizure and/or signal changes related to acute encephalitis (including HSV encephalitis). Correlate clinically, and with CSF analysis as warranted. Additionally, short-interval MRI follow-up is recommended to ensure resolution of this signal abnormality, and to exclude an underlying mass/lesion at this site. Moderate and age-advanced multifocal T2/FLAIR hyperintense signal changes within the cerebral white matter, nonspecific but most often secondary to chronic small vessel ischemia. Mild generalized parenchymal atrophy. Electronically Signed   By: Kellie Simmering DO   On: 03/16/2021 14:22   EEG adult  Result Date: 03/16/2021 Lora Havens, MD     03/16/2021  3:31 PM Patient Name: SAMANTA SUMIDA MRN: [id number] Epilepsy Attending: Lora Havens Referring Physician/Provider: Dr Amie Portland Date: 03/16/2021 Duration: 27.21 mins Patient history: 60 year old woman with history of seizures not on antiepileptics who presented with seizure-like activity.  EEG to evaluate for seizures. Level of alertness: Awake AEDs during EEG study: Keppra Technical aspects: This EEG study was done with scalp electrodes positioned according to the 10-20 International system of electrode placement. Electrical activity was acquired at a sampling rate of '500Hz'$  and reviewed with a high frequency filter of '70Hz'$  and a low frequency filter of '1Hz'$ . EEG data were recorded continuously and digitally stored. Description: The posterior dominant rhythm consists of 8 Hz activity of moderate voltage (25-35 uV) seen predominantly in posterior head regions, symmetric and reactive to eye opening and eye closing. Lateralized periodic  discharges were noted arising from right hemisphere, maximal right frontotemporal region at 1455 with some evolution in frequency.  However routine EEG ended and no definite evolution was noted in morphology or space. Hyperventilation and photic stimulation were not performed.   ABNORMALITY - Lateralized periodic discharges ( LPD ) right hemisphere, maximal right frontotemporal region IMPRESSION: This study showed evidence of epileptogenicity arising from right hemisphere, maximal right frontotemporal region likely due to underlying structural abnormality with high potential for seizure recurrence.  One episode was noted at 1455 during which lateralized periodic discharges appear to evolve in frequency.  However, routine EEG ended and no definite evolution was noted in morphology or space. Dr Rory Percy was notified.  Recommend long-term monitoring for further evaluation Priyanka O Yadav   Overnight EEG with video  Result Date: 03/17/2021 Lora Havens, MD     03/17/2021  8:48 AM Patient Name: DEVONTE SHOPE MRN: [id number] Epilepsy Attending: Lora Havens Referring Physician/Provider: Dr Amie Portland Duration: 03/16/2021 1506 to 03/17/2021 0830  Patient history: 60 year old woman with history of seizures not on antiepileptics who presented with seizure-like activity.  EEG to evaluate for seizures.  Level of alertness: Awake, asleep  AEDs during EEG study: Keppra  Technical aspects: This EEG study was done with scalp electrodes positioned according to the 10-20 International system of electrode placement. Electrical activity was acquired at a sampling rate of '500Hz'$  and reviewed with a high frequency filter of '70Hz'$  and a low frequency filter of '1Hz'$ . EEG data were recorded continuously and digitally stored.  Description: The posterior dominant rhythm consists of 8 Hz activity of moderate voltage (25-35 uV) seen predominantly in posterior head regions, symmetric and reactive to eye opening and eye closing.  Sleep was  characterized by sleep spindles (12 to 14 Hz), maximum frontocentral region. Lateralized periodic discharges were noted arising from right hemisphere, which at times appeared rhythmic with fluctuating frequency between 1 to 2 Hz without definite evolution.  EEG also showed continuous 3 to 5 Hz theta-delta slowing in right hemisphere.  Hyperventilation and photic stimulation were not performed.    ABNORMALITY - Lateralized periodic discharges ( LPD+R) right hemisphere -Continuous slow, right hemisphere  IMPRESSION: This study showed evidence of epileptogenicity and cortical dysfunction arising from right hemisphere, likely due to underlying structural abnormality.  The lateralized periodic discharges were rhythmic at times with fluctuating frequency between 1 to 2 Hz without definite evolution.  This EEG pattern is on the ictal-interictal continuum with high potential for seizure recurrence.   Priyanka [name]    Impression/Plan: -Seizure-like activity in a patient with remote history of seizure disorder.  Imaging finding concerning for status epilepticus versus HSV encephalitis.  Currently followed by neurology. -Elevated ammonia.  Nonspecific.  Patient with normal LFTs at this time.  Normal INR.  Recommendations ------------------------ -Patient's seizure-like activity is most likely from neurological disorder.  Her liver tests are normal.  She has normal INR.  Elevated ammonia level is nonspecific and I do not think is the cause for her seizure-like activity. -Patient is alert and oriented at this time. -Continue further work-up per neurology -Add p.o. lactulose.  Hold for diarrhea -GI will sign off.  Call us back if needed.  Follow-up with [facility] as previously recommended.    LOS: 1 day   Otis Brace  MD, FACP 03/17/2021, 11:00 AM  Contact #  [phone]

## 2021-03-17 NOTE — ED Notes (Signed)
Pt in bed, moving around fiddling with wires, bedding destroyed. Pt a/ox4, but per off going RN, pt needs constant redirection to not pull at wires/IV. Pt redirectable. Pt gown, sheets, diaper, chux changed. IV reinforced and re-wrapped. Pt requesting pain medicine. Pt otherwise denies CP, SOB, ABD pain, n/v. + diarrhea s/p lactulose

## 2021-03-18 DIAGNOSIS — N179 Acute kidney failure, unspecified: Secondary | ICD-10-CM

## 2021-03-18 DIAGNOSIS — E872 Acidosis: Secondary | ICD-10-CM

## 2021-03-18 LAB — BASIC METABOLIC PANEL
Anion gap: 15 (ref 5–15)
BUN: 6 mg/dL (ref 6–20)
CO2: 14 mmol/L — ABNORMAL LOW (ref 22–32)
Calcium: 9.7 mg/dL (ref 8.9–10.3)
Chloride: 108 mmol/L (ref 98–111)
Creatinine, Ser: 1.01 mg/dL — ABNORMAL HIGH (ref 0.44–1.00)
GFR, Estimated: >60 mL/min (ref >60)
Glucose, Bld: 104 mg/dL — ABNORMAL HIGH (ref 70–99)
Potassium: 2.6 mmol/L — CL (ref 3.5–5.1)
Sodium: 137 mmol/L (ref 135–145)

## 2021-03-18 LAB — POTASSIUM: Potassium: 2.7 mmol/L — CL (ref 3.5–5.1)

## 2021-03-18 LAB — CBC
HCT: 33.4 % — ABNORMAL LOW (ref 36.0–46.0)
Hemoglobin: 10.7 g/dL — ABNORMAL LOW (ref 12.0–15.0)
MCH: 27.6 pg (ref 26.0–34.0)
MCHC: 32 g/dL (ref 30.0–36.0)
MCV: 86.1 fL (ref 80.0–100.0)
Platelets: 479 10*3/uL — ABNORMAL HIGH (ref 150–400)
RBC: 3.88 MIL/uL (ref 3.87–5.11)
RDW: 15.9 % — ABNORMAL HIGH (ref 11.5–15.5)
WBC: 10.8 10*3/uL — ABNORMAL HIGH (ref 4.0–10.5)
nRBC: 0 % (ref 0.0–0.2)

## 2021-03-18 LAB — HSV 1/2 PCR, CSF
HSV-1 DNA: NEGATIVE
HSV-2 DNA: NEGATIVE

## 2021-03-18 LAB — AMMONIA: Ammonia: 67 umol/L — ABNORMAL HIGH (ref 9–35)

## 2021-03-18 MED ORDER — POTASSIUM CHLORIDE 10 MEQ/100ML IV SOLN
10.0000 meq | INTRAVENOUS | Status: AC
  Administered 2021-03-18 (×4): 10 meq via INTRAVENOUS
  Filled 2021-03-18 (×5): qty 100

## 2021-03-18 MED ORDER — POTASSIUM CHLORIDE 20 MEQ PO PACK
40.0000 meq | PACK | Freq: Once | ORAL | Status: AC
  Administered 2021-03-18: 40 meq via ORAL
  Filled 2021-03-18: qty 2

## 2021-03-18 MED ORDER — POTASSIUM CHLORIDE 10 MEQ/100ML IV SOLN
10.0000 meq | INTRAVENOUS | Status: AC
  Administered 2021-03-18 (×2): 10 meq via INTRAVENOUS
  Filled 2021-03-18: qty 100

## 2021-03-18 MED ORDER — POTASSIUM CHLORIDE CRYS ER 20 MEQ PO TBCR
20.0000 meq | EXTENDED_RELEASE_TABLET | ORAL | Status: AC
Start: 2021-03-18 — End: 2021-03-18
  Administered 2021-03-18 (×4): 20 meq via ORAL
  Filled 2021-03-18 (×4): qty 1

## 2021-03-18 MED ORDER — POTASSIUM CHLORIDE 20 MEQ PO PACK: 40.0000 meq | PACK | Freq: Once | ORAL | Status: DC

## 2021-03-18 NOTE — Evaluation (Signed)
Physical Therapy Evaluation Patient Details Name: Judy Meyer MRN: [id number] DOB: 02/20/1961 Today's Date: 03/18/2021   History of Present Illness  59 y/o female presented to ED on 8/8 following seizure x 2. CT head negative. MRI motion degraded but possibel T2/FLAIR signal abnormality within medial R temporal lobe/hippocampus and crus of fornix. EEG showed evidence of epileptogenicity arising from R hemisphere, maximal R frontotemporal region likely due to underlying structural abnormality with high potential for seizure recurrence. PMH: HTN, hypothyroidism, liver disease, uterine fibroid, active steatohepatitis, cholestasis, seizures.  Clinical Impression  PTA, patient lives with boyfriend and reports independence with mobility. Reports numerous falls due to boyfriend being a hoarder and has tight spaces to ambulate through. Patient currently requiring supervision for mobility with no AD due to mild balance deficits noted during ambulation. Patient presents with generalized weakness, impaired coordination, impaired balance, and decreased activity tolerance. Patient will benefit from skilled PT services during acute stay to address listed deficits. Recommend HHPT following discharge to maximize functional mobility and safety in the home.     Follow Up Recommendations Home health PT;Supervision for mobility/OOB    Equipment Recommendations  None recommended by PT    Recommendations for Other Services       Precautions / Restrictions Precautions Precautions: Fall Precaution Comments: seizure Restrictions Weight Bearing Restrictions: No      Mobility  Bed Mobility Overal bed mobility: Modified Independent                  Transfers Overall transfer level: Needs assistance Equipment used: None Transfers: Sit to/from Stand Sit to Stand: Supervision         General transfer comment: supervision for safety. Unsteady initially but patient able to self  correct  Ambulation/Gait Ambulation/Gait assistance: Supervision Gait Distance (Feet): 200 Feet Assistive device: None Gait Pattern/deviations: Step-through pattern;Decreased stride length;Ataxic;Drifts right/left;Wide base of support;Narrow base of support Gait velocity: decreased   General Gait Details: varying BOS throughout ambulation. Supervision for safety. Drifting L/R with no overt LOB but mild instability.  Stairs Stairs: Yes Stairs assistance: Supervision Stair Management: Two rails;Alternating pattern;Forwards Number of Stairs: 2 General stair comments: supervision for safety  Wheelchair Mobility    Modified Rankin (Stroke Patients Only)       Balance Overall balance assessment: Mild deficits observed, not formally tested                                           Pertinent Vitals/Pain Pain Assessment: No/denies pain    Home Living Family/patient expects to be discharged to:: Private residence Living Arrangements: Spouse/significant other Available Help at Discharge: Family Type of Home: House Home Access: Level entry     Home Layout: Two level Home Equipment: [name] - 2 wheels      Prior Function Level of Independence: Independent         Comments: stopped using RW in the past 2 weeks. Reports numerous falls due to boyfriend being hoarder and having tight spaces to get through     Hand Dominance        Extremity/Trunk Assessment   Upper Extremity Assessment Upper Extremity Assessment: Overall WFL for tasks assessed    Lower Extremity Assessment Lower Extremity Assessment: Generalized weakness    Cervical / Trunk Assessment Cervical / Trunk Assessment: Normal  Communication   Communication: No difficulties  Cognition Arousal/Alertness: Awake/alert Behavior During Therapy: WFL for tasks  assessed/performed Overall Cognitive Status: Within Functional Limits for tasks assessed                                         General Comments      Exercises     Assessment/Plan    PT Assessment Patient needs continued PT services  PT Problem List Decreased strength;Decreased activity tolerance;Decreased balance       PT Treatment Interventions DME instruction;Gait training;Stair training;Functional mobility training;Therapeutic exercise;Therapeutic activities;Balance training;Patient/family education    PT Goals (Current goals can be found in the Care Plan section)  Acute Rehab PT Goals Patient Stated Goal: to go home PT Goal Formulation: With patient Time For Goal Achievement: 04/01/21 Potential to Achieve Goals: Good    Frequency Min 3X/week   Barriers to discharge        Co-evaluation               AM-PAC PT "6 Clicks" Mobility  Outcome Measure Help needed turning from your back to your side while in a flat bed without using bedrails?: None Help needed moving from lying on your back to sitting on the side of a flat bed without using bedrails?: None Help needed moving to and from a bed to a chair (including a wheelchair)?: A Little Help needed standing up from a chair using your arms (e.g., wheelchair or bedside chair)?: A Little Help needed to walk in hospital room?: A Little Help needed climbing 3-5 steps with a railing? : A Little 6 Click Score: 20    End of Session Equipment Utilized During Treatment: Gait belt Activity Tolerance: Patient tolerated treatment well Patient left: in bed;with call bell/phone within reach;with bed alarm set Nurse Communication: Mobility status PT Visit Diagnosis: Unsteadiness on feet (R26.81);Muscle weakness (generalized) (M62.81)    Time: [id number] PT Time Calculation (min) (ACUTE ONLY): 14 min   Charges:   PT Evaluation $PT Eval Low Complexity: 1 Low          [name] A. Gilford Rile PT, DPT Acute Rehabilitation Services Pager [phone] Office [phone]   Linna Hoff 03/18/2021, 5:24 PM

## 2021-03-18 NOTE — Progress Notes (Signed)
PT Cancellation Note  Patient Details Name: Judy Meyer MRN: [id number] DOB: Jan 06, 1961   Cancelled Treatment:    Reason Eval/Treat Not Completed: Medical issues which prohibited therapy Potassium level 2.6 with initiation of IV potassium chloride beginning at 9. Will check back after initiation of treatment.   Kairo Laubacher A. Gilford Rile PT, DPT Acute Rehabilitation Services Pager [phone] Office [phone]   Linna Hoff 03/18/2021, 7:54 AM

## 2021-03-18 NOTE — Progress Notes (Signed)
   03/18/21 0013  Provider Notification  Provider Name/Title Dr Sidney Ace  Date Provider Notified 03/18/21  Time Provider Notified 0002  Notification Type Page  Notification Reason Other (Comment) (Pt vomitted her second dose of Potassium)  Provider response See new orders  Date of Provider Response 03/18/21  Time of Provider Response 0010

## 2021-03-18 NOTE — Progress Notes (Signed)
PROGRESS NOTE    Judy Meyer  [id number] DOB: 11-26-1960 DOA: 03/16/2021 PCP: Pcp, No    Chief Complaint  Patient presents with   Seizures    Brief Narrative:  Judy Meyer is a 60 y.o. female with medical history significant of HTN, HLD, past alcohol use, alcohol associated hepatitis followed by [facility] hepatology, hypothyroidism and migraines who presented with seizure , hepatic encephalopathy.    Assessment & Plan:   Principal Problem:   Seizure ([place]) Active Problems:   Hypokalemia   Hypothyroidism   Hyperlipidemia   HTN (hypertension)   Acidosis, metabolic   Macrocytic anemia   Acute metabolic encephalopathy   Leukocytosis   AKI (acute kidney injury) ([place])   Hyperammonemia (HCC)   Acute hepatic encephalopathy of unclear etiology.  Improving with lactulose.  Ammonia level is 67.    Severe hypokalemia:  Replace as needed.  Recheck levels tonight.    Seizures Started on IV Keppra 500 mg twice daily Neurology on board.  She is being empirically treated with acyclovir until HSV PCR comes back negative.     Hypothyroidism:  Resume synthroid.    Hypertension;  BP parameters are optimal.    DVT prophylaxis: (Lovenox) Code Status: (Full Code) Family Communication: none at bedside Disposition:   Status is: Inpatient  Remains inpatient appropriate because:Ongoing diagnostic testing needed not appropriate for outpatient work up, Unsafe d/c plan, and IV treatments appropriate due to intensity of illness or inability to take PO  Dispo: The patient is from: Home              Anticipated d/c is to: Home              Patient currently is not medically stable to d/c.   Difficult to place patient No       Consultants:  Neurology  Procedures:   Antimicrobials: none    Subjective: Reports feeling weak, had 2 episodes of vomiting today.   Objective: Vitals:   03/18/21 0400 03/18/21 0754 03/18/21 1132 03/18/21 1548  BP: (!) 158/83 (!)  153/74 (!) 154/80 (!) 154/88  Pulse: (!) 105 (!) 110 (!) 104 (!) 102  Resp:  '18 18 18  '$ Temp:  98.3 F (36.8 C) 98.2 F (36.8 C) 98.2 F (36.8 C)  TempSrc:      SpO2: 100% 99% 99% 100%  Weight:      Height:        Intake/Output Summary (Last 24 hours) at 03/18/2021 1711 Last data filed at 03/18/2021 0557 Gross per 24 hour  Intake 1260.88 ml  Output --  Net 1260.88 ml   Filed Weights   03/16/21 1522  Weight: 77.4 kg    Examination:  General exam: Appears calm and comfortable  Respiratory system: Clear to auscultation. Respiratory effort normal. Cardiovascular system: S1 & S2 heard, RRR. No JVD,  No pedal edema. Gastrointestinal system: Abdomen is nondistended, soft and nontender. Normal bowel sounds heard. Central nervous system: Alert and oriented. No focal neurological deficits. Extremities: Symmetric 5 x 5 power. Skin: No rashes, lesions or ulcers Psychiatry: Mood & affect appropriate.     Data Reviewed: I have personally reviewed following labs and imaging studies  CBC: Recent Labs  Lab 03/16/21 1007 03/16/21 1022 03/17/21 0428 03/18/21 0301  WBC  --  19.3* 10.1 10.8*  NEUTROABS  --  6.1  --   --   HGB 10.9* 10.7* 10.1* 10.7*  HCT 32.0* 36.1 34.5* 33.4*  MCV  --  91.6 92.0 86.1  PLT  --  564* 433* 479*    Basic Metabolic Panel: Recent Labs  Lab 03/16/21 1022 03/16/21 1601 03/17/21 0428 03/17/21 1810 03/17/21 2115 03/18/21 0301  NA 138 135 136 139  --  137  K 2.3* 5.5* 2.1* 2.6*  --  2.6*  CL 105 106 104 105  --  108  CO2 11* 16* 16* 16*  --  14*  GLUCOSE 173* 111* 95 125*  --  104*  BUN '8 8 6 7  '$ --  6  CREATININE 1.36* 0.94 1.00 1.02*  --  1.01*  CALCIUM 9.3 8.5* 9.0 9.7  --  9.7  MG 2.1  --   --  2.2 2.1  --     GFR: Estimated Creatinine Clearance: 63.7 mL/min (A) (by C-G formula based on SCr of 1.01 mg/dL (H)).  Liver Function Tests: Recent Labs  Lab 03/16/21 1022 03/17/21 0428  AST 41 31  ALT 19 16  ALKPHOS 83 76  BILITOT 0.8  0.8  PROT 7.9 7.7  ALBUMIN 3.6 3.7    CBG: Recent Labs  Lab 03/16/21 0944  GLUCAP 177*     Recent Results (from the past 240 hour(s))  Resp Panel by RT-PCR (Flu A&B, Covid) Nasopharyngeal Swab     Status: None   Collection Time: 03/16/21 11:43 AM   Specimen: Nasopharyngeal Swab; Nasopharyngeal(NP) swabs in vial transport medium  Result Value Ref Range Status   SARS Coronavirus 2 by RT PCR NEGATIVE NEGATIVE Final    Comment: (NOTE) SARS-CoV-2 target nucleic acids are NOT DETECTED.  The SARS-CoV-2 RNA is generally detectable in upper respiratory specimens during the acute phase of infection. The lowest concentration of SARS-CoV-2 viral copies this assay can detect is 138 copies/mL. A negative result does not preclude SARS-Cov-2 infection and should not be used as the sole basis for treatment or other patient management decisions. A negative result may occur with  improper specimen collection/handling, submission of specimen other than nasopharyngeal swab, presence of viral mutation(s) within the areas targeted by this assay, and inadequate number of viral copies(<138 copies/mL). A negative result must be combined with clinical observations, patient history, and epidemiological information. The expected result is Negative.  Fact Sheet for Patients:  EntrepreneurPulse.com.au  Fact Sheet for Healthcare Providers:  IncredibleEmployment.be  This test is no t yet approved or cleared by the [place] FDA and  has been authorized for detection and/or diagnosis of SARS-CoV-2 by FDA under an Emergency Use Authorization (EUA). This EUA will remain  in effect (meaning this test can be used) for the duration of the COVID-19 declaration under Section 564(b)(1) of the Act, 21 U.S.C.section 360bbb-3(b)(1), unless the authorization is terminated  or revoked sooner.       Influenza A by PCR NEGATIVE NEGATIVE Final   Influenza B by PCR NEGATIVE  NEGATIVE Final    Comment: (NOTE) The Xpert Xpress SARS-CoV-2/FLU/RSV plus assay is intended as an aid in the diagnosis of influenza from Nasopharyngeal swab specimens and should not be used as a sole basis for treatment. Nasal washings and aspirates are unacceptable for Xpert Xpress SARS-CoV-2/FLU/RSV testing.  Fact Sheet for Patients: EntrepreneurPulse.com.au  Fact Sheet for Healthcare Providers: IncredibleEmployment.be  This test is not yet approved or cleared by the [place] FDA and has been authorized for detection and/or diagnosis of SARS-CoV-2 by FDA under an Emergency Use Authorization (EUA). This EUA will remain in effect (meaning this test can be used) for the duration of the COVID-19 declaration under Section 564(b)(1)  of the Act, 21 U.S.C. section 360bbb-3(b)(1), unless the authorization is terminated or revoked.  Performed at [facility], [place] [address], [place], [place] [address]   CSF culture w Gram Stain     Status: None (Preliminary result)   Collection Time: 03/16/21  4:39 PM   Specimen: CSF; Cerebrospinal Fluid  Result Value Ref Range Status   Specimen Description CSF  Final   Special Requests NONE  Final   Gram Stain   Final    WBC PRESENT, PREDOMINANTLY MONONUCLEAR NO ORGANISMS SEEN CYTOSPIN SMEAR    Culture   Final    NO GROWTH 2 DAYS Performed at [facility], [place] [address], [place], [place] [address]    Report Status PENDING  Incomplete         Radiology Studies: Overnight EEG with video  Result Date: 03/17/2021 Lora Havens, MD     03/17/2021 11:48 AM Patient Name: CELES MOSSEY MRN: [id number] Epilepsy Attending: Lora Havens Referring Physician/Provider: Dr Amie Portland Duration: 03/16/2021 1506 to 03/17/2021 1036  Patient history: 60 year old woman with history of seizures not on antiepileptics who presented with seizure-like activity.  EEG to evaluate for seizures.  Level of  alertness: Awake, asleep  AEDs during EEG study: Keppra  Technical aspects: This EEG study was done with scalp electrodes positioned according to the 10-20 International system of electrode placement. Electrical activity was acquired at a sampling rate of '500Hz'$  and reviewed with a high frequency filter of '70Hz'$  and a low frequency filter of '1Hz'$ . EEG data were recorded continuously and digitally stored.  Description: The posterior dominant rhythm consists of 8 Hz activity of moderate voltage (25-35 uV) seen predominantly in posterior head regions, symmetric and reactive to eye opening and eye closing.  Sleep was characterized by sleep spindles (12 to 14 Hz), maximum frontocentral region. Lateralized periodic discharges were noted arising from right hemisphere, which at times appeared rhythmic with fluctuating frequency between 1 to 2 Hz without definite evolution.  EEG also showed continuous 3 to 5 Hz theta-delta slowing in right hemisphere.  Hyperventilation and photic stimulation were not performed.    ABNORMALITY - Lateralized periodic discharges ( LPD+R) right hemisphere -Continuous slow, right hemisphere  IMPRESSION: This study showed evidence of epileptogenicity and cortical dysfunction arising from right hemisphere, likely due to underlying structural abnormality.  The lateralized periodic discharges were rhythmic at times with fluctuating frequency between 1 to 2 Hz without definite evolution.  This EEG pattern is on the ictal-interictal continuum with high potential for seizure recurrence.   Priyanka [name]        Scheduled Meds:  amLODipine  10 mg Oral Daily   enoxaparin (LOVENOX) injection  40 mg Subcutaneous Q24H   lactulose  30 g Oral BID   levothyroxine  75 mcg Oral Q0600   norethindrone-ethinyl estradiol  1 tablet Oral Daily   pantoprazole  40 mg Oral Daily   potassium chloride  20 mEq Oral Q4H   senna-docusate  1 tablet Oral BID   venlafaxine XR  75 mg Oral Daily   Continuous  Infusions:  sodium chloride Stopped (03/17/21 1700)   acyclovir 775 mg (03/18/21 1153)   levETIRAcetam 500 mg (03/18/21 0954)   potassium chloride       LOS: 2 days        Hosie Poisson, MD [facility]   To contact the attending provider between 7A-7P or the covering provider during after hours 7P-7A, please log into the web  site [email or web address] and access using universal [place] password for that web site. If you do not have the password, please call the hospital operator.  03/18/2021, 5:11 PM

## 2021-03-18 NOTE — Progress Notes (Signed)
Neurology Progress Note   S:// Seen and examined Hypokalemic on labs this morning Continues to have some vomiting spells.   O:// Current vital signs: BP (!) 153/74 (BP Location: Left Arm)   Pulse (!) 110   Temp 98.3 F (36.8 C)   Resp 18   Ht '5\' 6"'$  (1.676 m)   Wt 77.4 kg   LMP 06/11/2018   SpO2 99%   BMI 27.54 kg/m  Vital signs in last 24 hours: Temp:  [97.9 F (36.6 C)-98.3 F (36.8 C)] 98.3 F (36.8 C) (08/10 0754) Pulse Rate:  [95-112] 110 (08/10 0754) Resp:  [18-25] 18 (08/10 0754) BP: (137-167)/(68-87) 153/74 (08/10 0754) SpO2:  [86 %-100 %] 99 % (08/10 0754) General: Awake, alert, somewhat slow to respond to questions HEENT: Normocephalic/atraumatic CVS: Regular rhythm Respiratory: Breathing well saturating normally on room Abdomen nondistended nontender Neurological exam She is awake, appears alert, much more quick with answering questions today. She is able to tell me her name, correct age, correct month. She is able to repeat sentences without problems. She is able to follow simple commands Cranial nerves: Pupils equal round react light, extraocular movements intact, visual fields appear full, face appears symmetric, tongue and palate midline Motor examination with bilateral upper extremities antigravity without drift.  Bilateral lower extremities are equally weak with mild drift with no focality. Sensation intact to touch without extinction Coordination: No obvious dysmetria Slightly improved exam from yesterday  Medications  Current Facility-Administered Medications:    0.9 %  sodium chloride infusion, , Intravenous, Continuous, Orma Flaming, MD, Stopped at 03/17/21 1700   acetaminophen (TYLENOL) tablet 650 mg, 650 mg, Oral, Q6H PRN, 650 mg at 03/17/21 1628 **OR** acetaminophen (TYLENOL) suppository 650 mg, 650 mg, Rectal, Q6H PRN, Orma Flaming, MD   acyclovir (ZOVIRAX) 775 mg in dextrose 5 % 150 mL IVPB, 10 mg/kg, Intravenous, Q8H, Heloise Purpura, RPH, Last Rate: 165.5 mL/hr at 03/18/21 0439, 775 mg at 03/18/21 0439   amLODipine (NORVASC) tablet 10 mg, 10 mg, Oral, Daily, Orma Flaming, MD, 10 mg at 03/17/21 0939   enoxaparin (LOVENOX) injection 40 mg, 40 mg, Subcutaneous, Q24H, Orma Flaming, MD, 40 mg at 03/17/21 1835   HYDROcodone-acetaminophen (NORCO/VICODIN) 5-325 MG per tablet 1 tablet, 1 tablet, Oral, Q4H PRN, Orma Flaming, MD, 1 tablet at 03/17/21 2134   lactated ringers infusion, , Intravenous, Continuous, Florencia Reasons, MD, Last Rate: 75 mL/hr at 03/18/21 0557, Infusion Verify at 03/18/21 0557   lactulose (CHRONULAC) 10 GM/15ML solution 30 g, 30 g, Oral, BID, Brahmbhatt, Parag, MD, 30 g at 03/17/21 2135   levETIRAcetam (KEPPRA) IVPB 500 mg/100 mL premix, 500 mg, Intravenous, Q12H, Amie Portland, MD, Last Rate: 400 mL/hr at 03/17/21 2236, 500 mg at 03/17/21 2236   levothyroxine (SYNTHROID) tablet 75 mcg, 75 mcg, Oral, Q0600, Orma Flaming, MD, 75 mcg at 03/18/21 0513   morphine 2 MG/ML injection 2 mg, 2 mg, Intravenous, Q4H PRN, Mansy, Jan A, MD, 2 mg at 03/18/21 [id number]   norethindrone-ethinyl estradiol (CYCLAFEM) tablet 1 tablet, 1 tablet, Oral, Daily, Orma Flaming, MD   ondansetron [facility]) injection 4 mg, 4 mg, Intravenous, Q4H PRN, Mansy, Jan A, MD, 4 mg at 03/18/21 0602   pantoprazole (PROTONIX) EC tablet 40 mg, 40 mg, Oral, Daily, Florencia Reasons, MD, 40 mg at 03/17/21 1628   potassium chloride 10 mEq in 100 mL IVPB, 10 mEq, Intravenous, Q1 Hr x 6, Akula, Vijaya, MD   potassium chloride SA (KLOR-CON) CR tablet 20 mEq, 20 mEq, Oral, Q4H,  Hosie Poisson, MD   senna-docusate (Senokot-S) tablet 1 tablet, 1 tablet, Oral, BID, Florencia Reasons, MD, 1 tablet at 03/17/21 2229   venlafaxine XR (EFFEXOR-XR) 24 hr capsule 75 mg, 75 mg, Oral, Daily, Orma Flaming, MD Labs CBC    Component Value Date/Time   WBC 10.8 (H) 03/18/2021 0301   RBC 3.88 03/18/2021 0301   HGB 10.7 (L) 03/18/2021 0301   HCT 33.4 (L) 03/18/2021 0301   PLT 479 (H)  03/18/2021 0301   MCV 86.1 03/18/2021 0301   MCH 27.6 03/18/2021 0301   MCHC 32.0 03/18/2021 0301   RDW 15.9 (H) 03/18/2021 0301   LYMPHSABS 12.1 (H) 03/16/2021 1022   MONOABS 0.9 03/16/2021 1022   EOSABS 0.0 03/16/2021 1022   BASOSABS 0.0 03/16/2021 1022    CMP     Component Value Date/Time   NA 137 03/18/2021 0301   K 2.6 (LL) 03/18/2021 0301   CL 108 03/18/2021 0301   CO2 14 (L) 03/18/2021 0301   GLUCOSE 104 (H) 03/18/2021 0301   BUN 6 03/18/2021 0301   CREATININE 1.01 (H) 03/18/2021 0301   CALCIUM 9.7 03/18/2021 0301   PROT 7.7 03/17/2021 0428   ALBUMIN 3.7 03/17/2021 0428   AST 31 03/17/2021 0428   ALT 16 03/17/2021 0428   ALKPHOS 76 03/17/2021 0428   BILITOT 0.8 03/17/2021 0428   GFRNONAA >60 03/18/2021 0301   GFRAA >60 04/07/2020 0601    Imaging I have reviewed images in epic and the results pertinent to this consultation are: No new imaging to review  Assessment: 60 year old woman brought in for evaluation of seizure activity in her bed with left arm and leg stiffening followed by whole body shaking.   Remained unresponsive for a prolonged duration of time.  Imaging revealed right temporal DWI/flair signal abnormality concerning for ongoing status epilepticus versus HSV encephalitis. Loaded with Keppra, started on Keppra 500 twice daily. Spinal tap performed-normal protein, 1 cell.  Less likely to be HSV encephalitis and MRI finding more likely to be sequela of ongoing seizure activity/status epilepticus. She continued to have lateralized periodic discharges from the right hemisphere but does not seem to be actively seizing. Exam improving Also had ammonia elevated at 110 down to 91 - none today Unclear if the seizure is provoked-has a history of seizure in the remote past has not had a seizure in 15 years.  EEG findings are likely secondary to the hepatic encephalopathy and hyperammonemia and more likely reflect interictal/metabolic derangement rather than true  ictal pattern.  Also given that with some improvement in her ammonia her mentation is improved, is reassuring.  Low suspicion for HSV encephalitis-continue acyclovir till HSV PCR comes negative  Unclear if the HSV PCR has been sent-asked the nurse to contact the lab to expedite  Impression: Breakthrough seizure and status epilepticus in the setting of worsening hepatic encephalopathy-resolved Hepatic encephalopathy-improving Low suspicion for HSV encephalitis Hypokalemia Hyperammonemia   Recommendations: C/W Keppra 500 BID Continue clinical exams with frequent neurochecks Management of hyperammonemia per primary team-recheck ammonia this morning Management of hypokalemia per primary team Management of her general medical comorbidities including vomiting per primary team as you are Continue acyclovir till the HSV PCR comes back negative.  Nurse has been instructed to contact the lab for making sure that the sample is in the lab-chart does not reflect that. I will follow with you again tomorrow.  -- Amie Portland, MD Neurologist [facility] Pager: [phone]

## 2021-03-18 NOTE — Plan of Care (Signed)
  Problem: Health Behavior/Discharge Planning: Goal: Ability to manage health-related needs will improve Outcome: Progressing   Problem: Clinical Measurements: Goal: Ability to maintain clinical measurements within normal limits will improve Outcome: Progressing Goal: Will remain free from infection Outcome: Progressing Goal: Diagnostic test results will improve Outcome: Progressing Goal: Respiratory complications will improve Outcome: Progressing   Problem: Nutrition: Goal: Adequate nutrition will be maintained Outcome: Progressing   Problem: Elimination: Goal: Will not experience complications related to bowel motility Outcome: Progressing Goal: Will not experience complications related to urinary retention Outcome: Progressing   Problem: Pain Managment: Goal: General experience of comfort will improve Outcome: Progressing   Problem: Safety: Goal: Ability to remain free from injury will improve Outcome: Progressing   Problem: Skin Integrity: Goal: Risk for impaired skin integrity will decrease Outcome: Progressing   Problem: Coping: Goal: Ability to adjust to condition or change in health will improve Outcome: Progressing   Problem: Health Behavior/Discharge Planning: Goal: Compliance with prescribed medication regimen will improve Outcome: Progressing   Problem: Medication: Goal: Risk for medication side effects will decrease Outcome: Progressing   Problem: Clinical Measurements: Goal: Diagnostic test results will improve Outcome: Progressing   Problem: Safety: Goal: Verbalization of understanding the information provided will improve Outcome: Progressing   Problem: Self-Concept: Goal: Level of anxiety will decrease Outcome: Progressing

## 2021-03-19 LAB — BASIC METABOLIC PANEL
Anion gap: 12 (ref 5–15)
BUN: <5 mg/dL — ABNORMAL LOW (ref 6–20)
CO2: 14 mmol/L — ABNORMAL LOW (ref 22–32)
Calcium: 9.4 mg/dL (ref 8.9–10.3)
Chloride: 111 mmol/L (ref 98–111)
Creatinine, Ser: 1.02 mg/dL — ABNORMAL HIGH (ref 0.44–1.00)
GFR, Estimated: >60 mL/min (ref >60)
Glucose, Bld: 95 mg/dL (ref 70–99)
Potassium: 2.9 mmol/L — ABNORMAL LOW (ref 3.5–5.1)
Sodium: 137 mmol/L (ref 135–145)

## 2021-03-19 LAB — POTASSIUM: Potassium: 2.4 mmol/L — CL (ref 3.5–5.1)

## 2021-03-19 LAB — MAGNESIUM: Magnesium: 1.7 mg/dL (ref 1.7–2.4)

## 2021-03-19 LAB — PHOSPHORUS: Phosphorus: 2.1 mg/dL — ABNORMAL LOW (ref 2.5–4.6)

## 2021-03-19 MED ORDER — POTASSIUM CHLORIDE CRYS ER 20 MEQ PO TBCR
40.0000 meq | EXTENDED_RELEASE_TABLET | Freq: Three times a day (TID) | ORAL | Status: DC
  Administered 2021-03-19 – 2021-03-20 (×2): 40 meq via ORAL
  Filled 2021-03-19 (×2): qty 2

## 2021-03-19 MED ORDER — ACETAMINOPHEN 650 MG RE SUPP: 650.0000 mg | Freq: Three times a day (TID) | RECTAL | Status: DC | PRN

## 2021-03-19 MED ORDER — POTASSIUM & SODIUM PHOSPHATES 280-160-250 MG PO PACK
1.0000 | PACK | Freq: Three times a day (TID) | ORAL | Status: DC
  Administered 2021-03-19 – 2021-03-20 (×5): 1 via ORAL
  Filled 2021-03-19 (×7): qty 1

## 2021-03-19 MED ORDER — LEVETIRACETAM 500 MG PO TABS
500.0000 mg | ORAL_TABLET | Freq: Two times a day (BID) | ORAL | Status: DC
  Administered 2021-03-19 – 2021-03-20 (×2): 500 mg via ORAL
  Filled 2021-03-19 (×2): qty 1

## 2021-03-19 MED ORDER — POTASSIUM CHLORIDE CRYS ER 20 MEQ PO TBCR
40.0000 meq | EXTENDED_RELEASE_TABLET | Freq: Two times a day (BID) | ORAL | Status: DC
  Administered 2021-03-19 – 2021-03-20 (×2): 40 meq via ORAL
  Filled 2021-03-19 (×2): qty 2

## 2021-03-19 MED ORDER — SODIUM BICARBONATE 650 MG PO TABS
650.0000 mg | ORAL_TABLET | Freq: Two times a day (BID) | ORAL | Status: DC
  Administered 2021-03-19 – 2021-03-20 (×2): 650 mg via ORAL
  Filled 2021-03-19 (×2): qty 1

## 2021-03-19 MED ORDER — POTASSIUM CHLORIDE 10 MEQ/100ML IV SOLN
10.0000 meq | INTRAVENOUS | Status: AC
Start: 2021-03-19 — End: 2021-03-20
  Administered 2021-03-19 – 2021-03-20 (×6): 10 meq via INTRAVENOUS
  Filled 2021-03-19 (×6): qty 100

## 2021-03-19 MED ORDER — GALCANEZUMAB-GNLM 120 MG/ML ~~LOC~~ SOAJ: 120.0000 mg | SUBCUTANEOUS | Status: DC

## 2021-03-19 MED ORDER — ASPIRIN-ACETAMINOPHEN-CAFFEINE 250-250-65 MG PO TABS
1.0000 | ORAL_TABLET | Freq: Four times a day (QID) | ORAL | Status: DC | PRN
  Administered 2021-03-19 – 2021-03-20 (×2): 1 via ORAL
  Filled 2021-03-19 (×4): qty 1

## 2021-03-19 MED ORDER — METHOCARBAMOL 1000 MG/10ML IJ SOLN
500.0000 mg | Freq: Three times a day (TID) | INTRAVENOUS | Status: DC | PRN
  Filled 2021-03-19: qty 5

## 2021-03-19 MED ORDER — ACETAMINOPHEN 325 MG PO TABS: 650.0000 mg | ORAL_TABLET | Freq: Three times a day (TID) | ORAL | Status: DC | PRN

## 2021-03-19 MED ORDER — MAGNESIUM SULFATE 2 GM/50ML IV SOLN
2.0000 g | Freq: Once | INTRAVENOUS | Status: AC
  Administered 2021-03-19: 2 g via INTRAVENOUS
  Filled 2021-03-19: qty 50

## 2021-03-19 NOTE — TOC Initial Note (Signed)
Transition of Care [facility]) - Initial/Assessment Note    Patient Details  Name: Judy Meyer MRN: [id number] Date of Birth: 12/14/1960  Transition of [facility] CM/SW Contact:    Pollie Friar, RN Phone Number: 03/19/2021, 3:08 PM  Clinical Narrative:   PCP: Dr Debby Bud n Rondall Allegra               Pt's primary residence is in [place] but she has been in [place] with boyfriend while her health has been poor. She plans on staying in [place] for the next month.  She has a walker at home. Denies issues with medications or transportation. HH arranged through [facility].  Address in [place]: [place], [place] [address] TOC following.   Expected Discharge Plan: [place] Barriers to Discharge: Continued Medical Work up   Patient Goals and CMS Choice   CMS Medicare.gov Compare Post Acute Care list provided to:: Patient Choice offered to / list presented to : Patient  Expected Discharge Plan and Services Expected Discharge Plan: [place]   Discharge Planning Services: CM Consult Post Acute Care Choice: [place] arrangements for the past 2 months: Apartment                           HH Arranged: PT, RN HH Agency: [place] [facility] Contacted: 03/19/21   Representative spoke with at [place]: Tommi Rumps  Prior Living Arrangements/Services Living arrangements for the past 2 months: Apartment Lives with:: Significant Other Patient language and need for interpreter reviewed:: Yes Do you feel safe going back to the place where you live?: Yes          Current home services: DME (walker) Criminal Activity/Legal Involvement Pertinent to Current Situation/Hospitalization: No - Comment as needed  Activities of Daily Living      Permission Sought/Granted                  Emotional Assessment Appearance:: Appears stated age Attitude/Demeanor/Rapport: Engaged Affect (typically observed):  Accepting Orientation: : Oriented to Self, Oriented to Place, Oriented to Situation   Psych Involvement: No (comment)  Admission diagnosis:  Hypokalemia [E87.6] Seizure ([place]) [R56.9] Abnormal MRI [id number] Acute metabolic encephalopathy [id number] Patient Active Problem List   Diagnosis Date Noted   Acute metabolic encephalopathy 0000000   Seizure ([place]) 03/16/2021   Leukocytosis 03/16/2021   AKI (acute kidney injury) ([place]) 03/16/2021   Hyperammonemia ([place]) 03/16/2021   Elevated CK 11/15/2020   Bacterial conjunctivitis of both eyes 11/15/2020   Prolonged Q-T interval on ECG 11/14/2020   Intractable vomiting with nausea 11/14/2020   Jaundice    Transaminitis    Hypomagnesemia    Swelling of lower extremity    Folate deficiency    Rhabdomyolysis 07/20/2020   Malnutrition of moderate degree 07/19/2020   Acute lower UTI 07/18/2020   Dehydration 07/18/2020   Hyperbilirubinemia 07/18/2020   Acidosis, metabolic [id number]   Abnormal liver function 07/18/2020   Macrocytic anemia 07/18/2020   Iron excess 07/18/2020   Gastroenteritis 04/07/2020   Nephrolithiasis 04/07/2020   HTN (hypertension) 04/07/2020   Intractable nausea and vomiting 04/06/2020   Depression 04/06/2020   Hypokalemia 04/06/2020   Hypothyroidism 04/06/2020   Hyperlipidemia 04/06/2020   PCP:  Pcp, No Pharmacy:   [id number] [id number] [id number]:  [phone] Fax: [phone]     Social Determinants of Health (SDOH) Interventions    Readmission Risk Interventions No flowsheet data found.

## 2021-03-19 NOTE — Progress Notes (Signed)
Physical Therapy Treatment Patient Details Name: Judy Meyer MRN: [id number] DOB: [birth year] Today's Date: 03/19/2021    History of Present Illness 60 y/o female presented to ED on 8/8 following seizure x 2. CT head negative. MRI motion degraded but possibel T2/FLAIR signal abnormality within medial R temporal lobe/hippocampus and crus of fornix. EEG showed evidence of epileptogenicity arising from R hemisphere, maximal R frontotemporal region likely due to underlying structural abnormality with high potential for seizure recurrence. PMH: HTN, hypothyroidism, liver disease, uterine fibroid, active steatohepatitis, cholestasis, seizures.    PT Comments    Patient progressing towards physical therapy goals. Patient stated this session that she has been utilizing RW for several weeks, however at eval stated she stopped using RW recently. Patient requires supervision for mobility due to balance deficits especially with head turns and obstacle negotiation. Continue to recommend HHPT following discharge.     Follow Up Recommendations  Home health PT;Supervision for mobility/OOB     Equipment Recommendations  None recommended by PT    Recommendations for Other Services       Precautions / Restrictions Precautions Precautions: Fall Precaution Comments: seizure Restrictions Weight Bearing Restrictions: No    Mobility  Bed Mobility Overal bed mobility: Modified Independent                  Transfers Overall transfer level: Needs assistance Equipment used: Rolling Judy Meyer (2 wheeled) Transfers: Sit to/from Stand Sit to Stand: Supervision         General transfer comment: supervision for safety. Initially stood without AD. Patient reports that she has been using RW for weeks now but at eval, states she stopped using RW in the past 2 weeks  Ambulation/Gait Ambulation/Gait assistance: Supervision Gait Distance (Feet): 200 Feet Assistive device: Rolling Judy Meyer (2  wheeled) Gait Pattern/deviations: Step-through pattern;Decreased stride length;Ataxic;Drifts right/left;Wide base of support;Narrow base of support Gait velocity: decreased   General Gait Details: supervision for safety. Drifting L/R and increase in drifting with introduction of head turns. Required min guard for obstacle negotiation due to unsteadiness.   Stairs             Wheelchair Mobility    Modified Rankin (Stroke Patients Only)       Balance Overall balance assessment: Mild deficits observed, not formally tested                                          Cognition Arousal/Alertness: Awake/alert Behavior During Therapy: WFL for tasks assessed/performed Overall Cognitive Status: Within Functional Limits for tasks assessed                                        Exercises      General Comments        Pertinent Vitals/Pain Pain Assessment: Faces Faces Pain Scale: Hurts even more Pain Location: head Pain Descriptors / Indicators: Headache Pain Intervention(s): Monitored during session    Home Living                      Prior Function            PT Goals (current goals can now be found in the care plan section) Acute Rehab PT Goals Patient Stated Goal: to go home PT Goal Formulation: With patient Time For  Goal Achievement: 04/01/21 Potential to Achieve Goals: Good Progress towards PT goals: Progressing toward goals    Frequency    Min 3X/week      PT Plan Current plan remains appropriate    Co-evaluation              AM-PAC PT "6 Clicks" Mobility   Outcome Measure  Help needed turning from your back to your side while in a flat bed without using bedrails?: None Help needed moving from lying on your back to sitting on the side of a flat bed without using bedrails?: None Help needed moving to and from a bed to a chair (including a wheelchair)?: A Little Help needed standing up from a chair  using your arms (e.g., wheelchair or bedside chair)?: A Little Help needed to walk in hospital room?: A Little Help needed climbing 3-5 steps with a railing? : A Little 6 Click Score: 20    End of Session Equipment Utilized During Treatment: Gait belt Activity Tolerance: Patient tolerated treatment well Patient left: in bed;with call bell/phone within reach;with bed alarm set Nurse Communication: Mobility status PT Visit Diagnosis: Unsteadiness on feet (R26.81);Muscle weakness (generalized) (M62.81)     Time: [id number] PT Time Calculation (min) (ACUTE ONLY): 15 min  Charges:  $Gait Training: 8-22 mins                     Judy Meyer A. Gilford Rile PT, DPT Acute Rehabilitation Services Pager [phone] Office [phone]    Linna Hoff 03/19/2021, 3:34 PM

## 2021-03-19 NOTE — Progress Notes (Signed)
Neurology Progress Note   S:// Seen and examined Had vomiting spells GI consulted for high ammonia level/recommend outpatient follow-up.   O:// Current vital signs: BP (!) 153/84 (BP Location: Left Arm)   Pulse 88   Temp 98 F (36.7 C) (Oral)   Resp 18   Ht '5\' 6"'$  (1.676 m)   Wt 77.4 kg   LMP 06/11/2018   SpO2 100%   BMI 27.54 kg/m  Vital signs in last 24 hours: Temp:  [98 F (36.7 C)-98.9 F (37.2 C)] 98 F (36.7 C) (08/11 0807) Pulse Rate:  [88-104] 88 (08/11 0807) Resp:  [17-18] 18 (08/11 0807) BP: (134-154)/(64-88) 153/84 (08/11 0807) SpO2:  [99 %-100 %] 100 % (08/11 0807) General: Awake, alert, much more quick in her responses today HEENT: Normocephalic/atraumatic CVS: Regular rhythm Respiratory: Breathing well saturating normally on room air Abdomen nondistended nontender Neurological exam She is awake, appears alert, much more quick with answering questions today. She is able to tell me her name, correct age, correct month. She is able to repeat sentences without problems. She is able to follow simple commands Cranial nerves: Pupils equal round react light, extraocular movements intact, visual fields appear full, face appears symmetric, tongue and palate midline Motor examination with bilateral upper extremities antigravity without drift.  Bilateral lower extremities are equally weak with mild drift with no focality. Sensation intact to touch without extinction Coordination: No obvious dysmetria Exam continues to improve from the prior 2 days and according to the boyfriend she subjectively also looks very close to her baseline  Medications  Current Facility-Administered Medications:    acetaminophen (TYLENOL) tablet 650 mg, 650 mg, Oral, Q6H PRN, 650 mg at 03/17/21 1628 **OR** acetaminophen (TYLENOL) suppository 650 mg, 650 mg, Rectal, Q6H PRN, Orma Flaming, MD   amLODipine (NORVASC) tablet 10 mg, 10 mg, Oral, Daily, Orma Flaming, MD, 10 mg at 03/18/21  0946   enoxaparin (LOVENOX) injection 40 mg, 40 mg, Subcutaneous, Q24H, Orma Flaming, MD, 40 mg at 03/18/21 1728   lactulose ([place]) 10 GM/15ML solution 30 g, 30 g, Oral, BID, Brahmbhatt, Parag, MD, 30 g at 03/18/21 2148   levETIRAcetam (KEPPRA) IVPB 500 mg/100 mL premix, 500 mg, Intravenous, Q12H, Amie Portland, MD, Stopped at 03/19/21 0100   levothyroxine (SYNTHROID) tablet 75 mcg, 75 mcg, Oral, Q0600, Orma Flaming, MD, 75 mcg at 03/19/21 0604   morphine 2 MG/ML injection 2 mg, 2 mg, Intravenous, Q4H PRN, Mansy, Jan A, MD, 2 mg at 03/18/21 0359   ondansetron [facility]) injection 4 mg, 4 mg, Intravenous, Q4H PRN, Mansy, Jan A, MD, 4 mg at 03/19/21 0257   pantoprazole (PROTONIX) EC tablet 40 mg, 40 mg, Oral, Daily, Florencia Reasons, MD, 40 mg at 03/18/21 0946   senna-docusate (Senokot-S) tablet 1 tablet, 1 tablet, Oral, BID, Florencia Reasons, MD, 1 tablet at 03/18/21 0946   venlafaxine XR (EFFEXOR-XR) 24 hr capsule 75 mg, 75 mg, Oral, Daily, Orma Flaming, MD, 75 mg at 03/18/21 0946 Labs CBC    Component Value Date/Time   WBC 10.8 (H) 03/18/2021 0301   RBC 3.88 03/18/2021 0301   HGB 10.7 (L) 03/18/2021 0301   HCT 33.4 (L) 03/18/2021 0301   PLT 479 (H) 03/18/2021 0301   MCV 86.1 03/18/2021 0301   MCH 27.6 03/18/2021 0301   MCHC 32.0 03/18/2021 0301   RDW 15.9 (H) 03/18/2021 0301   LYMPHSABS 12.1 (H) 03/16/2021 1022   MONOABS 0.9 03/16/2021 1022   EOSABS 0.0 03/16/2021 1022   BASOSABS 0.0 03/16/2021 1022  CMP     Component Value Date/Time   NA 137 03/19/2021 0143   K 2.9 (L) 03/19/2021 0143   CL 111 03/19/2021 0143   CO2 14 (L) 03/19/2021 0143   GLUCOSE 95 03/19/2021 0143   BUN <5 (L) 03/19/2021 0143   CREATININE 1.02 (H) 03/19/2021 0143   CALCIUM 9.4 03/19/2021 0143   PROT 7.7 03/17/2021 0428   ALBUMIN 3.7 03/17/2021 0428   AST 31 03/17/2021 0428   ALT 16 03/17/2021 0428   ALKPHOS 76 03/17/2021 0428   BILITOT 0.8 03/17/2021 0428   GFRNONAA >60 03/19/2021 0143   GFRAA >60  04/07/2020 0601  HSV PCR on the CSF is negative.  Ammonia down to 67  Imaging I have reviewed images in epic and the results pertinent to this consultation are: No new imaging to review  Assessment: 60 year old woman brought in for evaluation of seizure activity in her bed with left arm and leg stiffening followed by whole body shaking.   Remained unresponsive for a prolonged duration of time.  Imaging revealed right temporal DWI/flair signal abnormality concerning for ongoing status epilepticus versus HSV encephalitis. Loaded with Keppra, started on Keppra 500 twice daily. Spinal tap performed-normal protein, 1 cell.  HSV PCR negative and MRI finding more likely to be sequela of ongoing seizure activity/status epilepticus at the time of image acquisition. She continued to have lateralized periodic discharges from the right hemisphere but does not seem to be actively seizing. Exam improving-LTM discontinued.  Also had ammonia elevated at 110 down to 91 -down to 67 yesterday.  Unclear if the seizure is provoked-has a history of seizure in the remote past has not had a seizure in 15 years.  EEG findings are likely secondary to the hepatic encephalopathy and hyperammonemia and more likely reflect interictal/metabolic derangement rather than true ictal pattern.  Also given that with some improvement in her ammonia her mentation is improved, is reassuring.  HSV PCR on the CSF negative   Impression: Breakthrough seizure and status epilepticus in the setting of worsening hepatic encephalopathy-seizure resolved resolved Hepatic encephalopathy-improving-GI recommends outpatient follow-up HSV encephalitis ruled out Hypokalemia Hyperammonemia   Recommendations: C/W Keppra 500 BID Continue clinical exams with frequent neurochecks Management of hyperammonemia per primary team-recheck ammonia this morning Management of hypokalemia per primary team Management of her general medical  comorbidities including vomiting per primary team as you are Discontinue acyclovir Inpatient neurology will be available as needed. Detailed seizure precautions as below should be given to the patient upon discharge Outpatient neurology follow-up in 8 to 12 weeks Plan discussed with Dr. Inda Merlin PRECAUTIONS Per [facility] statutes, patients with seizures are not allowed to drive until they have been seizure-free for six months.   Use caution when using heavy equipment or power tools. Avoid working on ladders or at heights. Take showers instead of baths. Ensure the water temperature is not too high on the home water heater. Do not go swimming alone. Do not lock yourself in a room alone (i.e. bathroom). When caring for infants or small children, sit down when holding, feeding, or changing them to minimize risk of injury to the child in the event you have a seizure. Maintain good sleep hygiene. Avoid alcohol.    If patient has another seizure, call 911 and bring them back to the ED if: A.  The seizure lasts longer than 5 minutes.      B.  The patient doesn't wake shortly after the seizure or has new problems  such as difficulty seeing, speaking or moving following the seizure C.  The patient was injured during the seizure D.  The patient has a temperature over 102 F (39C) E.  The patient vomited during the seizure and now is having trouble breathing   -- Amie Portland, MD Neurologist [facility] Pager: [phone]

## 2021-03-19 NOTE — Progress Notes (Signed)
PROGRESS NOTE    Judy Meyer  [id number] DOB: 08/25/1960 DOA: 03/16/2021 PCP: Pcp, No    Chief Complaint  Patient presents with   Seizures    Brief Narrative:  Judy Meyer is a 60 y.o. 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  She appears to be back to baseline today. She is alert and oriented to place,person and time. She is answering all questions appropriately and following commands. . She reports she has muscle spasms in the back. She worked with PT yesterday.  No vomiting so far. She reports occasional nausea.    Assessment & Plan:   Principal Problem:   Seizure ([place]) Active Problems:   Hypokalemia   Hypothyroidism   Hyperlipidemia   HTN (hypertension)   Acidosis, metabolic   Macrocytic anemia   Acute metabolic encephalopathy   Leukocytosis   AKI (acute kidney injury) ([place])   Hyperammonemia ([facility])   Acute hepatic encephalopathy of unclear etiology.  Improving with lactulose.  Recheck ammonia level in am.  Since she is alert and oriented, will stop the lactulose as her potassium levels have been persistently low.  She reports no vomiting today.     Severe hypokalemia:  Replace as needed.  Recheck levels tonight.    Hypomagnesemia and hypophosphatemia:  Replaced.  Recheck in am.    Seizures Started on IV Keppra 500 mg twice daily,  Neurology on board, appreciate recommendations.    Metabolic acidosis:  - started her on sodium bicarb tablets. Recheck levels in am.   Hypothyroidism:  Resume synthroid.    Hypertension;  BP parameters are optimal.   Leukocytosis Mild probably reactive.    Anemia of chronic disease  Normocytic.  Hemoglobin is stable around 10.    DVT prophylaxis: (Lovenox) Code Status: (Full Code) Family Communication: none at  bedside Disposition:   Status is: Inpatient  Remains inpatient appropriate because:Ongoing diagnostic testing needed not appropriate for outpatient work up, Unsafe d/c plan, and IV treatments appropriate due to intensity of illness or inability to take PO  Dispo: The patient is from: Home              Anticipated d/c is to: Home              Patient currently is not medically stable to d/c.   Difficult to place patient No       Consultants:  Neurology  Procedures: none.   Antimicrobials: none    Subjective: Slightly nauseated, no vomiting. No abd pain.   Objective: Vitals:   03/19/21 0352 03/19/21 0807 03/19/21 1219 03/19/21 1538  BP: (!) 141/66 (!) 153/84 (!) 146/80 (!) 122/59  Pulse: 91 88 93 88  Resp: '17 18 17 17  '$ Temp: 98.1 F (36.7 C) 98 F (36.7 C) 98.5 F (36.9 C) 98.9 F (37.2 C)  TempSrc:  Oral Oral Oral  SpO2: 100% 100% 100% 99%  Weight:      Height:        Intake/Output Summary (Last 24 hours) at 03/19/2021 1726 Last data filed at 03/19/2021 0336 Gross per 24 hour  Intake 1824.97 ml  Output 250 ml  Net 1574.97 ml    Filed Weights   03/16/21 1522  Weight: 77.4 kg    Examination:  General exam: Appears calm and comfortable.  Respiratory system: Air entry fair, no wheezing heard.  Cardiovascular system: RRR NO JVD,  no pedal edema.  Gastrointestinal system: Abdomen is soft, NT ND BS+ Central nervous system: Alert and oriented, following commands.  Extremities: no pedal edema.  Skin: No rashes seen.  Psychiatry: Mood is appropriate.     Data Reviewed: I have personally reviewed following labs and imaging studies  CBC: Recent Labs  Lab 03/16/21 1007 03/16/21 1022 03/17/21 0428 03/18/21 0301  WBC  --  19.3* 10.1 10.8*  NEUTROABS  --  6.1  --   --   HGB 10.9* 10.7* 10.1* 10.7*  HCT 32.0* 36.1 34.5* 33.4*  MCV  --  91.6 92.0 86.1  PLT  --  564* 433* 479*     Basic Metabolic Panel: Recent Labs  Lab 03/16/21 1022 03/16/21 1601  03/17/21 0428 03/17/21 1810 03/17/21 2115 03/18/21 0301 03/18/21 1751 03/19/21 0143  NA 138 135 136 139  --  137  --  137  K 2.3* 5.5* 2.1* 2.6*  --  2.6* 2.7* 2.9*  CL 105 106 104 105  --  108  --  111  CO2 11* 16* 16* 16*  --  14*  --  14*  GLUCOSE 173* 111* 95 125*  --  104*  --  95  BUN '8 8 6 7  '$ --  6  --  <5*  CREATININE 1.36* 0.94 1.00 1.02*  --  1.01*  --  1.02*  CALCIUM 9.3 8.5* 9.0 9.7  --  9.7  --  9.4  MG 2.1  --   --  2.2 2.1  --   --  1.7  PHOS  --   --   --   --   --   --   --  2.1*     GFR: Estimated Creatinine Clearance: 63.1 mL/min (A) (by C-G formula based on SCr of 1.02 mg/dL (H)).  Liver Function Tests: Recent Labs  Lab 03/16/21 1022 03/17/21 0428  AST 41 31  ALT 19 16  ALKPHOS 83 76  BILITOT 0.8 0.8  PROT 7.9 7.7  ALBUMIN 3.6 3.7     CBG: Recent Labs  Lab 03/16/21 0944  GLUCAP 177*      Recent Results (from the past 240 hour(s))  Resp Panel by RT-PCR (Flu A&B, Covid) Nasopharyngeal Swab     Status: None   Collection Time: 03/16/21 11:43 AM   Specimen: Nasopharyngeal Swab; Nasopharyngeal(NP) swabs in vial transport medium  Result Value Ref Range Status   SARS Coronavirus 2 by RT PCR NEGATIVE NEGATIVE Final    Comment: (NOTE) SARS-CoV-2 target nucleic acids are NOT DETECTED.  The SARS-CoV-2 RNA is generally detectable in upper respiratory specimens during the acute phase of infection. The lowest concentration of SARS-CoV-2 viral copies this assay can detect is 138 copies/mL. A negative result does not preclude SARS-Cov-2 infection and should not be used as the sole basis for treatment or other patient management decisions. A negative result may occur with  improper specimen collection/handling, submission of specimen other than nasopharyngeal swab, presence of viral mutation(s) within the areas targeted by this assay, and inadequate number of viral copies(<138 copies/mL). A negative result must be combined with clinical observations,  patient history, and epidemiological information. The expected result is Negative.  Fact Sheet for Patients:  EntrepreneurPulse.com.au  Fact Sheet for Healthcare Providers:  IncredibleEmployment.be  This test is no t yet approved or cleared by the [place] FDA and  has been authorized for detection and/or diagnosis of SARS-CoV-2 by FDA under an Emergency Use Authorization (EUA). This EUA will  remain  in effect (meaning this test can be used) for the duration of the COVID-19 declaration under Section 564(b)(1) of the Act, 21 U.S.C.section 360bbb-3(b)(1), unless the authorization is terminated  or revoked sooner.       Influenza A by PCR NEGATIVE NEGATIVE Final   Influenza B by PCR NEGATIVE NEGATIVE Final    Comment: (NOTE) The Xpert Xpress SARS-CoV-2/FLU/RSV plus assay is intended as an aid in the diagnosis of influenza from Nasopharyngeal swab specimens and should not be used as a sole basis for treatment. Nasal washings and aspirates are unacceptable for Xpert Xpress SARS-CoV-2/FLU/RSV testing.  Fact Sheet for Patients: EntrepreneurPulse.com.au  Fact Sheet for Healthcare Providers: IncredibleEmployment.be  This test is not yet approved or cleared by the [place] FDA and has been authorized for detection and/or diagnosis of SARS-CoV-2 by FDA under an Emergency Use Authorization (EUA). This EUA will remain in effect (meaning this test can be used) for the duration of the COVID-19 declaration under Section 564(b)(1) of the Act, 21 U.S.C. section 360bbb-3(b)(1), unless the authorization is terminated or revoked.  Performed at [facility], [place] [address] [place], [place] [address]   CSF culture w Gram Stain     Status: None (Preliminary result)   Collection Time: 03/16/21  4:39 PM   Specimen: CSF; Cerebrospinal Fluid  Result Value Ref Range Status   Specimen Description CSF  Final    Special Requests NONE  Final   Gram Stain   Final    WBC PRESENT, PREDOMINANTLY MONONUCLEAR NO ORGANISMS SEEN CYTOSPIN SMEAR    Culture   Final    NO GROWTH 3 DAYS Performed at [facility], [place] [address] [place], [place] [address]    Report Status PENDING  Incomplete          Radiology Studies: No results found.      Scheduled Meds:  amLODipine  10 mg Oral Daily   enoxaparin (LOVENOX) injection  40 mg Subcutaneous Q24H   Galcanezumab-gnlm  120 mg Subcutaneous Q30 days   levETIRAcetam  500 mg Oral BID   levothyroxine  75 mcg Oral Q0600   pantoprazole  40 mg Oral Daily   potassium & sodium phosphates  1 packet Oral TID WC & HS   potassium chloride  40 mEq Oral BID   senna-docusate  1 tablet Oral BID   venlafaxine XR  75 mg Oral Daily   Continuous Infusions:     LOS: 3 days        Hosie Poisson, MD [facility]   To contact the attending provider between 7A-7P or the covering provider during after hours 7P-7A, please log into the web site [email or web address] and access using universal [place] password for that web site. If you do not have the password, please call the hospital operator.  03/19/2021, 5:26 PM

## 2021-03-19 NOTE — Plan of Care (Signed)
  Problem: Health Behavior/Discharge Planning: Goal: Ability to manage health-related needs will improve Outcome: Progressing   Problem: Clinical Measurements: Goal: Ability to maintain clinical measurements within normal limits will improve Outcome: Progressing Goal: Will remain free from infection Outcome: Progressing Goal: Diagnostic test results will improve Outcome: Progressing Goal: Respiratory complications will improve Outcome: Progressing   Problem: Nutrition: Goal: Adequate nutrition will be maintained Outcome: Progressing   Problem: Elimination: Goal: Will not experience complications related to bowel motility Outcome: Progressing Goal: Will not experience complications related to urinary retention Outcome: Progressing   Problem: Pain Managment: Goal: General experience of comfort will improve Outcome: Progressing   Problem: Safety: Goal: Ability to remain free from injury will improve Outcome: Progressing   Problem: Skin Integrity: Goal: Risk for impaired skin integrity will decrease Outcome: Progressing   Problem: Coping: Goal: Ability to adjust to condition or change in health will improve Outcome: Progressing   Problem: Health Behavior/Discharge Planning: Goal: Compliance with prescribed medication regimen will improve Outcome: Progressing   Problem: Medication: Goal: Risk for medication side effects will decrease Outcome: Progressing   Problem: Clinical Measurements: Goal: Diagnostic test results will improve Outcome: Progressing   Problem: Safety: Goal: Verbalization of understanding the information provided will improve Outcome: Progressing   Problem: Self-Concept: Goal: Level of anxiety will decrease Outcome: Progressing

## 2021-03-19 NOTE — Care Management Important Message (Signed)
Important Message  Patient Details  Name: Judy Meyer MRN: [id number] Date of Birth: Oct 20, 1960   Medicare Important Message Given:  Yes     [name] [name] 03/19/2021, 2:48 PM

## 2021-03-20 DIAGNOSIS — G9341 Metabolic encephalopathy: Secondary | ICD-10-CM

## 2021-03-20 LAB — BASIC METABOLIC PANEL
Anion gap: 10 (ref 5–15)
BUN: 5 mg/dL — ABNORMAL LOW (ref 6–20)
CO2: 14 mmol/L — ABNORMAL LOW (ref 22–32)
Calcium: 8.8 mg/dL — ABNORMAL LOW (ref 8.9–10.3)
Chloride: 111 mmol/L (ref 98–111)
Creatinine, Ser: 0.97 mg/dL (ref 0.44–1.00)
GFR, Estimated: >60 mL/min (ref >60)
Glucose, Bld: 89 mg/dL (ref 70–99)
Potassium: 3.4 mmol/L — ABNORMAL LOW (ref 3.5–5.1)
Sodium: 135 mmol/L (ref 135–145)

## 2021-03-20 LAB — CSF CULTURE W GRAM STAIN: Culture: NO GROWTH

## 2021-03-20 LAB — AMMONIA: Ammonia: 87 umol/L — ABNORMAL HIGH (ref 9–35)

## 2021-03-20 LAB — PHOSPHORUS: Phosphorus: 2.5 mg/dL (ref 2.5–4.6)

## 2021-03-20 LAB — MAGNESIUM: Magnesium: 2 mg/dL (ref 1.7–2.4)

## 2021-03-20 MED ORDER — LEVETIRACETAM 500 MG PO TABS: 500.0000 mg | ORAL_TABLET | Freq: Two times a day (BID) | ORAL | 3 refills | Status: AC

## 2021-03-20 MED ORDER — LACTULOSE 10 GM/15ML PO SOLN
20.0000 g | Freq: Two times a day (BID) | ORAL | Status: DC
  Administered 2021-03-20: 20 g via ORAL
  Filled 2021-03-20: qty 30

## 2021-03-20 MED ORDER — LEVOTHYROXINE SODIUM 75 MCG PO TABS: 75.0000 ug | ORAL_TABLET | Freq: Every day | ORAL | 3 refills | Status: AC

## 2021-03-20 MED ORDER — SODIUM BICARBONATE 650 MG PO TABS: 650.0000 mg | ORAL_TABLET | Freq: Two times a day (BID) | ORAL | 0 refills | Status: AC

## 2021-03-20 MED ORDER — LACTULOSE 10 GM/15ML PO SOLN: 20.0000 g | Freq: Two times a day (BID) | ORAL | 0 refills | Status: AC

## 2021-03-20 MED ORDER — POTASSIUM CHLORIDE CRYS ER 20 MEQ PO TBCR: 20.0000 meq | EXTENDED_RELEASE_TABLET | Freq: Every day | ORAL | 0 refills | Status: AC

## 2021-03-20 NOTE — Plan of Care (Signed)
More interactive with staff tonight. Low K+ level drawn at 1748 with new orders. Pt stated "i'm tired of doing this." I informed pt she wasn't taking Lactalose anymore so hopefully her K+ would stay up. Pt hasn't had a BM thus far this shift.

## 2021-03-20 NOTE — Plan of Care (Signed)
  Problem: Health Behavior/Discharge Planning: Goal: Ability to manage health-related needs will improve Outcome: Adequate for Discharge   Problem: Clinical Measurements: Goal: Ability to maintain clinical measurements within normal limits will improve Outcome: Adequate for Discharge Goal: Will remain free from infection Outcome: Adequate for Discharge Goal: Diagnostic test results will improve Outcome: Adequate for Discharge Goal: Respiratory complications will improve Outcome: Adequate for Discharge   Problem: Nutrition: Goal: Adequate nutrition will be maintained Outcome: Adequate for Discharge   Problem: Elimination: Goal: Will not experience complications related to bowel motility Outcome: Adequate for Discharge Goal: Will not experience complications related to urinary retention Outcome: Adequate for Discharge   Problem: Coping: Goal: Ability to adjust to condition or change in health will improve Outcome: Adequate for Discharge   Problem: Health Behavior/Discharge Planning: Goal: Compliance with prescribed medication regimen will improve Outcome: Adequate for Discharge   Problem: Safety: Goal: Verbalization of understanding the information provided will improve Outcome: Adequate for Discharge   Problem: Self-Concept: Goal: Level of anxiety will decrease Outcome: Adequate for Discharge

## 2021-03-20 NOTE — Discharge Summary (Signed)
Physician Discharge Summary  Judy Meyer [id number] DOB: 1960-12-31 DOA: 03/16/2021  PCP: Pcp, No  Admit date: 03/16/2021 Discharge date: 03/20/2021  Admitted From: Home.  Disposition:  Home.   Recommendations for Outpatient Follow-up:  Follow up with PCP in 1-2 weeks Please obtain BMP/CBC in one week Please follow up with neurology in 4 weeks.   Discharge Condition:Stable.  CODE STATUS:full code.  Diet recommendation: Heart Healthy  Brief/Interim Summary: Judy Meyer is a 60 y.o. female with medical history significant of HTN, HLD, past alcohol use, alcohol associated hepatitis followed by [facility] hepatology, hypothyroidism and migraines who presented with seizure , hepatic encephalopathy.  She appears to be back to baseline today. She is alert and oriented to place,person and time. She is answering all questions appropriately and following commands. .she denies any nausea, vomiting or abdominal pain.  She wants to go home and is adamant about it.     Discharge Diagnoses:  Principal Problem:   Seizure ([place]) Active Problems:   Hypokalemia   Hypothyroidism   Hyperlipidemia   HTN (hypertension)   Acidosis, metabolic   Macrocytic anemia   Acute metabolic encephalopathy   Leukocytosis   AKI (acute kidney injury) ([place])   Hyperammonemia ([facility])  Acute hepatic encephalopathy of unclear etiology.  Improving with lactulose.  Since she is alert and oriented and appears back to baseline.  resume lactulose on discharge.   HSV is negative. Stopped the acyclovir.       Severe hypokalemia:  Replaced, repeat level is 3.4. discharged home on oral potassium daily.  Recommend checking BMP next week and dose potassium as needed.       Hypomagnesemia and hypophosphatemia:  Replaced.  Repeat levels wnl.      Seizures Started on IV Keppra 500 mg twice daily, transition to oral keppra on discharge.  Neurology on board, appreciate recommendations.      Metabolic acidosis:  -  started her on sodium bicarb tablets. - recommend checking levels next week.     Hypothyroidism:  Resume synthroid.      Hypertension;  BP parameters are optimal.    Leukocytosis Mild probably reactive.      Anemia of chronic disease  Normocytic.  Hemoglobin is stable around 10.     Discharge Instructions   Allergies as of 03/20/2021       Reactions   Compazine [prochlorperazine Edisylate] Anaphylaxis   Periactin [cyproheptadine] Anaphylaxis   Cyclobenzaprine Other (See Comments)   Palpations    Haloperidol And Related Other (See Comments)   Caused seizure   Metoclopramide Other (See Comments)   Caused seizure        Medication List     STOP taking these medications    ibuprofen 200 MG tablet Commonly known as: ADVIL   ursodiol 250 MG tablet Commonly known as: ACTIGALL       TAKE these medications    albuterol 108 (90 Base) MCG/ACT inhaler Commonly known as: VENTOLIN HFA Inhale 2 puffs into the lungs 4 (four) times daily as needed for wheezing or shortness of breath.   amLODipine 10 MG tablet Commonly known as: NORVASC Take 1 tablet (10 mg total) by mouth daily.   aspirin-acetaminophen-caffeine 250-250-65 MG tablet Commonly known as: EXCEDRIN MIGRAINE Take 1 tablet by mouth every 6 (six) hours as needed for headache.   Emgality 120 MG/ML Soaj Generic drug: Galcanezumab-gnlm Inject 120 mg into the skin every 30 (thirty) days.   lactulose 10 GM/15ML solution Commonly known as: CHRONULAC Take 30 mLs (  20 g total) by mouth 2 (two) times daily.   levETIRAcetam 500 MG tablet Commonly known as: KEPPRA Take 1 tablet (500 mg total) by mouth 2 (two) times daily.   levothyroxine 75 MCG tablet Commonly known as: SYNTHROID Take 1 tablet (75 mcg total) by mouth daily at 6 (six) AM. Start taking on: March 21, 2021 What changed:  medication strength how much to take   Nortrel 7/7/7 0.5/0.75/1-35 MG-MCG tablet Generic drug: norethindrone-ethinyl  estradiol Take 1 tablet by mouth daily.   ondansetron 4 MG tablet Commonly known as: Zofran Take 1 tablet (4 mg total) by mouth daily as needed for nausea or vomiting.   potassium chloride SA 20 MEQ tablet Commonly known as: KLOR-CON Take 1 tablet (20 mEq total) by mouth daily.   sodium bicarbonate 650 MG tablet Take 1 tablet (650 mg total) by mouth 2 (two) times daily for 7 days.   venlafaxine XR 75 MG 24 hr capsule Commonly known as: EFFEXOR-XR Take 1 capsule (75 mg total) by mouth daily.        Follow-up Information     [facility] Follow up.   Specialty: Home Health Services Why: The home health agency will contact you for the first home visit. Contact information: [address] [place] [address] [phone]                Allergies  Allergen Reactions   Compazine [Prochlorperazine Edisylate] Anaphylaxis   Periactin [Cyproheptadine] Anaphylaxis   Cyclobenzaprine Other (See Comments)    Palpations    Haloperidol And Related Other (See Comments)    Caused seizure   Metoclopramide Other (See Comments)    Caused seizure    Consultations: Neurology.    Procedures/Studies: CT ABDOMEN PELVIS WO CONTRAST  Result Date: 03/16/2021 CLINICAL DATA:  Abdominal pain EXAM: CT ABDOMEN AND PELVIS WITHOUT CONTRAST TECHNIQUE: Multidetector CT imaging of the abdomen and pelvis was performed following the standard protocol without IV contrast. COMPARISON:  CT abdomen and pelvis dated April 8th 2022 FINDINGS: Lower chest: No acute abnormality. Hepatobiliary: No suspicious focal liver lesions. Gallbladder is distended, unchanged compared to prior CT. Limited evaluation of the bile ducts due to motion artifact. Pancreas: Limited evaluation of the pancreatic head due to motion artifact. No evidence of inflammatory change surrounding the pancreatic tail. Spleen: Normal in size without focal abnormality. Adrenals/Urinary Tract: No hydronephrosis.  Punctate nonobstructing stone in the lower pole of the right kidney. 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 Normal bladder. Stomach/Bowel: No evidence of obstruction. Limited evaluation for bowel wall thickening, especially in the upper abdomen due to motion artifact. Normal appendix. Vascular/Lymphatic: Aortic atherosclerosis. No enlarged abdominal or pelvic lymph nodes. Reproductive: Fibroid uterus. Other: No abdominal wall hernia or abnormality. No abdominopelvic ascites. Musculoskeletal: No acute or significant osseous findings. IMPRESSION: Limited exam due to motion artifact, per tech report best images obtainable. No CT findings to explain abdominal pain. Hydropic gallbladder, unchanged compared to prior CT. Fibroid uterus. Electronically Signed   By: Yetta Glassman MD   On: 03/16/2021 14:52   CT HEAD WO CONTRAST  Result Date: 03/16/2021 CLINICAL DATA:  Mental status change, unknown cause seizure, altered mental status EXAM: CT HEAD WITHOUT CONTRAST TECHNIQUE: Contiguous axial images were obtained from the base of the skull through the vertex without intravenous contrast. COMPARISON:  CT head 12/09/2006 FINDINGS: Brain: There is no evidence of acute intracranial hemorrhage, extra-axial fluid collection, or infarct. The  ventricles are not enlarged. There is no midline shift. There is no mass lesion. Incidental note is made of a cavum septum pellucidum. Vascular: No hyperdense vessel or unexpected calcification. Skull: Normal. Negative for fracture or focal lesion. Sinuses/Orbits: The imaged paranasal sinuses are clear. The imaged orbits and globes are unremarkable. Other: The mastoid air cells are clear. IMPRESSION: No acute intracranial pathology or epileptogenic focus identified. Electronically Signed   By: Valetta Mole MD   On: 03/16/2021 10:39   MR BRAIN WO CONTRAST  Result Date: 03/16/2021 CLINICAL DATA:  Seizure, abnormal neuro exam. EXAM: MRI  HEAD WITHOUT CONTRAST TECHNIQUE: Multiplanar, multiecho pulse sequences of the brain and surrounding structures were obtained without intravenous contrast. COMPARISON:  Prior head CT examinations 03/16/2021 and earlier. FINDINGS: Brain: Mild intermittent motion degradation. Mild generalized cerebral and cerebellar atrophy. There is diffusion weighted signal abnormality within the medial right temporal lobe/hippocampus, extending posteriorly to the crus of the fornix (for instance as seen on series 2, images 21-26). Subtle asymmetric T2/FLAIR hyperintensity is also questioned at this site. No appreciable asymmetric hippocampal volume loss. Moderate and age-advanced multifocal T2/FLAIR hyperintensity within the cerebral white matter, nonspecific but most often secondary to chronic small vessel ischemia. No chronic intracranial blood products. No extra-axial fluid collection. No midline shift. Incidentally noted cavum septum pellucidum and cavum vergae. Vascular: Maintained proximal large arterial flow voids. Skull and upper cervical spine: No focal marrow lesion Sinuses/Orbits: Visualized orbits show no acute finding. No significant paranasal sinus disease at the imaged levels. Impression #2 will be called to the ordering clinician or representative by the Radiologist Assistant, and communication documented in the PACS or [employer]. IMPRESSION: Mildly motion degraded exam. Diffusion-weighted and possible T2/FLAIR signal abnormality within the medial right temporal lobe/hippocampus and crus of fornix. Primary considerations are acute signal changes related to recent seizure and/or signal changes related to acute encephalitis (including HSV encephalitis). Correlate clinically, and with CSF analysis as warranted. Additionally, short-interval MRI follow-up is recommended to ensure resolution of this signal abnormality, and to exclude an underlying mass/lesion at this site. Moderate and age-advanced multifocal  T2/FLAIR hyperintense signal changes within the cerebral white matter, nonspecific but most often secondary to chronic small vessel ischemia. Mild generalized parenchymal atrophy. Electronically Signed   By: Kellie Simmering DO   On: 03/16/2021 14:22   EEG adult  Result Date: 03/16/2021 Lora Havens, MD     03/16/2021  3:31 PM Patient Name: Judy Meyer MRN: [id number] Epilepsy Attending: Lora Havens Referring Physician/Provider: Dr Amie Portland Date: 03/16/2021 Duration: 27.21 mins Patient history: 60 year old woman with history of seizures not on antiepileptics who presented with seizure-like activity.  EEG to evaluate for seizures. Level of alertness: Awake AEDs during EEG study: Keppra Technical aspects: This EEG study was done with scalp electrodes positioned according to the 10-20 International system of electrode placement. Electrical activity was acquired at a sampling rate of '500Hz'$  and reviewed with a high frequency filter of '70Hz'$  and a low frequency filter of '1Hz'$ . EEG data were recorded continuously and digitally stored. Description: The posterior dominant rhythm consists of 8 Hz activity of moderate voltage (25-35 uV) seen predominantly in posterior head regions, symmetric and reactive to eye opening and eye closing. Lateralized periodic discharges were noted arising from right hemisphere, maximal right frontotemporal region at 1455 with some evolution in frequency.  However routine EEG ended and no definite evolution was noted in morphology or space. Hyperventilation and photic stimulation were not performed.   ABNORMALITY -  Lateralized periodic discharges ( LPD ) right hemisphere, maximal right frontotemporal region IMPRESSION: This study showed evidence of epileptogenicity arising from right hemisphere, maximal right frontotemporal region likely due to underlying structural abnormality with high potential for seizure recurrence.  One episode was noted at 1455 during which lateralized periodic  discharges appear to evolve in frequency.  However, routine EEG ended and no definite evolution was noted in morphology or space. Dr Rory Percy was notified. Recommend long-term monitoring for further evaluation Priyanka O Yadav   Overnight EEG with video  Result Date: 03/17/2021 Lora Havens, MD     03/17/2021 11:48 AM Patient Name: Judy Meyer MRN: [id number] Epilepsy Attending: Lora Havens Referring Physician/Provider: Dr Amie Portland Duration: 03/16/2021 1506 to 03/17/2021 1036  Patient history: 60 year old woman with history of seizures not on antiepileptics who presented with seizure-like activity.  EEG to evaluate for seizures.  Level of alertness: Awake, asleep  AEDs during EEG study: Keppra  Technical aspects: This EEG study was done with scalp electrodes positioned according to the 10-20 International system of electrode placement. Electrical activity was acquired at a sampling rate of '500Hz'$  and reviewed with a high frequency filter of '70Hz'$  and a low frequency filter of '1Hz'$ . EEG data were recorded continuously and digitally stored.  Description: The posterior dominant rhythm consists of 8 Hz activity of moderate voltage (25-35 uV) seen predominantly in posterior head regions, symmetric and reactive to eye opening and eye closing.  Sleep was characterized by sleep spindles (12 to 14 Hz), maximum frontocentral region. Lateralized periodic discharges were noted arising from right hemisphere, which at times appeared rhythmic with fluctuating frequency between 1 to 2 Hz without definite evolution.  EEG also showed continuous 3 to 5 Hz theta-delta slowing in right hemisphere.  Hyperventilation and photic stimulation were not performed.    ABNORMALITY - Lateralized periodic discharges ( LPD+R) right hemisphere -Continuous slow, right hemisphere  IMPRESSION: This study showed evidence of epileptogenicity and cortical dysfunction arising from right hemisphere, likely due to underlying structural abnormality.   The lateralized periodic discharges were rhythmic at times with fluctuating frequency between 1 to 2 Hz without definite evolution.  This EEG pattern is on the ictal-interictal continuum with high potential for seizure recurrence.   Priyanka [name]      Subjective: Pt denies any chest pain or sob, alert and oriented,  Adamant about going home.   Discharge Exam: Vitals:   03/20/21 0722 03/20/21 1145  BP: (!) 147/83 (!) 145/70  Pulse: 75 81  Resp: 18 20  Temp: 98.6 F (37 C) 98.8 F (37.1 C)  SpO2: 98% 99%   Vitals:   03/19/21 2100 03/19/21 2325 03/20/21 0722 03/20/21 1145  BP: 134/62 140/75 (!) 147/83 (!) 145/70  Pulse: 90 86 75 81  Resp: '18 18 18 20  '$ Temp: 98 F (36.7 C) 98.2 F (36.8 C) 98.6 F (37 C) 98.8 F (37.1 C)  TempSrc: Oral Oral Oral Oral  SpO2: 99% 99% 98% 99%  Weight:      Height:        General: Pt is alert, awake, not in acute distress Cardiovascular: RRR, S1/S2 +, no rubs, no gallops Respiratory: CTA bilaterally, no wheezing, no rhonchi Abdominal: Soft, NT, ND, bowel sounds + Extremities: no edema, no cyanosis    The results of significant diagnostics from this hospitalization (including imaging, microbiology, ancillary and laboratory) are listed below for reference.     Microbiology: Recent Results (from the past 240 hour(s))  Resp Panel  by RT-PCR (Flu A&B, Covid) Nasopharyngeal Swab     Status: None   Collection Time: 03/16/21 11:43 AM   Specimen: Nasopharyngeal Swab; Nasopharyngeal(NP) swabs in vial transport medium  Result Value Ref Range Status   SARS Coronavirus 2 by RT PCR NEGATIVE NEGATIVE Final    Comment: (NOTE) SARS-CoV-2 target nucleic acids are NOT DETECTED.  The SARS-CoV-2 RNA is generally detectable in upper respiratory specimens during the acute phase of infection. The lowest concentration of SARS-CoV-2 viral copies this assay can detect is 138 copies/mL. A negative result does not preclude SARS-Cov-2 infection and should not  be used as the sole basis for treatment or other patient management decisions. A negative result may occur with  improper specimen collection/handling, submission of specimen other than nasopharyngeal swab, presence of viral mutation(s) within the areas targeted by this assay, and inadequate number of viral copies(<138 copies/mL). A negative result must be combined with clinical observations, patient history, and epidemiological information. The expected result is Negative.  Fact Sheet for Patients:  EntrepreneurPulse.com.au  Fact Sheet for Healthcare Providers:  IncredibleEmployment.be  This test is no t yet approved or cleared by the [place] FDA and  has been authorized for detection and/or diagnosis of SARS-CoV-2 by FDA under an Emergency Use Authorization (EUA). This EUA will remain  in effect (meaning this test can be used) for the duration of the COVID-19 declaration under Section 564(b)(1) of the Act, 21 U.S.C.section 360bbb-3(b)(1), unless the authorization is terminated  or revoked sooner.       Influenza A by PCR NEGATIVE NEGATIVE Final   Influenza B by PCR NEGATIVE NEGATIVE Final    Comment: (NOTE) The Xpert Xpress SARS-CoV-2/FLU/RSV plus assay is intended as an aid in the diagnosis of influenza from Nasopharyngeal swab specimens and should not be used as a sole basis for treatment. Nasal washings and aspirates are unacceptable for Xpert Xpress SARS-CoV-2/FLU/RSV testing.  Fact Sheet for Patients: EntrepreneurPulse.com.au  Fact Sheet for Healthcare Providers: IncredibleEmployment.be  This test is not yet approved or cleared by the [place] FDA and has been authorized for detection and/or diagnosis of SARS-CoV-2 by FDA under an Emergency Use Authorization (EUA). This EUA will remain in effect (meaning this test can be used) for the duration of the COVID-19 declaration under Section  564(b)(1) of the Act, 21 U.S.C. section 360bbb-3(b)(1), unless the authorization is terminated or revoked.  Performed at [facility], [place] [address] [place], [place] [address]   CSF culture w Gram Stain     Status: None   Collection Time: 03/16/21  4:39 PM   Specimen: CSF; Cerebrospinal Fluid  Result Value Ref Range Status   Specimen Description CSF  Final   Special Requests NONE  Final   Gram Stain   Final    WBC PRESENT, PREDOMINANTLY MONONUCLEAR NO ORGANISMS SEEN CYTOSPIN SMEAR    Culture   Final    NO GROWTH Performed at [facility], [place] [address] [place], [place] [address]    Report Status 03/20/2021 FINAL  Final     Labs: BNP (last 3 results) No results for input(s): BNP in the last 8760 hours. Basic Metabolic Panel: Recent Labs  Lab 03/16/21 1022 03/16/21 1601 03/17/21 0428 03/17/21 1810 03/17/21 2115 03/18/21 0301 03/18/21 1751 03/19/21 0143 03/19/21 1748 03/20/21 0546  NA 138   < > 136 139  --  137  --  137  --  135  K 2.3*   < > 2.1* 2.6*  --  2.6* 2.7* 2.9* 2.4* 3.4*  CL 105   < > 104 105  --  108  --  111  --  111  CO2 11*   < > 16* 16*  --  14*  --  14*  --  14*  GLUCOSE 173*   < > 95 125*  --  104*  --  95  --  89  BUN 8   < > 6 7  --  6  --  <5*  --  5*  CREATININE 1.36*   < > 1.00 1.02*  --  1.01*  --  1.02*  --  0.97  CALCIUM 9.3   < > 9.0 9.7  --  9.7  --  9.4  --  8.8*  MG 2.1  --   --  2.2 2.1  --   --  1.7  --  2.0  PHOS  --   --   --   --   --   --   --  2.1*  --  2.5   < > = values in this interval not displayed.   Liver Function Tests: Recent Labs  Lab 03/16/21 1022 03/17/21 0428  AST 41 31  ALT 19 16  ALKPHOS 83 76  BILITOT 0.8 0.8  PROT 7.9 7.7  ALBUMIN 3.6 3.7   No results for input(s): LIPASE, AMYLASE in the last 168 hours. Recent Labs  Lab 03/16/21 1003 03/17/21 0428 03/18/21 0848 03/20/21 0546  AMMONIA 110* 91* 67* 87*   CBC: Recent Labs  Lab 03/16/21 1007 03/16/21 1022 03/17/21 0428  03/18/21 0301  WBC  --  19.3* 10.1 10.8*  NEUTROABS  --  6.1  --   --   HGB 10.9* 10.7* 10.1* 10.7*  HCT 32.0* 36.1 34.5* 33.4*  MCV  --  91.6 92.0 86.1  PLT  --  564* 433* 479*   Cardiac Enzymes: Recent Labs  Lab 03/16/21 2017  CKTOTAL 773*   BNP: Invalid input(s): POCBNP CBG: Recent Labs  Lab 03/16/21 0944  GLUCAP 177*   D-Dimer No results for input(s): DDIMER in the last 72 hours. Hgb A1c No results for input(s): HGBA1C in the last 72 hours. Lipid Profile No results for input(s): CHOL, HDL, LDLCALC, TRIG, CHOLHDL, LDLDIRECT in the last 72 hours. Thyroid function studies No results for input(s): TSH, T4TOTAL, T3FREE, THYROIDAB in the last 72 hours.  Invalid input(s): FREET3 Anemia work up No results for input(s): VITAMINB12, FOLATE, FERRITIN, TIBC, IRON, RETICCTPCT in the last 72 hours. Urinalysis    Component Value Date/Time   COLORURINE STRAW (A) 03/16/2021 1416   APPEARANCEUR CLEAR 03/16/2021 1416   LABSPEC 1.006 03/16/2021 1416   PHURINE 7.0 03/16/2021 1416   GLUCOSEU NEGATIVE 03/16/2021 1416   HGBUR SMALL (A) 03/16/2021 1416   BILIRUBINUR NEGATIVE 03/16/2021 1416   [place] 03/16/2021 1416   PROTEINUR NEGATIVE 03/16/2021 1416   NITRITE NEGATIVE 03/16/2021 1416   LEUKOCYTESUR NEGATIVE 03/16/2021 1416   Sepsis Labs Invalid input(s): PROCALCITONIN,  WBC,  LACTICIDVEN Microbiology Recent Results (from the past 240 hour(s))  Resp Panel by RT-PCR (Flu A&B, Covid) Nasopharyngeal Swab     Status: None   Collection Time: 03/16/21 11:43 AM   Specimen: Nasopharyngeal Swab; Nasopharyngeal(NP) swabs in vial transport medium  Result Value Ref Range Status   SARS Coronavirus 2 by RT PCR NEGATIVE NEGATIVE Final    Comment: (NOTE) SARS-CoV-2 target nucleic acids are NOT DETECTED.  The SARS-CoV-2 RNA is generally detectable in upper respiratory  specimens during the acute phase of infection. The lowest concentration of SARS-CoV-2 viral copies this assay  can detect is 138 copies/mL. A negative result does not preclude SARS-Cov-2 infection and should not be used as the sole basis for treatment or other patient management decisions. A negative result may occur with  improper specimen collection/handling, submission of specimen other than nasopharyngeal swab, presence of viral mutation(s) within the areas targeted by this assay, and inadequate number of viral copies(<138 copies/mL). A negative result must be combined with clinical observations, patient history, and epidemiological information. The expected result is Negative.  Fact Sheet for Patients:  EntrepreneurPulse.com.au  Fact Sheet for Healthcare Providers:  IncredibleEmployment.be  This test is no t yet approved or cleared by the [place] FDA and  has been authorized for detection and/or diagnosis of SARS-CoV-2 by FDA under an Emergency Use Authorization (EUA). This EUA will remain  in effect (meaning this test can be used) for the duration of the COVID-19 declaration under Section 564(b)(1) of the Act, 21 U.S.C.section 360bbb-3(b)(1), unless the authorization is terminated  or revoked sooner.       Influenza A by PCR NEGATIVE NEGATIVE Final   Influenza B by PCR NEGATIVE NEGATIVE Final    Comment: (NOTE) The Xpert Xpress SARS-CoV-2/FLU/RSV plus assay is intended as an aid in the diagnosis of influenza from Nasopharyngeal swab specimens and should not be used as a sole basis for treatment. Nasal washings and aspirates are unacceptable for Xpert Xpress SARS-CoV-2/FLU/RSV testing.  Fact Sheet for Patients: EntrepreneurPulse.com.au  Fact Sheet for Healthcare Providers: IncredibleEmployment.be  This test is not yet approved or cleared by the [place] FDA and has been authorized for detection and/or diagnosis of SARS-CoV-2 by FDA under an Emergency Use Authorization (EUA). This EUA will  remain in effect (meaning this test can be used) for the duration of the COVID-19 declaration under Section 564(b)(1) of the Act, 21 U.S.C. section 360bbb-3(b)(1), unless the authorization is terminated or revoked.  Performed at [facility], [place] [address] [place], [place] [address]   CSF culture w Gram Stain     Status: None   Collection Time: 03/16/21  4:39 PM   Specimen: CSF; Cerebrospinal Fluid  Result Value Ref Range Status   Specimen Description CSF  Final   Special Requests NONE  Final   Gram Stain   Final    WBC PRESENT, PREDOMINANTLY MONONUCLEAR NO ORGANISMS SEEN CYTOSPIN SMEAR    Culture   Final    NO GROWTH Performed at [facility], [place] [address], [place], [place] [address]    Report Status 03/20/2021 FINAL  Final     Time coordinating discharge: 36 minutes.  SIGNED:   Hosie Poisson, MD  [facility] 03/20/2021, 5:43 PM

## 2021-03-20 NOTE — Plan of Care (Signed)
  Problem: Health Behavior/Discharge Planning: Goal: Ability to manage health-related needs will improve Outcome: Adequate for Discharge   

## 2021-05-15 ENCOUNTER — Other Ambulatory Visit (INDEPENDENT_AMBULATORY_CARE_PROVIDER_SITE_OTHER): Payer: Self-pay | Admitting: Obstetrics & Gynecology

## 2021-05-15 MED ORDER — NORTREL 7/7/7 (28) 0.5 MG/0.75 MG/1 MG-35 MCG TABLET
ORAL_TABLET | ORAL | 3 refills | Status: AC
Start: 2021-05-15 — End: ?

## 2021-05-15 NOTE — Telephone Encounter (Addendum)
LOV 04/19/2019          Regarding: refill req  ----- Message from Darliss Ridgel sent at 05/15/2021  2:02 PM EDT -----  [name] Pt      The pt is calling asking for a refill on her bc. Pt states she has fibroids and has been out of her medication for over a week now and is bleeding heavily.     Current Outpatient Medications:  Norethin-Eth Estrad Triphasic (NORTREL 7/7/7, 28,) 0.5/0.75/1 mg- 35 mcg Oral Tablet, 1 po daily        Preferred Pharmacy     [id number] [place], [place] - [address]    [place] [address]    Phone: [phone] Fax: [phone]    Hours: Not open 24 hours        Thank you

## 2021-05-28 ENCOUNTER — Emergency Department
Admission: EM | Admit: 2021-05-28 | Discharge: 2021-05-28 | Disposition: A | Discharge status: Home / Self Care | Payer: Commercial Managed Care - PPO | Attending: Physician Assistant | Admitting: Physician Assistant

## 2021-05-28 ENCOUNTER — Encounter (HOSPITAL_COMMUNITY): Payer: Self-pay

## 2021-05-28 ENCOUNTER — Other Ambulatory Visit: Payer: Self-pay

## 2021-05-28 DIAGNOSIS — R067 Sneezing: Secondary | ICD-10-CM | POA: Insufficient documentation

## 2021-05-28 DIAGNOSIS — E876 Hypokalemia: Secondary | ICD-10-CM

## 2021-05-28 DIAGNOSIS — J029 Acute pharyngitis, unspecified: Secondary | ICD-10-CM | POA: Insufficient documentation

## 2021-05-28 DIAGNOSIS — R519 Headache, unspecified: Secondary | ICD-10-CM | POA: Insufficient documentation

## 2021-05-28 DIAGNOSIS — R112 Nausea with vomiting, unspecified: Secondary | ICD-10-CM | POA: Insufficient documentation

## 2021-05-28 DIAGNOSIS — F17210 Nicotine dependence, cigarettes, uncomplicated: Secondary | ICD-10-CM | POA: Insufficient documentation

## 2021-05-28 DIAGNOSIS — B349 Viral infection, unspecified: Secondary | ICD-10-CM

## 2021-05-28 LAB — CBC WITH DIFF
BASOPHIL #: <0.04 10*3/uL (ref <=0.20)
BASOPHIL %: 0 %
EOSINOPHIL #: <0.04 10*3/uL (ref <=0.50)
EOSINOPHIL %: 0 %
HCT: 34.8 % (ref 34.8–46.0)
HGB: 11.2 g/dL — ABNORMAL LOW (ref 11.5–16.0)
IMMATURE GRANULOCYTE #: <0.04 10*3/uL (ref <0.10)
IMMATURE GRANULOCYTE %: 0 % (ref 0–1)
LYMPHOCYTE #: 3.15 10*3/uL (ref 1.00–4.80)
LYMPHOCYTE %: 33 %
MCH: 24.9 pg — ABNORMAL LOW (ref 26.0–32.0)
MCHC: 32.2 g/dL (ref 31.0–35.5)
MCV: 77.5 fL — ABNORMAL LOW (ref 78.0–100.0)
MONOCYTE #: 0.6 10*3/uL (ref 0.20–1.10)
MONOCYTE %: 6 %
MPV: 10.7 fL (ref 8.7–12.5)
NEUTROPHIL #: 5.83 10*3/uL (ref 1.50–7.70)
NEUTROPHIL %: 61 %
PLATELETS: 416 10*3/uL — ABNORMAL HIGH (ref 150–400)
RBC: 4.49 10*6/uL (ref 3.85–5.22)
RDW-CV: 18.7 % — ABNORMAL HIGH (ref 11.5–15.5)
WBC: 9.6 10*3/uL

## 2021-05-28 LAB — HEPATIC FUNCTION PANEL
ALBUMIN: 4.2 g/dL (ref 3.5–5.0)
ALKALINE PHOSPHATASE: 89 U/L (ref 50–130)
ALT (SGPT): 18 U/L (ref <55)
AST (SGOT): 20 U/L (ref 8–41)
BILIRUBIN DIRECT: 0.2 mg/dL (ref <0.3)
BILIRUBIN TOTAL: 0.3 mg/dL (ref 0.3–1.3)
PROTEIN TOTAL: 8.5 g/dL — ABNORMAL HIGH (ref 6.4–8.3)

## 2021-05-28 LAB — COVID-19, FLU A/B, RSV RAPID BY PCR
INFLUENZA VIRUS TYPE A: NOT DETECTED
INFLUENZA VIRUS TYPE B: NOT DETECTED
RESPIRATORY SYNCTIAL VIRUS (RSV): NOT DETECTED
SARS-CoV-2: NOT DETECTED

## 2021-05-28 LAB — ECG 12 LEAD
Atrial Rate: 81 {beats}/min
Calculated P Axis: 70 degrees
Calculated R Axis: 64 degrees
Calculated T Axis: 29 degrees
PR Interval: 144 ms
QRS Duration: 90 ms
QT Interval: 316 ms
QTC Calculation: 367 ms
Ventricular rate: 81 {beats}/min

## 2021-05-28 LAB — LIPASE: LIPASE: 54 U/L (ref 10–80)

## 2021-05-28 LAB — BASIC METABOLIC PANEL
ANION GAP: 18 mmol/L — ABNORMAL HIGH (ref 4–13)
BUN/CREA RATIO: 7 (ref 6–22)
BUN: 10 mg/dL (ref 8–25)
CALCIUM: 10.7 mg/dL — ABNORMAL HIGH (ref 8.5–10.2)
CHLORIDE: 110 mmol/L (ref 96–111)
CO2 TOTAL: 11 mmol/L — ABNORMAL LOW (ref 22–32)
CREATININE: 1.46 mg/dL — ABNORMAL HIGH (ref 0.49–1.10)
ESTIMATED GFR: 41 mL/min/{1.73_m2} — ABNORMAL LOW (ref >60)
GLUCOSE: 100 mg/dL (ref 65–125)
POTASSIUM: 2.1 mmol/L — CL (ref 3.5–5.1)
SODIUM: 139 mmol/L (ref 136–145)

## 2021-05-28 LAB — URINALYSIS, MICROSCOPIC

## 2021-05-28 LAB — URINALYSIS, MACROSCOPIC
BILIRUBIN: NEGATIVE mg/dL
COLOR: NORMAL
GLUCOSE: NEGATIVE mg/dL
KETONES: 5 mg/dL — AB
NITRITE: NEGATIVE
PH: 6 (ref 5.0–8.0)
PROTEIN: 30 mg/dL — AB
SPECIFIC GRAVITY: 1.02 (ref 1.005–1.030)
UROBILINOGEN: NEGATIVE mg/dL

## 2021-05-28 LAB — HCG, PLASMA OR SERUM QUANTITATIVE, PREGNANCY: HCG QUANTITATIVE PREGNANCY: <1 IU/L (ref <15)

## 2021-05-28 MED ORDER — SODIUM CHLORIDE 0.9 % IV BOLUS
1000.0000 mL | INJECTION | Status: AC
Start: 2021-05-28 — End: 2021-05-28
  Administered 2021-05-28: 19:00:00 0 mL via INTRAVENOUS
  Administered 2021-05-28: 19:00:00 1000 mL via INTRAVENOUS

## 2021-05-28 MED ORDER — POTASSIUM BICARBONATE-CITRIC ACID 20 MEQ EFFERVESCENT TABLET
40.0000 meq | EFFERVESCENT_TABLET | ORAL | Status: AC
Start: 2021-05-28 — End: 2021-05-28
  Administered 2021-05-28: 20:00:00 40 meq via ORAL
  Filled 2021-05-28: qty 2

## 2021-05-28 MED ORDER — ONDANSETRON 4 MG DISINTEGRATING TABLET
4.0000 mg | ORAL_TABLET | Freq: Three times a day (TID) | ORAL | 0 refills | Status: AC | PRN
Start: 2021-05-28 — End: ?

## 2021-05-28 MED ORDER — ONDANSETRON HCL (PF) 4 MG/2 ML INJECTION SOLUTION
4.0000 mg | INTRAMUSCULAR | Status: AC
Start: 2021-05-28 — End: 2021-05-28
  Administered 2021-05-28: 19:00:00 4 mg via INTRAVENOUS
  Filled 2021-05-28: qty 2

## 2021-05-28 MED ORDER — POTASSIUM CHLORIDE ER 10 MEQ TABLET,EXTENDED RELEASE(PART/CRYST)
10.0000 meq | ORAL_TABLET | Freq: Every day | ORAL | 0 refills | Status: DC
Start: 2021-05-28 — End: 2021-06-11

## 2021-05-28 NOTE — ED Triage Notes (Signed)
Pt voices flu like symptoms for around two weeks.  Pt voices vomiting and unable to eat for three days.  Pt voices she was hospitalized for over a year for "liver problems" and has only been out around one month.

## 2021-05-28 NOTE — Discharge Instructions (Addendum)
Rest, force clear liquids, healthy bland diet rich in potassium. Take K-dur for low potassium.   Zofran as needed for nausea and vomiting.  Take Motrin for pain or fever.  Return to the emergency department if worse or new symptoms.  Follow-up with Dr. Jearld Shines in 2-3 days for recheck.

## 2021-05-28 NOTE — ED Provider Notes (Signed)
Department of Emergency Medicine  HPI - 05/28/2021      Resident/Advanced Practice Provider: Ileene Rubens PA-C   Attending: Dr. Scharlene Gloss    Chief Complaint:   Flu-like symptoms  History of Present Illness:   Teresa Ramirez, 60 y.o. female   Pt reports to the ED via car with c/o flu-like symptoms x 2 weeks.  Reports nausea vomiting unable to eat for the past 3 days.  She complains of a headache, sneezing, stuffy nose.  Last emesis was on the way to the hospital.  She has slight diarrhea.  Denies any fever chills.  She has a sore throat secondary to throwing up.  Denies any earache.  No chest pain or shortness of breath.  No abdominal pain.  No urinary complaints.  She has some occasional vaginal bleeding due to history of uterine fibroids.  Denies any vaginal discharge.  Denies any past surgical history on the abdomen.  Blood PCP is Dr. Theora Gianotti.  Allergy to Compazine, Haldol, Periactin, Phenergan, Reglan.  Patient reports she was hospitalized for over a year due to liver problems in [place] and has only been out of the hospital for about 1 month.       History Limitations: none    Review of Systems:   Constitutional: No fever, chills, or weakness   Skin: No rashes or diaphoresis   HENT:  + congestion.  + sore throat.  No ear pain.    Eyes: No vision changes or discharge   Cardio: No chest pain, palpitations, or leg swelling    Respiratory: No cough, wheezing, or SOB   GI:  + nausea, vomiting and slight diarrhea.  No constipation, or abdominal pain   GU:  No dysuria, hematuria, or polyuria. No vaginal bleeding or discharge.     MSK: No joint or back pain   Neuro: No loss of sensation, focal deficits, or LOC.  + headache.    All other systems reviewed and are negative.    Medications:  None       Allergies:  Allergies   Allergen Reactions   . Compazine [Prochlorperazine Edisylate] Seizure   . Haldol [Haloperidol]    . Periactin [Cyproheptadine] Anaphylaxis   . Phenergan [Promethazine]  Seizure   . Reglan [Metoclopramide] Seizure       Past Medical History:  Past Medical History:   Diagnosis Date   . Angiomyolipoma of kidney    . Asthma    . CVA (cerebrovascular accident) (CMS [place])    . Environmental allergies    . Epilepsy (CMS [place])    . HTN    . Hypothyroid    . IBS (irritable bowel syndrome)    . Insomnia    . Migraines            Past Surgical History:  Past Surgical History:   Procedure Laterality Date   . HX HAND SURGERY               Social History:  Social History     Socioeconomic History   . Marital status: Single     Spouse name: Not on file   . Number of children: Not on file   . Years of education: Not on file   . Highest education level: Not on file   Occupational History   . Not on file   Tobacco Use   . Smoking status: Some Days     Packs/day: 0.10     Years: 3.00  Pack years: 0.30     Types: Cigarettes   . Smokeless tobacco: Never   . Tobacco comments:     "maybe about a pack a month, on and off for 3 years"   Substance and Sexual Activity   . Alcohol use: No   . Drug use: No     Comment: past use of marijuana in college   . Sexual activity: Yes     Partners: Male     Birth control/protection: Pill     Comment: has a boyfriend and is monogamous with him.    Other Topics Concern   . Not on file   Social History Narrative   . Not on file     Social Determinants of Health     Financial Resource Strain: Not on file   Food Insecurity: Not on file   Transportation Needs: Not on file   Physical Activity: Not on file   Stress: Not on file   Intimate Partner Violence: Not on file   Housing Stability: Not on file       Family History:  Family Medical History:     Problem Relation (Age of Onset)    Elevated Lipids Mother    Heart Attack Maternal Grandfather (18), Maternal Grandmother (70)    Hypertension (High Blood Pressure) Maternal Grandmother, Mother              Physical Exam:  All nurse's notes reviewed.  Filed Vitals:    05/28/21 1900 05/28/21 1945 05/28/21 2000 05/28/21 2047   BP:  (!) 154/64 134/75 (!) 151/80 (!) 152/74   Pulse:    83   Resp:    18   Temp:       SpO2: 100% 100% 100% 100%          Constitutional: vital signs as above. NAD. A+Ox3   HENT:    Head: NC AT    Mouth/Throat: Oropharynx is clear and moist.  No tonsillar hypertrophy, erythema or exudate   Eyes:  Normal conjunctivae without discharge or redness   Neck: Supple.  Full range of motion, no adenopathy   Cardiovascular: RRR, No murmurs, rubs, or gallops.    Pulmonary/Chest: BS equal bilaterally, good air movement. No respiratory distress. No wheezes, rales, or chest tenderness.    Abdominal: BS +. Abdomen soft. No tenderness, rebound, or guarding.    GU:  No CVAT               Musculoskeletal: No obvious deformity or swelling noted.     Skin: Warm and dry. No rash, erythema, pallor, or cyanosis     Neurological: Alert&Ox3. Grossly intact.     Labs:  Results for orders placed or performed during the hospital encounter of 05/28/21 (from the past 24 hour(s))   BASIC METABOLIC PANEL   Result Value Ref Range    SODIUM 139 136 - 145 mmol/L    POTASSIUM 2.1 (LL) 3.5 - 5.1 mmol/L    CHLORIDE 110 96 - 111 mmol/L    CO2 TOTAL 11 (L) 22 - 32 mmol/L    ANION GAP 18 (H) 4 - 13 mmol/L    CALCIUM 10.7 (H) 8.5 - 10.2 mg/dL    GLUCOSE 100 65 - 125 mg/dL    BUN 10 8 - 25 mg/dL    CREATININE 1.46 (H) 0.49 - 1.10 mg/dL    BUN/CREA RATIO 7 6 - 22    ESTIMATED GFR 41 (L) >60 mL/min/1.29m2    Narrative  Estimated Glomerular Filtration Rate (eGFR) is calculated using the CKD-EPI (2021) equation, intended for patients 70 years of age and older. If gender is not documented or "unknown", there will be no eGFR calculation.   CBC/DIFF    Narrative    The following orders were created for panel order CBC/DIFF.  Procedure                               Abnormality         Status                     ---------                               -----------         ------                     CBC WITH OFBP[[id number]]                Abnormal             Final result                 Please view results for these tests on the individual orders.   HEPATIC FUNCTION PANEL   Result Value Ref Range    ALBUMIN 4.2 3.5 - 5.0 g/dL    ALKALINE PHOSPHATASE 89 50 - 130 U/L    ALT (SGPT) 18 <55 U/L    AST (SGOT) 20 8 - 41 U/L    BILIRUBIN TOTAL 0.3 0.3 - 1.3 mg/dL    BILIRUBIN DIRECT 0.2 <0.3 mg/dL    PROTEIN TOTAL 8.5 (H) 6.4 - 8.3 g/dL   LIPASE   Result Value Ref Range    LIPASE 54 10 - 80 U/L   HCG, PLASMA OR SERUM QUANTITATIVE, PREGNANCY   Result Value Ref Range    HCG QUANTITATIVE PREGNANCY <1 <15 IU/L   COVID-19, FLU A & B, RSV   Result Value Ref Range    SARS-CoV-2 Not Detected Not Detected    INFLUENZA VIRUS TYPE A Not Detected Not Detected    INFLUENZA VIRUS TYPE B Not Detected Not Detected    RESPIRATORY SYNCTIAL VIRUS (RSV) Not Detected Not Detected    Narrative    Results are for the simultaneous qualitative identification of SARS-CoV-2 (formerly 2019-nCoV), Influenza A, Influenza B, and RSV RNA. These etiologic agents are generally detectable in nasopharyngeal and nasal swabs during the ACUTE PHASE of infection. Hence, this test is intended to be performed on respiratory specimens collected from individuals with signs and symptoms of upper respiratory tract infection who meet Centers for Disease Control and Prevention (CDC) clinical and/or epidemiological criteria for Coronavirus Disease 2019 (COVID-19) testing. CDC COVID-19 criteria for testing on human specimens is available at [facility] webpage information for Healthcare Professionals: Coronavirus Disease 2019 (COVID-19) ([email or web address]).     False-negative results may occur if the virus has genomic mutations, insertions, deletions, or rearrangements or if performed very early in the course of illness. Otherwise, negative results indicate virus specific RNA targets are not detected, however negative results do not preclude SARS-CoV-2 infection/COVID-19, Influenza, or  Respiratory syncytial virus infection. Results should not be used as the sole basis for patient management decisions. Negative results must be combined with clinical observations, patient history, and epidemiological information. If upper respiratory tract infection is  still suspected based on exposure history together with other clinical findings, re-testing should be considered.    Disclaimer:   This assay has been authorized by FDA under an Emergency Use Authorization for use in laboratories certified under the Clinical Laboratory Improvement Amendments of 1988 (CLIA), 42 U.S.C. [phone], to perform high complexity tests. The impacts of vaccines, antiviral therapeutics, antibiotics, chemotherapeutic or immunosuppressant drugs have not been evaluated.     Test methodology:   Cepheid Xpert Xpress SARS-CoV-2/Flu/RSV Assay real-time polymerase chain reaction (RT-PCR) test on the GeneXpert Dx and Xpert Xpress systems.   CBC WITH DIFF   Result Value Ref Range    WBC 9.6   x10^3/uL    RBC 4.49 3.85 - 5.22 x10^6/uL    HGB 11.2 (L) 11.5 - 16.0 g/dL    HCT 34.8 34.8 - 46.0 %    MCV 77.5 (L) 78.0 - 100.0 fL    MCH 24.9 (L) 26.0 - 32.0 pg    MCHC 32.2 31.0 - 35.5 g/dL    RDW-CV 18.7 (H) 11.5 - 15.5 %    PLATELETS 416 (H) 150 - 400 x10^3/uL    MPV 10.7 8.7 - 12.5 fL    NEUTROPHIL % 61 %    LYMPHOCYTE % 33 %    MONOCYTE % 6 %    EOSINOPHIL % 0 %    BASOPHIL % 0 %    NEUTROPHIL # 5.83 1.50 - 7.70 x10^3/uL    LYMPHOCYTE # 3.15 1.00 - 4.80 x10^3/uL    MONOCYTE # 0.60 0.20 - 1.10 x10^3/uL    EOSINOPHIL # <0.04 <=0.50 x10^3/uL    BASOPHIL # <0.04 <=0.20 x10^3/uL    IMMATURE GRANULOCYTE % 0 0 - 1 %    IMMATURE GRANULOCYTE # <0.04 <0.10 x10^3/uL   URINALYSIS, MACROSCOPIC AND MICROSCOPIC W/CULTURE REFLEX    Specimen: Urine, Clean Catch    Narrative    The following orders were created for panel order URINALYSIS, MACROSCOPIC AND MICROSCOPIC W/CULTURE REFLEX.  Procedure                               Abnormality         Status                      ---------                               -----------         ------                     URINALYSIS, MACROSCOPIC[[id number]]      Abnormal            Final result               URINALYSIS, MICROSCOPIC[[id number]]      Abnormal            Final result                 Please view results for these tests on the individual orders.   URINALYSIS, MACROSCOPIC   Result Value Ref Range    SPECIFIC GRAVITY 1.020 1.005 - 1.030    GLUCOSE Negative Negative, 30  mg/dL    PROTEIN 30 (A) Negative, 10  mg/dL    BILIRUBIN Negative Negative mg/dL    UROBILINOGEN Negative Negative mg/dL  PH 6.0 5.0 - 8.0    BLOOD Large (A) Negative mg/dL    KETONES 5 (A) Negative mg/dL    NITRITE Negative Negative    LEUKOCYTES Trace (A) Negative WBCs/uL    APPEARANCE Cloudy (A) Clear    COLOR Normal (Yellow) Normal (Yellow)   URINALYSIS, MICROSCOPIC   Result Value Ref Range    WBCS 10-20 (A) 2-5, 5-10, 0-2, None /hpf    RBCS 50-100 (A) 2-5, 0-2, None /hpf    BACTERIA Several (A) Occasional or less /hpf    SQUAMOUS EPITHELIAL CELLS Several (A) Occasional or less /lpf       Imaging:  None Indicated          Orders Placed This Encounter   . URINE CULTURE,ROUTINE   . BASIC METABOLIC PANEL   . CBC/DIFF   . HEPATIC FUNCTION PANEL   . LIPASE   . HCG, PLASMA OR SERUM QUANTITATIVE, PREGNANCY   . COVID-19, FLU A & B, RSV   . CBC WITH DIFF   . URINALYSIS, MACROSCOPIC AND MICROSCOPIC W/CULTURE REFLEX   . URINALYSIS, MACROSCOPIC   . URINALYSIS, MICROSCOPIC   . ECG 12 LEAD   . NS bolus infusion 1,000 mL   . ondansetron (ZOFRAN) 2 mg/mL injection   . potassium bicarbonate-citric acid (EFFER-K) effervescent tablet   . ondansetron (ZOFRAN ODT) 4 mg Oral Tablet, Rapid Dissolve   . potassium chloride (K-DUR) 10 mEq Oral Tab Sust.Rel. Particle/Crystal       Abnormal Lab results:  Labs Reviewed   BASIC METABOLIC PANEL - Abnormal; Notable for the following components:       Result Value    POTASSIUM 2.1 (*)     CO2 TOTAL 11 (*)     ANION GAP 18 (*)     CALCIUM 10.7  (*)     CREATININE 1.46 (*)     ESTIMATED GFR 41 (*)     All other components within normal limits    Narrative:     Estimated Glomerular Filtration Rate (eGFR) is calculated using the CKD-EPI (2021) equation, intended for patients 30 years of age and older. If gender is not documented or "unknown", there will be no eGFR calculation.   HEPATIC FUNCTION PANEL - Abnormal; Notable for the following components:    PROTEIN TOTAL 8.5 (*)     All other components within normal limits   CBC WITH DIFF - Abnormal; Notable for the following components:    HGB 11.2 (*)     MCV 77.5 (*)     MCH 24.9 (*)     RDW-CV 18.7 (*)     PLATELETS 416 (*)     All other components within normal limits   URINALYSIS, MACROSCOPIC - Abnormal; Notable for the following components:    PROTEIN 30 (*)     BLOOD Large (*)     KETONES 5 (*)     LEUKOCYTES Trace (*)     APPEARANCE Cloudy (*)     All other components within normal limits   URINALYSIS, MICROSCOPIC - Abnormal; Notable for the following components:    WBCS 10-20 (*)     RBCS 50-100 (*)     BACTERIA Several (*)     SQUAMOUS EPITHELIAL CELLS Several (*)     All other components within normal limits   LIPASE - Normal   HCG, PLASMA OR SERUM QUANTITATIVE, PREGNANCY - Normal   COVID-19, FLU A/B, RSV RAPID BY PCR - Normal    Narrative:  Results are for the simultaneous qualitative identification of SARS-CoV-2 (formerly 2019-nCoV), Influenza A, Influenza B, and RSV RNA. These etiologic agents are generally detectable in nasopharyngeal and nasal swabs during the ACUTE PHASE of infection. Hence, this test is intended to be performed on respiratory specimens collected from individuals with signs and symptoms of upper respiratory tract infection who meet Centers for Disease Control and Prevention (CDC) clinical and/or epidemiological criteria for Coronavirus Disease 2019 (COVID-19) testing. CDC COVID-19 criteria for testing on human specimens is available at [facility] webpage information for  Healthcare Professionals: Coronavirus Disease 2019 (COVID-19) ([email or web address]).     False-negative results may occur if the virus has genomic mutations, insertions, deletions, or rearrangements or if performed very early in the course of illness. Otherwise, negative results indicate virus specific RNA targets are not detected, however negative results do not preclude SARS-CoV-2 infection/COVID-19, Influenza, or Respiratory syncytial virus infection. Results should not be used as the sole basis for patient management decisions. Negative results must be combined with clinical observations, patient history, and epidemiological information. If upper respiratory tract infection is still suspected based on exposure history together with other clinical findings, re-testing should be considered.    Disclaimer:   This assay has been authorized by FDA under an Emergency Use Authorization for use in laboratories certified under the Clinical Laboratory Improvement Amendments of 1988 (CLIA), 42 U.S.C. [phone], to perform high complexity tests. The impacts of vaccines, antiviral therapeutics, antibiotics, chemotherapeutic or immunosuppressant drugs have not been evaluated.     Test methodology:   Cepheid Xpert Xpress SARS-CoV-2/Flu/RSV Assay real-time polymerase chain reaction (RT-PCR) test on the GeneXpert Dx and Xpert Xpress systems.   URINE CULTURE,ROUTINE   CBC/DIFF    Narrative:     The following orders were created for panel order CBC/DIFF.  Procedure                               Abnormality         Status                     ---------                               -----------         ------                     CBC WITH IOXB[[id number]]                Abnormal            Final result                 Please view results for these tests on the individual orders.   URINALYSIS, MACROSCOPIC AND MICROSCOPIC W/CULTURE REFLEX    Narrative:     The following orders were created for panel order  URINALYSIS, MACROSCOPIC AND MICROSCOPIC W/CULTURE REFLEX.  Procedure                               Abnormality         Status                     ---------                               -----------         ------  URINALYSIS, MACROSCOPIC[[id number]]      Abnormal            Final result               URINALYSIS, MICROSCOPIC[[id number]]      Abnormal            Final result                 Please view results for these tests on the individual orders.       ECG:  Normal sinus rhythm rate 81    Plan: Appropriate labs and imaging ordered. Medical Records reviewed.    Therapy/Procedures/Course/MDM:   . Patient was vitally stable throughout visit.   . Imaging remarkable for: As above  . Lab results remarkable for: As above   . Patient received: See MAR   . 60 year old female complaining flu-like symptoms for the past 2 weeks consisting of nausea vomiting, headache, sneezing, stuffy nose, slight diarrhea.  History of migraines.  Denies fever  . Basic labs obtained patient given 1 L IV normal saline you 4 mg Zofran IV, viral panel obtained.  . Blood work remarkable for potassium 2.1.  EKG showed normal sinus rhythm with a rate of 81 Viral testing negative  . Patient given 40 mEq potassium p.o. given 1 L IV normal saline.  4 mg Zofran IV.  Marland Kitchen Discharge home with viral syndrome and hypokalemia information sheet given prescription for Zofran and prescription for K-Dur tablets.  To follow-up with Dr. Jearld Shines in 2-3 days.   . Results discussed with patient.  she had improvement with initial ED management. she was given the opportunity to ask questions.    Consults:    none  Impression:   Encounter Diagnoses   Name Primary?   . Acute viral syndrome Yes   . Hypokalemia      Disposition:  Discharged     Following the above history, physical exam, and studies, the patient was deemed stable and suitable for discharge.   she will follow up with PCP  in 2-3 days    kdur and zofran was prescribed.  Medication  instructions were discussed with the patient   It was advised that the patient return to the ED with any new, concerning or worsening symptoms and follow up as directed.    The patient verbalized understanding of all instructions and had no further questions or concerns.     The supervising physician was physically present and available for consultation, and did not physically see the patient.    Felix Pacini, PA-C  05/28/2021, [phone]

## 2021-05-28 NOTE — ED Nurses Note (Signed)
Pt discharged. Pt given discharge instructions with escripts. Pt tol well, no complaints. Pt d.c ambulatory. Lilyan Gilford, RN

## 2021-05-30 LAB — URINE CULTURE,ROUTINE: URINE CULTURE: <10000

## 2021-06-06 ENCOUNTER — Inpatient Hospital Stay
Admission: EM | Admit: 2021-06-06 | Discharge: 2021-06-11 | DRG: 640 | Disposition: A | Discharge status: Home / Self Care | Payer: Commercial Managed Care - PPO | Attending: Internal Medicine | Admitting: Internal Medicine

## 2021-06-06 ENCOUNTER — Inpatient Hospital Stay (HOSPITAL_COMMUNITY): Payer: Commercial Managed Care - PPO

## 2021-06-06 ENCOUNTER — Other Ambulatory Visit: Payer: Self-pay

## 2021-06-06 ENCOUNTER — Emergency Department (EMERGENCY_DEPARTMENT_HOSPITAL): Payer: Commercial Managed Care - PPO | Admitting: Radiology

## 2021-06-06 ENCOUNTER — Emergency Department (EMERGENCY_DEPARTMENT_HOSPITAL): Payer: Commercial Managed Care - PPO

## 2021-06-06 ENCOUNTER — Encounter (HOSPITAL_COMMUNITY): Payer: Self-pay | Admitting: Student in an Organized Health Care Education/Training Program

## 2021-06-06 DIAGNOSIS — E876 Hypokalemia: Principal | ICD-10-CM | POA: Diagnosis present

## 2021-06-06 DIAGNOSIS — N170 Acute kidney failure with tubular necrosis: Secondary | ICD-10-CM | POA: Diagnosis present

## 2021-06-06 DIAGNOSIS — R531 Weakness: Secondary | ICD-10-CM

## 2021-06-06 DIAGNOSIS — E8729 Other acidosis: Secondary | ICD-10-CM | POA: Diagnosis present

## 2021-06-06 DIAGNOSIS — R319 Hematuria, unspecified: Secondary | ICD-10-CM | POA: Diagnosis present

## 2021-06-06 DIAGNOSIS — R778 Other specified abnormalities of plasma proteins: Secondary | ICD-10-CM

## 2021-06-06 DIAGNOSIS — E7220 Disorder of urea cycle metabolism, unspecified: Secondary | ICD-10-CM

## 2021-06-06 DIAGNOSIS — N179 Acute kidney failure, unspecified: Secondary | ICD-10-CM | POA: Diagnosis present

## 2021-06-06 DIAGNOSIS — N858 Other specified noninflammatory disorders of uterus: Secondary | ICD-10-CM

## 2021-06-06 DIAGNOSIS — R112 Nausea with vomiting, unspecified: Secondary | ICD-10-CM

## 2021-06-06 DIAGNOSIS — E874 Mixed disorder of acid-base balance: Secondary | ICD-10-CM | POA: Diagnosis present

## 2021-06-06 DIAGNOSIS — R932 Abnormal findings on diagnostic imaging of liver and biliary tract: Secondary | ICD-10-CM

## 2021-06-06 DIAGNOSIS — N2889 Other specified disorders of kidney and ureter: Secondary | ICD-10-CM

## 2021-06-06 DIAGNOSIS — I10 Essential (primary) hypertension: Secondary | ICD-10-CM | POA: Diagnosis present

## 2021-06-06 DIAGNOSIS — K7689 Other specified diseases of liver: Secondary | ICD-10-CM

## 2021-06-06 DIAGNOSIS — Z8719 Personal history of other diseases of the digestive system: Secondary | ICD-10-CM

## 2021-06-06 DIAGNOSIS — A0472 Enterocolitis due to Clostridium difficile, not specified as recurrent: Secondary | ICD-10-CM | POA: Diagnosis present

## 2021-06-06 DIAGNOSIS — G40909 Epilepsy, unspecified, not intractable, without status epilepticus: Secondary | ICD-10-CM | POA: Diagnosis present

## 2021-06-06 DIAGNOSIS — E872 Acidosis, unspecified: Secondary | ICD-10-CM | POA: Diagnosis present

## 2021-06-06 DIAGNOSIS — K580 Irritable bowel syndrome with diarrhea: Secondary | ICD-10-CM

## 2021-06-06 DIAGNOSIS — E039 Hypothyroidism, unspecified: Secondary | ICD-10-CM | POA: Diagnosis present

## 2021-06-06 DIAGNOSIS — N200 Calculus of kidney: Secondary | ICD-10-CM

## 2021-06-06 DIAGNOSIS — Z87891 Personal history of nicotine dependence: Secondary | ICD-10-CM

## 2021-06-06 DIAGNOSIS — R198 Other specified symptoms and signs involving the digestive system and abdomen: Secondary | ICD-10-CM

## 2021-06-06 DIAGNOSIS — Z20822 Contact with and (suspected) exposure to covid-19: Secondary | ICD-10-CM | POA: Diagnosis present

## 2021-06-06 DIAGNOSIS — Z79890 Hormone replacement therapy (postmenopausal): Secondary | ICD-10-CM

## 2021-06-06 DIAGNOSIS — R9431 Abnormal electrocardiogram [ECG] [EKG]: Secondary | ICD-10-CM

## 2021-06-06 DIAGNOSIS — R059 Cough, unspecified: Secondary | ICD-10-CM

## 2021-06-06 DIAGNOSIS — Z79899 Other long term (current) drug therapy: Secondary | ICD-10-CM

## 2021-06-06 DIAGNOSIS — E875 Hyperkalemia: Secondary | ICD-10-CM | POA: Diagnosis present

## 2021-06-06 LAB — BASIC METABOLIC PANEL
ANION GAP: 16 mmol/L — ABNORMAL HIGH (ref 4–13)
ANION GAP: 12 mmol/L (ref 4–13)
BUN/CREA RATIO: 7 (ref 6–22)
BUN/CREA RATIO: 9 (ref 6–22)
BUN: 12 mg/dL (ref 8–25)
BUN: 12 mg/dL (ref 8–25)
CALCIUM: 13.4 mg/dL (ref 8.5–10.2)
CALCIUM: 11.9 mg/dL — ABNORMAL HIGH (ref 8.5–10.2)
CHLORIDE: 108 mmol/L (ref 96–111)
CHLORIDE: 115 mmol/L — ABNORMAL HIGH (ref 96–111)
CO2 TOTAL: 10 mmol/L — ABNORMAL LOW (ref 22–32)
CO2 TOTAL: 14 mmol/L — ABNORMAL LOW (ref 22–32)
CREATININE: 1.73 mg/dL — ABNORMAL HIGH (ref 0.49–1.10)
CREATININE: 1.34 mg/dL — ABNORMAL HIGH (ref 0.49–1.10)
ESTIMATED GFR: 34 mL/min/{1.73_m2} — ABNORMAL LOW (ref >60)
ESTIMATED GFR: 46 mL/min/{1.73_m2} — ABNORMAL LOW (ref >60)
GLUCOSE: 187 mg/dL — ABNORMAL HIGH (ref 65–125)
GLUCOSE: 121 mg/dL (ref 65–125)
POTASSIUM: 3.6 mmol/L (ref 3.5–5.1)
POTASSIUM: 1.2 mmol/L — CL (ref 3.5–5.1)
SODIUM: 134 mmol/L — ABNORMAL LOW (ref 136–145)
SODIUM: 141 mmol/L (ref 136–145)

## 2021-06-06 LAB — CBC WITH DIFF
BASOPHIL #: <0.04 10*3/uL (ref <=0.20)
BASOPHIL %: 0 %
EOSINOPHIL #: <0.04 10*3/uL (ref <=0.50)
EOSINOPHIL %: 0 %
HCT: 50.4 % — ABNORMAL HIGH (ref 34.8–46.0)
HCT: 41 % (ref 34.8–46.0)
HGB: 16.5 g/dL — ABNORMAL HIGH (ref 11.5–16.0)
HGB: 13.3 g/dL (ref 11.5–16.0)
IMMATURE GRANULOCYTE #: 0.1 10*3/uL — ABNORMAL HIGH (ref <0.10)
IMMATURE GRANULOCYTE %: 0 % (ref 0–1)
LYMPHOCYTE #: 4.03 10*3/uL (ref 1.00–4.80)
LYMPHOCYTE %: 16 %
MCH: 24.3 pg — ABNORMAL LOW (ref 26.0–32.0)
MCH: 24.4 pg — ABNORMAL LOW (ref 26.0–32.0)
MCHC: 32.7 g/dL (ref 31.0–35.5)
MCHC: 32.4 g/dL (ref 31.0–35.5)
MCV: 74.1 fL — ABNORMAL LOW (ref 78.0–100.0)
MCV: 75.2 fL — ABNORMAL LOW (ref 78.0–100.0)
MONOCYTE #: 1.62 10*3/uL — ABNORMAL HIGH (ref 0.20–1.10)
MONOCYTE %: 7 %
MPV: 10.7 fL (ref 8.7–12.5)
MPV: 10.6 fL (ref 8.7–12.5)
NEUTROPHIL #: 18.81 10*3/uL — ABNORMAL HIGH (ref 1.50–7.70)
NEUTROPHIL %: 77 %
PLATELETS: 639 10*3/uL — ABNORMAL HIGH (ref 150–400)
PLATELETS: 459 10*3/uL — ABNORMAL HIGH (ref 150–400)
RBC: 6.8 10*6/uL — ABNORMAL HIGH (ref 3.85–5.22)
RBC: 5.45 10*6/uL — ABNORMAL HIGH (ref 3.85–5.22)
RDW-CV: 20.6 % — ABNORMAL HIGH (ref 11.5–15.5)
RDW-CV: 19.7 % — ABNORMAL HIGH (ref 11.5–15.5)
WBC: 22.9 10*3/uL — ABNORMAL HIGH
WBC: 24.6 10*3/uL — ABNORMAL HIGH

## 2021-06-06 LAB — HEPATIC FUNCTION PANEL
ALBUMIN: 4.9 g/dL (ref 3.5–5.0)
ALKALINE PHOSPHATASE: 126 U/L (ref 50–130)
ALT (SGPT): 28 U/L (ref <55)
AST (SGOT): 83 U/L — ABNORMAL HIGH (ref 8–41)
BILIRUBIN DIRECT: 0.2 mg/dL (ref <0.3)
BILIRUBIN TOTAL: 0.9 mg/dL (ref 0.3–1.3)
PROTEIN TOTAL: 10.9 g/dL — ABNORMAL HIGH (ref 6.4–8.3)

## 2021-06-06 LAB — VENOUS BLOOD GAS/LACTATE/LYTES (NA/K/CA/CL/GLUC)
%FIO2 (VENOUS): 21 %
BASE DEFICIT: 10.7 mmol/L — ABNORMAL HIGH (ref <=3.0)
BICARBONATE (VENOUS): 14.7 mmol/L — ABNORMAL LOW (ref 22.0–26.0)
CHLORIDE: 109 mmol/L (ref 101–111)
GLUCOSE: 143 mg/dL — ABNORMAL HIGH (ref 60–105)
IONIZED CALCIUM: 1.65 mmol/L (ref 1.10–1.35)
LACTATE: 2.1 mmol/L — ABNORMAL HIGH (ref 0.0–1.3)
PCO2 (VENOUS): 37 mm/Hg — ABNORMAL LOW (ref 41–51)
PH (VENOUS): 7.24 — ABNORMAL LOW (ref 7.31–7.41)
PO2 (VENOUS): 23 mm/Hg — ABNORMAL LOW (ref 35–50)
SODIUM: 139 mmol/L (ref 137–145)
WHOLE BLOOD POTASSIUM: 1.2 mmol/L — CL (ref 3.5–4.6)

## 2021-06-06 LAB — VENOUS BLOOD GAS
%FIO2 (VENOUS): 21 %
BASE DEFICIT: 11.2 mmol/L — ABNORMAL HIGH (ref <=3.0)
BICARBONATE (VENOUS): 14.7 mmol/L — ABNORMAL LOW (ref 22.0–26.0)
PCO2 (VENOUS): 33 mm/Hg — ABNORMAL LOW (ref 41–51)
PH (VENOUS): 7.26 — ABNORMAL LOW (ref 7.31–7.41)
PO2 (VENOUS): 28 mm/Hg — ABNORMAL LOW (ref 35–50)

## 2021-06-06 LAB — MANUAL DIFF AND MORPHOLOGY-SYSMEX
BASOPHIL #: <0.04 10*3/uL (ref <=0.20)
BASOPHIL %: 0 %
EOSINOPHIL #: 0.23 10*3/uL (ref <=0.50)
EOSINOPHIL %: 1 %
LYMPHOCYTE #: 6.18 10*3/uL — ABNORMAL HIGH (ref 1.00–4.80)
LYMPHOCYTE %: 27 %
MONOCYTE #: 1.37 10*3/uL — ABNORMAL HIGH (ref 0.20–1.10)
MONOCYTE %: 6 %
NEUTROPHIL #: 15.57 10*3/uL — ABNORMAL HIGH (ref 1.50–7.70)
NEUTROPHIL %: 68 %

## 2021-06-06 LAB — COVID-19, FLU A/B, RSV RAPID BY PCR
INFLUENZA VIRUS TYPE A: NOT DETECTED
INFLUENZA VIRUS TYPE B: NOT DETECTED
RESPIRATORY SYNCTIAL VIRUS (RSV): NOT DETECTED
SARS-CoV-2: NOT DETECTED

## 2021-06-06 LAB — MAGNESIUM
MAGNESIUM: 3.2 mg/dL — ABNORMAL HIGH (ref 1.6–2.6)
MAGNESIUM: 2.6 mg/dL (ref 1.6–2.6)

## 2021-06-06 LAB — URINALYSIS, MICROSCOPIC

## 2021-06-06 LAB — URINALYSIS, MACROSCOPIC
BILIRUBIN: NEGATIVE mg/dL
COLOR: NORMAL
GLUCOSE: NEGATIVE mg/dL
KETONES: NEGATIVE mg/dL
LEUKOCYTES: NEGATIVE WBCs/uL
NITRITE: NEGATIVE
PH: 7 (ref 5.0–8.0)
PROTEIN: 100 mg/dL — AB
SPECIFIC GRAVITY: 1.015 (ref 1.005–1.030)
UROBILINOGEN: NEGATIVE mg/dL

## 2021-06-06 LAB — TROPONIN-I (FOR ED ONLY): TROPONIN I: 214 ng/L (ref 0–30)

## 2021-06-06 LAB — HCG, PLASMA OR SERUM QUANTITATIVE, PREGNANCY: HCG QUANTITATIVE PREGNANCY: <1 IU/L (ref <15)

## 2021-06-06 LAB — AMMONIA: AMMONIA: 106 umol/L — ABNORMAL HIGH (ref 15–50)

## 2021-06-06 LAB — TROPONIN-I: TROPONIN I: 216 ng/L (ref 0–30)

## 2021-06-06 MED ORDER — SODIUM CHLORIDE 0.9 % IV BOLUS
1000.0000 mL | INJECTION | Status: AC
Start: 2021-06-06 — End: 2021-06-06
  Administered 2021-06-06: 13:00:00 1000 mL via INTRAVENOUS
  Administered 2021-06-06: 13:00:00 0 mL via INTRAVENOUS

## 2021-06-06 MED ORDER — LEVOTHYROXINE 75 MCG TABLET
75.0000 ug | ORAL_TABLET | Freq: Every morning | ORAL | Status: DC
Start: 2021-06-07 — End: 2021-06-11
  Administered 2021-06-07 – 2021-06-11 (×5): 75 ug via ORAL
  Filled 2021-06-06 (×5): qty 1

## 2021-06-06 MED ORDER — DEXTROSE 5% IN WATER (D5W) FLUSH BAG - 250 ML
INTRAVENOUS | Status: DC | PRN
Start: 2021-06-06 — End: 2021-06-11

## 2021-06-06 MED ORDER — POTASSIUM CHLORIDE 10 MEQ/100ML IN STERILE WATER INTRAVENOUS PIGGYBACK
10.0000 meq | INJECTION | INTRAVENOUS | Status: AC
Start: 2021-06-07 — End: 2021-06-07
  Administered 2021-06-06 – 2021-06-07 (×2): 10 meq via INTRAVENOUS
  Administered 2021-06-07 (×2): 0 meq via INTRAVENOUS
  Filled 2021-06-06 (×2): qty 100

## 2021-06-06 MED ORDER — POTASSIUM CHLORIDE ER 20 MEQ TABLET,EXTENDED RELEASE(PART/CRYST)
40.0000 meq | ORAL_TABLET | ORAL | Status: AC
Start: 2021-06-07 — End: 2021-06-06
  Administered 2021-06-06: 23:00:00 40 meq via ORAL
  Filled 2021-06-06: qty 2

## 2021-06-06 MED ORDER — ENOXAPARIN 40 MG/0.4 ML SUBCUTANEOUS SYRINGE
40.0000 mg | INJECTION | SUBCUTANEOUS | Status: DC
Start: 2021-06-06 — End: 2021-06-11
  Administered 2021-06-06 – 2021-06-10 (×5): 40 mg via SUBCUTANEOUS
  Filled 2021-06-06 (×5): qty 0.4

## 2021-06-06 MED ORDER — ONDANSETRON HCL (PF) 4 MG/2 ML INJECTION SOLUTION
4.0000 mg | Freq: Three times a day (TID) | INTRAMUSCULAR | Status: DC | PRN
Start: 2021-06-06 — End: 2021-06-11

## 2021-06-06 MED ORDER — SODIUM CHLORIDE 0.9 % INTRAVENOUS SOLUTION
INTRAVENOUS | Status: DC
Start: 2021-06-06 — End: 2021-06-07

## 2021-06-06 MED ORDER — VENLAFAXINE ER 75 MG CAPSULE,EXTENDED RELEASE 24 HR
75.0000 mg | ORAL_CAPSULE | Freq: Every day | ORAL | Status: DC
Start: 2021-06-07 — End: 2021-06-11
  Administered 2021-06-07 – 2021-06-11 (×5): 75 mg via ORAL
  Filled 2021-06-06 (×5): qty 1

## 2021-06-06 MED ORDER — SODIUM CHLORIDE 0.9 % (FLUSH) INJECTION SYRINGE
2.0000 mL | INJECTION | INTRAMUSCULAR | Status: DC | PRN
Start: 2021-06-06 — End: 2021-06-11

## 2021-06-06 MED ORDER — SODIUM CHLORIDE 0.9% FLUSH BAG - 250 ML
INTRAVENOUS | Status: DC | PRN
Start: 2021-06-06 — End: 2021-06-11
  Administered 2021-06-07: 13:00:00 40 mL via INTRAVENOUS

## 2021-06-06 MED ORDER — ONDANSETRON HCL (PF) 4 MG/2 ML INJECTION SOLUTION
4.0000 mg | INTRAMUSCULAR | Status: AC
Start: 2021-06-06 — End: 2021-06-06
  Administered 2021-06-06: 13:00:00 4 mg via INTRAVENOUS
  Filled 2021-06-06: qty 2

## 2021-06-06 MED ORDER — SODIUM CHLORIDE 0.9 % (FLUSH) INJECTION SYRINGE
2.0000 mL | INJECTION | Freq: Three times a day (TID) | INTRAMUSCULAR | Status: DC
Start: 2021-06-06 — End: 2021-06-11
  Administered 2021-06-06: 17:00:00 6 mL
  Administered 2021-06-06: 21:00:00 5 mL
  Administered 2021-06-07 (×2): 0 mL
  Administered 2021-06-07: 05:00:00 5 mL
  Administered 2021-06-08: 21:00:00 2 mL
  Administered 2021-06-08: 14:00:00 0 mL
  Administered 2021-06-08: 05:00:00 5 mL
  Administered 2021-06-09: 6 mL
  Administered 2021-06-09: 14:00:00 0 mL
  Administered 2021-06-09 – 2021-06-10 (×2): 5 mL
  Administered 2021-06-10: 14:00:00 0 mL
  Administered 2021-06-10: 21:00:00 5 mL
  Administered 2021-06-11: 06:00:00 0 mL

## 2021-06-06 MED ORDER — LEVETIRACETAM 500 MG TABLET
500.0000 mg | ORAL_TABLET | Freq: Two times a day (BID) | ORAL | Status: DC
Start: 2021-06-06 — End: 2021-06-11
  Administered 2021-06-06 – 2021-06-11 (×10): 500 mg via ORAL
  Filled 2021-06-06 (×10): qty 1

## 2021-06-06 NOTE — Nurses Notes (Signed)
Received report from ER 

## 2021-06-06 NOTE — ED Nurses Note (Signed)
Patient arrived via EMS with complaint of weakness and nausea.  Patient pale and lethargic, answer squestions appropriately.

## 2021-06-06 NOTE — ED Triage Notes (Signed)
Weakness x5 days. Hx of hypokalemia.

## 2021-06-06 NOTE — ED Provider Notes (Signed)
Department of Emergency Medicine  HPI - 06/06/2021      Resident/Advanced Practice Provider: Ileene Rubens PA-C   Attending: Dr. Scharlene Gloss    Chief Complaint:   Weakness x 5 days, hx of hypokalemia  History of Present Illness:   Teresa Ramirez, 60 y.o. female   Pt reports to the ED via EMS with c/o weakness x 5 days, hx of hypokalemia.  Seen here for flu-like symptoms/hypokalemia on 05/28/21, given RX for Zofran and K-dur.  Reports persistent n,v,d since that time with generalized weakness.  Reports body aches and headache.  Denies any fever chills.  She has an occasional cough but denies any other cold symptoms.  No abdominal pain.  No urinary complaints.  No chest pain or shortness of breath.  No heart palpitations.  No dizziness.  Denies any history of diabetes, CAD or hypertension.  Reports she was just released from hospital in [place] approximately 1 month ago for liver problems.  Review of her records shows a history of hypertension, asthma, migraines, seizure, recurrent UTIs, CVA, IBS       History Limitations: none    Review of Systems:   Constitutional: No fever, chills.  + generalized weakness. + body aches   Skin: No rashes or diaphoresis   HENT: No congestion,ear pain or sore throat.    Eyes: No vision changes or discharge   Cardio: No chest pain, palpitations, or leg swelling    Respiratory:  + cough.  No wheezing, or SOB   GI:   + nausea, vomiting and diarrhea.  No constipation, or abdominal pain   GU:  No dysuria, hematuria, or polyuria   MSK: No joint or back pain.  + body aches.     Neuro: No loss of sensation, focal deficits, or LOC.  + headache. Generalized weakness.     All other systems reviewed and are negative.    Medications:  None       Allergies:  Allergies   Allergen Reactions   . Compazine [Prochlorperazine Edisylate] Seizure   . Haldol [Haloperidol]    . Periactin [Cyproheptadine] Anaphylaxis   . Phenergan [Promethazine] Seizure   . Reglan [Metoclopramide] Seizure    . Theophylline        Past Medical History:  Past Medical History:   Diagnosis Date   . Angiomyolipoma of kidney    . Asthma    . CVA (cerebrovascular accident) (CMS [place])    . Environmental allergies    . Epilepsy (CMS [place])    . History of recurrent UTIs 01/06/2010   . HTN    . Hypothyroid    . IBS (irritable bowel syndrome)    . Insomnia    . Migraines            Past Surgical History:  Past Surgical History:   Procedure Laterality Date   . HX HAND SURGERY               Social History:  Social History     Socioeconomic History   . Marital status: Single     Spouse name: Not on file   . Number of children: Not on file   . Years of education: Not on file   . Highest education level: Not on file   Occupational History   . Not on file   Tobacco Use   . Smoking status: Former     Packs/day: 0.10     Years: 3.00     Pack  years: 0.30     Types: Cigarettes   . Smokeless tobacco: Never   . Tobacco comments:     "maybe about a pack a month, on and off for 3 years"   Substance and Sexual Activity   . Alcohol use: No   . Drug use: No     Comment: past use of marijuana in college   . Sexual activity: Yes     Partners: Male     Birth control/protection: Pill     Comment: has a boyfriend and is monogamous with him.    Other Topics Concern   . Not on file   Social History Narrative   . Not on file     Social Determinants of Health     Financial Resource Strain: Not on file   Food Insecurity: Not on file   Transportation Needs: Not on file   Physical Activity: Not on file   Stress: Not on file   Intimate Partner Violence: Not on file   Housing Stability: Not on file       Family History:  Family Medical History:     Problem Relation (Age of Onset)    Elevated Lipids Mother    Heart Attack Maternal Grandfather (69), Maternal Grandmother (70)    Hypertension (High Blood Pressure) Maternal Grandmother, Mother              Physical Exam:  All nurse's notes reviewed.  Filed Vitals:    06/06/21 1530 06/06/21 1554 06/06/21 1609 06/06/21  1900   BP: (!) 145/85 (!) 145/85 (!) 141/76 (!) 144/78   Pulse: 79 85 78 83   Resp: '17 20 19 ' (!) 21   Temp:   36.1 C (97 F) 36.3 C (97.3 F)   SpO2:  96% 99% 93%          Constitutional: vital signs as above.  NAD. A+Ox3   HENT:    Head: NC AT    Mouth/Throat: Oropharynx is clear and Dry.  No tonsillar hypertrophy, erythema or exudate   Eyes: Normal conjunctivae without discharge or redness..   Neck: Supple.  Full range of motion, no adenopathy   Cardiovascular: RRR, No murmurs, rubs, or gallops.    Pulmonary/Chest: BS equal bilaterally, good air movement. No respiratory distress. No wheezes.  + rales/crackles left anterior lung field.  , or chest tenderness.    Abdominal: BS +. Abdomen soft. No tenderness, rebound, or guarding.    GU:  No CVAT               Musculoskeletal: No obvious deformity or swelling noted. Extremities cool and mottled.     Skin:   Extremities cool and mottled.  . No rash, erythema, pallor.  .     Neurological: Alert&Ox3. Grossly intact.     Labs:  Results for orders placed or performed during the hospital encounter of 06/06/21 (from the past 24 hour(s))   BASIC METABOLIC PANEL   Result Value Ref Range    SODIUM 134 (L) 136 - 145 mmol/L    POTASSIUM 3.6 3.5 - 5.1 mmol/L    CHLORIDE 108 96 - 111 mmol/L    CO2 TOTAL 10 (L) 22 - 32 mmol/L    ANION GAP 16 (H) 4 - 13 mmol/L    CALCIUM 13.4 (HH) 8.5 - 10.2 mg/dL    GLUCOSE 187 (H) 65 - 125 mg/dL    BUN 12 8 - 25 mg/dL    CREATININE 1.73 (H) 0.49 - 1.10  mg/dL    BUN/CREA RATIO 7 6 - 22    ESTIMATED GFR 34 (L) >60 mL/min/1.23m2    Narrative    Moderately hemolyzed specimen- Result(s) may be affected.      Estimated Glomerular Filtration Rate (eGFR) is calculated using the CKD-EPI (2021) equation, intended for patients 135years of age and older. If gender is not documented or "unknown", there will be no eGFR calculation.   CBC/DIFF    Narrative    The following orders were created for panel order CBC/DIFF.  Procedure                                Abnormality         Status                     ---------                               -----------         ------                     CBC WITH DEVOJ[[id number]]               Abnormal            Final result               PATH CXHBZJIR[[id number]]                                    In process                 MANUAL DIFF AND MORPHOLO..Marland KitchenMarland Kitchen[[id number]] Abnormal            Final result                 Please view results for these tests on the individual orders.   HEPATIC FUNCTION PANEL   Result Value Ref Range    ALBUMIN 4.9 3.5 - 5.0 g/dL    ALKALINE PHOSPHATASE 126 50 - 130 U/L    ALT (SGPT) 28 <55 U/L    AST (SGOT) 83 (H) 8 - 41 U/L    BILIRUBIN TOTAL 0.9 0.3 - 1.3 mg/dL    BILIRUBIN DIRECT 0.2 <0.3 mg/dL    PROTEIN TOTAL 10.9 (H) 6.4 - 8.3 g/dL    Narrative    Moderately hemolyzed specimen- Result(s) may be affected.       MAGNESIUM   Result Value Ref Range    MAGNESIUM 3.2 (H) 1.6 - 2.6 mg/dL    Narrative    Moderately hemolyzed specimen- Result(s) may be affected.       CBC WITH DIFF   Result Value Ref Range    WBC 22.9 (H)   x10^3/uL    RBC 6.80 (H) 3.85 - 5.22 x10^6/uL    HGB 16.5 (H) 11.5 - 16.0 g/dL    HCT 50.4 (H) 34.8 - 46.0 %    MCV 74.1 (L) 78.0 - 100.0 fL    MCH 24.3 (L) 26.0 - 32.0 pg    MCHC 32.7 31.0 - 35.5 g/dL    RDW-CV 20.6 (H) 11.5 - 15.5 %    PLATELETS 639 (H) 150 - 400 x10^3/uL    MPV 10.7 8.7 -  12.5 fL   URINALYSIS, MACROSCOPIC AND MICROSCOPIC W/CULTURE REFLEX    Specimen: Urine, Clean Catch    Narrative    The following orders were created for panel order URINALYSIS, MACROSCOPIC AND MICROSCOPIC W/CULTURE REFLEX.  Procedure                               Abnormality         Status                     ---------                               -----------         ------                     URINALYSIS, MACROSCOPIC[[id number]]      Abnormal            Final result               URINALYSIS, MICROSCOPIC[[id number]]      Abnormal            Final result                 Please view results for these  tests on the individual orders.   URINALYSIS, MACROSCOPIC   Result Value Ref Range    SPECIFIC GRAVITY 1.015 1.005 - 1.030    GLUCOSE Negative Negative, 30  mg/dL    PROTEIN 100 (A) Negative, 10  mg/dL    BILIRUBIN Negative Negative mg/dL    UROBILINOGEN Negative Negative mg/dL    PH 7.0 5.0 - 8.0    BLOOD Moderate (A) Negative mg/dL    KETONES Negative Negative mg/dL    NITRITE Negative Negative    LEUKOCYTES Negative Negative WBCs/uL    APPEARANCE Clear Clear    COLOR Normal (Yellow) Normal (Yellow)    Narrative    Test did not meet guideline to perform Urine Culture.   URINALYSIS, MICROSCOPIC   Result Value Ref Range    WBCS 2-5 2-5, 5-10, 0-2, None /hpf    RBCS 5-10 (A) 2-5, 0-2, None /hpf    BACTERIA Occasional or less Occasional or less /hpf    SQUAMOUS EPITHELIAL CELLS Occasional or less Occasional or less /lpf   TROPONIN-I (FOR ED ONLY)   Result Value Ref Range    TROPONIN I 214 (HH) 0 - 30 ng/L    Narrative    Moderately hemolyzed specimen- Result(s) may be affected.      HCG, PLASMA OR SERUM QUANTITATIVE, PREGNANCY   Result Value Ref Range    HCG QUANTITATIVE PREGNANCY <1 <15 IU/L   COVID-19, FLU A/B, RSV RAPID BY PCR   Result Value Ref Range    SARS-CoV-2 Not Detected Not Detected    INFLUENZA VIRUS TYPE A Not Detected Not Detected    INFLUENZA VIRUS TYPE B Not Detected Not Detected    RESPIRATORY SYNCTIAL VIRUS (RSV) Not Detected Not Detected    Narrative    Results are for the simultaneous qualitative identification of SARS-CoV-2 (formerly 2019-nCoV), Influenza A, Influenza B, and RSV RNA. These etiologic agents are generally detectable in nasopharyngeal and nasal swabs during the ACUTE PHASE of infection. Hence, this test is intended to be performed on respiratory specimens collected from individuals with signs and symptoms of upper respiratory tract infection  who meet Centers for Disease Control and Prevention (CDC) clinical and/or epidemiological criteria for Coronavirus Disease 2019 (COVID-19)  testing. CDC COVID-19 criteria for testing on human specimens is available at [facility] webpage information for Healthcare Professionals: Coronavirus Disease 2019 (COVID-19) ([email or web address]).     False-negative results may occur if the virus has genomic mutations, insertions, deletions, or rearrangements or if performed very early in the course of illness. Otherwise, negative results indicate virus specific RNA targets are not detected, however negative results do not preclude SARS-CoV-2 infection/COVID-19, Influenza, or Respiratory syncytial virus infection. Results should not be used as the sole basis for patient management decisions. Negative results must be combined with clinical observations, patient history, and epidemiological information. If upper respiratory tract infection is still suspected based on exposure history together with other clinical findings, re-testing should be considered.    Disclaimer:   This assay has been authorized by FDA under an Emergency Use Authorization for use in laboratories certified under the Clinical Laboratory Improvement Amendments of 1988 (CLIA), 42 U.S.C. [phone], to perform high complexity tests. The impacts of vaccines, antiviral therapeutics, antibiotics, chemotherapeutic or immunosuppressant drugs have not been evaluated.     Test methodology:   Cepheid Xpert Xpress SARS-CoV-2/Flu/RSV Assay real-time polymerase chain reaction (RT-PCR) test on the GeneXpert Dx and Xpert Xpress systems.   AMMONIA   Result Value Ref Range    AMMONIA 106 (H) 15 - 50 umol/L   MANUAL DIFF AND MORPHOLOGY-SYSMEX   Result Value Ref Range    NEUTROPHIL % 68 %    LYMPHOCYTE %  27 %    MONOCYTE % 6 %    EOSINOPHIL % 1 %    BASOPHIL % 0 %    NEUTROPHIL # 15.57 (H) 1.50 - 7.70 x10^3/uL    LYMPHOCYTE # 6.18 (H) 1.00 - 4.80 x10^3/uL    MONOCYTE # 1.37 (H) 0.20 - 1.10 x10^3/uL    EOSINOPHIL # 0.23 <=0.50 x10^3/uL    BASOPHIL # <0.04 <=0.20 x10^3/uL    LARGE PLATELETS  Present (A) None   VENOUS BLOOD GAS/LACTATE/LYTES (NA/K/CA/CL/GLUC)   Result Value Ref Range    PH (VENOUS) 7.24 (L) 7.31 - 7.41    PCO2 (VENOUS) 37 (L) 41 - 51 mm/Hg    PO2 (VENOUS) 23 (L) 35 - 50 mm/Hg    BASE DEFICIT 10.7 (H) -3.0 - 3.0 mmol/L    BICARBONATE (VENOUS) 14.7 (L) 22.0 - 26.0 mmol/L    SODIUM 139 137 - 145 mmol/L    WHOLE BLOOD POTASSIUM 1.2 (LL) 3.5 - 4.6 mmol/L    CHLORIDE 109 101 - 111 mmol/L    IONIZED CALCIUM 1.65 (HH) 1.10 - 1.35 mmol/L    GLUCOSE 143 (H) 60 - 105 mg/dL    LACTATE 2.1 (H) 0.0 - 1.3 mmol/L    %FIO2 (VENOUS) 21.0 %   TROPONIN-I   Result Value Ref Range    TROPONIN I 216 (HH) 0 - 30 ng/L   CBC/DIFF    Narrative    The following orders were created for panel order CBC/DIFF.  Procedure                               Abnormality         Status                     ---------                               -----------         ------  CBC WITH FRTM[[id number]]                                                                 Please view results for these tests on the individual orders.       Imaging:      Results for orders placed or performed during the hospital encounter of 06/06/21 (from the past 72 hour(s))   XR AP MOBILE CHEST     Status: None    Narrative    Lilou A Throgmorton  Female, 60 years old.    XR AP MOBILE CHEST performed on 06/06/2021 1:20 PM.    REASON FOR EXAM:  cough. generalized weakness.    TECHNIQUE: 1 view/1 image(s) submitted for interpretation.    COMPARISON: 11/20/2011    FINDINGS: No evidence of focal consolidation, pleural effusion, or pneumothorax. The size of the heart and pulmonary vasculature are within normal limits. No acute osseous abnormalities are identified.      Impression    No acute cardiopulmonary abnormality.       CT ABDOMEN PELVIS WO IV CONTRAST     Status: None    Narrative    Iridiana A Jarriel  Female, 60 years old.    CT ABDOMEN PELVIS WO IV CONTRAST performed on 06/06/2021 1:59 PM. Examination is limited without intravenous contrast  administration.    REASON FOR EXAM:  nausea, vomiting, diarrhea, elevated WBC,    COMPARISON:   02/23/2011     FINDINGS: Limited evaluation of the lung bases reveals no acute abnormalities or concerning nodules .    The liver demonstrates no masses, acute abnormalities or bile duct dilation.  The gallbladder , spleen, pancreas, adrenals, ureters and urinary bladder are unremarkable. The right kidney contains an unchanged benign fat-containing angiomyolipoma as well as a nonobstructing intrarenal small calculus . An additional smaller unchanged cortical angiomyolipoma involves the cortex of the left kidney. No masses or acute abnormalities of the gastrointestinal tract are identified. Colon contains radiodense material of unknown etiology (recent contrast gastrointestinal study?). The transverse and descending and sigmoid colon show lack of haustral markings (" pipestem colon ") often seen with chronic colitis of various etiology or laxative abuse. No lymphadenopathy , free fluid or free air is seen. Uterus contains a small fibroid with amorphous calcifications, unchanged.    Evaluation of the osseous structures reveals no acute abnormalities or concerning lytic or sclerotic lesions.      Impression    No acute intra-abdominal process (see above)       [place] RT UPPER QUADRANT     Status: None    Narrative    Tashe A Tobey  Female, 60 years old.    [place] RT UPPER QUADRANT performed on 06/06/2021 7:15 PM.    REASON FOR EXAM:  Hyperammonemia, history of liver disease    FINDINGS: Pancreas is obscured by bowel gas. The liver has a heterogeneous echotexture with areas difficult to penetrate. Overall size is within normal limits. There is an echogenic lesion in the right hepatic lobe measuring 8 x 6 mm. Second in the right hepatic lobe measures 7 x 5 mm. No intrahepatic biliary dilatation. Common bile duct is minimally prominent in size measuring 7 mm. Right kidney is normal in size and appearance measuring 10.1 cm  in length.  No right-sided hydronephrosis. Vascular flow to the kidney is identified. Previous identified echogenic lesion within the midpole of the right kidney is likely seen on page 52 and appear similar in size and appearance.      Impression    1. Nonspecific heterogeneous liver echotexture.  2. Two subcentimeter echogenic liver lesions which are incompletely evaluated on this exam however may represent hemangiomas if patient has no cancer history. Interval follow-up with MRI liver protocol may be warranted  3. Minimal biliary dilatation with the gallbladder unremarkable in appearance.  4. 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           Orders Placed This Encounter   . XR AP MOBILE CHEST   . CT ABDOMEN PELVIS WO IV CONTRAST   . [place] RT UPPER QUADRANT   . CANCELED: US LIVER   . BASIC METABOLIC PANEL   . CBC/DIFF   . HEPATIC FUNCTION PANEL   . MAGNESIUM   . CBC WITH DIFF   . URINALYSIS, MACROSCOPIC AND MICROSCOPIC W/CULTURE REFLEX   . URINALYSIS, MACROSCOPIC   . URINALYSIS, MICROSCOPIC   . TROPONIN-I (FOR ED ONLY)   . HCG, PLASMA OR SERUM QUANTITATIVE, PREGNANCY   . COVID-19, FLU A/B, RSV RAPID BY PCR   . AMMONIA   . MANUAL DIFF AND MORPHOLOGY-SYSMEX   . PATH COMMENT   . VENOUS BLOOD GAS/LACTATE/LYTES (NA/K/CA/CL/GLUC)   . CANCELED: TROPONIN-I   . SODIUM, RANDOM URINE   . CREATININE URINE, RANDOM   . POTASSIUM,RANDOM URINE   . CHLORIDE,RANDOM URINE   . CBC/DIFF   . BASIC METABOLIC PANEL   . MAGNESIUM   . CBC/DIFF   . BASIC METABOLIC PANEL   . MAGNESIUM   . VENOUS BLOOD GAS   . CBC WITH DIFF   . CANCELED: PARATHYROID HORMONE (PTH)   . PARATHYROID HORMONE (PTH)   . HEPATIC FUNCTION PANEL   . AMMONIA   . OT EVALUATE AND TREAT (INPATIENT ONLY)   . PT EVALUATE AND TREAT (INPATIENT ONLY)   . ECG 12 LEAD   . ECG 12 LEAD   . INSERT & MAINTAIN PERIPHERAL IV ACCESS   . INSERT & MAINTAIN PERIPHERAL IV ACCESS   . PERIPHERAL IV DRESSING CHANGE   . PATIENT CLASS/LEVEL OF CARE DESIGNATION -  [place]   . NS bolus infusion 1,000 mL   . ondansetron (ZOFRAN) 2 mg/mL injection   . NS premix infusion   . NS flush syringe   . NS flush syringe   . NS 250 mL flush bag   . D5W 250 mL flush bag   . enoxaparin PF (LOVENOX) 40 mg/0.4 mL SubQ injection   . ondansetron (ZOFRAN) 2 mg/mL injection   . levETIRAcetam (KEPPRA) tablet   . venlafaxine (EFFEXOR XR) 24 hr extended release capsule   . levothyroxine (SYNTHROID) tablet       Abnormal Lab results:  Labs Reviewed   BASIC METABOLIC PANEL - Abnormal; Notable for the following components:       Result Value    SODIUM 134 (*)     CO2 TOTAL 10 (*)     ANION GAP 16 (*)     CALCIUM 13.4 (*)     GLUCOSE 187 (*)     CREATININE 1.73 (*)     ESTIMATED GFR 34 (*)     All other components within normal limits    Narrative:     Moderately hemolyzed specimen-  Result(s) may be affected.      Estimated Glomerular Filtration Rate (eGFR) is calculated using the CKD-EPI (2021) equation, intended for patients 34 years of age and older. If gender is not documented or "unknown", there will be no eGFR calculation.   HEPATIC FUNCTION PANEL - Abnormal; Notable for the following components:    AST (SGOT) 83 (*)     PROTEIN TOTAL 10.9 (*)     All other components within normal limits    Narrative:     Moderately hemolyzed specimen- Result(s) may be affected.       MAGNESIUM - Abnormal; Notable for the following components:    MAGNESIUM 3.2 (*)     All other components within normal limits    Narrative:     Moderately hemolyzed specimen- Result(s) may be affected.       CBC WITH DIFF - Abnormal; Notable for the following components:    WBC 22.9 (*)     RBC 6.80 (*)     HGB 16.5 (*)     HCT 50.4 (*)     MCV 74.1 (*)     MCH 24.3 (*)     RDW-CV 20.6 (*)     PLATELETS 639 (*)     All other components within normal limits   URINALYSIS, MACROSCOPIC - Abnormal; Notable for the following components:    PROTEIN 100 (*)     BLOOD Moderate (*)     All other components within normal limits     Narrative:     Test did not meet guideline to perform Urine Culture.   URINALYSIS, MICROSCOPIC - Abnormal; Notable for the following components:    RBCS 5-10 (*)     All other components within normal limits   TROPONIN-I (FOR ED ONLY) - Abnormal; Notable for the following components:    TROPONIN I 214 (*)     All other components within normal limits    Narrative:     Moderately hemolyzed specimen- Result(s) may be affected.      AMMONIA - Abnormal; Notable for the following components:    AMMONIA 106 (*)     All other components within normal limits   MANUAL DIFF AND MORPHOLOGY-SYSMEX - Abnormal; Notable for the following components:    NEUTROPHIL # 15.57 (*)     LYMPHOCYTE # 6.18 (*)     MONOCYTE # 1.37 (*)     LARGE PLATELETS Present (*)     All other components within normal limits   VENOUS BLOOD GAS/LACTATE/LYTES (NA/K/CA/CL/GLUC) - Abnormal; Notable for the following components:    PH (VENOUS) 7.24 (*)     PCO2 (VENOUS) 37 (*)     PO2 (VENOUS) 23 (*)     BASE DEFICIT 10.7 (*)     BICARBONATE (VENOUS) 14.7 (*)     WHOLE BLOOD POTASSIUM 1.2 (*)     IONIZED CALCIUM 1.65 (*)     GLUCOSE 143 (*)     LACTATE 2.1 (*)     All other components within normal limits   HCG, PLASMA OR SERUM QUANTITATIVE, PREGNANCY - Normal   COVID-19, FLU A/B, RSV RAPID BY PCR - Normal    Narrative:     Results are for the simultaneous qualitative identification of SARS-CoV-2 (formerly 2019-nCoV), Influenza A, Influenza B, and RSV RNA. These etiologic agents are generally detectable in nasopharyngeal and nasal swabs during the ACUTE PHASE of infection. Hence, this test is intended to be performed on respiratory specimens collected from individuals with  signs and symptoms of upper respiratory tract infection who meet Centers for Disease Control and Prevention (CDC) clinical and/or epidemiological criteria for Coronavirus Disease 2019 (COVID-19) testing. CDC COVID-19 criteria for testing on human specimens is available at [facility] webpage  information for Healthcare Professionals: Coronavirus Disease 2019 (COVID-19) ([email or web address]).     False-negative results may occur if the virus has genomic mutations, insertions, deletions, or rearrangements or if performed very early in the course of illness. Otherwise, negative results indicate virus specific RNA targets are not detected, however negative results do not preclude SARS-CoV-2 infection/COVID-19, Influenza, or Respiratory syncytial virus infection. Results should not be used as the sole basis for patient management decisions. Negative results must be combined with clinical observations, patient history, and epidemiological information. If upper respiratory tract infection is still suspected based on exposure history together with other clinical findings, re-testing should be considered.    Disclaimer:   This assay has been authorized by FDA under an Emergency Use Authorization for use in laboratories certified under the Clinical Laboratory Improvement Amendments of 1988 (CLIA), 42 U.S.C. 2197246847, to perform high complexity tests. The impacts of vaccines, antiviral therapeutics, antibiotics, chemotherapeutic or immunosuppressant drugs have not been evaluated.     Test methodology:   Cepheid Xpert Xpress SARS-CoV-2/Flu/RSV Assay real-time polymerase chain reaction (RT-PCR) test on the GeneXpert Dx and Xpert Xpress systems.   URINALYSIS, MACROSCOPIC AND MICROSCOPIC W/CULTURE REFLEX    Narrative:     The following orders were created for panel order URINALYSIS, MACROSCOPIC AND MICROSCOPIC W/CULTURE REFLEX.  Procedure                               Abnormality         Status                     ---------                               -----------         ------                     URINALYSIS, MACROSCOPIC[[id number]]      Abnormal            Final result               URINALYSIS, MICROSCOPIC[[id number]]      Abnormal            Final result                 Please view  results for these tests on the individual orders.   CBC/DIFF    Narrative:     The following orders were created for panel order CBC/DIFF.  Procedure                               Abnormality         Status                     ---------                               -----------         ------  CBC WITH ZTIW[[id number]]                Abnormal            Final result               PATH [id number]]                                     In process                 MANUAL DIFF AND MORPHOLO.Marland KitchenMarland Kitchen[[id number]]  Abnormal            Final result                 Please view results for these tests on the individual orders.   PATH COMMENT   SODIUM, RANDOM URINE   CREATININE URINE, RANDOM   POTASSIUM,RANDOM URINE   CHLORIDE,RANDOM URINE       ECG:  No ischemic changes concerning for MI, No arrhythmia    Plan: Appropriate labs and imaging ordered. Medical Records reviewed.    Therapy/Procedures/Course/MDM:   . Patient was vitally stable throughout visit.   . Imaging remarkable for: As above  . Lab results remarkable for: As above   . Patient received: See MAR   . 60 year old female complaining of nausea vomiting diarrhea generalized weakness.  Seen here on October 20th diagnosed with viral syndrome and hypokalemia.  Vomiting persist with diarrhea and weakness  . Rule out dehydration versus electrolyte abnormality  . Blood work significant for white blood cell count 22.9, calcium 13.4, creatinine 1.73, anion gap 16, troponin 214, magnesium 3.2, ammonia 106.  Viral testing negative.  Chest x-ray negative.  CT abdomen pelvis negative for acute process.  EKG shows a normal sinus rhythm rate of 80 formal of nonspecific ST//T and Twave changes.  .   . Patient given IV fluids.  She will be admitted for further evaluation for her multiple electrolyte abnormalities and trending of her troponin, IV hydration.  . Results discussed with patient.  she had improvement with initial ED management. she was given the opportunity  to ask questions.    Consults:    Dr Ashley Murrain, consulted, he felt patient would be better served at [facility] due to extensive recent history related to liver problems.  Discussed case in detal with Dr. Elta Guadeloupe, she felt patient could stay here in [place].  They discussed the case between themselves and they decided to keep patient here at [facility],  Dr. Allene Dillon is the accepting.   Impression:   Encounter Diagnoses   Name Primary?   . Hypercalcemia Yes   . Hypermagnesemia    . Metabolic acidosis    . AKI (acute kidney injury) (CMS HCC)    . Nausea vomiting and diarrhea      Disposition:  Admitted     [facility] evaluation and management.           The supervising physician was physically present and available for consultation, and did not physically see the patient.    Felix Pacini, PA-C  06/06/2021, 15:34

## 2021-06-06 NOTE — H&P (Signed)
[facility]  General Medicine  Admission H&P    Date of Service:  06/06/2021  [name], 60 y.o. female  Date of Admission:  06/06/2021  Date of Birth:  1961/07/07  PCP: Mathews Robinsons, DO  MRN: [id number]  Code Status:  Full Code          Information Obtained from: patient and history reviewed via medical record  Chief Complaint:  N/V/D    HPI: Teresa Ramirez is a female aged 43 y.o. who presents with N/V/D.  Patient has h/o epilepsy, hypothyroidism; also h/o acute liver pathology for which she was evaluated in [place]; she does not recall Dx or treatment, but her liver disease apparently improved.    Patient was seen here 10/20 for N/V & poor PO intake x 3 days following 2 weeks of headache & URI Sx.  She was noted to have hypokalemia, given Rx for ondansetron & potassium supplement.  Since then she continues to have N/V/D & poor PO intake.    PAST MEDICAL:    Past Medical History:   Diagnosis Date   . Angiomyolipoma of kidney    . Asthma    . CVA (cerebrovascular accident) (CMS [place])    . Environmental allergies    . Epilepsy (CMS [place])    . History of recurrent UTIs 01/06/2010   . HTN    . Hypothyroid    . IBS (irritable bowel syndrome)    . Insomnia    . Migraines     Past Surgical History:   Procedure Laterality Date   . HX HAND SURGERY              Medications Prior to Admission     Prescriptions    levETIRAcetam (KEPPRA) 500 mg Oral Tablet    Take 1 Tablet (500 mg total) by mouth Twice daily    levothyroxine (SYNTHROID) 75 mcg Oral Tablet    Norethin-Eth Estrad Triphasic (NORTREL 7/7/7, 28,) 0.5/0.75/1 mg- 35 mcg Oral Tablet    1 po daily    ondansetron (ZOFRAN ODT) 4 mg Oral Tablet, Rapid Dissolve    Take 1 Tablet (4 mg total) by mouth Every 8 hours as needed for Nausea/Vomiting    potassium chloride (K-DUR) 10 mEq Oral Tab Sust.Rel. Particle/Crystal    Take 1 Tablet (10 mEq total) by mouth Once a day    venlafaxine (EFFEXOR XR) 75 mg Oral Capsule, Sust. Release 24 hr    TAKE 1 CAPSULE  BY MOUTH EVERY DAY        Allergies   Allergen Reactions   . Compazine [Prochlorperazine Edisylate] Seizure   . Haldol [Haloperidol]    . Periactin [Cyproheptadine] Anaphylaxis   . Phenergan [Promethazine] Seizure   . Reglan [Metoclopramide] Seizure   . Theophylline      Family Medical History:     Problem Relation (Age of Onset)    Elevated Lipids Mother    Heart Attack Maternal Grandfather (70), Maternal Grandmother (70)    Hypertension (High Blood Pressure) Maternal Grandmother, Mother        Social History     Tobacco Use   . Smoking status: Former     Packs/day: 0.10     Years: 3.00     Pack years: 0.30     Types: Cigarettes   . Smokeless tobacco: Never   . Tobacco comments:     "maybe about a pack a month, on and off for 3 years"   Substance Use  Topics   . Alcohol use: No      ROS:   Constitutional: positive for fatigue  Eyes: negative for visual disturbance  Ears, nose, mouth, throat, and face: positive for dry mouth  Respiratory: negative for cough  Cardiovascular: negative for chest pain  Gastrointestinal: positive for nausea, vomiting and diarrhea, negative for abdominal pain  Genitourinary:negative for dysuria  Integument/breast: negative for rash  Neurological: positive for weakness  Behavioral/Psych: negative for tobacco use      Physical Examination:  Temperature: 36.1 C (97 F) Heart Rate: 78 BP (Non-Invasive): (!) 141/76   Respiratory Rate: 19 SpO2: 99 %       General: appears listed age; ill & uncomfortable  Eyes: Sclera non-icteric.   HENT:Mouth mucous membranes dry.   Neck: supple, symmetrical, trachea midline  Lungs: Clear to auscultation bilaterally.   Cardiovascular: regular rate and rhythm  no murmurs  Abdomen: Soft, non-tender  Extremities: No cyanosis or edema  Skin: poor skin turgor  Neurologic: awake, alert, & appropriate    Labs:    CBC Differential   Recent Labs     06/06/21  1221   WBC 22.9*   HGB 16.5*   HCT 50.4*   PLTCNT 639*    Recent Labs     06/06/21  1221   PMNS 68   LYMPHOCYTES  27   MONOCYTES 6   EOSINOPHIL 1   BASOPHILS 0  <0.04   PMNABS 15.57*   MONOSABS 1.37*   EOSABS 0.23      BMP LFTs   Recent Labs     06/06/21  1221 06/06/21  1403   SODIUM 134* 139   POTASSIUM 3.6  --    CHLORIDE 108 109   CO2 10*  --    BUN 12  --    CREATININE 1.73*  --    GLUCOSENF 187*  --    ANIONGAP 16*  --    BUNCRRATIO 7  --    GFR 34*  --    CALCIUM 13.4*  --    MAGNESIUM 3.2*  --    ALBUMIN 4.9  --      Recent Labs     06/06/21  1221   CALCIUM 13.4*   ALBUMIN 4.9   MAGNESIUM 3.2*    Recent Labs     06/06/21  1221 06/06/21  1243 06/06/21  1257   TOTALPROTEIN 10.9*  --   --    ALBUMIN 4.9  --   --    TOTBILIRUBIN 0.9  --   --    BILIRUBINCON 0.2  --   --    UROBILINOGEN  --   --  Negative   AST 83*  --   --    ALT 28  --   --    ALKPHOS 126  --   --    AMMONIA  --  106*  --       CoAgs Blood Gas:   No results found for this encounter Recent Labs     06/06/21  1403   FI02 21.0   PH 7.24*   PCO2 37*   PO2 23*   BICARBONATE 14.7*   BASEDEFICIT 10.7*       Cardiac Markers Lipid Panel   Recent Labs     06/06/21  1243   TROPONINI 214*    No results found for this encounter   Urine Analysis Other Labs   Recent Labs     06/06/21  1257 06/06/21  1403   APPEARANCE Clear  --    COLOR Normal (Yellow)  --    SPECGRAVUR 1.015  --    GLUCOSE Negative 143*   BILIRUBIN Negative  --    KETONES Negative  --    PHURINE 7.0  --    PROTEIN 100*  --    UROBILINOGEN Negative  --    NITRITE Negative  --    LEUKOCYTES Negative  --    RBCS 5-10*  --    WBCS 2-5  --    BACTERIA Occasional or less  --     No results found for this encounter    Invalid input(s): PRL     Imaging Studies:  CT ABDOMEN PELVIS WO IV CONTRAST    Result Date: 06/06/2021  Impression No acute intra-abdominal process (see above)     XR AP MOBILE CHEST    Result Date: 06/06/2021  Impression No acute cardiopulmonary abnormality.       Assessment/Plan:   Teresa Ramirez is a female aged 73 y.o. with h/o epilepsy, hypothyroidism, & acute liver pathology of unclear  character & etiology which has evidently resolved within the last year admitted 06/06/2021 for intractable N/V/D with hypercalcemia, metabolic acidosis, AKI, hyperammonemia, & prolonged QTc.    Active Hospital Problems  Active Hospital Problems    Diagnosis   . Primary Problem: Hypercalcemia   . AKI (acute kidney injury) (CMS HCC)   . Nausea vomiting and diarrhea   . Hyperammonemia (CMS HCC)   . Metabolic acidosis, increased anion gap (IAG)   . Metabolic acidosis, normal anion gap (NAG)   . Hematuria   . Prolonged Q-T interval on ECG   . Hypothyroidism   . Epilepsy (CMS [place])     1. Hypercalcemia  Serum Ca 13.4, ionized Ca 1.65.  Unclear etiology at this time.  - saline infusion ordered at 150cc/hr; will follow serum Ca to evaluate need for further therapies (eg bisphosphonates, calcitonin) or Endocrinology consult  - PTH ordered to evaluate etiology    2. Metabolic Acidosis, with normal anion gap  Metabolic acidosis, with increased anion gap  N/V/D  AKI  Lactic Acidosis  VBG & BMP showing evidence of mixed acidosis with at least one process with increased anion gap (candidates include renal complications of AKI & lactic acidosis) & at least one process with normal anion gap (likely GI diarrheal loss of bicarbonate).  - ondansetron given in ED; will hold further ondansetron due to prolonged Qtc, follow telemetry & repeat EKG to evaluate for adding ondansetron; patient has significant allergies to alternative antiemetics  - saline as above for presumed prerenal/ATN etiology of AKI; will additional check urine studies to characterize AKI & acidosis    3. Hyperammonemia  Serum NH3 106; etiology unclear given hepatic function panel otherwise unimpressive for liver failure (notable for AST 83, total protein 10.9).  No signs of hepatic encephalopathy at this time.  - will obtain US RUQ, liver  - will follow hepatic function panel    4. Elevated Troponin  Prolonged Qtc  Troponin 214 on presentation, repeat stable at  216.  EKG with diffuse ST changes but possible related to electrolyte abnormalities; repeat EKG shows no concerning evolution.  Do not suspect ACS as patient is without chest pain or dyspnea at this time & troponin is stable.  Qtc prolonged at 571, repeat 652.  - will treat as above, no specific interventions for this troponin at this time  - continue telemetry for prolonged  Qtc    Chronic Medical Problems  Epilepsy: continue home levetiracetam  Hypothyroidism: continue home levothyroxine    Consults: none  Hardware (lines, foley's, tubes):  Patient Lines/Drains/Airways Status     Active Line / Dialysis Catheter / Dialysis Graft / Drain / Airway / Wound     Name Placement date Placement time Site Days    Peripheral IV Right Median Cubital  (antecubital fossa) 06/06/21  1220  -- less than 1              Diet: regular  Activity: up in chair BID  PT/OT: Yes  DVT/PE Prophylaxis: Enoxaparin  Disposition Planning: TBD     Neysa Bonito, MD  06/06/21 18:19  Assistant Professor, Internal Medicine  [place]    I was present at the bedside of this critically ill patient for 35 minutes exclusive of procedures. This patient suffers from failure or dysfunction of 4 system(s). The care of this patient was in regard to managing (a) conditions(s) that has a high probability of sudden, clinically significant, or life-threatening deterioration and required a high degree of Attending Physician attention and direct involvement to intervene urgently. Data review and care planning was performed in direct proximity of the patient, examination was obviously performed in direct contact with the patient. All of this time was exclusive of procedure which will be documented elsewhere in the chart.    My critical care time involved full attention to the patient's condition and included:    Review of nursing notes and/or old charts  Review of  medications, allergies, and vital signs  Documentation time  Consultant collaboration on findings and treatment options  Care, transfer of care, and discharge plans  Ordering, interpreting, and reviewing diagnostic studies/tab tests  Obtaining necessary history from family, EMS, nursing home staff and/or treating physicians    My critical care time did not include time spent teaching resident physician(s) or other services of resident physicians, or performing other reported procedures.  Total Critical Care Time: 60 minutes    Neysa Bonito, MD  06/08/21 16:00  Assistant Professor, Internal Medicine  [facility]

## 2021-06-06 NOTE — Care Plan (Signed)
Patient admitted from ER. Patient is alert and oriented x 4 and lethargic. Skin checked. PIV checked. Patient oriented to room and call light. Patient resting with eyes closed and needs questions repeated as she falls asleep during conversation.         Problem: Adult Inpatient Plan of Care  Goal: Plan of Care Review  Outcome: Ongoing (see interventions/notes)  Goal: Patient-Specific Goal (Individualized)  Outcome: Ongoing (see interventions/notes)  Flowsheets (Taken 06/06/2021 1646)  Individualized Care Needs: none  Anxieties, Fears or Concerns: none  Goal: Absence of Hospital-Acquired Illness or Injury  Outcome: Ongoing (see interventions/notes)  Intervention: Prevent Skin Injury  Recent Flowsheet Documentation  Taken 06/06/2021 1711 by Micheline Rough, RN  Skin Protection:   adhesive use limited   incontinence pads utilized  Intervention: Prevent and Manage VTE (Venous Thromboembolism) Risk  Recent Flowsheet Documentation  Taken 06/06/2021 1711 by Micheline Rough, RN  VTE Prevention/Management: anticoagulant therapy initiated  Goal: Optimal Comfort and Wellbeing  Outcome: Ongoing (see interventions/notes)  Goal: Rounds/Family Conference  Outcome: Ongoing (see interventions/notes)     Problem: Skin Injury Risk Increased  Goal: Skin Health and Integrity  Outcome: Ongoing (see interventions/notes)  Intervention: Optimize Skin Protection  Recent Flowsheet Documentation  Taken 06/06/2021 1711 by Micheline Rough, RN  Pressure Reduction Techniques: frequent weight shift encouraged  Pressure Reduction Devices: (L.M,H,VH) Pressure redistributing mattress utilized  Skin Protection:   adhesive use limited   incontinence pads utilized     Problem: Fall Injury Risk  Goal: Absence of Fall and Fall-Related Injury  Outcome: Ongoing (see interventions/notes)

## 2021-06-06 NOTE — Nurses Notes (Signed)
Potassium result from lab reported as 1.2.  APRN notified.  Orders received to redraw potassium by straight stick, and give 40 meq potassium NOW po, and 43meq IV every 1 hour.

## 2021-06-06 NOTE — ED Nurses Note (Signed)
Report called to [facility] for room 17 on 3A.

## 2021-06-07 DIAGNOSIS — D72829 Elevated white blood cell count, unspecified: Secondary | ICD-10-CM

## 2021-06-07 DIAGNOSIS — E875 Hyperkalemia: Secondary | ICD-10-CM | POA: Diagnosis present

## 2021-06-07 DIAGNOSIS — R197 Diarrhea, unspecified: Secondary | ICD-10-CM

## 2021-06-07 DIAGNOSIS — E876 Hypokalemia: Secondary | ICD-10-CM | POA: Diagnosis present

## 2021-06-07 LAB — ECG 12 LEAD
Atrial Rate: 84 {beats}/min
Atrial Rate: 78 {beats}/min
Atrial Rate: 80 {beats}/min
Calculated P Axis: 74 degrees
Calculated P Axis: 79 degrees
Calculated P Axis: 67 degrees
Calculated R Axis: -22 degrees
Calculated R Axis: -18 degrees
Calculated R Axis: 35 degrees
Calculated T Axis: 74 degrees
Calculated T Axis: 81 degrees
Calculated T Axis: -105 degrees
PR Interval: 146 ms
PR Interval: 142 ms
PR Interval: 134 ms
QRS Duration: 94 ms
QRS Duration: 96 ms
QRS Duration: 78 ms
QT Interval: 484 ms
QT Interval: 572 ms
QT Interval: 226 ms
QTC Calculation: 571 ms
QTC Calculation: 652 ms
QTC Calculation: 260 ms
Ventricular rate: 84 {beats}/min
Ventricular rate: 78 {beats}/min
Ventricular rate: 80 {beats}/min

## 2021-06-07 LAB — BASIC METABOLIC PANEL
ANION GAP: 9 mmol/L (ref 4–13)
ANION GAP: 11 mmol/L (ref 4–13)
ANION GAP: 12 mmol/L (ref 4–13)
BUN/CREA RATIO: 11 (ref 6–22)
BUN/CREA RATIO: 9 (ref 6–22)
BUN/CREA RATIO: 10 (ref 6–22)
BUN: 11 mg/dL (ref 8–25)
BUN: 11 mg/dL (ref 8–25)
BUN: 10 mg/dL (ref 8–25)
CALCIUM: 9.7 mg/dL (ref 8.5–10.2)
CALCIUM: 10.5 mg/dL — ABNORMAL HIGH (ref 8.5–10.2)
CALCIUM: 10.1 mg/dL (ref 8.5–10.2)
CHLORIDE: 122 mmol/L — ABNORMAL HIGH (ref 96–111)
CHLORIDE: 119 mmol/L — ABNORMAL HIGH (ref 96–111)
CHLORIDE: 120 mmol/L — ABNORMAL HIGH (ref 96–111)
CO2 TOTAL: 12 mmol/L — ABNORMAL LOW (ref 22–32)
CO2 TOTAL: 12 mmol/L — ABNORMAL LOW (ref 22–32)
CO2 TOTAL: 11 mmol/L — ABNORMAL LOW (ref 22–32)
CREATININE: 0.97 mg/dL (ref 0.49–1.10)
CREATININE: 1.19 mg/dL — ABNORMAL HIGH (ref 0.49–1.10)
CREATININE: 0.99 mg/dL (ref 0.49–1.10)
ESTIMATED GFR: >60 mL/min/{1.73_m2} (ref >60)
ESTIMATED GFR: 53 mL/min/{1.73_m2} — ABNORMAL LOW (ref >60)
ESTIMATED GFR: >60 mL/min/{1.73_m2} (ref >60)
GLUCOSE: 106 mg/dL (ref 65–125)
GLUCOSE: 131 mg/dL — ABNORMAL HIGH (ref 65–125)
GLUCOSE: 125 mg/dL (ref 65–125)
POTASSIUM: 1.4 mmol/L — CL (ref 3.5–5.1)
POTASSIUM: 1.5 mmol/L — CL (ref 3.5–5.1)
POTASSIUM: 2.1 mmol/L — CL (ref 3.5–5.1)
SODIUM: 143 mmol/L (ref 136–145)
SODIUM: 142 mmol/L (ref 136–145)
SODIUM: 143 mmol/L (ref 136–145)

## 2021-06-07 LAB — POTASSIUM
POTASSIUM: 1.3 mmol/L — CL (ref 3.5–5.1)
POTASSIUM: 1.5 mmol/L — CL (ref 3.5–5.1)

## 2021-06-07 LAB — CBC WITH DIFF
BASOPHIL #: <0.04 10*3/uL (ref <=0.20)
BASOPHIL #: <0.04 10*3/uL (ref <=0.20)
BASOPHIL %: 0 %
BASOPHIL %: 0 %
EOSINOPHIL #: <0.04 10*3/uL (ref <=0.50)
EOSINOPHIL #: <0.04 10*3/uL (ref <=0.50)
EOSINOPHIL %: 0 %
EOSINOPHIL %: 0 %
HCT: 31 % — ABNORMAL LOW (ref 34.8–46.0)
HCT: 35.4 % (ref 34.8–46.0)
HGB: 10.1 g/dL — ABNORMAL LOW (ref 11.5–16.0)
HGB: 11.4 g/dL — ABNORMAL LOW (ref 11.5–16.0)
IMMATURE GRANULOCYTE #: 0.08 10*3/uL (ref <0.10)
IMMATURE GRANULOCYTE #: 0.12 10*3/uL — ABNORMAL HIGH (ref <0.10)
IMMATURE GRANULOCYTE %: 0 % (ref 0–1)
IMMATURE GRANULOCYTE %: 1 % (ref 0–1)
LYMPHOCYTE #: 2.46 10*3/uL (ref 1.00–4.80)
LYMPHOCYTE #: 3.45 10*3/uL (ref 1.00–4.80)
LYMPHOCYTE %: 13 %
LYMPHOCYTE %: 14 %
MCH: 24.6 pg — ABNORMAL LOW (ref 26.0–32.0)
MCH: 24.2 pg — ABNORMAL LOW (ref 26.0–32.0)
MCHC: 32.6 g/dL (ref 31.0–35.5)
MCHC: 32.2 g/dL (ref 31.0–35.5)
MCV: 75.4 fL — ABNORMAL LOW (ref 78.0–100.0)
MCV: 75.2 fL — ABNORMAL LOW (ref 78.0–100.0)
MONOCYTE #: 1.3 10*3/uL — ABNORMAL HIGH (ref 0.20–1.10)
MONOCYTE #: 1.63 10*3/uL — ABNORMAL HIGH (ref 0.20–1.10)
MONOCYTE %: 7 %
MONOCYTE %: 6 %
MPV: 10.8 fL (ref 8.7–12.5)
MPV: 10.8 fL (ref 8.7–12.5)
NEUTROPHIL #: 15.46 10*3/uL — ABNORMAL HIGH (ref 1.50–7.70)
NEUTROPHIL #: 20.1 10*3/uL — ABNORMAL HIGH (ref 1.50–7.70)
NEUTROPHIL %: 80 %
NEUTROPHIL %: 79 %
PLATELETS: 333 10*3/uL (ref 150–400)
PLATELETS: 383 10*3/uL (ref 150–400)
RBC: 4.11 10*6/uL (ref 3.85–5.22)
RBC: 4.71 10*6/uL (ref 3.85–5.22)
RDW-CV: 19.3 % — ABNORMAL HIGH (ref 11.5–15.5)
RDW-CV: 19.5 % — ABNORMAL HIGH (ref 11.5–15.5)
WBC: 19.3 10*3/uL — ABNORMAL HIGH
WBC: 25.3 10*3/uL — ABNORMAL HIGH

## 2021-06-07 LAB — VENOUS BLOOD GAS/LACTATE/CO-OX/LYTES (NA/K/CA/CL/GLUC)
%FIO2 (VENOUS): 21 %
BASE DEFICIT: 12.1 mmol/L — ABNORMAL HIGH (ref <=3.0)
BICARBONATE (VENOUS): 14.2 mmol/L — ABNORMAL LOW (ref 22.0–26.0)
CARBOXYHEMOGLOBIN: 0.9 % (ref 0.0–2.5)
CHLORIDE: 113 mmol/L — ABNORMAL HIGH (ref 101–111)
GLUCOSE: 133 mg/dL — ABNORMAL HIGH (ref 60–105)
HEMATOCRITRT: 35 %
HEMOGLOBIN: 11.5 g/dL — ABNORMAL LOW (ref 12.0–18.0)
IONIZED CALCIUM: 1.57 mmol/L — ABNORMAL HIGH (ref 1.10–1.35)
LACTATE: 2.6 mmol/L — ABNORMAL HIGH (ref 0.0–1.3)
MET-HEMOGLOBIN: 0.4 % (ref 0.0–2.0)
O2CT: 6.8 % (ref 6.7–17.5)
OXYHEMOGLOBIN: 42.2 % (ref 40.0–80.0)
PCO2 (VENOUS): 32 mm/Hg — ABNORMAL LOW (ref 41–51)
PH (VENOUS): 7.25 — ABNORMAL LOW (ref 7.31–7.41)
PO2 (VENOUS): 31 mm/Hg — ABNORMAL LOW (ref 35–50)
SODIUM: 141 mmol/L (ref 137–145)
WHOLE BLOOD POTASSIUM: 1.4 mmol/L — CL (ref 3.5–4.6)

## 2021-06-07 LAB — CREATININE URINE, RANDOM: CREATININE RANDOM URINE: 71 mg/dL

## 2021-06-07 LAB — HEPATIC FUNCTION PANEL
ALBUMIN: 3.1 g/dL — ABNORMAL LOW (ref 3.5–5.0)
ALKALINE PHOSPHATASE: 81 U/L (ref 50–130)
ALT (SGPT): 22 U/L (ref <55)
AST (SGOT): 84 U/L — ABNORMAL HIGH (ref 8–41)
BILIRUBIN DIRECT: 0.4 mg/dL — ABNORMAL HIGH (ref <0.3)
BILIRUBIN TOTAL: 0.7 mg/dL (ref 0.3–1.3)
PROTEIN TOTAL: 6 g/dL — ABNORMAL LOW (ref 6.4–8.3)

## 2021-06-07 LAB — SODIUM, RANDOM URINE: SODIUM RANDOM URINE: 25 mmol/L

## 2021-06-07 LAB — PATH COMMENT: PATHOLOGIST INTERPRETATION: ABNORMAL — AB

## 2021-06-07 LAB — MAGNESIUM: MAGNESIUM: 2 mg/dL (ref 1.6–2.6)

## 2021-06-07 LAB — CLOSTRIDIUM DIFFICILE TOXIN DETECTION
C. DIFFICILE INTERPRETATION: POSITIVE — AB
C. DIFFICILE TOXIN GENE, PCR: POSITIVE
C. difficile toxin: POSITIVE

## 2021-06-07 LAB — PARATHYROID HORMONE (PTH): PTH: 20.8 pg/mL (ref 8.5–77.0)

## 2021-06-07 LAB — POTASSIUM,RANDOM URINE: POTASSIUM RANDOM URINE: 16 mmol/L

## 2021-06-07 LAB — CHLORIDE,RANDOM URINE: CHLORIDE RANDOM URINE: 48 mmol/L

## 2021-06-07 LAB — AMMONIA: AMMONIA: 46 umol/L (ref 15–50)

## 2021-06-07 MED ORDER — POTASSIUM CHLORIDE ER 20 MEQ TABLET,EXTENDED RELEASE(PART/CRYST)
40.0000 meq | ORAL_TABLET | ORAL | Status: AC
Start: 2021-06-07 — End: 2021-06-07
  Administered 2021-06-07: 12:00:00 40 meq via ORAL
  Filled 2021-06-07: qty 2

## 2021-06-07 MED ORDER — ACETAMINOPHEN 325 MG TABLET
650.0000 mg | ORAL_TABLET | Freq: Four times a day (QID) | ORAL | Status: DC | PRN
Start: 2021-06-07 — End: 2021-06-11
  Administered 2021-06-07 – 2021-06-10 (×4): 650 mg via ORAL
  Filled 2021-06-07 (×4): qty 2

## 2021-06-07 MED ORDER — POTASSIUM CHLORIDE 20 MEQ/L IN 0.9 % SODIUM CHLORIDE INTRAVENOUS
INTRAVENOUS | Status: DC
Start: 2021-06-07 — End: 2021-06-11
  Administered 2021-06-11: 0 mL/h via INTRAVENOUS

## 2021-06-07 MED ORDER — POTASSIUM CHLORIDE 10 MEQ/100ML IN STERILE WATER INTRAVENOUS PIGGYBACK
10.0000 meq | INJECTION | INTRAVENOUS | Status: AC
Start: 2021-06-07 — End: 2021-06-07
  Administered 2021-06-07: 06:00:00 10 meq via INTRAVENOUS
  Administered 2021-06-07 (×2): 0 meq via INTRAVENOUS
  Administered 2021-06-07 (×2): 10 meq via INTRAVENOUS
  Administered 2021-06-07: 05:00:00 0 meq via INTRAVENOUS
  Administered 2021-06-07: 05:00:00 10 meq via INTRAVENOUS
  Administered 2021-06-07: 06:00:00 0 meq via INTRAVENOUS
  Filled 2021-06-07 (×4): qty 100

## 2021-06-07 MED ORDER — POTASSIUM CHLORIDE 10 MEQ/100ML IN STERILE WATER INTRAVENOUS PIGGYBACK
10.0000 meq | INJECTION | INTRAVENOUS | Status: AC
Start: 2021-06-07 — End: 2021-06-07
  Administered 2021-06-07 (×2): 0 meq via INTRAVENOUS
  Administered 2021-06-07 (×2): 10 meq via INTRAVENOUS
  Administered 2021-06-07: 0 meq via INTRAVENOUS
  Administered 2021-06-07 (×2): 10 meq via INTRAVENOUS
  Administered 2021-06-07: 21:00:00 0 meq via INTRAVENOUS
  Filled 2021-06-07 (×4): qty 100

## 2021-06-07 MED ORDER — POTASSIUM CHLORIDE ER 20 MEQ TABLET,EXTENDED RELEASE(PART/CRYST)
40.0000 meq | ORAL_TABLET | Freq: Two times a day (BID) | ORAL | Status: DC
Start: 2021-06-07 — End: 2021-06-07

## 2021-06-07 MED ORDER — POTASSIUM CHLORIDE 10 MEQ/100ML IN STERILE WATER INTRAVENOUS PIGGYBACK
10.0000 meq | INJECTION | INTRAVENOUS | Status: AC
Start: 2021-06-07 — End: 2021-06-07
  Administered 2021-06-07: 16:00:00 0 meq via INTRAVENOUS
  Administered 2021-06-07 (×3): 10 meq via INTRAVENOUS
  Administered 2021-06-07 (×2): 0 meq via INTRAVENOUS
  Administered 2021-06-07: 12:00:00 10 meq via INTRAVENOUS
  Administered 2021-06-07: 18:00:00 0 meq via INTRAVENOUS
  Filled 2021-06-07 (×4): qty 100

## 2021-06-07 MED ORDER — POTASSIUM CHLORIDE ER 20 MEQ TABLET,EXTENDED RELEASE(PART/CRYST)
40.0000 meq | ORAL_TABLET | Freq: Two times a day (BID) | ORAL | Status: DC
Start: 2021-06-07 — End: 2021-06-08
  Administered 2021-06-07: 17:00:00 40 meq via ORAL
  Filled 2021-06-07: qty 2

## 2021-06-07 MED ORDER — POTASSIUM CHLORIDE ER 20 MEQ TABLET,EXTENDED RELEASE(PART/CRYST)
20.0000 meq | ORAL_TABLET | ORAL | Status: AC
Start: 2021-06-07 — End: 2021-06-07
  Administered 2021-06-07: 04:00:00 20 meq via ORAL
  Filled 2021-06-07: qty 1

## 2021-06-07 NOTE — Care Plan (Signed)
Pt admitted with hypokalemia. Pt given PO potassium as well as IV. Continue to monitor K level. Pt had multiple episodes of diarrhea, Cdiff sample sent awaiting results. Pt st cath for 400 ml of clear yellow urine, pt unable to void. Incontinent care completed PRN. Pt reposition with assistance. Continue IV fluids. Continue to monitor VS and labs.   Problem: Fall Injury Risk  Goal: Absence of Fall and Fall-Related Injury  Outcome: Ongoing (see interventions/notes)  Intervention: Promote Freight forwarder Documentation  Taken 06/07/2021 1800 by Lebron Conners, RN  Safety Promotion/Fall Prevention: safety round/check completed     Problem: Adult Inpatient Plan of Care  Goal: Patient-Specific Goal (Individualized)  Outcome: Ongoing (see interventions/notes)

## 2021-06-07 NOTE — Progress Notes (Signed)
[facility]  Medicine Progress Note    Teresa Ramirez  Date of service: 06/07/2021  Date of admission: 06/06/2021   LOS: 1 day   Code status: Full Code    Subjective:  Overnight patient with frequent diarrhea, low K.  This AM continues to have diarrhea.      Objective:  I/O:  I/O last 24 hours:    Intake/Output Summary (Last 24 hours) at 06/07/2021 0838  Last data filed at 06/07/2021 0600  Gross per 24 hour   Intake 120 ml   Output 800 ml   Net -680 ml     I/O current shift:  No intake/output data recorded.    Current Medications:  D5W 250 mL flush bag, , Intravenous, Q15 Min PRN  enoxaparin PF (LOVENOX) 40 mg/0.4 mL SubQ injection, 40 mg, Subcutaneous, Q24H  levETIRAcetam (KEPPRA) tablet, 500 mg, Oral, 2x/day  levothyroxine (SYNTHROID) tablet, 75 mcg, Oral, QAM  NS 1000 mL with potassium chloride 20 mEq premix infusion, , Intravenous, Continuous  NS 250 mL flush bag, , Intravenous, Q15 Min PRN  NS flush syringe, 2-6 mL, Intracatheter, Q8HRS  NS flush syringe, 2-6 mL, Intracatheter, Q1 MIN PRN  [Held by provider] ondansetron (ZOFRAN) 2 mg/mL injection, 4 mg, Intravenous, Q8H PRN  venlafaxine (EFFEXOR XR) 24 hr extended release capsule, 75 mg, Oral, Daily    Vital Signs:  Filed Vitals:    06/06/21 1900 06/07/21 0010 06/07/21 0357 06/07/21 0736   BP: (!) 144/78 (!) 146/71 132/72 (!) 156/70   Pulse: 83 87 79 80   Resp: (!) 21 17 20 19    Temp: 36.3 C (97.3 F) 36.3 C (97.3 F) 35.9 C (96.6 F)    SpO2: 93% 100% 99% 100%     Physical Exam:  General: appears listed age, acutely & chronically ill  Eyes: Sclera non-icteric.   HENT:Mouth mucous membranes dry.   Neck: supple, symmetrical, trachea midline  Lungs: good air movement bilaterally  Cardiovascular:    regular, rate approximately 80  Abdomen: non-distended  Extremities: No cyanosis or edema  Skin: Skin warm and dry    Labs:  CBC Results Differential Results   Recent Results (from the past 30 hour(s))   CBC WITH DIFF    Collection Time: 06/07/21  5:54  AM   Result Value    WBC 19.3 (H)    HGB 10.1 (L)    HCT 31.0 (L)    PLATELETS 333    Recent Results (from the past 30 hour(s))   CBC WITH DIFF    Collection Time: 06/07/21  5:54 AM   Result Value    WBC 19.3 (H)    NEUTROPHIL % 80    MONOCYTE % 7    BASOPHIL % 0    BASOPHIL # <0.04      BMP Results Other Chemistries Results   Results for orders placed or performed during the hospital encounter of 06/06/21 (from the past 30 hour(s))   BASIC METABOLIC PANEL    Collection Time: 06/07/21  5:54 AM   Result Value    SODIUM 143    POTASSIUM 1.4 (LL)    CHLORIDE 122 (H)    CO2 TOTAL 12 (L)    GLUCOSE 106    BUN 11    CREATININE 0.97   POTASSIUM - ONCE    Collection Time: 06/07/21  2:33 AM   Result Value    POTASSIUM 1.3 (LL)    Recent Results (from the past 30 hour(s))  MAGNESIUM    Collection Time: 06/07/21  5:54 AM   Result Value    MAGNESIUM 2.0      Liver/Pancreas Enzyme Results Liver Function Results   Recent Results (from the past 30 hour(s))   HEPATIC FUNCTION PANEL    Collection Time: 06/07/21  5:54 AM   Result Value    ALKALINE PHOSPHATASE 81    ALT (SGPT) 22    AST (SGOT) 84 (H)   HEPATIC FUNCTION PANEL    Collection Time: 06/06/21 12:21 PM   Result Value    ALKALINE PHOSPHATASE 126    ALT (SGPT) 28    AST (SGOT) 83 (H)    Recent Results (from the past 30 hour(s))   AMMONIA    Collection Time: 06/07/21  5:54 AM   Result Value    AMMONIA 46   HEPATIC FUNCTION PANEL    Collection Time: 06/07/21  5:54 AM   Result Value    ALBUMIN 3.1 (L)    BILIRUBIN TOTAL 0.7    BILIRUBIN DIRECT 0.4 (H)   AMMONIA    Collection Time: 06/06/21 12:43 PM   Result Value    AMMONIA 106 (H)   HEPATIC FUNCTION PANEL    Collection Time: 06/06/21 12:21 PM   Result Value    ALBUMIN 4.9    BILIRUBIN TOTAL 0.9    BILIRUBIN DIRECT 0.2      Cardiac Results Coags Results   Results for orders placed or performed during the hospital encounter of 06/06/21 (from the past 30 hour(s))   TROPONIN-I    Collection Time: 06/06/21  4:17 PM   Result Value     TROPONIN I 216 (HH)    No results found for this or any previous visit (from the past 30 hour(s)).     Microbiology:  No results found for any visits on 06/06/21 (from the past 24 hour(s)).    Radiology:  CT ABDOMEN PELVIS WO IV CONTRAST    Result Date: 06/06/2021  Impression No acute intra-abdominal process (see above)     [place] RT UPPER QUADRANT    Result Date: 06/06/2021  Impression 1. Nonspecific heterogeneous liver echotexture. 2. Two subcentimeter echogenic liver lesions which are incompletely evaluated on this exam however may represent hemangiomas if patient has no cancer history. Interval follow-up with MRI liver protocol may be warranted 3. Minimal biliary dilatation with the gallbladder unremarkable in appearance. 4. 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     XR AP MOBILE CHEST    Result Date: 06/06/2021  Impression No acute cardiopulmonary abnormality.       Assessment:  Active Hospital Problems    Diagnosis   . Primary Problem: Hyperkalemia   . Hypercalcemia   . AKI (acute kidney injury) (CMS HCC)   . Nausea vomiting and diarrhea   . Hyperammonemia (CMS HCC)   . Metabolic acidosis, increased anion gap (IAG)   . Metabolic acidosis, normal anion gap (NAG)   . Hematuria   . Prolonged Q-T interval on ECG   . Hypothyroidism   . Epilepsy (CMS [place])     Teresa Ramirez is a female aged 60 y.o. with h/o hypothyroidism, epilepsy, & recent acute liver illness of unclear etiology who was admitted 06/06/2021 for N/V/D with severe metabolic derangements including hypercalcemia, hypokalemia, hyperammonemia, metabolic acidosis, & AKI.  Presentation also notable for prolonged QT.  Hospital course notable for improvement in some parameters, but remains persistently severely hypokalemic.  Plan:  1. Hypokalemia  N/V/D  Metabolic Acidosis  Prolonged QTc, improved  AKI, improved  Serum K 1.4 this AM from 1.2 yesterday.  HCO3 12 from 10, Cr 0.97 from 1.73.  VBG with pH 7.24 on  presentation, 7.25 today.  Blood gas & urine electrolytes consistent with multiple sources of metabolic acidosis, including at least one IAG & at least one NAG process; contributions include diarrheal loss of bicarbonate, elevated lactic acid.  QT now improved from corrected value 571 on presentation to 260 today.  - ordering additional potassium PO & IV; will follow serial BMP  - will resume ondansetron PRN now that QT is not dangerously prolonged  - continue IVF  - for diarrhea with leukocytosis, will check Cdiff study    2. Hypercalcemia, improved  Serum Ca today 9.7 from 13.4 on presentation.  PTH 20.8, most consistent with non-PTH-mediated etiology (differential including malignancy, other).  - continue saline infusion  - will check PTHRP, Vitamin D studies    3. Hyperammonemia, improved  Serum NH3 today 46 from 106 yesterday.  Etiology unclear, hepatic function panel otherwise unimpressive.  US liver with heterogenous hepatic echotexture, but otherwise not diagnostic.  - no acute interventions given improvement    Chronic Medical Conditions  Epilepsy: continue home levetiracetam  Hypothyroidism: continue home levothyroxine    Hardware (lines, foley's, tubes):  Patient Lines/Drains/Airways Status     Active Line / Dialysis Catheter / Dialysis Graft / Drain / Airway / Wound     Name Placement date Placement time Site Days    Peripheral IV Right Median Cubital  (antecubital fossa) 06/06/21  1220  -- less than 1              Consults: none  Diet: regular  Activity: up in chair BID  BM: Date of Last Bowel Movement:  (UNKNOWN) (this is obviously an error in the function that automatically populates this portion of the chart; patient has had at least two liquid BM since 07:00 today alone)  PT/OT: Yes  DVT/PE Prophylaxis: Enoxaparin  Disposition Planning: TBD   Family Update: updated at bedside yesterday    Neysa Bonito, MD  06/07/2021, 08:38  Internal Medicine, [facility]

## 2021-06-07 NOTE — Care Plan (Signed)
Patient has slept between care tonight.  K+ level at 2200 was 1.3.  Redraw was 1.5.  After 40 meq po and 65meq IV potassium, K+ level was 1.3.  K+ runs are ongoing.  IV fluids changed to NS + 20 KCL.  VS have been stable.  One episode of ventricular bigeminy observed on monitor, which was self- teminating.   Patient is lethargic but oriented x4.  Generalized weakness of all extremities.        Problem: Adult Inpatient Plan of Care  Goal: Plan of Care Review  Outcome: Ongoing (see interventions/notes)  Goal: Patient-Specific Goal (Individualized)  Outcome: Ongoing (see interventions/notes)  Flowsheets (Taken 06/06/2021 2000)  Individualized Care Needs: Close tele monitoring  Anxieties, Fears or Concerns: none voiced  Goal: Absence of Hospital-Acquired Illness or Injury  Outcome: Ongoing (see interventions/notes)  Intervention: Identify and Manage Fall Risk  Recent Flowsheet Documentation  Taken 06/07/2021 0400 by Melford Aase, RN  Safety Promotion/Fall Prevention: safety round/check completed  Taken 06/07/2021 0000 by Melford Aase, RN  Safety Promotion/Fall Prevention: safety round/check completed  Taken 06/06/2021 2200 by Melford Aase, RN  Safety Promotion/Fall Prevention:   activity supervised   safety round/check completed  Taken 06/06/2021 2000 by Melford Aase, RN  Safety Promotion/Fall Prevention: safety round/check completed  Intervention: Prevent Skin Injury  Recent Flowsheet Documentation  Taken 06/06/2021 2000 by Melford Aase, RN  Body Position: positioned with 1 assist  Skin Protection: adhesive use limited  Intervention: Prevent and Manage VTE (Venous Thromboembolism) Risk  Recent Flowsheet Documentation  Taken 06/06/2021 2000 by Melford Aase, RN  VTE Prevention/Management: anticoagulant therapy maintained  Goal: Optimal Comfort and Wellbeing  Outcome: Ongoing (see interventions/notes)  Intervention: Provide Person-Centered Care  Recent Flowsheet Documentation  Taken 06/06/2021 2000 by Melford Aase,  RN  Trust Relationship/Rapport:   care explained   empathic listening provided   questions encouraged   reassurance provided  Goal: Rounds/Family Conference  Outcome: Ongoing (see interventions/notes)

## 2021-06-07 NOTE — Nurses Notes (Signed)
Repeat potassium level was 1.5.  Second K+ run infusing.

## 2021-06-08 DIAGNOSIS — A0472 Enterocolitis due to Clostridium difficile, not specified as recurrent: Secondary | ICD-10-CM | POA: Diagnosis present

## 2021-06-08 LAB — IRON TRANSFERRIN AND TIBC
IRON (TRANSFERRIN) SATURATION: 12 %
IRON: 44 ug/dL — ABNORMAL LOW (ref 45–170)
TOTAL IRON BINDING CAPACITY: 354 ug/dL (ref 260–400)
TRANSFERRIN: 253 mg/dL (ref 180–360)

## 2021-06-08 LAB — CBC WITH DIFF
BASOPHIL #: <0.04 10*3/uL (ref <=0.20)
BASOPHIL %: 0 %
EOSINOPHIL #: <0.04 10*3/uL (ref <=0.50)
EOSINOPHIL %: 0 %
HCT: 30 % — ABNORMAL LOW (ref 34.8–46.0)
HGB: 9.7 g/dL — ABNORMAL LOW (ref 11.5–16.0)
IMMATURE GRANULOCYTE #: 0.13 10*3/uL — ABNORMAL HIGH (ref <0.10)
IMMATURE GRANULOCYTE %: 1 % (ref 0–1)
LYMPHOCYTE #: 2.67 10*3/uL (ref 1.00–4.80)
LYMPHOCYTE %: 13 %
MCH: 24.9 pg — ABNORMAL LOW (ref 26.0–32.0)
MCHC: 32.3 g/dL (ref 31.0–35.5)
MCV: 76.9 fL — ABNORMAL LOW (ref 78.0–100.0)
MONOCYTE #: 1.17 10*3/uL — ABNORMAL HIGH (ref 0.20–1.10)
MONOCYTE %: 6 %
MPV: 11 fL (ref 8.7–12.5)
NEUTROPHIL #: 17.05 10*3/uL — ABNORMAL HIGH (ref 1.50–7.70)
NEUTROPHIL %: 80 %
PLATELETS: 309 10*3/uL (ref 150–400)
RBC: 3.9 10*6/uL (ref 3.85–5.22)
RDW-CV: 19.7 % — ABNORMAL HIGH (ref 11.5–15.5)
WBC: 21.1 10*3/uL — ABNORMAL HIGH

## 2021-06-08 LAB — BASIC METABOLIC PANEL
ANION GAP: 8 mmol/L (ref 4–13)
ANION GAP: 7 mmol/L (ref 4–13)
ANION GAP: 8 mmol/L (ref 4–13)
ANION GAP: 7 mmol/L (ref 4–13)
ANION GAP: 5 mmol/L (ref 4–13)
BUN/CREA RATIO: 8 (ref 6–22)
BUN/CREA RATIO: 8 (ref 6–22)
BUN/CREA RATIO: 7 (ref 6–22)
BUN/CREA RATIO: 6 (ref 6–22)
BUN/CREA RATIO: 7 (ref 6–22)
BUN: 8 mg/dL (ref 8–25)
BUN: 7 mg/dL — ABNORMAL LOW (ref 8–25)
BUN: 6 mg/dL — ABNORMAL LOW (ref 8–25)
BUN: 5 mg/dL — ABNORMAL LOW (ref 8–25)
BUN: 5 mg/dL — ABNORMAL LOW (ref 8–25)
CALCIUM: 9.6 mg/dL (ref 8.5–10.2)
CALCIUM: 9.1 mg/dL (ref 8.5–10.2)
CALCIUM: 9.4 mg/dL (ref 8.5–10.2)
CALCIUM: 9.4 mg/dL (ref 8.5–10.2)
CALCIUM: 8.4 mg/dL — ABNORMAL LOW (ref 8.5–10.2)
CHLORIDE: 122 mmol/L — ABNORMAL HIGH (ref 96–111)
CHLORIDE: 123 mmol/L — ABNORMAL HIGH (ref 96–111)
CHLORIDE: 122 mmol/L — ABNORMAL HIGH (ref 96–111)
CHLORIDE: 123 mmol/L — ABNORMAL HIGH (ref 96–111)
CHLORIDE: 124 mmol/L — ABNORMAL HIGH (ref 96–111)
CO2 TOTAL: 13 mmol/L — ABNORMAL LOW (ref 22–32)
CO2 TOTAL: 12 mmol/L — ABNORMAL LOW (ref 22–32)
CO2 TOTAL: 11 mmol/L — ABNORMAL LOW (ref 22–32)
CO2 TOTAL: 14 mmol/L — ABNORMAL LOW (ref 22–32)
CO2 TOTAL: 14 mmol/L — ABNORMAL LOW (ref 22–32)
CREATININE: 0.95 mg/dL (ref 0.49–1.10)
CREATININE: 0.84 mg/dL (ref 0.49–1.10)
CREATININE: 0.81 mg/dL (ref 0.49–1.10)
CREATININE: 0.79 mg/dL (ref 0.49–1.10)
CREATININE: 0.7 mg/dL (ref 0.49–1.10)
ESTIMATED GFR: >60 mL/min/{1.73_m2} (ref >60)
ESTIMATED GFR: >60 mL/min/{1.73_m2} (ref >60)
ESTIMATED GFR: >60 mL/min/{1.73_m2} (ref >60)
ESTIMATED GFR: >60 mL/min/{1.73_m2} (ref >60)
ESTIMATED GFR: >60 mL/min/{1.73_m2} (ref >60)
GLUCOSE: 112 mg/dL (ref 65–125)
GLUCOSE: 128 mg/dL — ABNORMAL HIGH (ref 65–125)
GLUCOSE: 100 mg/dL (ref 65–125)
GLUCOSE: 104 mg/dL (ref 65–125)
GLUCOSE: 89 mg/dL (ref 65–125)
POTASSIUM: 1.9 mmol/L — CL (ref 3.5–5.1)
POTASSIUM: 1.9 mmol/L — CL (ref 3.5–5.1)
POTASSIUM: 2.2 mmol/L — CL (ref 3.5–5.1)
POTASSIUM: 2.8 mmol/L — CL (ref 3.5–5.1)
POTASSIUM: 2.5 mmol/L — CL (ref 3.5–5.1)
SODIUM: 143 mmol/L (ref 136–145)
SODIUM: 142 mmol/L (ref 136–145)
SODIUM: 141 mmol/L (ref 136–145)
SODIUM: 144 mmol/L (ref 136–145)
SODIUM: 143 mmol/L (ref 136–145)

## 2021-06-08 LAB — VENOUS BLOOD GAS/LACTATE/CO-OX/LYTES (NA/K/CA/CL/GLUC)
%FIO2 (VENOUS): 21 %
BASE DEFICIT: 13.7 mmol/L — ABNORMAL HIGH (ref <=3.0)
BICARBONATE (VENOUS): 13.1 mmol/L — ABNORMAL LOW (ref 22.0–26.0)
CARBOXYHEMOGLOBIN: 0.8 % (ref 0.0–2.5)
CHLORIDE: 115 mmol/L — ABNORMAL HIGH (ref 101–111)
GLUCOSE: 133 mg/dL — ABNORMAL HIGH (ref 60–105)
HEMATOCRITRT: 37 %
HEMOGLOBIN: 12.4 g/dL (ref 12.0–18.0)
IONIZED CALCIUM: 1.48 mmol/L — ABNORMAL HIGH (ref 1.10–1.35)
LACTATE: 2.6 mmol/L — ABNORMAL HIGH (ref 0.0–1.3)
MET-HEMOGLOBIN: 0.5 % (ref 0.0–2.0)
O2CT: 8.8 % (ref 6.7–17.5)
OXYHEMOGLOBIN: 50.6 % (ref 40.0–80.0)
PCO2 (VENOUS): 30 mm/Hg — ABNORMAL LOW (ref 41–51)
PH (VENOUS): 7.23 — ABNORMAL LOW (ref 7.31–7.41)
PO2 (VENOUS): 34 mm/Hg — ABNORMAL LOW (ref 35–50)
SODIUM: 140 mmol/L (ref 137–145)
WHOLE BLOOD POTASSIUM: 2 mmol/L — CL (ref 3.5–4.6)

## 2021-06-08 LAB — FERRITIN: FERRITIN: 27 ng/mL (ref 10–200)

## 2021-06-08 LAB — MAGNESIUM: MAGNESIUM: 2.1 mg/dL (ref 1.6–2.6)

## 2021-06-08 LAB — VITAMIN D 25 TOTAL: VITAMIN D, 25OH: 12 ng/mL — ABNORMAL LOW (ref 30–100)

## 2021-06-08 MED ORDER — POTASSIUM CHLORIDE 10 MEQ/100ML IN STERILE WATER INTRAVENOUS PIGGYBACK
10.0000 meq | INJECTION | INTRAVENOUS | Status: AC
Start: 2021-06-09 — End: 2021-06-09
  Administered 2021-06-09 (×2): 10 meq via INTRAVENOUS
  Administered 2021-06-09: 04:00:00 0 meq via INTRAVENOUS
  Administered 2021-06-09: 10 meq via INTRAVENOUS
  Administered 2021-06-09 (×3): 0 meq via INTRAVENOUS
  Administered 2021-06-09: 03:00:00 10 meq via INTRAVENOUS
  Filled 2021-06-08 (×4): qty 100

## 2021-06-08 MED ORDER — POTASSIUM CHLORIDE 10 MEQ/100ML IN STERILE WATER INTRAVENOUS PIGGYBACK
10.0000 meq | INJECTION | INTRAVENOUS | Status: AC
Start: 2021-06-08 — End: 2021-06-08
  Administered 2021-06-08 (×2): 10 meq via INTRAVENOUS
  Administered 2021-06-08: 08:00:00 0 meq via INTRAVENOUS
  Administered 2021-06-08 (×2): 10 meq via INTRAVENOUS
  Administered 2021-06-08 (×2): 0 meq via INTRAVENOUS
  Filled 2021-06-08 (×4): qty 100

## 2021-06-08 MED ORDER — POTASSIUM CHLORIDE ER 20 MEQ TABLET,EXTENDED RELEASE(PART/CRYST)
20.0000 meq | ORAL_TABLET | ORAL | Status: AC
Start: 2021-06-08 — End: 2021-06-08
  Administered 2021-06-08: 03:00:00 20 meq via ORAL
  Filled 2021-06-08: qty 1

## 2021-06-08 MED ORDER — POTASSIUM CHLORIDE ER 20 MEQ TABLET,EXTENDED RELEASE(PART/CRYST)
20.0000 meq | ORAL_TABLET | ORAL | Status: AC
Start: 2021-06-09 — End: 2021-06-09
  Administered 2021-06-09: 20 meq via ORAL
  Filled 2021-06-08: qty 1

## 2021-06-08 MED ORDER — POTASSIUM BICARBONATE-CITRIC ACID 20 MEQ EFFERVESCENT TABLET
40.0000 meq | EFFERVESCENT_TABLET | Freq: Three times a day (TID) | ORAL | Status: DC
Start: 2021-06-08 — End: 2021-06-09
  Administered 2021-06-08 – 2021-06-09 (×4): 40 meq via ORAL
  Filled 2021-06-08 (×5): qty 2

## 2021-06-08 MED ORDER — VANCOMYCIN 125 MG CAPSULE
125.0000 mg | ORAL_CAPSULE | Freq: Four times a day (QID) | ORAL | Status: DC
Start: 2021-06-08 — End: 2021-06-11
  Administered 2021-06-08 – 2021-06-11 (×13): 125 mg via ORAL
  Filled 2021-06-08 (×13): qty 1

## 2021-06-08 MED ORDER — POTASSIUM CHLORIDE 10 MEQ/100ML IN STERILE WATER INTRAVENOUS PIGGYBACK
10.0000 meq | INJECTION | INTRAVENOUS | Status: AC
Start: 2021-06-08 — End: 2021-06-08
  Administered 2021-06-08 (×3): 10 meq via INTRAVENOUS
  Administered 2021-06-08 (×3): 0 meq via INTRAVENOUS
  Administered 2021-06-08: 12:00:00 10 meq via INTRAVENOUS
  Administered 2021-06-08: 12:00:00 0 meq via INTRAVENOUS
  Filled 2021-06-08 (×4): qty 100

## 2021-06-08 MED ORDER — MULTIVITAMIN WITH FOLIC ACID 400 MCG TABLET
1.0000 | ORAL_TABLET | Freq: Every day | ORAL | Status: DC
Start: 2021-06-08 — End: 2021-06-11
  Administered 2021-06-08 – 2021-06-11 (×4): 1 via ORAL
  Filled 2021-06-08 (×4): qty 1

## 2021-06-08 NOTE — Care Plan (Signed)
Pt A&O, VS WNL, Telemetry shows NSR 70-80's. O2 saturations 90's on RA. 3 episodes of diarrhea as of this time, voiding spontaneously on her own using the bedpan. Pt rec'd 4 runs of potassium via IV, and 3 PO doses. Pt continues on MIVF per physician orders. Bed in lowest position, call light within reach, all needs met at this time. Will continue to monitor throughout the shift.   Problem: Adult Inpatient Plan of Care  Goal: Plan of Care Review  Outcome: Ongoing (see interventions/notes)  Goal: Patient-Specific Goal (Individualized)  Outcome: Ongoing (see interventions/notes)  Flowsheets (Taken 06/08/2021 0800)  Individualized Care Needs: vanco, MIVF, monitor labs  Anxieties, Fears or Concerns: my potassium  Goal: Absence of Hospital-Acquired Illness or Injury  Outcome: Ongoing (see interventions/notes)  Intervention: Identify and Manage Fall Risk  Recent Flowsheet Documentation  Taken 06/08/2021 0800 by Melina Schools, RN  Safety Promotion/Fall Prevention:   activity supervised   fall prevention program maintained   motion sensor pad activated   nonskid shoes/slippers when out of bed   safety round/check completed  Intervention: Prevent Skin Injury  Recent Flowsheet Documentation  Taken 06/08/2021 0800 by Melina Schools, RN  Body Position: fowlers ( 45-60 degrees)  Skin Protection: adhesive use limited  Intervention: Prevent and Manage VTE (Venous Thromboembolism) Risk  Recent Flowsheet Documentation  Taken 06/08/2021 0800 by Melina Schools, RN  VTE Prevention/Management:   ambulation promoted   dorsiflexion/plantar flexion performed  Intervention: Prevent Infection  Recent Flowsheet Documentation  Taken 06/08/2021 0800 by Melina Schools, RN  Infection Prevention:   personal protective equipment utilized   promote handwashing   rest/sleep promoted   single patient room provided  Goal: Optimal Comfort and Wellbeing  Outcome: Ongoing (see interventions/notes)  Intervention: Provide Person-Centered  Care  Recent Flowsheet Documentation  Taken 06/08/2021 0800 by Melina Schools, RN  Trust Relationship/Rapport:   care explained   choices provided   questions encouraged   questions answered   thoughts/feelings acknowledged  Goal: Rounds/Family Conference  Outcome: Ongoing (see interventions/notes)     Problem: Health Knowledge, Opportunity to Enhance (Adult,Obstetrics,Pediatric)  Goal: Knowledgeable about Health Subject/Topic  Description: Patient will demonstrate the desired outcomes by discharge/transition of care.  Outcome: Ongoing (see interventions/notes)     Problem: Skin Injury Risk Increased  Goal: Skin Health and Integrity  Outcome: Ongoing (see interventions/notes)  Intervention: Optimize Skin Protection  Recent Flowsheet Documentation  Taken 06/08/2021 1600 by Melina Schools, RN  Pressure Reduction Techniques:   frequent weight shift encouraged   weight shift assistance provided  Pressure Reduction Devices: (L.M,H,VH) Pressure redistributing mattress utilized  Taken 06/08/2021 1200 by Melina Schools, RN  Pressure Reduction Techniques:   frequent weight shift encouraged   weight shift assistance provided  Pressure Reduction Devices: (L.M,H,VH) Pressure redistributing mattress utilized  Taken 06/08/2021 0800 by Melina Schools, RN  Pressure Reduction Techniques:   frequent weight shift encouraged   weight shift assistance provided  Pressure Reduction Devices: (L.M,H,VH) Pressure redistributing mattress utilized  Skin Protection: adhesive use limited  Activity Management:   activity adjusted per tolerance   activity clustered for rest periods  Head of Bed [facility]) Positioning: HOB elevated     Problem: Fall Injury Risk  Goal: Absence of Fall and Fall-Related Injury  Outcome: Ongoing (see interventions/notes)  Intervention: Identify and Manage Contributors  Recent Flowsheet Documentation  Taken 06/08/2021 0800 by Melina Schools, RN  Self-Care Promotion: independence encouraged  Intervention: Promote  Injury-Free Environment  Recent Flowsheet Documentation  Taken 06/08/2021 0800 by Melina Schools, RN  Safety Promotion/Fall Prevention:   activity supervised   fall prevention program maintained   motion sensor pad activated   nonskid shoes/slippers when out of bed   safety round/check completed     Problem: Oral Intake Inadequate  Goal: Improved Oral Intake  Outcome: Ongoing (see interventions/notes)

## 2021-06-08 NOTE — Care Plan (Signed)
[place]  Medical Nutrition Therapy Screen Note                                      Date of Service: 06/08/2021    Reason for Note: Nursing Nutritional Risk notification:  weight loss in the last 3 months or less    Current Diet Order/Nutrition Support:  DIET REGULAR    Reviewed patient status, diet order/TF/TPN, labs and medications.  Height: 167.6 cm (5\' 6" )   Weight: 63.1 kg (139 lb 1.8 oz) (06/08/21 0600)   Body mass index is 22.45 kg/m.    Brief Subjective:   Was unable to get a hold of pt via hospital phone. Assessment per EMR. Per H&P, N/V and poor PO intake x 3 days PTA. Average PO intake of documented meals is 0%. No recent wt hx in chart. No food allergies listed in the chart. No wounds or edema charted. 10/31 lab K+ 2.2, low - per [facility] pt receiving Effer-K.     Most Recent Screen Previous screen   Impaired Nutrition Status Score Impaired Nutrition Status Score: 3 - Food intake 0 -25% of normal requirement in preceding week - Severe (06/08/21 1600)   Impaired Nutrition Status Score: 3 - Food intake 0 -25% of normal requirement in preceding week - Severe (06/08/21 1600)           Severity of Disease Score Severity of Disease Score: 0 - Normal nutritional requirements - Absent (06/08/21 1600)   Severity of Disease Score: 0 - Normal nutritional requirements - Absent (06/08/21 1600)       Age Age: 10 - < 70 years (06/08/21 1600) Age: 10 - < 70 years (06/08/21 1600)     Score Score: 3 (06/08/21 1600)     Score: 3 (06/08/21 1600)   Risk Level Risk Level: Level 3 - 3 pts - Screen note required (06/08/21 1600)   Risk Level: Level 3 - 3 pts - Screen note required (06/08/21 1600)           Nutrition related problems: Inadequate oral intake    Assessment: No assessment at this time    Monitor: Po status and Tolerance of diet    Plan/Intervention:   1. Continue current diet order.   -Encourage small frequent meals    2. Initiate Ensure TID  to aid in PO intake     3. Daily wt to monitor trend     4. Initiate daily multivitamin to replenish/prevent micronutrient deficiencies     5. Monitor BM for regularity - last BM 10/31 per EMR    Will monitor progress and make further recommendations prn.    Sondra Barges, [place]   Pager # [phone]

## 2021-06-08 NOTE — Nurses Notes (Signed)
Patient has been unable to void until now.  Voided to bedpan, mixed with loose stool.  Bladder scanned post void with 18ml noted in bladder.

## 2021-06-08 NOTE — Care Plan (Signed)
Patient has rested fairly well tonight between care.  Much more alert tonight.  Able to move extremities better.  K+ runs continued  hourly throughout the night.  Last K+ level was 1.9.  New labs pending.  VSS.  NSR on monitor. No ectopy observed tonight.  Now voiding WNL to bedpan.  2 episodes of diarrhea tonight.  Patient is positive for C-diff.  Started on Vancocin po.        Problem: Adult Inpatient Plan of Care  Goal: Plan of Care Review  Outcome: Ongoing (see interventions/notes)  Goal: Patient-Specific Goal (Individualized)  Outcome: Ongoing (see interventions/notes)  Flowsheets (Taken 06/07/2021 2000)  Individualized Care Needs: Tele, bladder scan, incont care  Anxieties, Fears or Concerns: none voiced  Goal: Absence of Hospital-Acquired Illness or Injury  Outcome: Ongoing (see interventions/notes)  Intervention: Identify and Manage Fall Risk  Recent Flowsheet Documentation  Taken 06/08/2021 0400 by Melford Aase, RN  Safety Promotion/Fall Prevention: safety round/check completed  Taken 06/08/2021 0158 by Melford Aase, RN  Safety Promotion/Fall Prevention: safety round/check completed  Taken 06/08/2021 0000 by Melford Aase, RN  Safety Promotion/Fall Prevention: safety round/check completed  Taken 06/07/2021 2200 by Melford Aase, RN  Safety Promotion/Fall Prevention: safety round/check completed  Taken 06/07/2021 2000 by Melford Aase, RN  Safety Promotion/Fall Prevention: safety round/check completed  Intervention: Prevent Skin Injury  Recent Flowsheet Documentation  Taken 06/07/2021 2000 by Melford Aase, RN  Body Position: supine, head elevated  Skin Protection: adhesive use limited  Intervention: Prevent and Manage VTE (Venous Thromboembolism) Risk  Recent Flowsheet Documentation  Taken 06/07/2021 2000 by Melford Aase, RN  VTE Prevention/Management: ambulation promoted  Goal: Optimal Comfort and Wellbeing  Outcome: Ongoing (see interventions/notes)  Intervention: Provide Person-Centered Care  Recent  Flowsheet Documentation  Taken 06/07/2021 2000 by Melford Aase, RN  Trust Relationship/Rapport:   care explained   choices provided   emotional support provided  Goal: Rounds/Family Conference  Outcome: Ongoing (see interventions/notes)

## 2021-06-08 NOTE — Care Management Notes (Signed)
[place] Management Initial Evaluation    Patient Name: Teresa Ramirez  Date of Birth: 1960-10-25  Sex: female  Date/Time of Admission: 06/06/2021 12:08 PM  Room/Bed: 17/A  Payor: HU[employer] MEDICARE / Plan: HU[employer] CHOICE PPO / Product Type: PPO /   Primary Care Providers:  Teresa Robinsons, DO, DO (General)    Pharmacy Info:   Preferred Pharmacy       [id number] [place]   [id number]   Phone: [phone]: [phone]   Hours: Not open 24 hours    [id number] [place] [place] [facility] [address]  [address] [id number]  Phone: [phone]: [phone]  Hours: Open 24 hours          Emergency Contact Info:   Extended Emergency Contact Information  Primary Emergency Contact: Ramirez,Teresa  Address: [address]         [phone]: [phone] Phone: [phone] Phone: [phone]: Mother  Secondary Emergency Contact: RoHewitt BladeMobile Phone: [phone]: Significant other    History:   TePANSY OSTROVSKYs a [age].o., female, admitted Hypercalcemia    Height/Weight: 167.6 cm (_0 ) / 63.1 kg (139 lb 1.8 oz)     LOS: 2 days   Admitting Diagnosis: Hypercalcemia [E83.52]    Assessment:      06/08/21 1253   Assessment Details   Assessment Type Admission   Date of Care Management Update 06/08/21   Date of Next DCP Update 06/10/21   Readmission   Is this a readmission? No   Insurance Information/Type   Insurance type Medicare   Employment/Financial   Patient has Prescription Coverage?  Yes  ([facility])        Name of Insurance Coverage for Medications [employer]   Financial Concerns none   Living Environment   Select an age group to open "lives with" row.  Adult   Lives With parent(s)  (mom)   Living Arrangements mobile home   Able to Return to  Prior Arrangements yes   Home Safety   Home Assessment: No Problems Identified   Home Accessibility stairs to enter home   Custody and Legal Status   Do you have a court appointed guardian/conservator? No   Care Management Plan   Discharge Planning Status initial meeting   Projected Discharge Date 06/09/21   Discharge plan discussed with: Patient   CM will evaluate for rehabilitation potential no   Patient choice offered to patient/family no   Form for patient choice reviewed/signed and on chart no   Patient aware of possible cost for ambulance transport?  No   Discharge Needs Assessment   Equipment Currently Used at Home none   Equipment Needed After Discharge none   Discharge Facility/Level of Care Needs Home (Patient/Family Member/other)(code 1)   Transportation Available car;family or friend will provide  (mom)   Referral Information   Admission Type inpatient   Address Verified verified-no changes   Arrived From home or self-care   ADVANCE DIRECTIVES   Does the Patient have an Advance Directive? No, Information Offered and Refused   LAY CAREGIVER    Appointed Lay Caregiver? Yes   Lay  Caregiver Name Teresa Ramirez   Lay Caregiver Relationship to patient parent   Teresa Ramirez Caregiver Contact Number [phone]     Pt is a female aged 60 y.o. who presents with N/V/D.    MSW met with pt at bedside. Introduced self and explained role of care mgmt. Pt lives with mom in a mobile home with STE. She is not active with HH nor any other in services. She does not use any DME. She has no advance directives and declined info at this time. Verified address, phone and insurance. Last saw PCP was over a year ago.  [facility]. Plan to DC home when medically stable. Mom likely to transport home. No anticipated DC needs at this time. Orders for telemetry, iv abx, med mgmt, and labs, and PT/OT eval.    Discharge Plan:  Home (Patient/Family Member/other) (code 1)      The patient will continue to be evaluated for developing discharge  needs.     Case Manager: Teresa Ramirez, [place]  Phone: [phone]

## 2021-06-08 NOTE — Progress Notes (Addendum)
[facility]  Medicine Progress Note    [name]  Date of service: 06/08/2021  Date of admission: 06/06/2021   LOS: 2 days   Code status: Full Code    Subjective:  Overnight patient continuing to have diarrhea.  Today also c/o aching in arms from IV potassium.      Objective:  I/O:  I/O last 24 hours:      Intake/Output Summary (Last 24 hours) at 06/08/2021 1209  Last data filed at 06/07/2021 1700  Gross per 24 hour   Intake --   Output 400 ml   Net -400 ml     I/O current shift:  No intake/output data recorded.    Current Medications:  acetaminophen (TYLENOL) tablet, 650 mg, Oral, Q6H PRN  D5W 250 mL flush bag, , Intravenous, Q15 Min PRN  enoxaparin PF (LOVENOX) 40 mg/0.4 mL SubQ injection, 40 mg, Subcutaneous, Q24H  levETIRAcetam (KEPPRA) tablet, 500 mg, Oral, 2x/day  levothyroxine (SYNTHROID) tablet, 75 mcg, Oral, QAM  NS 1000 mL with potassium chloride 20 mEq premix infusion, , Intravenous, Continuous  NS 250 mL flush bag, , Intravenous, Q15 Min PRN  NS flush syringe, 2-6 mL, Intracatheter, Q8HRS  NS flush syringe, 2-6 mL, Intracatheter, Q1 MIN PRN  ondansetron (ZOFRAN) 2 mg/mL injection, 4 mg, Intravenous, Q8H PRN  potassium bicarbonate-citric acid (EFFER-K) effervescent tablet, 40 mEq, Oral, 3x/day-Meals  potassium chloride 10 mEq in SW 100 mL premix infusion, 10 mEq, Intravenous, Q1H  vancomycin (VANCOCIN) capsule, 125 mg, Oral, 4x/day  venlafaxine (EFFEXOR XR) 24 hr extended release capsule, 75 mg, Oral, Daily    Vital Signs:  Filed Vitals:    06/07/21 2018 06/08/21 0016 06/08/21 0328 06/08/21 0730   BP: (!) 140/68 (!) 142/75 (!) 164/71 (!) 155/73   Pulse: 70 73 71 69   Resp: (!) 21 (!) 24 (!) 23 20   Temp: 35.9 C (96.6 F) 36.2 C (97.2 F) 35.7 C (96.3 F) 35.9 C (96.6 F)   SpO2: 100% 100% 99% 100%     Physical Exam:  General: appears listed age, ill & uncomfortable  Eyes: Sclera non-icteric.   HENT:Mouth mucous membranes dry.   Neck: supple, symmetrical, trachea midline  Lungs:  normal respiratory effort  Cardiovascular:    regular, rate approximately 76  Abdomen: non-distended  Extremities: No cyanosis or edema  Skin: Skin warm and dry    Labs:  CBC Results Differential Results   Recent Results (from the past 30 hour(s))   CBC WITH DIFF    Collection Time: 06/08/21  4:32 AM   Result Value    WBC 21.1 (H)    HGB 9.7 (L)    HCT 30.0 (L)    PLATELETS 309    Recent Results (from the past 30 hour(s))   CBC WITH DIFF    Collection Time: 06/08/21  4:32 AM   Result Value    WBC 21.1 (H)    NEUTROPHIL % 80    MONOCYTE % 6    BASOPHIL % 0    BASOPHIL # <0.04      BMP Results Other Chemistries Results   Results for orders placed or performed during the hospital encounter of 06/06/21 (from the past 30 hour(s))   BASIC METABOLIC PANEL    Collection Time: 06/08/21  7:07 AM   Result Value    SODIUM 141    POTASSIUM 2.2 (LL)    CHLORIDE 122 (H)    CO2 TOTAL 11 (L)    GLUCOSE  100    BUN 6 (L)    CREATININE 0.81    Recent Results (from the past 30 hour(s))   MAGNESIUM    Collection Time: 06/08/21  4:32 AM   Result Value    MAGNESIUM 2.1      Liver/Pancreas Enzyme Results Liver Function Results   No results found for this or any previous visit (from the past 30 hour(s)). No results found for this or any previous visit (from the past 30 hour(s)).   Cardiac Results Coags Results   No results found for this or any previous visit (from the past 30 hour(s)). No results found for this or any previous visit (from the past 30 hour(s)).     Microbiology:    Hospital Encounter on 06/06/21 (from the past 24 hour(s))   CLOSTRIDIUM DIFFICILE TOXIN DETECTION    Collection Time: 06/07/21 12:55 PM    Specimen: Stool; Other   Culture Result Status    C. DIFFICILE INTERPRETATION Positive (A) Final    C. DIFFICILE TOXIN GENE, PCR Positive Final    C. difficile toxin Positive Final    Narrative    Presence of toxin-producing C. difficile confirmed.    Implement Contact II precautions for INDETERMINATE or POSITIVE results.      This test algorithm incorporates two FDA-cleared assays: (1) toxin gene PCR using Cepheid GeneXpert C. difficle assay, and (2) immunoassay for toxins A/B using TechLab Quik Chek Complete.       Radiology:  No new results today      Assessment:  Active Hospital Problems    Diagnosis   . Primary Problem: Hyperkalemia   . C. difficile diarrhea   . Hypercalcemia   . AKI (acute kidney injury) (CMS HCC)   . Nausea vomiting and diarrhea   . Hyperammonemia (CMS HCC)   . Metabolic acidosis, increased anion gap (IAG)   . Metabolic acidosis, normal anion gap (NAG)   . Hematuria   . Prolonged Q-T interval on ECG   . Hypothyroidism   . Epilepsy (CMS [place])     Teresa Ramirez is a female aged 60 y.o. with h/o hypothyroidism, epilepsy, & recent acute liver illness of unclear etiology who was admitted 06/06/2021 for N/V/D with severe metabolic derangements including hypercalcemia, hypokalemia, hyperammonemia, metabolic acidosis, & AKI.  Presentation also notable for prolonged QT.  Hospital course notable for improvement in some parameters, but remains persistently severely hypokalemic.  Now also found to have clostridial source of diarrhea.      Plan:  1. Hypokalemia  N/V/D  Metabolic Acidosis  Prolonged QTc, improved  AKI, improved  Serum K this AM 2.2 from 1.4 yesterday.  HCO3 11 from 12, Cr 0.81 from 0.97.  VBG with pH 7.24 on presentation, 7.23 today.  Blood gas & urine electrolytes consistent with multiple sources of metabolic acidosis, including at least one IAG & at least one NAG process; contributions include diarrheal loss of bicarbonate, elevated lactic acid.  QT now improved from corrected value 571 on presentation to 498 today.  - ordering additional potassium PO & IV; will follow serial BMP  - will resume ondansetron PRN now that QT is not dangerously prolonged  - continue IVF    2. Clostridial Diarrhea  Cdiff toxin positive 10/30.  - started vancomycin PO    3. Hypercalcemia, improved  Serum Ca today 9.4 from  13.4 on presentation.  PTH 20.8, most consistent with non-PTH-mediated etiology (differential including malignancy, other).  - continue saline infusion  - will  check PTHRP, Vitamin D studies    4. Hyperammonemia, improved  Serum NH3 106 on presentation, improved to 46.  Etiology unclear, hepatic function panel otherwise unimpressive.  US liver with heterogenous hepatic echotexture, but otherwise not diagnostic.  - no acute interventions given improvement    Chronic Medical Conditions  Epilepsy: continue home levetiracetam  Hypothyroidism: continue home levothyroxine    Hardware (lines, foley's, tubes):  Patient Lines/Drains/Airways Status     Active Line / Dialysis Catheter / Dialysis Graft / Drain / Airway / Wound     Name Placement date Placement time Site Days    Peripheral IV Right Median Cubital  (antecubital fossa) 06/06/21  1220  -- 1    Peripheral IV Ultrasound guided;Extended dwell catheter Left Median Cubital  (antecubital fossa) 06/07/21  2344  -- less than 1              Consults: none  Diet: regular  Activity: up in chair BID  BM: Date of Last Bowel Movement: 06/08/21  PT/OT: Yes  DVT/PE Prophylaxis: Enoxaparin  Disposition Planning: TBD   Family Update: none given today    Critical Care Attestation    I was present at the bedside of this critically ill patient for 35 minutes exclusive of procedures. This patient suffers from failure or dysfunction of 4 system(s). The care of this patient was in regard to managing (a) conditions(s) that has a high probability of sudden, clinically significant, or life-threatening deterioration and required a high degree of Attending Physician attention and direct involvement to intervene urgently. Data review and care planning was performed in direct proximity of the patient, examination was obviously performed in direct contact with the patient. All of this time was exclusive of procedure which will be documented elsewhere in the chart.    My critical care time  involved full attention to the patient's condition and included:    Review of nursing notes and/or old charts  Review of medications, allergies, and vital signs  Documentation time  Consultant collaboration on findings and treatment options  Care, transfer of care, and discharge plans  Ordering, interpreting, and reviewing diagnostic studies/tab tests  Obtaining necessary history from family, EMS, nursing home staff and/or treating physicians    My critical care time did not include time spent teaching resident physician(s) or other services of resident physicians, or performing other reported procedures.  Total Critical Care Time: 35 minutes      Neysa Bonito, MD  06/08/21 12:09  Assistant Professor, Internal Medicine  [facility]

## 2021-06-09 LAB — VENOUS BLOOD GAS/LACTATE/CO-OX/LYTES (NA/K/CA/CL/GLUC)
%FIO2 (VENOUS): 21 %
BASE DEFICIT: 10.2 mmol/L — ABNORMAL HIGH (ref <=3.0)
BICARBONATE (VENOUS): 16.4 mmol/L — ABNORMAL LOW (ref 22.0–26.0)
CARBOXYHEMOGLOBIN: 1.4 % (ref 0.0–2.5)
CHLORIDE: 118 mmol/L — ABNORMAL HIGH (ref 101–111)
GLUCOSE: 110 mg/dL — ABNORMAL HIGH (ref 60–105)
HEMATOCRITRT: 24 %
HEMOGLOBIN: 8.1 g/dL — ABNORMAL LOW (ref 12.0–18.0)
IONIZED CALCIUM: 1.4 mmol/L — ABNORMAL HIGH (ref 1.10–1.35)
LACTATE: 1.9 mmol/L — ABNORMAL HIGH (ref 0.0–1.3)
MET-HEMOGLOBIN: 0.1 % (ref 0.0–2.0)
O2CT: 6.9 % (ref 6.7–17.5)
OXYHEMOGLOBIN: 59.9 % (ref 40.0–80.0)
PCO2 (VENOUS): 32 mm/Hg — ABNORMAL LOW (ref 41–51)
PH (VENOUS): 7.29 — ABNORMAL LOW (ref 7.31–7.41)
PO2 (VENOUS): 40 mm/Hg (ref 35–50)
SODIUM: 141 mmol/L (ref 137–145)
WHOLE BLOOD POTASSIUM: 3 mmol/L — ABNORMAL LOW (ref 3.5–4.6)

## 2021-06-09 LAB — CBC WITH DIFF
BASOPHIL #: <0.04 10*3/uL (ref <=0.20)
BASOPHIL %: 0 %
EOSINOPHIL #: <0.04 10*3/uL (ref <=0.50)
EOSINOPHIL %: 0 %
HCT: 25.5 % — ABNORMAL LOW (ref 34.8–46.0)
HGB: 8.3 g/dL — ABNORMAL LOW (ref 11.5–16.0)
IMMATURE GRANULOCYTE #: 0.08 10*3/uL (ref <0.10)
IMMATURE GRANULOCYTE %: 1 % (ref 0–1)
LYMPHOCYTE #: 3.26 10*3/uL (ref 1.00–4.80)
LYMPHOCYTE %: 26 %
MCH: 25.1 pg — ABNORMAL LOW (ref 26.0–32.0)
MCHC: 32.5 g/dL (ref 31.0–35.5)
MCV: 77 fL — ABNORMAL LOW (ref 78.0–100.0)
MONOCYTE #: 0.94 10*3/uL (ref 0.20–1.10)
MONOCYTE %: 8 %
MPV: 11.3 fL (ref 8.7–12.5)
NEUTROPHIL #: 8.04 10*3/uL — ABNORMAL HIGH (ref 1.50–7.70)
NEUTROPHIL %: 65 %
PLATELETS: 259 10*3/uL (ref 150–400)
RBC: 3.31 10*6/uL — ABNORMAL LOW (ref 3.85–5.22)
RDW-CV: 19.7 % — ABNORMAL HIGH (ref 11.5–15.5)
WBC: 12.3 10*3/uL — ABNORMAL HIGH

## 2021-06-09 LAB — BASIC METABOLIC PANEL
ANION GAP: 5 mmol/L (ref 4–13)
ANION GAP: 6 mmol/L (ref 4–13)
BUN/CREA RATIO: 7 (ref 6–22)
BUN/CREA RATIO: 6 (ref 6–22)
BUN: 5 mg/dL — ABNORMAL LOW (ref 8–25)
BUN: 4 mg/dL — ABNORMAL LOW (ref 8–25)
CALCIUM: 8.7 mg/dL (ref 8.5–10.2)
CALCIUM: 8.8 mg/dL (ref 8.5–10.2)
CHLORIDE: 123 mmol/L — ABNORMAL HIGH (ref 96–111)
CHLORIDE: 120 mmol/L — ABNORMAL HIGH (ref 96–111)
CO2 TOTAL: 15 mmol/L — ABNORMAL LOW (ref 22–32)
CO2 TOTAL: 15 mmol/L — ABNORMAL LOW (ref 22–32)
CREATININE: 0.71 mg/dL (ref 0.49–1.10)
CREATININE: 0.69 mg/dL (ref 0.49–1.10)
ESTIMATED GFR: >60 mL/min/{1.73_m2} (ref >60)
ESTIMATED GFR: >60 mL/min/{1.73_m2} (ref >60)
GLUCOSE: 104 mg/dL (ref 65–125)
GLUCOSE: 97 mg/dL (ref 65–125)
POTASSIUM: 2.9 mmol/L — ABNORMAL LOW (ref 3.5–5.1)
POTASSIUM: 3 mmol/L — ABNORMAL LOW (ref 3.5–5.1)
SODIUM: 143 mmol/L (ref 136–145)
SODIUM: 141 mmol/L (ref 136–145)

## 2021-06-09 LAB — MAGNESIUM: MAGNESIUM: 2 mg/dL (ref 1.6–2.6)

## 2021-06-09 MED ORDER — POTASSIUM CHLORIDE ER 20 MEQ TABLET,EXTENDED RELEASE(PART/CRYST)
40.0000 meq | ORAL_TABLET | Freq: Three times a day (TID) | ORAL | Status: DC
Start: 2021-06-09 — End: 2021-06-11
  Administered 2021-06-09 – 2021-06-11 (×6): 40 meq via ORAL
  Filled 2021-06-09 (×6): qty 2

## 2021-06-09 NOTE — Care Plan (Signed)
[place]  Occupational Therapy Initial Evaluation & Treatment    Patient Name: Teresa Ramirez  Date of Birth: [birth year]  Height: Height: 167.6 cm ('5\' 6"' )  Weight: Weight: 63.1 kg (139 lb 1.8 oz)  Room/Bed: 17/A  Payor: HU[employer]NA MEDICARE / Plan: HU[employer]NA CHOICE PPO / Product Type: PPO /     Assessment:   Teresa Ramirez tolerated OT evaluation fairly. Patient reports she is fully independent without AE at baseline. Patient currently presents with generalized weakness and deconditioning, impaired balance, and low activity tolerance.  Requires CGA for standing ADLs and minA of 2 for functional mobility limited to ~12' in room. Patient is not safe for d/c home at this time, recommend continued therapy in inpatient rehab.      Discharge Needs:   Equipment Recommendation: to be determined    Discharge Disposition: inpatient rehabilitation facility  JUSTIFICATION OF DISCHARGE RECOMMENDATION   Based on current diagnosis, functional performance prior to admission, and current functional performance, this patient requires continued OT services in inpatient rehabilitation facility  in order to achieve significant functional improvements.  The above recommendation is based upon the current examination and evaluation performed on this date. As subsequent sessions are completed, recommendations will be updated accordingly.    Plan:   Current Intervention: ADL retraining, balance training, bed mobility training, endurance training, strengthening, therapeutic exercise, transfer training  To provide Occupational therapy services 1x/day, minimum of 3x/week, until discharge.       The risks/benefits of therapy have been discussed with the patient/caregiver and he/she is in agreement with the established plan of care.       Subjective & Objective        06/09/21 1011   Therapist Pager   OT Assigned/ Pager # Micheal Murad [phone]   Rehab Session   Document Type evaluation   Total OT Minutes: 25   Patient Effort good    Symptoms Noted During/After Treatment fatigue   General Information   Patient Profile Reviewed yes   General Observations of Patient patient agreeable to OT, seen along with professional assist of PT for clustering care.   Pertinent History of Current Functional Problem Teresa Ramirez is a female aged 60 y.o. who presents with N/V/D.  Patient has h/o epilepsy, hypothyroidism; also h/o acute liver pathology for which she was evaluated in [place]; she does not recall Dx or treatment, but her liver disease apparently improved.     Patient was seen here 10/20 for N/V & poor PO intake x 3 days following 2 weeks of headache & URI Sx.  She was noted to have hypokalemia, given Rx for ondansetron & potassium supplement.  Since then she continues to have N/V/D & poor PO intake.   Medical Lines PIV Line;Telemetry   Respiratory Status room air   Existing Precautions/Restrictions full code;fall precautions;contact isolation  (c-diff)   Pre Treatment Status   Pre Treatment Patient Status Patient supine in bed;Call light within reach;Telephone within reach;Sitter select activated;Nurse approved session   Support Present Pre Treatment  None   Communication Pre Treatment  Nurse   Mutuality/Individual Preferences   Individualized Care Needs OOB with A x 1   Living Environment   Living Environment Comment reports she was staying with mother PTA but states she will probably go visit her boyfriend at d/c.   Functional Level Prior   Ambulation 0 - independent   Transferring 0 - independent   Toileting 0 - independent   Bathing 0 -  independent   Dressing 0 - independent   Eating 0 - independent   Communication 0 - understands/communicates without difficulty   Prior Functional Level Comment reports independent without DME PTA   Pain Assessment   Pre/Posttreatment Pain Comment denies pain   Coping/Psychosocial   Observed Emotional State calm;cooperative;flat   Coping/Psychosocial Response Interventions   Plan Of Care Reviewed With  patient   Cognitive Assessment/Interventions   Behavior/Mood Observations alert;cooperative   Orientation Status oriented x 4   Attention WNL/WFL   Follows Commands follows one step commands   RUE Assessment   RUE Assessment   (gross weakness)   LUE Assessment   LUE Assessment   (gross weakness)   Mobility Assessment/Training   Mobility Comment functional mobility ~10' x 2 with min HHA x 2. seated rest break required between due to fatigue   Bed Mobility Assessment/Treatment   Supine-Sit Independence minimum assist (75% patient effort)   Transfer Assessment/Treatment   Sit-Stand Independence minimum assist (75% patient effort);verbal cues required   Stand-Sit Independence minimum assist (75% patient effort);verbal cues required   Sit-Stand-Sit, Assist Device handheld assist   Bed-Chair Independence minimum assist (75% patient effort)   Bed-Chair-Bed Assist Device handheld assist   Lower Body Dressing Assessment/Training   Comment donned underwear while seated EOB with supervision, CGA for balance while standing to pull up   Grooming Assessment/Training   Comment static standing at sink with CGA and VCs to complete oral hygiene.   Balance Skill Training   Sitting Balance: Static fair balance   Sitting, Dynamic (Balance) fair - balance   Sit-to-Stand Balance fair - balance   Standing Balance: Static fair - balance   Standing Balance: Dynamic fair - balance   Post Treatment Status   Post Treatment Patient Status Patient sitting in bedside chair or w/c;Call light within reach;Telephone within reach;Sitter select activated   Support Present Press photographer Nurse   Care Plan Goals   OT Rehab Goals Occupational Therapy Goal;Bed Mobility Goal;LB Dressing Goal;Toileting Goal;Transfer Training Goal 2   Occupational Therapy Goals   OT Goal, Date Established 06/09/21   OT Goal, Time to Achieve by discharge   OT Goal, Activity Type functional mobility household distance or more   OT Goal,  Independence Level independent   Bed Mobility Goal   Bed Mobility Goal, Date Established 06/09/21   Bed Mobility Goal, Time to Achieve by discharge   Bed Mobility Goal, Activity Type all bed mobility activities   Bed Mobility Goal, Independence Level independent   LB Dressing Goal   LB Dressing Goal, Date Established 06/09/21   LB Dressing Goal, Time to Achieve by discharge   LB Dressing Goal, Activity Type all lower body dressing tasks   LB Dressing Goal, Independence Level independent   Toileting Goal   Toileting Goal, Date Established 06/09/21   Toileting Goal, Time to Achieve by discharge   Toileting Goal, Activity Type all toileting tasks   Toileting Goal, Independence Level independent   Transfer Training Goal 2   Transfer Training Goal, Date Established 06/09/21   Transfer Training Goal, Time to Achieve by discharge   Transfer Training Goal, Activity Type all transfers   Transfer Training Goal, Independence Level independent   Planned Therapy Interventions, OT Eval   Planned Therapy Interventions ADL retraining;balance training;bed mobility training;endurance training;strengthening;therapeutic exercise;transfer training   Functional Impairment   Overall Functional Impairments/Problem List balance impaired;endurance;strength decreased   Clinical Impression   Functional Level at Time of  Session Teresa Ramirez tolerated OT evaluation fairly. Patient reports she is fully independent without AE at baseline. Patient currently presents with generalized weakness and deconditioning, impaired balance, and low activity tolerance.  Requires CGA for standing ADLs and minA of 2 for functional mobility limited to ~12' in room. Patient is not safe for d/c home at this time, recommend continued therapy in inpatient rehab.   Criteria for Skilled Therapeutic Interventions Met (OT) yes;skilled treatment is necessary   Rehab Potential good   Therapy Frequency 1x/day;minimum of 3x/week   Predicted Duration of Therapy until discharge    Anticipated Equipment Needs at Discharge to be determined   Anticipated Discharge Disposition inpatient rehabilitation facility   Evaluation Complexity Justification   Occupational Profile Review Expanded review   Performance Deficits Endurance;Balance;Mobility;Strength;3-5 deficits   Clinical Decision Making Moderate analytic complexity   Evaluation Complexity Moderate       Therapist:   Johann Capers, MOT, OTR/L  Pager #: [phone]

## 2021-06-09 NOTE — Care Plan (Signed)
Teresa Ramirez  Teresa Ramirez  Physical Therapy Initial Evaluation    Patient Name: Teresa Ramirez  Date of Birth: 03/15/1961  Height: Height: 167.6 cm (5\' 6" )  Weight: Weight: 63.1 kg (139 lb 1.8 oz)  Room/Bed: 17/A  Payor: HUMANA MEDICARE / Plan: HUMANA CHOICE PPO / Product Type: PPO /     Assessment:      [name] is a 60 yo F seen for PT evaluation this date and tolerated fair. She was alert and cooperative throughout. Pt did not provide accurate living arrangement info when questioned. chart reports she lives with mother but when asked she stated "I was staying with her". PT asked about post d/c living arrangement and she reports "I may go stay with my boyfriend". does indicate that prior to admission she was fully indep w/o DME. at arrival she was supine in bed and transferred to EOB W/ Min A. at EOB she required Min A for sit to stand using [place] A and was able to ambulate ~10' to other side of bed w/ Min HH A. somewhat unsteady throughout gait and had to sit for rest following. after seated rest she was able to stand at sink with CGA in order to perform oral hygiene and then required [place] A to return ambulate back to other side of bed to chair. min a to transfer to chair. fatigued following but vitals were stable throughout. at this time pt is well below her functional baseline and will require IPR at d/c in order to return to full functional indep    Discharge Needs:    Equipment Recommendation: none anticipated    Discharge Disposition: inpatient rehabilitation facility    JUSTIFICATION OF DISCHARGE RECOMMENDATION   Based on current diagnosis, functional performance prior to admission, and current functional performance, this patient requires continued PT services in inpatient rehabilitation facility in order to achieve significant functional improvements in these deficit areas: aerobic capacity/endurance, gait, locomotion, and balance, muscle performance.    Plan:    Current Intervention: balance training, bed mobility training, gait training, patient/family education, strengthening, transfer training  To provide physical therapy services minimum of 2x/week  for duration of until discharge.    The risks/benefits of therapy have been discussed with the patient/caregiver and he/she is in agreement with the established plan of care.       Subjective & Objective        06/09/21 1010   Therapist Pager   PT Assigned/ Pager # Teresa Ramirez [phone]   Rehab Session   Document Type evaluation   Total PT Minutes: 25   Patient Effort good   Symptoms Noted During/After Treatment fatigue   General Information   Patient Profile Reviewed yes   Onset of Illness/Injury or Date of Surgery 06/06/21   Pertinent History of Current Functional Problem Teresa Ramirez is a female aged 80 y.o. who presents with N/V/D.  Patient has h/o epilepsy, hypothyroidism; also h/o acute liver pathology for which she was evaluated in [place]; she does not recall Dx or treatment, but her liver disease apparently improved.     Patient was seen here 10/20 for N/V & poor PO intake x 3 days following 2 weeks of headache & URI Sx.  She was noted to have hypokalemia, given Rx for ondansetron & potassium supplement.  Since then she continues to have N/V/D & poor PO intake.   Medical Lines PIV Line;Telemetry   Respiratory Status room air  Existing Precautions/Restrictions full code;fall precautions;contact isolation   General Observations of Patient contact II isolation   Mutuality/Individual Preferences   Individualized Care Needs OOB A X 1 to BSC/Chair   Plan of Care Reviewed With patient   Living [place] pt does not provide accurate living arrangement info. reports that she was staying with her mother prior to hospitalization but when questioned she indicates. "I might go stay with my boyfriend when I leave"   Functional Level Prior   Ambulation 0 - independent   Transferring 0 -  independent   Toileting 0 - independent   Bathing 0 - independent   Dressing 0 - independent   Eating 0 - independent   Prior Functional Level Comment reports PLOF indep w/o DME   Pre Treatment Status   Pre Treatment Patient Status Patient supine in bed;Call light within reach;Telephone within reach;Sitter select activated;Nurse approved session   Support Present Pre Treatment  None   Communication Pre Treatment  Nurse   Communication Pre Treatment Comment nurse aware of tx   Cognitive Assessment/Interventions   Behavior/Mood Observations alert;cooperative   Orientation Status oriented x 4   Attention WNL/WFL   Follows Commands follows multi-step commands   Vital Signs   O2 Delivery Pre Treatment room air   O2 Delivery Post Treatment room air   Vitals Comment vss   Pain Assessment   Pre/Posttreatment Pain Comment denied pain   RLE Assessment   RLE Assessment X-Exceptions   RLE Strength 4/5   LLE Assessment   LLE Assessment X-Exceptions   LLE Strength 4/5   Bed Mobility Assessment/Treatment   Bed Mobility, Assistive Device Head of Bed Elevated;bed rails   Supine-Sit Independence minimum assist (75% patient effort)   Safety Issues decreased use of legs for bridging/pushing;impaired trunk control for bed mobility   Impairments balance impaired;endurance;strength decreased   Transfer Assessment/Treatment   Sit-Stand Independence minimum assist (75% patient effort);verbal cues required   Stand-Sit Independence minimum assist (75% patient effort);verbal cues required   Sit-Stand-Sit, Assist Device handheld assist   Bed-Chair Independence minimum assist (75% patient effort)   Bed-Chair-Bed Assist Device handheld assist   Transfer Safety Issues balance decreased during turns;weight-shifting ability decreased;step length decreased   Transfer Impairments balance impaired;endurance;strength decreased   Gait Assessment/Treatment   Independence  minimum assist (75% patient effort)   Assistive Device  hand-held assistance    Distance in Feet 10x2   Gait Speed slow   Deviations  double stance time increased;increased trunk sway;stride length decreased;stride width increased   Safety Issues  balance decreased during turns;step length decreased;weight-shifting ability decreased   Impairments  balance impaired;endurance;strength decreased   Comment ambulated from one side of bed to the other and then following seated rest she stood at sink to brush teeth and then return ambulated back to other side of bed to chair   Balance Skill Training   Comment HH A X 1   Sitting Balance: Static fair balance   Sitting, Dynamic (Balance) fair - balance   Sit-to-Stand Balance fair - balance   Standing Balance: Static fair - balance   Standing Balance: Dynamic fair - balance   Systems Impairment Contributing to Balance Disturbance musculoskeletal   Identified Impairments Contributing to Balance Disturbance decreased strength  (endurance)   Therapeutic Exercise/Activity   Comment CG A for static standing at sink to brush teeth   Post Treatment Status   Post Treatment Patient Status Patient sitting in bedside chair or w/c;Call light within reach;Telephone  within reach;Engineer, water Comment performance   Plan of Care Review   Plan Of Care Reviewed With patient   Basic Mobility Am-PAC/6Clicks Score (APPROVED PT Staff, [facility] PT/OT, [facility] ONLY, [place], [place], and [facility])   Turning in bed without bedrails 3   Lying on back to sitting on edge of flat bed 3   Moving to and from a bed to a chair 3   Standing up from chair 3   Walk in room 3   Climbing 3-5 steps with railing 2   6 Clicks Raw Score total 17   Standardized (t-scale) score 39.67   CMS 0-100% Score 43.83   CMS Modifier CK   Patient Mobility Goal (JHHLM) 5- Stand 1 minute or more 3X/day   Exercise/Activity Level Performed 6- Walked 10 steps or more   Physical Therapy Clinical Impression    Assessment [name] is a 60 yo F seen for PT evaluation this date and tolerated fair. She was alert and cooperative throughout. Pt did not provide accurate living arrangement info when questioned. chart reports she lives with mother but when asked she stated "I was staying with her". PT asked about post d/c living arrangement and she reports "I may go stay with my boyfriend". does indicate that prior to admission she was fully indep w/o DME. at arrival she was supine in bed and transferred to EOB W/ Min A. at EOB she required Min A for sit to stand using [place] A and was able to ambulate ~10' to other side of bed w/ Min HH A. somewhat unsteady throughout gait and had to sit for rest following. after seated rest she was able to stand at sink with CGA in order to perform oral hygiene and then required [place] A to return ambulate back to other side of bed to chair. min a to transfer to chair. fatigued following but vitals were stable throughout. at this time pt is well below her functional baseline and will require IPR at d/c in order to return to full functional indep   Criteria for Skilled Therapeutic yes;meets criteria   Pathology/Pathophysiology Noted musculoskeletal;endocrine/metabolic   Impairments Found (describe specific impairments) aerobic capacity/endurance;gait, locomotion, and balance;muscle performance   Functional Limitations in Following  self-care;home management;community/leisure;work   Disability: Inability to Perform work;community/leisure   Rehab Potential good   Therapy Frequency minimum of 2x/week   Predicted Duration of Therapy Intervention (days/wks) until discharge   Anticipated Equipment Needs at Discharge (PT) none anticipated   Anticipated Discharge Disposition inpatient rehabilitation facility   Evaluation Complexity Justification   Patient History: Co-morbidity/factors that impact Plan of Care 3 or more that impact Plan of Care   Examination Components 3 or more Exam elements  addressed   Presentation Evolving: Symptoms, complaints, characteristics of condition changing &/or cognitive deficits present   Clinical Decision Making Moderate complexity   Evaluation Complexity Moderate complexity   Care Plan Goals   PT Rehab Goals Bed Mobility Goal 2;Gait Training Goal;Transfer Training Goal   Bed Mobility Goal 2   Bed Mobility Goal, Date Established 06/09/21   Bed Mobility Goal, Time to Achieve by discharge   Bed Mobility Goal, Activity Type all bed mobility activities   Bed Mobility Goal, Independence Level independent   Gait Training  Goal, Distance to Achieve   Gait Training  Goal, Date Established 06/09/21   Gait Training  Goal, Time to Achieve by discharge   Gait Training  Goal, Independence Level modified independence   Gait Training  Goal, Assist Device least restricted assistive device   Gait Training  Goal, Distance to Achieve community distance   Transfer Training Goal   Transfer Training Goal, Date Established 06/09/21   Transfer Training Goal, Time to Achieve by discharge   Transfer Training Goal, Activity Type all transfers   Transfer Training Goal, Independence Level independent   Planned Therapy Interventions, PT Eval   Planned Therapy Interventions (PT) balance training;bed mobility training;gait training;patient/family education;strengthening;transfer training       Therapist:   Elenor Quinones, PT

## 2021-06-09 NOTE — Care Plan (Signed)
Pt A&O, VS WNL, Telemetry shows NSR 70-80's. O2 saturations 90's on RA. Voiding spontaneously on her own using the bedpan. Pt continues on PO potassium TID. Pt continues on MIVF per physician orders. Bed in lowest position, call light within reach, all needs met at this time. Will continue to monitor throughout the shift.     Problem: Adult Inpatient Plan of Care  Goal: Plan of Care Review  Outcome: Ongoing (see interventions/notes)  Goal: Patient-Specific Goal (Individualized)  Outcome: Ongoing (see interventions/notes)  Flowsheets (Taken 06/09/2021 0800)  Individualized Care Needs: vanco, MIVF, monitor labs, potassium replacement  Anxieties, Fears or Concerns: none voiced  Goal: Absence of Hospital-Acquired Illness or Injury  Outcome: Ongoing (see interventions/notes)  Intervention: Identify and Manage Fall Risk  Recent Flowsheet Documentation  Taken 06/09/2021 0800 by Melina Schools, RN  Safety Promotion/Fall Prevention:   activity supervised   fall prevention program maintained   motion sensor pad activated   nonskid shoes/slippers when out of bed   safety round/check completed  Intervention: Prevent Skin Injury  Recent Flowsheet Documentation  Taken 06/09/2021 0800 by Melina Schools, RN  Body Position: supine, legs elevated  Skin Protection: adhesive use limited  Intervention: Prevent and Manage VTE (Venous Thromboembolism) Risk  Recent Flowsheet Documentation  Taken 06/09/2021 0800 by Melina Schools, RN  VTE Prevention/Management:   ambulation promoted   dorsiflexion/plantar flexion performed  Intervention: Prevent Infection  Recent Flowsheet Documentation  Taken 06/09/2021 0800 by Melina Schools, RN  Infection Prevention:   personal protective equipment utilized   promote handwashing   rest/sleep promoted   single patient room provided  Goal: Optimal Comfort and Wellbeing  Outcome: Ongoing (see interventions/notes)  Intervention: Provide Person-Centered Care  Recent Flowsheet Documentation  Taken 06/09/2021  0800 by Melina Schools, RN  Trust Relationship/Rapport:   care explained   choices provided   questions answered   questions encouraged   reassurance provided  Goal: Rounds/Family Conference  Outcome: Ongoing (see interventions/notes)     Problem: Health Knowledge, Opportunity to Enhance (Adult,Obstetrics,Pediatric)  Goal: Knowledgeable about Health Subject/Topic  Description: Patient will demonstrate the desired outcomes by discharge/transition of care.  Outcome: Ongoing (see interventions/notes)     Problem: Skin Injury Risk Increased  Goal: Skin Health and Integrity  Outcome: Ongoing (see interventions/notes)  Intervention: Optimize Skin Protection  Recent Flowsheet Documentation  Taken 06/09/2021 1600 by Melina Schools, RN  Pressure Reduction Techniques:   frequent weight shift encouraged   weight shift assistance provided  Pressure Reduction Devices: (L.M,H,VH) Pressure redistributing mattress utilized  Taken 06/09/2021 1200 by Melina Schools, RN  Pressure Reduction Techniques:   frequent weight shift encouraged   weight shift assistance provided  Pressure Reduction Devices: (L.M,H,VH) Pressure redistributing mattress utilized  Taken 06/09/2021 0800 by Melina Schools, RN  Pressure Reduction Techniques:   frequent weight shift encouraged   weight shift assistance provided  Pressure Reduction Devices: (L.M,H,VH) Pressure redistributing mattress utilized  Skin Protection: adhesive use limited  Activity Management:   activity adjusted per tolerance   activity clustered for rest periods  Head of Bed [facility]) Positioning: HOB elevated     Problem: Fall Injury Risk  Goal: Absence of Fall and Fall-Related Injury  Outcome: Ongoing (see interventions/notes)  Intervention: Identify and Manage Contributors  Recent Flowsheet Documentation  Taken 06/09/2021 0800 by Melina Schools, RN  Self-Care Promotion: independence encouraged  Intervention: Promote Injury-Free Environment  Recent Flowsheet Documentation  Taken  06/09/2021 0800 by Melina Schools, RN  Safety Promotion/Fall Prevention:   activity supervised   fall  prevention program maintained   motion sensor pad activated   nonskid shoes/slippers when out of bed   safety round/check completed     Problem: Oral Intake Inadequate  Goal: Improved Oral Intake  Outcome: Ongoing (see interventions/notes)

## 2021-06-09 NOTE — Progress Notes (Signed)
[facility]  Medicine Progress Note    Teresa Ramirez  Date of service: 06/09/2021  Date of admission: 06/06/2021   LOS: 3 days   Code status: Full Code    Subjective:  Overnight patient had less diarrhea.  No new complaints this AM.      Objective:  I/O:  I/O last 24 hours:      Intake/Output Summary (Last 24 hours) at 06/09/2021 1350  Last data filed at 06/09/2021 1000  Gross per 24 hour   Intake 240 ml   Output --   Net 240 ml     I/O current shift:  11/01 0700 - 11/01 1859  In: 240 [P.O.:240]  Out: -     Current Medications:  acetaminophen (TYLENOL) tablet, 650 mg, Oral, Q6H PRN  D5W 250 mL flush bag, , Intravenous, Q15 Min PRN  enoxaparin PF (LOVENOX) 40 mg/0.4 mL SubQ injection, 40 mg, Subcutaneous, Q24H  levETIRAcetam (KEPPRA) tablet, 500 mg, Oral, 2x/day  levothyroxine (SYNTHROID) tablet, 75 mcg, Oral, QAM  multivitamin (THERA) tablet, 1 Tablet, Oral, Daily  NS 1000 mL with potassium chloride 20 mEq premix infusion, , Intravenous, Continuous  NS 250 mL flush bag, , Intravenous, Q15 Min PRN  NS flush syringe, 2-6 mL, Intracatheter, Q8HRS  NS flush syringe, 2-6 mL, Intracatheter, Q1 MIN PRN  ondansetron (ZOFRAN) 2 mg/mL injection, 4 mg, Intravenous, Q8H PRN  potassium chloride (K-DUR) extended release tablet, 40 mEq, Oral, 3x/day-Meals  vancomycin (VANCOCIN) capsule, 125 mg, Oral, 4x/day  venlafaxine (EFFEXOR XR) 24 hr extended release capsule, 75 mg, Oral, Daily    Vital Signs:  Filed Vitals:    06/08/21 2328 06/09/21 0400 06/09/21 0423 06/09/21 1130   BP:  137/66  (!) 152/76   Pulse:  73  82   Resp:  (!) 22  (!) 24   Temp: 36.1 C (97 F)  36.4 C (97.6 F) 36.7 C (98.1 F)   SpO2: 100%        Physical Exam:  General: appears listed age; uncomfortable but improved from yesterday  Eyes: Sclera non-icteric.   HENT:Mouth mucous membranes moist.   Neck: supple, symmetrical, trachea midline  Lungs: normal respiratory effort  Cardiovascular:    warm & well perfused  Abdomen: non-distended  Extremities:  No cyanosis or edema  Skin: Skin warm and dry    Labs:  CBC Results Differential Results   Recent Results (from the past 30 hour(s))   CBC WITH DIFF    Collection Time: 06/09/21  5:29 AM   Result Value    WBC 12.3 (H)    HGB 8.3 (L)    HCT 25.5 (L)    PLATELETS 259    Recent Results (from the past 30 hour(s))   CBC WITH DIFF    Collection Time: 06/09/21  5:29 AM   Result Value    WBC 12.3 (H)    NEUTROPHIL % 65    MONOCYTE % 8    BASOPHIL % 0    BASOPHIL # <0.04      BMP Results Other Chemistries Results   Results for orders placed or performed during the hospital encounter of 06/06/21 (from the past 30 hour(s))   BASIC METABOLIC PANEL    Collection Time: 06/09/21  5:29 AM   Result Value    SODIUM 143    POTASSIUM 2.9 (L)    CHLORIDE 123 (H)    CO2 TOTAL 15 (L)    GLUCOSE 104    BUN 5 (L)  CREATININE 0.71    Recent Results (from the past 30 hour(s))   MAGNESIUM    Collection Time: 06/09/21  5:29 AM   Result Value    MAGNESIUM 2.0      Liver/Pancreas Enzyme Results Liver Function Results   No results found for this or any previous visit (from the past 30 hour(s)). No results found for this or any previous visit (from the past 30 hour(s)).   Cardiac Results Coags Results   No results found for this or any previous visit (from the past 30 hour(s)). No results found for this or any previous visit (from the past 30 hour(s)).     Microbiology:    No results found for any visits on 06/06/21 (from the past 24 hour(s)).     Radiology:  No new results today      Assessment:  Active Hospital Problems    Diagnosis   . Primary Problem: Hypokalemia   . C. difficile diarrhea   . Hypercalcemia   . Nausea vomiting and diarrhea   . Metabolic acidosis, increased anion gap (IAG)   . Metabolic acidosis, normal anion gap (NAG)   . Hematuria   . Hypothyroidism   . Epilepsy (CMS [place])     Teresa Ramirez is a female aged 60 y.o. with h/o hypothyroidism, epilepsy, & recent acute liver illness of unclear etiology who was admitted 06/06/2021  for N/V/D with severe metabolic derangements including hypercalcemia, hypokalemia, hyperammonemia, metabolic acidosis, & AKI.  Presentation also notable for prolonged QT.  Hospital course notable for improving hypercalcemia, AKI, & QT interval; hypokalemia also improving, but more slowly.  She was found to have Cdiff as etiology of diarrhea.  Currently she is improving.      Plan:  1. Hypokalemia, improving  N/V/D, improving  Metabolic Acidosis, improving  Prolonged QTc, improved  AKI, improved  Serum K this AM 2.9 from 1.9 yesterday, 1.2 on presentation.  HCO3 15 from 12 yesterday, 10 on presentation, Cr 0.71 from 0.84 yesterday, 1.73 on presentation.  VBG with pH 7.24 on presentation, 7.29 today.  Blood gas & urine electrolytes consistent with multiple sources of metabolic acidosis, including at least one IAG & at least one NAG process; contributions include diarrheal loss of bicarbonate, elevated lactic acid.  QT now improved.  - continue potassium 52mEq PO TID  - continue IVF with potassium  - will follow BMP this afternoon  - continue ondansetron PRN for nausea    2. Clostridial Diarrhea  Cdiff toxin positive 10/30.  - continue vancomycin PO (last dose planned 11/09)    3. Hypercalcemia, improved  Serum Ca today 8.7 from 13.4 on presentation.  PTH 20.8, most consistent with non-PTH-mediated etiology (differential including malignancy, other).  Vitamin D (25-OH) low at 12.  - PTHRP & Vitamin D 1,25-OH sent, pending results    4. Hyperammonemia, improved  Serum NH3 106 on presentation, improved to 46.  Etiology unclear, hepatic function panel otherwise unimpressive.  US liver with heterogenous hepatic echotexture, but otherwise not diagnostic.  - no acute interventions given improvement    Chronic Medical Conditions  Epilepsy: continue home levetiracetam  Hypothyroidism: continue home levothyroxine    Hardware (lines, foley's, tubes):  Patient Lines/Drains/Airways Status     Active Line / Dialysis Catheter /  Dialysis Graft / Drain / Airway / Wound     Name Placement date Placement time Site Days    Peripheral IV Right Median Cubital  (antecubital fossa) 06/06/21  1220  -- 3  Peripheral IV Ultrasound guided;Extended dwell catheter Left Median Cubital  (antecubital fossa) 06/07/21  2344  -- 1              Consults: none  Diet: regular  Activity: up in chair BID  BM: Date of Last Bowel Movement: 06/08/21  PT/OT: Yes, recommended for inpatient rehab  DVT/PE Prophylaxis: Enoxaparin  Disposition Planning: TBD   Family Update: none given today    Neysa Bonito, MD  06/09/21 13:50  Assistant Professor, Internal Medicine  [facility]

## 2021-06-09 NOTE — Care Plan (Signed)
K+ level went from 2.8 to 2.5, PO potassium given and IV potassium runs x4.  Problem: Adult Inpatient Plan of Care  Goal: Plan of Care Review  Outcome: Ongoing (see interventions/notes)  Goal: Patient-Specific Goal (Individualized)  Recent Flowsheet Documentation  Taken 06/08/2021 2000 by Wandra Arthurs, RN  Individualized Care Needs: Potassium replacements  Anxieties, Fears or Concerns: States none  Goal: Absence of Hospital-Acquired Illness or Injury  Intervention: Identify and Manage Fall Risk  Recent Flowsheet Documentation  Taken 06/09/2021 0200 by Wandra Arthurs, RN  Safety Promotion/Fall Prevention: safety round/check completed  Taken 06/09/2021 0000 by Wandra Arthurs, RN  Safety Promotion/Fall Prevention: safety round/check completed  Taken 06/08/2021 2200 by Wandra Arthurs, RN  Safety Promotion/Fall Prevention: safety round/check completed  Taken 06/08/2021 2000 by Wandra Arthurs, RN  Safety Promotion/Fall Prevention: safety round/check completed  Intervention: Prevent Skin Injury  Recent Flowsheet Documentation  Taken 06/08/2021 2000 by Wandra Arthurs, RN  Body Position: supine, head elevated  Skin Protection: adhesive use limited  Goal: Optimal Comfort and Wellbeing  Intervention: Provide Person-Centered Care  Recent Flowsheet Documentation  Taken 06/08/2021 2000 by Wandra Arthurs, RN  Trust Relationship/Rapport: care explained     Problem: Health Knowledge, Opportunity to Enhance (Adult,Obstetrics,Pediatric)  Goal: Knowledgeable about Health Subject/Topic  Description: Patient will demonstrate the desired outcomes by discharge/transition of care.  Outcome: Ongoing (see interventions/notes)  Intervention: Enhance Health Knowledge  Recent Flowsheet Documentation  Taken 06/08/2021 2000 by Wandra Arthurs, RN  Supportive Measures: active listening utilized  Intervention: Enhance Health Knowledge  Recent Flowsheet Documentation  Taken 06/08/2021 2000 by Wandra Arthurs, RN  Supportive  Measures: active listening utilized     Problem: Skin Injury Risk Increased  Goal: Skin Health and Integrity  Outcome: Ongoing (see interventions/notes)  Intervention: Optimize Skin Protection  Recent Flowsheet Documentation  Taken 06/08/2021 2000 by Wandra Arthurs, RN  Skin Protection: adhesive use limited  Activity Management: activity adjusted per tolerance  Head of Bed [facility]) Positioning: HOB elevated     Problem: Fall Injury Risk  Goal: Absence of Fall and Fall-Related Injury  Outcome: Ongoing (see interventions/notes)  Intervention: Promote Injury-Free Environment  Recent Flowsheet Documentation  Taken 06/09/2021 0200 by Wandra Arthurs, RN  Safety Promotion/Fall Prevention: safety round/check completed  Taken 06/09/2021 0000 by Wandra Arthurs, RN  Safety Promotion/Fall Prevention: safety round/check completed  Taken 06/08/2021 2200 by Wandra Arthurs, RN  Safety Promotion/Fall Prevention: safety round/check completed  Taken 06/08/2021 2000 by Wandra Arthurs, RN  Safety Promotion/Fall Prevention: safety round/check completed     Problem: Oral Intake Inadequate  Goal: Improved Oral Intake  Outcome: Ongoing (see interventions/notes)

## 2021-06-10 DIAGNOSIS — E7220 Disorder of urea cycle metabolism, unspecified: Secondary | ICD-10-CM

## 2021-06-10 LAB — CBC WITH DIFF
BASOPHIL #: <0.04 10*3/uL (ref <=0.20)
BASOPHIL %: 0 %
EOSINOPHIL #: <0.04 10*3/uL (ref <=0.50)
EOSINOPHIL %: 0 %
HCT: 25.8 % — ABNORMAL LOW (ref 34.8–46.0)
HGB: 8.3 g/dL — ABNORMAL LOW (ref 11.5–16.0)
IMMATURE GRANULOCYTE #: 0.07 10*3/uL (ref <0.10)
IMMATURE GRANULOCYTE %: 1 % (ref 0–1)
LYMPHOCYTE #: 2.56 10*3/uL (ref 1.00–4.80)
LYMPHOCYTE %: 29 %
MCH: 25.3 pg — ABNORMAL LOW (ref 26.0–32.0)
MCHC: 32.2 g/dL (ref 31.0–35.5)
MCV: 78.7 fL (ref 78.0–100.0)
MONOCYTE #: 0.9 10*3/uL (ref 0.20–1.10)
MONOCYTE %: 10 %
MPV: 11.6 fL (ref 8.7–12.5)
NEUTROPHIL #: 5.25 10*3/uL (ref 1.50–7.70)
NEUTROPHIL %: 60 %
PLATELETS: 238 10*3/uL (ref 150–400)
RBC: 3.28 10*6/uL — ABNORMAL LOW (ref 3.85–5.22)
RDW-CV: 19.7 % — ABNORMAL HIGH (ref 11.5–15.5)
WBC: 8.8 10*3/uL

## 2021-06-10 LAB — BASIC METABOLIC PANEL
ANION GAP: 3 mmol/L — ABNORMAL LOW (ref 4–13)
BUN/CREA RATIO: 4 — ABNORMAL LOW (ref 6–22)
BUN: 3 mg/dL — ABNORMAL LOW (ref 8–25)
CALCIUM: 8.9 mg/dL (ref 8.5–10.2)
CHLORIDE: 120 mmol/L — ABNORMAL HIGH (ref 96–111)
CO2 TOTAL: 18 mmol/L — ABNORMAL LOW (ref 22–32)
CREATININE: 0.67 mg/dL (ref 0.49–1.10)
ESTIMATED GFR: >60 mL/min/{1.73_m2} (ref >60)
GLUCOSE: 87 mg/dL (ref 65–125)
POTASSIUM: 2.8 mmol/L — CL (ref 3.5–5.1)
SODIUM: 141 mmol/L (ref 136–145)

## 2021-06-10 LAB — VITAMIN D CALCITROL, 1,25 HYDROXYVITAMIN D
VITAMIN D,1,25 (OH)2,TOTAL: 13 pg/mL — ABNORMAL LOW (ref 18–72)
VITAMIN D2, 1,25 (OH)2: <8 pg/mL
VITAMIN D3, 1,25 (OH)2: 13 pg/mL

## 2021-06-10 LAB — MAGNESIUM: MAGNESIUM: 1.8 mg/dL (ref 1.6–2.6)

## 2021-06-10 LAB — LEVETIRACETAM, SERUM: KEPPRA: 11.5 ug/mL (ref 6.0–46.0)

## 2021-06-10 LAB — POTASSIUM: POTASSIUM: 3.2 mmol/L — ABNORMAL LOW (ref 3.5–5.1)

## 2021-06-10 NOTE — Progress Notes (Signed)
[facility]  Medicine Progress Note    Teresa Ramirez  Date of service: 06/10/2021  Date of admission: 06/06/2021   LOS: 4 days   Code status: Full Code    Subjective:  She states she is still having multiple bowel movements/diarrhea overnight but overall feeling a little bit better. Per nursing staff her color is better this morning, improving from being pale earlier in her hospitalization. She has no additional complaints.    Discussed plan of care with patient and nursing staff  - recheck potassium at 2 PM and we can give additional potassium if needed  - continue vancomycin  - continue monitoring for improvement in her diarrhea.      Objective:  I/O:  I/O last 24 hours:      Intake/Output Summary (Last 24 hours) at 06/10/2021 0731  Last data filed at 06/09/2021 1206  Gross per 24 hour   Intake 600 ml   Output --   Net 600 ml     I/O current shift:  No intake/output data recorded.    Current Medications:  acetaminophen (TYLENOL) tablet, 650 mg, Oral, Q6H PRN  D5W 250 mL flush bag, , Intravenous, Q15 Min PRN  enoxaparin PF (LOVENOX) 40 mg/0.4 mL SubQ injection, 40 mg, Subcutaneous, Q24H  levETIRAcetam (KEPPRA) tablet, 500 mg, Oral, 2x/day  levothyroxine (SYNTHROID) tablet, 75 mcg, Oral, QAM  multivitamin (THERA) tablet, 1 Tablet, Oral, Daily  NS 1000 mL with potassium chloride 20 mEq premix infusion, , Intravenous, Continuous  NS 250 mL flush bag, , Intravenous, Q15 Min PRN  NS flush syringe, 2-6 mL, Intracatheter, Q8HRS  NS flush syringe, 2-6 mL, Intracatheter, Q1 MIN PRN  ondansetron (ZOFRAN) 2 mg/mL injection, 4 mg, Intravenous, Q8H PRN  potassium chloride (K-DUR) extended release tablet, 40 mEq, Oral, 3x/day-Meals  vancomycin (VANCOCIN) capsule, 125 mg, Oral, 4x/day  venlafaxine (EFFEXOR XR) 24 hr extended release capsule, 75 mg, Oral, Daily    Vital Signs:  Filed Vitals:    06/09/21 0423 06/09/21 1130 06/09/21 2100 06/10/21 0031   BP:  (!) 152/76 (!) 147/82 135/65   Pulse:  82 79 79   Resp:  (!) 24  (!) 24 13   Temp: 36.4 C (97.6 F) 36.7 C (98.1 F) 36 C (96.8 F) 36.4 C (97.5 F)   SpO2:   98% 100%     Physical Exam:   General:Cooperative female in no acute distress. VS reviewed  HEENT: normocephalic, atraumatic  Lungs: Clear to auscultation bilaterally. No crackles, rales or wheezing   Cardiovascular: regular rate and rhythm, S1, S2 normal, no murmur, click, rub or gallop   Abdomen: Soft, non-tender, Bowel sounds normal, non-distended, No hepatosplenomegaly   Extremities: No cyanosis or edema  Neurologic: Alert and oriented x3  Skin: no rashes or lesions  Psychiatric: Normal Affect      Labs:  CBC Results Differential Results   Recent Results (from the past 30 hour(s))   CBC WITH DIFF    Collection Time: 06/10/21  5:15 AM   Result Value    WBC 8.8    HGB 8.3 (L)    HCT 25.8 (L)    PLATELETS 238    Recent Results (from the past 30 hour(s))   CBC WITH DIFF    Collection Time: 06/10/21  5:15 AM   Result Value    WBC 8.8    NEUTROPHIL % 60    MONOCYTE % 10    BASOPHIL % 0    BASOPHIL # <0.04  BMP Results Other Chemistries Results   Results for orders placed or performed during the hospital encounter of 06/06/21 (from the past 30 hour(s))   BASIC METABOLIC PANEL    Collection Time: 06/10/21  5:15 AM   Result Value    SODIUM 141    POTASSIUM 2.8 (LL)    CHLORIDE 120 (H)    CO2 TOTAL 18 (L)    GLUCOSE 87    BUN 3 (L)    CREATININE 0.67    Recent Results (from the past 30 hour(s))   MAGNESIUM    Collection Time: 06/10/21  5:15 AM   Result Value    MAGNESIUM 1.8      Liver/Pancreas Enzyme Results Liver Function Results   No results found for this or any previous visit (from the past 30 hour(s)). No results found for this or any previous visit (from the past 30 hour(s)).   Cardiac Results Coags Results   No results found for this or any previous visit (from the past 30 hour(s)). No results found for this or any previous visit (from the past 30 hour(s)).     Microbiology:    No results found for any visits on  06/06/21 (from the past 24 hour(s)).     Radiology:  No new results today      Assessment:  Active Hospital Problems    Diagnosis   . Primary Problem: Hypokalemia   . C. difficile diarrhea   . Hypercalcemia   . Nausea vomiting and diarrhea   . Metabolic acidosis, increased anion gap (IAG)   . Metabolic acidosis, normal anion gap (NAG)   . Hematuria   . Hypothyroidism   . Epilepsy (CMS [place])     Teresa Ramirez is a female aged 60 y.o. with h/o hypothyroidism, epilepsy, & recent acute liver illness of unclear etiology who was admitted 06/06/2021 for N/V/D with severe metabolic derangements including hypercalcemia, hypokalemia, hyperammonemia, metabolic acidosis, & AKI.  Presentation also notable for prolonged QT.  Hospital course notable for improving hypercalcemia, AKI, & QT interval; hypokalemia also improving, but more slowly.  She was found to have Cdiff as etiology of diarrhea.  Currently she is improving.      Plan:  Hypokalemia, improving  N/V/D, improving  Metabolic Acidosis, improving  Prolonged QTc, improved  AKI, improved  - Potassium 2.8 this morning  - continue potassium 22mEq PO TID  - continue IVF with potassium  - follow BMP; next check 2:00 PM. Can give additional potassium at that time if still low.  - continue ondansetron PRN for nausea    Clostridial Diarrhea  Cdiff toxin positive 10/30.  - continue vancomycin PO (last dose planned 11/09)    Hypercalcemia, resolved  - Ca 8.9 today    Hyperammonemia, improved  - resolved    Chronic Medical Conditions  Epilepsy: continue home levetiracetam  Hypothyroidism: continue home levothyroxine      DVT/PE Prophylaxis: Enoxaparin  Disposition Planning: TBD     Teresa Rucks, MD 06/10/2021 07:33  Hospitalist  Department of Internal Medicine  [facility]

## 2021-06-10 NOTE — Care Plan (Signed)
[place]  [facility]  Physical Therapy Progress Note    Patient Name: KRYSTLE POLCYN  Date of Birth: 02-02-1961  Height:  167.6 cm (5\' 6" )  Weight:  64.7 kg (142 lb 10.2 oz)  Room/Bed: 17/A  Payor: [employer] MEDICARE / Plan: [employer] CHOICE PPO / Product Type: PPO /     Assessment:     Delancey was seen for PT tx and performed better this date than previous session. overall tolerance to activity much improved as well. She was supine at arrival and transferred to EOB w/ CG A. at EOB she was able to perform sit to stand w/o AD w/ CG A. stood at sink to perform hygiene activity w/ SB A and then ambulated short distance to [facility] CG A for transfers and was indep w/ hygiene following BM. then ambulated back to sink w/ CG A and stood to perform hand hygiene w/ SB A. ambulated to door w/ CG A And then back to bedside chair. much more steady throughout. vitals stable for duration. still somewhat fatigued following through. RN updated. cont to rec IPR at d/c.    Discharge Needs:   Equipment Recommendation: none anticipated    Discharge Disposition: inpatient rehabilitation facility    JUSTIFICATION OF DISCHARGE RECOMMENDATION   Based on current diagnosis, functional performance prior to admission, and current functional performance, this patient requires continued PT services in inpatient rehabilitation facility in order to achieve significant functional improvements in these deficit areas: aerobic capacity/endurance, gait, locomotion, and balance, muscle performance.      Plan:   Continue to follow patient according to established plan of care.  The risks/benefits of therapy have been discussed with the patient/caregiver and he/she is in agreement with the established plan of care.     Subjective & Objective:        06/10/21 1045   Therapist Pager   PT Assigned/ Pager # Mortimer Fries [phone]   Rehab Session   Document Type therapy progress note (daily note)   Total PT Minutes: 20   Patient Effort good    Symptoms Noted During/After Treatment fatigue   General Information   Patient Profile Reviewed yes   Medical Lines PIV Line;Telemetry   Respiratory Status room air   Existing Precautions/Restrictions full code;fall precautions;contact isolation   General Observations of Patient contact II iso   Mutuality/Individual Preferences   Individualized Care Needs OOB A X 1   Plan of Care Reviewed With patient   Pre Treatment Status   Pre Treatment Patient Status Patient supine in bed;Call light within reach;Telephone within reach;Sitter select activated;Nurse approved session   Support Present Pre Treatment  None   Communication Pre Treatment  Nurse   Communication Pre Treatment Comment nurse approved treatment   Cognitive Assessment/Interventions   Behavior/Mood Observations alert;cooperative   Attention WNL/WFL   Follows Commands follows one step commands   Vital Signs   O2 Delivery Pre Treatment room air   O2 Delivery Post Treatment room air   Vitals Comment vss   Pain Assessment   Pre/Posttreatment Pain Comment none   Bed Mobility Assessment/Treatment   Bed Mobility, Assistive Device Head of Bed Elevated   Supine-Sit [place] contact guard assist   Safety Issues decreased use of legs for bridging/pushing;impaired trunk control for bed mobility   Impairments balance impaired;endurance;strength decreased   Transfer Assessment/Treatment   Sit-Stand [place] contact guard assist   Stand-Sit Independence contact guard assist   Bed-Chair [place] contact guard assist  Chair-Bed Independence contact guard assist   Toilet Transfer Independence contact guard assist   Transfer Safety Issues balance decreased during turns;weight-shifting ability decreased   Transfer Impairments balance impaired;endurance;strength decreased   Gait Assessment/Treatment   Independence  contact guard assist   Distance in Feet limited household distance   Gait Speed mildly slowed   Deviations  double stance time increased;stride length  decreased   Safety Issues  balance decreased during turns   Impairments  balance impaired;endurance;strength decreased   Balance Skill Training   Comment unsupported   Sitting Balance: Static fair + balance   Sitting, Dynamic (Balance) fair balance   Sit-to-Stand Balance fair balance   Standing Balance: Static fair balance   Standing Balance: Dynamic fair balance   Systems Impairment Contributing to Balance Disturbance musculoskeletal   Post Treatment Status   Post Treatment Patient Status Patient sitting in bedside chair or w/c;Call light within reach;Telephone within reach;Sitter select activated   Support Present Social worker   Plan of Care Review   Plan Of Care Reviewed With patient   Basic Mobility Am-PAC/6Clicks Score (APPROVED PT Staff, [facility] PT/OT, [facility] Nursing ONLY, [place], [place], and F[facility])   Turning in bed without bedrails 4   Lying on back to sitting on edge of flat bed 3   Moving to and from a bed to a chair 3   Standing up from chair 3   Walk in room 3   Climbing 3-5 steps with railing 3   6 Clicks Raw Score total 19   Standardized (t-scale) score 42.48   CMS 0-100% Score 36.99   CMS Modifier CJ   Patient Mobility Goal (JHHLM) 6- Walk 10 steps or more 2X/day   Exercise/Activity Level Performed 7- Walked 25 feet or more   Physical Therapy Clinical Impression   Assessment Tereza was seen for PT tx and performed better this date than previous session. overall tolerance to activity much improved as well. She was supine at arrival and transferred to EOB w/ CG A. at EOB she was able to perform sit to stand w/o AD w/ CG A. stood at sink to perform hygiene activity w/ SB A and then ambulated short distance to [facility]. CG A for transfers and was indep w/ hygiene following BM. then ambulated back to sink w/ CG A and stood to perform hand hygiene w/ SB A. ambulated to door w/ CG A And then back to bedside chair. much more steady throughout. vitals stable for duration. still  somewhat fatigued following through. RN updated. cont to rec IPR at d/c.   Therapy Frequency minimum of 2x/week   Predicted Duration of Therapy Intervention (days/wks) until discharge   Anticipated Equipment Needs at Discharge (PT) none anticipated   Anticipated Discharge Disposition inpatient rehabilitation facility       Therapist:   Elenor Quinones, PT

## 2021-06-10 NOTE — Care Plan (Signed)
Patient has rested off and on tonight.  VSS.  No lab draws until the morning.  Alert and oriented x4.  Able to get OOB to [facility] with 1 assist.  Fecal incontinence at times.        Problem: Adult Inpatient Plan of Care  Goal: Plan of Care Review  Outcome: Ongoing (see interventions/notes)  Goal: Patient-Specific Goal (Individualized)  Outcome: Ongoing (see interventions/notes)  Flowsheets (Taken 06/09/2021 1945)  Individualized Care Needs: OOB x1 assist, labs, meds  Anxieties, Fears or Concerns: none voiced "feeling better"  Goal: Absence of Hospital-Acquired Illness or Injury  Outcome: Ongoing (see interventions/notes)  Intervention: Identify and Manage Fall Risk  Recent Flowsheet Documentation  Taken 06/10/2021 0400 by Melford Aase, RN  Safety Promotion/Fall Prevention: (Simultaneous filing. User may be unaware of other data.) safety round/check completed  Taken 06/10/2021 0200 by Melford Aase, RN  Safety Promotion/Fall Prevention: safety round/check completed  Taken 06/10/2021 0000 by Melford Aase, RN  Safety Promotion/Fall Prevention: safety round/check completed  Taken 06/09/2021 2200 by Melford Aase, RN  Safety Promotion/Fall Prevention: safety round/check completed  Taken 06/09/2021 1945 by Melford Aase, RN  Safety Promotion/Fall Prevention: safety round/check completed  Intervention: Prevent Skin Injury  Recent Flowsheet Documentation  Taken 06/09/2021 1945 by Melford Aase, RN  Body Position: semi-fowlers (less than 30 degrees)  Skin Protection: adhesive use limited  Intervention: Prevent and Manage VTE (Venous Thromboembolism) Risk  Recent Flowsheet Documentation  Taken 06/09/2021 1945 by Melford Aase, RN  VTE Prevention/Management: ambulation promoted  Intervention: Prevent Infection  Recent Flowsheet Documentation  Taken 06/09/2021 1945 by Melford Aase, RN  Infection Prevention: single patient room provided  Goal: Optimal Comfort and Wellbeing  Outcome: Ongoing (see interventions/notes)  Intervention: Provide  Person-Centered Care  Recent Flowsheet Documentation  Taken 06/09/2021 1945 by Melford Aase, RN  Trust Relationship/Rapport:   care explained   choices provided   questions encouraged   questions answered  Goal: Rounds/Family Conference  Outcome: Ongoing (see interventions/notes)

## 2021-06-10 NOTE — Care Plan (Signed)
Pt A&O, VS WNL, Telemetry shows NSR 70-80's. O2 saturations 90's on RA. Voiding spontaneously on her own using the bedpan. Pt continues on PO potassium TID. Pt continues on MIVF per physician orders. Bed in lowest position, call light within reach, all needs met at this time. Will continue to monitor throughout the shift.      Problem: Adult Inpatient Plan of Care  Goal: Plan of Care Review  Outcome: Ongoing (see interventions/notes)  Goal: Patient-Specific Goal (Individualized)  Outcome: Ongoing (see interventions/notes)  Flowsheets (Taken 06/10/2021 0800)  Individualized Care Needs: monitor labs, potassium  Anxieties, Fears or Concerns: none voiced  Goal: Absence of Hospital-Acquired Illness or Injury  Outcome: Ongoing (see interventions/notes)  Intervention: Identify and Manage Fall Risk  Recent Flowsheet Documentation  Taken 06/10/2021 0800 by Melina Schools, RN  Safety Promotion/Fall Prevention:   activity supervised   fall prevention program maintained   nonskid shoes/slippers when out of bed   safety round/check completed   motion sensor pad activated  Intervention: Prevent Skin Injury  Recent Flowsheet Documentation  Taken 06/10/2021 0800 by Melina Schools, RN  Body Position: high-fowlers (greater than 60 degrees)  Skin Protection: adhesive use limited  Intervention: Prevent and Manage VTE (Venous Thromboembolism) Risk  Recent Flowsheet Documentation  Taken 06/10/2021 0800 by Melina Schools, RN  VTE Prevention/Management:   ambulation promoted   dorsiflexion/plantar flexion performed  Intervention: Prevent Infection  Recent Flowsheet Documentation  Taken 06/10/2021 0800 by Melina Schools, RN  Infection Prevention:   personal protective equipment utilized   promote handwashing   rest/sleep promoted   single patient room provided  Goal: Optimal Comfort and Wellbeing  Outcome: Ongoing (see interventions/notes)  Intervention: Provide Person-Centered Care  Recent Flowsheet Documentation  Taken 06/10/2021 0800  by Melina Schools, RN  Trust Relationship/Rapport:   care explained   choices provided   questions answered   questions encouraged   reassurance provided  Goal: Rounds/Family Conference  Outcome: Ongoing (see interventions/notes)     Problem: Health Knowledge, Opportunity to Enhance (Adult,Obstetrics,Pediatric)  Goal: Knowledgeable about Health Subject/Topic  Description: Patient will demonstrate the desired outcomes by discharge/transition of care.  Outcome: Ongoing (see interventions/notes)     Problem: Skin Injury Risk Increased  Goal: Skin Health and Integrity  Outcome: Ongoing (see interventions/notes)  Intervention: Optimize Skin Protection  Recent Flowsheet Documentation  Taken 06/10/2021 1200 by Melina Schools, RN  Pressure Reduction Techniques:   frequent weight shift encouraged   weight shift assistance provided  Pressure Reduction Devices: (L.M,H,VH) Pressure redistributing mattress utilized  Taken 06/10/2021 0800 by Melina Schools, RN  Pressure Reduction Techniques:   frequent weight shift encouraged   weight shift assistance provided  Pressure Reduction Devices: (L.M,H,VH) Pressure redistributing mattress utilized  Skin Protection: adhesive use limited  Activity Management:   activity adjusted per tolerance   activity clustered for rest periods  Head of Bed [facility]) Positioning: HOB elevated     Problem: Fall Injury Risk  Goal: Absence of Fall and Fall-Related Injury  Outcome: Ongoing (see interventions/notes)  Intervention: Identify and Manage Contributors  Recent Flowsheet Documentation  Taken 06/10/2021 0800 by Melina Schools, RN  Self-Care Promotion: independence encouraged  Intervention: Promote Injury-Free Environment  Recent Flowsheet Documentation  Taken 06/10/2021 0800 by Melina Schools, RN  Safety Promotion/Fall Prevention:   activity supervised   fall prevention program maintained   nonskid shoes/slippers when out of bed   safety round/check completed   motion sensor pad activated      Problem: Oral Intake Inadequate  Goal: Improved  Oral Intake  Outcome: Ongoing (see interventions/notes)

## 2021-06-10 NOTE — Care Plan (Signed)
[facility]  Occupational Therapy Progress Note    Patient Name: Teresa Ramirez  Date of Birth: 1961-04-26  Height:  167.6 cm (5' 6")  Weight:  64.7 kg (142 lb 10.2 oz)  Room/Bed: 17/A  Payor: HUMANA MEDICARE / Plan: HUMANA CHOICE PPO / Product Type: PPO /     Assessment:    Ms. Johnsen tolerated OT treatment well. Patient demonstrating improvement with bed mobility, functional transfers, and functional mobility distance. Patient continues to demosntrate deficits in ADLs and mobility due to impairments in strength, balance, and activity tolerance. Patient continues to be below baseline independence and requires CGA-minA for ADLs, functional transfers, and >household distance functional mobility. Continue to recommend additional therapy in inpatient rehab at d/c.      Discharge Needs:   Equipment Recommendation: to be determined  Discharge Disposition:  inpatient rehabilitation facility   JUSTIFICATION OF DISCHARGE RECOMMENDATION   Based on current diagnosis, functional performance prior to admission, and current functional performance, this patient requires continued OT services in inpatient rehabilitation facility in order to achieve significant functional improvements.  The above recommendation is based upon the current examination and evaluation performed on this date. As subsequent sessions are completed, recommendations will be updated accordingly.    Plan:   Continue to follow patient according to established plan of care.  The risks/benefits of therapy have been discussed with the patient/caregiver and he/she is in agreement with the established plan of care.     Subjective & Objective:      06/10/21 1027   Therapist Pager   OT Assigned/ Pager # Marks Scalera [phone]   Rehab Session   Document Type therapy progress note (daily note)   Total OT Minutes: 24   Patient Effort good   Symptoms Noted During/After Treatment fatigue   General Information   Patient Profile Reviewed yes   General  Observations of Patient patient agreeable to OT, seen along with professional assist of PT for clustering care.   Medical Lines PIV Line;Telemetry   Respiratory Status room air   Existing Precautions/Restrictions contact isolation;fall precautions;full code  (c-diff)   Pre Treatment Status   Pre Treatment Patient Status Patient supine in bed;Call light within reach;Telephone within reach;Sitter select activated;Nurse approved session   Support Present Pre Treatment  None   Communication Pre Treatment  Nurse   Mutuality/Individual Preferences   Individualized Care Needs OOB with A x 1; increase activity within room   Living Farmer reports mothers home is 1 STE and 1 level, but mother would not provide any assistance. Boyfriends home is 1STE with split level entry, all needs met once up inside stairs. Boyfriend works during the day, would provide assistance as needed when home.   Pain Assessment   Pre/Posttreatment Pain Comment denies pain   Coping/Psychosocial   Observed Emotional State calm;cooperative   Coping/Psychosocial Response Interventions   Plan Of Care Reviewed With patient   Cognitive Assessment/Interventions   Behavior/Mood Observations alert;cooperative   Orientation Status oriented x 4   Attention WNL/WFL   Follows Commands WFL   Mobility Assessment/Training   Mobility Comment functional mobility ~10' x 2 and ~20' x 1 with CGA and VCs. seated rest breaks required throughout   Bed Mobility Assessment/Treatment   Supine-Sit Independence supervision required;verbal cues required   Transfer Assessment/Treatment   Sit-Stand Independence supervision required;verbal cues required   Stand-Sit Independence supervision required;verbal cues required   Bed-Chair Independence contact guard assist  (BSC)  Chair-Bed Independence contact guard assist  (BSC)   Bed-Chair-Bed Assist Device handheld assist   Lower Body Dressing Assessment/Training   Independence Level  contact guard  assist;verbal cues required   Toileting Assessment/Training   Independence Level  contact guard assist;verbal cues required   Grooming Assessment/Training   Position standing   Independence Level contact guard assist;verbal cues required   Balance Skill Training   Sitting Balance: Static fair + balance   Sitting, Dynamic (Balance) fair balance   Sit-to-Stand Balance fair balance   Standing Balance: Static fair balance   Standing Balance: Dynamic fair - balance   Post Treatment Status   Post Treatment Patient Status Patient sitting in bedside chair or w/c;Call light within reach;Telephone within reach;Sitter select activated   Support Present Post Treatment  None   Financial trader Nurse   Clinical Impression   Functional Level at Time of Session Ms. Halladay tolerated OT treatment well. Patient demonstrating improvement with bed mobility, functional transfers, and functional mobility distance. Patient continues to demosntrate deficits in ADLs and mobility due to impairments in strength, balance, and activity tolerance. Patient continues to be below baseline independence and requires CGA-minA for ADLs, functional transfers, and >household distance functional mobility. Continue to recommend additional therapy in inpatient rehab at d/c.   Anticipated Equipment Needs at Discharge to be determined   Anticipated Discharge Disposition inpatient rehabilitation facility       Therapist:   Shantea Poulton L. Crissie Figures, OTR/L  Pager #: [phone]

## 2021-06-11 DIAGNOSIS — E039 Hypothyroidism, unspecified: Secondary | ICD-10-CM

## 2021-06-11 DIAGNOSIS — G40909 Epilepsy, unspecified, not intractable, without status epilepticus: Secondary | ICD-10-CM

## 2021-06-11 DIAGNOSIS — E876 Hypokalemia: Principal | ICD-10-CM

## 2021-06-11 DIAGNOSIS — A0472 Enterocolitis due to Clostridium difficile, not specified as recurrent: Secondary | ICD-10-CM

## 2021-06-11 DIAGNOSIS — Z79890 Hormone replacement therapy (postmenopausal): Secondary | ICD-10-CM

## 2021-06-11 DIAGNOSIS — N179 Acute kidney failure, unspecified: Secondary | ICD-10-CM

## 2021-06-11 DIAGNOSIS — E872 Acidosis, unspecified: Secondary | ICD-10-CM

## 2021-06-11 LAB — BASIC METABOLIC PANEL
ANION GAP: 8 mmol/L (ref 4–13)
BUN/CREA RATIO: 4 — ABNORMAL LOW (ref 6–22)
BUN: 3 mg/dL — ABNORMAL LOW (ref 8–25)
CALCIUM: 9 mg/dL (ref 8.5–10.2)
CHLORIDE: 113 mmol/L — ABNORMAL HIGH (ref 96–111)
CO2 TOTAL: 16 mmol/L — ABNORMAL LOW (ref 22–32)
CREATININE: 0.69 mg/dL (ref 0.49–1.10)
ESTIMATED GFR: >60 mL/min/{1.73_m2} (ref >60)
GLUCOSE: 85 mg/dL (ref 65–125)
POTASSIUM: 3.3 mmol/L — ABNORMAL LOW (ref 3.5–5.1)
SODIUM: 137 mmol/L (ref 136–145)

## 2021-06-11 LAB — CBC
HCT: 28.6 % — ABNORMAL LOW (ref 34.8–46.0)
HGB: 9 g/dL — ABNORMAL LOW (ref 11.5–16.0)
MCH: 24.9 pg — ABNORMAL LOW (ref 26.0–32.0)
MCHC: 31.5 g/dL (ref 31.0–35.5)
MCV: 79 fL (ref 78.0–100.0)
MPV: 11.6 fL (ref 8.7–12.5)
PLATELETS: 227 10*3/uL (ref 150–400)
RBC: 3.62 10*6/uL — ABNORMAL LOW (ref 3.85–5.22)
RDW-CV: 19.9 % — ABNORMAL HIGH (ref 11.5–15.5)
WBC: 9 10*3/uL

## 2021-06-11 MED ORDER — FLU VACCINE QS 2022-23(6MOS UP)(PF) 60 MCG(15 MCGX4)/0.5 ML IM SYRINGE
0.5000 mL | INJECTION | Freq: Once | INTRAMUSCULAR | Status: AC
Start: 2021-06-11 — End: 2021-06-11
  Administered 2021-06-11: 10:00:00 0.5 mL via INTRAMUSCULAR
  Filled 2021-06-11: qty 0.5

## 2021-06-11 MED ORDER — VANCOMYCIN 125 MG CAPSULE
125.0000 mg | ORAL_CAPSULE | Freq: Four times a day (QID) | ORAL | 0 refills | Status: AC
Start: 2021-06-11 — End: 2021-06-18

## 2021-06-11 MED ORDER — POTASSIUM CHLORIDE ER 20 MEQ TABLET,EXTENDED RELEASE(PART/CRYST)
40.0000 meq | ORAL_TABLET | Freq: Two times a day (BID) | ORAL | 0 refills | Status: AC
Start: 2021-06-11 — End: 2021-06-18

## 2021-06-11 NOTE — Nurses Notes (Signed)
Patient prepared for discharged home with family.  AVS reviewed with patient.  A written copy of the AVS and discharge instructions was given to the patient.  Questions sufficiently answered as needed.  Patient encouraged to follow up with PCP as indicated.  In the event of an emergency, patient/care giver instructed to call 911 or go to the nearest emergency room.   Pt requested tele and IV fluids be removed while she waits for her ride to arrive.  Fluids stopped.  IV removed.  Tele discontinued.  Pt resting comfortable.  Bed alarm set.    Al Pimple, RN  06/11/2021, 10:22

## 2021-06-11 NOTE — Nurses Notes (Signed)
Patient discharged home with family.  AVS reviewed with patient/care giver.  A written copy of the AVS and discharge instructions was given to the patient/care giver.  Questions sufficiently answered as needed.  Patient/care giver encouraged to follow up with PCP as indicated.  In the event of an emergency, patient/care giver instructed to call 911 or go to the nearest emergency room.   Al Pimple, RN  06/11/2021, 10:51

## 2021-06-11 NOTE — Transitional Care (Signed)
[facility]   Transitional Care Coordination   Initial Assessment       Name: Teresa Ramirez   Date of Birth: 12/17/1960 60 y.o.  Date of service: 06/11/2021  Lay Caregiver: [place] Caregiver Name: Elvis Coil, Hoyle Barr Caregiver Contact Number: [phone], Lay Caregiver Relationship to patient: parent          Attempted to reach pt to complete assessment.  No answer at bedside phone noted.   [place], [place]  69/01/7892, 81:01

## 2021-06-11 NOTE — Care Management Notes (Signed)
[facility]  Care Management Note    Patient Name: Teresa Ramirez  Date of Birth: 1960-11-16  Sex: female  Date/Time of Admission: 06/06/2021 12:08 PM  Room/Bed: 17/A  Payor: HUMANA MEDICARE / Plan: HUMANA CHOICE PPO / Product Type: PPO /    LOS: 5 days   Primary Care Providers:  Theora Gianotti II, DO, DO (General)    Admitting Diagnosis:  Hypercalcemia [E83.52]    Assessment:      06/11/21 0949   Assessment Details   Assessment Type Continued Assessment   Date of Care Management Update 06/11/21   Date of Next DCP Update 06/12/21   Medicare Intent to Discharge Documentation   Discharge IMM give to: Patient   Discharge IMM Letter Given Date 06/11/21   Discharge IMM Letter Given Time 0943   IMM explained/reviewed with:  Patient   Care Management Plan   Discharge Planning Status discharge plan complete   Projected Discharge Date 06/11/21   Discharge plan discussed with: Patient   Discharge Needs Assessment   Discharge Facility/Level of Care Needs Home (Patient/Family Member/other)(code 1)   Transportation Available car;family or friend will provide     Per Service pt stable for DC today. MSW met with pt at bedside. Reviewed IMM. Pt given copy and she signed copy for her chart. Plan to DC home and has no DC needs at this time. Mom to transport home.     Discharge Plan:  Home (Patient/Family Member/other) (code 1)      The patient will continue to be evaluated for developing discharge needs.     Case Manager: Norton Pastel, [place]  Phone: [phone]

## 2021-06-11 NOTE — Transitional Care (Signed)
Hospital discharge follow up appointment is advised for 1 week with Theora Gianotti II, DO at the walk in clinic.  Information added to the AVS.  Illene Regulus, LPN  60/01/3015, 01:09

## 2021-06-11 NOTE — Discharge Instructions (Signed)
If there are any other questions you have, or if a medical problem should develop, please call [phone] ([phone]). Our transitions nurses and social worker are available to assist you Monday thru Friday from 7am-3pm. These nurses can help you with questions regarding your discharge instructions. If this is after 3pm, a weekend, or holiday please call and ask to speak to the doctor on call for medicine.  In case of an emergency call 911.

## 2021-06-11 NOTE — Care Plan (Signed)
Patient has rested better tonight.  VSS.  IV  NS + 20K infusing at 150/hr.  PO K+ ordered TID.  Last K+ level was yesterday at 3.2.  OOB to BSC with SBA.  Patient has been regaining her strength daily.       Problem: Adult Inpatient Plan of Care  Goal: Plan of Care Review  Outcome: Ongoing (see interventions/notes)  Goal: Patient-Specific Goal (Individualized)  Outcome: Ongoing (see interventions/notes)  Flowsheets (Taken 06/10/2021 2000)  Individualized Care Needs: OOB X1 assist  Anxieties, Fears or Concerns: none voiced  Goal: Absence of Hospital-Acquired Illness or Injury  Outcome: Ongoing (see interventions/notes)  Intervention: Identify and Manage Fall Risk  Recent Flowsheet Documentation  Taken 06/10/2021 2200 by Melford Aase, RN  Safety Promotion/Fall Prevention: safety round/check completed  Taken 06/10/2021 2000 by Melford Aase, RN  Safety Promotion/Fall Prevention: safety round/check completed  Intervention: Prevent Skin Injury  Recent Flowsheet Documentation  Taken 06/10/2021 2000 by Melford Aase, RN  Body Position: semi-fowlers (less than 30 degrees)  Skin Protection: adhesive use limited  Intervention: Prevent and Manage VTE (Venous Thromboembolism) Risk  Recent Flowsheet Documentation  Taken 06/10/2021 2000 by Melford Aase, RN  VTE Prevention/Management: ambulation promoted  Intervention: Prevent Infection  Recent Flowsheet Documentation  Taken 06/10/2021 2000 by Melford Aase, RN  Infection Prevention:   single patient room provided   personal protective equipment utilized  Goal: Optimal Comfort and Wellbeing  Outcome: Ongoing (see interventions/notes)  Intervention: Provide Person-Centered Care  Recent Flowsheet Documentation  Taken 06/10/2021 2000 by Melford Aase, RN  Trust Relationship/Rapport:   care explained   questions encouraged   questions answered  Goal: Rounds/Family Conference  Outcome: Ongoing (see interventions/notes)

## 2021-06-11 NOTE — Discharge Summary (Signed)
[facility]   DISCHARGE SUMMARY    PATIENT NAME:  Teresa Ramirez, Teresa Ramirez  MRN:  [id number]  DOB:  October 06, 1960    ENCOUNTER DATE:  06/06/2021  INPATIENT ADMISSION DATE: 06/06/2021  DISCHARGE DATE:  06/11/2021    ATTENDING PHYSICIAN: Celene Skeen, MD  SERVICE: FMT HOSPITALIST 1  PRIMARY CARE PHYSICIAN: Theora Gianotti II, DO         LAY CAREGIVER: [place] Caregiver Name: Vivi Barrack Caregiver Contact Number: [phone], Lay Caregiver Relationship to patient: parent      PRIMARY DISCHARGE DIAGNOSIS: Hypokalemia  Active Hospital Problems    Diagnosis Date Noted   . Principle Problem: Hypokalemia [E87.6] 06/07/2021   . C. difficile diarrhea [A04.72] 06/08/2021   . Hypercalcemia [E83.52] 06/06/2021   . Nausea vomiting and diarrhea [R11.2, R19.7] 06/06/2021   . Metabolic acidosis, increased anion gap (IAG) [E87.29] 06/06/2021   . Metabolic acidosis, normal anion gap (NAG) [E87.20] 06/06/2021   . Hematuria [R31.9] 06/06/2021   . Hypothyroidism [E03.9] 06/06/2021   . Epilepsy (CMS [facility]) [Y10.175] 09/24/1997      Resolved Hospital Problems    Diagnosis    . AKI (acute kidney injury) (CMS [facility]) [N17.9]    . Hyperammonemia (CMS HCC) [E72.20]    . Prolonged Q-T interval on ECG [R94.31]      Active Non-Hospital Problems    Diagnosis Date Noted   . Left arm weakness 11/20/2011   . 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 Asthma 09/24/1997   . Migraine Headaches 09/24/1997   . Hypertension 09/13/1995        DISCHARGE MEDICATIONS:     Current Discharge Medication List      START taking these medications.      Details   vancomycin 125 mg Capsule  Commonly known as: VANCOCIN   125 mg, Oral, 4 TIMES DAILY  Qty: 27 Capsule  Refills: 0        CONTINUE these medications which have CHANGED during your visit.      Details   potassium chloride 20 mEq Tab Sust.Rel. Particle/Crystal  Commonly known as: K-DUR  What changed:    medication strength   how much to take   when to take this   40 mEq, Oral, 2 TIMES DAILY  Qty: 28  Tablet  Refills: 0        CONTINUE these medications - NO CHANGES were made during your visit.      Details   levETIRAcetam 500 mg Tablet  Commonly known as: KEPPRA   500 mg, Oral, 2 TIMES DAILY  Refills: 0     levothyroxine 75 mcg Tablet  Commonly known as: SYNTHROID   No dose, route, or frequency recorded.  Refills: 0     Nortrel 7/7/7 (28) 0.5/0.75/1 mg- 35 mcg Tablet  Generic drug: Norethin-Eth Estrad Triphasic   1 po daily  Qty: 84 Tablet  Refills: 3     ondansetron 4 mg Tablet, Rapid Dissolve  Commonly known as: ZOFRAN ODT   4 mg, Oral, EVERY 8 HOURS PRN  Qty: 12 Tablet  Refills: 0     venlafaxine 75 mg Capsule, Sust. Release 24 hr  Commonly known as: EFFEXOR XR   TAKE 1 CAPSULE BY MOUTH EVERY DAY  Refills: 0          Discharge med list refreshed?  YES                     ALLERGIES:  Allergies  Allergen Reactions   . Compazine [Prochlorperazine Edisylate] Seizure   . Haldol [Haloperidol]    . Periactin [Cyproheptadine] Anaphylaxis   . Phenergan [Promethazine] Seizure   . Reglan [Metoclopramide] Seizure   . Theophylline              HOSPITAL PROCEDURE(S):   Bedside Procedures:  No orders of the defined types were placed in this encounter.    Surgical     REASON FOR HOSPITALIZATION AND HOSPITAL COURSE     BRIEF HPI: YSELA HETTINGER is a female aged 60 y.o. with h/o hypothyroidism, epilepsy, & recent acute liver illness of unclear etiology who was admitted 06/06/2021 for N/V/D with severe metabolic derangements including hypercalcemia, hypokalemia, hyperammonemia, metabolic acidosis, & AKI.  Presentation also notable for prolonged QT.  Hospital course notable for improving hypercalcemia, AKI, & QT interval; hypokalemia also improving, but more slowly.  She was found to have Cdiff as etiology of diarrhea.  Currently she is improving.      Plan:  Hypokalemia, improving  N/V/D, improving  Metabolic Acidosis, improving  Prolonged QTc, improved  AKI, improved  - Potassium 3.3  - continue potassium 12mEq PO BID at  home x 7 days  - continue IVF with potassium  - continue ondansetron PRN for nausea    Clostridial Diarrhea  Cdiff toxin positive 10/30.  - continue vancomycin PO (last dose planned 11/09)    Hypercalcemia, resolved  - resolved    Hyperammonemia, improved  - resolved    Chronic Medical Conditions  Epilepsy: continue home levetiracetam  Hypothyroidism: continue home levothyroxine    CONDITION ON DISCHARGE:  A. Ambulation: Full ambulation  B. Self-care Ability: Complete  C. Cognitive Status Alert and Oriented x 3  D. Code status at discharge: Full code           LINES/DRAINS/WOUNDS AT DISCHARGE:   Patient Lines/Drains/Airways Status     Active Line / Dialysis Catheter / Dialysis Graft / Drain / Airway / Wound     Name Placement date Placement time Site Days    Peripheral IV Right Basilic  (medial side of arm) 06/11/21  0430  -- less than 1                DISCHARGE DISPOSITION:  Home discharge              DISCHARGE INSTRUCTIONS:    No discharge procedures on file.             Celene Skeen, MD    Copies sent to Care Team       Relationship Specialty Notifications Start End    Theora Gianotti II, DO PCP - General EXTERNAL  04/19/19     Phone: [phone] Fax: [phone]         [address] [place] [address]            Referring providers can utilize [email or web address] to access their referred [facility] patient's information.

## 2021-06-11 NOTE — Progress Notes (Signed)
[facility]  Medicine Progress Note    [name]  Date of service: 06/11/2021  Date of admission: 06/06/2021   LOS: 5 days   Code status: Full Code    Subjective:    States diarrhea is slowing down and she wants to go home. No F/C/CP/SOB/N/V at this time.    Discussed plan of care with patient and nursing staff  - she wants to go home; diarrhea improving  -Potassium 3.3 this morning. Can continue with  K-dur 40 meQ BID at home  - continue vancomycin through 06/17/2021 at home  - continue monitoring for improvement in her diarrhea.      I spent > 30 minutes  planning and coordinating this patient's discharge    Objective:  I/O:  I/O last 24 hours:      Intake/Output Summary (Last 24 hours) at 06/11/2021 0724  Last data filed at 06/10/2021 1800  Gross per 24 hour   Intake 720 ml   Output --   Net 720 ml     I/O current shift:  No intake/output data recorded.    Current Medications:  acetaminophen (TYLENOL) tablet, 650 mg, Oral, Q6H PRN  D5W 250 mL flush bag, , Intravenous, Q15 Min PRN  enoxaparin PF (LOVENOX) 40 mg/0.4 mL SubQ injection, 40 mg, Subcutaneous, Q24H  levETIRAcetam (KEPPRA) tablet, 500 mg, Oral, 2x/day  levothyroxine (SYNTHROID) tablet, 75 mcg, Oral, QAM  multivitamin (THERA) tablet, 1 Tablet, Oral, Daily  NS 1000 mL with potassium chloride 20 mEq premix infusion, , Intravenous, Continuous  NS 250 mL flush bag, , Intravenous, Q15 Min PRN  NS flush syringe, 2-6 mL, Intracatheter, Q8HRS  NS flush syringe, 2-6 mL, Intracatheter, Q1 MIN PRN  ondansetron (ZOFRAN) 2 mg/mL injection, 4 mg, Intravenous, Q8H PRN  potassium chloride (K-DUR) extended release tablet, 40 mEq, Oral, 3x/day-Meals  vancomycin (VANCOCIN) capsule, 125 mg, Oral, 4x/day  venlafaxine (EFFEXOR XR) 24 hr extended release capsule, 75 mg, Oral, Daily    Vital Signs:  Filed Vitals:    06/11/21 0030 06/11/21 0059 06/11/21 0100 06/11/21 0400   BP: (!) 152/78  (!) 157/82 (!) 144/74   Pulse: 79  84 74   Resp: 19  18 (!) 22   Temp:  36.1  C (97 F)  36.8 C (98.3 F)   SpO2:         Physical Exam:   General:Cooperative female in no acute distress. VS reviewed; Pleasant and conversant today  HEENT: normocephalic, atraumatic  Lungs: Clear to auscultation bilaterally. No crackles, rales or wheezing   Cardiovascular: regular rate and rhythm, S1, S2 normal, no murmur, click, rub or gallop   Abdomen: Soft, non-tender, Bowel sounds normal, non-distended, No hepatosplenomegaly   Extremities: No cyanosis or edema  Neurologic: Alert and oriented x3  Skin: no rashes or lesions  Psychiatric: Normal Affect      Labs:  CBC Results Differential Results   Recent Results (from the past 30 hour(s))   CBC WITH DIFF    Collection Time: 06/10/21  5:15 AM   Result Value    WBC 8.8    HGB 8.3 (L)    HCT 25.8 (L)    PLATELETS 238    Recent Results (from the past 30 hour(s))   CBC WITH DIFF    Collection Time: 06/10/21  5:15 AM   Result Value    WBC 8.8    NEUTROPHIL % 60    MONOCYTE % 10    BASOPHIL % 0  BASOPHIL # <0.04      BMP Results Other Chemistries Results   Results for orders placed or performed during the hospital encounter of 06/06/21 (from the past 30 hour(s))   BASIC METABOLIC PANEL - AM ONCE    Collection Time: 06/11/21  3:40 AM   Result Value    SODIUM 137    POTASSIUM 3.3 (L)    CHLORIDE 113 (H)    CO2 TOTAL 16 (L)    GLUCOSE 85    BUN 3 (L)    CREATININE 0.69   POTASSIUM    Collection Time: 06/10/21  1:48 PM   Result Value    POTASSIUM 3.2 (L)    Recent Results (from the past 30 hour(s))   MAGNESIUM    Collection Time: 06/10/21  5:15 AM   Result Value    MAGNESIUM 1.8      Liver/Pancreas Enzyme Results Liver Function Results   No results found for this or any previous visit (from the past 30 hour(s)). No results found for this or any previous visit (from the past 30 hour(s)).   Cardiac Results Coags Results   No results found for this or any previous visit (from the past 30 hour(s)). No results found for this or any previous visit (from the past 30  hour(s)).     Microbiology:    No results found for any visits on 06/06/21 (from the past 24 hour(s)).     Radiology:  No new results today      Assessment:  Active Hospital Problems    Diagnosis   . Primary Problem: Hypokalemia   . C. difficile diarrhea   . Hypercalcemia   . Nausea vomiting and diarrhea   . Metabolic acidosis, increased anion gap (IAG)   . Metabolic acidosis, normal anion gap (NAG)   . Hematuria   . Hypothyroidism   . Epilepsy (CMS [place])     Teresa Ramirez is a female aged 60 y.o. with h/o hypothyroidism, epilepsy, & recent acute liver illness of unclear etiology who was admitted 06/06/2021 for N/V/D with severe metabolic derangements including hypercalcemia, hypokalemia, hyperammonemia, metabolic acidosis, & AKI.  Presentation also notable for prolonged QT.  Hospital course notable for improving hypercalcemia, AKI, & QT interval; hypokalemia also improving, but more slowly.  She was found to have Cdiff as etiology of diarrhea.  Currently she is improving.      Plan:  Hypokalemia, improving  N/V/D, improving  Metabolic Acidosis, improving  Prolonged QTc, improved  AKI, improved  - Potassium 3.3  - continue potassium 54mEq PO BID at home x 7 days  - continue IVF with potassium  - continue ondansetron PRN for nausea    Clostridial Diarrhea  Cdiff toxin positive 10/30.  - continue vancomycin PO (last dose planned 11/09)    Hypercalcemia, resolved  - resolved    Hyperammonemia, improved  - resolved    Chronic Medical Conditions  Epilepsy: continue home levetiracetam  Hypothyroidism: continue home levothyroxine      DVT/PE Prophylaxis: Enoxaparin  Disposition Planning: Home.    Howell Rucks, MD 06/11/2021 07:24  Hospitalist  Department of Internal Medicine  [facility]

## 2021-06-17 LAB — PARATHYROID HORMONE-RELATED PEPTIDE (PTHRP), PLASMA: PTH-RP: 13 pg/mL (ref 11–20)

## 2021-07-24 ENCOUNTER — Emergency Department (HOSPITAL_COMMUNITY)
Admission: EM | Admit: 2021-07-24 | Discharge: 2021-07-25 | Disposition: A | Discharge status: Home / Self Care | Payer: Medicare PPO | Attending: Emergency Medicine | Admitting: Emergency Medicine

## 2021-07-24 ENCOUNTER — Emergency Department (HOSPITAL_COMMUNITY): Payer: Medicare PPO

## 2021-07-24 ENCOUNTER — Encounter (HOSPITAL_COMMUNITY): Payer: Self-pay | Admitting: Emergency Medicine

## 2021-07-24 DIAGNOSIS — E039 Hypothyroidism, unspecified: Secondary | ICD-10-CM | POA: Diagnosis not present

## 2021-07-24 DIAGNOSIS — E876 Hypokalemia: Secondary | ICD-10-CM | POA: Diagnosis not present

## 2021-07-24 DIAGNOSIS — I10 Essential (primary) hypertension: Secondary | ICD-10-CM | POA: Diagnosis not present

## 2021-07-24 DIAGNOSIS — R1011 Right upper quadrant pain: Secondary | ICD-10-CM | POA: Diagnosis not present

## 2021-07-24 DIAGNOSIS — Z7982 Long term (current) use of aspirin: Secondary | ICD-10-CM | POA: Diagnosis not present

## 2021-07-24 DIAGNOSIS — R519 Headache, unspecified: Secondary | ICD-10-CM | POA: Insufficient documentation

## 2021-07-24 DIAGNOSIS — R1110 Vomiting, unspecified: Secondary | ICD-10-CM | POA: Diagnosis present

## 2021-07-24 DIAGNOSIS — R197 Diarrhea, unspecified: Secondary | ICD-10-CM | POA: Insufficient documentation

## 2021-07-24 DIAGNOSIS — Z79899 Other long term (current) drug therapy: Secondary | ICD-10-CM | POA: Diagnosis not present

## 2021-07-24 LAB — CBC WITH DIFFERENTIAL/PLATELET
Abs Immature Granulocytes: 0.02 10*3/uL (ref 0.00–0.07)
Basophils Absolute: 0 10*3/uL (ref 0.0–0.1)
Basophils Relative: 0 %
Eosinophils Absolute: 0 10*3/uL (ref 0.0–0.5)
Eosinophils Relative: 0 %
HCT: 32.9 % — ABNORMAL LOW (ref 36.0–46.0)
Hemoglobin: 10.3 g/dL — ABNORMAL LOW (ref 12.0–15.0)
Immature Granulocytes: 0 %
Lymphocytes Relative: 11 %
Lymphs Abs: 0.7 10*3/uL (ref 0.7–4.0)
MCH: 25.6 pg — ABNORMAL LOW (ref 26.0–34.0)
MCHC: 31.3 g/dL (ref 30.0–36.0)
MCV: 81.8 fL (ref 80.0–100.0)
Monocytes Absolute: 0.4 10*3/uL (ref 0.1–1.0)
Monocytes Relative: 6 %
Neutro Abs: 5.4 10*3/uL (ref 1.7–7.7)
Neutrophils Relative %: 83 %
Platelets: 165 10*3/uL (ref 150–400)
RBC: 4.02 MIL/uL (ref 3.87–5.11)
RDW: 19.2 % — ABNORMAL HIGH (ref 11.5–15.5)
WBC: 6.6 10*3/uL (ref 4.0–10.5)
nRBC: 0 % (ref 0.0–0.2)

## 2021-07-24 LAB — COMPREHENSIVE METABOLIC PANEL
ALT: 30 U/L (ref 0–44)
AST: 80 U/L — ABNORMAL HIGH (ref 15–41)
Albumin: 5 g/dL (ref 3.5–5.0)
Alkaline Phosphatase: 128 U/L — ABNORMAL HIGH (ref 38–126)
Anion gap: 24 — ABNORMAL HIGH (ref 5–15)
BUN: 10 mg/dL (ref 6–20)
CO2: 17 mmol/L — ABNORMAL LOW (ref 22–32)
Calcium: 9.7 mg/dL (ref 8.9–10.3)
Chloride: 96 mmol/L — ABNORMAL LOW (ref 98–111)
Creatinine, Ser: 0.95 mg/dL (ref 0.44–1.00)
GFR, Estimated: >60 mL/min (ref >60)
Glucose, Bld: 183 mg/dL — ABNORMAL HIGH (ref 70–99)
Potassium: 2.6 mmol/L — CL (ref 3.5–5.1)
Sodium: 137 mmol/L (ref 135–145)
Total Bilirubin: 2.5 mg/dL — ABNORMAL HIGH (ref 0.3–1.2)
Total Protein: 8.8 g/dL — ABNORMAL HIGH (ref 6.5–8.1)

## 2021-07-24 LAB — ETHANOL: Alcohol, Ethyl (B): <10 mg/dL (ref <10)

## 2021-07-24 LAB — LIPASE, BLOOD: Lipase: 38 U/L (ref 11–51)

## 2021-07-24 NOTE — ED Triage Notes (Signed)
PT reports N/V with decreased PO intake x5 days.

## 2021-07-24 NOTE — ED Provider Notes (Signed)
Emergency Medicine Provider Triage Evaluation Note  Judy Meyer , a 60 y.o. female  was evaluated in triage.  Pt complains of nv x3 weeks. Also c/o headache.  Review of Systems  Positive: Nv, headache Negative: fevers  Physical Exam  BP (!) 170/82 (BP Location: Left Arm)    Pulse (!) 102    Temp 98.3 F (36.8 C) (Oral)    Resp 18    LMP 06/11/2018    SpO2 98%  Gen:   Awake, no distress   Resp:  Normal effort  MSK:   Moves extremities without difficulty  Other:  Ruq ttp  Medical Decision Making  Medically screening exam initiated at 3:37 PM.  Appropriate orders placed.  Judy Meyer was informed that the remainder of the evaluation will be completed by another provider, this initial triage assessment does not replace that evaluation, and the importance of remaining in the ED until their evaluation is complete.     Bishop Dublin 07/24/21 1537    Hayden Rasmussen, MD 07/24/21 [phone]

## 2021-07-24 NOTE — ED Notes (Signed)
2.6 Potassium reported to [employer]

## 2021-07-25 ENCOUNTER — Encounter (HOSPITAL_COMMUNITY): Payer: Self-pay

## 2021-07-25 ENCOUNTER — Emergency Department (HOSPITAL_COMMUNITY): Payer: Medicare PPO

## 2021-07-25 LAB — COMPREHENSIVE METABOLIC PANEL
ALT: 27 U/L (ref 0–44)
AST: 61 U/L — ABNORMAL HIGH (ref 15–41)
Albumin: 5.1 g/dL — ABNORMAL HIGH (ref 3.5–5.0)
Alkaline Phosphatase: 121 U/L (ref 38–126)
Anion gap: 19 — ABNORMAL HIGH (ref 5–15)
BUN: 12 mg/dL (ref 6–20)
CO2: 21 mmol/L — ABNORMAL LOW (ref 22–32)
Calcium: 9.5 mg/dL (ref 8.9–10.3)
Chloride: 95 mmol/L — ABNORMAL LOW (ref 98–111)
Creatinine, Ser: 1.03 mg/dL — ABNORMAL HIGH (ref 0.44–1.00)
GFR, Estimated: >60 mL/min (ref >60)
Glucose, Bld: 118 mg/dL — ABNORMAL HIGH (ref 70–99)
Potassium: 2.4 mmol/L — CL (ref 3.5–5.1)
Sodium: 135 mmol/L (ref 135–145)
Total Bilirubin: 2.7 mg/dL — ABNORMAL HIGH (ref 0.3–1.2)
Total Protein: 9 g/dL — ABNORMAL HIGH (ref 6.5–8.1)

## 2021-07-25 LAB — URINALYSIS, ROUTINE W REFLEX MICROSCOPIC
Bacteria, UA: NONE SEEN
Bilirubin Urine: NEGATIVE
Glucose, UA: NEGATIVE mg/dL
Ketones, ur: 20 mg/dL — AB
Nitrite: NEGATIVE
Protein, ur: 100 mg/dL — AB
Specific Gravity, Urine: 1.036 — ABNORMAL HIGH (ref 1.005–1.030)
WBC, UA: >50 WBC/hpf — ABNORMAL HIGH (ref 0–5)
pH: 6 (ref 5.0–8.0)

## 2021-07-25 LAB — BLOOD GAS, VENOUS
Acid-base deficit: 0.4 mmol/L (ref 0.0–2.0)
Bicarbonate: 22.6 mmol/L (ref 20.0–28.0)
O2 Saturation: 69.6 %
Patient temperature: 98.3
pCO2, Ven: 33.1 mmHg — ABNORMAL LOW (ref 44.0–60.0)
pH, Ven: 7.449 — ABNORMAL HIGH (ref 7.250–7.430)
pO2, Ven: 38.7 mmHg (ref 32.0–45.0)

## 2021-07-25 LAB — MAGNESIUM: Magnesium: 1.7 mg/dL (ref 1.7–2.4)

## 2021-07-25 LAB — TROPONIN I (HIGH SENSITIVITY): Troponin I (High Sensitivity): 7 ng/L (ref <18)

## 2021-07-25 IMAGING — CT CT ABD-PELV W/ CM
2 of 5 series · 15 of 46 positions shown, 17 images · IV contrast (omnipaque)
Comparison: [DATE]

CLINICAL DATA: Nausea, vomiting, anorexia

EXAM:
CT ABDOMEN AND PELVIS WITH CONTRAST
TECHNIQUE: Multidetector CT imaging of the abdomen and pelvis was performed
using the standard protocol following bolus administration of
intravenous contrast.
CONTRAST:  80mL OMNIPAQUE IOHEXOL 350 MG/ML SOLN

[Series 2: axial st · axial · 0.76mm/px · z∈[-614,-214]mm · 12 of 94 slices shown, 14 images]
[im 7/94  soft-tissue]
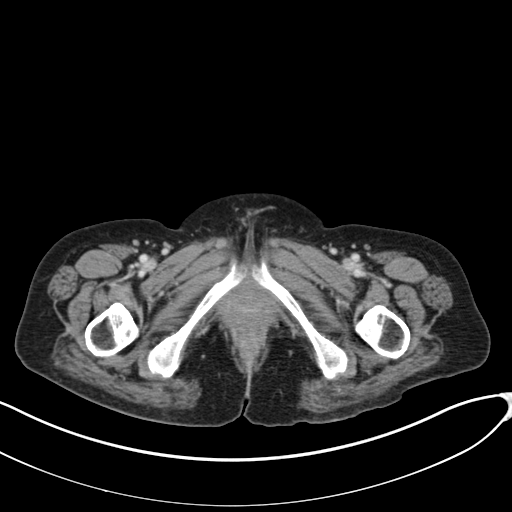
[im 7/94  bone]
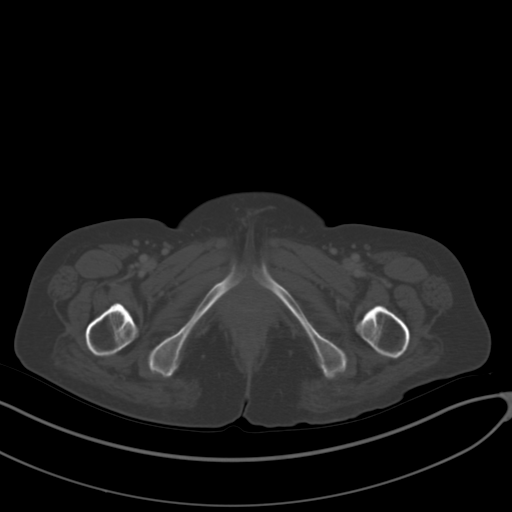
[im 13/94  soft-tissue]
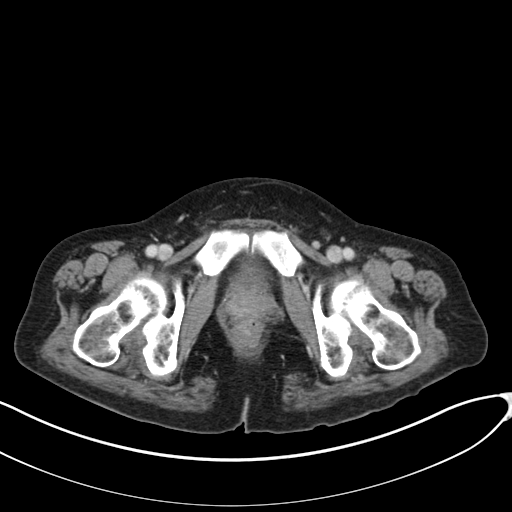
[im 19/94  soft-tissue]
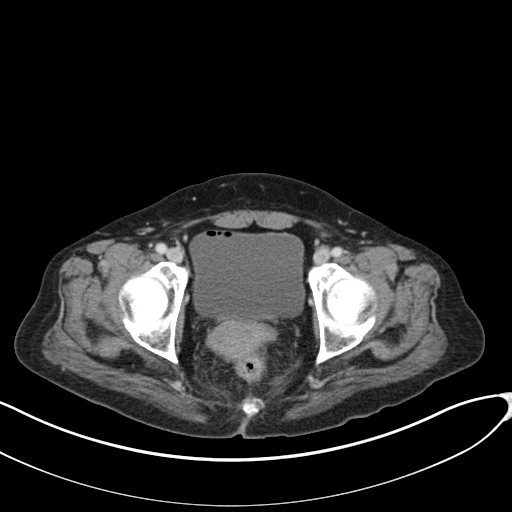
[im 32/94  soft-tissue]
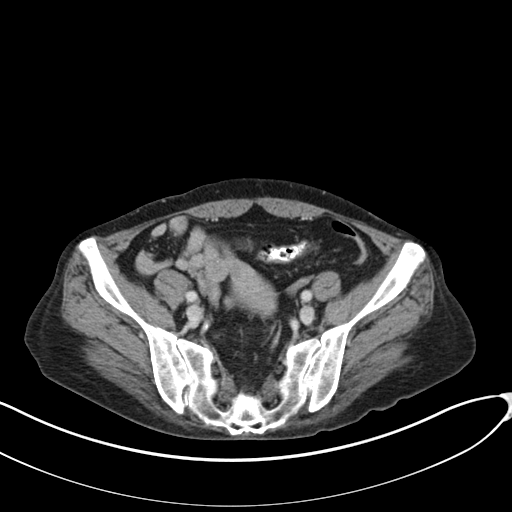
[im 38/94  soft-tissue]
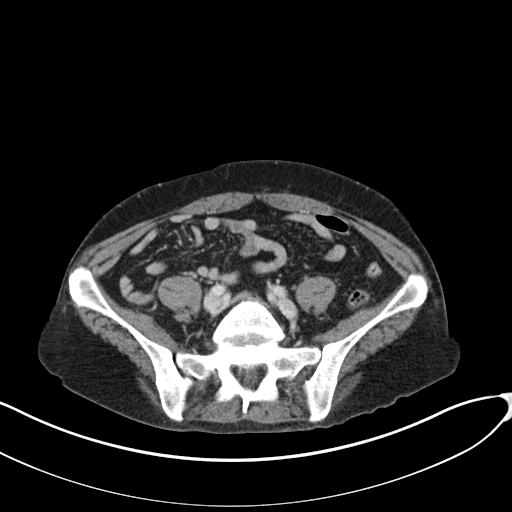
[im 44/94  soft-tissue]
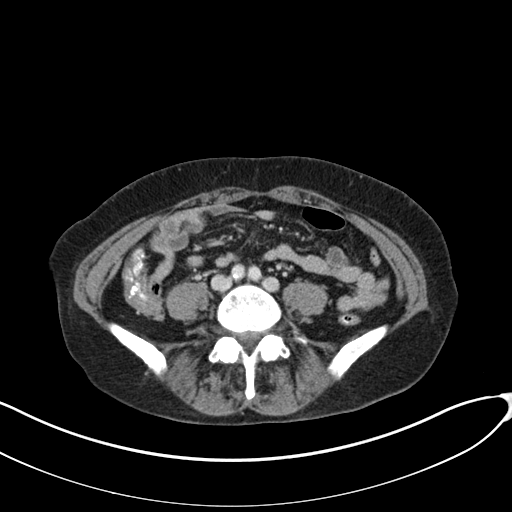
[im 50/94  soft-tissue]
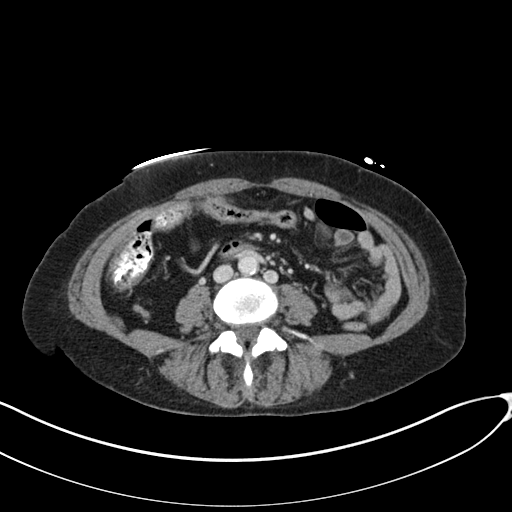
[im 56/94  soft-tissue]
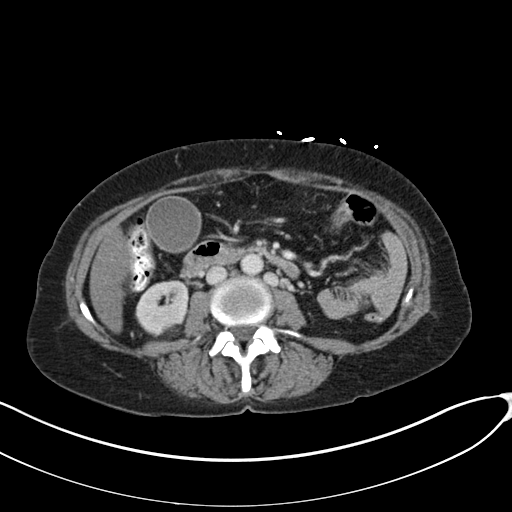
[im 63/94  soft-tissue]
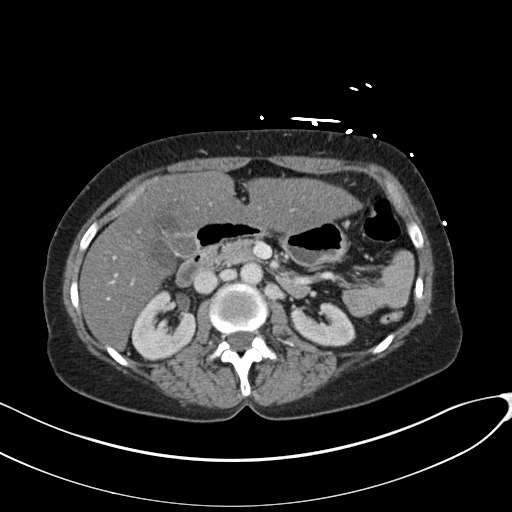
[im 63/94  bone]
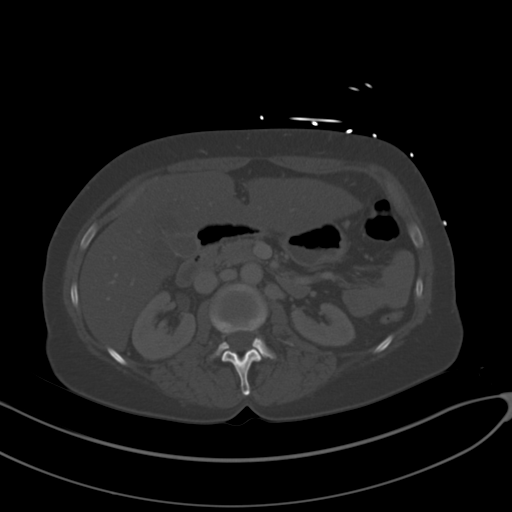
[im 75/94  soft-tissue]
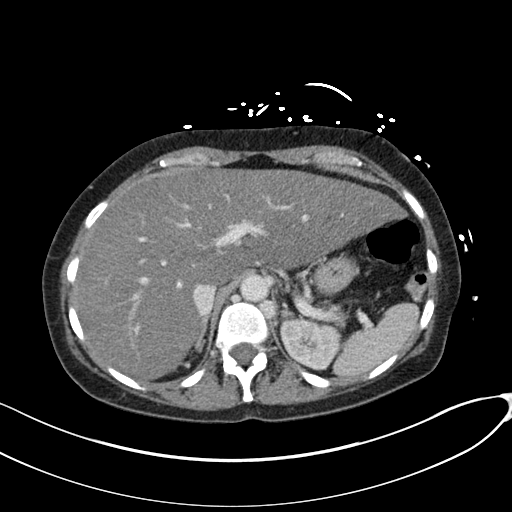
[im 81/94  soft-tissue]
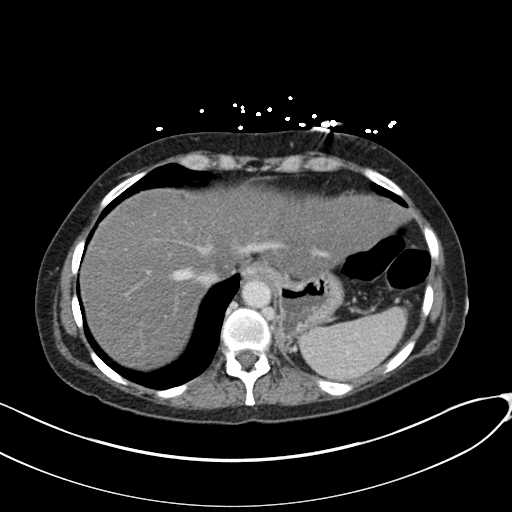
[im 87/94  soft-tissue]
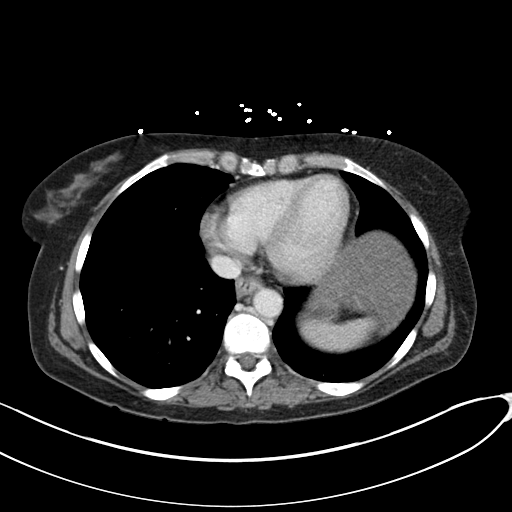

[Series 5: coronal st · coronal · 0.71mm/px · 3 of 130 slices shown]
[im 44/130  soft-tissue]
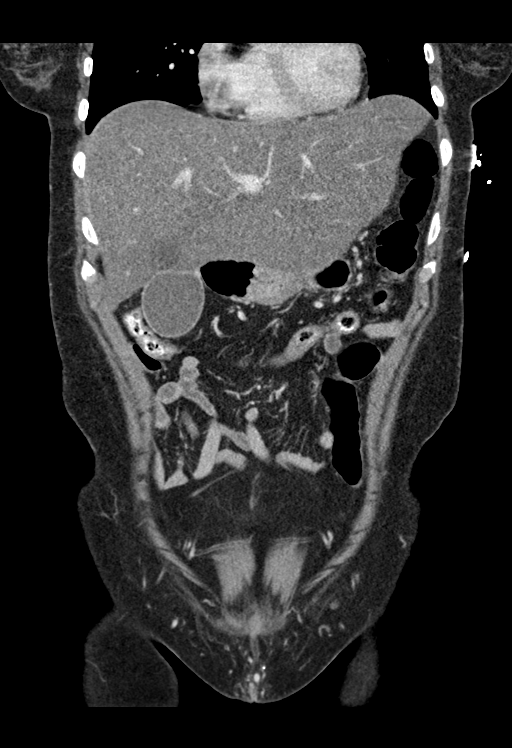
[im 58/130  soft-tissue]
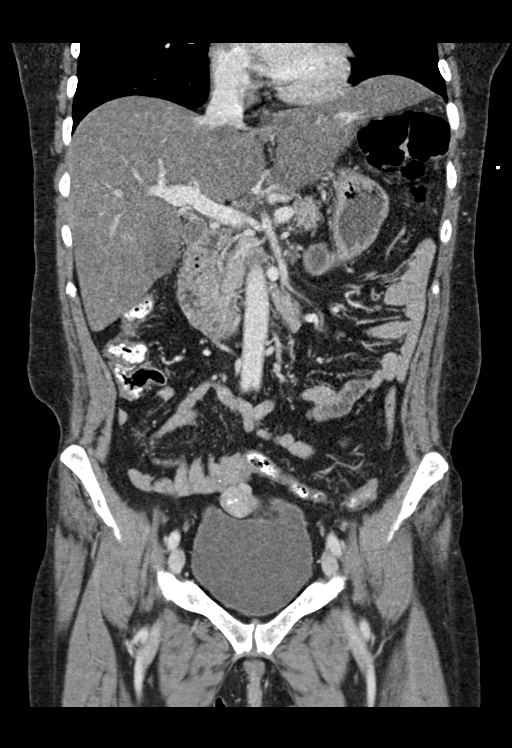
[im 72/130  soft-tissue]
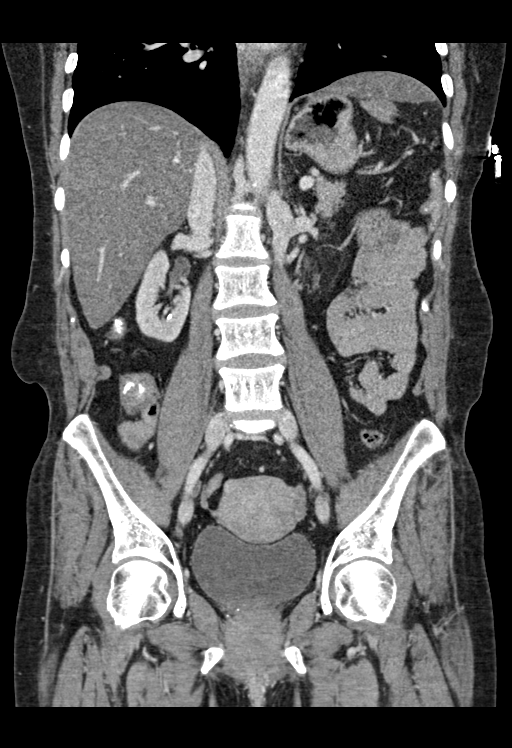

[15 of 46 positions shown; findings below may reference images not displayed]

FINDINGS: Lower chest: No acute abnormality.

Hepatobiliary: Marked hepatic steatosis. Focal fatty deposition
within the gallbladder fossa. No focal enhancing intrahepatic mass
identified. No intra or extrahepatic biliary ductal dilation.
Gallbladder unremarkable.

Pancreas: Unremarkable

Spleen: Unremarkable

Adrenals/Urinary Tract: The adrenal glands are unremarkable. The
kidneys are normal in size and position. 3.0 cm right adrenal
myelolipoma is again seen, largely lipomatous in nature. 4 mm
nonobstructing calculus noted within the lower pole of the right
kidney. No hydronephrosis. No ureteral calculi. Nondependent foci of
gas within the bladder lumen may relate to recent catheterization.

Stomach/Bowel: Stomach is within normal limits. Appendix appears
normal. No evidence of bowel wall thickening, distention, or
inflammatory changes.

Vascular/Lymphatic: Mild atherosclerotic calcification within the
abdominal aorta. No aortic aneurysm. Variant anatomy with indirect
retroaortic left renal vein via the azygous and hemi azygous veins.
No pathologic adenopathy within the abdomen and pelvis.

Reproductive: Lobulated appearance of the uterus secondary to
multiple enhancing, partially calcified masses in keeping with
multiple uterine fibroids. No adnexal masses are seen.

Other: No abdominal wall hernia.

Musculoskeletal: No acute bone abnormality
IMPRESSION: No acute intra-abdominal pathology identified. No definite
radiographic explanation for the patient's reported symptoms.

Marked hepatic steatosis.

3.0 cm right renal angiomyolipoma.

Mild right nonobstructing nephrolithiasis.

Aortic Atherosclerosis ([MV]-[MV]).

## 2021-07-25 MED ORDER — LACTATED RINGERS IV BOLUS
1000.0000 mL | Freq: Once | INTRAVENOUS | Status: AC
  Administered 2021-07-25: 1000 mL via INTRAVENOUS

## 2021-07-25 MED ORDER — LORAZEPAM 2 MG/ML IJ SOLN
1.0000 mg | Freq: Once | INTRAMUSCULAR | Status: AC
  Administered 2021-07-25: 1 mg via INTRAVENOUS
  Filled 2021-07-25: qty 1

## 2021-07-25 MED ORDER — ONDANSETRON HCL 4 MG/2ML IJ SOLN
4.0000 mg | Freq: Once | INTRAMUSCULAR | Status: AC
  Administered 2021-07-25: 4 mg via INTRAVENOUS
  Filled 2021-07-25: qty 2

## 2021-07-25 MED ORDER — POTASSIUM CHLORIDE 10 MEQ/100ML IV SOLN
10.0000 meq | INTRAVENOUS | Status: AC
  Administered 2021-07-25 (×3): 10 meq via INTRAVENOUS
  Filled 2021-07-25 (×3): qty 100

## 2021-07-25 MED ORDER — ONDANSETRON HCL 4 MG PO TABS: 4.0000 mg | ORAL_TABLET | Freq: Four times a day (QID) | ORAL | 0 refills | Status: AC

## 2021-07-25 MED ORDER — IOHEXOL 350 MG/ML SOLN
80.0000 mL | Freq: Once | INTRAVENOUS | Status: AC | PRN
  Administered 2021-07-25: 80 mL via INTRAVENOUS

## 2021-07-25 MED ORDER — LEVETIRACETAM 500 MG PO TABS
500.0000 mg | ORAL_TABLET | Freq: Once | ORAL | Status: AC
  Administered 2021-07-25: 500 mg via ORAL
  Filled 2021-07-25: qty 1

## 2021-07-25 MED ORDER — POTASSIUM CHLORIDE 20 MEQ PO PACK
60.0000 meq | PACK | Freq: Once | ORAL | Status: AC
  Administered 2021-07-25: 60 meq via ORAL
  Filled 2021-07-25: qty 3

## 2021-07-25 NOTE — ED Notes (Signed)
Patient given gingerale for PO challenge.

## 2021-07-25 NOTE — ED Provider Notes (Signed)
[facility] DEPT Provider Note   CSN: [id number] Arrival date & time: 07/24/21  1512     History Chief Complaint  Patient presents with   Emesis    Judy Meyer is a 60 y.o. female.  The history is provided by the patient and medical records.  Emesis Judy Meyer is a 60 y.o. female who presents to the Emergency Department complaining of vomiting.  She presents to the ED for evaluation of vomiting for the last three weeks, significantly worse over the last few days. Up to thirty episodes of emesis daily.  She is now unable to sleep secondary to the vomiting.  Now has right side pain, HA.  Right side pain started about one week ago.  It is described as pain, sharp.  Pain is constant.    No fever.  Had a small amount of diarrhea.  Had cdiff about one month.  No dysuria.  No prior abdominal surgeries.  Had similar issues in the past secondary to liver issues.    Has a hx/o seizure d/o, takes keppra.  Cannot keep meds down.    No tobacco.  Drinks occasional alcohol, last drink a few days.  Takes small sips of vodka, difficult to quantify.  Stopped drinking due to vomiting.      Past Medical History:  Diagnosis Date   Hypertension    Hypothyroidism    Liver disease    Uterine fibroid     Patient Active Problem List   Diagnosis Date Noted   Acute metabolic encephalopathy 71/24/5809   Seizure ([place]) 03/16/2021   Leukocytosis 03/16/2021   AKI (acute kidney injury) ([place]) 03/16/2021   Hyperammonemia ([place]) 03/16/2021   Elevated CK 11/15/2020   Bacterial conjunctivitis of both eyes 11/15/2020   Prolonged Q-T interval on ECG 11/14/2020   Intractable vomiting with nausea 11/14/2020   Jaundice    Transaminitis    Hypomagnesemia    Swelling of lower extremity    Folate deficiency    Rhabdomyolysis 07/20/2020   Malnutrition of moderate degree 07/19/2020   Acute lower UTI 07/18/2020   Dehydration 07/18/2020   Hyperbilirubinemia 07/18/2020    Acidosis, metabolic 98/33/8250   Abnormal liver function 07/18/2020   Macrocytic anemia 07/18/2020   Iron excess 07/18/2020   Gastroenteritis 04/07/2020   Nephrolithiasis 04/07/2020   HTN (hypertension) 04/07/2020   Intractable nausea and vomiting 04/06/2020   Depression 04/06/2020   Hypokalemia 04/06/2020   Hypothyroidism 04/06/2020   Hyperlipidemia 04/06/2020    Past Surgical History:  Procedure Laterality Date   hand fracture surgery Right    LIVER BIOPSY       OB History   No obstetric history on file.     Family History  Problem Relation Age of Onset   Hypertension Other    Hypertension Mother    High Cholesterol Mother     Social History   Tobacco Use   Smoking status: Never   Smokeless tobacco: Never  Vaping Use   Vaping Use: Some days   Start date: 09/09/2020  Substance Use Topics   Alcohol use: Not Currently    Comment: last drink was approximately 5 months ago   Drug use: Never    Home Medications Prior to Admission medications   Medication Sig Start Date End Date Taking? Authorizing Provider  albuterol (VENTOLIN HFA) 108 (90 Base) MCG/ACT inhaler Inhale 2 puffs into the lungs 4 (four) times daily as needed for wheezing or shortness of breath. 12/20/19   [provider]  amLODipine (NORVASC) 10 MG tablet Take 1 tablet (10 mg total) by mouth daily. 11/21/20   Mercy Riding, MD  aspirin-acetaminophen-caffeine (EXCEDRIN MIGRAINE) [phone] MG tablet Take 1 tablet by mouth every 6 (six) hours as needed for headache.    [provider]  Galcanezumab-gnlm (EMGALITY) 120 MG/ML SOAJ Inject 120 mg into the skin every 30 (thirty) days.    [provider]  levETIRAcetam (KEPPRA) 500 MG tablet Take 1 tablet (500 mg total) by mouth 2 (two) times daily. 03/20/21   Hosie Poisson, MD  levothyroxine (SYNTHROID) 75 MCG tablet Take 1 tablet (75 mcg total) by mouth daily at 6 (six) AM. 03/21/21   Hosie Poisson, MD  norethindrone-ethinyl estradiol  (NORTREL 7/7/7) 0.5/0.75/1-35 MG-MCG tablet Take 1 tablet by mouth daily.    [provider]  ondansetron (ZOFRAN) 4 MG tablet Take 1 tablet (4 mg total) by mouth daily as needed for nausea or vomiting. 11/20/20 11/20/21  Mercy Riding, MD  potassium chloride SA (KLOR-CON) 20 MEQ tablet Take 1 tablet (20 mEq total) by mouth daily. 03/20/21   Hosie Poisson, MD  venlafaxine XR (EFFEXOR-XR) 75 MG 24 hr capsule Take 1 capsule (75 mg total) by mouth daily. 11/20/20   Mercy Riding, MD    Allergies    Compazine [prochlorperazine edisylate], Periactin [cyproheptadine], Cyclobenzaprine, Haloperidol and related, and Metoclopramide  Review of Systems   Review of Systems  Gastrointestinal:  Positive for vomiting.  All other systems reviewed and are negative.  Physical Exam Updated Vital Signs BP (!) 144/72    Pulse 86    Temp 98.3 F (36.8 C) (Oral)    Resp 20    LMP 06/11/2018    SpO2 96%   Physical Exam Vitals and nursing note reviewed.  Constitutional:      Appearance: She is well-developed.  HENT:     Head: Normocephalic and atraumatic.  Cardiovascular:     Rate and Rhythm: Normal rate and regular rhythm.  Pulmonary:     Effort: Pulmonary effort is normal. No respiratory distress.  Abdominal:     Palpations: Abdomen is soft.     Tenderness: There is no guarding or rebound.     Comments: Mild to moderate RUQ tenderness  Musculoskeletal:        General: No tenderness.  Skin:    General: Skin is warm and dry.  Neurological:     Mental Status: She is alert and oriented to person, place, and time.  Psychiatric:        Behavior: Behavior normal.    ED Results / Procedures / Treatments   Labs (all labs ordered are listed, but only abnormal results are displayed) Labs Reviewed  CBC WITH DIFFERENTIAL/PLATELET - Abnormal; Notable for the following components:      Result Value   Hemoglobin 10.3 (*)    HCT 32.9 (*)    MCH 25.6 (*)    RDW 19.2 (*)    All other components within  normal limits  COMPREHENSIVE METABOLIC PANEL - Abnormal; Notable for the following components:   Potassium 2.6 (*)    Chloride 96 (*)    CO2 17 (*)    Glucose, Bld 183 (*)    Total Protein 8.8 (*)    AST 80 (*)    Alkaline Phosphatase 128 (*)    Total Bilirubin 2.5 (*)    Anion gap 24 (*)    All other components within normal limits  COMPREHENSIVE METABOLIC PANEL - Abnormal; Notable for  the following components:   Potassium 2.4 (*)    Chloride 95 (*)    CO2 21 (*)    Glucose, Bld 118 (*)    Creatinine, Ser 1.03 (*)    Total Protein 9.0 (*)    Albumin 5.1 (*)    AST 61 (*)    Total Bilirubin 2.7 (*)    Anion gap 19 (*)    All other components within normal limits  BLOOD GAS, VENOUS - Abnormal; Notable for the following components:   pH, Ven 7.449 (*)    pCO2, Ven 33.1 (*)    All other components within normal limits  LIPASE, BLOOD  ETHANOL  MAGNESIUM  URINALYSIS, ROUTINE W REFLEX MICROSCOPIC  TROPONIN I (HIGH SENSITIVITY)    EKG EKG Interpretation  Date/Time:  Saturday July 25 2021 05:02:04 EST Ventricular Rate:  80 PR Interval:  136 QRS Duration: 90 QT Interval:  417 QTC Calculation: 482 R Axis:   71 Text Interpretation: Sinus rhythm Nonspecific T abnormalities, lateral leads Confirmed by Dene Gentry [phone]) on 07/25/2021 7:15:07 AM  Radiology CT Abdomen Pelvis W Contrast  Result Date: 07/25/2021 CLINICAL DATA:  Nausea, vomiting, anorexia EXAM: CT ABDOMEN AND PELVIS WITH CONTRAST TECHNIQUE: Multidetector CT imaging of the abdomen and pelvis was performed using the standard protocol following bolus administration of intravenous contrast. CONTRAST:  69mL OMNIPAQUE IOHEXOL 350 MG/ML SOLN COMPARISON:  03/16/2021 FINDINGS: Lower chest: No acute abnormality. Hepatobiliary: Marked hepatic steatosis. Focal fatty deposition within the gallbladder fossa. No focal enhancing intrahepatic mass identified. No intra or extrahepatic biliary ductal dilation. Gallbladder  unremarkable. Pancreas: Unremarkable Spleen: Unremarkable Adrenals/Urinary Tract: The adrenal glands are unremarkable. The kidneys are normal in size and position. 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 4 mm nonobstructing calculus noted within the lower pole of the right kidney. No hydronephrosis. No ureteral calculi. Nondependent foci of gas within the bladder lumen may relate to recent catheterization. Stomach/Bowel: Stomach is within normal limits. Appendix appears normal. No evidence of bowel wall thickening, distention, or inflammatory changes. Vascular/Lymphatic: Mild atherosclerotic calcification within the abdominal aorta. No aortic aneurysm. Variant anatomy with indirect retroaortic left renal vein via the azygous and hemi azygous veins. No pathologic adenopathy within the abdomen and pelvis. Reproductive: Lobulated appearance of the uterus secondary to multiple enhancing, partially calcified masses in keeping with multiple uterine fibroids. No adnexal masses are seen. Other: No abdominal wall hernia. Musculoskeletal: No acute bone abnormality IMPRESSION: No acute intra-abdominal pathology identified. No definite radiographic explanation for the patient's reported symptoms. Marked hepatic steatosis. 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 Mild right nonobstructing nephrolithiasis. Aortic Atherosclerosis (ICD10-I70.0). Electronically Signed   By: Fidela Salisbury M.D.   On: 07/25/2021 03:58   US Abdomen Limited RUQ (LIVER/GB)  Result Date: 07/24/2021 CLINICAL DATA:  Right upper quadrant pain for 3 weeks. EXAM: ULTRASOUND ABDOMEN LIMITED RIGHT UPPER QUADRANT COMPARISON:  CT abdomen and pelvis 03/16/2021. Right upper quadrant ultrasound 11/15/2020. FINDINGS: Gallbladder: Similar gallbladder distension compared to the prior studies. No gallstones or wall thickening visualized. No sonographic Murphy sign noted by sonographer. Common bile duct: Diameter: 4 mm Liver: Diffusely  increased parenchymal echogenicity with attenuation of the ultrasound beam, limiting evaluation for a mass or other focal abnormality. Portal vein is patent on color Doppler imaging with normal direction of blood flow towards the liver. Other: None. IMPRESSION: 1. Chronically distended gallbladder without gallstones or other evidence of cholecystitis. 2. Echogenic liver, nonspecific but compatible with steatosis. Electronically Signed   By: Zenia Resides  Jeralyn Ruths M.D.   On: 07/24/2021 16:35    Procedures Procedures   Medications Ordered in ED Medications  lactated ringers bolus 1,000 mL (0 mLs Intravenous Stopped 07/25/21 0506)  LORazepam (ATIVAN) injection 1 mg (1 mg Intravenous Given 07/25/21 0252)  iohexol (OMNIPAQUE) 350 MG/ML injection 80 mL (80 mLs Intravenous Contrast Given 07/25/21 0328)  potassium chloride 10 mEq in 100 mL IVPB (10 mEq Intravenous New Bag/Given 07/25/21 0620)  ondansetron (ZOFRAN) injection 4 mg (4 mg Intravenous Given 07/25/21 0506)  lactated ringers bolus 1,000 mL (1,000 mLs Intravenous New Bag/Given 07/25/21 0658)  levETIRAcetam (KEPPRA) tablet 500 mg (500 mg Oral Given 07/25/21 0658)  potassium chloride (KLOR-CON) packet 60 mEq (60 mEq Oral Given 07/25/21 8768)    ED Course  I have reviewed the triage vital signs and the nursing notes.  Pertinent labs & imaging results that were available during my care of the patient were reviewed by me and considered in my medical decision making (see chart for details).    MDM Rules/Calculators/A&P                         Patient here for evaluation of vomiting intermittently for several weeks, significantly for the last few days.  Labs significant for hypokalemia, elevated anion gap.  Hemoglobin is at her baseline with a stable anemia.  Right upper quadrant ultrasound with chronic changes, no evidence of cholecystitis.  She was treated with IV fluids, Ativan, antiemetic and potassium supplementation.  On repeat assessment she is  feeling improved without recurrent vomiting.  Will attempt to trial oral fluids.  Repeat labs were obtained due to patient's prolonged ED stay.  Repeat labs were obtained prior to her receiving any IV interventions.  Labs are currently not consistent with acute metabolic acidosis.  Anion gap is improving on recheck labs.  She does have worsening hypokalemia.  Given patient's improvement in ability to tolerate oral will provide her home Keppra dose as well as oral potassium replacement as well on repeat assessment patient continues to be improved.  CT scan without evidence of bowel obstruction.  Patient care transferred pending urinalysis.  If patient continues to be able to take fluids by mouth and continues to improve she may be candidate for discharge with outpatient follow-up.    Final Clinical Impression(s) / ED Diagnoses Final diagnoses:  RUQ pain  Hypokalemia    Rx / DC Orders ED Discharge Orders     None        Quintella Reichert, MD 07/25/21 [phone]

## 2021-07-25 NOTE — ED Notes (Signed)
Attempted to get patient to provide urine sample. Patient said "I can't right now."

## 2021-07-25 NOTE — ED Provider Notes (Signed)
Seen after prior EDP  She is tolerating p.o.  She is improved.  She desires DC home.  Potassium supplementation is complete.  Patient offered continued ED observation and/or admission.  She declined same.  She does understand need for close follow-up.  She understands need for recheck of potassium in 1 to 2 weeks with her PCP in the outpatient setting.   Valarie Merino, MD 07/25/21 [phone]

## 2021-07-25 NOTE — Discharge Instructions (Addendum)
Return for any problem.  ?

## 2021-10-03 ENCOUNTER — Inpatient Hospital Stay (HOSPITAL_COMMUNITY): Payer: Medicare PPO

## 2021-10-03 ENCOUNTER — Inpatient Hospital Stay (HOSPITAL_COMMUNITY)
Admission: EM | Admit: 2021-10-03 | Discharge: 2021-11-07 | DRG: 871 | Disposition: E | Payer: Medicare PPO | Attending: Critical Care Medicine | Admitting: Critical Care Medicine

## 2021-10-03 ENCOUNTER — Encounter (HOSPITAL_COMMUNITY): Payer: Self-pay | Admitting: Emergency Medicine

## 2021-10-03 ENCOUNTER — Other Ambulatory Visit: Payer: Self-pay

## 2021-10-03 ENCOUNTER — Emergency Department (HOSPITAL_COMMUNITY): Payer: Medicare PPO

## 2021-10-03 DIAGNOSIS — K7010 Alcoholic hepatitis without ascites: Secondary | ICD-10-CM

## 2021-10-03 DIAGNOSIS — K2970 Gastritis, unspecified, without bleeding: Secondary | ICD-10-CM | POA: Diagnosis present

## 2021-10-03 DIAGNOSIS — Z789 Other specified health status: Secondary | ICD-10-CM | POA: Diagnosis not present

## 2021-10-03 DIAGNOSIS — K7200 Acute and subacute hepatic failure without coma: Secondary | ICD-10-CM | POA: Diagnosis present

## 2021-10-03 DIAGNOSIS — I97638 Postprocedural hematoma of a circulatory system organ or structure following other circulatory system procedure: Secondary | ICD-10-CM | POA: Diagnosis not present

## 2021-10-03 DIAGNOSIS — E876 Hypokalemia: Secondary | ICD-10-CM | POA: Diagnosis not present

## 2021-10-03 DIAGNOSIS — Y907 Blood alcohol level of 200-239 mg/100 ml: Secondary | ICD-10-CM | POA: Diagnosis present

## 2021-10-03 DIAGNOSIS — R571 Hypovolemic shock: Secondary | ICD-10-CM | POA: Diagnosis not present

## 2021-10-03 DIAGNOSIS — R109 Unspecified abdominal pain: Secondary | ICD-10-CM

## 2021-10-03 DIAGNOSIS — E8721 Acute metabolic acidosis: Secondary | ICD-10-CM | POA: Diagnosis not present

## 2021-10-03 DIAGNOSIS — R6521 Severe sepsis with septic shock: Secondary | ICD-10-CM | POA: Diagnosis not present

## 2021-10-03 DIAGNOSIS — G43909 Migraine, unspecified, not intractable, without status migrainosus: Secondary | ICD-10-CM | POA: Diagnosis not present

## 2021-10-03 DIAGNOSIS — D72829 Elevated white blood cell count, unspecified: Secondary | ICD-10-CM | POA: Diagnosis present

## 2021-10-03 DIAGNOSIS — K311 Adult hypertrophic pyloric stenosis: Secondary | ICD-10-CM | POA: Diagnosis present

## 2021-10-03 DIAGNOSIS — K922 Gastrointestinal hemorrhage, unspecified: Secondary | ICD-10-CM

## 2021-10-03 DIAGNOSIS — Z793 Long term (current) use of hormonal contraceptives: Secondary | ICD-10-CM

## 2021-10-03 DIAGNOSIS — R32 Unspecified urinary incontinence: Secondary | ICD-10-CM | POA: Diagnosis present

## 2021-10-03 DIAGNOSIS — K648 Other hemorrhoids: Secondary | ICD-10-CM | POA: Diagnosis present

## 2021-10-03 DIAGNOSIS — Z6822 Body mass index (BMI) 22.0-22.9, adult: Secondary | ICD-10-CM

## 2021-10-03 DIAGNOSIS — Z4659 Encounter for fitting and adjustment of other gastrointestinal appliance and device: Secondary | ICD-10-CM

## 2021-10-03 DIAGNOSIS — E785 Hyperlipidemia, unspecified: Secondary | ICD-10-CM | POA: Diagnosis present

## 2021-10-03 DIAGNOSIS — E8809 Other disorders of plasma-protein metabolism, not elsewhere classified: Secondary | ICD-10-CM | POA: Diagnosis present

## 2021-10-03 DIAGNOSIS — G40909 Epilepsy, unspecified, not intractable, without status epilepticus: Secondary | ICD-10-CM | POA: Diagnosis present

## 2021-10-03 DIAGNOSIS — Z20822 Contact with and (suspected) exposure to covid-19: Secondary | ICD-10-CM | POA: Diagnosis present

## 2021-10-03 DIAGNOSIS — K821 Hydrops of gallbladder: Secondary | ICD-10-CM | POA: Diagnosis present

## 2021-10-03 DIAGNOSIS — I119 Hypertensive heart disease without heart failure: Secondary | ICD-10-CM | POA: Diagnosis present

## 2021-10-03 DIAGNOSIS — K767 Hepatorenal syndrome: Secondary | ICD-10-CM | POA: Diagnosis present

## 2021-10-03 DIAGNOSIS — Z66 Do not resuscitate: Secondary | ICD-10-CM | POA: Diagnosis not present

## 2021-10-03 DIAGNOSIS — Z515 Encounter for palliative care: Secondary | ICD-10-CM | POA: Diagnosis not present

## 2021-10-03 DIAGNOSIS — K810 Acute cholecystitis: Secondary | ICD-10-CM | POA: Diagnosis present

## 2021-10-03 DIAGNOSIS — R578 Other shock: Secondary | ICD-10-CM | POA: Diagnosis not present

## 2021-10-03 DIAGNOSIS — K7682 Hepatic encephalopathy: Secondary | ICD-10-CM | POA: Diagnosis present

## 2021-10-03 DIAGNOSIS — F05 Delirium due to known physiological condition: Secondary | ICD-10-CM | POA: Diagnosis not present

## 2021-10-03 DIAGNOSIS — D684 Acquired coagulation factor deficiency: Secondary | ICD-10-CM | POA: Diagnosis present

## 2021-10-03 DIAGNOSIS — D6959 Other secondary thrombocytopenia: Secondary | ICD-10-CM | POA: Diagnosis present

## 2021-10-03 DIAGNOSIS — E162 Hypoglycemia, unspecified: Secondary | ICD-10-CM | POA: Diagnosis not present

## 2021-10-03 DIAGNOSIS — K709 Alcoholic liver disease, unspecified: Secondary | ICD-10-CM | POA: Diagnosis not present

## 2021-10-03 DIAGNOSIS — K625 Hemorrhage of anus and rectum: Secondary | ICD-10-CM | POA: Diagnosis not present

## 2021-10-03 DIAGNOSIS — D638 Anemia in other chronic diseases classified elsewhere: Secondary | ICD-10-CM | POA: Diagnosis present

## 2021-10-03 DIAGNOSIS — Z7141 Alcohol abuse counseling and surveillance of alcoholic: Secondary | ICD-10-CM

## 2021-10-03 DIAGNOSIS — Z9911 Dependence on respirator [ventilator] status: Secondary | ICD-10-CM | POA: Diagnosis not present

## 2021-10-03 DIAGNOSIS — R64 Cachexia: Secondary | ICD-10-CM | POA: Diagnosis present

## 2021-10-03 DIAGNOSIS — J9601 Acute respiratory failure with hypoxia: Secondary | ICD-10-CM | POA: Diagnosis not present

## 2021-10-03 DIAGNOSIS — R932 Abnormal findings on diagnostic imaging of liver and biliary tract: Secondary | ICD-10-CM

## 2021-10-03 DIAGNOSIS — K529 Noninfective gastroenteritis and colitis, unspecified: Secondary | ICD-10-CM | POA: Diagnosis present

## 2021-10-03 DIAGNOSIS — K7011 Alcoholic hepatitis with ascites: Secondary | ICD-10-CM | POA: Diagnosis present

## 2021-10-03 DIAGNOSIS — K2971 Gastritis, unspecified, with bleeding: Secondary | ICD-10-CM | POA: Diagnosis not present

## 2021-10-03 DIAGNOSIS — E440 Moderate protein-calorie malnutrition: Secondary | ICD-10-CM | POA: Diagnosis present

## 2021-10-03 DIAGNOSIS — E039 Hypothyroidism, unspecified: Secondary | ICD-10-CM | POA: Diagnosis present

## 2021-10-03 DIAGNOSIS — S301XXA Contusion of abdominal wall, initial encounter: Secondary | ICD-10-CM | POA: Diagnosis not present

## 2021-10-03 DIAGNOSIS — K7030 Alcoholic cirrhosis of liver without ascites: Secondary | ICD-10-CM | POA: Diagnosis not present

## 2021-10-03 DIAGNOSIS — Z9114 Patient's other noncompliance with medication regimen: Secondary | ICD-10-CM

## 2021-10-03 DIAGNOSIS — N179 Acute kidney failure, unspecified: Secondary | ICD-10-CM | POA: Diagnosis not present

## 2021-10-03 DIAGNOSIS — Z638 Other specified problems related to primary support group: Secondary | ICD-10-CM

## 2021-10-03 DIAGNOSIS — K7201 Acute and subacute hepatic failure with coma: Secondary | ICD-10-CM

## 2021-10-03 DIAGNOSIS — N170 Acute kidney failure with tubular necrosis: Secondary | ICD-10-CM | POA: Diagnosis not present

## 2021-10-03 DIAGNOSIS — K623 Rectal prolapse: Secondary | ICD-10-CM | POA: Diagnosis not present

## 2021-10-03 DIAGNOSIS — R601 Generalized edema: Secondary | ICD-10-CM | POA: Diagnosis not present

## 2021-10-03 DIAGNOSIS — F1010 Alcohol abuse, uncomplicated: Secondary | ICD-10-CM | POA: Diagnosis present

## 2021-10-03 DIAGNOSIS — J90 Pleural effusion, not elsewhere classified: Secondary | ICD-10-CM | POA: Diagnosis present

## 2021-10-03 DIAGNOSIS — E871 Hypo-osmolality and hyponatremia: Secondary | ICD-10-CM | POA: Diagnosis present

## 2021-10-03 DIAGNOSIS — R159 Full incontinence of feces: Secondary | ICD-10-CM | POA: Diagnosis not present

## 2021-10-03 DIAGNOSIS — L24A2 Irritant contact dermatitis due to fecal, urinary or dual incontinence: Secondary | ICD-10-CM | POA: Diagnosis present

## 2021-10-03 DIAGNOSIS — Z79899 Other long term (current) drug therapy: Secondary | ICD-10-CM

## 2021-10-03 DIAGNOSIS — K7031 Alcoholic cirrhosis of liver with ascites: Secondary | ICD-10-CM | POA: Diagnosis not present

## 2021-10-03 DIAGNOSIS — F32A Depression, unspecified: Secondary | ICD-10-CM | POA: Diagnosis present

## 2021-10-03 DIAGNOSIS — Z7982 Long term (current) use of aspirin: Secondary | ICD-10-CM

## 2021-10-03 DIAGNOSIS — E872 Acidosis, unspecified: Secondary | ICD-10-CM | POA: Diagnosis not present

## 2021-10-03 DIAGNOSIS — K766 Portal hypertension: Secondary | ICD-10-CM | POA: Diagnosis present

## 2021-10-03 DIAGNOSIS — K921 Melena: Secondary | ICD-10-CM | POA: Diagnosis not present

## 2021-10-03 DIAGNOSIS — R1084 Generalized abdominal pain: Secondary | ICD-10-CM | POA: Diagnosis not present

## 2021-10-03 DIAGNOSIS — K7460 Unspecified cirrhosis of liver: Secondary | ICD-10-CM | POA: Diagnosis present

## 2021-10-03 DIAGNOSIS — D689 Coagulation defect, unspecified: Secondary | ICD-10-CM | POA: Diagnosis not present

## 2021-10-03 DIAGNOSIS — A419 Sepsis, unspecified organism: Secondary | ICD-10-CM | POA: Diagnosis not present

## 2021-10-03 DIAGNOSIS — D62 Acute posthemorrhagic anemia: Secondary | ICD-10-CM | POA: Diagnosis present

## 2021-10-03 DIAGNOSIS — Z888 Allergy status to other drugs, medicaments and biological substances status: Secondary | ICD-10-CM

## 2021-10-03 DIAGNOSIS — Z978 Presence of other specified devices: Secondary | ICD-10-CM

## 2021-10-03 DIAGNOSIS — Z79890 Hormone replacement therapy (postmenopausal): Secondary | ICD-10-CM

## 2021-10-03 DIAGNOSIS — I10 Essential (primary) hypertension: Secondary | ICD-10-CM | POA: Diagnosis not present

## 2021-10-03 LAB — PROTIME-INR
INR: 1.6 — ABNORMAL HIGH (ref 0.8–1.2)
Prothrombin Time: 18.7 seconds — ABNORMAL HIGH (ref 11.4–15.2)

## 2021-10-03 LAB — COMPREHENSIVE METABOLIC PANEL
ALT: 80 U/L — ABNORMAL HIGH (ref 0–44)
AST: 317 U/L — ABNORMAL HIGH (ref 15–41)
Albumin: 2.7 g/dL — ABNORMAL LOW (ref 3.5–5.0)
Alkaline Phosphatase: 175 U/L — ABNORMAL HIGH (ref 38–126)
Anion gap: 20 — ABNORMAL HIGH (ref 5–15)
BUN: 10 mg/dL (ref 6–20)
CO2: 17 mmol/L — ABNORMAL LOW (ref 22–32)
Calcium: 8.9 mg/dL (ref 8.9–10.3)
Chloride: 95 mmol/L — ABNORMAL LOW (ref 98–111)
Creatinine, Ser: 1.05 mg/dL — ABNORMAL HIGH (ref 0.44–1.00)
GFR, Estimated: >60 mL/min (ref >60)
Glucose, Bld: 164 mg/dL — ABNORMAL HIGH (ref 70–99)
Potassium: 2.4 mmol/L — CL (ref 3.5–5.1)
Sodium: 132 mmol/L — ABNORMAL LOW (ref 135–145)
Total Bilirubin: 33.5 mg/dL (ref 0.3–1.2)
Total Protein: 7.2 g/dL (ref 6.5–8.1)

## 2021-10-03 LAB — CBC
HCT: 31.6 % — ABNORMAL LOW (ref 36.0–46.0)
Hemoglobin: 10.8 g/dL — ABNORMAL LOW (ref 12.0–15.0)
MCH: 33.2 pg (ref 26.0–34.0)
MCHC: 34.2 g/dL (ref 30.0–36.0)
MCV: 97.2 fL (ref 80.0–100.0)
Platelets: 203 10*3/uL (ref 150–400)
RBC: 3.25 MIL/uL — ABNORMAL LOW (ref 3.87–5.11)
RDW: 32.2 % — ABNORMAL HIGH (ref 11.5–15.5)
WBC: 17.2 10*3/uL — ABNORMAL HIGH (ref 4.0–10.5)
nRBC: 0.7 % — ABNORMAL HIGH (ref 0.0–0.2)

## 2021-10-03 LAB — LACTIC ACID, PLASMA
Lactic Acid, Venous: >9 mmol/L (ref 0.5–1.9)
Lactic Acid, Venous: 8.1 mmol/L (ref 0.5–1.9)

## 2021-10-03 LAB — ETHANOL: Alcohol, Ethyl (B): 216 mg/dL — ABNORMAL HIGH (ref <10)

## 2021-10-03 LAB — AMMONIA: Ammonia: 106 umol/L — ABNORMAL HIGH (ref 9–35)

## 2021-10-03 LAB — I-STAT BETA HCG BLOOD, ED (MC, WL, AP ONLY): I-stat hCG, quantitative: <5 m[IU]/mL (ref <5)

## 2021-10-03 LAB — RESP PANEL BY RT-PCR (FLU A&B, COVID) ARPGX2
Influenza A by PCR: NEGATIVE
Influenza B by PCR: NEGATIVE
SARS Coronavirus 2 by RT PCR: NEGATIVE

## 2021-10-03 LAB — PHOSPHORUS: Phosphorus: 3.2 mg/dL (ref 2.5–4.6)

## 2021-10-03 LAB — LIPASE, BLOOD: Lipase: 93 U/L — ABNORMAL HIGH (ref 11–51)

## 2021-10-03 LAB — MAGNESIUM: Magnesium: 2.4 mg/dL (ref 1.7–2.4)

## 2021-10-03 IMAGING — DX DG CHEST 1V PORT
1 series · 1 of 1 positions shown · non-contrast
Comparison: PA Lat [DATE].

CLINICAL DATA: Jaundice, abdominal pain and sepsis.

EXAM:
PORTABLE CHEST 1 VIEW

[chest]
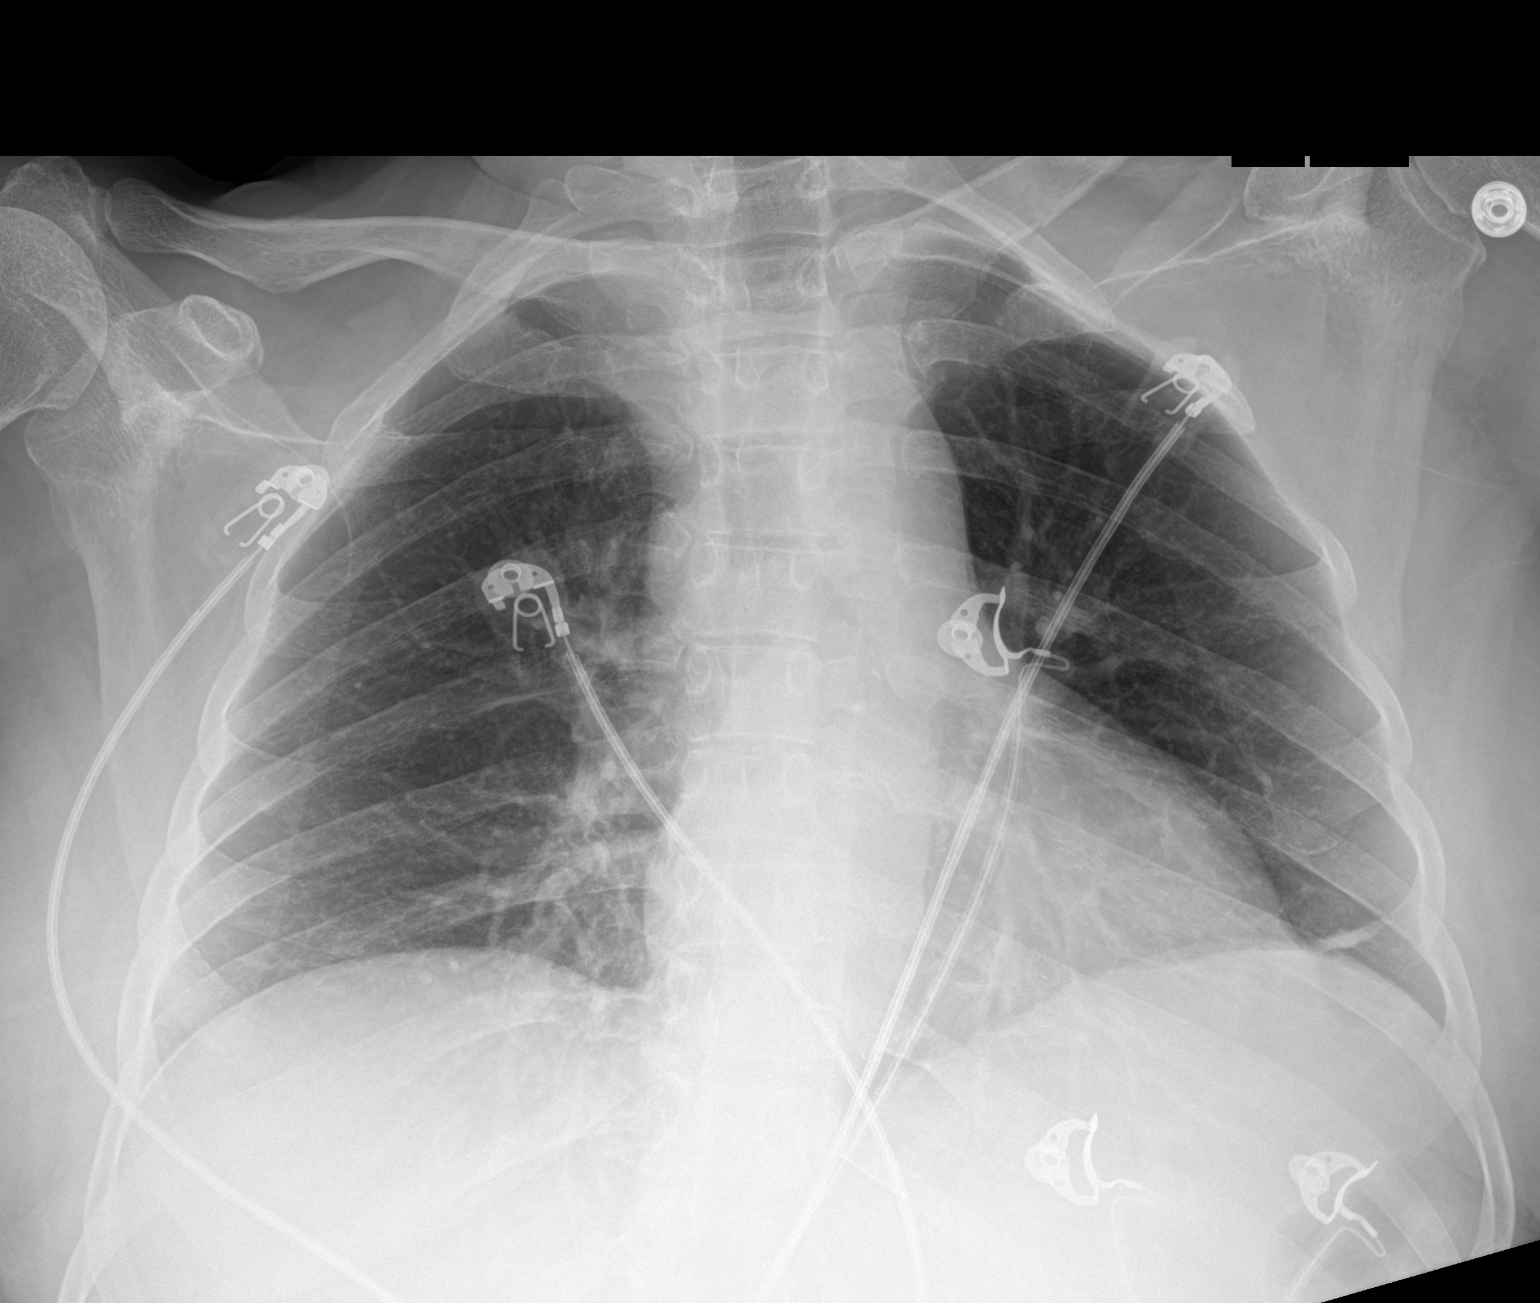

[1 of 1 positions shown; findings below may reference images not displayed]

FINDINGS: The heart size and mediastinal contours are within normal limits.
Both lungs are clear apart from linear scarring or atelectasis
laterally in the left base. The visualized skeletal structures are
unremarkable.
IMPRESSION: No evidence of acute chest disease.

## 2021-10-03 IMAGING — US US ABDOMEN LIMITED
1 series · 14 of 25 positions shown · non-contrast
Comparison: CT [DATE], ultrasound [DATE]

CLINICAL DATA: RIGHT-sided abdominal pain.  Jaundice

EXAM:
ULTRASOUND ABDOMEN LIMITED RIGHT UPPER QUADRANT

[Series 1: us abdomen limited ruq (liver/gb) · 14 of 46 slices shown]
[im 1/46]
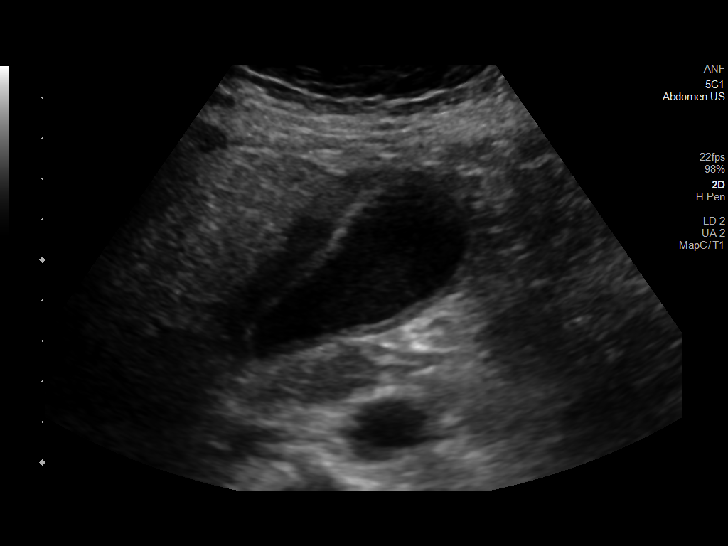
[im 4/46]
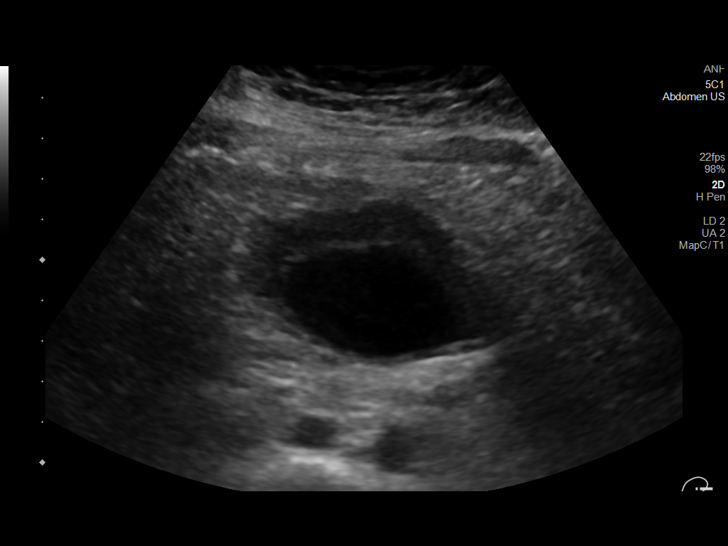
[im 8/46]
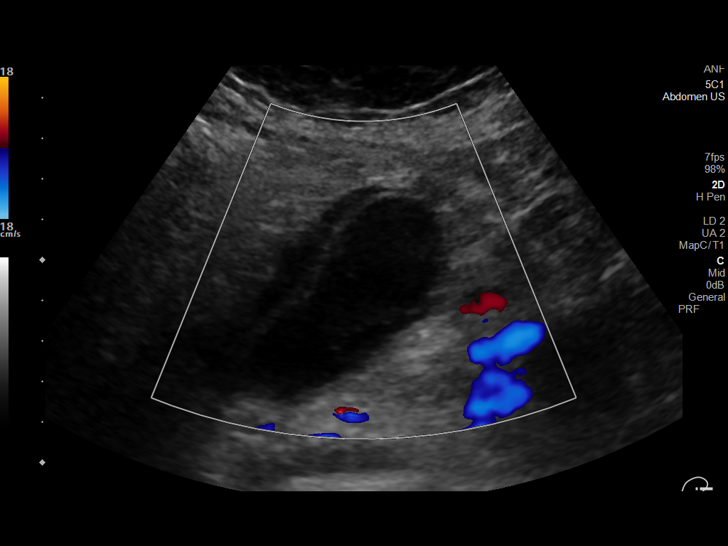
[im 12/46]
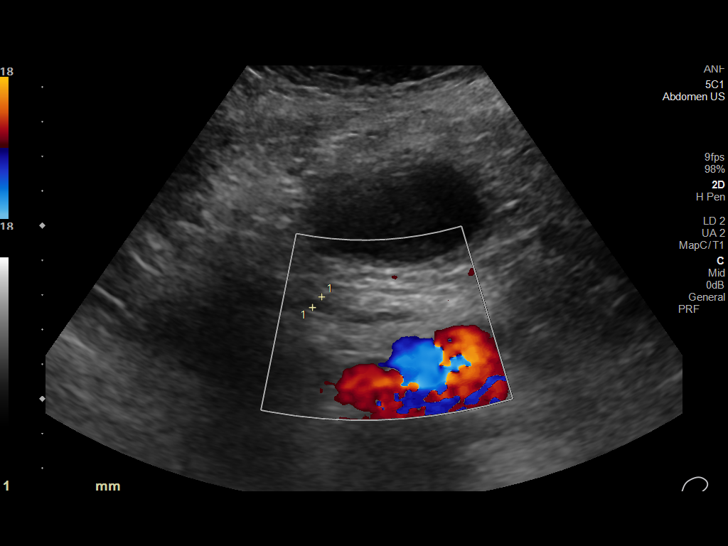
[im 16/46]
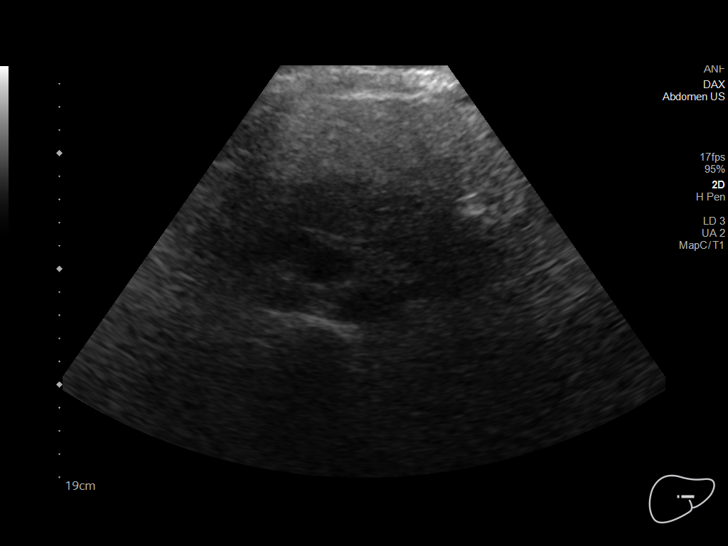
[im 17/46]
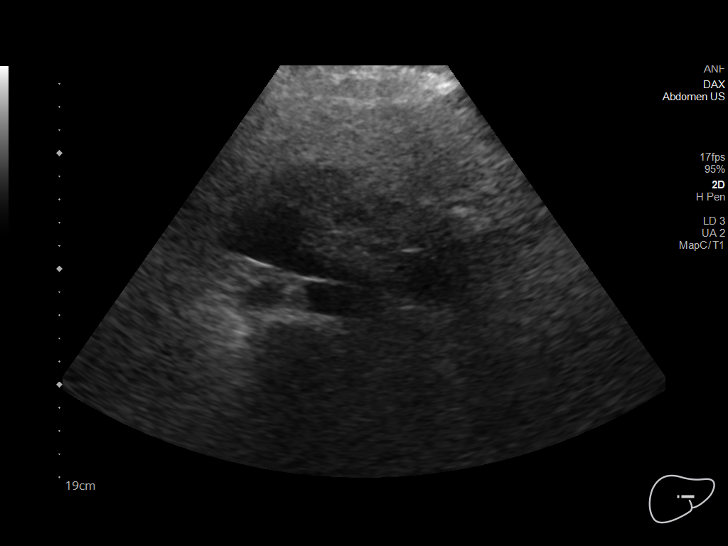
[im 21/46]
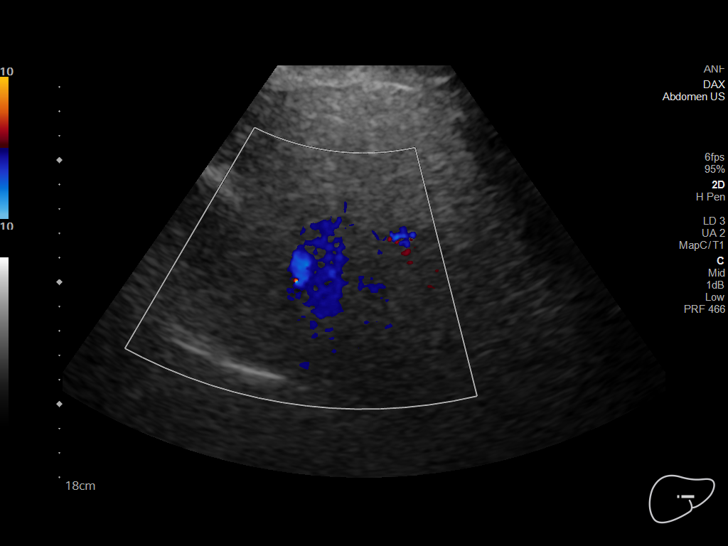
[im 25/46]
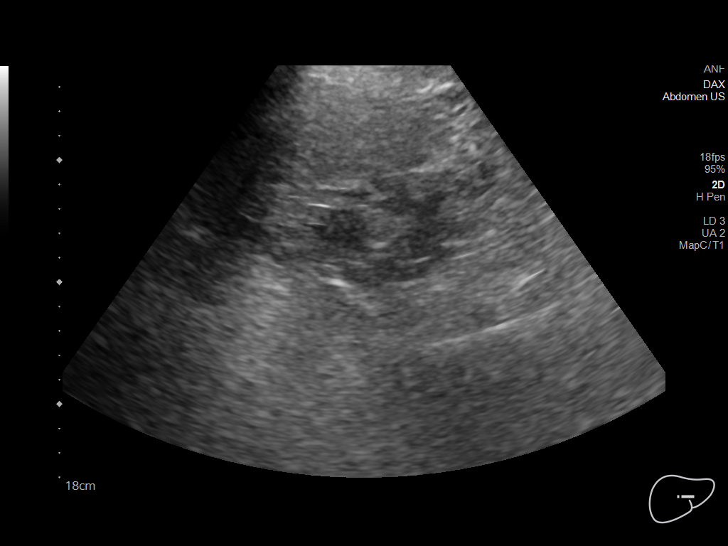
[im 29/46]
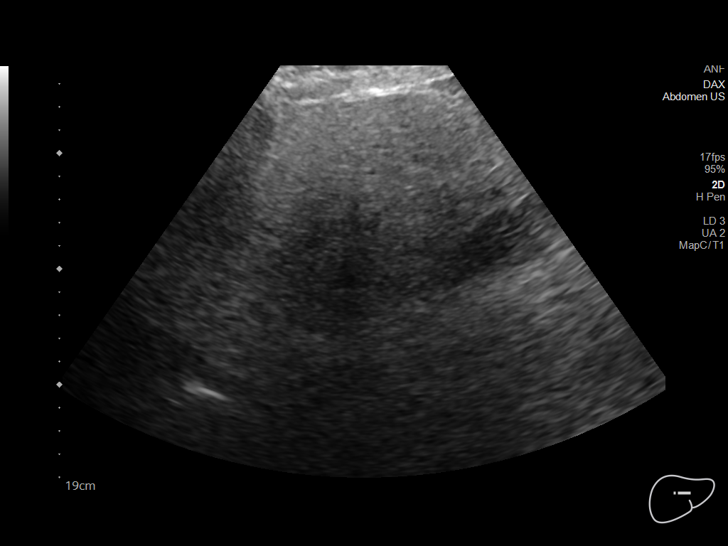
[im 31/46]
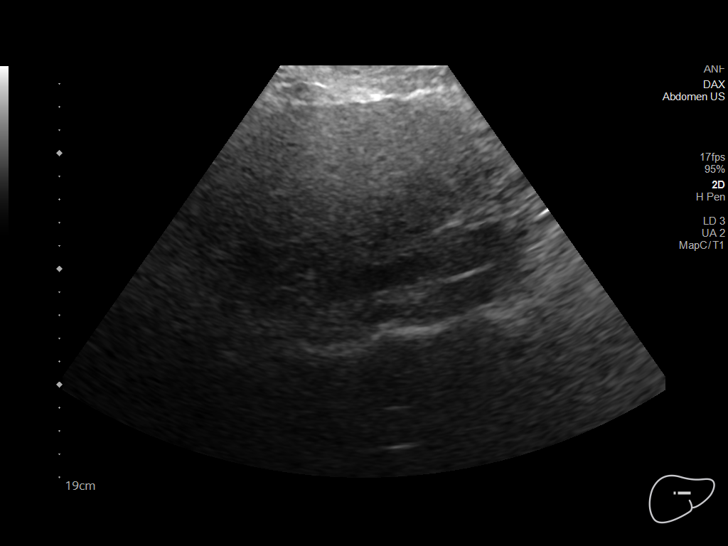
[im 34/46]
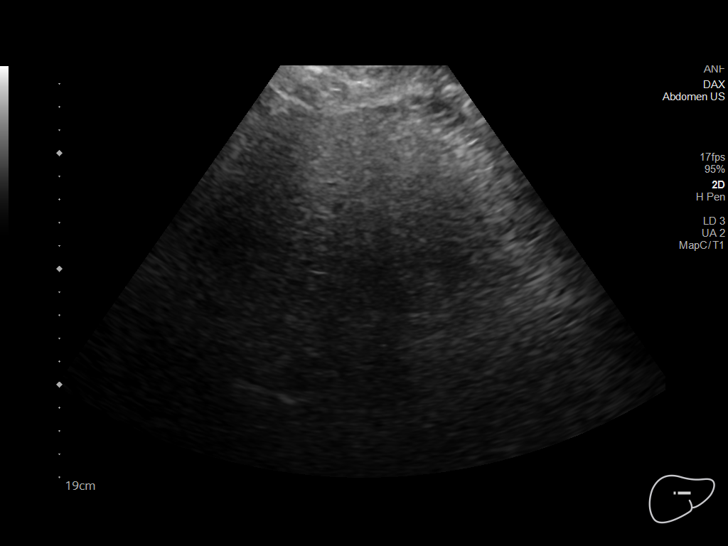
[im 38/46]
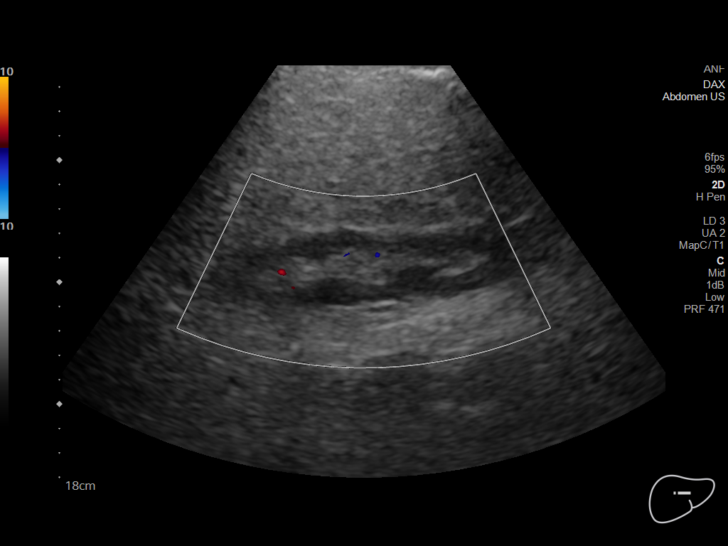
[im 42/46]
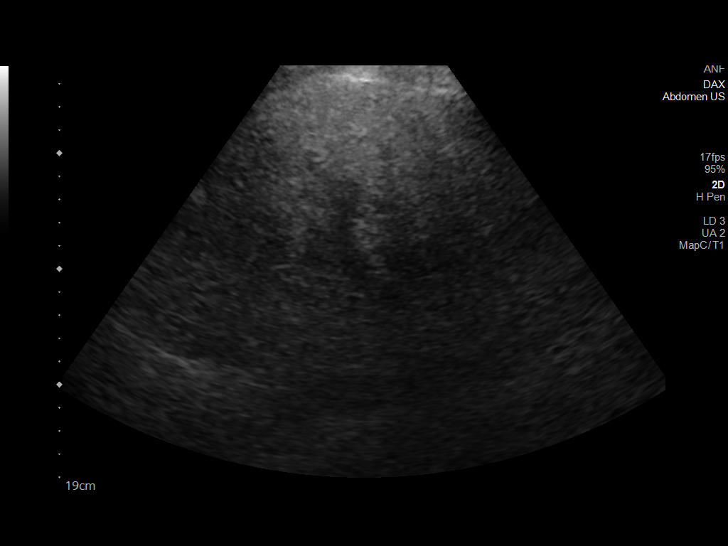
[im 46/46]
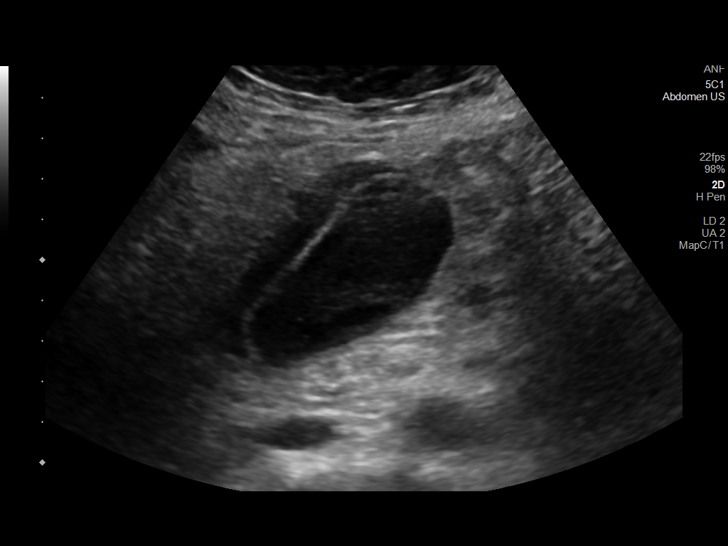

[14 of 25 positions shown; findings below may reference images not displayed]

FINDINGS: Gallbladder:

Sludge within the gallbladder. The gallbladder wall is thickened.
Positive sonographic Murphy's sign.

Common bile duct:

Diameter: Normal at 4 mm

Liver:

Increased echogenicity. Portal vein is patent on color Doppler
imaging with normal direction of blood flow towards the liver.

Other: None.
IMPRESSION: Pericholecystic fluid, gallbladder wall thickening and positive
sonographic Murphy's sign are concerning for acute cholecystitis.

## 2021-10-03 IMAGING — US US HEPATIC LIVER DOPPLER
1 series · 13 of 25 positions shown · non-contrast
Comparison: CT [DATE]

CLINICAL DATA: Jaundice, acute on chronic liver disease

EXAM:
DUPLEX ULTRASOUND OF LIVER
TECHNIQUE: Color and duplex Doppler ultrasound was performed to evaluate the
hepatic in-flow and out-flow vessels.

[Series 1: us liver doppler · 13 of 30 slices shown]
[im 1/30]
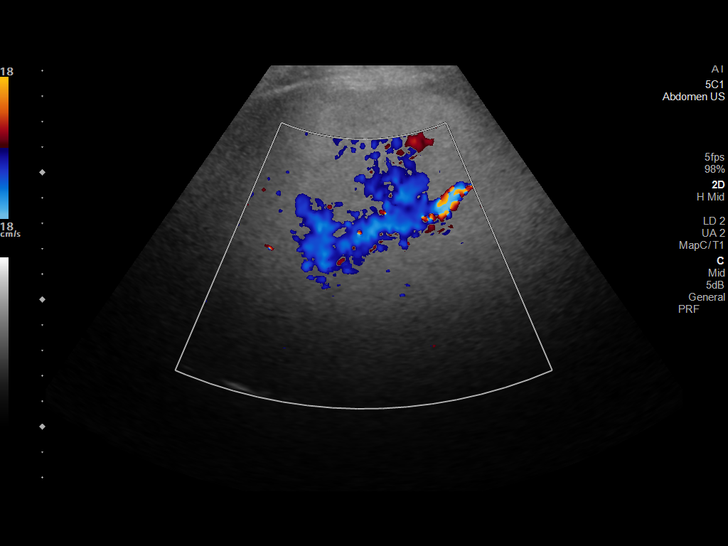
[im 3/30]
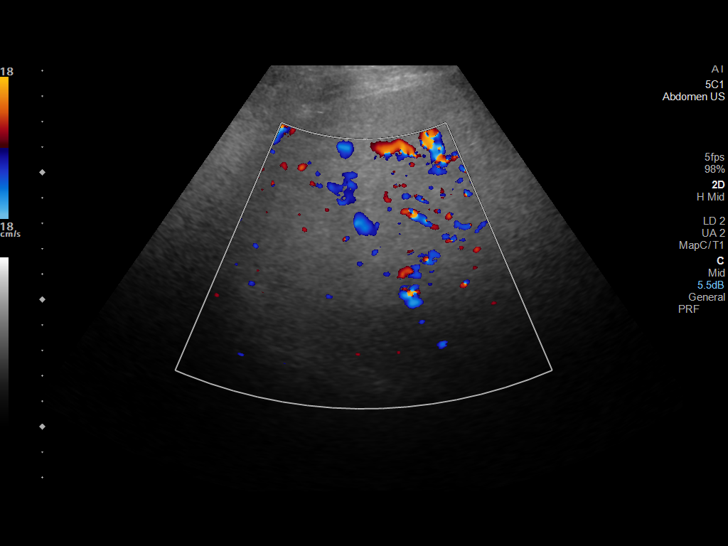
[im 5/30]
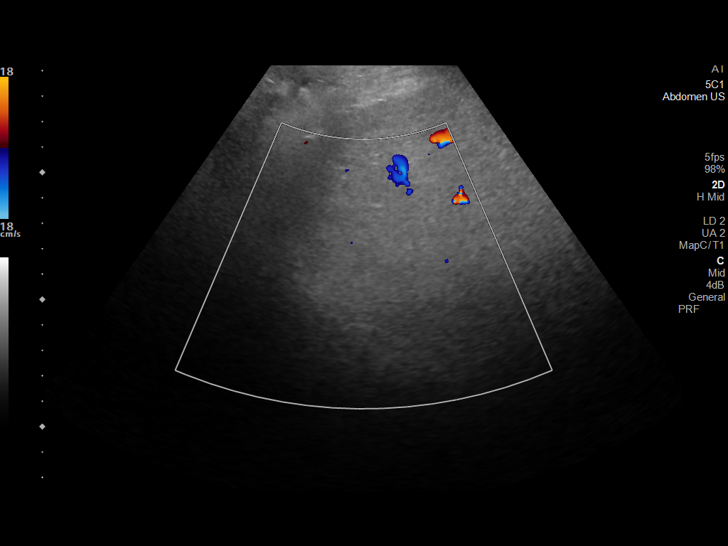
[im 8/30]
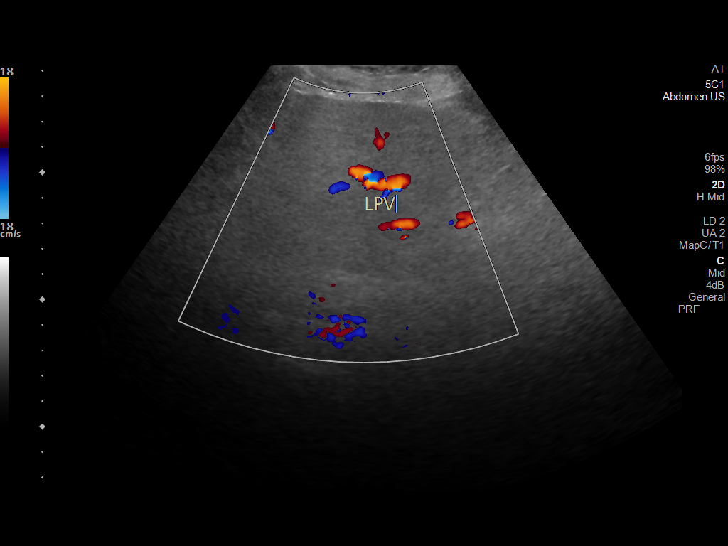
[im 10/30]
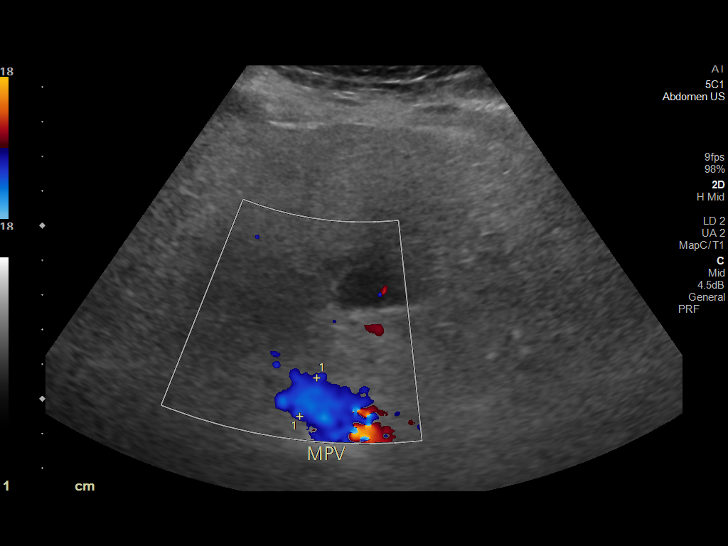
[im 13/30]
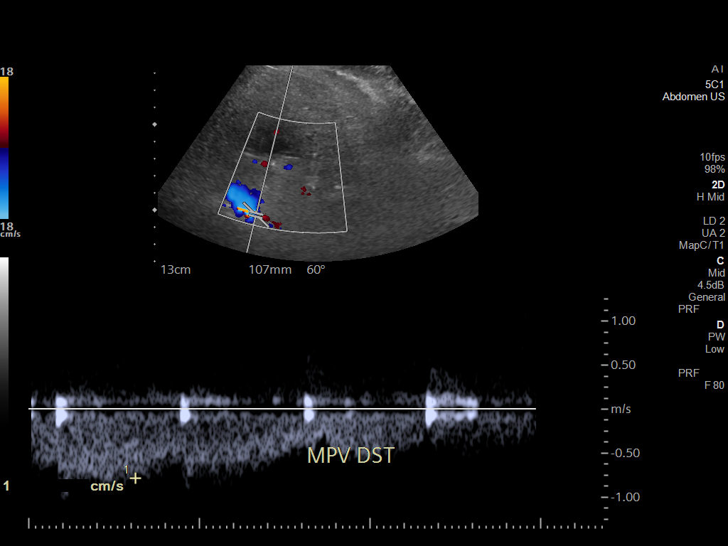
[im 15/30]
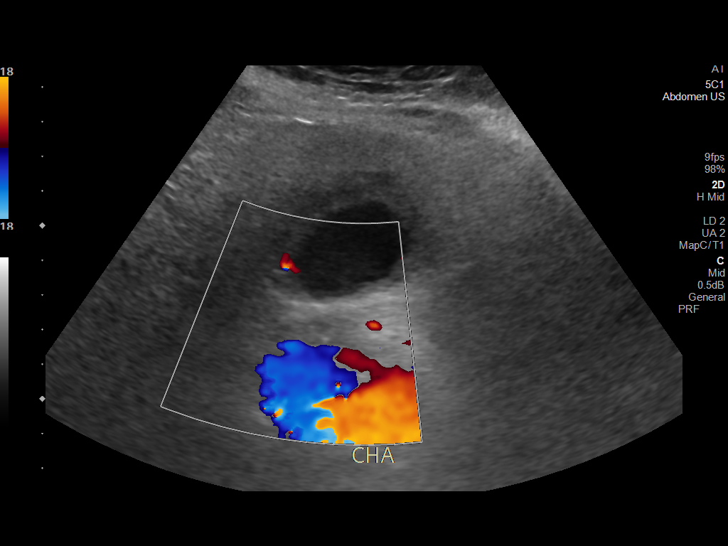
[im 17/30]
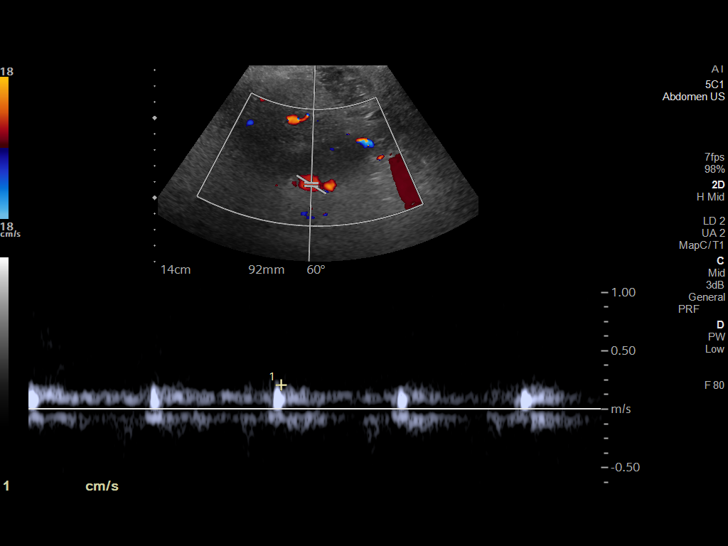
[im 20/30]
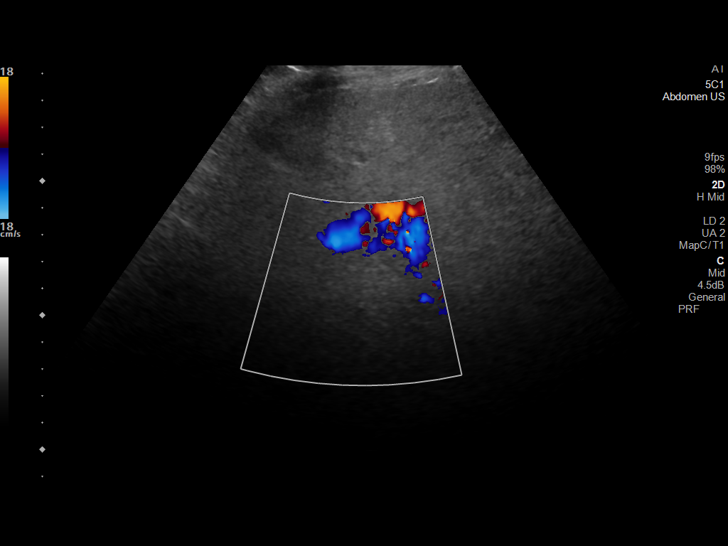
[im 22/30]
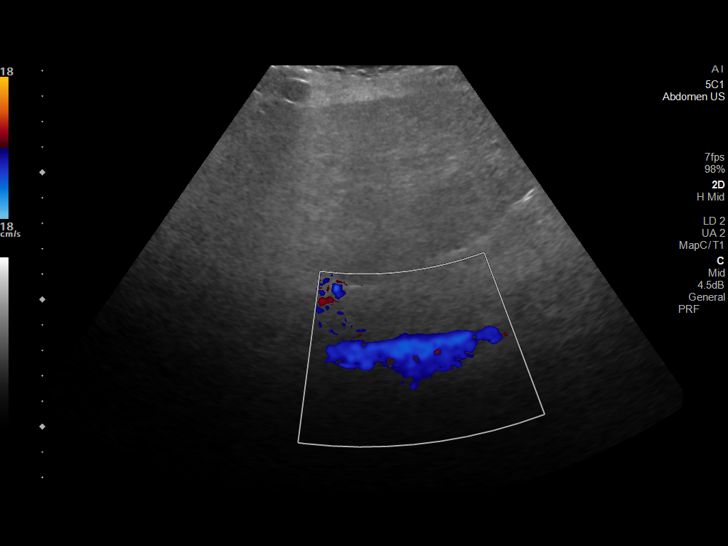
[im 25/30]
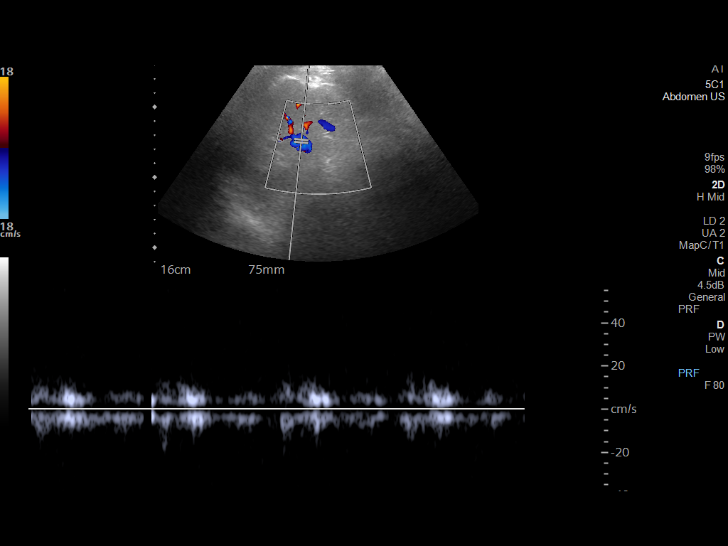
[im 27/30]
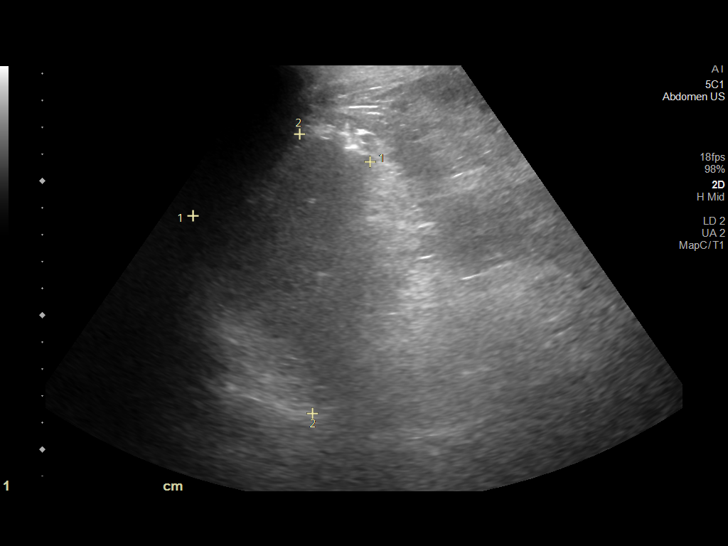
[im 30/30]
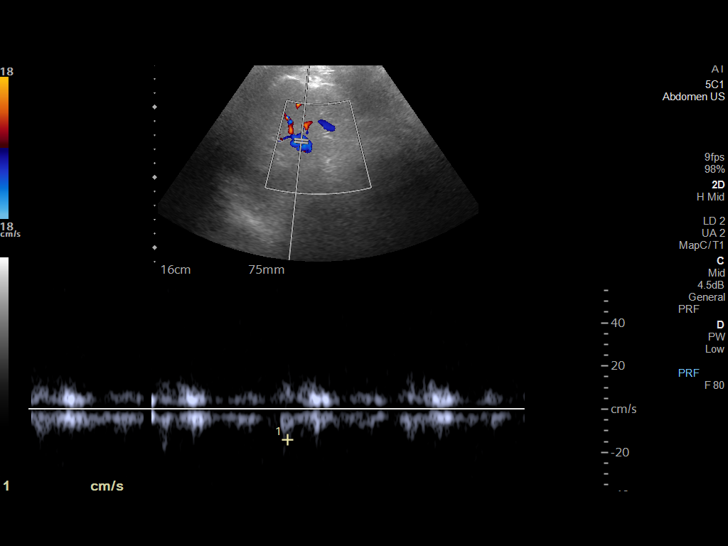

[13 of 25 positions shown; findings below may reference images not displayed]

FINDINGS: Liver: Diffusely increased parenchymal echogenicity with extremely
poor acoustic through transmission in keeping with changes of severe
hepatic steatosis. Assessment of the hepatic parenchyma is
resultantly markedly limited. Limited images of the hepatic contour
demonstrate no obvious nodularity. No definite focal intrahepatic
mass identified.

Main Portal Vein size: 12 mm cm

Portal Vein Velocities

Main Prox:  -63 cm/sec

Main Mid: -55 cm/sec

Main Dist:  -79 cm/sec
Right: -38 cm/sec
Left: 35 cm/sec

Hepatic Vein Velocities

Right:  17 cm/sec

Middle:  30 cm/sec

Left:  24 cm/sec

IVC: Patent

Hepatic Artery Velocity:  67 cm/sec

Splenic Vein Velocity:  14 cm/sec

Spleen: 6.9 cm x 10.4 cm x 8.3 cm with a total volume of 313 cm^3
(411 cm^3 is upper limit normal)

Portal Vein Occlusion/Thrombus: No

Splenic Vein Occlusion/Thrombus: No

Ascites: None

Varices: None

Superior mesenteric vein appears patent centrally.
IMPRESSION: Markedly limited examination secondary to severe hepatic steatosis
and resultant poor acoustic through transmission.

Hepatofugal flow within the right portal vein and main portal vein.
Preserved antegrade flow within the left portal vein. Splenic vein
and superior mesenteric vein appear patent.

## 2021-10-03 IMAGING — US US ABDOMEN COMPLETE
1 series · 13 of 25 positions shown · non-contrast
Comparison: The CT with IV contrast [DATE].

CLINICAL DATA: Acute on chronic liver disease with jaundice,
earlier study today showing gallbladder thickening and positive
sonographic Murphy's sign.

EXAM:
ABDOMEN ULTRASOUND COMPLETE

[Series 1: us abdomen complete · 88 acquisitions, 13 frames shown]
[im 1/88]
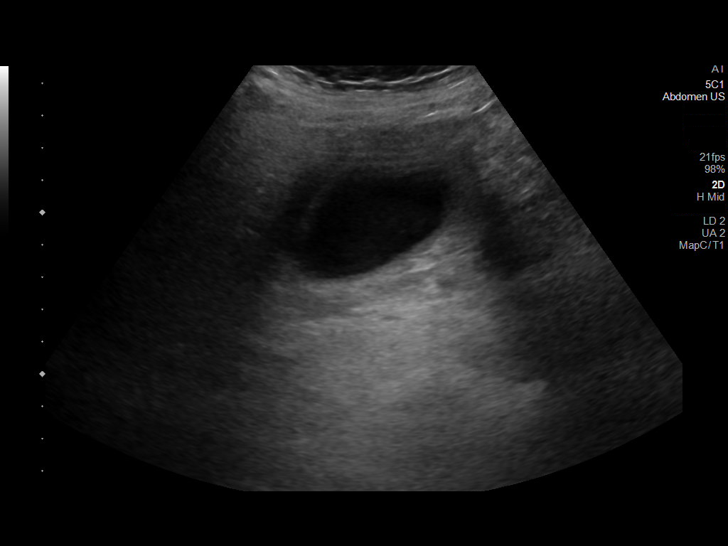
[im 8/88]
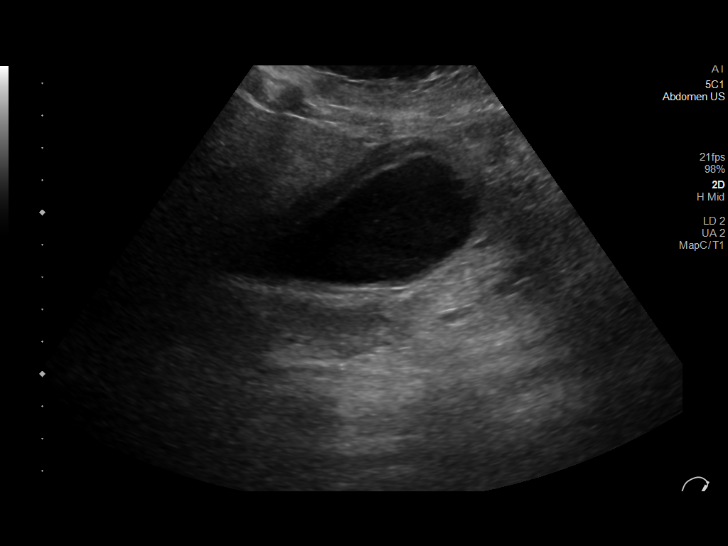
[im 15/88]
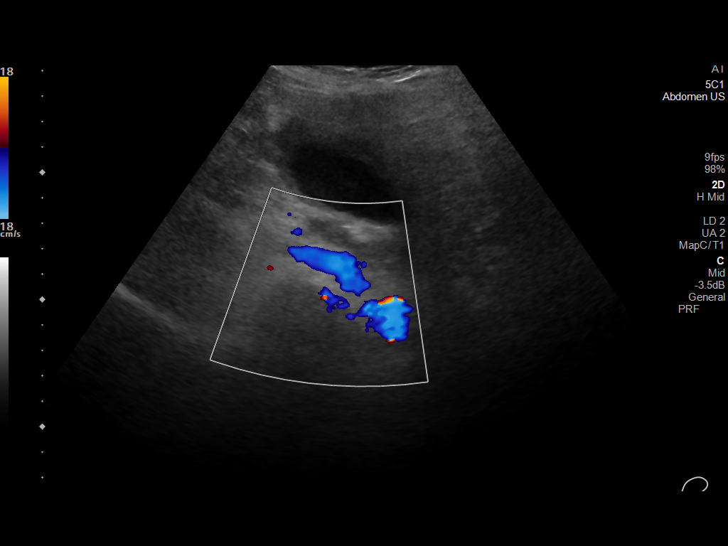
[im 22/88]
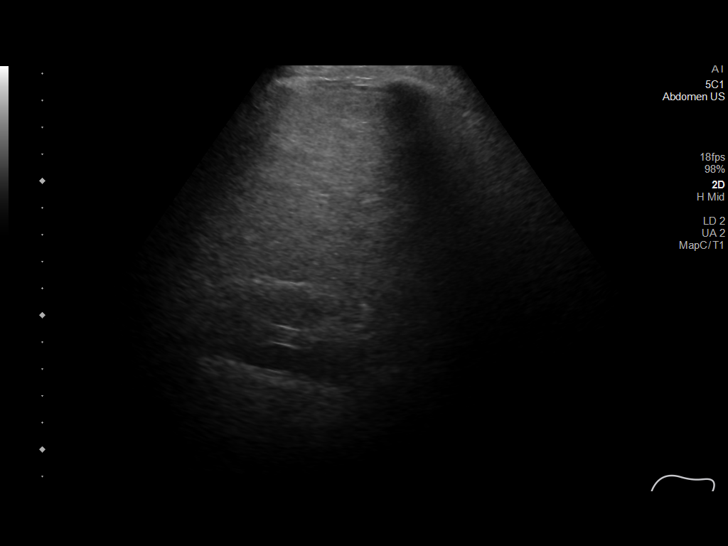
[im 30/88]
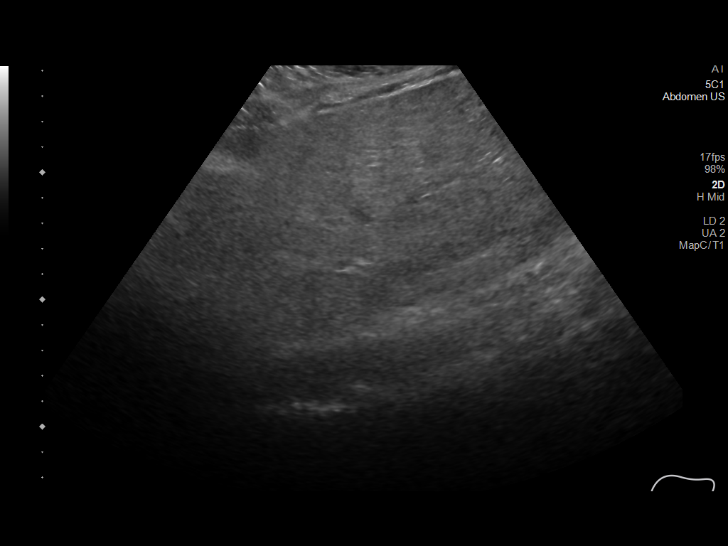
[im 37/88]
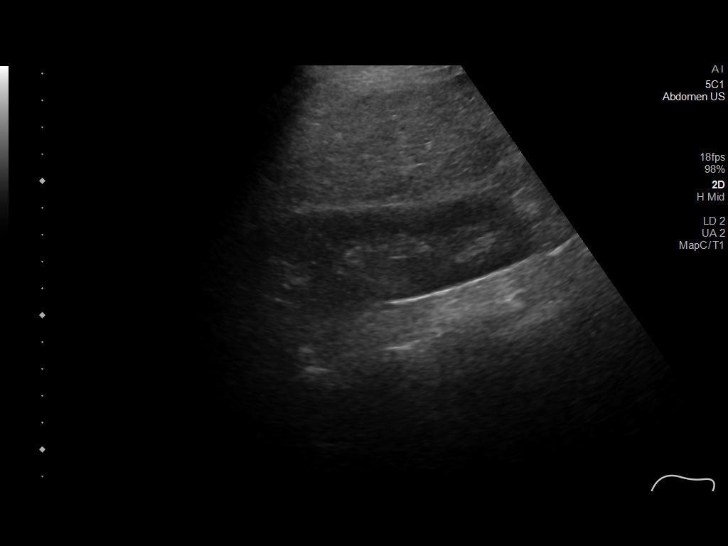
[im 44/88]
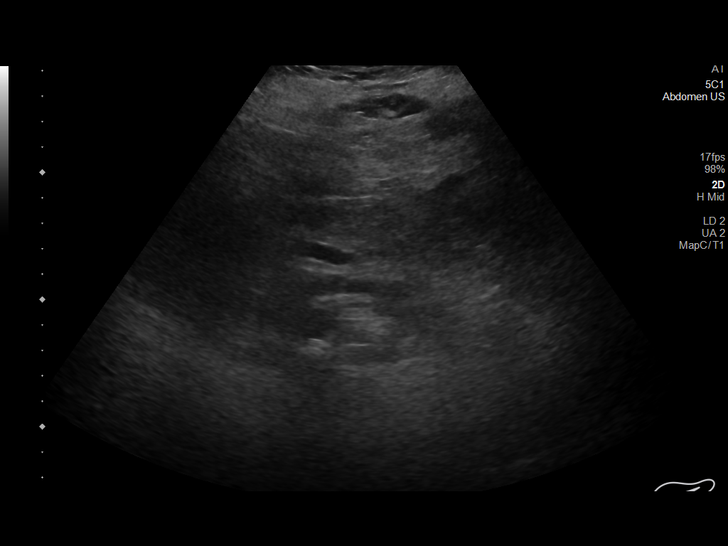
[im 51/88]
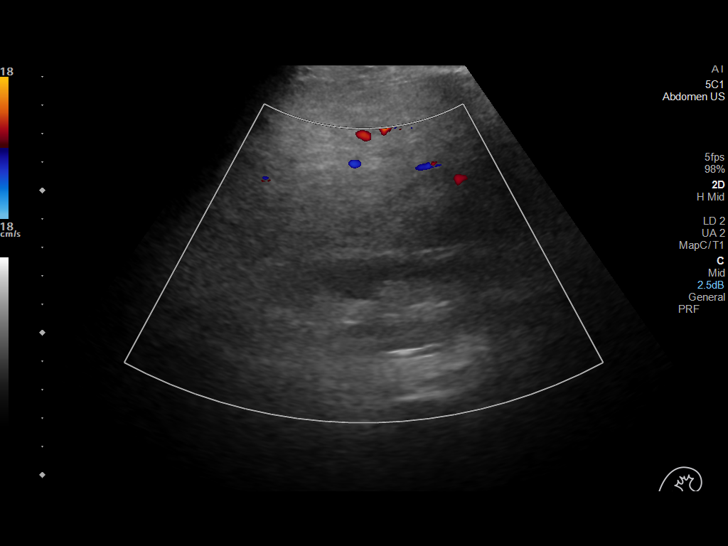
[im 59/88]
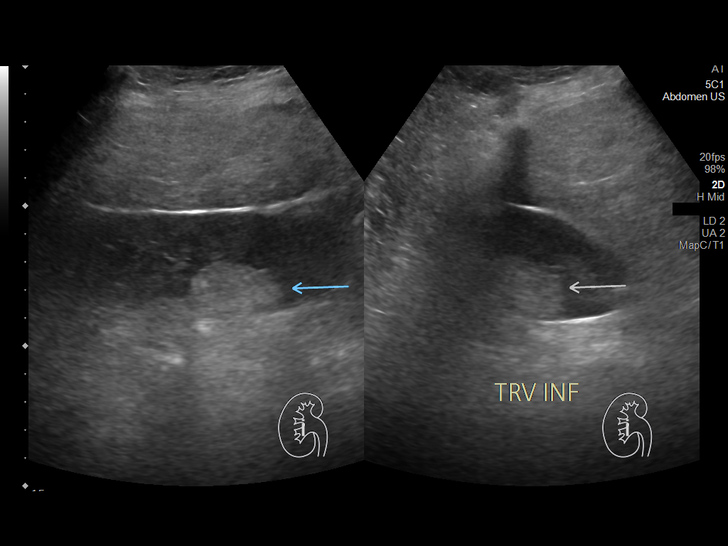
[im 66/88]
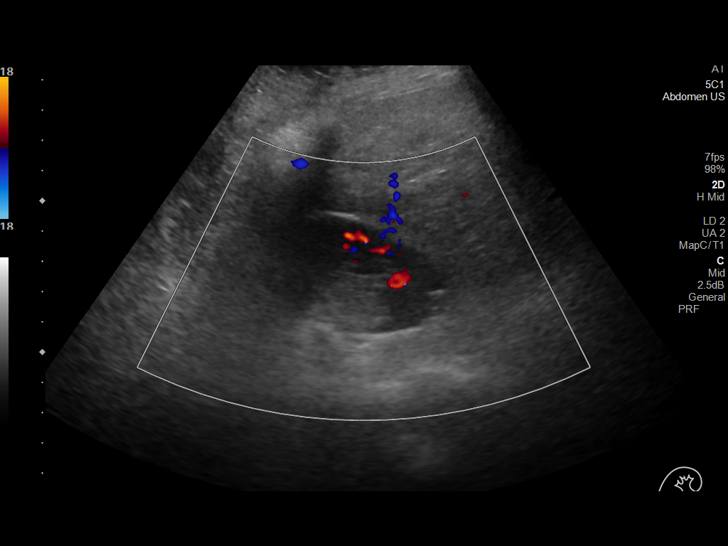
[im 73/88]
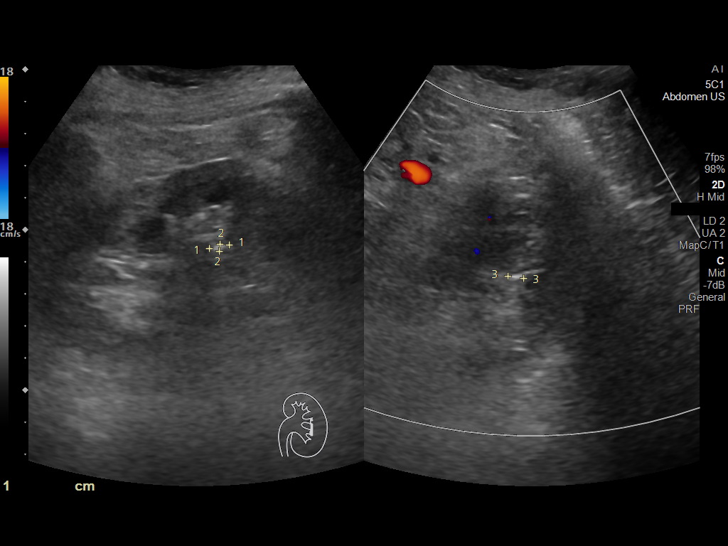
[im 80/88]
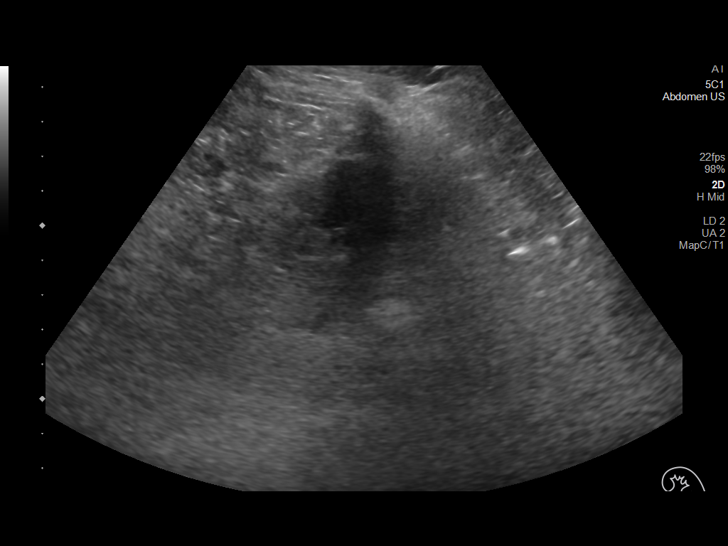
[im 88/88]
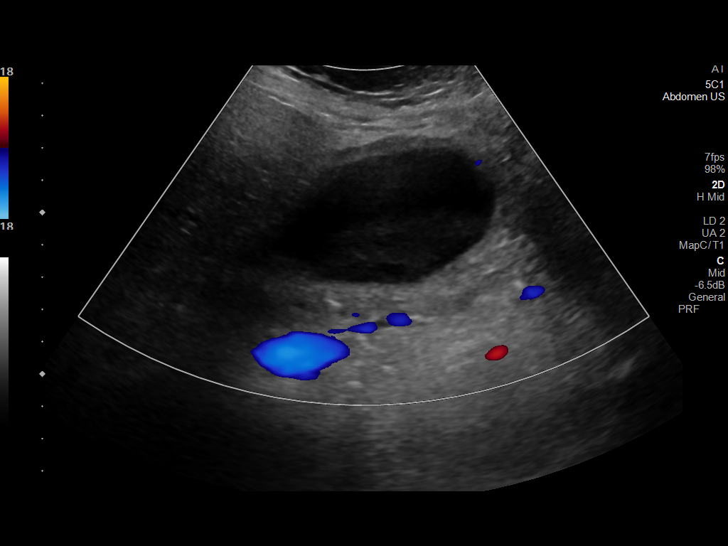

[13 of 25 positions shown; findings below may reference images not displayed]

FINDINGS: Gallbladder: There is hypoechoic layering sludge without visible
shadowing stones. There is trace pericholecystic fluid. The free
wall is thickened up to 8.1 mm and there is positive right upper
abdominal tenderness concerning for acute cholecystitis.

The wall thickening and positive right upper abdominal tenderness
could alternatively have a basis in hepatic dysfunction and/or
hepatitis or other adjacent inflammatory process. Wall thickening
was not seen on the [DATE] CT.

Common bile duct: Diameter: Prominent measuring 8.1 mm, but
previously measured 7 mm. No intrahepatic biliary dilatation.

Liver: No focal lesion identified through the patient's steatosis.
The liver is diffusely attenuating consistent with fatty
replacement, with moderate to severe fatty replacement noted on the
CT.

The liver length was not measured today but was previously 17 cm.
Portal vein is patent on color Doppler imaging with reversal of
flow. It is otherwise not well seen today but previously measured
prominent at 14 mm.

IVC: No abnormality visualized.

Pancreas: Body of the pancreas is visible and unremarkable. The
head, neck and tail segments are obscured by bowel gas.

Spleen: Size and appearance within normal limits.

Right Kidney: Length: 11.4 cm. Cortical echogenicity and thickness
within normal limits. No hydronephrosis or stone visualized. Again
noted is a 3.5 cm mass posteriorly with uniform increased
echogenicity consistent with the angiomyolipoma noted on the CT,
lower pole.

No other mass is seen. There was a 3 mm lower pole caliceal stone on
the CT but it is not visible sonographically , possibly obscured by
bowel gas shadowing or passed in the interval.

Left Kidney: Length: 9.5 cm. Echogenicity within normal limits. No
mass, stone or hydronephrosis visualized.

Abdominal aorta: No aneurysm visualized.

Other findings: No ascites.
IMPRESSION: 1. Gallbladder sludge without shadowing stones, with wall
thickening, pericholecystic fluid and positive sonographic Murphy
sign. Findings suspicious for cholecystitis. With hepatic
dysfunction, alternative etiology could be congestive or reactive
thickening such as with hepatitis, or adjacent inflammatory process.
2. The pancreas mostly obscured but normal where visible.
3. Prominent common bile duct.  No intrahepatic biliary prominence.
4. Diffuse liver attenuation consistent with the fatty replacement
noted on the CT.
[DATE] cm right renal angiomyolipoma.
6. Reversed flow in the hepatic portal vein. The portal vein was
otherwise not well enough visualized to measure its diameter but it
was previously slightly prominent.

## 2021-10-03 MED ORDER — POTASSIUM CHLORIDE CRYS ER 20 MEQ PO TBCR
40.0000 meq | EXTENDED_RELEASE_TABLET | Freq: Once | ORAL | Status: DC
  Filled 2021-10-03: qty 2

## 2021-10-03 MED ORDER — ADULT MULTIVITAMIN W/MINERALS CH
1.0000 | ORAL_TABLET | Freq: Every day | ORAL | Status: DC
  Administered 2021-10-03 – 2021-10-10 (×8): 1 via ORAL
  Filled 2021-10-03 (×8): qty 1

## 2021-10-03 MED ORDER — HYDROMORPHONE HCL 1 MG/ML IJ SOLN
0.5000 mg | Freq: Once | INTRAMUSCULAR | Status: AC
  Administered 2021-10-03: 0.5 mg via INTRAVENOUS
  Filled 2021-10-03: qty 1

## 2021-10-03 MED ORDER — HYDROMORPHONE HCL 1 MG/ML IJ SOLN
1.0000 mg | INTRAMUSCULAR | Status: DC | PRN
  Administered 2021-10-03 – 2021-10-05 (×6): 1 mg via INTRAVENOUS
  Filled 2021-10-03 (×6): qty 1

## 2021-10-03 MED ORDER — FOLIC ACID 1 MG PO TABS
1.0000 mg | ORAL_TABLET | Freq: Every day | ORAL | Status: DC
  Administered 2021-10-03 – 2021-10-10 (×8): 1 mg via ORAL
  Filled 2021-10-03 (×8): qty 1

## 2021-10-03 MED ORDER — LEVOTHYROXINE SODIUM 75 MCG PO TABS
75.0000 ug | ORAL_TABLET | Freq: Every day | ORAL | Status: DC
  Administered 2021-10-04 – 2021-10-10 (×7): 75 ug via ORAL
  Filled 2021-10-03 (×8): qty 1

## 2021-10-03 MED ORDER — LACTATED RINGERS IV BOLUS
1000.0000 mL | Freq: Once | INTRAVENOUS | Status: AC
  Administered 2021-10-03: 1000 mL via INTRAVENOUS

## 2021-10-03 MED ORDER — ORAL CARE MOUTH RINSE
15.0000 mL | Freq: Two times a day (BID) | OROMUCOSAL | Status: DC
  Administered 2021-10-04 – 2021-10-13 (×12): 15 mL via OROMUCOSAL

## 2021-10-03 MED ORDER — CHLORHEXIDINE GLUCONATE 0.12 % MT SOLN
15.0000 mL | Freq: Two times a day (BID) | OROMUCOSAL | Status: DC
  Administered 2021-10-04 – 2021-10-12 (×17): 15 mL via OROMUCOSAL
  Filled 2021-10-03 (×15): qty 15

## 2021-10-03 MED ORDER — POTASSIUM CHLORIDE 10 MEQ/100ML IV SOLN
10.0000 meq | INTRAVENOUS | Status: AC
  Administered 2021-10-03 (×4): 10 meq via INTRAVENOUS
  Filled 2021-10-03 (×4): qty 100

## 2021-10-03 MED ORDER — ONDANSETRON HCL 4 MG/2ML IJ SOLN: 4.0000 mg | Freq: Three times a day (TID) | INTRAMUSCULAR | Status: DC | PRN

## 2021-10-03 MED ORDER — THIAMINE HCL 100 MG PO TABS
100.0000 mg | ORAL_TABLET | Freq: Every day | ORAL | Status: DC
  Administered 2021-10-03 – 2021-10-10 (×7): 100 mg via ORAL
  Filled 2021-10-03 (×7): qty 1

## 2021-10-03 MED ORDER — THIAMINE HCL 100 MG/ML IJ SOLN
100.0000 mg | Freq: Every day | INTRAMUSCULAR | Status: DC
  Administered 2021-10-06: 100 mg via INTRAVENOUS
  Filled 2021-10-03: qty 2

## 2021-10-03 MED ORDER — VENLAFAXINE HCL ER 75 MG PO CP24: 75.0000 mg | ORAL_CAPSULE | Freq: Every day | ORAL | Status: DC

## 2021-10-03 MED ORDER — MORPHINE SULFATE (PF) 2 MG/ML IV SOLN
2.0000 mg | Freq: Once | INTRAVENOUS | Status: AC
  Administered 2021-10-03: 2 mg via INTRAVENOUS
  Filled 2021-10-03: qty 1

## 2021-10-03 MED ORDER — PANTOPRAZOLE SODIUM 40 MG IV SOLR
40.0000 mg | Freq: Every day | INTRAVENOUS | Status: DC
  Administered 2021-10-03 – 2021-10-04 (×2): 40 mg via INTRAVENOUS
  Filled 2021-10-03 (×2): qty 10

## 2021-10-03 MED ORDER — SODIUM CHLORIDE 0.9 % IV BOLUS
500.0000 mL | Freq: Once | INTRAVENOUS | Status: AC
  Administered 2021-10-03: 500 mL via INTRAVENOUS

## 2021-10-03 MED ORDER — LEVETIRACETAM 500 MG PO TABS
500.0000 mg | ORAL_TABLET | Freq: Two times a day (BID) | ORAL | Status: DC
  Administered 2021-10-03 – 2021-10-10 (×14): 500 mg via ORAL
  Filled 2021-10-03 (×15): qty 1

## 2021-10-03 MED ORDER — ENOXAPARIN SODIUM 40 MG/0.4ML IJ SOSY
40.0000 mg | PREFILLED_SYRINGE | INTRAMUSCULAR | Status: DC
  Administered 2021-10-03 – 2021-10-09 (×7): 40 mg via SUBCUTANEOUS
  Filled 2021-10-03 (×7): qty 0.4

## 2021-10-03 NOTE — ED Triage Notes (Addendum)
Pt reports R sided abd pain, nausea, vomiting, and generalized weakness x 1 month. Pt jaundice that she reports for 2-3 days.  Pt states she drinks "sips" of alcohol every day for pain control.

## 2021-10-03 NOTE — ED Provider Notes (Addendum)
[facility]PART[facility] Provider Note   CSN: [id number] Arrival date & time: 09/16/2021  1628     History  Chief Complaint  Patient presents with   Jaundice   Abdominal Pain    Judy Meyer is a 61 y.o. female with a past medical history of decreased liver function, hyperammonemia, malnutrition, hypothyroidism, hypertension, hyperlipidemia and a questionable alcohol use disorder presenting today with complaint of abdominal pain for a month.  She reports that over the last month she has been nauseous, vomiting and experiencing right upper quadrant pain.  Says that she is unable to hold any food or drink down.  When asked about her alcohol use she says she drinks "occasionally."  When I asked her to elaborate she would not give a specific amount but stated that it was "every now and again."  Denies any melena or hematochezia.  No shortness of breath, chest pain or palpitations.  No urinary symptoms.  She used to follow with gastroenterology but she does not anymore because she feels like nobody is helping her.   Home Medications Prior to Admission medications   Medication Sig Start Date End Date Taking? Authorizing Provider  albuterol (VENTOLIN HFA) 108 (90 Base) MCG/ACT inhaler Inhale 2 puffs into the lungs 4 (four) times daily as needed for wheezing or shortness of breath. 12/20/19   [provider]  amLODipine (NORVASC) 10 MG tablet Take 1 tablet (10 mg total) by mouth daily. 11/21/20   Mercy Riding, MD  aspirin-acetaminophen-caffeine (EXCEDRIN MIGRAINE) [phone] MG tablet Take 1 tablet by mouth every 6 (six) hours as needed for headache.    [provider]  Galcanezumab-gnlm (EMGALITY) 120 MG/ML SOAJ Inject 120 mg into the skin every 30 (thirty) days.    [provider]  levETIRAcetam (KEPPRA) 500 MG tablet Take 1 tablet (500 mg total) by mouth 2 (two) times daily. 03/20/21   Hosie Poisson, MD  levothyroxine (SYNTHROID) 75 MCG tablet  Take 1 tablet (75 mcg total) by mouth daily at 6 (six) AM. 03/21/21   Hosie Poisson, MD  norethindrone-ethinyl estradiol (NORTREL 7/7/7) 0.5/0.75/1-35 MG-MCG tablet Take 1 tablet by mouth daily.    [provider]  omeprazole (PRILOSEC) 20 MG capsule Take 20 mg by mouth daily.    [provider]  ondansetron (ZOFRAN) 4 MG tablet Take 1 tablet (4 mg total) by mouth daily as needed for nausea or vomiting. 11/20/20 11/20/21  Mercy Riding, MD  ondansetron (ZOFRAN) 4 MG tablet Take 1 tablet (4 mg total) by mouth every 6 (six) hours. 07/25/21   Valarie Merino, MD  potassium chloride SA (KLOR-CON) 20 MEQ tablet Take 1 tablet (20 mEq total) by mouth daily. 03/20/21   Hosie Poisson, MD  venlafaxine XR (EFFEXOR-XR) 75 MG 24 hr capsule Take 1 capsule (75 mg total) by mouth daily. 11/20/20   Mercy Riding, MD      Allergies    Compazine [prochlorperazine edisylate], Haloperidol, Periactin [cyproheptadine], Cyclobenzaprine, Haloperidol and related, and Metoclopramide    Review of Systems   Review of Systems  Constitutional:  Positive for fatigue.  Gastrointestinal:  Positive for abdominal pain, nausea and vomiting. Negative for blood in stool.  Neurological:  Positive for weakness.  See HPI Physical Exam Updated Vital Signs BP 112/66    Pulse 92    Temp (!) 97.5 F (36.4 C) (Oral)    Resp 18    LMP 06/11/2018    SpO2 99%  Physical Exam Vitals and  nursing note reviewed.  Constitutional:      Appearance: Normal appearance. She is ill-appearing.     Comments: Chronically ill-appearing.  Extremely jaundiced and cachectic  HENT:     Head: Normocephalic and atraumatic.  Eyes:     General: Scleral icterus present.     Conjunctiva/sclera: Conjunctivae normal.  Cardiovascular:     Rate and Rhythm: Normal rate and regular rhythm.  Pulmonary:     Effort: Pulmonary effort is normal. No respiratory distress.  Abdominal:     General: Abdomen is flat.     Palpations: Abdomen is rigid. There  is hepatomegaly.  Skin:    General: Skin is warm and dry.     Coloration: Skin is jaundiced.     Findings: No rash.  Neurological:     Mental Status: She is alert.  Psychiatric:        Mood and Affect: Mood normal.    ED Results / Procedures / Treatments   Labs (all labs ordered are listed, but only abnormal results are displayed) Labs Reviewed  LIPASE, BLOOD - Abnormal; Notable for the following components:      Result Value   Lipase 93 (*)    All other components within normal limits  COMPREHENSIVE METABOLIC PANEL - Abnormal; Notable for the following components:   Sodium 132 (*)    Potassium 2.4 (*)    Chloride 95 (*)    CO2 17 (*)    Glucose, Bld 164 (*)    Creatinine, Ser 1.05 (*)    Albumin 2.7 (*)    AST 317 (*)    ALT 80 (*)    Alkaline Phosphatase 175 (*)    Total Bilirubin 33.5 (*)    Anion gap 20 (*)    All other components within normal limits  CBC - Abnormal; Notable for the following components:   WBC 17.2 (*)    RBC 3.25 (*)    Hemoglobin 10.8 (*)    HCT 31.6 (*)    RDW 32.2 (*)    nRBC 0.7 (*)    All other components within normal limits  ETHANOL - Abnormal; Notable for the following components:   Alcohol, Ethyl (B) 216 (*)    All other components within normal limits  AMMONIA - Abnormal; Notable for the following components:   Ammonia 106 (*)    All other components within normal limits  LACTIC ACID, PLASMA - Abnormal; Notable for the following components:   Lactic Acid, Venous >9.0 (*)    All other components within normal limits  RESP PANEL BY RT-PCR (FLU A&B, COVID) ARPGX2  PHOSPHORUS  MAGNESIUM  URINALYSIS, ROUTINE W REFLEX MICROSCOPIC  PROTIME-INR  LACTIC ACID, PLASMA  I-STAT BETA HCG BLOOD, ED ([facility], [facility], [facility] ONLY)    EKG None  Radiology US Abdomen Limited RUQ (LIVER/GB)  Result Date: 09/22/2021 CLINICAL DATA:  RIGHT-sided abdominal pain.  Jaundice EXAM: ULTRASOUND ABDOMEN LIMITED RIGHT UPPER QUADRANT COMPARISON:  CT 07/25/2021,  ultrasound 07/24/2021 FINDINGS: Gallbladder: Sludge within the gallbladder. The gallbladder wall is thickened. Positive sonographic Murphy's sign. Common bile duct: Diameter: Normal at 4 mm Liver: Increased echogenicity. Portal vein is patent on color Doppler imaging with normal direction of blood flow towards the liver. Other: None. IMPRESSION: Pericholecystic fluid, gallbladder wall thickening and positive sonographic Murphy's sign are concerning for acute cholecystitis. Electronically Signed   By: Suzy Bouchard M.D.   On: 09/09/2021 18:07    Procedures .Critical Care Performed by: Rhae Hammock, PA-C Authorized by: Mervyn Gay  A, PA-C   Critical care provider statement:    Critical care time (minutes):  30   Critical care time was exclusive of:  Separately billable procedures and treating other patients   Critical care was necessary to treat or prevent imminent or life-threatening deterioration of the following conditions:  Hepatic failure   Critical care was time spent personally by me on the following activities:  Development of treatment plan with patient or surrogate, discussions with consultants, evaluation of patient's response to treatment, examination of patient, ordering and review of laboratory studies, ordering and review of radiographic studies, ordering and performing treatments and interventions, pulse oximetry, re-evaluation of patient's condition and review of old charts   I assumed direction of critical care for this patient from another provider in my specialty: no     Care discussed with: admitting provider     Medications Ordered in ED Medications  potassium chloride 10 mEq in 100 mL IVPB (10 mEq Intravenous New Bag/Given 09/21/2021 2023)  HYDROmorphone (DILAUDID) injection 1 mg (has no administration in time range)  sodium chloride 0.9 % bolus 500 mL (0 mLs Intravenous Stopped 09/12/2021 1814)  morphine (PF) 2 MG/ML injection 2 mg (2 mg Intravenous Given 09/17/2021  1712)  HYDROmorphone (DILAUDID) injection 0.5 mg (0.5 mg Intravenous Given 09/10/2021 1927)    ED Course/ Medical Decision Making/ A&P                           Medical Decision Making Amount and/or Complexity of Data Reviewed Labs: ordered. Radiology: ordered.  Risk Prescription drug management. Decision regarding hospitalization.   61 year old female with an extensive past medical history of liver disease presenting today with abdominal pain, nausea and vomiting.  She says that this has been going on for months however she has not followed up.  Differential includes but is not limited to hepatitis, liver failure, cholecystitis, cholelithiasis, choledocholithiasis, appendicitis, nephrolithiasis, pyelonephritis and malignancy.  Chart review: I performed an extensive internal nternal and external chart review.  In 07/2020, patient was found to have abnormal LFTs and the etiology.  She subsequently began to see GI to find a cause of this.  She has had working diagnoses of hepatic steatosis versus drug-induced liver injury.  MRCP was performed 07/2020 with the following read:   IMPRESSION: 1. No acute findings to explain pain. Minimal layering sludge in the gallbladder without evidence of discrete gallstones or other obstructing lesions. No biliary ductal dilatation. 2. Hepatomegaly and hepatic steatosis.  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   Throughout all of her visits she seemed to give varying reports of how much alcohol she drinks and how frequently.  She continued to follow with GI her last gastroenterology appointment seems to have been 11/2020.  Patient was supposed to follow-up for liver biopsies but did not do so.  She has had numerous visits to varying providers for hypokalemia, elevated LFTs, hyperammonemia, intractable nausea vomiting and abdominal pain.  Workup:  Lab  Tests:  I Ordered and viewed labs.   The pertinent results include:  Tbilli: 33.5 AST 317 ALT 80 K 2.4 Monia 106 Lactic greater than 9 Ethanol 216-denies drinking today.  2. Imaging Studies ordered:  I ordered and individually viewed patient's right upper quadrant ultrasound.  Radiologist had a concern for cholecystitis however in the setting of patient's acute liver failure, fluid more likely ascitic  in nature.  3. Consultations Obtained:  I secure chatted with Dr. Alessandra Bevels with GI who will see the patient through her hospital admission.  I also spoke with Dr. Dema Severin with general surgery who did not believe the patient required any surgical intervention.   Treatment:  Medications:  I ordered morphine for pain and on reevaluation patient reported feeling the same.  Dilaudid ordered.  She has been given IV potassium for her hypokalemia.  Dispo:  Problem List / ED Course:  Acute liver failure, hypokalemia, hyperammonemia social determinants of health include:    Dispostion:  After the interventions noted above, I believe it is necessary to admit the patient to the hospital for her liver failure.  They will also be able to manage her severe hypokalemia.  She and her boyfriend are agreeable to this plan.  All questions were answered and IM to admit the patient.   Final Clinical Impression(s) / ED Diagnoses Final diagnoses:  Abdominal pain  Subacute liver failure without hepatic coma  Hypokalemia    Rx / DC Orders Admit to IM   Larkin Alfred, [place], PA-C 09/26/2021 1915    Rhae Hammock, PA-C 09/26/2021 2112    Gareth Morgan, MD 10/04/21 1017

## 2021-10-03 NOTE — ED Notes (Signed)
Pt reports last drink was yesterday. She normally drinks vodka.

## 2021-10-03 NOTE — H&P (Addendum)
Date: 09/11/2021               Patient Name:  Judy Meyer MRN: [id number]  DOB: 1960/12/24 Age / Sex: 61 y.o., female   PCP: Pcp, No         Medical Service: Internal Medicine Teaching Service         Attending Physician: Dr. Jimmye Norman, Elaina Pattee, MD    First Contact: Dr. Marlou Sa Pager: [phone]  Second Contact: Dr. Johnney Ou Pager: [phone]       After Hours (After 5p/  First Contact Pager: [phone]  weekends / holidays): Second Contact Pager: [phone]   Chief Complaint: abdominal pain  History of Present Illness: Ms. Judy Rosch. Meyer is a 61 year old female hypothyroidism, hypertension, seizures, anemia, hyperlipidemia, alcohol use disorder, alcohol associated hepatitis followed by [facility] hepatology presenting with abdominal pain and jaundice. Patient reports feeling poorly for the past few months since she went to [place] long.  States that her abdominal pain, nausea and vomiting has progressed since that time.  She describes right upper quadrant sharp abdominal pain. She endorses heat intolerance, headaches, lightheadedness, dizziness, palpitations, weakness and 1 episode of diarrhea.  She states that she has been unable to tolerate oral intake nor take her medications for the past several weeks due to severe nausea and vomiting.  She has continued to drink alcohol daily, endorses a few small sips of vodka every day.  States that she has been drinking less due to the nausea and vomiting.  Denies any history of alcohol withdrawal.  States that she has been urinating less than usual which she attributes to eating and drinking less.  She denies any chest pain, shortness of breath, recent changes to her medications, recent sick contacts or illnesses.  States that she has not seen her hepatologist recently.  Meds:  Amlodipine 10 mg Keppra 500 mg twice daily Levothyroxine 75 mcg daily Venlafaxine 75 mg daily Potassium chloride 20 mill equivalents daily Zofran 4 mg daily Albuterol  inhaler as needed Omeprazole 20 mg daily   Allergies: Allergies as of 09/18/2021 - Review Complete 09/23/2021  Allergen Reaction Noted   Compazine [prochlorperazine edisylate] Anaphylaxis 07/11/2018   Haloperidol Other (See Comments) 10/27/2020   Periactin [cyproheptadine] Anaphylaxis 07/11/2018   Cyclobenzaprine Other (See Comments) 07/11/2018   Haloperidol and related Other (See Comments) 07/11/2018   Metoclopramide Other (See Comments) 07/11/2018   Past Medical History:  Diagnosis Date   Hypertension    Hypothyroidism    Liver disease    Uterine fibroid     Family History:  Family History  Problem Relation Age of Onset   Hypertension Other    Hypertension Mother    High Cholesterol Mother     Social History: Patient lives at home with her boyfriend.  Denies any tobacco or illicit drug use.  Endorses daily liquor use, last drink was yesterday.  States recently she has been only drinking a few sips a day due to her nausea.  Denies heavy alcohol use or history of alcohol withdrawal.  Review of Systems: A complete ROS was negative except as per HPI.   Physical Exam: Blood pressure 136/66, pulse 82, temperature (!) 97.5 F (36.4 C), temperature source Oral, resp. rate 17, last menstrual period 06/11/2018, SpO2 96 %. Physical Exam General: alert, appears stated age, ill appearing, severely jaundiced HEENT: Normocephalic, atraumatic, EOM intact, scleral icterus CV: Regular rate and rhythm, no murmurs rubs or gallops Pulm: Clear to auscultation bilaterally, normal work of  breathing Abdomen: Soft, nondistended, bowel sounds present, RUQ and epigastric tenderness to palpation MSK: No lower extremity edema Skin: Warm and dry, severe jaundice  Neuro: Alert and oriented x3   EKG: personally reviewed my interpretation is NSR  CXR: personally reviewed my interpretation is n/a  Right upper quadrant abdominal ultrasound IMPRESSION: Pericholecystic fluid, gallbladder wall  thickening and positive sonographic Murphy's sign are concerning for acute cholecystitis  Assessment & Plan by Problem: Principal Problem:   Alcoholic hepatitis Active Problems:   Hypokalemia   Hyperbilirubinemia   Acidosis, metabolic   Leukocytosis   Lactic acidosis  Alcoholic hepatitis Alcohol use disorder Patient presented with abdominal pain, jaundice, transaminitis and elevated PT/INR (AST 306, ALT 80, alk phos 176, T. bili 33.5, PT 18, INR 1.6).  She endorses daily alcohol use, last drink was yesterday.Ultrasound shows pericholecystic fluid and gallbladder wall thickening which can be seen in the setting of liver failure; general surgery was consulted but felt there was no indication for cholecystectomy and this is in the setting of liver disease.  Of note previous HIDA scan showed biliary dyskinesia but no cholecystitis.  Patient was diagnosed with alcohol hepatitis in 2021.  An extensive work-up at that time was negative for viral hepatitis, CMV, EBV, ANA, AMA, ASMA, IgG4, celiac disease and autoimmune hepatitis.  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  MRCP showed hepatomegaly.  Liver biopsy on 07/22/2020 showed macrovesicular steatosis, cholestasis with ductal injury and ductular reaction, F1 fibrosis and no significant iron deposition.  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  She was treated with corticosteroids and ursodiol.  She was lost to follow-up for several months and then presented in April 2022 with hepatic encephalopathy and intractable nausea and vomiting.  Labs showed progression of liver injury.  MRCP and ultrasound showed fatty liver and concern for portal hypertension.  Plan was for repeat liver biopsy however PTEH test was highly positive suggesting excessive alcohol use prior to admission.  Unfortunately patient has continued to drink alcohol daily, presentation is consistent with  alcoholic hepatitis.  Hepatic discriminant function 49.6. Ethanol level 216. No signs of hepatic encephalopathy.   -GI on board, appreciate recommendations -Follow-up hepatitis panel -Follow-up complete ultrasound and liver Doppler -Trend CMP -Trend PT-INR -Encouraged alcohol cessation -CIWA without Ativan  Lactic acidosis  Anion gap metabolic acidosis Likely in the setting of alcoholic hepatitis.  Lactic acid greater than 9.  Anion gap 20.  No sign of infectious etiology at this point.  -Follow-up UA, urine and blood cultures -IV fluids -Trend BMP and lactic acid  Hypokalemia K 2.5.  Patient has chronic hypokalemia and on home potassium supplementation which she has not been taking. -Trend BMP -Replete  Hypoalbuminemia  Albumin 2.7.  Likely in the setting of alcohol use disorder.  Consult nutrition.  Hypertension Normotensive on admission.  Continue home medications include amlodipine 10 mg daily.  Seizure disorder On Keppra 500 mg twice daily.  Hypothyroidism Home medications include levothyroxine 75 mcg daily.  Patient states that she has not taken this medication for several weeks.  Will check a TSH. -Follow-up TSH -Continue levothyroxine 75 mcg  Migraines On Galcanezumab, monoclonal antibody monthly injections.  Depression Home medication includes venlafaxine 75 mg however patient has not taken this medication for several weeks. Consider restarting at a lower dose.   Diet: Clear liquid diet VTE prophylaxis: Lovenox Full code  Dispo: Admit patient to Inpatient with expected length of stay greater than 2  midnights.  SignedMike Craze, DO 09/10/2021, 9:29 PM  Pager: [phone] After 5pm on weekdays and 1pm on weekends: On Call pager: [phone]

## 2021-10-03 NOTE — Progress Notes (Signed)
Pt admitted to 5w19 from ED. Pt lives at home with boyfriend. Pt is A&Ox4. Pt is very jaundiced and scleras are yellow. Pt's buttocks/groin is very red and open, bleeding slightly. Pt states she has been wiping a lot and made it break down. Applied moisture barrier ointment to buttocks/groin area. Bruising noted to right arm. Pt placed on high fall precautions. Bed alarm on. Showed patient how to use call bell and told patient to call for assistance before getting out of bed or if she needed help. Pt verbalized understanding. Will continue to monitor pt. Racheal Patches RN

## 2021-10-03 NOTE — ED Notes (Signed)
Patient transported to Ultrasound 

## 2021-10-04 DIAGNOSIS — E872 Acidosis, unspecified: Secondary | ICD-10-CM | POA: Diagnosis not present

## 2021-10-04 DIAGNOSIS — D689 Coagulation defect, unspecified: Secondary | ICD-10-CM

## 2021-10-04 DIAGNOSIS — K7010 Alcoholic hepatitis without ascites: Secondary | ICD-10-CM

## 2021-10-04 DIAGNOSIS — K7682 Hepatic encephalopathy: Secondary | ICD-10-CM | POA: Diagnosis not present

## 2021-10-04 LAB — BASIC METABOLIC PANEL
Anion gap: 17 — ABNORMAL HIGH (ref 5–15)
Anion gap: 15 (ref 5–15)
Anion gap: 17 — ABNORMAL HIGH (ref 5–15)
Anion gap: 15 (ref 5–15)
BUN: 10 mg/dL (ref 6–20)
BUN: 10 mg/dL (ref 6–20)
BUN: 11 mg/dL (ref 6–20)
BUN: 11 mg/dL (ref 6–20)
CO2: 17 mmol/L — ABNORMAL LOW (ref 22–32)
CO2: 16 mmol/L — ABNORMAL LOW (ref 22–32)
CO2: 14 mmol/L — ABNORMAL LOW (ref 22–32)
CO2: 16 mmol/L — ABNORMAL LOW (ref 22–32)
Calcium: 8.3 mg/dL — ABNORMAL LOW (ref 8.9–10.3)
Calcium: 7.9 mg/dL — ABNORMAL LOW (ref 8.9–10.3)
Calcium: 7.9 mg/dL — ABNORMAL LOW (ref 8.9–10.3)
Calcium: 7.7 mg/dL — ABNORMAL LOW (ref 8.9–10.3)
Chloride: 98 mmol/L (ref 98–111)
Chloride: 100 mmol/L (ref 98–111)
Chloride: 101 mmol/L (ref 98–111)
Chloride: 99 mmol/L (ref 98–111)
Creatinine, Ser: 1.14 mg/dL — ABNORMAL HIGH (ref 0.44–1.00)
Creatinine, Ser: 1.12 mg/dL — ABNORMAL HIGH (ref 0.44–1.00)
Creatinine, Ser: 0.96 mg/dL (ref 0.44–1.00)
Creatinine, Ser: 0.96 mg/dL (ref 0.44–1.00)
GFR, Estimated: 55 mL/min — ABNORMAL LOW (ref >60)
GFR, Estimated: 57 mL/min — ABNORMAL LOW (ref >60)
GFR, Estimated: >60 mL/min (ref >60)
GFR, Estimated: >60 mL/min (ref >60)
Glucose, Bld: 105 mg/dL — ABNORMAL HIGH (ref 70–99)
Glucose, Bld: 132 mg/dL — ABNORMAL HIGH (ref 70–99)
Glucose, Bld: 182 mg/dL — ABNORMAL HIGH (ref 70–99)
Glucose, Bld: 242 mg/dL — ABNORMAL HIGH (ref 70–99)
Potassium: 2.5 mmol/L — CL (ref 3.5–5.1)
Potassium: 2.9 mmol/L — ABNORMAL LOW (ref 3.5–5.1)
Potassium: 4.4 mmol/L (ref 3.5–5.1)
Potassium: 3.7 mmol/L (ref 3.5–5.1)
Sodium: 132 mmol/L — ABNORMAL LOW (ref 135–145)
Sodium: 131 mmol/L — ABNORMAL LOW (ref 135–145)
Sodium: 132 mmol/L — ABNORMAL LOW (ref 135–145)
Sodium: 130 mmol/L — ABNORMAL LOW (ref 135–145)

## 2021-10-04 LAB — HEPATITIS PANEL, ACUTE
HCV Ab: NONREACTIVE
Hep A IgM: NONREACTIVE
Hep B C IgM: NONREACTIVE
Hepatitis B Surface Ag: NONREACTIVE

## 2021-10-04 LAB — COMPREHENSIVE METABOLIC PANEL
ALT: 69 U/L — ABNORMAL HIGH (ref 0–44)
AST: 301 U/L — ABNORMAL HIGH (ref 15–41)
Albumin: 2.3 g/dL — ABNORMAL LOW (ref 3.5–5.0)
Alkaline Phosphatase: 151 U/L — ABNORMAL HIGH (ref 38–126)
Anion gap: 21 — ABNORMAL HIGH (ref 5–15)
BUN: 10 mg/dL (ref 6–20)
CO2: 10 mmol/L — ABNORMAL LOW (ref 22–32)
Calcium: 8.1 mg/dL — ABNORMAL LOW (ref 8.9–10.3)
Chloride: 103 mmol/L (ref 98–111)
Creatinine, Ser: 0.92 mg/dL (ref 0.44–1.00)
GFR, Estimated: >60 mL/min (ref >60)
Glucose, Bld: 106 mg/dL — ABNORMAL HIGH (ref 70–99)
Potassium: 3.5 mmol/L (ref 3.5–5.1)
Sodium: 134 mmol/L — ABNORMAL LOW (ref 135–145)
Total Bilirubin: 30.9 mg/dL (ref 0.3–1.2)
Total Protein: 6.2 g/dL — ABNORMAL LOW (ref 6.5–8.1)

## 2021-10-04 LAB — URINALYSIS, ROUTINE W REFLEX MICROSCOPIC
Glucose, UA: NEGATIVE mg/dL
Ketones, ur: NEGATIVE mg/dL
Nitrite: NEGATIVE
Protein, ur: 30 mg/dL — AB
Specific Gravity, Urine: 1.012 (ref 1.005–1.030)
pH: 5 (ref 5.0–8.0)

## 2021-10-04 LAB — TSH: TSH: 5.612 u[IU]/mL — ABNORMAL HIGH (ref 0.350–4.500)

## 2021-10-04 LAB — MAGNESIUM
Magnesium: 2.1 mg/dL (ref 1.7–2.4)
Magnesium: 1.8 mg/dL (ref 1.7–2.4)

## 2021-10-04 LAB — CBC
HCT: 27.3 % — ABNORMAL LOW (ref 36.0–46.0)
Hemoglobin: 9.2 g/dL — ABNORMAL LOW (ref 12.0–15.0)
MCH: 33.3 pg (ref 26.0–34.0)
MCHC: 33.7 g/dL (ref 30.0–36.0)
MCV: 98.9 fL (ref 80.0–100.0)
Platelets: 172 10*3/uL (ref 150–400)
RBC: 2.76 MIL/uL — ABNORMAL LOW (ref 3.87–5.11)
RDW: 32.5 % — ABNORMAL HIGH (ref 11.5–15.5)
WBC: 16.2 10*3/uL — ABNORMAL HIGH (ref 4.0–10.5)
nRBC: 0.8 % — ABNORMAL HIGH (ref 0.0–0.2)

## 2021-10-04 LAB — PROTIME-INR
INR: 1.7 — ABNORMAL HIGH (ref 0.8–1.2)
Prothrombin Time: 20.3 seconds — ABNORMAL HIGH (ref 11.4–15.2)

## 2021-10-04 LAB — GLUCOSE, CAPILLARY
Glucose-Capillary: 230 mg/dL — ABNORMAL HIGH (ref 70–99)
Glucose-Capillary: 204 mg/dL — ABNORMAL HIGH (ref 70–99)

## 2021-10-04 LAB — LACTIC ACID, PLASMA
Lactic Acid, Venous: 8.3 mmol/L (ref 0.5–1.9)
Lactic Acid, Venous: >9 mmol/L (ref 0.5–1.9)
Lactic Acid, Venous: 8.7 mmol/L (ref 0.5–1.9)
Lactic Acid, Venous: 8.4 mmol/L (ref 0.5–1.9)

## 2021-10-04 LAB — HIV ANTIBODY (ROUTINE TESTING W REFLEX): HIV Screen 4th Generation wRfx: NONREACTIVE

## 2021-10-04 MED ORDER — VITAMIN K1 10 MG/ML IJ SOLN: 10.0000 mg | Freq: Once | INTRAMUSCULAR | Status: DC

## 2021-10-04 MED ORDER — LACTATED RINGERS IV SOLN: INTRAVENOUS | Status: DC

## 2021-10-04 MED ORDER — POTASSIUM CHLORIDE CRYS ER 20 MEQ PO TBCR
40.0000 meq | EXTENDED_RELEASE_TABLET | Freq: Once | ORAL | Status: AC
  Administered 2021-10-04: 40 meq via ORAL
  Filled 2021-10-04: qty 2

## 2021-10-04 MED ORDER — DEXTROSE 5 % IV SOLN
INTRAVENOUS | Status: DC
Start: 2021-10-04 — End: 2021-10-05
  Filled 2021-10-04 (×2): qty 1000

## 2021-10-04 MED ORDER — INSULIN ASPART 100 UNIT/ML IJ SOLN: 0.0000 [IU] | INTRAMUSCULAR | Status: DC

## 2021-10-04 MED ORDER — LACTATED RINGERS IV BOLUS
1000.0000 mL | Freq: Once | INTRAVENOUS | Status: AC
  Administered 2021-10-04: 1000 mL via INTRAVENOUS

## 2021-10-04 MED ORDER — ONDANSETRON HCL 4 MG/2ML IJ SOLN
4.0000 mg | Freq: Four times a day (QID) | INTRAMUSCULAR | Status: DC | PRN
  Administered 2021-10-04 (×2): 4 mg via INTRAVENOUS
  Filled 2021-10-04 (×2): qty 2

## 2021-10-04 MED ORDER — POTASSIUM CHLORIDE 10 MEQ/100ML IV SOLN
10.0000 meq | INTRAVENOUS | Status: AC
  Administered 2021-10-04 (×4): 10 meq via INTRAVENOUS
  Filled 2021-10-04 (×4): qty 100

## 2021-10-04 MED ORDER — BOOST / RESOURCE BREEZE PO LIQD CUSTOM
1.0000 | Freq: Three times a day (TID) | ORAL | Status: DC
  Administered 2021-10-04 – 2021-10-08 (×10): 1 via ORAL
  Filled 2021-10-04: qty 1

## 2021-10-04 MED ORDER — PIPERACILLIN-TAZOBACTAM 3.375 G IVPB
3.3750 g | Freq: Three times a day (TID) | INTRAVENOUS | Status: AC
  Administered 2021-10-04 – 2021-10-11 (×21): 3.375 g via INTRAVENOUS
  Filled 2021-10-04 (×23): qty 50

## 2021-10-04 MED ORDER — PREDNISOLONE 5 MG PO TABS
40.0000 mg | ORAL_TABLET | Freq: Every day | ORAL | Status: DC
  Administered 2021-10-04 – 2021-10-09 (×6): 40 mg via ORAL
  Filled 2021-10-04 (×6): qty 8

## 2021-10-04 MED ORDER — LACTULOSE 10 GM/15ML PO SOLN
20.0000 g | Freq: Three times a day (TID) | ORAL | Status: DC
  Administered 2021-10-04 – 2021-10-07 (×9): 20 g via ORAL
  Filled 2021-10-04 (×9): qty 30

## 2021-10-04 MED ORDER — PHYTONADIONE 5 MG PO TABS
10.0000 mg | ORAL_TABLET | Freq: Once | ORAL | Status: AC
  Administered 2021-10-04: 10 mg via ORAL
  Filled 2021-10-04: qty 2

## 2021-10-04 MED ORDER — SODIUM CHLORIDE 0.9 % IV SOLN
3.3750 g | Freq: Three times a day (TID) | INTRAVENOUS | Status: DC
  Filled 2021-10-04 (×2): qty 15

## 2021-10-04 MED ORDER — RIFAXIMIN 550 MG PO TABS
550.0000 mg | ORAL_TABLET | Freq: Two times a day (BID) | ORAL | Status: DC
Start: 2021-10-04 — End: 2021-10-11
  Administered 2021-10-04 – 2021-10-10 (×13): 550 mg via ORAL
  Filled 2021-10-04 (×14): qty 1

## 2021-10-04 NOTE — Consult Note (Signed)
NAME:  Judy Meyer, MRN:  [id number], DOB:  1961-01-11, LOS: 1 ADMISSION DATE:  09/25/2021, CONSULTATION DATE:  2/26 REFERRING MD:  Johnney Ou, CHIEF COMPLAINT:  metabolic acidosis    History of Present Illness:  61 year old female patient With a significant history of alcohol abuse.  Also followed by [facility] hepatology clinic.  Presented to the emergency room on 2/25 with chief complaint of approximately 2-week history of progressive upper abdominal pain nausea vomiting and worsening jaundice.  Continues to drink alcohol however volume has been reduced due to nausea and vomiting.  Noting she had been urinating less, felt weak, overall presented with chief complaint of abdominal pain, and "turning yellow".  Seen by the internal medicine service diagnostic evaluation was compatible with acute alcoholic hepatitis, lactic acidosis, hyperammonemia, abnormal imaging by ultrasound of gallbladder suggesting gallbladder wall thickening and sonographic Murphy sign and encephalopathy.  She was started on IV hydration.  Empiric antibiotics were started, both GI and surgical services were consulted. Over the course of her hospitalization she has continued to have significant lactic acidosis, As high as greater than 9, with the lowest of 8.1.  At time of consultation 8.4.  In review of her blood chemistries she has had persistent decline in her serum bicarbonate.  Because of her profound metabolic derangements critical care was asked to evaluate, and determine if higher level of care was warranted.  Pertinent  Medical History  Hypertension, hypothyroidism, uterine fibroids, liver disease.  Significant Hospital Events: Including procedures, antibiotic start and stop dates in addition to other pertinent events   2/25: Admitted with working diagnosis of acute alcoholic hepatitis with elevated AST and ALT as well as alk phos.  Total bilirubin 33.5.  Ultrasound of abdomen obtained showed, pericholecystic  fluid and gallbladder wall thickening.  Last drink 24 hours prior to presentation.   2/26: Seen by both GI and general surgery.  They felt this was primarily just acute liver failure and unlikely infection but recommended continuing antibiotics.  Also seen by gastroenterology they started prednisolone as well as lactulose and rifaximin critical care asked to evaluate given severe metabolic acidosis   Interim History / Subjective:  Continues to report pain  Objective   Blood pressure 117/64, pulse 91, temperature 97.6 F (36.4 C), temperature source Oral, resp. rate 10, height _0  (1.676 m), weight 63.7 kg, last menstrual period 06/11/2018, SpO2 98 %.    FiO2 (%):  [21 %] 21 %   Intake/Output Summary (Last 24 hours) at 10/04/2021 1427 Last data filed at 10/04/2021 0700 Gross per 24 hour  Intake 2629.13 ml  Output 250 ml  Net 2379.13 ml   Filed Weights   10/04/21 0337  Weight: 63.7 kg    Examination: General: Jaundice ill-appearing 61 year old female sitting up in bed HENT: Normocephalic atraumatic sclera are icteric mucous membranes dry Lungs: Clear to auscultation Cardiovascular: Regular rate and rhythm Abdomen: Soft tender to palpation bowel sounds present Extremities: Diffusely jaundiced over entire body pulses palpable Neuro: Awake oriented x3 moving all extremities GU: [facility] Problem list     Assessment & Plan:  Acute alcoholic hepatitis with what appears to be acute hepatic failure Plan Starting prednisolone as directed by gastroenterology Alcohol cessation Serial LFTs  Hepatic encephalopathy she is at risk for alcohol withdrawal Actually alert and oriented on exam currently Plan Rifaximin and lactulose Currently on the CIWA protocol but we have not ordered and will not order lorazepam given underlying hepatic disorder  Acute metabolic acidosis.  This is a mixed picture of both anion gap metabolic acidosis from her slow to clear lactic  acidosis as well as non-anion gap component likely from nausea and vomiting and bicarbonate loss Plan Agree with IV bicarbonate supplementation Check arterial blood gas Close monitoring of blood chemistry  Fluid and electrolyte balance: Hyponatremia Plan Serial labs  Coagulopathy in the setting of alcoholic hepatitis Plan Vitamin K  Anemia of chronic disease Plan Trend CBC  History of seizure disorder Plan Keppra  Best Practice (right click and "Reselect all SmartList Selections" daily)   Diet/type: clear liquids DVT prophylaxis: SCD GI prophylaxis: N/A Lines: N/A Foley:  N/A Code Status:  full code Last date of multidisciplinary goals of care discussion [full code ]  Labs   CBC: Recent Labs  Lab 09/27/2021 1635 10/04/21 0330  WBC 17.2* 16.2*  HGB 10.8* 9.2*  HCT 31.6* 27.3*  MCV 97.2 98.9  PLT 203 093    Basic Metabolic Panel: Recent Labs  Lab 09/27/2021 1635 09/19/2021 1705 10/04/21 0021 10/04/21 0330  NA 132*  --   --  134*  K 2.4*  --   --  3.5  CL 95*  --   --  103  CO2 17*  --   --  10*  GLUCOSE 164*  --   --  106*  BUN 10  --   --  10  CREATININE 1.05*  --   --  0.92  CALCIUM 8.9  --   --  8.1*  MG  --  2.4 2.1  --   PHOS  --  3.2  --   --    GFR: Estimated Creatinine Clearance: 61.6 mL/min (by C-G formula based on SCr of 0.92 mg/dL). Recent Labs  Lab 10/02/2021 1635 09/19/2021 1803 10/04/21 0021 10/04/21 0313 10/04/21 0330 10/04/21 0700 10/04/21 0948  WBC 17.2*  --   --   --  16.2*  --   --   LATICACIDVEN  --    < > 8.3* >9.0*  --  8.7* 8.4*   < > = values in this interval not displayed.    Liver Function Tests: Recent Labs  Lab 09/11/2021 1635 10/04/21 0330  AST 317* 301*  ALT 80* 69*  ALKPHOS 175* 151*  BILITOT 33.5* 30.9*  PROT 7.2 6.2*  ALBUMIN 2.7* 2.3*   Recent Labs  Lab 09/21/2021 1635  LIPASE 93*   Recent Labs  Lab 09/16/2021 1713  AMMONIA 106*    ABG    Component Value Date/Time   HCO3 22.6 07/25/2021 0255    TCO2 14 (L) 03/16/2021 1007   ACIDBASEDEF 0.4 07/25/2021 0255   O2SAT 69.6 07/25/2021 0255     Coagulation Profile: Recent Labs  Lab 09/19/2021 1808 10/04/21 0330  INR 1.6* 1.7*    Cardiac Enzymes: No results for input(s): CKTOTAL, CKMB, CKMBINDEX, TROPONINI in the last 168 hours.  HbA1C: Hgb A1c MFr Bld  Date/Time Value Ref Range Status  07/18/2020 01:27 PM 5.1 4.8 - 5.6 % Final    Comment:    (NOTE) Pre diabetes:          5.7%-6.4%  Diabetes:              >6.4%  Glycemic control for   <7.0% adults with diabetes     CBG: No results for input(s): GLUCAP in the last 168 hours.  Review of Systems:   Not able   Past Medical History:  She,  has a past medical history of Hypertension, Hypothyroidism, Liver  disease, and Uterine fibroid.   Surgical History:   Past Surgical History:  Procedure Laterality Date   hand fracture surgery Right    LIVER BIOPSY       Social History:   reports that she has never smoked. She has never used smokeless tobacco. She reports current alcohol use. She reports that she does not use drugs.   Family History:  Her family history includes High Cholesterol in her mother; Hypertension in her mother and another family member.   Allergies Allergies  Allergen Reactions   Compazine [Prochlorperazine Edisylate] Anaphylaxis   Haloperidol Other (See Comments)    Other reaction(s): Seizure (finding) Other reaction(s): Seizure (finding)    Periactin [Cyproheptadine] Anaphylaxis   Cyclobenzaprine Other (See Comments)    Palpations    Haloperidol And Related Other (See Comments)    Caused seizure   Metoclopramide Other (See Comments)    Caused seizure     Home Medications  Prior to Admission medications   Medication Sig Start Date End Date Taking? Authorizing Provider  omeprazole (PRILOSEC) 20 MG capsule Take 20 mg by mouth daily.   Yes [provider]  albuterol (VENTOLIN HFA) 108 (90 Base) MCG/ACT inhaler Inhale 2 puffs into  the lungs 4 (four) times daily as needed for wheezing or shortness of breath. Patient not taking: Reported on 09/14/2021 12/20/19   [provider]  amLODipine (NORVASC) 10 MG tablet Take 1 tablet (10 mg total) by mouth daily. Patient not taking: Reported on 09/26/2021 11/21/20   Mercy Riding, MD  levETIRAcetam (KEPPRA) 500 MG tablet Take 1 tablet (500 mg total) by mouth 2 (two) times daily. Patient not taking: Reported on 09/18/2021 03/20/21   Hosie Poisson, MD  levothyroxine (SYNTHROID) 75 MCG tablet Take 1 tablet (75 mcg total) by mouth daily at 6 (six) AM. Patient not taking: Reported on 09/09/2021 03/21/21   Hosie Poisson, MD  ondansetron (ZOFRAN) 4 MG tablet Take 1 tablet (4 mg total) by mouth daily as needed for nausea or vomiting. Patient not taking: Reported on 09/17/2021 11/20/20 11/20/21  Mercy Riding, MD  ondansetron (ZOFRAN) 4 MG tablet Take 1 tablet (4 mg total) by mouth every 6 (six) hours. Patient not taking: Reported on 09/12/2021 07/25/21   Valarie Merino, MD  potassium chloride SA (KLOR-CON) 20 MEQ tablet Take 1 tablet (20 mEq total) by mouth daily. Patient not taking: Reported on 09/14/2021 03/20/21   Hosie Poisson, MD  venlafaxine XR (EFFEXOR-XR) 75 MG 24 hr capsule Take 1 capsule (75 mg total) by mouth daily. Patient not taking: Reported on 09/26/2021 11/20/20   Mercy Riding, MD     Critical care time: 32 min      Erick Colace ACNP-BC [facility] Pager # [phone] OR # [phone] if no answer

## 2021-10-04 NOTE — Consult Note (Signed)
[facility] Consult Note   History GERI HEPLER MRN # [id number]  Date of Admission: 10/05/2021 Date of Consultation: 10/04/2021 Referring physician: Dr. Jimmye Norman, Elaina Pattee, MD Primary Care Provider: Pcp, No Primary Gastroenterologist: Most recently [facility] practice, primary hepatology care with [facility]   Reason for Consultation/Chief Complaint: Abdominal pain, jaundice and acute alcoholic hepatitis  Subjective  HPI:  This is a 61 year old woman not previously seen by our practice.  She has been a patient of the [facility] as recently as early of 2022.  It appears that the patient was then seen in outpatient consultation by the [facility] hepatology clinic and has continued care with them as well.  Our group was consulted for this admission, we communicated with Dr. Alessandra Bevels, who replied that because this patient had transferred her liver care to the [facility] hepatology clinic, she would not be considered unassigned for inpatient consult purposes.  This patient was admitted yesterday for about 2 weeks of progressive upper abdominal pain, nausea vomiting and jaundice.  The history of her liver condition is well outlined in the internal medicine resident history and physical.  She has chronic alcoholic liver disease and is continue to consume alcohol as recently as the day prior to admission.  She has severe alcoholic hepatitis, persistent metabolic acidosis and ongoing upper abdominal pain.  It is difficult to her to characterize the pain, but it is fairly constant and in the upper abdomen, more toward the right side.  She has poor p.o. intake and nausea with intermittent vomiting.  She was seen in surgical consultation today by Dr. Nadeen Landau, who could not exclude cholecystitis based on the overall clinical picture including history exam and imaging, but agree she is high risk for operative intervention.  She is therefore on Zosyn  for empiric treatment of cholecystitis.  ROS:  Constitutional: Generalized fatigue Eyes:   Icterus Pruritus Psychiatric:   Depression  All other systems are negative except as noted above in the HPI  Past Medical History Past Medical History:  Diagnosis Date   Hypertension    Hypothyroidism    Liver disease    Uterine fibroid     Past Surgical History Past Surgical History:  Procedure Laterality Date   hand fracture surgery Right    LIVER BIOPSY      Family History Family History  Problem Relation Age of Onset   Hypertension Other    Hypertension Mother    High Cholesterol Mother     Social History Social History   Socioeconomic History   Marital status: Single    Spouse name: Not on file   Number of children: Not on file   Years of education: Not on file   Highest education level: Not on file  Occupational History   Not on file  Tobacco Use   Smoking status: Never   Smokeless tobacco: Never  Vaping Use   Vaping Use: Some days   Start date: 09/09/2020  Substance and Sexual Activity   Alcohol use: Yes   Drug use: Never   Sexual activity: Yes  Other Topics Concern   Not on file  Social History Narrative   Not on file   Social Determinants of Health   Financial Resource Strain: Not on file  Food Insecurity: Not on file  Transportation Needs: Not on file  Physical Activity: Not on file  Stress: Not on file  Social Connections: Not on file    Allergies Allergies  Allergen  Reactions   Compazine [Prochlorperazine Edisylate] Anaphylaxis   Haloperidol Other (See Comments)    Other reaction(s): Seizure (finding) Other reaction(s): Seizure (finding)    Periactin [Cyproheptadine] Anaphylaxis   Cyclobenzaprine Other (See Comments)    Palpations    Haloperidol And Related Other (See Comments)    Caused seizure   Metoclopramide Other (See Comments)    Caused seizure    Outpatient Meds Home medications from the H+P and/or nursing med  reconciliation reviewed. Outpatient medicines include amlodipine, Keppra, Synthroid, Effexor, potassium, Zofran, omeprazole and albuterol depression Inpatient med list reviewed  _____________________________________________________________________ Objective   Exam:  Current vital signs  Patient Vitals for the past 8 hrs:  BP Temp Temp src Pulse Resp SpO2  10/04/21 1158 117/64 97.6 F (36.4 C) Oral 91 10 98 %  10/04/21 0744 136/64 97.7 F (36.5 C) Oral 96 16 97 %  10/04/21 0729 136/64 -- -- 96 -- --    Intake/Output Summary (Last 24 hours) at 10/04/2021 1231 Last data filed at 10/04/2021 0700 Gross per 24 hour  Intake 2629.13 ml  Output 250 ml  Net 2379.13 ml    Physical Exam:   General: this is an acutely and chronically ill female patient who is somnolent deeply jaundiced.  This limits her history to give history and review of systems. Eyes: sclera deeply icteric, no redness ENT: oral mucosa moist without lesions, no cervical or supraclavicular lymphadenopathy CV: RRR without murmur, S1/S2, no JVD,, no peripheral edema Resp: clear to auscultation bilaterally, normal RR and effort noted GI: soft, epigastric and right upper quadrant tenderness, with active bowel sounds.  No bruit, no distention or bulging flanks.  Difficult to assess for hepatomegaly or splenomegaly due to abdominal tenderness. Skin; warm and dry, excoriations, + jaundice Neuro: Somnolent.  Speech slow but fluent.  Tremulous, no asterixis.  Normal gross motor function.  Labs:  CBC Latest Ref Rng & Units 10/04/2021 10/06/2021 07/24/2021  WBC 4.0 - 10.5 K/uL 16.2(H) 17.2(H) 6.6  Hemoglobin 12.0 - 15.0 g/dL 9.2(L) 10.8(L) 10.3(L)  Hematocrit 36.0 - 46.0 % 27.3(L) 31.6(L) 32.9(L)  Platelets 150 - 400 K/uL 172 203 165    CMP Latest Ref Rng & Units 10/04/2021 09/20/2021 07/25/2021  Glucose 70 - 99 mg/dL 106(H) 164(H) 118(H)  BUN 6 - 20 mg/dL 10 10 12   Creatinine 0.44 - 1.00 mg/dL 0.92 1.05(H) 1.03(H)  Sodium  135 - 145 mmol/L 134(L) 132(L) 135  Potassium 3.5 - 5.1 mmol/L 3.5 2.4(LL) 2.4(LL)  Chloride 98 - 111 mmol/L 103 95(L) 95(L)  CO2 22 - 32 mmol/L 10(L) 17(L) 21(L)  Calcium 8.9 - 10.3 mg/dL 8.1(L) 8.9 9.5  Total Protein 6.5 - 8.1 g/dL 6.2(L) 7.2 9.0(H)  Total Bilirubin 0.3 - 1.2 mg/dL 30.9(HH) 33.5(HH) 2.7(H)  Alkaline Phos 38 - 126 U/L 151(H) 175(H) 121  AST 15 - 41 U/L 301(H) 317(H) 61(H)  ALT 0 - 44 U/L 69(H) 80(H) 27    Discriminant function = 54  Recent Labs  Lab 10/04/21 0330  INR 1.7*  INR was 1.6 on admission  Venous lactic acid persistently elevated at 8.4 today  Ammonia 106 on admission yesterday  Alcohol level 216 on admission last evening No acetaminophen level this admission _________________________________________________________ Radiologic studies:  CLINICAL DATA:  Acute on chronic liver disease with jaundice, earlier study today showing gallbladder thickening and positive sonographic Murphy's sign.   EXAM: ABDOMEN ULTRASOUND COMPLETE   COMPARISON:  The CT with IV contrast 07/25/2021.   FINDINGS: Gallbladder: There is hypoechoic layering sludge  without visible shadowing stones. There is trace pericholecystic fluid. The free wall is thickened up to 8.1 mm and there is positive right upper abdominal tenderness concerning for acute cholecystitis.   The wall thickening and positive right upper abdominal tenderness could alternatively have a basis in hepatic dysfunction and/or hepatitis or other adjacent inflammatory process. Wall thickening was not seen on the 07/25/2021 CT.   Common bile duct: Diameter: Prominent measuring 8.1 mm, but previously measured 7 mm. No intrahepatic biliary dilatation.   Liver: No focal lesion identified through the patient's steatosis. The liver is diffusely attenuating consistent with fatty replacement, with moderate to severe fatty replacement noted on the CT.   The liver length was not measured today but was  previously 17 cm. Portal vein is patent on color Doppler imaging with reversal of flow. It is otherwise not well seen today but previously measured prominent at 14 mm.   IVC: No abnormality visualized.   Pancreas: Body of the pancreas is visible and unremarkable. The head, neck and tail segments are obscured by bowel gas.   Spleen: Size and appearance within normal limits.   Right Kidney: Length: 11.4 cm. Cortical echogenicity and thickness within normal limits. No hydronephrosis or stone visualized. Again noted is a 3.5 cm mass posteriorly with uniform increased echogenicity consistent with the angiomyolipoma noted on the CT, lower pole.   No other mass is seen. There was a 3 mm lower pole caliceal stone on the CT but it is not visible sonographically , possibly obscured by bowel gas shadowing or passed in the interval.   Left Kidney: Length: 9.5 cm. Echogenicity within normal limits. No mass, stone or hydronephrosis visualized.   Abdominal aorta: No aneurysm visualized.   Other findings: No ascites.   IMPRESSION: 1. Gallbladder sludge without shadowing stones, with wall thickening, pericholecystic fluid and positive sonographic Murphy sign. Findings suspicious for cholecystitis. With hepatic dysfunction, alternative etiology could be congestive or reactive thickening such as with hepatitis, or adjacent inflammatory process. 2. The pancreas mostly obscured but normal where visible. 3. Prominent common bile duct.  No intrahepatic biliary prominence. 4. Diffuse liver attenuation consistent with the fatty replacement noted on the CT. 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 6. Reversed flow in the hepatic portal vein. The portal vein was otherwise not well enough visualized to measure its diameter but it was previously slightly prominent.     Electronically Signed   By: Telford Nab M.D.   On: 10/05/2021  22:45  ______________________________________________________ Other studies:   _______________________________________________________ Assessment & Plan  Impression: Acute alcoholic hepatitis with jaundice, hyperammonemia and encephalopathy Severe persistent metabolic acidosis Upper abdominal pain with abnormal imaging of the gallbladder. Pruritus due to jaundice  I agree with our surgical consultant that it is impossible to know if this patient has acute cholecystitis with infection given the overall clinical picture.  It is more likely that she has gallbladder inflammation from the severity of the alcoholic hepatitis with hepatic congestion and portal hypertension.  She is on empiric Zosyn, which seems reasonable. The hepatofugal flow in some of the portal system is worrisome, and while there is no reported thrombosis, she is certainly at risk of that occurring.  She is clinically encephalopathic and requires treatment for that.  I am particular concerned that she has persistent metabolic acidosis.  I do not yet know if she has been seen by the internal medicine team today.  If so, there is not yet any note in place.  Serious consideration should  be given to a bicarb drip for this patient.  The severity of her acute alcoholic hepatitis warrants treatment with prednisolone.   Plan: Prednisolone 40 mg once daily Lactulose 20 g 3 times daily Rifaximin 550 mg, 1 tablet twice daily Recommend internal medicine service consider bicarb fluids for this patient who has persistent acidosis.  Also consider Atarax for pruritus. CBC, CMP and PT/INR tomorrow Monitor closely for alcohol withdrawal and worsening of encephalopathy with risk of respiratory suppression.  Full liquid diet with additional protein calorie liquid supplements.  HIDA scan was ordered for this patient, but I have canceled it.  It will not be helpful study because the tracer will not be excreted from the liver given the  severity of her cholestasis.  [facility] will follow this patient during hospitalization, after which her hepatology care will be returned to the [facility] clinic with whom she has been following.  (Dr. Silvano Rusk will manage I will consult service starting tomorrow)  Total time spent on this consult 75 minutes.  Complex patient, extensive chart review, in person time, documentation and care coordination required.  High medical decision making.  Nelida Meuse III Office: [phone]

## 2021-10-04 NOTE — Progress Notes (Addendum)
HD#1 Subjective:  Overnight Events: Admitted overnight  [name] is resting bed comfortably.  She continues to endorse right upper quadrant abdominal pain.  She notes that since her diagnosis she has tried to decrease her alcohol consumption to a few times a week she " sips of vodka."  Patient states that she came to the ED as she was having persistent abdominal pain, nausea, vomiting.  She notes that she has never had this yellow skin tone before.  Objective:  Vital signs in last 24 hours: Vitals:   10/04/21 0412 10/04/21 0729 10/04/21 0744 10/04/21 1158  BP: (!) 118/53 136/64 136/64 117/64  Pulse: 94 96 96 91  Resp: 20  16 10   Temp: 98.5 F (36.9 C)  97.7 F (36.5 C) 97.6 F (36.4 C)  TempSrc: Oral  Oral Oral  SpO2: 97%  97% 98%  Weight:      Height:       Supplemental O2: 98% room air  Physical Exam:  Constitutional: Ill-appearing, deeply jaundiced HENT: normocephalic atraumatic, mucous membranes dry.  Yellow discoloration of tongue Eyes: Scleral icterus. Neck: supple Cardiovascular: regular rate and rhythm, no m/r/g Pulmonary/Chest: normal work of breathing on room air, lungs clear to auscultation bilaterally Abdominal: Abdomen soft, nondistended, bowel sounds present.  Right upper quadrant tenderness.  Exam limited by patient's pain.  Difficult to deeply palpate for splenomegaly and/or hepatomegaly MSK: normal bulk and tone Neurological: alert & oriented x 3 normal mood and thought process Skin: warm and dry, jaundice Psych: Tearful on exam  Filed Weights   10/04/21 0337  Weight: 63.7 kg     Intake/Output Summary (Last 24 hours) at 10/04/2021 1406 Last data filed at 10/04/2021 0700 Gross per 24 hour  Intake 2629.13 ml  Output 250 ml  Net 2379.13 ml   Net IO Since Admission: 2,379.13 mL [10/04/21 1406]  Pertinent Labs: CBC Latest Ref Rng & Units 10/04/2021 09/24/2021 07/24/2021  WBC 4.0 - 10.5 K/uL 16.2(H) 17.2(H) 6.6  Hemoglobin 12.0 - 15.0 g/dL 9.2(L)  10.8(L) 10.3(L)  Hematocrit 36.0 - 46.0 % 27.3(L) 31.6(L) 32.9(L)  Platelets 150 - 400 K/uL 172 203 165    CMP Latest Ref Rng & Units 10/04/2021 09/20/2021 07/25/2021  Glucose 70 - 99 mg/dL 106(H) 164(H) 118(H)  BUN 6 - 20 mg/dL 10 10 12   Creatinine 0.44 - 1.00 mg/dL 0.92 1.05(H) 1.03(H)  Sodium 135 - 145 mmol/L 134(L) 132(L) 135  Potassium 3.5 - 5.1 mmol/L 3.5 2.4(LL) 2.4(LL)  Chloride 98 - 111 mmol/L 103 95(L) 95(L)  CO2 22 - 32 mmol/L 10(L) 17(L) 21(L)  Calcium 8.9 - 10.3 mg/dL 8.1(L) 8.9 9.5  Total Protein 6.5 - 8.1 g/dL 6.2(L) 7.2 9.0(H)  Total Bilirubin 0.3 - 1.2 mg/dL 30.9(HH) 33.5(HH) 2.7(H)  Alkaline Phos 38 - 126 U/L 151(H) 175(H) 121  AST 15 - 41 U/L 301(H) 317(H) 61(H)  ALT 0 - 44 U/L 69(H) 80(H) 27    Imaging: US Abdomen Complete  Result Date: 10/05/2021 CLINICAL DATA:  Acute on chronic liver disease with jaundice, earlier study today showing gallbladder thickening and positive sonographic Murphy's sign. EXAM: ABDOMEN ULTRASOUND COMPLETE COMPARISON:  The CT with IV contrast 07/25/2021. FINDINGS: Gallbladder: There is hypoechoic layering sludge without visible shadowing stones. There is trace pericholecystic fluid. The free wall is thickened up to 8.1 mm and there is positive right upper abdominal tenderness concerning for acute cholecystitis. The wall thickening and positive right upper abdominal tenderness could alternatively have a basis in hepatic dysfunction and/or hepatitis  or other adjacent inflammatory process. Wall thickening was not seen on the 07/25/2021 CT. Common bile duct: Diameter: Prominent measuring 8.1 mm, but previously measured 7 mm. No intrahepatic biliary dilatation. Liver: No focal lesion identified through the patient's steatosis. The liver is diffusely attenuating consistent with fatty replacement, with moderate to severe fatty replacement noted on the CT. The liver length was not measured today but was previously 17 cm. Portal vein is patent on color  Doppler imaging with reversal of flow. It is otherwise not well seen today but previously measured prominent at 14 mm. IVC: No abnormality visualized. Pancreas: Body of the pancreas is visible and unremarkable. The head, neck and tail segments are obscured by bowel gas. Spleen: Size and appearance within normal limits. Right Kidney: Length: 11.4 cm. Cortical echogenicity and thickness within normal limits. No hydronephrosis or stone visualized. Again noted is a 3.5 cm mass posteriorly with uniform increased echogenicity consistent with the angiomyolipoma noted on the CT, lower pole. No other mass is seen. There was a 3 mm lower pole caliceal stone on the CT but it is not visible sonographically , possibly obscured by bowel gas shadowing or passed in the interval. Left Kidney: Length: 9.5 cm. Echogenicity within normal limits. No mass, stone or hydronephrosis visualized. Abdominal aorta: No aneurysm visualized. Other findings: No ascites. IMPRESSION: 1. Gallbladder sludge without shadowing stones, with wall thickening, pericholecystic fluid and positive sonographic Murphy sign. Findings suspicious for cholecystitis. With hepatic dysfunction, alternative etiology could be congestive or reactive thickening such as with hepatitis, or adjacent inflammatory process. 2. The pancreas mostly obscured but normal where visible. 3. Prominent common bile duct.  No intrahepatic biliary prominence. 4. Diffuse liver attenuation consistent with the fatty replacement noted on the CT. 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 6. Reversed flow in the hepatic portal vein. The portal vein was otherwise not well enough visualized to measure its diameter but it was previously slightly prominent. Electronically Signed   By: Telford Nab M.D.   On: 09/26/2021 22:45   DG CHEST PORT 1 VIEW  Result Date: 09/29/2021 CLINICAL DATA:  Jaundice, abdominal pain and sepsis. EXAM: PORTABLE CHEST 1 VIEW COMPARISON:  PA Lat 05/14/2006. FINDINGS: The  heart size and mediastinal contours are within normal limits. Both lungs are clear apart from linear scarring or atelectasis laterally in the left base. The visualized skeletal structures are unremarkable. IMPRESSION: No evidence of acute chest disease. Electronically Signed   By: Telford Nab M.D.   On: 10/02/2021 21:03   US LIVER DOPPLER  Result Date: 09/20/2021 CLINICAL DATA:  Jaundice, acute on chronic liver disease EXAM: DUPLEX ULTRASOUND OF LIVER TECHNIQUE: Color and duplex Doppler ultrasound was performed to evaluate the hepatic in-flow and out-flow vessels. COMPARISON:  CT 07/25/2021 FINDINGS: Liver: Diffusely increased parenchymal echogenicity with extremely poor acoustic through transmission in keeping with changes of severe hepatic steatosis. Assessment of the hepatic parenchyma is resultantly markedly limited. Limited images of the hepatic contour demonstrate no obvious nodularity. No definite focal intrahepatic mass identified. Main Portal Vein size: 12 mm cm Portal Vein Velocities Main Prox:  -63 cm/sec Main Mid: -55 cm/sec Main Dist:  -79 cm/sec Right: -38 cm/sec Left: 35 cm/sec Hepatic Vein Velocities Right:  17 cm/sec Middle:  30 cm/sec Left:  24 cm/sec IVC: Patent Hepatic Artery Velocity:  67 cm/sec Splenic Vein Velocity:  14 cm/sec Spleen: 6.9 cm x 10.4 cm x 8.3 cm with a total volume of 313 cm^3 (411 cm^3 is upper limit normal) Portal Vein  Occlusion/Thrombus: No Splenic Vein Occlusion/Thrombus: No Ascites: None Varices: None Superior mesenteric vein appears patent centrally. IMPRESSION: Markedly limited examination secondary to severe hepatic steatosis and resultant poor acoustic through transmission. Hepatofugal flow within the right portal vein and main portal vein. Preserved antegrade flow within the left portal vein. Splenic vein and superior mesenteric vein appear patent. Electronically Signed   By: Fidela Salisbury M.D.   On: 09/22/2021 22:42   US Abdomen Limited RUQ  (LIVER/GB)  Result Date: 09/23/2021 CLINICAL DATA:  RIGHT-sided abdominal pain.  Jaundice EXAM: ULTRASOUND ABDOMEN LIMITED RIGHT UPPER QUADRANT COMPARISON:  CT 07/25/2021, ultrasound 07/24/2021 FINDINGS: Gallbladder: Sludge within the gallbladder. The gallbladder wall is thickened. Positive sonographic Murphy's sign. Common bile duct: Diameter: Normal at 4 mm Liver: Increased echogenicity. Portal vein is patent on color Doppler imaging with normal direction of blood flow towards the liver. Other: None. IMPRESSION: Pericholecystic fluid, gallbladder wall thickening and positive sonographic Murphy's sign are concerning for acute cholecystitis. Electronically Signed   By: Suzy Bouchard M.D.   On: 09/17/2021 18:07    Assessment/Plan:   Principal Problem:   Alcoholic hepatitis Active Problems:   Hypokalemia   Hyperbilirubinemia   Acidosis, metabolic   Leukocytosis   Lactic acidosis   Patient Summary: Judy Meyer is a 61 y.o. with a pertinent PMH of alcohol associated hepatitis, hypothyroidism, hypertension, seizures, anemia, hyperlipidemia and alcohol use disorder who presented with worsening of her jaundice, nausea, vomiting and admitted for acute alcoholic hepatitis.   Acute alcoholic hepatitis Patient presented with nausea vomiting and found to have severe jaundice, hyperammonemia, encephalopathy.  Appreciate GIs assistance in her care, suspect that she is having gallbladder inflammation secondary to her severe alcoholic hepatitis.  She is also found to have portal hypertension and hepatic congestion.  -Prednisolone 40 mg daily -Lactulose 20 g 3 times daily -Appreciate Dr. Loletha Carrow' recommendations  Persistent anion gap metabolic acidosis Patient with significantly low bicarb and persistently elevated lactic acid.  Suspect this is secondary to her severe liver disease.  We will start patient on bicarb drip and continue to monitor BMPs every 2 hours. -Bicarb drip per pharmacy. Will run  with D5, if major electrolyte shifts will adjust fluids as necessary.  -q2h bmp  Acute cholecystitis Imaging concerning for possible acute cholecystitis.  General surgery consulted who did not believe there is a role for surgical intervention due to the severity of her liver disease.  We will continue with IV Zosyn and continue to manage medically.  High risk for alcohol withdrawal Alcohol Use Disorder Patient is high risk for severe alcohol withdrawal.  Currently on CIWA protocol without Ativan. Continue to monitor and if begins showing signs of withdrawal, consider addition of librium. Would avoid ativan with liver disease.   Electrolyte abnormalities Continue to replete as needed.  Will trend with every 2 hours BMPs and adjust fluids as needed per  Hx of seizure disorder Continue keppra 500 mg bid  Overall patient is severely ill with multiple high risk features including worsening of her hepatitis, her persistently elevated anion gap metabolic acidosis and high risk for alcohol withdrawal.  Due to all the above and recommendations per GI, will consult PCCM for possible transfer to ICU.  Diet: Full liquid diet, feeding supplements VTE: SCDs Code: Full PT/OT recs: Pending.  Not medically stable for evaluation  Dispo: Pending further work-up and evaluation for acute hepatitis, encephalopathy and severe metabolic acidosis  Sanjuana Letters DO Internal Medicine Resident PGY-2 Pager [phone] Please contact the on call  pager after 5 pm and on weekends at [phone].

## 2021-10-04 NOTE — Consult Note (Signed)
CC/Reason for consult: Rule out cholecystitis  Requesting physician: Dr. Lerry Liner  HPI: Judy Meyer is an 61 y.o. female with hx of HTN, hypothyroidism, H63D genetic mutation for hereditary hemochromatosi, nephrolithiasis, alcohol abuse and liver disease, alcohol hepatitis, whom follows with hepatology at [facility].  She reports a long history of at least a few months now of abdominal pain with associated nausea and vomiting.  Her symptoms have persisted and slowly progressed over the ensuing 2 months.  She reports that the pain is somewhat generalized but most noticeable in the right upper quadrant.  Nothing has acutely worsened over the last 2 weeks.  The pain is described as a sharp/crampy type feeling.  She denies fever or chills.  She denies any sick contacts.  Last appointment with her hepatologist was July, 2022.  They noted history at that time of recurrent/cyclical events of nausea vomiting and hospital presentations.  Ultrasound 07/18/2020 showed gallstones and sludge.  She had a subsequent MRCP 07/21/2020 that showed no biliary ductal dilatation or liver lesions.  Hepatomegaly is apparent.  At a previous admission, 11/2020, she was seen by our service.  HIDA scan was recommended.  This demonstrated gallbladder filling and no evident cholecystitis.  No significant emptying was appreciated.  She has had multiple studies since including CT scans of the abdomen/pelvis showing unchanged appearance of the gallbladder with a hydropic appearance.  Multiple right upper quadrant ultrasounds.  Right upper quadrant ultrasound 09/27/2021 shows pericholecystic fluid with some wall thickening.  09/14/2021 complete abdominal US shows gallbladder sludge without stones, wall thickening, pericholecystic fluid.  Differential diagnosis is wide with her underlying liver disease including cholecystitis, congestive/reactive thickening from hepatitis.  CBD 8 mm. Hepatofugal flow in right and main portal vein,  preserved antegrade flow in left portal vein. No appreciable ascites  Alb 2.7 on admission Tbili 33.5 -->30 Ammonia 106 AST/ALT 317/80 Alk phos 175 Lipase 93  Cr 0.92  INR 1.6 --> 1.7 PTT 26  WBC 17 --> 16 Plt 172   Past Medical History:  Diagnosis Date   Hypertension    Hypothyroidism    Liver disease    Uterine fibroid     Past Surgical History:  Procedure Laterality Date   hand fracture surgery Right    LIVER BIOPSY      Family History  Problem Relation Age of Onset   Hypertension Other    Hypertension Mother    High Cholesterol Mother     Social:  reports that she has never smoked. She has never used smokeless tobacco. She reports current alcohol use. She reports that she does not use drugs.  Allergies:  Allergies  Allergen Reactions   Compazine [Prochlorperazine Edisylate] Anaphylaxis   Haloperidol Other (See Comments)    Other reaction(s): Seizure (finding) Other reaction(s): Seizure (finding)    Periactin [Cyproheptadine] Anaphylaxis   Cyclobenzaprine Other (See Comments)    Palpations    Haloperidol And Related Other (See Comments)    Caused seizure   Metoclopramide Other (See Comments)    Caused seizure    Medications: I have reviewed the patient's current medications.  Results for orders placed or performed during the hospital encounter of 09/13/2021 (from the past 48 hour(s))  Lipase, blood     Status: Abnormal   Collection Time: 10/01/2021  4:35 PM  Result Value Ref Range   Lipase 93 (H) 11 - 51 U/L    Comment: Performed at [facility], [address], [place], [place] [address]  Comprehensive  metabolic panel     Status: Abnormal   Collection Time: 09/14/2021  4:35 PM  Result Value Ref Range   Sodium 132 (L) 135 - 145 mmol/L   Potassium 2.4 (LL) 3.5 - 5.1 mmol/L    Comment: CRITICAL RESULT CALLED TO, READ BACK BY AND VERIFIED WITH: C.COBB,RN 09/18/2021 AT 1756 A.HUGHES    Chloride 95 (L) 98 - 111 mmol/L   CO2 17 (L) 22 - 32  mmol/L   Glucose, Bld 164 (H) 70 - 99 mg/dL    Comment: Glucose reference range applies only to samples taken after fasting for at least 8 hours.   BUN 10 6 - 20 mg/dL   Creatinine, Ser 1.05 (H) 0.44 - 1.00 mg/dL   Calcium 8.9 8.9 - 10.3 mg/dL   Total Protein 7.2 6.5 - 8.1 g/dL   Albumin 2.7 (L) 3.5 - 5.0 g/dL   AST 317 (H) 15 - 41 U/L    Comment: RESULTS CONFIRMED BY MANUAL DILUTION   ALT 80 (H) 0 - 44 U/L   Alkaline Phosphatase 175 (H) 38 - 126 U/L   Total Bilirubin 33.5 (HH) 0.3 - 1.2 mg/dL    Comment: CRITICAL RESULT CALLED TO, READ BACK BY AND VERIFIED WITH: C.COBB,RN 10/02/2021 AT 1905 A.HUGHES    GFR, Estimated >60 >60 mL/min    Comment: (NOTE) Calculated using the CKD-EPI Creatinine Equation (2021)    Anion gap 20 (H) 5 - 15    Comment: Performed at [facility] [address], [place], [place] [address]  CBC     Status: Abnormal   Collection Time: 09/24/2021  4:35 PM  Result Value Ref Range   WBC 17.2 (H) 4.0 - 10.5 K/uL   RBC 3.25 (L) 3.87 - 5.11 MIL/uL   Hemoglobin 10.8 (L) 12.0 - 15.0 g/dL   HCT 31.6 (L) 36.0 - 46.0 %   MCV 97.2 80.0 - 100.0 fL   MCH 33.2 26.0 - 34.0 pg   MCHC 34.2 30.0 - 36.0 g/dL   RDW 32.2 (H) 11.5 - 15.5 %   Platelets 203 150 - 400 K/uL   nRBC 0.7 (H) 0.0 - 0.2 %    Comment: Performed at [facility] [address], [place], [place] [address]  Resp Panel by RT-PCR (Flu A&B, Covid) Nasopharyngeal Swab     Status: None   Collection Time: 09/24/2021  4:39 PM   Specimen: Nasopharyngeal Swab; Nasopharyngeal(NP) swabs in vial transport medium  Result Value Ref Range   SARS Coronavirus 2 by RT PCR NEGATIVE NEGATIVE    Comment: (NOTE) SARS-CoV-2 target nucleic acids are NOT DETECTED.  The SARS-CoV-2 RNA is generally detectable in upper respiratory specimens during the acute phase of infection. The lowest concentration of SARS-CoV-2 viral copies this assay can detect is 138 copies/mL. A negative result does not preclude  SARS-Cov-2 infection and should not be used as the sole basis for treatment or other patient management decisions. A negative result may occur with  improper specimen collection/handling, submission of specimen other than nasopharyngeal swab, presence of viral mutation(s) within the areas targeted by this assay, and inadequate number of viral copies(<138 copies/mL). A negative result must be combined with clinical observations, patient history, and epidemiological information. The expected result is Negative.  Fact Sheet for Patients:  EntrepreneurPulse.com.au  Fact Sheet for Healthcare Providers:  IncredibleEmployment.be  This test is no t yet approved or cleared by the [place] FDA and  has been authorized for detection and/or diagnosis of  SARS-CoV-2 by FDA under an Emergency Use Authorization (EUA). This EUA will remain  in effect (meaning this test can be used) for the duration of the COVID-19 declaration under Section 564(b)(1) of the Act, 21 U.S.C.section 360bbb-3(b)(1), unless the authorization is terminated  or revoked sooner.       Influenza A by PCR NEGATIVE NEGATIVE   Influenza B by PCR NEGATIVE NEGATIVE    Comment: (NOTE) The Xpert Xpress SARS-CoV-2/FLU/RSV plus assay is intended as an aid in the diagnosis of influenza from Nasopharyngeal swab specimens and should not be used as a sole basis for treatment. Nasal washings and aspirates are unacceptable for Xpert Xpress SARS-CoV-2/FLU/RSV testing.  Fact Sheet for Patients: EntrepreneurPulse.com.au  Fact Sheet for Healthcare Providers: IncredibleEmployment.be  This test is not yet approved or cleared by the [place] FDA and has been authorized for detection and/or diagnosis of SARS-CoV-2 by FDA under an Emergency Use Authorization (EUA). This EUA will remain in effect (meaning this test can be used) for the duration of the COVID-19  declaration under Section 564(b)(1) of the Act, 21 U.S.C. section 360bbb-3(b)(1), unless the authorization is terminated or revoked.  Performed at [facility], [place] [address], [place], [place] [address]   I-Stat beta hCG blood, ED     Status: None   Collection Time: 09/17/2021  4:41 PM  Result Value Ref Range   I-stat hCG, quantitative <5.0 <5 mIU/mL   Comment 3            Comment:   GEST. AGE      CONC.  (mIU/mL)   <=1 WEEK        5 - 50     2 WEEKS       50 - 500     3 WEEKS       100 - 10,000     4 WEEKS     1,000 - 30,000        FEMALE AND NON-PREGNANT FEMALE:     LESS THAN 5 mIU/mL   Ethanol     Status: Abnormal   Collection Time: 10/05/2021  5:05 PM  Result Value Ref Range   Alcohol, Ethyl (B) 216 (H) <10 mg/dL    Comment: (NOTE) Lowest detectable limit for serum alcohol is 10 mg/dL.  For medical purposes only. Performed at [facility], [place] [address] [place], [place] [address]   Phosphorus     Status: None   Collection Time: 10/04/2021  5:05 PM  Result Value Ref Range   Phosphorus 3.2 2.5 - 4.6 mg/dL    Comment: Performed at [facility] [address], [place], [place] [address]  Magnesium     Status: None   Collection Time: 09/23/2021  5:05 PM  Result Value Ref Range   Magnesium 2.4 1.7 - 2.4 mg/dL    Comment: Performed at [facility], [place] [address], [place], [place] [address]  Ammonia     Status: Abnormal   Collection Time: 10/05/2021  5:13 PM  Result Value Ref Range   Ammonia 106 (H) 9 - 35 umol/L    Comment: Performed at [facility], [place] [address], [place], [place] [address]  Lactic acid, plasma     Status: Abnormal   Collection Time: 09/15/2021  6:03 PM  Result Value Ref Range   Lactic Acid, Venous >9.0 (HH) 0.5 - 1.9 mmol/L    Comment: CRITICAL RESULT CALLED TO, READ BACK BY AND VERIFIED WITH: C.COBB,RN  09/15/2021 AT 1849 A.HUGHES Performed at [facility], [place] [address] [place], [place] [address]   Protime-INR      Status: Abnormal   Collection Time: 09/29/2021  6:08 PM  Result Value Ref Range   Prothrombin Time 18.7 (H) 11.4 - 15.2 seconds   INR 1.6 (H) 0.8 - 1.2    Comment: (NOTE) INR goal varies based on device and disease states. Performed at [facility], [place] [address], [place], [place] [address]   Culture, blood (routine x 2)     Status: None (Preliminary result)   Collection Time: 10/05/2021  8:35 PM   Specimen: BLOOD RIGHT HAND  Result Value Ref Range   Specimen Description BLOOD RIGHT HAND    Special Requests      AEROBIC BOTTLE ONLY Blood Culture results may not be optimal due to an inadequate volume of blood received in culture bottles   Culture      NO GROWTH < 12 HOURS Performed at [facility] [address], [place], [place] [address]    Report Status PENDING   Lactic acid, plasma     Status: Abnormal   Collection Time: 09/09/2021 10:54 PM  Result Value Ref Range   Lactic Acid, Venous 8.1 (HH) 0.5 - 1.9 mmol/L    Comment: CRITICAL VALUE NOTED.  VALUE IS CONSISTENT WITH PREVIOUSLY REPORTED AND CALLED VALUE. Performed at [facility], [place] [address] [place], [place] [address]   HIV Antibody (routine testing w rflx)     Status: None   Collection Time: 10/04/21 12:21 AM  Result Value Ref Range   HIV Screen 4th Generation wRfx Non Reactive Non Reactive    Comment: Performed at [facility], [place] [address] [place], [place] [address]  TSH     Status: Abnormal   Collection Time: 10/04/21 12:21 AM  Result Value Ref Range   TSH 5.612 (H) 0.350 - 4.500 uIU/mL    Comment: Performed by a 3rd Generation assay with a functional sensitivity of <=0.01 uIU/mL. Performed at [facility], [place] [address], [place], [place] [address]   Magnesium     Status: None   Collection Time: 10/04/21 12:21 AM  Result Value Ref Range   Magnesium 2.1 1.7 - 2.4 mg/dL    Comment: Performed at [facility] [address] [place], [place] [address]  Hepatitis panel,  acute     Status: None   Collection Time: 10/04/21 12:21 AM  Result Value Ref Range   Hepatitis B Surface Ag NON REACTIVE NON REACTIVE   HCV Ab NON REACTIVE NON REACTIVE    Comment: (NOTE) Nonreactive HCV antibody screen is consistent with no HCV infections,  unless recent infection is suspected or other evidence exists to indicate HCV infection.     Hep A IgM NON REACTIVE NON REACTIVE   Hep B C IgM NON REACTIVE NON REACTIVE    Comment: Performed at [facility], [place] [address], [place], [place] [address]  Lactic acid, plasma     Status: Abnormal   Collection Time: 10/04/21 12:21 AM  Result Value Ref Range   Lactic Acid, Venous 8.3 (HH) 0.5 - 1.9 mmol/L    Comment: CRITICAL VALUE NOTED.  VALUE IS CONSISTENT WITH PREVIOUSLY REPORTED AND CALLED VALUE. Performed at [facility], [place] [address], [place], [place] [address]   Urinalysis, Routine w reflex microscopic Urine, Clean Catch     Status: Abnormal  Collection Time: 10/04/21  2:34 AM  Result Value Ref Range   Color, Urine AMBER (A) YELLOW    Comment: BIOCHEMICALS MAY BE AFFECTED BY COLOR   APPearance CLOUDY (A) CLEAR   Specific Gravity, Urine 1.012 1.005 - 1.030   pH 5.0 5.0 - 8.0   Glucose, UA NEGATIVE NEGATIVE mg/dL   Hgb urine dipstick SMALL (A) NEGATIVE   Bilirubin Urine MODERATE (A) NEGATIVE   Ketones, ur NEGATIVE NEGATIVE mg/dL   Protein, ur 30 (A) NEGATIVE mg/dL   Nitrite NEGATIVE NEGATIVE   Leukocytes,Ua TRACE (A) NEGATIVE   RBC / HPF 0-5 0 - 5 RBC/hpf   WBC, UA 11-20 0 - 5 WBC/hpf   Bacteria, UA MANY (A) NONE SEEN   Squamous Epithelial / LPF 0-5 0 - 5   Hyaline Casts, UA PRESENT     Comment: Performed at [facility], [address], [place], [place] [address]  Lactic acid, plasma     Status: Abnormal   Collection Time: 10/04/21  3:13 AM  Result Value Ref Range   Lactic Acid, Venous >9.0 (HH) 0.5 - 1.9 mmol/L    Comment: CRITICAL VALUE NOTED.  VALUE IS CONSISTENT WITH PREVIOUSLY REPORTED AND  CALLED VALUE. Performed at [facility], [place] [address], [place], [place] [address]   Protime-INR     Status: Abnormal   Collection Time: 10/04/21  3:30 AM  Result Value Ref Range   Prothrombin Time 20.3 (H) 11.4 - 15.2 seconds   INR 1.7 (H) 0.8 - 1.2    Comment: (NOTE) INR goal varies based on device and disease states. Performed at [facility], [place] [address], [place], [place] [address]   Comprehensive metabolic panel     Status: Abnormal   Collection Time: 10/04/21  3:30 AM  Result Value Ref Range   Sodium 134 (L) 135 - 145 mmol/L   Potassium 3.5 3.5 - 5.1 mmol/L    Comment: SLIGHT HEMOLYSIS   Chloride 103 98 - 111 mmol/L   CO2 10 (L) 22 - 32 mmol/L   Glucose, Bld 106 (H) 70 - 99 mg/dL    Comment: Glucose reference range applies only to samples taken after fasting for at least 8 hours.   BUN 10 6 - 20 mg/dL   Creatinine, Ser 0.92 0.44 - 1.00 mg/dL   Calcium 8.1 (L) 8.9 - 10.3 mg/dL   Total Protein 6.2 (L) 6.5 - 8.1 g/dL   Albumin 2.3 (L) 3.5 - 5.0 g/dL   AST 301 (H) 15 - 41 U/L   ALT 69 (H) 0 - 44 U/L   Alkaline Phosphatase 151 (H) 38 - 126 U/L   Total Bilirubin 30.9 (HH) 0.3 - 1.2 mg/dL    Comment: RESULTS CONFIRMED BY MANUAL DILUTION CRITICAL VALUE NOTED.  VALUE IS CONSISTENT WITH PREVIOUSLY REPORTED AND CALLED VALUE.    GFR, Estimated >60 >60 mL/min    Comment: (NOTE) Calculated using the CKD-EPI Creatinine Equation (2021)    Anion gap 21 (H) 5 - 15    Comment: REPEATED TO VERIFY Performed at [facility], [address] [place],  [address]   CBC     Status: Abnormal   Collection Time: 10/04/21  3:30 AM  Result Value Ref Range   WBC 16.2 (H) 4.0 - 10.5 K/uL   RBC 2.76 (L) 3.87 - 5.11 MIL/uL   Hemoglobin 9.2 (L) 12.0 - 15.0 g/dL   HCT 27.3 (L) 36.0 - 46.0 %   MCV  98.9 80.0 - 100.0 fL   MCH 33.3 26.0 - 34.0 pg   MCHC 33.7 30.0 - 36.0 g/dL   RDW 32.5 (H) 11.5 - 15.5 %   Platelets 172 150 - 400 K/uL   nRBC 0.8 (H) 0.0 - 0.2 %     Comment: Performed at [facility] [address] [place], [place] [address]  Lactic acid, plasma     Status: Abnormal   Collection Time: 10/04/21  7:00 AM  Result Value Ref Range   Lactic Acid, Venous 8.7 (HH) 0.5 - 1.9 mmol/L    Comment: CRITICAL VALUE NOTED.  VALUE IS CONSISTENT WITH PREVIOUSLY REPORTED AND CALLED VALUE. Performed at [facility], [place] [address] [place], [place] [address]   Lactic acid, plasma     Status: Abnormal   Collection Time: 10/04/21  9:48 AM  Result Value Ref Range   Lactic Acid, Venous 8.4 (HH) 0.5 - 1.9 mmol/L    Comment: CRITICAL VALUE NOTED.  VALUE IS CONSISTENT WITH PREVIOUSLY REPORTED AND CALLED VALUE. Performed at [facility], [place] [address], [place], [place] [address]     US Abdomen Complete  Result Date: 09/09/2021 CLINICAL DATA:  Acute on chronic liver disease with jaundice, earlier study today showing gallbladder thickening and positive sonographic Murphy's sign. EXAM: ABDOMEN ULTRASOUND COMPLETE COMPARISON:  The CT with IV contrast 07/25/2021. FINDINGS: Gallbladder: There is hypoechoic layering sludge without visible shadowing stones. There is trace pericholecystic fluid. The free wall is thickened up to 8.1 mm and there is positive right upper abdominal tenderness concerning for acute cholecystitis. The wall thickening and positive right upper abdominal tenderness could alternatively have a basis in hepatic dysfunction and/or hepatitis or other adjacent inflammatory process. Wall thickening was not seen on the 07/25/2021 CT. Common bile duct: Diameter: Prominent measuring 8.1 mm, but previously measured 7 mm. No intrahepatic biliary dilatation. Liver: No focal lesion identified through the patient's steatosis. The liver is diffusely attenuating consistent with fatty replacement, with moderate to severe fatty replacement noted on the CT. The liver length was not measured today but was previously 17 cm. Portal vein is patent on color  Doppler imaging with reversal of flow. It is otherwise not well seen today but previously measured prominent at 14 mm. IVC: No abnormality visualized. Pancreas: Body of the pancreas is visible and unremarkable. The head, neck and tail segments are obscured by bowel gas. Spleen: Size and appearance within normal limits. Right Kidney: Length: 11.4 cm. Cortical echogenicity and thickness within normal limits. No hydronephrosis or stone visualized. Again noted is a 3.5 cm mass posteriorly with uniform increased echogenicity consistent with the angiomyolipoma noted on the CT, lower pole. No other mass is seen. There was a 3 mm lower pole caliceal stone on the CT but it is not visible sonographically , possibly obscured by bowel gas shadowing or passed in the interval. Left Kidney: Length: 9.5 cm. Echogenicity within normal limits. No mass, stone or hydronephrosis visualized. Abdominal aorta: No aneurysm visualized. Other findings: No ascites. IMPRESSION: 1. Gallbladder sludge without shadowing stones, with wall thickening, pericholecystic fluid and positive sonographic Murphy sign. Findings suspicious for cholecystitis. With hepatic dysfunction, alternative etiology could be congestive or reactive thickening such as with hepatitis, or adjacent inflammatory process. 2. The pancreas mostly obscured but normal where visible. 3. Prominent common bile duct.  No intrahepatic biliary prominence. 4. Diffuse liver attenuation consistent with the fatty replacement noted on the CT. 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 6.  Reversed flow in the hepatic portal vein. The portal vein was otherwise not well enough visualized to measure its diameter but it was previously slightly prominent. Electronically Signed   By: Telford Nab M.D.   On: 10/05/2021 22:45   DG CHEST PORT 1 VIEW  Result Date: 09/21/2021 CLINICAL DATA:  Jaundice, abdominal pain and sepsis. EXAM: PORTABLE CHEST 1 VIEW COMPARISON:  PA Lat 05/14/2006. FINDINGS: The  heart size and mediastinal contours are within normal limits. Both lungs are clear apart from linear scarring or atelectasis laterally in the left base. The visualized skeletal structures are unremarkable. IMPRESSION: No evidence of acute chest disease. Electronically Signed   By: Telford Nab M.D.   On: 10/02/2021 21:03   US LIVER DOPPLER  Result Date: 09/28/2021 CLINICAL DATA:  Jaundice, acute on chronic liver disease EXAM: DUPLEX ULTRASOUND OF LIVER TECHNIQUE: Color and duplex Doppler ultrasound was performed to evaluate the hepatic in-flow and out-flow vessels. COMPARISON:  CT 07/25/2021 FINDINGS: Liver: Diffusely increased parenchymal echogenicity with extremely poor acoustic through transmission in keeping with changes of severe hepatic steatosis. Assessment of the hepatic parenchyma is resultantly markedly limited. Limited images of the hepatic contour demonstrate no obvious nodularity. No definite focal intrahepatic mass identified. Main Portal Vein size: 12 mm cm Portal Vein Velocities Main Prox:  -63 cm/sec Main Mid: -55 cm/sec Main Dist:  -79 cm/sec Right: -38 cm/sec Left: 35 cm/sec Hepatic Vein Velocities Right:  17 cm/sec Middle:  30 cm/sec Left:  24 cm/sec IVC: Patent Hepatic Artery Velocity:  67 cm/sec Splenic Vein Velocity:  14 cm/sec Spleen: 6.9 cm x 10.4 cm x 8.3 cm with a total volume of 313 cm^3 (411 cm^3 is upper limit normal) Portal Vein Occlusion/Thrombus: No Splenic Vein Occlusion/Thrombus: No Ascites: None Varices: None Superior mesenteric vein appears patent centrally. IMPRESSION: Markedly limited examination secondary to severe hepatic steatosis and resultant poor acoustic through transmission. Hepatofugal flow within the right portal vein and main portal vein. Preserved antegrade flow within the left portal vein. Splenic vein and superior mesenteric vein appear patent. Electronically Signed   By: Fidela Salisbury M.D.   On: 09/21/2021 22:42   US Abdomen Limited RUQ  (LIVER/GB)  Result Date: 09/13/2021 CLINICAL DATA:  RIGHT-sided abdominal pain.  Jaundice EXAM: ULTRASOUND ABDOMEN LIMITED RIGHT UPPER QUADRANT COMPARISON:  CT 07/25/2021, ultrasound 07/24/2021 FINDINGS: Gallbladder: Sludge within the gallbladder. The gallbladder wall is thickened. Positive sonographic Murphy's sign. Common bile duct: Diameter: Normal at 4 mm Liver: Increased echogenicity. Portal vein is patent on color Doppler imaging with normal direction of blood flow towards the liver. Other: None. IMPRESSION: Pericholecystic fluid, gallbladder wall thickening and positive sonographic Murphy's sign are concerning for acute cholecystitis. Electronically Signed   By: Suzy Bouchard M.D.   On: 10/02/2021 18:07    ROS - all of the below systems have been reviewed with the patient and positives are indicated with bold text General: chills, fever or night sweats Eyes: blurry vision or double vision ENT: epistaxis or sore throat Allergy/Immunology: itchy/watery eyes or nasal congestion Hematologic/Lymphatic: bleeding problems, blood clots or swollen lymph nodes Endocrine: temperature intolerance or unexpected weight changes Breast: new or changing breast lumps or nipple discharge Resp: cough, shortness of breath, or wheezing CV: chest pain or dyspnea on exertion GI: as per HPI GU: dysuria, trouble voiding, or hematuria MSK: joint pain or joint stiffness Neuro: TIA or stroke symptoms Derm: pruritus and skin lesion changes Psych: anxiety and depression  PE Blood pressure 136/64, pulse 96, temperature 97.7 F (  36.5 C), temperature source Oral, resp. rate 16, height $RemoveBe'5\' 6"'[id number]$  (1.676 m), weight 63.7 kg, last menstrual period 06/11/2018, SpO2 97 %. Constitutional: NAD; conversant; no deformities Eyes: Moist conjunctiva; no lid lag; anicteric; PERRL Neck: Trachea midline; no thyromegaly Lungs: Normal respiratory effort; no tactile fremitus CV: RRR; no palpable thrills; no pitting edema GI: Abd soft,  mildly ttp in RUQ; not significantly distended; no rebound nor guarding; difficult to palpate liver edges MSK: Normal range of motion of extremities; no clubbing/cyanosis Psychiatric: Appropriate affect; alert and oriented x3 Lymphatic: No palpable cervical or axillary lymphadenopathy  Results for orders placed or performed during the hospital encounter of 09/21/2021 (from the past 48 hour(s))  Lipase, blood     Status: Abnormal   Collection Time: 09/10/2021  4:35 PM  Result Value Ref Range   Lipase 93 (H) 11 - 51 U/L    Comment: Performed at [facility], [place] [address] [place], [place] [address]  Comprehensive metabolic panel     Status: Abnormal   Collection Time: 09/18/2021  4:35 PM  Result Value Ref Range   Sodium 132 (L) 135 - 145 mmol/L   Potassium 2.4 (LL) 3.5 - 5.1 mmol/L    Comment: CRITICAL RESULT CALLED TO, READ BACK BY AND VERIFIED WITH: C.COBB,RN 09/11/2021 AT 1756 A.HUGHES    Chloride 95 (L) 98 - 111 mmol/L   CO2 17 (L) 22 - 32 mmol/L   Glucose, Bld 164 (H) 70 - 99 mg/dL    Comment: Glucose reference range applies only to samples taken after fasting for at least 8 hours.   BUN 10 6 - 20 mg/dL   Creatinine, Ser 1.05 (H) 0.44 - 1.00 mg/dL   Calcium 8.9 8.9 - 10.3 mg/dL   Total Protein 7.2 6.5 - 8.1 g/dL   Albumin 2.7 (L) 3.5 - 5.0 g/dL   AST 317 (H) 15 - 41 U/L    Comment: RESULTS CONFIRMED BY MANUAL DILUTION   ALT 80 (H) 0 - 44 U/L   Alkaline Phosphatase 175 (H) 38 - 126 U/L   Total Bilirubin 33.5 (HH) 0.3 - 1.2 mg/dL    Comment: CRITICAL RESULT CALLED TO, READ BACK BY AND VERIFIED WITH: C.COBB,RN 09/13/2021 AT 1905 A.HUGHES    GFR, Estimated >60 >60 mL/min    Comment: (NOTE) Calculated using the CKD-EPI Creatinine Equation (2021)    Anion gap 20 (H) 5 - 15    Comment: Performed at [facility] [address] [place], [place] [address]  CBC     Status: Abnormal   Collection Time: 09/16/2021  4:35 PM  Result Value Ref Range   WBC 17.2 (H) 4.0 - 10.5  K/uL   RBC 3.25 (L) 3.87 - 5.11 MIL/uL   Hemoglobin 10.8 (L) 12.0 - 15.0 g/dL   HCT 31.6 (L) 36.0 - 46.0 %   MCV 97.2 80.0 - 100.0 fL   MCH 33.2 26.0 - 34.0 pg   MCHC 34.2 30.0 - 36.0 g/dL   RDW 32.2 (H) 11.5 - 15.5 %   Platelets 203 150 - 400 K/uL   nRBC 0.7 (H) 0.0 - 0.2 %    Comment: Performed at [facility] [address], [place], [place] [address]  Resp Panel by RT-PCR (Flu A&B, Covid) Nasopharyngeal Swab     Status: None   Collection Time: 09/09/2021  4:39 PM   Specimen: Nasopharyngeal Swab; Nasopharyngeal(NP) swabs in vial transport medium  Result Value Ref Range   SARS Coronavirus 2  by RT PCR NEGATIVE NEGATIVE    Comment: (NOTE) SARS-CoV-2 target nucleic acids are NOT DETECTED.  The SARS-CoV-2 RNA is generally detectable in upper respiratory specimens during the acute phase of infection. The lowest concentration of SARS-CoV-2 viral copies this assay can detect is 138 copies/mL. A negative result does not preclude SARS-Cov-2 infection and should not be used as the sole basis for treatment or other patient management decisions. A negative result may occur with  improper specimen collection/handling, submission of specimen other than nasopharyngeal swab, presence of viral mutation(s) within the areas targeted by this assay, and inadequate number of viral copies(<138 copies/mL). A negative result must be combined with clinical observations, patient history, and epidemiological information. The expected result is Negative.  Fact Sheet for Patients:  EntrepreneurPulse.com.au  Fact Sheet for Healthcare Providers:  IncredibleEmployment.be  This test is no t yet approved or cleared by the [place] FDA and  has been authorized for detection and/or diagnosis of SARS-CoV-2 by FDA under an Emergency Use Authorization (EUA). This EUA will remain  in effect (meaning this test can be used) for the duration of the COVID-19 declaration  under Section 564(b)(1) of the Act, 21 U.S.C.section 360bbb-3(b)(1), unless the authorization is terminated  or revoked sooner.       Influenza A by PCR NEGATIVE NEGATIVE   Influenza B by PCR NEGATIVE NEGATIVE    Comment: (NOTE) The Xpert Xpress SARS-CoV-2/FLU/RSV plus assay is intended as an aid in the diagnosis of influenza from Nasopharyngeal swab specimens and should not be used as a sole basis for treatment. Nasal washings and aspirates are unacceptable for Xpert Xpress SARS-CoV-2/FLU/RSV testing.  Fact Sheet for Patients: EntrepreneurPulse.com.au  Fact Sheet for Healthcare Providers: IncredibleEmployment.be  This test is not yet approved or cleared by the [place] FDA and has been authorized for detection and/or diagnosis of SARS-CoV-2 by FDA under an Emergency Use Authorization (EUA). This EUA will remain in effect (meaning this test can be used) for the duration of the COVID-19 declaration under Section 564(b)(1) of the Act, 21 U.S.C. section 360bbb-3(b)(1), unless the authorization is terminated or revoked.  Performed at [facility], [place] [address], [place], [place] [address]   I-Stat beta hCG blood, ED     Status: None   Collection Time: 09/30/2021  4:41 PM  Result Value Ref Range   I-stat hCG, quantitative <5.0 <5 mIU/mL   Comment 3            Comment:   GEST. AGE      CONC.  (mIU/mL)   <=1 WEEK        5 - 50     2 WEEKS       50 - 500     3 WEEKS       100 - 10,000     4 WEEKS     1,000 - 30,000        FEMALE AND NON-PREGNANT FEMALE:     LESS THAN 5 mIU/mL   Ethanol     Status: Abnormal   Collection Time: 09/14/2021  5:05 PM  Result Value Ref Range   Alcohol, Ethyl (B) 216 (H) <10 mg/dL    Comment: (NOTE) Lowest detectable limit for serum alcohol is 10 mg/dL.  For medical purposes only. Performed at [facility], [place] [address], [place], [place] [address]   Phosphorus     Status: None   Collection  Time: 10/02/2021  5:05 PM  Result Value Ref Range  Phosphorus 3.2 2.5 - 4.6 mg/dL    Comment: Performed at [facility], [place] [address], [place], [place] [address]  Magnesium     Status: None   Collection Time: 09/12/2021  5:05 PM  Result Value Ref Range   Magnesium 2.4 1.7 - 2.4 mg/dL    Comment: Performed at [facility], [place] [address] [place], [place] [address]  Ammonia     Status: Abnormal   Collection Time: 09/09/2021  5:13 PM  Result Value Ref Range   Ammonia 106 (H) 9 - 35 umol/L    Comment: Performed at [facility], [place] [address] [place], [place] [address]  Lactic acid, plasma     Status: Abnormal   Collection Time: 09/27/2021  6:03 PM  Result Value Ref Range   Lactic Acid, Venous >9.0 (HH) 0.5 - 1.9 mmol/L    Comment: CRITICAL RESULT CALLED TO, READ BACK BY AND VERIFIED WITH: C.COBB,RN 09/25/2021 AT 1849 A.HUGHES Performed at [facility], [place] [address], [place], [place] [address]   Protime-INR     Status: Abnormal   Collection Time: 09/18/2021  6:08 PM  Result Value Ref Range   Prothrombin Time 18.7 (H) 11.4 - 15.2 seconds   INR 1.6 (H) 0.8 - 1.2    Comment: (NOTE) INR goal varies based on device and disease states. Performed at [facility], [place] [address] [place], [place] [address]   Culture, blood (routine x 2)     Status: None (Preliminary result)   Collection Time: 09/17/2021  8:35 PM   Specimen: BLOOD RIGHT HAND  Result Value Ref Range   Specimen Description BLOOD RIGHT HAND    Special Requests      AEROBIC BOTTLE ONLY Blood Culture results may not be optimal due to an inadequate volume of blood received in culture bottles   Culture      NO GROWTH < 12 HOURS Performed at [facility] [address], [place], [place] [address]    Report Status PENDING   Lactic acid, plasma     Status: Abnormal   Collection Time: 10/06/2021 10:54 PM  Result Value Ref Range   Lactic Acid, Venous 8.1 (HH) 0.5 - 1.9 mmol/L    Comment: CRITICAL  VALUE NOTED.  VALUE IS CONSISTENT WITH PREVIOUSLY REPORTED AND CALLED VALUE. Performed at [facility], [place] [address], [place], [place] [address]   HIV Antibody (routine testing w rflx)     Status: None   Collection Time: 10/04/21 12:21 AM  Result Value Ref Range   HIV Screen 4th Generation wRfx Non Reactive Non Reactive    Comment: Performed at [facility], [place] [address], [place], [place] [address]  TSH     Status: Abnormal   Collection Time: 10/04/21 12:21 AM  Result Value Ref Range   TSH 5.612 (H) 0.350 - 4.500 uIU/mL    Comment: Performed by a 3rd Generation assay with a functional sensitivity of <=0.01 uIU/mL. Performed at [facility], [place] [address], [place], [place] [address]   Magnesium     Status: None   Collection Time: 10/04/21 12:21 AM  Result Value Ref Range   Magnesium 2.1 1.7 - 2.4 mg/dL    Comment: Performed at [facility] [address] [place], [place] [address]  Hepatitis panel, acute     Status: None   Collection Time: 10/04/21 12:21 AM  Result Value Ref Range  Hepatitis B Surface Ag NON REACTIVE NON REACTIVE   HCV Ab NON REACTIVE NON REACTIVE    Comment: (NOTE) Nonreactive HCV antibody screen is consistent with no HCV infections,  unless recent infection is suspected or other evidence exists to indicate HCV infection.     Hep A IgM NON REACTIVE NON REACTIVE   Hep B C IgM NON REACTIVE NON REACTIVE    Comment: Performed at [facility], [place] [address] [place], [place] [address]  Lactic acid, plasma     Status: Abnormal   Collection Time: 10/04/21 12:21 AM  Result Value Ref Range   Lactic Acid, Venous 8.3 (HH) 0.5 - 1.9 mmol/L    Comment: CRITICAL VALUE NOTED.  VALUE IS CONSISTENT WITH PREVIOUSLY REPORTED AND CALLED VALUE. Performed at [facility], [place] [address] [place], [place] [address]   Urinalysis, Routine w reflex microscopic Urine, Clean Catch     Status: Abnormal   Collection Time: 10/04/21  2:34 AM   Result Value Ref Range   Color, Urine AMBER (A) YELLOW    Comment: BIOCHEMICALS MAY BE AFFECTED BY COLOR   APPearance CLOUDY (A) CLEAR   Specific Gravity, Urine 1.012 1.005 - 1.030   pH 5.0 5.0 - 8.0   Glucose, UA NEGATIVE NEGATIVE mg/dL   Hgb urine dipstick SMALL (A) NEGATIVE   Bilirubin Urine MODERATE (A) NEGATIVE   Ketones, ur NEGATIVE NEGATIVE mg/dL   Protein, ur 30 (A) NEGATIVE mg/dL   Nitrite NEGATIVE NEGATIVE   Leukocytes,Ua TRACE (A) NEGATIVE   RBC / HPF 0-5 0 - 5 RBC/hpf   WBC, UA 11-20 0 - 5 WBC/hpf   Bacteria, UA MANY (A) NONE SEEN   Squamous Epithelial / LPF 0-5 0 - 5   Hyaline Casts, UA PRESENT     Comment: Performed at [facility], [address] [place], [place] [address]  Lactic acid, plasma     Status: Abnormal   Collection Time: 10/04/21  3:13 AM  Result Value Ref Range   Lactic Acid, Venous >9.0 (HH) 0.5 - 1.9 mmol/L    Comment: CRITICAL VALUE NOTED.  VALUE IS CONSISTENT WITH PREVIOUSLY REPORTED AND CALLED VALUE. Performed at [facility], [place] [address] [place], [place] [address]   Protime-INR     Status: Abnormal   Collection Time: 10/04/21  3:30 AM  Result Value Ref Range   Prothrombin Time 20.3 (H) 11.4 - 15.2 seconds   INR 1.7 (H) 0.8 - 1.2    Comment: (NOTE) INR goal varies based on device and disease states. Performed at [facility], [place] [address], [place], [place] [address]   Comprehensive metabolic panel     Status: Abnormal   Collection Time: 10/04/21  3:30 AM  Result Value Ref Range   Sodium 134 (L) 135 - 145 mmol/L   Potassium 3.5 3.5 - 5.1 mmol/L    Comment: SLIGHT HEMOLYSIS   Chloride 103 98 - 111 mmol/L   CO2 10 (L) 22 - 32 mmol/L   Glucose, Bld 106 (H) 70 - 99 mg/dL    Comment: Glucose reference range applies only to samples taken after fasting for at least 8 hours.   BUN 10 6 - 20 mg/dL   Creatinine, Ser 0.92 0.44 - 1.00 mg/dL   Calcium 8.1 (L) 8.9 - 10.3 mg/dL   Total Protein 6.2 (L) 6.5 - 8.1 g/dL    Albumin 2.3 (L) 3.5 - 5.0 g/dL   AST 301 (H) 15 -  41 U/L   ALT 69 (H) 0 - 44 U/L   Alkaline Phosphatase 151 (H) 38 - 126 U/L   Total Bilirubin 30.9 (HH) 0.3 - 1.2 mg/dL    Comment: RESULTS CONFIRMED BY MANUAL DILUTION CRITICAL VALUE NOTED.  VALUE IS CONSISTENT WITH PREVIOUSLY REPORTED AND CALLED VALUE.    GFR, Estimated >60 >60 mL/min    Comment: (NOTE) Calculated using the CKD-EPI Creatinine Equation (2021)    Anion gap 21 (H) 5 - 15    Comment: REPEATED TO VERIFY Performed at [facility], [address], [place], [place] [address]   CBC     Status: Abnormal   Collection Time: 10/04/21  3:30 AM  Result Value Ref Range   WBC 16.2 (H) 4.0 - 10.5 K/uL   RBC 2.76 (L) 3.87 - 5.11 MIL/uL   Hemoglobin 9.2 (L) 12.0 - 15.0 g/dL   HCT 27.3 (L) 36.0 - 46.0 %   MCV 98.9 80.0 - 100.0 fL   MCH 33.3 26.0 - 34.0 pg   MCHC 33.7 30.0 - 36.0 g/dL   RDW 32.5 (H) 11.5 - 15.5 %   Platelets 172 150 - 400 K/uL   nRBC 0.8 (H) 0.0 - 0.2 %    Comment: Performed at [facility] [address], [name], [place] [address]  Lactic acid, plasma     Status: Abnormal   Collection Time: 10/04/21  7:00 AM  Result Value Ref Range   Lactic Acid, Venous 8.7 (HH) 0.5 - 1.9 mmol/L    Comment: CRITICAL VALUE NOTED.  VALUE IS CONSISTENT WITH PREVIOUSLY REPORTED AND CALLED VALUE. Performed at [facility], [place] [address] [place], [place] [address]   Lactic acid, plasma     Status: Abnormal   Collection Time: 10/04/21  9:48 AM  Result Value Ref Range   Lactic Acid, Venous 8.4 (HH) 0.5 - 1.9 mmol/L    Comment: CRITICAL VALUE NOTED.  VALUE IS CONSISTENT WITH PREVIOUSLY REPORTED AND CALLED VALUE. Performed at [facility], [place] [address], [place], [place] [address]     US Abdomen Complete  Result Date: 09/23/2021 CLINICAL DATA:  Acute on chronic liver disease with jaundice, earlier study today showing gallbladder thickening and positive sonographic Murphy's sign. EXAM: ABDOMEN ULTRASOUND  COMPLETE COMPARISON:  The CT with IV contrast 07/25/2021. FINDINGS: Gallbladder: There is hypoechoic layering sludge without visible shadowing stones. There is trace pericholecystic fluid. The free wall is thickened up to 8.1 mm and there is positive right upper abdominal tenderness concerning for acute cholecystitis. The wall thickening and positive right upper abdominal tenderness could alternatively have a basis in hepatic dysfunction and/or hepatitis or other adjacent inflammatory process. Wall thickening was not seen on the 07/25/2021 CT. Common bile duct: Diameter: Prominent measuring 8.1 mm, but previously measured 7 mm. No intrahepatic biliary dilatation. Liver: No focal lesion identified through the patient's steatosis. The liver is diffusely attenuating consistent with fatty replacement, with moderate to severe fatty replacement noted on the CT. The liver length was not measured today but was previously 17 cm. Portal vein is patent on color Doppler imaging with reversal of flow. It is otherwise not well seen today but previously measured prominent at 14 mm. IVC: No abnormality visualized. Pancreas: Body of the pancreas is visible and unremarkable. The head, neck and tail segments are obscured by bowel gas. Spleen: Size and appearance within normal limits. Right Kidney: Length: 11.4 cm. Cortical echogenicity and thickness within normal limits. No hydronephrosis  or stone visualized. Again noted is a 3.5 cm mass posteriorly with uniform increased echogenicity consistent with the angiomyolipoma noted on the CT, lower pole. No other mass is seen. There was a 3 mm lower pole caliceal stone on the CT but it is not visible sonographically , possibly obscured by bowel gas shadowing or passed in the interval. Left Kidney: Length: 9.5 cm. Echogenicity within normal limits. No mass, stone or hydronephrosis visualized. Abdominal aorta: No aneurysm visualized. Other findings: No ascites. IMPRESSION: 1. Gallbladder  sludge without shadowing stones, with wall thickening, pericholecystic fluid and positive sonographic Murphy sign. Findings suspicious for cholecystitis. With hepatic dysfunction, alternative etiology could be congestive or reactive thickening such as with hepatitis, or adjacent inflammatory process. 2. The pancreas mostly obscured but normal where visible. 3. Prominent common bile duct.  No intrahepatic biliary prominence. 4. Diffuse liver attenuation consistent with the fatty replacement noted on the CT. 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 6. Reversed flow in the hepatic portal vein. The portal vein was otherwise not well enough visualized to measure its diameter but it was previously slightly prominent. Electronically Signed   By: Telford Nab M.D.   On: 09/20/2021 22:45   DG CHEST PORT 1 VIEW  Result Date: 09/09/2021 CLINICAL DATA:  Jaundice, abdominal pain and sepsis. EXAM: PORTABLE CHEST 1 VIEW COMPARISON:  PA Lat 05/14/2006. FINDINGS: The heart size and mediastinal contours are within normal limits. Both lungs are clear apart from linear scarring or atelectasis laterally in the left base. The visualized skeletal structures are unremarkable. IMPRESSION: No evidence of acute chest disease. Electronically Signed   By: Telford Nab M.D.   On: 10/02/2021 21:03   US LIVER DOPPLER  Result Date: 09/13/2021 CLINICAL DATA:  Jaundice, acute on chronic liver disease EXAM: DUPLEX ULTRASOUND OF LIVER TECHNIQUE: Color and duplex Doppler ultrasound was performed to evaluate the hepatic in-flow and out-flow vessels. COMPARISON:  CT 07/25/2021 FINDINGS: Liver: Diffusely increased parenchymal echogenicity with extremely poor acoustic through transmission in keeping with changes of severe hepatic steatosis. Assessment of the hepatic parenchyma is resultantly markedly limited. Limited images of the hepatic contour demonstrate no obvious nodularity. No definite focal intrahepatic mass identified. Main Portal Vein  size: 12 mm cm Portal Vein Velocities Main Prox:  -63 cm/sec Main Mid: -55 cm/sec Main Dist:  -79 cm/sec Right: -38 cm/sec Left: 35 cm/sec Hepatic Vein Velocities Right:  17 cm/sec Middle:  30 cm/sec Left:  24 cm/sec IVC: Patent Hepatic Artery Velocity:  67 cm/sec Splenic Vein Velocity:  14 cm/sec Spleen: 6.9 cm x 10.4 cm x 8.3 cm with a total volume of 313 cm^3 (411 cm^3 is upper limit normal) Portal Vein Occlusion/Thrombus: No Splenic Vein Occlusion/Thrombus: No Ascites: None Varices: None Superior mesenteric vein appears patent centrally. IMPRESSION: Markedly limited examination secondary to severe hepatic steatosis and resultant poor acoustic through transmission. Hepatofugal flow within the right portal vein and main portal vein. Preserved antegrade flow within the left portal vein. Splenic vein and superior mesenteric vein appear patent. Electronically Signed   By: Fidela Salisbury M.D.   On: 09/25/2021 22:42   US Abdomen Limited RUQ (LIVER/GB)  Result Date: 10/02/2021 CLINICAL DATA:  RIGHT-sided abdominal pain.  Jaundice EXAM: ULTRASOUND ABDOMEN LIMITED RIGHT UPPER QUADRANT COMPARISON:  CT 07/25/2021, ultrasound 07/24/2021 FINDINGS: Gallbladder: Sludge within the gallbladder. The gallbladder wall is thickened. Positive sonographic Murphy's sign. Common bile duct: Diameter: Normal at 4 mm Liver: Increased echogenicity. Portal vein is patent on color Doppler imaging with normal direction of  blood flow towards the liver. Other: None. IMPRESSION: Pericholecystic fluid, gallbladder wall thickening and positive sonographic Murphy's sign are concerning for acute cholecystitis. Electronically Signed   By: Suzy Bouchard M.D.   On: 10/02/2021 18:07    I have personally reviewed the relevant RUQ [place] 10/02/2021, Abdominal US 09/17/2021, CT A/P 07/25/21, HIDA scan 11/18/20; labs from 2/25 & 2/26  A/P: Judy Meyer is an 61 y.o. female with HTN, hypothyroidism, H63D genetic mutation for hereditary hemochromatosis,  nephrolithiasis, alcohol abuse and liver disease, alcohol hepatitis  EtOH on admission noted 216  -Agree with empiric coverage for cholecystitis with IV Zosyn as per pharmacy -Certainly with her what appears to be acute liver failure, would be surprised if she also has cholecystitis.  We can obtain a HIDA scan for further evaluation, however, this may be limited due to hepatic dysfunction.  We will have to see if the tracer is taken up/excreted by the hepatocytes. -Would not recommend surgery at present with her portal hypertension and current decompensated state.  Additional recommendations pending further evaluations. -Noted GI is seeing the patient concurrently.  I spent a total of 80 minutes in both face-to-face and non-face-to-face activities, excluding procedures performed, for this visit on the date of this encounter.  High medical decision making  Nadeen Landau, MD [facility]

## 2021-10-04 NOTE — Progress Notes (Signed)
°   10/04/21 0729  CIWA-Ar  BP 136/64  Pulse Rate 96  Nausea and Vomiting 4  Tactile Disturbances 3  Tremor 4  Auditory Disturbances 0  Paroxysmal Sweats 1  Visual Disturbances 0  Anxiety 1  Headache, Fullness in Head 2  Agitation 1  Orientation and Clouding of Sensorium 3  CIWA-Ar Total 19    MD notified.  No new orders given.

## 2021-10-04 NOTE — Progress Notes (Signed)
Pharmacy Antibiotic Note  Judy Meyer is a 61 y.o. female admitted on 5/94/5859 with alcoholic hepatitis and now concern for intra-abdominal infection. Ultrasound of abdomen concern for cholecystitis which general surgery has been consulted for evaluation. WBC 16.2 and afebrile. Scr 0.92 which appears to be at baseline. Pharmacy has been consulted for piperacillin-tazobactam dosing.  Plan: Start piperacillin-tazobactam 3.375g q8h (4h infusion) F/u clinical status, renal function, and length of therapy  Height: 5\' 6"  (167.6 cm) Weight: 63.7 kg (140 lb 6.9 oz) IBW/kg (Calculated) : 59.3  Temp (24hrs), Avg:98.1 F (36.7 C), Min:97.5 F (36.4 C), Max:98.7 F (37.1 C)  Recent Labs  Lab 10/05/2021 1635 10/06/2021 1803 09/20/2021 2254 10/04/21 0021 10/04/21 0313 10/04/21 0330 10/04/21 0700 10/04/21 0948  WBC 17.2*  --   --   --   --  16.2*  --   --   CREATININE 1.05*  --   --   --   --  0.92  --   --   LATICACIDVEN  --    < > 8.1* 8.3* >9.0*  --  8.7* 8.4*   < > = values in this interval not displayed.    Estimated Creatinine Clearance: 61.6 mL/min (by C-G formula based on SCr of 0.92 mg/dL).    Allergies  Allergen Reactions   Compazine [Prochlorperazine Edisylate] Anaphylaxis   Haloperidol Other (See Comments)    Other reaction(s): Seizure (finding) Other reaction(s): Seizure (finding)    Periactin [Cyproheptadine] Anaphylaxis   Cyclobenzaprine Other (See Comments)    Palpations    Haloperidol And Related Other (See Comments)    Caused seizure   Metoclopramide Other (See Comments)    Caused seizure    Antimicrobials this admission: Pip/tazo 2/26>   Microbiology results: 2/25 Bcx - NGTD 2/25 Ucx - pending   Thank you for allowing pharmacy to be a part of this patients care.  Levonne Spiller 10/04/2021 11:38 AM

## 2021-10-05 DIAGNOSIS — E872 Acidosis, unspecified: Secondary | ICD-10-CM | POA: Diagnosis not present

## 2021-10-05 DIAGNOSIS — K7201 Acute and subacute hepatic failure with coma: Secondary | ICD-10-CM

## 2021-10-05 DIAGNOSIS — K7010 Alcoholic hepatitis without ascites: Secondary | ICD-10-CM | POA: Diagnosis not present

## 2021-10-05 DIAGNOSIS — K7682 Hepatic encephalopathy: Secondary | ICD-10-CM | POA: Diagnosis not present

## 2021-10-05 LAB — COMPREHENSIVE METABOLIC PANEL
ALT: 83 U/L — ABNORMAL HIGH (ref 0–44)
ALT: 66 U/L — ABNORMAL HIGH (ref 0–44)
ALT: 85 U/L — ABNORMAL HIGH (ref 0–44)
ALT: 83 U/L — ABNORMAL HIGH (ref 0–44)
AST: 354 U/L — ABNORMAL HIGH (ref 15–41)
AST: 301 U/L — ABNORMAL HIGH (ref 15–41)
AST: 391 U/L — ABNORMAL HIGH (ref 15–41)
AST: 397 U/L — ABNORMAL HIGH (ref 15–41)
Albumin: 2.3 g/dL — ABNORMAL LOW (ref 3.5–5.0)
Albumin: 2.2 g/dL — ABNORMAL LOW (ref 3.5–5.0)
Albumin: 2.5 g/dL — ABNORMAL LOW (ref 3.5–5.0)
Albumin: 2.4 g/dL — ABNORMAL LOW (ref 3.5–5.0)
Alkaline Phosphatase: 154 U/L — ABNORMAL HIGH (ref 38–126)
Alkaline Phosphatase: 178 U/L — ABNORMAL HIGH (ref 38–126)
Alkaline Phosphatase: 140 U/L — ABNORMAL HIGH (ref 38–126)
Alkaline Phosphatase: 132 U/L — ABNORMAL HIGH (ref 38–126)
Anion gap: 16 — ABNORMAL HIGH (ref 5–15)
Anion gap: 16 — ABNORMAL HIGH (ref 5–15)
Anion gap: 14 (ref 5–15)
Anion gap: 12 (ref 5–15)
BUN: 6 mg/dL (ref 6–20)
BUN: 9 mg/dL (ref 6–20)
BUN: 10 mg/dL (ref 6–20)
BUN: 11 mg/dL (ref 6–20)
CO2: 19 mmol/L — ABNORMAL LOW (ref 22–32)
CO2: 16 mmol/L — ABNORMAL LOW (ref 22–32)
CO2: 21 mmol/L — ABNORMAL LOW (ref 22–32)
CO2: 22 mmol/L (ref 22–32)
Calcium: 8.2 mg/dL — ABNORMAL LOW (ref 8.9–10.3)
Calcium: 8.3 mg/dL — ABNORMAL LOW (ref 8.9–10.3)
Calcium: 8.2 mg/dL — ABNORMAL LOW (ref 8.9–10.3)
Calcium: 8.1 mg/dL — ABNORMAL LOW (ref 8.9–10.3)
Chloride: 98 mmol/L (ref 98–111)
Chloride: 102 mmol/L (ref 98–111)
Chloride: 101 mmol/L (ref 98–111)
Chloride: 100 mmol/L (ref 98–111)
Creatinine, Ser: 1.2 mg/dL — ABNORMAL HIGH (ref 0.44–1.00)
Creatinine, Ser: 0.96 mg/dL (ref 0.44–1.00)
Creatinine, Ser: 1.14 mg/dL — ABNORMAL HIGH (ref 0.44–1.00)
Creatinine, Ser: 1.16 mg/dL — ABNORMAL HIGH (ref 0.44–1.00)
GFR, Estimated: 52 mL/min — ABNORMAL LOW (ref >60)
GFR, Estimated: >60 mL/min (ref >60)
GFR, Estimated: 55 mL/min — ABNORMAL LOW (ref >60)
GFR, Estimated: 54 mL/min — ABNORMAL LOW (ref >60)
Glucose, Bld: 166 mg/dL — ABNORMAL HIGH (ref 70–99)
Glucose, Bld: 103 mg/dL — ABNORMAL HIGH (ref 70–99)
Glucose, Bld: 111 mg/dL — ABNORMAL HIGH (ref 70–99)
Glucose, Bld: 124 mg/dL — ABNORMAL HIGH (ref 70–99)
Potassium: 3 mmol/L — ABNORMAL LOW (ref 3.5–5.1)
Potassium: 4.6 mmol/L (ref 3.5–5.1)
Potassium: 3.7 mmol/L (ref 3.5–5.1)
Potassium: 4 mmol/L (ref 3.5–5.1)
Sodium: 133 mmol/L — ABNORMAL LOW (ref 135–145)
Sodium: 134 mmol/L — ABNORMAL LOW (ref 135–145)
Sodium: 136 mmol/L (ref 135–145)
Sodium: 134 mmol/L — ABNORMAL LOW (ref 135–145)
Total Bilirubin: 34.4 mg/dL (ref 0.3–1.2)
Total Bilirubin: 28.8 mg/dL (ref 0.3–1.2)
Total Bilirubin: 34.4 mg/dL (ref 0.3–1.2)
Total Bilirubin: UNDETERMINED mg/dL (ref 0.3–1.2)
Total Protein: 6.5 g/dL (ref 6.5–8.1)
Total Protein: 6.2 g/dL — ABNORMAL LOW (ref 6.5–8.1)
Total Protein: 6.7 g/dL (ref 6.5–8.1)
Total Protein: 6.3 g/dL — ABNORMAL LOW (ref 6.5–8.1)

## 2021-10-05 LAB — BLOOD GAS, VENOUS
Acid-base deficit: 5 mmol/L — ABNORMAL HIGH (ref 0.0–2.0)
Bicarbonate: 21.6 mmol/L (ref 20.0–28.0)
O2 Saturation: 32.4 %
Patient temperature: 37.1
pCO2, Ven: 46 mmHg (ref 44–60)
pH, Ven: 7.28 (ref 7.25–7.43)
pO2, Ven: <31 mmHg — CL (ref 32–45)

## 2021-10-05 LAB — BASIC METABOLIC PANEL
Anion gap: 15 (ref 5–15)
Anion gap: 13 (ref 5–15)
BUN: 12 mg/dL (ref 6–20)
BUN: 12 mg/dL (ref 6–20)
CO2: 17 mmol/L — ABNORMAL LOW (ref 22–32)
CO2: 21 mmol/L — ABNORMAL LOW (ref 22–32)
Calcium: 8.1 mg/dL — ABNORMAL LOW (ref 8.9–10.3)
Calcium: 8.2 mg/dL — ABNORMAL LOW (ref 8.9–10.3)
Chloride: 98 mmol/L (ref 98–111)
Chloride: 98 mmol/L (ref 98–111)
Creatinine, Ser: 0.9 mg/dL (ref 0.44–1.00)
Creatinine, Ser: 1.28 mg/dL — ABNORMAL HIGH (ref 0.44–1.00)
GFR, Estimated: >60 mL/min (ref >60)
GFR, Estimated: 48 mL/min — ABNORMAL LOW (ref >60)
Glucose, Bld: 129 mg/dL — ABNORMAL HIGH (ref 70–99)
Glucose, Bld: 174 mg/dL — ABNORMAL HIGH (ref 70–99)
Potassium: 6.1 mmol/L — ABNORMAL HIGH (ref 3.5–5.1)
Potassium: 3 mmol/L — ABNORMAL LOW (ref 3.5–5.1)
Sodium: 130 mmol/L — ABNORMAL LOW (ref 135–145)
Sodium: 132 mmol/L — ABNORMAL LOW (ref 135–145)

## 2021-10-05 LAB — GLUCOSE, CAPILLARY
Glucose-Capillary: 141 mg/dL — ABNORMAL HIGH (ref 70–99)
Glucose-Capillary: 95 mg/dL (ref 70–99)
Glucose-Capillary: 102 mg/dL — ABNORMAL HIGH (ref 70–99)
Glucose-Capillary: 93 mg/dL (ref 70–99)
Glucose-Capillary: 106 mg/dL — ABNORMAL HIGH (ref 70–99)
Glucose-Capillary: 99 mg/dL (ref 70–99)
Glucose-Capillary: 143 mg/dL — ABNORMAL HIGH (ref 70–99)
Glucose-Capillary: 173 mg/dL — ABNORMAL HIGH (ref 70–99)
Glucose-Capillary: 132 mg/dL — ABNORMAL HIGH (ref 70–99)

## 2021-10-05 LAB — LACTIC ACID, PLASMA
Lactic Acid, Venous: 6.9 mmol/L (ref 0.5–1.9)
Lactic Acid, Venous: 4.8 mmol/L (ref 0.5–1.9)
Lactic Acid, Venous: 3.1 mmol/L (ref 0.5–1.9)
Lactic Acid, Venous: 3.8 mmol/L (ref 0.5–1.9)
Lactic Acid, Venous: 3.6 mmol/L (ref 0.5–1.9)
Lactic Acid, Venous: 4 mmol/L (ref 0.5–1.9)

## 2021-10-05 LAB — CBC
HCT: 25.2 % — ABNORMAL LOW (ref 36.0–46.0)
Hemoglobin: 8.5 g/dL — ABNORMAL LOW (ref 12.0–15.0)
MCH: 33.7 pg (ref 26.0–34.0)
MCHC: 33.7 g/dL (ref 30.0–36.0)
MCV: 100 fL (ref 80.0–100.0)
Platelets: 197 10*3/uL (ref 150–400)
RBC: 2.52 MIL/uL — ABNORMAL LOW (ref 3.87–5.11)
RDW: 32.3 % — ABNORMAL HIGH (ref 11.5–15.5)
WBC: 15.4 10*3/uL — ABNORMAL HIGH (ref 4.0–10.5)
nRBC: 1.9 % — ABNORMAL HIGH (ref 0.0–0.2)

## 2021-10-05 LAB — MAGNESIUM: Magnesium: 1.8 mg/dL (ref 1.7–2.4)

## 2021-10-05 LAB — URINE CULTURE

## 2021-10-05 LAB — PROTIME-INR
INR: 1.9 — ABNORMAL HIGH (ref 0.8–1.2)
Prothrombin Time: 21.7 seconds — ABNORMAL HIGH (ref 11.4–15.2)

## 2021-10-05 MED ORDER — HYDROXYZINE HCL 25 MG PO TABS
25.0000 mg | ORAL_TABLET | Freq: Two times a day (BID) | ORAL | Status: DC | PRN
  Administered 2021-10-05: 25 mg via ORAL
  Filled 2021-10-05: qty 1

## 2021-10-05 MED ORDER — PROSOURCE PLUS PO LIQD
30.0000 mL | Freq: Two times a day (BID) | ORAL | Status: DC
  Administered 2021-10-06 – 2021-10-08 (×4): 30 mL via ORAL
  Filled 2021-10-05 (×4): qty 30

## 2021-10-05 MED ORDER — HYDROMORPHONE HCL 1 MG/ML IJ SOLN
0.5000 mg | INTRAMUSCULAR | Status: DC | PRN
  Administered 2021-10-05 – 2021-10-09 (×5): 0.5 mg via INTRAVENOUS
  Filled 2021-10-05 (×6): qty 0.5

## 2021-10-05 MED ORDER — PANTOPRAZOLE SODIUM 40 MG PO TBEC: 40.0000 mg | DELAYED_RELEASE_TABLET | Freq: Every day | ORAL | Status: DC

## 2021-10-05 MED ORDER — ONDANSETRON HCL 4 MG/2ML IJ SOLN
4.0000 mg | Freq: Three times a day (TID) | INTRAMUSCULAR | Status: DC | PRN
  Administered 2021-10-09 – 2021-10-13 (×3): 4 mg via INTRAVENOUS
  Filled 2021-10-05 (×3): qty 2

## 2021-10-05 MED ORDER — POTASSIUM CHLORIDE 10 MEQ/100ML IV SOLN
10.0000 meq | INTRAVENOUS | Status: AC
  Administered 2021-10-05 (×4): 10 meq via INTRAVENOUS
  Filled 2021-10-05 (×4): qty 100

## 2021-10-05 MED ORDER — VITAMIN K1 10 MG/ML IJ SOLN
5.0000 mg | Freq: Every day | INTRAVENOUS | Status: AC
  Administered 2021-10-05 – 2021-10-07 (×3): 5 mg via INTRAVENOUS
  Filled 2021-10-05 (×3): qty 0.5

## 2021-10-05 MED ORDER — INSULIN ASPART 100 UNIT/ML IJ SOLN
0.0000 [IU] | INTRAMUSCULAR | Status: DC
  Administered 2021-10-05: 2 [IU] via SUBCUTANEOUS

## 2021-10-05 MED ORDER — LACTATED RINGERS IV SOLN: INTRAVENOUS | Status: DC

## 2021-10-05 MED ORDER — PANTOPRAZOLE SODIUM 40 MG PO TBEC
40.0000 mg | DELAYED_RELEASE_TABLET | Freq: Every day | ORAL | Status: DC
  Administered 2021-10-05 – 2021-10-10 (×6): 40 mg via ORAL
  Filled 2021-10-05 (×6): qty 1

## 2021-10-05 MED ORDER — POTASSIUM CHLORIDE CRYS ER 20 MEQ PO TBCR: 40.0000 meq | EXTENDED_RELEASE_TABLET | Freq: Two times a day (BID) | ORAL | Status: DC

## 2021-10-05 NOTE — Progress Notes (Signed)
°  Transition of Care (TOC) Screening Note   Patient Details  Name: Judy Meyer Date of Birth: 09-Sep-1960   Transition of Care [facility]) CM/SW Contact:    Cyndi Bender, RN Phone Number: 10/05/2021, 2:48 PM    Transition of Care Department [facility]) has reviewed patient and no TOC needs have been identified at this time. We will continue to monitor patient advancement through interdisciplinary progression rounds. If new patient transition needs arise, please place a TOC consult.

## 2021-10-05 NOTE — Progress Notes (Addendum)
Daily Rounding Note  10/05/2021, 11:57 AM  LOS: 2 days   SUBJECTIVE:   Chief complaint:   Acute alcoholic hepatitis.  Had at least 2 stools yesterday, 2+ stools today.  Complains of abdominal pain.  OBJECTIVE:         Vital signs in last 24 hours:    Temp:  [97.6 F (36.4 C)-98.8 F (37.1 C)] 98.2 F (36.8 C) (02/27 0831) Pulse Rate:  [77-94] 94 (02/27 0831) Resp:  [6-20] 14 (02/27 0831) BP: (102-144)/(55-76) 120/59 (02/27 0831) SpO2:  [91 %-100 %] 100 % (02/27 0831) Last BM Date : 10/04/21 Filed Weights   10/04/21 0337  Weight: 63.7 kg   General: Jaundiced, acutely, chronically ill-appearing Heart: RRR. Chest: Clear bilaterally.  No labored breathing or cough.   Abdomen: Distended.  Not tender.  Liver edge palpable. Extremities: Lower extremity edema. Neuro/Psych: Oriented to year, [facility].  Somnolent and drifted off to "sleep" during conversation but arouses to a state of psychomotor slowing.  Asterixis present. Derm: Jaundiced.  Patient scratching at her self on the belly and arms but do not see excoriations.   Lab Results: Recent Labs    10/02/2021 1635 10/04/21 0330 10/05/21 0111  WBC 17.2* 16.2* 15.4*  HGB 10.8* 9.2* 8.5*  HCT 31.6* 27.3* 25.2*  PLT 203 172 197   BMET Recent Labs    10/04/21 2200 10/05/21 0111 10/05/21 0347  NA 130* 133* 134*  K 3.7 3.0* 4.6  CL 99 98 102  CO2 16* 19* 16*  GLUCOSE 242* 166* 103*  BUN 11 6 9   CREATININE 0.96 1.20* 0.96  CALCIUM 7.7* 8.2* 8.3*   LFT Recent Labs    10/04/21 0330 10/05/21 0111 10/05/21 0347  PROT 6.2* 6.5 6.2*  ALBUMIN 2.3* 2.3* 2.2*  AST 301* 354* 301*  ALT 69* 83* 66*  ALKPHOS 151* 154* 178*  BILITOT 30.9* 34.4* 28.8*   PT/INR Recent Labs    10/04/21 0330 10/05/21 0111  LABPROT 20.3* 21.7*  INR 1.7* 1.9*   Hepatitis Panel Recent Labs    10/04/21 0021  HEPBSAG NON REACTIVE  HCVAB NON REACTIVE  HEPAIGM NON  REACTIVE  HEPBIGM NON REACTIVE    Studies/Results: US Abdomen Complete  Result Date: 09/18/2021 CLINICAL DATA:  Acute on chronic liver disease with jaundice, earlier study today showing gallbladder thickening and positive sonographic Murphy's sign. EXAM: ABDOMEN ULTRASOUND COMPLETE COMPARISON:  The CT with IV contrast 07/25/2021. FINDINGS: Gallbladder: There is hypoechoic layering sludge without visible shadowing stones. There is trace pericholecystic fluid. The free wall is thickened up to 8.1 mm and there is positive right upper abdominal tenderness concerning for acute cholecystitis. The wall thickening and positive right upper abdominal tenderness could alternatively have a basis in hepatic dysfunction and/or hepatitis or other adjacent inflammatory process. Wall thickening was not seen on the 07/25/2021 CT. Common bile duct: Diameter: Prominent measuring 8.1 mm, but previously measured 7 mm. No intrahepatic biliary dilatation. Liver: No focal lesion identified through the patient's steatosis. The liver is diffusely attenuating consistent with fatty replacement, with moderate to severe fatty replacement noted on the CT. The liver length was not measured today but was previously 17 cm. Portal vein is patent on color Doppler imaging with reversal of flow. It is otherwise not well seen today but previously measured prominent at 14 mm. IVC: No abnormality visualized. Pancreas: Body of the pancreas is visible and unremarkable. The head, neck and tail segments are obscured by bowel  gas. Spleen: Size and appearance within normal limits. Right Kidney: Length: 11.4 cm. Cortical echogenicity and thickness within normal limits. No hydronephrosis or stone visualized. Again noted is a 3.5 cm mass posteriorly with uniform increased echogenicity consistent with the angiomyolipoma noted on the CT, lower pole. No other mass is seen. There was a 3 mm lower pole caliceal stone on the CT but it is not visible sonographically  , possibly obscured by bowel gas shadowing or passed in the interval. Left Kidney: Length: 9.5 cm. Echogenicity within normal limits. No mass, stone or hydronephrosis visualized. Abdominal aorta: No aneurysm visualized. Other findings: No ascites. IMPRESSION: 1. Gallbladder sludge without shadowing stones, with wall thickening, pericholecystic fluid and positive sonographic Murphy sign. Findings suspicious for cholecystitis. With hepatic dysfunction, alternative etiology could be congestive or reactive thickening such as with hepatitis, or adjacent inflammatory process. 2. The pancreas mostly obscured but normal where visible. 3. Prominent common bile duct.  No intrahepatic biliary prominence. 4. Diffuse liver attenuation consistent with the fatty replacement noted on the CT. 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 6. Reversed flow in the hepatic portal vein. The portal vein was otherwise not well enough visualized to measure its diameter but it was previously slightly prominent. Electronically Signed   By: Telford Nab M.D.   On: 09/19/2021 22:45   DG CHEST PORT 1 VIEW  Result Date: 09/14/2021 CLINICAL DATA:  Jaundice, abdominal pain and sepsis. EXAM: PORTABLE CHEST 1 VIEW COMPARISON:  PA Lat 05/14/2006. FINDINGS: The heart size and mediastinal contours are within normal limits. Both lungs are clear apart from linear scarring or atelectasis laterally in the left base. The visualized skeletal structures are unremarkable. IMPRESSION: No evidence of acute chest disease. Electronically Signed   By: Telford Nab M.D.   On: 09/23/2021 21:03   US LIVER DOPPLER  Result Date: 09/16/2021 CLINICAL DATA:  Jaundice, acute on chronic liver disease EXAM: DUPLEX ULTRASOUND OF LIVER TECHNIQUE: Color and duplex Doppler ultrasound was performed to evaluate the hepatic in-flow and out-flow vessels. COMPARISON:  CT 07/25/2021 FINDINGS: Liver: Diffusely increased parenchymal echogenicity with extremely poor acoustic through  transmission in keeping with changes of severe hepatic steatosis. Assessment of the hepatic parenchyma is resultantly markedly limited. Limited images of the hepatic contour demonstrate no obvious nodularity. No definite focal intrahepatic mass identified. Main Portal Vein size: 12 mm cm Portal Vein Velocities Main Prox:  -63 cm/sec Main Mid: -55 cm/sec Main Dist:  -79 cm/sec Right: -38 cm/sec Left: 35 cm/sec Hepatic Vein Velocities Right:  17 cm/sec Middle:  30 cm/sec Left:  24 cm/sec IVC: Patent Hepatic Artery Velocity:  67 cm/sec Splenic Vein Velocity:  14 cm/sec Spleen: 6.9 cm x 10.4 cm x 8.3 cm with a total volume of 313 cm^3 (411 cm^3 is upper limit normal) Portal Vein Occlusion/Thrombus: No Splenic Vein Occlusion/Thrombus: No Ascites: None Varices: None Superior mesenteric vein appears patent centrally. IMPRESSION: Markedly limited examination secondary to severe hepatic steatosis and resultant poor acoustic through transmission. Hepatofugal flow within the right portal vein and main portal vein. Preserved antegrade flow within the left portal vein. Splenic vein and superior mesenteric vein appear patent. Electronically Signed   By: Fidela Salisbury M.D.   On: 09/09/2021 22:42   US Abdomen Limited RUQ (LIVER/GB)  Result Date: 09/21/2021 CLINICAL DATA:  RIGHT-sided abdominal pain.  Jaundice EXAM: ULTRASOUND ABDOMEN LIMITED RIGHT UPPER QUADRANT COMPARISON:  CT 07/25/2021, ultrasound 07/24/2021 FINDINGS: Gallbladder: Sludge within the gallbladder. The gallbladder wall is thickened. Positive sonographic Murphy's sign. Common  bile duct: Diameter: Normal at 4 mm Liver: Increased echogenicity. Portal vein is patent on color Doppler imaging with normal direction of blood flow towards the liver. Other: None. IMPRESSION: Pericholecystic fluid, gallbladder wall thickening and positive sonographic Murphy's sign are concerning for acute cholecystitis. Electronically Signed   By: Suzy Bouchard M.D.   On: 09/28/2021  18:07    Scheduled Meds:  chlorhexidine  15 mL Mouth Rinse BID   enoxaparin (LOVENOX) injection  40 mg Subcutaneous Q24H   feeding supplement  1 Container Oral TID BM   folic acid  1 mg Oral Daily   lactulose  20 g Oral TID   levETIRAcetam  500 mg Oral BID   levothyroxine  75 mcg Oral Q0600   mouth rinse  15 mL Mouth Rinse q12n4p   multivitamin with minerals  1 tablet Oral Daily   pantoprazole (PROTONIX) IV  40 mg Intravenous QHS   prednisoLONE  40 mg Oral Daily   rifaximin  550 mg Oral BID   thiamine  100 mg Oral Daily   Or   thiamine  100 mg Intravenous Daily   Continuous Infusions:  piperacillin-tazobactam (ZOSYN)  IV 3.375 g (10/05/21 2841)   PRN Meds:.HYDROmorphone (DILAUDID) injection, hydrOXYzine, ondansetron (ZOFRAN) IV   ASSESMENT:   Alcoholic liver dz, w acute alcoholic hepatitis.  On ultrasound liver is diffusely attenuated with fatty replacement, no suspicious lesions for HCCA. Day 2 Prednisolone, Lille score due 10/10/21.  Discr fx score today is 71.  Labs in 07/2020: viral hepatitis, CMV, EBV, ANA, AMA, ASMA were negative.  Eevated ferritin of 802 and a transferrin saturation of 88%.  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 Ceruloplasmin just below the lower limits of normal at 17.2.  Liver biopsy 07/2020 showing 90% macrovesicular steatosis, minimal steatohepatitis, cholestasis w ductal injury and reaction, F1 fibrosis, no significant iron deposition.  DDx included secondary sclerosing cholangitis vs drug-induced liver disease.  Automimmune markers unrevalingTreated with steroids and ursodiol at that time.  Restarted same in January 2021 with prednisone and ursodiol..  [facility] feels there could be component of drug-induced liver injury but response to prednisolone points towards acute alcoholic hepatitis as of office note in July 2022.  Alcohol use disorder, not in remission.  2 weeks of progressive upper abdominal pain, nausea, vomiting, progressive  jaundice.  Lactic acidosis.  GB sludge, GB wall thickening, 8.1 mm CBD.Marland Kitchen  Acute cholecystitis? High risk for operative intervention.  Zosyn in place for possible cholecystitis  Reversal of blood flow through hepatic portal vein.  Portal vein not well visualized but no portal vein thrombosis, certainly at risk for PVT.  Hepatic encephalopathy.  Ammonia 106.  On rifaximin, lactulose (new since admit).  Somnolence, Psychomotor retardation without overt confusion or disorientation but persistent asterixis on exam today.  Coagulopathy.  Progressive.  Got 10 mg of vitamin K yesterday.  Severe, persistent metabolic acidosis. Lactate 8.4 .. 3.8.  Sodium bicarb discontinues as of 0645 today.    Normocytic anemia.  Hgb 10.8..  8.5 .  No overt bleeding  Hyponatremia.     PLAN   Ordered IV vitamin K for 3 days, starting today.  Continue Prednisolone.  Continue empiric Zosyn.      Azucena Freed  10/05/2021, 11:57 AM Phone [phone]   GI Attending:  She was asleep but aroused and alert and oriented w/o asterixis on my exam. She remains very ill and will have to see how she does. Continue steroids for now and aggressive supportive care for  acute liver failure from alcoholic hepatitis.  Doubt cholecystitis. Will f/u - trend INR, LFTs, lactate and CBC  Gatha Mayer, MD, [facility] 10/05/2021 5:41 PM

## 2021-10-05 NOTE — Progress Notes (Signed)
HD#2 SUBJECTIVE:  Patient Summary: Judy Meyer is a 61 y.o. with a pertinent PMH of alcohol associated hepatitis, hypothyroidism, hypertension, seizures, anemia, hyperlipidemia and alcohol use disorder who presented with worsening of her jaundice, nausea, vomiting and admitted for acute alcoholic hepatitis.   Overnight Events: None.   Interim History: Patient I slow to wake up but says that she feels about the same as yesterday. She still has RUQ pain but it is unchanged. She endorses BM however is unable to quantify this. She is very somnolent and frequently drifts back to sleep during our conversation and exam.    OBJECTIVE:  Vital Signs: Vitals:   10/05/21 0300 10/05/21 0305 10/05/21 0400 10/05/21 0500  BP:  (!) 102/59 (!) 103/57   Pulse: 77 78 79   Resp: 15 11 12 12   Temp:  98.7 F (37.1 C)    TempSrc:  Oral    SpO2: 100% 100% 100%   Weight:      Height:       Supplemental O2: Room Air SpO2: 100 % O2 Flow Rate (L/min): 2 L/min FiO2 (%): 21 %  Filed Weights   10/04/21 0337  Weight: 63.7 kg     Intake/Output Summary (Last 24 hours) at 10/05/2021 0723 Last data filed at 10/05/2021 0539 Gross per 24 hour  Intake 240 ml  Output --  Net 240 ml   Net IO Since Admission: 2,619.13 mL [10/05/21 0723]  Physical Exam: Constitutional:Chronically ill appearing female sleeping in bed. Eyes:Sclera are icteric. Cardio:Regular rate and rhythm. No murmurs, rubs, gallops. Pulm:Normal work of breathing on room air. Abdomen:Mild tenderness to palpation over RUQ. JQB:HALPFXTK for extremity edema. Skin:Jaundiced. Neuro:Somnolent and drifts to sleep multiple times during physical exam. Knows that she is in the hospital.  Patient Lines/Drains/Airways Status     Active Line/Drains/Airways     Name Placement date Placement time Site Days   Peripheral IV 09/16/2021 24 G Anterior;Left Forearm 10/01/2021  1707  Forearm  2   Peripheral IV 09/30/2021 20 G Anterior;Right Forearm 09/19/2021   1810  Forearm  2   External Urinary Catheter 10/04/21  2251  --  1             ASSESSMENT/PLAN:  Assessment: Principal Problem:   Alcoholic hepatitis Active Problems:   Hypokalemia   Hyperbilirubinemia   Acidosis, metabolic   Leukocytosis   Lactic acidosis   Plan: Judy Meyer is a 61 y.o. with a pertinent PMH of alcohol associated hepatitis, hypothyroidism, hypertension, seizures, anemia, hyperlipidemia and alcohol use disorder who presented with worsening of her jaundice, nausea, vomiting and admitted for acute alcoholic hepatitis.    Acute alcoholic hepatitis Acute cholecystitis Unfortunately improvements in aminotransferases have started trending upwards again. She is being managed for possible cholecystitis however it's more likely that cholecystitis presentation is inflammation related to acute liver failure. Maddrey score 73.4 indicating poor prognosis and discriminant function. -GI following, appreciate their assistance  -Vitamin K IV for 3 days -Surgery following, appreciate their assistance  -Recommending continued GI workup and consideration of transfer back to primary institution where workup is occurring for liver transplant -Continue zosyn IV 3.375 g TID, day 2/7 -Prednisolone 40 mg daily -Lactulose 20 g 3 times daily; have room to increase this dose for TID BM -Rifaximin 550 mg BID -Appreciate Dr. Loletha Carrow' recommendations   Persistent anion gap metabolic acidosis Patient previously receiving bicarb+LR, was discontinued this morning as VBG showed pH>7.2 and improvement of LA. We have been trending CMP and LA  with labs trending up again, most recent LA 3.8 (was 3.1 on AM labs). Bicarb improved to 21. -Trend BMP, LA q3h   High risk for alcohol withdrawal Alcohol Use Disorder CIWA 3 most recently. Patient is high risk for severe alcohol withdrawal. Currently on CIWA protocol without Ativan. Continue to monitor and if begins showing signs of withdrawal, can use  ativan.   Electrolyte abnormalities Continue to replete as needed.   -Trend CMP   Hx of seizure disorder Continue keppra 500 mg bid   Best Practice: Diet: Full liquid diet, feeding supplements VTE: SCDs Code: Full PT/OT recs: Pending.  Not medically stable for evaluation Dispo: Pending further work-up and evaluation for acute hepatitis, encephalopathy and severe metabolic acidosis  Signature: Farrel Gordon, D.O.  Internal Medicine Resident, PGY-1 [facility]  Pager: [phone] 7:23 AM, 10/05/2021   Please contact the on call pager after 5 pm and on weekends at [phone].

## 2021-10-05 NOTE — Consult Note (Signed)
NAME:  Judy Meyer, MRN:  [id number], DOB:  1961-07-24, LOS: 2 ADMISSION DATE:  10/05/2021, CONSULTATION DATE:  2/26 REFERRING MD:  Johnney Ou, CHIEF COMPLAINT:  metabolic acidosis    History of Present Illness:  61 year old female patient With a significant history of alcohol abuse.  Also followed by [facility] hepat[facility].  Presented to the emergency room on 2/25 with chief complaint of approximately 2-week history of progressive upper abdominal pain nausea vomiting and worsening jaundice.  Continues to drink alcohol however volume has been reduced due to nausea and vomiting.  Noting she had been urinating less, felt weak, overall presented with chief complaint of abdominal pain, and "turning yellow".  Seen by the internal medicine service diagnostic evaluation was compatible with acute alcoholic hepatitis, lactic acidosis, hyperammonemia, abnormal imaging by ultrasound of gallbladder suggesting gallbladder wall thickening and sonographic Murphy sign and encephalopathy.  She was started on IV hydration.  Empiric antibiotics were started, both GI and surgical services were consulted. Over the course of her hospitalization she has continued to have significant lactic acidosis, As high as greater than 9, with the lowest of 8.1.  At time of consultation 8.4.  In review of her blood chemistries she has had persistent decline in her serum bicarbonate.  Because of her profound metabolic derangements critical care was asked to evaluate, and determine if higher level of care was warranted.  Pertinent  Medical History  Hypertension, hypothyroidism, uterine fibroids, liver disease.  Significant Hospital Events: Including procedures, antibiotic start and stop dates in addition to other pertinent events   2/25: Admitted with working diagnosis of acute alcoholic hepatitis with elevated AST and ALT as well as alk phos.  Total bilirubin 33.5.  Ultrasound of abdomen obtained showed, pericholecystic  fluid and gallbladder wall thickening.  Last drink 24 hours prior to presentation.   2/26: Seen by both GI and general surgery.  They felt this was primarily just acute liver failure and unlikely infection but recommended continuing antibiotics.  Also seen by gastroenterology they started prednisolone as well as lactulose and rifaximin critical care asked to evaluate given severe metabolic acidosis 0/27 T bili going down, Cr a bit better, alert, interactive, still with element of HE on exam  Interim History / Subjective:  Says feels ok, better than last day or two  Objective   Blood pressure (!) 120/59, pulse 94, temperature 98.2 F (36.8 C), temperature source Axillary, resp. rate 14, height $RemoveBe'5\' 6"'[id number]$  (1.676 m), weight 63.7 kg, last menstrual period 06/11/2018, SpO2 100 %.        Intake/Output Summary (Last 24 hours) at 10/05/2021 0908 Last data filed at 10/05/2021 7412 Gross per 24 hour  Intake 240 ml  Output --  Net 240 ml    Filed Weights   10/04/21 8786  Weight: 63.7 kg    Examination: General: Jaundiced ill-appearing 61 year old female sitting up in bed HENT: Normocephalic atraumatic sclera are icteric mucous membranes moist Lungs: Clear to auscultation Cardiovascular: Regular rate and rhythm Abdomen: Soft tender to palpation bowel sounds present Extremities: Diffusely jaundiced over entire body pulses palpable Neuro: Awake oriented, moving all extremities  Resolved Hospital Problem list     Assessment & Plan:  Acute alcoholic hepatitis with what appears to be acute hepatic failure with INR 1.7, elevated transaminases, HE Plan Starting prednisolone as directed by gastroenterology Alcohol cessation Serial LFTs MELD-Na 28 2/27  Hepatic encephalopathy she is at risk for alcohol withdrawal Actually alert and oriented on exam currently Plan Rifaximin and lactulose  Acute metabolic acidosis.  This is a mixed picture of both anion gap metabolic acidosis from her slow to  clear lactic acidosis as well as non-anion gap component likely from nausea and vomiting and bicarbonate loss Plan Close monitoring of blood chemistry  Fluid and electrolyte balance: Hyponatremia Plan Serial labs  Coagulopathy in the setting of alcoholic hepatitis Plan Vitamin K, INR rising despite this  Anemia of chronic disease Plan Trend CBC  History of seizure disorder Plan Keppra  PCCM will sign off  Best Practice (right click and "Reselect all SmartList Selections" daily)   Diet/type: clear liquids DVT prophylaxis: SCD GI prophylaxis: N/A Lines: N/A Foley:  N/A Code Status:  full code Last date of multidisciplinary goals of care discussion [full code ]  Labs   CBC: Recent Labs  Lab 09/13/2021 1635 10/04/21 0330 10/05/21 0111  WBC 17.2* 16.2* 15.4*  HGB 10.8* 9.2* 8.5*  HCT 31.6* 27.3* 25.2*  MCV 97.2 98.9 100.0  PLT 203 172 197     Basic Metabolic Panel: Recent Labs  Lab 09/09/2021 1705 10/04/21 0021 10/04/21 0330 10/04/21 1804 10/04/21 2008 10/04/21 2200 10/05/21 0111 10/05/21 0347  NA  --   --    < > 131* 132* 130* 133* 134*  K  --   --    < > 2.9* 4.4 3.7 3.0* 4.6  CL  --   --    < > 100 101 99 98 102  CO2  --   --    < > 16* 14* 16* 19* 16*  GLUCOSE  --   --    < > 132* 182* 242* 166* 103*  BUN  --   --    < > $R'10 11 11 6 9  'NL$ CREATININE  --   --    < > 1.12* 0.96 0.96 1.20* 0.96  CALCIUM  --   --    < > 7.9* 7.9* 7.7* 8.2* 8.3*  MG 2.4 2.1  --  1.8  --   --   --   --   PHOS 3.2  --   --   --   --   --   --   --    < > = values in this interval not displayed.    GFR: Estimated Creatinine Clearance: 59.1 mL/min (by C-G formula based on SCr of 0.96 mg/dL). Recent Labs  Lab 09/16/2021 1635 10/04/2021 1803 10/04/21 0330 10/04/21 0700 10/04/21 0948 10/05/21 0111 10/05/21 0347 10/05/21 0759  WBC 17.2*  --  16.2*  --   --  15.4*  --   --   LATICACIDVEN  --    < >  --    < > 8.4* 6.9* 4.8* 3.1*   < > = values in this interval not displayed.      Liver Function Tests: Recent Labs  Lab 09/11/2021 1635 10/04/21 0330 10/05/21 0111 10/05/21 0347  AST 317* 301* 354* 301*  ALT 80* 69* 83* 66*  ALKPHOS 175* 151* 154* 178*  BILITOT 33.5* 30.9* 34.4* 28.8*  PROT 7.2 6.2* 6.5 6.2*  ALBUMIN 2.7* 2.3* 2.3* 2.2*    Recent Labs  Lab 09/27/2021 1635  LIPASE 93*    Recent Labs  Lab 10/01/2021 1713  AMMONIA 106*     ABG    Component Value Date/Time   HCO3 21.6 10/05/2021 0111   TCO2 14 (L) 03/16/2021 1007   ACIDBASEDEF 5.0 (H) 10/05/2021 0111   O2SAT 32.4 10/05/2021 0111      Coagulation  Profile: Recent Labs  Lab 09/11/2021 1808 10/04/21 0330 10/05/21 0111  INR 1.6* 1.7* 1.9*     Cardiac Enzymes: No results for input(s): CKTOTAL, CKMB, CKMBINDEX, TROPONINI in the last 168 hours.  HbA1C: Hgb A1c MFr Bld  Date/Time Value Ref Range Status  07/18/2020 01:27 PM 5.1 4.8 - 5.6 % Final    Comment:    (NOTE) Pre diabetes:          5.7%-6.4%  Diabetes:              >6.4%  Glycemic control for   <7.0% adults with diabetes     CBG: Recent Labs  Lab 10/04/21 2312 10/05/21 0154 10/05/21 0331 10/05/21 0525 10/05/21 0831  GLUCAP 204* 141* 95 102* 93    Review of Systems:   Not able   Past Medical History:  She,  has a past medical history of Hypertension, Hypothyroidism, Liver disease, and Uterine fibroid.   Surgical History:   Past Surgical History:  Procedure Laterality Date   hand fracture surgery Right    LIVER BIOPSY       Social History:   reports that she has never smoked. She has never used smokeless tobacco. She reports current alcohol use. She reports that she does not use drugs.   Family History:  Her family history includes High Cholesterol in her mother; Hypertension in her mother and another family member.   Allergies Allergies  Allergen Reactions   Compazine [Prochlorperazine Edisylate] Anaphylaxis   Haloperidol Other (See Comments)    Other reaction(s): Seizure  (finding) Other reaction(s): Seizure (finding)    Periactin [Cyproheptadine] Anaphylaxis   Cyclobenzaprine Other (See Comments)    Palpations    Haloperidol And Related Other (See Comments)    Caused seizure   Metoclopramide Other (See Comments)    Caused seizure     Home Medications  Prior to Admission medications   Medication Sig Start Date End Date Taking? Authorizing Provider  omeprazole (PRILOSEC) 20 MG capsule Take 20 mg by mouth daily.   Yes [provider]  albuterol (VENTOLIN HFA) 108 (90 Base) MCG/ACT inhaler Inhale 2 puffs into the lungs 4 (four) times daily as needed for wheezing or shortness of breath. Patient not taking: Reported on 09/13/2021 12/20/19   [provider]  amLODipine (NORVASC) 10 MG tablet Take 1 tablet (10 mg total) by mouth daily. Patient not taking: Reported on 10/02/2021 11/21/20   Mercy Riding, MD  levETIRAcetam (KEPPRA) 500 MG tablet Take 1 tablet (500 mg total) by mouth 2 (two) times daily. Patient not taking: Reported on 10/02/2021 03/20/21   Hosie Poisson, MD  levothyroxine (SYNTHROID) 75 MCG tablet Take 1 tablet (75 mcg total) by mouth daily at 6 (six) AM. Patient not taking: Reported on 09/14/2021 03/21/21   Hosie Poisson, MD  ondansetron (ZOFRAN) 4 MG tablet Take 1 tablet (4 mg total) by mouth daily as needed for nausea or vomiting. Patient not taking: Reported on 09/27/2021 11/20/20 11/20/21  Mercy Riding, MD  ondansetron (ZOFRAN) 4 MG tablet Take 1 tablet (4 mg total) by mouth every 6 (six) hours. Patient not taking: Reported on 09/09/2021 07/25/21   Valarie Merino, MD  potassium chloride SA (KLOR-CON) 20 MEQ tablet Take 1 tablet (20 mEq total) by mouth daily. Patient not taking: Reported on 09/21/2021 03/20/21   Hosie Poisson, MD  venlafaxine XR (EFFEXOR-XR) 75 MG 24 hr capsule Take 1 capsule (75 mg total) by mouth daily. Patient not taking: Reported on 10/02/2021 11/20/20  Mercy Riding, MD     Critical care time: n/a      Lanier Clam, MD [place] for contact info

## 2021-10-05 NOTE — Progress Notes (Signed)
STAT ABG ORDERED DUE TO ABNORMAL VENOUS PO2 LESS THAN 31 PER LAB , RT CALLED

## 2021-10-05 NOTE — Progress Notes (Signed)
Initial Nutrition Assessment  DOCUMENTATION CODES:   Non-severe (moderate) malnutrition in context of chronic illness  INTERVENTION:  - Continue MVI daily  - Continue Boost Breeze po TID, each supplement provides 250 kcal and 9 grams of protein  - Prosource plus po 30 mL TID, each supplement provides 100 kcal and 15 grams of protein   - Encourage PO intake   NUTRITION DIAGNOSIS:   Moderate Malnutrition related to chronic illness (alcohol associated hepatitis) as evidenced by mild fat depletion, moderate muscle depletion, percent weight loss (18% weight loss in the last 6 months).   GOAL:   Patient will meet greater than or equal to 90% of their needs   MONITOR:   PO intake, Supplement acceptance, Diet advancement, Labs, Weight trends  REASON FOR ASSESSMENT:   Consult Assessment of nutrition requirement/status  ASSESSMENT:   Pt is a 61 year old female with medical history significant of alcohol use disorder, HTN, hypothyroidism, seizures, anemia, hyperlipidemia, alcohol associated hepatitis followed by [facility] hepatology who presented to the ED with abdominal pain and jaundice and was admitted for alcoholic hepatitis.  Diet advanced from clear liquid diet to full liquid diet on 2/26. No documented meal completions in chart.   Met with pt at bedside. Pt very lethargic during visit and in and out of sleep; difficult to keep pt awake as pt's eyes remained close majority of visit. Pt very fidgety during visit. Pt able to answer some questions appropriately. Per pt, pt reports that she does have an appetite today and when asked if pt was hungry, she says, "yes." Per pt, pt had a very little appetite prior to admission. Unable to collect a 24-hour recall from pt due to alertness and orientation of pt, however, pt states that she does not eat meals throughout the day but likes to eat pizza. Unable to collect good history of pt's etoh use, however, pt states that she was drinking small  amounts of alcohol, prior to admission. When asking pt what type of alcohol (beer or liquor), pt kept falling asleep and would not answer. Pt reports that she has had no diarrhea today, but does report that she is nauseous. Pt not appropriately answering questions related to social and weight history, so unable to obtain this information. Dietetic intern to continue MVI daily and Boost Breeze TID for pt. Dietetic intern recommend Prosource Plus 30 ml TID for pt to aid in caloric and protein intake as pt has poor PO intake and moderately malnourished per NFPE.   Weight on 03/16/21 was 170 lb and weight on 10/04/21 was 140. This indicates a 18% weight loss in the past 6 months.   Medications reviewed and include: boost breeze tid, folic acid, lactulose, MVI with minerals, protonix, rifaximin, thiamine tablet 100 mg  Labs reviewed and include:  Corrected calcium: 9.74 Hemoglobin: 8.5    NUTRITION - FOCUSED PHYSICAL EXAM:  Flowsheet Row Most Recent Value  Orbital Region Mild depletion  Upper Arm Region Moderate depletion  Thoracic and Lumbar Region Mild depletion  Buccal Region Mild depletion  Temple Region No depletion  Clavicle Bone Region No depletion  Clavicle and Acromion Bone Region Mild depletion  Scapular Bone Region Mild depletion  Dorsal Hand No depletion  Patellar Region Mild depletion  Anterior Thigh Region Moderate depletion  Posterior Calf Region Moderate depletion  Edema (RD Assessment) None  Hair Reviewed  Eyes Unable to assess  Mouth Unable to assess  Skin Reviewed  [jaundice]  Nails Reviewed  Diet Order:   Diet Order             Diet full liquid Room service appropriate? Yes; Fluid consistency: Thin  Diet effective now                   EDUCATION NEEDS:   Not appropriate for education at this time  Skin:  Skin Assessment: Reviewed RN Assessment  Last BM:  2/26; type 6  Height:   Ht Readings from Last 1 Encounters:  10/04/21 _0  (1.676 m)     Weight:   Wt Readings from Last 1 Encounters:  10/04/21 63.7 kg    BMI:  Body mass index is 22.67 kg/m.  Estimated Nutritional Needs:   Kcal:  1850- 2050  Protein:  90 - 105 grams  Fluid:  >/= 1.8 L    Maryruth Hancock, Dietetic Intern 10/05/2021 4:09 PM

## 2021-10-05 NOTE — Progress Notes (Signed)
General Surgery Follow Up Note  Subjective:    Overnight Issues:   Objective:  Vital signs for last 24 hours: Temp:  [97.6 F (36.4 C)-98.8 F (37.1 C)] 98.2 F (36.8 C) (02/27 0831) Pulse Rate:  [77-94] 94 (02/27 0831) Resp:  [6-20] 14 (02/27 0831) BP: (102-144)/(55-76) 120/59 (02/27 0831) SpO2:  [91 %-100 %] 100 % (02/27 0831)  Hemodynamic parameters for last 24 hours:    Intake/Output from previous day: 02/26 0701 - 02/27 0700 In: 240 [P.O.:240] Out: -   Intake/Output this shift: No intake/output data recorded.  Vent settings for last 24 hours:    Physical Exam:  Gen: comfortable, no distress, extremely jaundiced Neuro: sleepy HEENT: PERRL Neck: supple CV: RRR Pulm: unlabored breathing Abd: soft, NT, distended Extr: wwp, no edema   Results for orders placed or performed during the hospital encounter of 09/14/2021 (from the past 24 hour(s))  Basic metabolic panel     Status: Abnormal   Collection Time: 10/04/21  4:38 PM  Result Value Ref Range   Sodium 132 (L) 135 - 145 mmol/L   Potassium 2.5 (LL) 3.5 - 5.1 mmol/L   Chloride 98 98 - 111 mmol/L   CO2 17 (L) 22 - 32 mmol/L   Glucose, Bld 105 (H) 70 - 99 mg/dL   BUN 10 6 - 20 mg/dL   Creatinine, Ser 1.14 (H) 0.44 - 1.00 mg/dL   Calcium 8.3 (L) 8.9 - 10.3 mg/dL   GFR, Estimated 55 (L) >60 mL/min   Anion gap 17 (H) 5 - 15  Basic metabolic panel     Status: Abnormal   Collection Time: 10/04/21  6:04 PM  Result Value Ref Range   Sodium 131 (L) 135 - 145 mmol/L   Potassium 2.9 (L) 3.5 - 5.1 mmol/L   Chloride 100 98 - 111 mmol/L   CO2 16 (L) 22 - 32 mmol/L   Glucose, Bld 132 (H) 70 - 99 mg/dL   BUN 10 6 - 20 mg/dL   Creatinine, Ser 1.12 (H) 0.44 - 1.00 mg/dL   Calcium 7.9 (L) 8.9 - 10.3 mg/dL   GFR, Estimated 57 (L) >60 mL/min   Anion gap 15 5 - 15  Magnesium     Status: None   Collection Time: 10/04/21  6:04 PM  Result Value Ref Range   Magnesium 1.8 1.7 - 2.4 mg/dL  Basic metabolic panel      Status: Abnormal   Collection Time: 10/04/21  8:08 PM  Result Value Ref Range   Sodium 132 (L) 135 - 145 mmol/L   Potassium 4.4 3.5 - 5.1 mmol/L   Chloride 101 98 - 111 mmol/L   CO2 14 (L) 22 - 32 mmol/L   Glucose, Bld 182 (H) 70 - 99 mg/dL   BUN 11 6 - 20 mg/dL   Creatinine, Ser 0.96 0.44 - 1.00 mg/dL   Calcium 7.9 (L) 8.9 - 10.3 mg/dL   GFR, Estimated >60 >60 mL/min   Anion gap 17 (H) 5 - 15  Basic metabolic panel     Status: Abnormal   Collection Time: 10/04/21 10:00 PM  Result Value Ref Range   Sodium 130 (L) 135 - 145 mmol/L   Potassium 3.7 3.5 - 5.1 mmol/L   Chloride 99 98 - 111 mmol/L   CO2 16 (L) 22 - 32 mmol/L   Glucose, Bld 242 (H) 70 - 99 mg/dL   BUN 11 6 - 20 mg/dL   Creatinine, Ser 0.96 0.44 - 1.00 mg/dL  Calcium 7.7 (L) 8.9 - 10.3 mg/dL   GFR, Estimated >60 >60 mL/min   Anion gap 15 5 - 15  Glucose, capillary     Status: Abnormal   Collection Time: 10/04/21 10:05 PM  Result Value Ref Range   Glucose-Capillary 230 (H) 70 - 99 mg/dL  Glucose, capillary     Status: Abnormal   Collection Time: 10/04/21 11:12 PM  Result Value Ref Range   Glucose-Capillary 204 (H) 70 - 99 mg/dL  CBC     Status: Abnormal   Collection Time: 10/05/21  1:11 AM  Result Value Ref Range   WBC 15.4 (H) 4.0 - 10.5 K/uL   RBC 2.52 (L) 3.87 - 5.11 MIL/uL   Hemoglobin 8.5 (L) 12.0 - 15.0 g/dL   HCT 25.2 (L) 36.0 - 46.0 %   MCV 100.0 80.0 - 100.0 fL   MCH 33.7 26.0 - 34.0 pg   MCHC 33.7 30.0 - 36.0 g/dL   RDW 32.3 (H) 11.5 - 15.5 %   Platelets 197 150 - 400 K/uL   nRBC 1.9 (H) 0.0 - 0.2 %  Comprehensive metabolic panel     Status: Abnormal   Collection Time: 10/05/21  1:11 AM  Result Value Ref Range   Sodium 133 (L) 135 - 145 mmol/L   Potassium 3.0 (L) 3.5 - 5.1 mmol/L   Chloride 98 98 - 111 mmol/L   CO2 19 (L) 22 - 32 mmol/L   Glucose, Bld 166 (H) 70 - 99 mg/dL   BUN 6 6 - 20 mg/dL   Creatinine, Ser 1.20 (H) 0.44 - 1.00 mg/dL   Calcium 8.2 (L) 8.9 - 10.3 mg/dL   Total Protein  6.5 6.5 - 8.1 g/dL   Albumin 2.3 (L) 3.5 - 5.0 g/dL   AST 354 (H) 15 - 41 U/L   ALT 83 (H) 0 - 44 U/L   Alkaline Phosphatase 154 (H) 38 - 126 U/L   Total Bilirubin 34.4 (HH) 0.3 - 1.2 mg/dL   GFR, Estimated 52 (L) >60 mL/min   Anion gap 16 (H) 5 - 15  Protime-INR     Status: Abnormal   Collection Time: 10/05/21  1:11 AM  Result Value Ref Range   Prothrombin Time 21.7 (H) 11.4 - 15.2 seconds   INR 1.9 (H) 0.8 - 1.2  Lactic acid, plasma     Status: Abnormal   Collection Time: 10/05/21  1:11 AM  Result Value Ref Range   Lactic Acid, Venous 6.9 (HH) 0.5 - 1.9 mmol/L  Blood gas, venous     Status: Abnormal   Collection Time: 10/05/21  1:11 AM  Result Value Ref Range   pH, Ven 7.28 7.25 - 7.43   pCO2, Ven 46 44 - 60 mmHg   pO2, Ven <31 (LL) 32 - 45 mmHg   Bicarbonate 21.6 20.0 - 28.0 mmol/L   Acid-base deficit 5.0 (H) 0.0 - 2.0 mmol/L   O2 Saturation 32.4 %   Patient temperature 37.1   Glucose, capillary     Status: Abnormal   Collection Time: 10/05/21  1:54 AM  Result Value Ref Range   Glucose-Capillary 141 (H) 70 - 99 mg/dL  Glucose, capillary     Status: None   Collection Time: 10/05/21  3:31 AM  Result Value Ref Range   Glucose-Capillary 95 70 - 99 mg/dL  Lactic acid, plasma     Status: Abnormal   Collection Time: 10/05/21  3:47 AM  Result Value Ref Range   Lactic Acid, Venous  4.8 (HH) 0.5 - 1.9 mmol/L  Comprehensive metabolic panel     Status: Abnormal   Collection Time: 10/05/21  3:47 AM  Result Value Ref Range   Sodium 134 (L) 135 - 145 mmol/L   Potassium 4.6 3.5 - 5.1 mmol/L   Chloride 102 98 - 111 mmol/L   CO2 16 (L) 22 - 32 mmol/L   Glucose, Bld 103 (H) 70 - 99 mg/dL   BUN 9 6 - 20 mg/dL   Creatinine, Ser 0.96 0.44 - 1.00 mg/dL   Calcium 8.3 (L) 8.9 - 10.3 mg/dL   Total Protein 6.2 (L) 6.5 - 8.1 g/dL   Albumin 2.2 (L) 3.5 - 5.0 g/dL   AST 301 (H) 15 - 41 U/L   ALT 66 (H) 0 - 44 U/L   Alkaline Phosphatase 178 (H) 38 - 126 U/L   Total Bilirubin 28.8 (HH) 0.3 -  1.2 mg/dL   GFR, Estimated >60 >60 mL/min   Anion gap 16 (H) 5 - 15  Glucose, capillary     Status: Abnormal   Collection Time: 10/05/21  5:25 AM  Result Value Ref Range   Glucose-Capillary 102 (H) 70 - 99 mg/dL  Lactic acid, plasma     Status: Abnormal   Collection Time: 10/05/21  7:59 AM  Result Value Ref Range   Lactic Acid, Venous 3.1 (HH) 0.5 - 1.9 mmol/L  Glucose, capillary     Status: None   Collection Time: 10/05/21  8:31 AM  Result Value Ref Range   Glucose-Capillary 93 70 - 99 mg/dL  Glucose, capillary     Status: Abnormal   Collection Time: 10/05/21 10:24 AM  Result Value Ref Range   Glucose-Capillary 106 (H) 70 - 99 mg/dL    Assessment & Plan: Present on Admission:  Leukocytosis  Hyperbilirubinemia  Hypokalemia  Acidosis, metabolic    LOS: 2 days   Additional comments:I reviewed the patient's new clinical lab test results.   and I reviewed the patients new imaging test results.    69F with HTN, hypothyroidism, H63D genetic mutation for hereditary hemochromatosis, nephrolithiasis, alcohol abuse and liver disease, alcohol hepatitis   EtOH on admission noted 216   - Agree with empiric coverage for cholecystitis for 7d although with her what appears to be acute liver failure, would be surprised if she also has cholecystitis. HIDA scan would likely be limited due to hepatic dysfunction. Noted HIDA 11/2020 without significant GB emptying. Would not recommend surgery at present with her portal hypertension and current decompensated state. Recommend continued GI w/u and consideration of transfer back to primary institution where w/u is occurring for liver transplantation.    Jesusita Oka, MD Trauma & General Surgery Please use [email or web address] to contact on call provider  10/05/2021  *Care during the described time interval was provided by me. I have reviewed this patient's available data, including medical history, events of note, physical examination and test results as  part of my evaluation.

## 2021-10-06 DIAGNOSIS — K709 Alcoholic liver disease, unspecified: Secondary | ICD-10-CM | POA: Diagnosis not present

## 2021-10-06 DIAGNOSIS — K7201 Acute and subacute hepatic failure with coma: Secondary | ICD-10-CM | POA: Diagnosis not present

## 2021-10-06 DIAGNOSIS — K7010 Alcoholic hepatitis without ascites: Secondary | ICD-10-CM | POA: Diagnosis not present

## 2021-10-06 LAB — HEPATIC FUNCTION PANEL
ALT: 90 U/L — ABNORMAL HIGH (ref 0–44)
AST: 365 U/L — ABNORMAL HIGH (ref 15–41)
Albumin: 2.3 g/dL — ABNORMAL LOW (ref 3.5–5.0)
Alkaline Phosphatase: 136 U/L — ABNORMAL HIGH (ref 38–126)
Bilirubin, Direct: 19.1 mg/dL — ABNORMAL HIGH (ref 0.0–0.2)
Indirect Bilirubin: 14.9 mg/dL
Total Bilirubin: 34 mg/dL (ref 0.3–1.2)
Total Protein: 6.4 g/dL — ABNORMAL LOW (ref 6.5–8.1)

## 2021-10-06 LAB — CBC
HCT: 23.8 % — ABNORMAL LOW (ref 36.0–46.0)
Hemoglobin: 8.5 g/dL — ABNORMAL LOW (ref 12.0–15.0)
MCH: 34.8 pg — ABNORMAL HIGH (ref 26.0–34.0)
MCHC: 35.7 g/dL (ref 30.0–36.0)
MCV: 97.5 fL (ref 80.0–100.0)
Platelets: 199 10*3/uL (ref 150–400)
RBC: 2.44 MIL/uL — ABNORMAL LOW (ref 3.87–5.11)
RDW: 32.2 % — ABNORMAL HIGH (ref 11.5–15.5)
WBC: 19.7 10*3/uL — ABNORMAL HIGH (ref 4.0–10.5)
nRBC: 2.7 % — ABNORMAL HIGH (ref 0.0–0.2)

## 2021-10-06 LAB — BASIC METABOLIC PANEL
Anion gap: 15 (ref 5–15)
Anion gap: 11 (ref 5–15)
BUN: 11 mg/dL (ref 6–20)
BUN: 9 mg/dL (ref 6–20)
CO2: 20 mmol/L — ABNORMAL LOW (ref 22–32)
CO2: 24 mmol/L (ref 22–32)
Calcium: 8.4 mg/dL — ABNORMAL LOW (ref 8.9–10.3)
Calcium: 8.2 mg/dL — ABNORMAL LOW (ref 8.9–10.3)
Chloride: 98 mmol/L (ref 98–111)
Chloride: 100 mmol/L (ref 98–111)
Creatinine, Ser: 1.18 mg/dL — ABNORMAL HIGH (ref 0.44–1.00)
Creatinine, Ser: 1.06 mg/dL — ABNORMAL HIGH (ref 0.44–1.00)
GFR, Estimated: 53 mL/min — ABNORMAL LOW (ref >60)
GFR, Estimated: >60 mL/min (ref >60)
Glucose, Bld: 123 mg/dL — ABNORMAL HIGH (ref 70–99)
Glucose, Bld: 103 mg/dL — ABNORMAL HIGH (ref 70–99)
Potassium: 3.2 mmol/L — ABNORMAL LOW (ref 3.5–5.1)
Potassium: 3.5 mmol/L (ref 3.5–5.1)
Sodium: 133 mmol/L — ABNORMAL LOW (ref 135–145)
Sodium: 135 mmol/L (ref 135–145)

## 2021-10-06 LAB — GLUCOSE, CAPILLARY
Glucose-Capillary: 123 mg/dL — ABNORMAL HIGH (ref 70–99)
Glucose-Capillary: 105 mg/dL — ABNORMAL HIGH (ref 70–99)
Glucose-Capillary: 74 mg/dL (ref 70–99)
Glucose-Capillary: 60 mg/dL — ABNORMAL LOW (ref 70–99)
Glucose-Capillary: 72 mg/dL (ref 70–99)
Glucose-Capillary: 84 mg/dL (ref 70–99)
Glucose-Capillary: 169 mg/dL — ABNORMAL HIGH (ref 70–99)
Glucose-Capillary: 113 mg/dL — ABNORMAL HIGH (ref 70–99)

## 2021-10-06 LAB — LACTIC ACID, PLASMA
Lactic Acid, Venous: 4.2 mmol/L (ref 0.5–1.9)
Lactic Acid, Venous: 2.6 mmol/L (ref 0.5–1.9)
Lactic Acid, Venous: 2.6 mmol/L (ref 0.5–1.9)

## 2021-10-06 LAB — MAGNESIUM: Magnesium: 2.1 mg/dL (ref 1.7–2.4)

## 2021-10-06 MED ORDER — BISACODYL 5 MG PO TBEC
20.0000 mg | DELAYED_RELEASE_TABLET | Freq: Once | ORAL | Status: AC
  Administered 2021-10-06: 20 mg via ORAL
  Filled 2021-10-06: qty 4

## 2021-10-06 MED ORDER — POTASSIUM CHLORIDE 10 MEQ/100ML IV SOLN
10.0000 meq | INTRAVENOUS | Status: AC
  Administered 2021-10-06 (×4): 10 meq via INTRAVENOUS
  Filled 2021-10-06 (×4): qty 100

## 2021-10-06 MED ORDER — SODIUM CHLORIDE 0.9 % IV SOLN: INTRAVENOUS | Status: DC

## 2021-10-06 NOTE — Care Management Important Message (Signed)
Important Message  Patient Details  Name: Judy Meyer MRN: [id number] Date of Birth: 1960/11/05   Medicare Important Message Given:  Yes     Bodin Gorka 10/06/2021, 1:26 PM

## 2021-10-06 NOTE — Progress Notes (Addendum)
Daily Rounding Note  10/06/2021, 12:30 PM  LOS: 3 days   SUBJECTIVE:   Chief complaint:   Acute alcoholic hepatitis  No nausea.  No BM's.  Abd pain persists.    OBJECTIVE:         Vital signs in last 24 hours:    Temp:  [97.4 F (36.3 C)-97.9 F (36.6 C)] 97.6 F (36.4 C) (02/28 1149) Pulse Rate:  [79-93] 90 (02/28 1149) Resp:  [10-21] 17 (02/28 1149) BP: (96-117)/(44-71) 105/55 (02/28 1149) SpO2:  [92 %-100 %] 96 % (02/28 1149) Last BM Date : 10/05/21 Filed Weights   10/04/21 0337  Weight: 63.7 kg   General: Jaundiced, severely ill-appearing arousable but somnolent. Heart: RRR. Chest: No labored breathing or cough.  Lungs clear anteriorly. Abdomen: Tense, not protuberant.  Diffusely tender without guarding or rebound. Extremities: Lower extremity edema. Neuro/Psych:  slow responses and speech.  Not overtly confused.  Equivocal asterixis.  I   Lab Results: Recent Labs    10/04/21 0330 10/05/21 0111 10/06/21 0519  WBC 16.2* 15.4* 19.7*  HGB 9.2* 8.5* 8.5*  HCT 27.3* 25.2* 23.8*  PLT 172 197 199   BMET Recent Labs    10/05/21 2120 10/06/21 0058 10/06/21 0519  NA 132* 133* 135  K 3.0* 3.2* 3.5  CL 98 98 100  CO2 21* 20* 24  GLUCOSE 174* 123* 103*  BUN 12 11 9   CREATININE 1.28* 1.18* 1.06*  CALCIUM 8.2* 8.4* 8.2*     Recent Labs  Lab 09/20/2021 1808 10/04/21 0330 10/05/21 0111 10/05/21 0347 10/05/21 1137 10/05/21 1523 10/05/21 2120  AST  --  301* 354* 301* 391* 397* 365*  ALT  --  69* 83* 66* 85* 83* 90*  ALKPHOS  --  151* 154* 178* 140* 132* 136*  BILITOT  --  30.9* 34.4* 28.8* 34.4* QUANTITY NOT SUFFICIENT, UNABLE TO PERFORM TEST 34.0*  PROT  --  6.2* 6.5 6.2* 6.7 6.3* 6.4*  ALBUMIN  --  2.3* 2.3* 2.2* 2.5* 2.4* 2.3*  INR 1.6* 1.7* 1.9*  --   --   --   --     PT/INR Recent Labs    10/04/21 0330 10/05/21 0111  LABPROT 20.3* 21.7*  INR 1.7* 1.9*   Hepatitis Panel Recent  Labs    10/04/21 0021  HEPBSAG NON REACTIVE  HCVAB NON REACTIVE  HEPAIGM NON REACTIVE  HEPBIGM NON REACTIVE      Scheduled Meds:  (feeding supplement) PROSource Plus  30 mL Oral BID BM   chlorhexidine  15 mL Mouth Rinse BID   enoxaparin (LOVENOX) injection  40 mg Subcutaneous Q24H   feeding supplement  1 Container Oral TID BM   folic acid  1 mg Oral Daily   lactulose  20 g Oral TID   levETIRAcetam  500 mg Oral BID   levothyroxine  75 mcg Oral Q0600   mouth rinse  15 mL Mouth Rinse q12n4p   multivitamin with minerals  1 tablet Oral Daily   pantoprazole  40 mg Oral Q0600   prednisoLONE  40 mg Oral Daily   rifaximin  550 mg Oral BID   thiamine  100 mg Oral Daily   Or   thiamine  100 mg Intravenous Daily   Continuous Infusions:  sodium chloride 100 mL/hr at 10/06/21 0441   phytonadione (VITAMIN K) IV 5 mg (10/06/21 1136)   piperacillin-tazobactam (ZOSYN)  IV 3.375 g (10/06/21 0515)   PRN Meds:.HYDROmorphone (DILAUDID) injection,  hydrOXYzine, ondansetron (ZOFRAN) IV   ASSESMENT:   Longstanding alcoholic liver disease.  Superimposed acute alcoholic hepatitis.  Day 3 prednisolone.  Lilly score due 3/4.  DF score 71 2/27.  LFTs not signif improved though mostly downtrending.  T. bili stable at 34.  Alcohol use disorder, not in remission  2 weeks abdominal pain.  Imaging with gallbladder sludge, gallbladder wall thickening, 8.1 mm CBD.  Not convinced she has cholecystitis but empiric Zosyn in place.  Unable to locate any previous EGD or colonoscopy.  Reversal of blood flow in portal vein without obvious thrombosis.  Hepatic encephalopathy.  Not a new issue.  Ammonia level 106.  Lactulose, Rifaximin in place.   Coagulopathy.  Vitamin K 10 mg IV on 2/27.  Day 1/3 oral vitamin K.  INR not checked today.    Metabolic acidosis.  Improved, not resolved.  >9... 2.6.    [place] anemia.  No overt bleeding.    Hyponatremia.  Resolved.   PLAN      Continue supportive care.  Advance  to soft diet.  Add dose of dulcolax but leave lactulose in place.  Follow CMET, INR.    Judy Meyer  10/06/2021, 12:30 PM Phone [phone]     [place] Attending   She seems about the same to slightly better in some respects. INR up slightly, mental status same, bili same. Would not expect much liver chemistry improvement yet. Key marker will be what is bili at about a week (also can use Lille score).   Gatha Mayer, MD, [facility] 10/06/2021 2:48 PM

## 2021-10-06 NOTE — Progress Notes (Signed)
HD#3 SUBJECTIVE:  Patient Summary: Judy Meyer is a 61 y.o. with a pertinent PMH of alcohol associated hepatitis, hypothyroidism, hypertension, seizures, anemia, hyperlipidemia and alcohol use disorder who presented with worsening of her jaundice, nausea, vomiting and admitted for acute alcoholic hepatitis.   Overnight Events: None  Interim History: Patient remains somnolent and answers some questions with yes, no, or okay. She winces with palpation of RUQ. Chart review reveals 3 BM yesterday.  OBJECTIVE:  Vital Signs: Vitals:   10/06/21 0500 10/06/21 0600 10/06/21 0700 10/06/21 0753  BP:  97/71  (!) 96/59  Pulse: 84   93  Resp: 18 20 (!) 21 20  Temp:  97.9 F (36.6 C)  97.8 F (36.6 C)  TempSrc:  Oral  Oral  SpO2: 96%   95%  Weight:      Height:       Supplemental O2: Room Air SpO2: 95 % O2 Flow Rate (L/min): 2 L/min FiO2 (%): 21 %  Filed Weights   10/04/21 0337  Weight: 63.7 kg     Intake/Output Summary (Last 24 hours) at 10/06/2021 0756 Last data filed at 10/05/2021 0815 Gross per 24 hour  Intake 120 ml  Output --  Net 120 ml   Net IO Since Admission: 2,739.13 mL [10/06/21 0756]  Physical Exam: Constitutional:Chronically-ill appearing female resting in bed. No acute distress. Eyes:Icteric sclera.  Cardio:Split S2. Normal rate and rhythm. Pulm:Normal work of breathing on room air. Abdomen:Distended, RUQ tenderness.  HYI:FOYDXAJO for extremity edema. Skin:Warm, dry, jaundiced. Several new bruises from serial lab draws. Neuro:Quite somnolent with minimal waking and response to questions.   Patient Lines/Drains/Airways Status     Active Line/Drains/Airways     Name Placement date Placement time Site Days   Peripheral IV 09/24/2021 24 G Anterior;Left Forearm 09/16/2021  1707  Forearm  3   Peripheral IV 09/15/2021 20 G Anterior;Right Forearm 09/10/2021  1810  Forearm  3             ASSESSMENT/PLAN:  Assessment: Principal Problem:   Alcoholic  hepatitis Active Problems:   Hypokalemia   Hyperbilirubinemia   Acidosis, metabolic   Leukocytosis   Lactic acidosis   Acute liver failure with hepatic coma ([place])   Plan: Judy Meyer is a 61 y.o. with a pertinent PMH of alcohol associated hepatitis, hypothyroidism, hypertension, seizures, anemia, hyperlipidemia and alcohol use disorder who presented with worsening of her jaundice, nausea, vomiting and admitted for acute alcoholic hepatitis.    Acute alcoholic hepatitis Acute cholecystitis Chart review reveals three BM recorded yesterday. Leukocytosis worsened today however she has been receiving steroid therapy. She has been afebrile and HD stable. Aminotransferases have waxed and waned though largely unchanged from severely elevated. Fortunately most recent lactic acid of 2.6, s/p starting NS 100 cc/h. RUQ tenderness persists on exam but it is more likely that clinical presentation is predominantly acute alcoholic hepatitis, possibly with a component of acute cholecystitis.  -GI following, appreciate their assistance             -Vitamin K IV day 2/4  -Dulcolax 20 mg PO x1 -Surgery following, appreciate their assistance             -Recommending continued GI workup and consideration of transfer back to primary institution where workup is occurring for liver transplant -Continue zosyn IV 3.375 g TID, day 3/7 -Prednisolone 40 mg daily -Lactulose 20 g 3 times daily -Rifaximin 550 mg BID -Thiamine 100 mg daily -Trend CMP, PT/INR   Persistent  anion gap metabolic acidosis IVF restarted this overnight given soft BP and up-trending LA, NS 100 cc/h. LA maintained at 2.6 over last two lab draws. Bicarb improved further to 24.  -NS 100 cc/h; consider transitioning to albumin 2/2 concerns for third spacing -Trend CMP -Avoid LR -Replete electrolytes as needed   High risk for alcohol withdrawal Alcohol Use Disorder Patient is high risk for severe alcohol withdrawal. Currently on CIWA  protocol without Ativan. Continue to monitor and if begins showing signs of withdrawal, can use ativan.   Hx of seizure disorder Continue keppra 500 mg bid.  Best Practice: Diet: Soft  IVF: Fluids: 0.9NS, Rate:  100 cc/h VTE: enoxaparin (LOVENOX) injection 40 mg Start: 09/22/2021 2206 SCDs Start: 09/25/2021 2027 Code: Full AB: Zosyn 3.375 g TID, day 3/7 DISPO: Anticipated discharge in 3-4 days pending Medical stability.  Signature: Farrel Gordon, D.O.  Internal Medicine Resident, PGY-1 [facility]  Pager: [phone] 7:56 AM, 10/06/2021   Please contact the on call pager after 5 pm and on weekends at [phone].

## 2021-10-07 DIAGNOSIS — K7010 Alcoholic hepatitis without ascites: Secondary | ICD-10-CM | POA: Diagnosis not present

## 2021-10-07 LAB — COMPREHENSIVE METABOLIC PANEL
ALT: 99 U/L — ABNORMAL HIGH (ref 0–44)
ALT: 100 U/L — ABNORMAL HIGH (ref 0–44)
AST: 359 U/L — ABNORMAL HIGH (ref 15–41)
AST: 370 U/L — ABNORMAL HIGH (ref 15–41)
Albumin: 2.1 g/dL — ABNORMAL LOW (ref 3.5–5.0)
Albumin: 2.1 g/dL — ABNORMAL LOW (ref 3.5–5.0)
Alkaline Phosphatase: 134 U/L — ABNORMAL HIGH (ref 38–126)
Alkaline Phosphatase: 145 U/L — ABNORMAL HIGH (ref 38–126)
Anion gap: 12 (ref 5–15)
Anion gap: 13 (ref 5–15)
BUN: 12 mg/dL (ref 6–20)
BUN: 12 mg/dL (ref 6–20)
CO2: 21 mmol/L — ABNORMAL LOW (ref 22–32)
CO2: 17 mmol/L — ABNORMAL LOW (ref 22–32)
Calcium: 7.9 mg/dL — ABNORMAL LOW (ref 8.9–10.3)
Calcium: 7.6 mg/dL — ABNORMAL LOW (ref 8.9–10.3)
Chloride: 102 mmol/L (ref 98–111)
Chloride: 104 mmol/L (ref 98–111)
Creatinine, Ser: 1.15 mg/dL — ABNORMAL HIGH (ref 0.44–1.00)
Creatinine, Ser: 1.32 mg/dL — ABNORMAL HIGH (ref 0.44–1.00)
GFR, Estimated: 55 mL/min — ABNORMAL LOW (ref >60)
GFR, Estimated: 47 mL/min — ABNORMAL LOW (ref >60)
Glucose, Bld: 98 mg/dL (ref 70–99)
Glucose, Bld: 118 mg/dL — ABNORMAL HIGH (ref 70–99)
Potassium: 2.7 mmol/L — CL (ref 3.5–5.1)
Potassium: 3.4 mmol/L — ABNORMAL LOW (ref 3.5–5.1)
Sodium: 135 mmol/L (ref 135–145)
Sodium: 134 mmol/L — ABNORMAL LOW (ref 135–145)
Total Bilirubin: 34.8 mg/dL (ref 0.3–1.2)
Total Bilirubin: 32.7 mg/dL (ref 0.3–1.2)
Total Protein: 5.7 g/dL — ABNORMAL LOW (ref 6.5–8.1)
Total Protein: 6 g/dL — ABNORMAL LOW (ref 6.5–8.1)

## 2021-10-07 LAB — GLUCOSE, CAPILLARY
Glucose-Capillary: 72 mg/dL (ref 70–99)
Glucose-Capillary: 95 mg/dL (ref 70–99)
Glucose-Capillary: 55 mg/dL — ABNORMAL LOW (ref 70–99)
Glucose-Capillary: 78 mg/dL (ref 70–99)
Glucose-Capillary: 90 mg/dL (ref 70–99)
Glucose-Capillary: 120 mg/dL — ABNORMAL HIGH (ref 70–99)
Glucose-Capillary: 97 mg/dL (ref 70–99)

## 2021-10-07 LAB — PROTIME-INR
INR: 2 — ABNORMAL HIGH (ref 0.8–1.2)
Prothrombin Time: 23 seconds — ABNORMAL HIGH (ref 11.4–15.2)

## 2021-10-07 MED ORDER — POTASSIUM CHLORIDE 10 MEQ/100ML IV SOLN: 10.0000 meq | INTRAVENOUS | Status: DC

## 2021-10-07 MED ORDER — POTASSIUM CHLORIDE 10 MEQ/100ML IV SOLN
10.0000 meq | INTRAVENOUS | Status: AC
  Administered 2021-10-07 (×8): 10 meq via INTRAVENOUS
  Filled 2021-10-07 (×8): qty 100

## 2021-10-07 MED ORDER — POTASSIUM CHLORIDE CRYS ER 20 MEQ PO TBCR: 40.0000 meq | EXTENDED_RELEASE_TABLET | Freq: Two times a day (BID) | ORAL | Status: DC

## 2021-10-07 MED ORDER — LACTULOSE 10 GM/15ML PO SOLN
30.0000 g | Freq: Three times a day (TID) | ORAL | Status: DC
  Administered 2021-10-07 – 2021-10-08 (×3): 30 g via ORAL
  Filled 2021-10-07 (×3): qty 45

## 2021-10-07 MED ORDER — DEXTROSE 50 % IV SOLN
INTRAVENOUS | Status: AC
Start: 2021-10-07 — End: 2021-10-07
  Filled 2021-10-07: qty 50

## 2021-10-07 NOTE — Progress Notes (Signed)
   Inpatient Rehab Admissions Coordinator :  Per therapy recommendations patient was screened for CIR candidacy by Lenny Fiumara RN MSN. Patient is not yet at a level to tolerate the intensity required to pursue a CIR admit . Patient may have the potential to progress to become a candidate. The CIR admissions team will follow and monitor for progress and place a Rehab Consult order if felt to be appropriate. Please contact me with any questions.  Mauri Temkin RN MSN Admissions Coordinator [phone]  

## 2021-10-07 NOTE — Progress Notes (Signed)
? ?HD#4 ?SUBJECTIVE:  ?Patient Summary: Judy Meyer is a 61 y.o. with a pertinent PMH of alcohol associated hepatitis, hypothyroidism, hypertension, seizures, anemia, hyperlipidemia and alcohol use disorder who presented with worsening of her jaundice, nausea, vomiting and admitted for acute alcoholic hepatitis.  ? ?Overnight Events: None ? ?Interim History: Patient is much more alert today and admits to feeling improved from a mentation standpoint.  She says that she thinks she has COVID because of the chills and some body aches.  She does not have any complaints regarding breathing and endorses pain in her right upper quadrant that makes it difficult for her to sit up.  Otherwise has no complaints.   ? ?OBJECTIVE:  ?Vital Signs: ?Vitals:  ? 10/06/21 1928 10/06/21 2320 10/07/21 0000 10/07/21 0448  ?BP: 106/64 (!) 103/53 115/62 (!) 105/59  ?Pulse: 87 87 80 92  ?Resp: 20 20  20   ?Temp: 98.3 ?F (36.8 ?C) 97.9 ?F (36.6 ?C)  98 ?F (36.7 ?C)  ?TempSrc: Oral Oral  Oral  ?SpO2: 100% 100%    ?Weight:      ?Height:      ? ?Supplemental O2: Nasal Cannula ?SpO2: 100 % ?O2 Flow Rate (L/min): 2 L/min ?FiO2 (%): 21 % ? ?Filed Weights  ? 10/04/21 0337  ?Weight: 63.7 kg  ? ? ?No intake or output data in the 24 hours ending 10/07/21 0736 ?Net IO Since Admission: 2,739.13 mL [10/07/21 0736] ? ?Physical Exam: ?Constitutional: Chronically ill-appearing female resting in bed.  No acute distress noted. ?Eyes: Icteric sclera. ?Cardio: Regular rate and rhythm.  No murmur, rubs, gallops. ?Pulm: Clear to auscultation bilaterally. ?Abdomen: RUQ tenderness. ?MSK: Bilateral 1+ lower extremity edema. ?Skin: Warm, dry, jaundice. ?Neuro: Awake and alert.  Does not fall back to sleep during exam as she had in previous days.  Does seem to have some continued confusion. ? ?Patient Lines/Drains/Airways Status   ? ? Active Line/Drains/Airways   ? ? Name Placement date Placement time Site Days  ? Peripheral IV 09/24/2021 24 G Anterior;Left Forearm  09/11/2021  1707  Forearm  4  ? Peripheral IV 10/02/2021 20 G Anterior;Right Forearm 09/17/2021  1810  Forearm  4  ? ?  ?  ? ?  ? ? ? ?ASSESSMENT/PLAN:  ?Assessment: ?Principal Problem: ?  Alcoholic hepatitis ?Active Problems: ?  Hypokalemia ?  Hyperbilirubinemia ?  Acidosis, metabolic ?  Leukocytosis ?  Lactic acidosis ?  Acute liver failure with hepatic coma ([place]) ? ? ?Plan: ?Judy Meyer is a 61 y.o. with a pertinent PMH of alcohol associated hepatitis, hypothyroidism, hypertension, seizures, anemia, hyperlipidemia and alcohol use disorder who presented with worsening of her jaundice, nausea, vomiting and admitted for acute alcoholic hepatitis.  ?  ?Acute alcoholic hepatitis ?Acute cholecystitis ?Chart review reveals 3 bowel movements over the last 24 hours, however only two yesterday.  Overnight CMP resulted with potassium low at 2.7 and she was initiated on IV potassium repletion, 10 mEq per hour x8 hours.  Aminotransferases largely unchanged from 2 days ago, total bilirubin with small increase to 34.8 from 34.0 at that time.  She has been afebrile and hemodynamically stable.  PT/INR 23.0/2.0.  RUQ tenderness persists on exam but it is more likely that clinical presentation is predominantly acute alcoholic hepatitis, possibly with a component of acute cholecystitis.  ?-GI following, appreciate their assistance ?            -Vitamin K IV day 3/4 ?-Surgery following, appreciate their assistance ?            -  Recommending continued GI workup and consideration of transfer back to primary institution where workup is occurring for liver transplant ?-Continue zosyn IV 3.375 g TID, day 4/7 ?-Prednisolone 40 mg daily, day 4; calculate Lille score after 7 days of therapy ?-Increase lactulose to 30 g 3 times daily, goal of 3 BMs per day ?-Rifaximin 550 mg BID ?-Thiamine 100 mg daily ?-Trend CMP, PT/INR ?  ?Persistent anion gap metabolic acidosis ?Patient received IVF resuscitation with normal saline 0 2/28.  Bicarbonate  decreased from 24 to 21, though this range has been consistent throughout admission. ?-Trend CMP ?-Avoid LR ?-Replete electrolytes as needed ?  ?High risk for alcohol withdrawal ?Alcohol Use Disorder ?Patient is high risk for severe alcohol withdrawal. Currently on CIWA protocol without Ativan. Continue to monitor and if begins showing signs of withdrawal, can use ativan. ?  ?Hx of seizure disorder ?Continue keppra 500 mg bid. ? ?Best Practice: ?Diet: Soft  ?IVF: Fluids: 0.9NS, Rate:  100 cc/h ?VTE: enoxaparin (LOVENOX) injection 40 mg Start: 09/20/2021 2206 ?SCDs Start: 09/19/2021 2027 ?Code: Full ?AB: Zosyn 3.375 g TID, day 3/7 ?DISPO: Anticipated discharge in 3-4 days pending Medical stability. ? ?Signature: ?Farrel Gordon, D.O.  ?Internal Medicine Resident, PGY-1 ?[facility]  ?Pager: [phone] ?7:36 AM, 10/07/2021  ? ?Please contact the on call pager after 5 pm and on weekends at [phone].  ?

## 2021-10-07 NOTE — Evaluation (Signed)
Physical Therapy Evaluation ?Patient Details ?Name: Judy Meyer ?MRN: [id number] ?DOB: September 12, 1960 ?Today's Date: 10/07/2021 ? ?History of Present Illness ? Judy Meyer is a 61 y.o. admitted 2/25 who presented with worsening of her jaundice, nausea, vomiting and admitted for acute alcoholic hepatitis.  PMH of alcohol associated hepatitis, hypothyroidism, hypertension, seizures, anemia, hyperlipidemia and alcohol use disorder  ?Clinical Impression ? Pt admitted with above diagnosis. Pt was unable to sit EOB due to dizziness and nausea and refusal to stay sitting.Pt lethargic as well and BP somewhat soft. Limited eval due to pt confusion and not tolerating movement well. Will follow acutely.  Pt currently with functional limitations due to the deficits listed below (see PT Problem List). Pt will benefit from skilled PT to increase their independence and safety with mobility to allow discharge to the venue listed below.      ?   ? ?Recommendations for follow up therapy are one component of a multi-disciplinary discharge planning process, led by the attending physician.  Recommendations may be updated based on patient status, additional functional criteria and insurance authorization. ? ?Follow Up Recommendations Acute inpatient rehab (3hours/day) ? ?  ?Assistance Recommended at Discharge Frequent or constant Supervision/Assistance  ?Patient can return home with the following ? A lot of help with walking and/or transfers;A lot of help with bathing/dressing/bathroom ? ?  ?Equipment Recommendations None recommended by PT  ?Recommendations for Other Services ?    ?  ?Functional Status Assessment Patient has had a recent decline in their functional status and demonstrates the ability to make significant improvements in function in a reasonable and predictable amount of time.  ? ?  ?Precautions / Restrictions Precautions ?Precautions: Fall ?Restrictions ?Weight Bearing Restrictions: No  ? ?  ? ?Mobility ? Bed  Mobility ?Overal bed mobility: Needs Assistance ?Bed Mobility: Rolling, Sidelying to Sit ?Rolling: Min assist ?Sidelying to sit: Max assist, HOB elevated ?  ?  ?  ?General bed mobility comments: Pt rolled to her side and attempted to sit up but once almost to sitting, laid back down and stated she was dizzy and nauseated.  VSS wtih BP slightly soft at 103/56.  RR to 45 with sitting as well.  Pt appeared anxious as well.  Assist pt back to bed and placed bed in chair position.  Pt was able to pull herself to sitting with bil rails and min assist. ?  ? ?Transfers ?  ?  ?  ?  ?  ?  ?  ?  ?  ?General transfer comment: NT today ?  ? ?Ambulation/Gait ?  ?  ?  ?  ?  ?  ?  ?  ? ?Stairs ?  ?  ?  ?  ?  ? ?Wheelchair Mobility ?  ? ?Modified Rankin (Stroke Patients Only) ?  ? ?  ? ?Balance Overall balance assessment: Needs assistance ?Sitting-balance support: Bilateral upper extremity supported, Feet supported ?Sitting balance-Leahy Scale: Zero ?Sitting balance - Comments: could not sit EOB ?Postural control: Posterior lean ?  ?  ?  ?  ?  ?  ?  ?  ?  ?  ?  ?  ?  ?  ?   ? ? ? ?Pertinent Vitals/Pain Pain Assessment ?Pain Assessment: Faces ?Faces Pain Scale: Hurts even more ?Pain Location: generalized ?Pain Descriptors / Indicators: Aching ?Pain Intervention(s): Limited activity within patient's tolerance, Monitored during session, Repositioned  ? ? ?Home Living Family/patient expects to be discharged to:: Private residence ?Living Arrangements: Other (Comment) (boyfriend) ?  Available Help at Discharge: Friend(s);Available 24 hours/day ?Type of Home: House ?Home Access: Level entry ?  ?  ?  ?Home Layout: Two level ?Home Equipment: Conservation officer, nature (2 wheels) ?Additional Comments: Pt states she has wheelchair but unsure if this is accurate  ?  ?Prior Function Prior Level of Function : Needs assist ?  ?  ?  ?  ?  ?  ?Mobility Comments: Pt stated she walked but then said she used wheelchair PTA.  No family present ?  ?  ? ? ?Hand  Dominance  ? Dominant Hand: Right ? ?  ?Extremity/Trunk Assessment  ? Upper Extremity Assessment ?Upper Extremity Assessment: Defer to OT evaluation ?  ? ?Lower Extremity Assessment ?Lower Extremity Assessment: Generalized weakness ?  ? ?   ?Communication  ? Communication: No difficulties  ?Cognition Arousal/Alertness: Lethargic, Suspect due to medications ?Behavior During Therapy: Flat affect ?Overall Cognitive Status: Difficult to assess ?  ?  ?  ?  ?  ?  ?  ?  ?  ?  ?  ?  ?  ?  ?  ?  ?General Comments: Pt not able to answer questions appropriately. ?  ?  ? ?  ?General Comments   ? ?  ?Exercises General Exercises - Lower Extremity ?Heel Slides: AAROM, Both, 5 reps, Supine ?Straight Leg Raises: AAROM, Both, 5 reps, Supine  ? ?Assessment/Plan  ?  ?PT Assessment Patient needs continued PT services  ?PT Problem List Decreased activity tolerance;Decreased balance;Decreased mobility;Decreased knowledge of use of DME;Decreased safety awareness;Decreased knowledge of precautions ? ?   ?  ?PT Treatment Interventions DME instruction;Gait training;Functional mobility training;Therapeutic activities;Therapeutic exercise;Balance training;Patient/family education   ? ?PT Goals (Current goals can be found in the Care Plan section)  ?Acute Rehab PT Goals ?Patient Stated Goal: to go home ?PT Goal Formulation: With patient ?Time For Goal Achievement: 10/21/21 ?Potential to Achieve Goals: Good ? ?  ?Frequency Min 3X/week ?  ? ? ?Co-evaluation   ?  ?  ?  ?  ? ? ?  ?AM-PAC PT "6 Clicks" Mobility  ?Outcome Measure Help needed turning from your back to your side while in a flat bed without using bedrails?: A Little ?Help needed moving from lying on your back to sitting on the side of a flat bed without using bedrails?: Total ?Help needed moving to and from a bed to a chair (including a wheelchair)?: Total ?Help needed standing up from a chair using your arms (e.g., wheelchair or bedside chair)?: Total ?Help needed to walk in hospital  room?: Total ?Help needed climbing 3-5 steps with a railing? : Total ?6 Click Score: 8 ? ?  ?End of Session Equipment Utilized During Treatment: Gait belt ?Activity Tolerance: Patient limited by fatigue;Patient limited by lethargy ?Patient left: in bed;with call bell/phone within reach;with bed alarm set ?Nurse Communication: Mobility status ?PT Visit Diagnosis: Muscle weakness (generalized) (M62.81) ?  ? ?Time: 7342-8768 ?PT Time Calculation (min) (ACUTE ONLY): 17 min ? ? ?Charges:   PT Evaluation ?$PT Eval Moderate Complexity: 1 Mod ?  ?  ?   ? ? ?Marquett Bertoli M,PT ?Acute Rehab Services ?[phone] ?[phone] (pager)  ? ?Alvira Philips ?10/07/2021, 2:04 PM ? ?

## 2021-10-07 NOTE — Progress Notes (Signed)
Hypoglycemic Event ? ?CBG: 55 ? ?Treatment: 4 oz juice/soda ? ?Symptoms: None ? ?Follow-up CBG: GBMS:1115 CBG Result:78 ? ?Possible Reasons for Event: Inadequate meal intake ? ?Comments/MD notified: MD Farrel Gordon ? ? ? ?Peggye Form ? ? ?

## 2021-10-07 DEATH — deceased

## 2021-10-08 DIAGNOSIS — K7010 Alcoholic hepatitis without ascites: Secondary | ICD-10-CM | POA: Diagnosis not present

## 2021-10-08 DIAGNOSIS — R932 Abnormal findings on diagnostic imaging of liver and biliary tract: Secondary | ICD-10-CM

## 2021-10-08 LAB — CBC
HCT: 26.3 % — ABNORMAL LOW (ref 36.0–46.0)
Hemoglobin: 8.9 g/dL — ABNORMAL LOW (ref 12.0–15.0)
MCH: 35.2 pg — ABNORMAL HIGH (ref 26.0–34.0)
MCHC: 33.8 g/dL (ref 30.0–36.0)
MCV: 104 fL — ABNORMAL HIGH (ref 80.0–100.0)
Platelets: 217 10*3/uL (ref 150–400)
RBC: 2.53 MIL/uL — ABNORMAL LOW (ref 3.87–5.11)
RDW: 34.8 % — ABNORMAL HIGH (ref 11.5–15.5)
WBC: 30.8 10*3/uL — ABNORMAL HIGH (ref 4.0–10.5)
nRBC: 4.4 % — ABNORMAL HIGH (ref 0.0–0.2)

## 2021-10-08 LAB — GLUCOSE, CAPILLARY
Glucose-Capillary: 102 mg/dL — ABNORMAL HIGH (ref 70–99)
Glucose-Capillary: 103 mg/dL — ABNORMAL HIGH (ref 70–99)
Glucose-Capillary: 106 mg/dL — ABNORMAL HIGH (ref 70–99)
Glucose-Capillary: 113 mg/dL — ABNORMAL HIGH (ref 70–99)
Glucose-Capillary: 69 mg/dL — ABNORMAL LOW (ref 70–99)
Glucose-Capillary: 76 mg/dL (ref 70–99)
Glucose-Capillary: 91 mg/dL (ref 70–99)
Glucose-Capillary: 96 mg/dL (ref 70–99)

## 2021-10-08 LAB — COMPREHENSIVE METABOLIC PANEL
ALT: 107 U/L — ABNORMAL HIGH (ref 0–44)
AST: 387 U/L — ABNORMAL HIGH (ref 15–41)
Albumin: 1.9 g/dL — ABNORMAL LOW (ref 3.5–5.0)
Alkaline Phosphatase: 152 U/L — ABNORMAL HIGH (ref 38–126)
Anion gap: 14 (ref 5–15)
BUN: 13 mg/dL (ref 6–20)
CO2: 20 mmol/L — ABNORMAL LOW (ref 22–32)
Calcium: 7.5 mg/dL — ABNORMAL LOW (ref 8.9–10.3)
Chloride: 104 mmol/L (ref 98–111)
Creatinine, Ser: 1.33 mg/dL — ABNORMAL HIGH (ref 0.44–1.00)
GFR, Estimated: 46 mL/min — ABNORMAL LOW (ref >60)
Glucose, Bld: 87 mg/dL (ref 70–99)
Potassium: 3.1 mmol/L — ABNORMAL LOW (ref 3.5–5.1)
Sodium: 138 mmol/L (ref 135–145)
Total Bilirubin: 33.8 mg/dL (ref 0.3–1.2)
Total Protein: 5.7 g/dL — ABNORMAL LOW (ref 6.5–8.1)

## 2021-10-08 LAB — PROTIME-INR
INR: 2.5 — ABNORMAL HIGH (ref 0.8–1.2)
Prothrombin Time: 26.9 seconds — ABNORMAL HIGH (ref 11.4–15.2)

## 2021-10-08 LAB — LACTIC ACID, PLASMA: Lactic Acid, Venous: 2.4 mmol/L (ref 0.5–1.9)

## 2021-10-08 LAB — CULTURE, BLOOD (ROUTINE X 2): Culture: NO GROWTH

## 2021-10-08 MED ORDER — POTASSIUM CHLORIDE CRYS ER 20 MEQ PO TBCR
40.0000 meq | EXTENDED_RELEASE_TABLET | Freq: Once | ORAL | Status: AC
Start: 2021-10-08 — End: 2021-10-08
  Administered 2021-10-08: 40 meq via ORAL
  Filled 2021-10-08: qty 2

## 2021-10-08 MED ORDER — VITAMIN K1 10 MG/ML IJ SOLN
10.0000 mg | Freq: Every day | INTRAMUSCULAR | Status: DC
  Administered 2021-10-08 – 2021-10-09 (×2): 10 mg via SUBCUTANEOUS
  Filled 2021-10-08 (×2): qty 1

## 2021-10-08 MED ORDER — LACTULOSE 10 GM/15ML PO SOLN
20.0000 g | Freq: Three times a day (TID) | ORAL | Status: DC
  Administered 2021-10-08 – 2021-10-11 (×6): 20 g via ORAL
  Filled 2021-10-08 (×8): qty 30

## 2021-10-08 MED ORDER — ENSURE ENLIVE PO LIQD
237.0000 mL | Freq: Two times a day (BID) | ORAL | Status: DC
  Administered 2021-10-08 – 2021-10-10 (×4): 237 mL via ORAL

## 2021-10-08 NOTE — Evaluation (Signed)
Occupational Therapy Evaluation Patient Details Name: Judy Meyer MRN: [id number] DOB: March 30, 1961 Today's Date: 10/08/2021   History of Present Illness Judy Meyer is a 61 y.o. admitted 2/25 who presented with worsening of her jaundice, nausea, vomiting and admitted for acute alcoholic hepatitis.  PMH of alcohol associated hepatitis, hypothyroidism, hypertension, seizures, anemia, hyperlipidemia and alcohol use disorder   Clinical Impression   PTA, pt lives with boyfriend, typically Independent with ADLs/mobility and shares IADLs with significant other. Pt presents now with diagnoses above and deficits in strength, standing balance, endurance and cognition. Pt requires overall Min A for UB ADLs, Mod A for LB ADLs, and Min A for BSC transfers this AM. Pt benefits from safety cues and increased time to initiate tasks. Pt denies dizziness, nausea and BP soft but stable throughout session. Pt reports boyfriend works (need to verify) and will need to maximize strength and independence for daily tasks prior to return home. Recommend AIR based on current presentation, anticipate good progress towards established OT goals.      Recommendations for follow up therapy are one component of a multi-disciplinary discharge planning process, led by the attending physician.  Recommendations may be updated based on patient status, additional functional criteria and insurance authorization.   Follow Up Recommendations  Acute inpatient rehab (3hours/day)    Assistance Recommended at Discharge Frequent or constant Supervision/Assistance  Patient can return home with the following A lot of help with bathing/dressing/bathroom;Assistance with cooking/housework;Direct supervision/assist for medications management;Direct supervision/assist for financial management;Assist for transportation    Functional Status Assessment  Patient has had a recent decline in their functional status and demonstrates the ability to  make significant improvements in function in a reasonable and predictable amount of time.  Equipment Recommendations  BSC/3in1    Recommendations for Other Services Rehab consult     Precautions / Restrictions Precautions Precautions: Fall Restrictions Weight Bearing Restrictions: No      Mobility Bed Mobility Overal bed mobility: Needs Assistance Bed Mobility: Sidelying to Sit, Sit to Supine   Sidelying to sit: Min assist   Sit to supine: Supervision   General bed mobility comments: Already in sidelying, Min A to lift trunk and scoot EOB. Pt assisting with task though increased time required. able to return to supine without assist    Transfers Overall transfer level: Needs assistance Equipment used: 1 person hand held assist Transfers: Sit to/from Stand, Bed to chair/wheelchair/BSC Sit to Stand: Mod assist     Step pivot transfers: Min assist     General transfer comment: Initially Mod A to stand from bedside with handheld assist, progressing to Min A from [facility] and pushing from armrests. Cues for sequencing and maintaining balance to step to/from [facility]      Balance Overall balance assessment: Needs assistance Sitting-balance support: Bilateral upper extremity supported, Feet supported, No upper extremity supported Sitting balance-Leahy Scale: Fair     Standing balance support: Single extremity supported, During functional activity Standing balance-Leahy Scale: Poor Standing balance comment: reliant on UE support in standing                           ADL either performed or assessed with clinical judgement   ADL Overall ADL's : Needs assistance/impaired Eating/Feeding: Set up;Sitting;Bed level   Grooming: Set up;Sitting   Upper Body Bathing: Minimal assistance;Sitting   Lower Body Bathing: Moderate assistance;Sit to/from stand   Upper Body Dressing : Minimal assistance;Sitting   Lower Body Dressing: Maximal  assistance;Sit to/from stand Lower  Body Dressing Details (indicate cue type and reason): to don socks sitting EOB, difficulty safely reaching Toilet Transfer: Minimal assistance;Stand-pivot;BSC/3in1 Toilet Transfer Details (indicate cue type and reason): cues for sequencing, handheld assist and pt reaching to [facility] armrest, bowel urgency Toileting- Clothing Manipulation and Hygiene: Moderate assistance;Sitting/lateral lean;Sit to/from stand Toileting - Clothing Manipulation Details (indicate cue type and reason): assist for posterior hygiene. pt able to assist with clothing mgmt seated on BSC       General ADL Comments: Limited by weakness, standing balance deficits. When asked if pt fatigued from these tasks, reports "only a little"     Vision Baseline Vision/History: 1 Wears glasses Ability to See in Adequate Light: 1 Impaired Patient Visual Report: No change from baseline Vision Assessment?: No apparent visual deficits     Perception     Praxis      Pertinent Vitals/Pain Pain Assessment Pain Assessment: No/denies pain     Hand Dominance Right   Extremity/Trunk Assessment Upper Extremity Assessment Upper Extremity Assessment: Generalized weakness   Lower Extremity Assessment Lower Extremity Assessment: Defer to PT evaluation   Cervical / Trunk Assessment Cervical / Trunk Assessment: Normal   Communication Communication Communication: No difficulties   Cognition Arousal/Alertness: Awake/alert Behavior During Therapy: Flat affect Overall Cognitive Status: No family/caregiver present to determine baseline cognitive functioning                                 General Comments: inconsistent answers from pt in comparison to PT eval but appeared more appropriate in conversation, following directions, etc. Endorses not sleeping much or feeling well so likely contributing     General Comments  BP soft but stable, continued diarrhea (reports this occurred overnight as well)    Exercises      Shoulder Instructions      Home Living Family/patient expects to be discharged to:: Private residence Living Arrangements: Other (Comment) (boyfriend) Available Help at Discharge: Friend(s);Available PRN/intermittently Type of Home: Apartment Home Access: Level entry           Bathroom Shower/Tub: Tub/shower unit   Bathroom Toilet: Standard     Home Equipment: Conservation officer, nature (2 wheels)   Additional Comments: Pt denies having wheelchair. When asked if she had RW, shower chair, pt reports "yes" but did not specify which DME when asked for clarification. Reports boyfriend works, unsure if other support is available      Prior Functioning/Environment Prior Level of Function : Independent/Modified Independent             Mobility Comments: reports no use of AD for mobility ADLs Comments: Reports independent with ADLs, shares IADLs with boyfriend        OT Problem List: Decreased strength;Decreased activity tolerance;Impaired balance (sitting and/or standing);Decreased cognition;Decreased knowledge of use of DME or AE      OT Treatment/Interventions: Self-care/ADL training;Therapeutic exercise;Energy conservation;DME and/or AE instruction;Therapeutic activities;Patient/family education;Balance training    OT Goals(Current goals can be found in the care plan section) Acute Rehab OT Goals Patient Stated Goal: feel better soon, get stronger to go home OT Goal Formulation: With patient Time For Goal Achievement: 10/22/21 Potential to Achieve Goals: Good  OT Frequency: Min 2X/week    Co-evaluation              AM-PAC OT "6 Clicks" Daily Activity     Outcome Measure Help from another person eating meals?: A Little Help from  another person taking care of personal grooming?: A Little Help from another person toileting, which includes using toliet, bedpan, or urinal?: A Lot Help from another person bathing (including washing, rinsing, drying)?: A Lot Help from another  person to put on and taking off regular upper body clothing?: A Little Help from another person to put on and taking off regular lower body clothing?: A Lot 6 Click Score: 15   End of Session Nurse Communication: Mobility status  Activity Tolerance: Patient tolerated treatment well Patient left: in bed;with call bell/phone within reach;with bed alarm set  OT Visit Diagnosis: Unsteadiness on feet (R26.81);Other abnormalities of gait and mobility (R26.89);Muscle weakness (generalized) (M62.81)                Time: 0710-0730 OT Time Calculation (min): 20 min Charges:  OT General Charges $OT Visit: 1 Visit OT Evaluation $OT Eval Moderate Complexity: 1 Mod  Malachy Chamber, OTR/L Acute Rehab Services Office: [phone]   Layla Maw 10/08/2021, 7:47 AM

## 2021-10-08 NOTE — Progress Notes (Addendum)
? ?HD#5 ?SUBJECTIVE:  ?Patient Summary: Judy Meyer is a 61 y.o. with a pertinent PMH of alcohol associated hepatitis, hypothyroidism, hypertension, seizures, anemia, hyperlipidemia and alcohol use disorder who presented with worsening of her jaundice, nausea, vomiting and admitted for acute alcoholic hepatitis.  ? ?Overnight Events: None ? ?Interim History: Patient reports diffuse abdominal pain. When asked about bruising over her hip, she says that she fell before coming in to the hospital and that her hip is painful. ? ?OBJECTIVE:  ?Vital Signs: ?Vitals:  ? 10/07/21 2326 10/08/21 0406 10/08/21 0832 10/08/21 1151  ?BP: 107/67 100/64 (!) 99/59 103/61  ?Pulse: 82 83 74 89  ?Resp: 20 20 20  (!) 22  ?Temp: 98.1 ?F (36.7 ?C) 98 ?F (36.7 ?C) 98.1 ?F (36.7 ?C) 98.4 ?F (36.9 ?C)  ?TempSrc: Oral Oral Oral Oral  ?SpO2: 99% 98% 97% 95%  ?Weight:      ?Height:      ? ?Supplemental O2: Room Air ?SpO2: 95 % ?O2 Flow Rate (L/min): 2 L/min ?FiO2 (%): 21 % ? ?Filed Weights  ? 10/04/21 0337  ?Weight: 63.7 kg  ? ? ? ?Intake/Output Summary (Last 24 hours) at 10/08/2021 1415 ?Last data filed at 10/08/2021 0600 ?Gross per 24 hour  ?Intake 1040 ml  ?Output --  ?Net 1040 ml  ? ?Net IO Since Admission: 3,899.13 mL [10/08/21 1415] ? ?Physical Exam: ?Constitutional: Chronically ill-appearing female resting in bed.  No acute distress noted. ?Eyes: Icteric sclera. ?Cardio: Regular rate and rhythm.  No murmur, rubs, gallops. ?Pulm: Clear to auscultation bilaterally. ?Abdomen: Diffuse tenderness to palpation with RUQ most affected. Suprapubic tenderness and fullness noted. ?MSK: Bilateral 1+ lower extremity edema. ?Skin: Cool distally, dry, jaundice. Ecchymosis over L hip, R flank around to RLQ. Stage 1 sacral ulcer. ?Neuro: Awake and alert, oriented to place and self.  ? ?Patient Lines/Drains/Airways Status   ? ? Active Line/Drains/Airways   ? ? Name Placement date Placement time Site Days  ? Peripheral IV 10/08/21 22 G 1" Left;Anterior  Forearm 10/08/21  0547  Forearm  less than 1  ? ?  ?  ? ?  ? ? ? ?ASSESSMENT/PLAN:  ?Assessment: ?Principal Problem: ?  Alcoholic hepatitis ?Active Problems: ?  Hypokalemia ?  Hyperbilirubinemia ?  Acidosis, metabolic ?  Leukocytosis ?  Lactic acidosis ?  Acute liver failure with hepatic coma ([place]) ? ? ?Plan: ?Judy Meyer is a 61 y.o. with a pertinent PMH of alcohol associated hepatitis, hypothyroidism, hypertension, seizures, anemia, hyperlipidemia and alcohol use disorder who presented with worsening of her jaundice, nausea, vomiting and admitted for acute alcoholic hepatitis.  ?  ?Acute alcoholic hepatitis ?Acute cholecystitis ?Patient had 9 BM with increase of lactulose to 30 mg TID 03/01. Hypokalemic today at 3.1. Aminotransferases with further increase, PT/INR up to 26.9/2.5 despite vitamin K therapy. Afebrile, hemodynamically stable. RUQ tenderness persists on exam however she also has diffuse abdominal pain today, and ecchymosis noted over abdomen and flank. Lactic acid stable at 2.4, WBC with large increase to 30.8 on morning labs. Concern for intraabdominal process higher at this time given diffuse abdominal pain, new leukocytosis, though she also has suprapubic fullness and pain so will keep acute cystitis on differential. Still feel that it is more likely that acute cholecystitis picture is predominantly acute alcoholic hepatitis, possibly with a component of acute cholecystitis. She would be very high risk if surgery was warranted. ?-GI following, appreciate their assistance ?            -Vitamin  K IV day 4/4 ? -Calculate Lille score 03/04 ?-Surgery following, appreciate their assistance ?            -Recommending continued GI workup and consideration of transfer back to primary institution where workup is occurring for liver transplant ?-Continue zosyn IV 3.375 g TID, day 5/7 ?-Prednisolone 40 mg daily, day 5; calculate Lille score after 7 days of therapy ?-Lactulose 20 g 3 times daily, goal of 3  BMs per day ?-Rifaximin 550 mg BID ?-Thiamine 100 mg daily ?-Trend CMP, PT/INR ?- CT abdomen today for further evaluation of abdomen, ? Abscess, ? Infected hematoma ?  ?Persistent anion gap metabolic acidosis ?Bicarbonate 20, has been stable throughout admission. Hypokalemia to 3.1. ?-Replete potassium ?-Trend CMP ?-Avoid LR ?-Replete electrolytes as needed ?  ?High risk for alcohol withdrawal ?Alcohol Use Disorder ?Patient is high risk for severe alcohol withdrawal. Currently on CIWA protocol without Ativan. Continue to monitor and if begins showing signs of withdrawal, can use ativan. ?  ?Hx of seizure disorder ?Continue keppra 500 mg bid. ? ?Best Practice: ?Diet: Soft  ?IVF: Fluids: 0.9NS, Rate:  100 cc/h ?VTE: enoxaparin (LOVENOX) injection 40 mg Start: 09/29/2021 2206 ?SCDs Start: 09/12/2021 2027 ?Code: Full ?AB: Zosyn 3.375 g TID, day 3/7 ?DISPO: Anticipated discharge in 3-4 days pending Medical stability. ? ?Signature: ?Farrel Gordon, D.O.  ?Internal Medicine Resident, PGY-1 ?[facility]  ?Pager: [phone] ?2:15 PM, 10/08/2021  ? ?Please contact the on call pager after 5 pm and on weekends at [phone]. ? ?

## 2021-10-08 NOTE — Progress Notes (Signed)
Physical Therapy Treatment ?Patient Details ?Name: Judy Meyer ?MRN: [id number] ?DOB: 1960-10-08 ?Today's Date: 10/08/2021 ? ? ?History of Present Illness Judy Meyer is a 61 y.o. admitted 2/25 who presented with worsening of her jaundice, nausea, vomiting and admitted for acute alcoholic hepatitis.  PMH of alcohol associated hepatitis, hypothyroidism, hypertension, seizures, anemia, hyperlipidemia and alcohol use disorder ? ?  ?PT Comments  ? ? Pt admitted with above diagnosis. Pt was able to ambulate with min assist with mod cues limited by soft BP with position changes.  Pt currently with functional limitations due to balance and endruance deficits. Pt will benefit from skilled PT to increase their independence and safety with mobility to allow discharge to the venue listed below.      ?Recommendations for follow up therapy are one component of a multi-disciplinary discharge planning process, led by the attending physician.  Recommendations may be updated based on patient status, additional functional criteria and insurance authorization. ? ?Follow Up Recommendations ? Acute inpatient rehab (3hours/day) ?  ?  ?Assistance Recommended at Discharge Frequent or constant Supervision/Assistance  ?Patient can return home with the following A lot of help with walking and/or transfers;A lot of help with bathing/dressing/bathroom ?  ?Equipment Recommendations ? None recommended by PT  ?  ?Recommendations for Other Services   ? ? ?  ?Precautions / Restrictions Precautions ?Precautions: Fall ?Precaution Comments: watch BP ?Restrictions ?Weight Bearing Restrictions: No  ?  ? ?Mobility ? Bed Mobility ?Overal bed mobility: Needs Assistance ?Bed Mobility: Supine to Sit ?Rolling: Min assist ?Sidelying to sit: Min assist ?Supine to sit: Min assist ?  ?  ?General bed mobility comments: Pt on bedpan.  Rolled to get bedpan out with min assist and use of rail, Min A to lift trunk and scoot EOB. Pt assisting with task though  increased time required. ?  ? ?Transfers ?Overall transfer level: Needs assistance ?Equipment used: 1 person hand held assist, None ?Transfers: Sit to/from Stand, Bed to chair/wheelchair/BSC ?Sit to Stand: Min assist, Mod assist ?  ?  ?  ?  ?  ?General transfer comment: Min A to stand from bedside with handheld assist.  Cues for sequencing and maintaining balance once up ?  ? ?Ambulation/Gait ?Ambulation/Gait assistance: Min assist, Mod assist ?Gait Distance (Feet): 45 Feet ?Assistive device: 1 person hand held assist, None ?Gait Pattern/deviations: Decreased stride length, Shuffle, Trunk flexed, Antalgic, Staggering left, Staggering right ?  ?Gait velocity interpretation: <1.31 ft/sec, indicative of household ambulator ?  ?General Gait Details: Pt unsteady gait walking around bed.  Pt takes small steps and reaching for objects or for therapist.Pt refused to use RW even though she would probably have done better. Pt c/o dizziness with BP soft at end of treatment. ? ? ?Stairs ?  ?  ?  ?  ?  ? ? ?Wheelchair Mobility ?  ? ?Modified Rankin (Stroke Patients Only) ?  ? ? ?  ?Balance Overall balance assessment: Needs assistance ?Sitting-balance support: Bilateral upper extremity supported, Feet supported, No upper extremity supported ?Sitting balance-Leahy Scale: Fair ?  ?  ?Standing balance support: Single extremity supported, During functional activity ?Standing balance-Leahy Scale: Poor ?Standing balance comment: reliant on UE support in standing ?  ?  ?  ?  ?  ?  ?  ?  ?  ?  ?  ?  ? ?  ?Cognition Arousal/Alertness: Awake/alert ?Behavior During Therapy: Flat affect ?Overall Cognitive Status: No family/caregiver present to determine baseline cognitive functioning ?  ?  ?  ?  ?  ?  ?  ?  ?  ?  ?  ?  ?  ?  ?  ?  ?  General Comments: more appropriate in conversation, following directions ?  ?  ? ?  ?Exercises   ? ?  ?General Comments General comments (skin integrity, edema, etc.): Supine 105/70, 108 bpm; Standing 87/60 HR 106  bpm; Once in recliner 111/61 with 102 bpm. ?  ?  ? ?Pertinent Vitals/Pain Pain Assessment ?Pain Assessment: Faces ?Faces Pain Scale: Hurts even more ?Pain Location: generalized ?Pain Descriptors / Indicators: Aching ?Pain Intervention(s): Limited activity within patient's tolerance, Monitored during session, Repositioned  ? ? ?Home Living   ?  ?  ?  ?  ?  ?  ?  ?  ?  ?   ?  ?Prior Function    ?  ?  ?   ? ?PT Goals (current goals can now be found in the care plan section) Acute Rehab PT Goals ?Patient Stated Goal: to go home ?Progress towards PT goals: Progressing toward goals ? ?  ?Frequency ? ? ? Min 3X/week ? ? ? ?  ?PT Plan Current plan remains appropriate  ? ? ?Co-evaluation   ?  ?  ?  ?  ? ?  ?AM-PAC PT "6 Clicks" Mobility   ?Outcome Measure ? Help needed turning from your back to your side while in a flat bed without using bedrails?: A Little ?Help needed moving from lying on your back to sitting on the side of a flat bed without using bedrails?: A Little ?Help needed moving to and from a bed to a chair (including a wheelchair)?: A Lot ?Help needed standing up from a chair using your arms (e.g., wheelchair or bedside chair)?: A Lot ?Help needed to walk in hospital room?: A Little ?Help needed climbing 3-5 steps with a railing? : Total ?6 Click Score: 14 ? ?  ?End of Session Equipment Utilized During Treatment: Gait belt ?Activity Tolerance: Patient limited by fatigue ?Patient left: with call bell/phone within reach;in chair;with chair alarm set ?Nurse Communication: Mobility status ?PT Visit Diagnosis: Muscle weakness (generalized) (M62.81) ?  ? ? ?Time: 1001-1025 ?PT Time Calculation (min) (ACUTE ONLY): 24 min ? ?Charges:  $Gait Training: 23-37 mins          ?          ? ?Judy Meyer,PT ?Acute Rehab Services ?[phone] ?[phone] (pager)  ? ? ?Judy Meyer ?10/08/2021, 2:05 PM ? ?

## 2021-10-08 NOTE — Hospital Course (Addendum)
Has abd pain ?Not eating much ? ?

## 2021-10-08 NOTE — Progress Notes (Addendum)
? ? ? ?Progress Note ?Hospital Day: 6 ? ?Chief Complaint:    alcoholic hepatitis ?   ? ?ASSESSMENT AND PLAN  ?# 61 yo female followed by [facility]. Last seen in July for acute Etoh hepatitis and possibly drug induced liver injury . Currently admitted with severe acute Etoh hepatitis, severe metabolic acidosis / hepatic encephalopathy/ upper abdominal pain and question of cholecystitis on [place].  ?--Bicarb 20. Persistent hypokalemia, K+ 3.1.  ?--Started Prednisolone on 2/26. [place] score due on 3/4. Tbili about the same,  32.7 >> 33.8 today ?--Worsening coagulopathy.  INR  .9 >> 2 >> 2.5 ?--Hepatic encephalopathy: On Rifaximin and lactulose. First time seeing her today but she answers most questions  ?appropriately, is alert and oriented. Only mild asterixis on exam ?--Reversal of blood flow in R portal vein and main portal vein. No evidence for thrombosis. ?--Leukocytosis, progressive. WBC 30K, up from 19.7 yesterday. On 4th day of Prednisolone. On Zosyn. Teaching Service getting abdominal CT  ?--Difficult to know if she has cholecystitis at this point. Gallbladder inflammation possibly related to severity of Etoh hepatitis with hepatic congestion and portal HTN.   On Zosyn, Surgery evaluated several days ago. Even if has cholecystitis she would be at increased risk for surgery in current decompensated state.  ?  ? ?# Etoh use disorder, actively drinking prior to discharge ? ? ? ? [facility] Attending  ? ?I have also evaluated the patient.  She is more alert today.  However the rising white count and abdominal pain and tenderness is of concern.  I think repeating a CT is sensible and we will await those results. ? ?I will administer vitamin K.  I am also concerned about the increasing INR though antibiotics and other illness may be coming into play that is not a good sign. ? ?Gatha Mayer, MD, Marval Regal ?[facility] ?10/08/2021 6:12 PM ? ? ?SUBJECTIVE  ? ?Feels terrible. Complains of " tummy pain",  loose stool ( on lactulose) ?    ? ? ?OBJECTIVE  ? ?  ? ?Scheduled inpatient medications:  ?? (feeding supplement) PROSource Plus  30 mL Oral BID BM  ?? chlorhexidine  15 mL Mouth Rinse BID  ?? enoxaparin (LOVENOX) injection  40 mg Subcutaneous Q24H  ?? feeding supplement  1 Container Oral TID BM  ?? folic acid  1 mg Oral Daily  ?? lactulose  30 g Oral TID  ?? levETIRAcetam  500 mg Oral BID  ?? levothyroxine  75 mcg Oral Q0600  ?? mouth rinse  15 mL Mouth Rinse q12n4p  ?? multivitamin with minerals  1 tablet Oral Daily  ?? pantoprazole  40 mg Oral Q0600  ?? prednisoLONE  40 mg Oral Daily  ?? rifaximin  550 mg Oral BID  ?? thiamine  100 mg Oral Daily  ? Or  ?? thiamine  100 mg Intravenous Daily  ? ?Continuous inpatient infusions:  ?? piperacillin-tazobactam (ZOSYN)  IV 3.375 g (10/08/21 8563)  ? ?PRN inpatient medications: HYDROmorphone (DILAUDID) injection, hydrOXYzine, ondansetron (ZOFRAN) IV ? ?Vital signs in last 24 hours: ?Temp:  [98 ?F (36.7 ?C)-98.6 ?F (37 ?C)] 98 ?F (36.7 ?C) (03/02 0406) ?Pulse Rate:  [82-103] 83 (03/02 0406) ?Resp:  [16-24] 20 (03/02 1497) ?BP: (99-107)/(49-67) 99/59 (03/02 0263) ?SpO2:  [92 %-100 %] 98 % (03/02 0406) ?Last BM Date : 10/07/21 ? ?Intake/Output Summary (Last 24 hours) at 10/08/2021 0849 ?Last data filed at 10/08/2021 0600 ?Gross per 24 hour  ?Intake 1160 ml  ?Output --  ?  Net 1160 ml  ? ? ? ?Physical Exam:  ?General: Alert female in NAD. Deeply jaundiced ?Heart:  Regular rate and rhythm. No lower extremity edema ?Pulmonary: Normal respiratory effort ?Abdomen: Soft, nondistended, mild epigastric / RUQ tenderness.  Normal bowel sounds.  ?Neurologic: Alert and oriented. Mild asterixis ?Psych:  Cooperative.  ? Danley Danker Weights  ? 10/04/21 3235  ?Weight: 63.7 kg  ? ? ?ntake/Output from previous day: ?03/01 0701 - 03/02 0700 ?In: 1160 [P.O.:360; IV Piggyback:800] ?Out: -  ?Intake/Output this shift: ?No intake/output data recorded. ? ? ?DIAGNOSTIC STUDIES THIS ADMISSION:  ?No results  found. ? ? ? ? ?Lab Results: ?Recent Labs  ?  10/06/21 ?5732 10/08/21 ?2025  ?WBC 19.7* 30.8*  ?HGB 8.5* 8.9*  ?HCT 23.8* 26.3*  ?PLT 199 217  ? ?BMET ?Recent Labs  ?  10/06/21 ?4270 10/07/21 ?0254 10/07/21 ?1427  ?NA 135 135 134*  ?K 3.5 2.7* 3.4*  ?CL 100 102 104  ?CO2 24 21* 17*  ?GLUCOSE 103* 98 118*  ?BUN 9 12 12   ?CREATININE 1.06* 1.15* 1.32*  ?CALCIUM 8.2* 7.9* 7.6*  ? ?LFT ?Recent Labs  ?  10/05/21 ?2120 10/07/21 ?0254 10/07/21 ?1427  ?PROT 6.4*   < > 6.0*  ?ALBUMIN 2.3*   < > 2.1*  ?AST 365*   < > 370*  ?ALT 90*   < > 100*  ?ALKPHOS 136*   < > 145*  ?BILITOT 34.0*   < > 32.7*  ?BILIDIR 19.1*  --   --   ?IBILI 14.9  --   --   ? < > = values in this interval not displayed.  ? ?PT/INR ?Recent Labs  ?  10/07/21 ?0254 10/08/21 ?0646  ?LABPROT 23.0* 26.9*  ?INR 2.0* 2.5*  ? ?Hepatitis Panel ?No results for input(s): HEPBSAG, HCVAB, HEPAIGM, HEPBIGM in the last 72 hours. ? ? ? ?Principal Problem: ?  Alcoholic hepatitis ?Active Problems: ?  Hypokalemia ?  Hyperbilirubinemia ?  Acidosis, metabolic ?  Leukocytosis ?  Lactic acidosis ?  Acute liver failure with hepatic coma ([place]) ? ? ? ? LOS: 5 days  ? ?Tye Savoy ,NP 10/08/2021, 8:49 AM ? ? ? ? ? ? ?

## 2021-10-08 NOTE — Progress Notes (Signed)
Hypoglycemic Event ? ?CBG: 69 ? ?Treatment: 4 oz juice/soda ? ?Symptoms: None ? ?Follow-up CBG: [id number] CBG Result:76 ? ?Possible Reasons for Event: Inadequate meal intake ? ? ? ? ?

## 2021-10-08 NOTE — Progress Notes (Signed)
Nutrition Follow-up ? ?DOCUMENTATION CODES:  ? ?Non-severe (moderate) malnutrition in context of chronic illness ? ?INTERVENTION:  ? ?Continue Multivitamin w/ minerals daily ?Discontinue 30 ml ProSource Plus BID, each supplement provides 100 kcals and 15 grams protein.  ?Ensure Enlive po BID, each supplement provides 350 kcal and 20 grams of protein. ?Discontinue Boost Breeze  ?Encourage good PO intake  ? ?NUTRITION DIAGNOSIS:  ? ?Moderate Malnutrition related to chronic illness (alcohol associated hepatitis) as evidenced by mild fat depletion, moderate muscle depletion, percent weight loss (18% weight loss in the last 6 months). - Ongoing ? ?GOAL:  ? ?Patient will meet greater than or equal to 90% of their needs - Progressing  ? ?MONITOR:  ? ?Supplement acceptance, Labs, Weight trends, Skin, PO intake ? ?REASON FOR ASSESSMENT:  ? ?Consult ?Assessment of nutrition requirement/status ? ?ASSESSMENT:  ? ?Pt is a 61 year old female with medical history significant of alcohol use disorder, HTN, hypothyroidism, seizures, anemia, hyperlipidemia, alcohol associated hepatitis followed by [facility] hepatology who presented to the ED with abdominal pain and jaundice and was admitted for alcoholic hepatitis. ? ?02/25 - diet advanced to clear liquids ?02/26 - diet advanced to full liquids  ?02/28 - diet advanced to SOFT  ? ?Pt resting in bed, very lethargic. Pt has a delayed response to RD questions slowly and uses one word answers typically.  ? ?Pt reports that she was not eating well. Reports that she is having nausea, vomiting, and diarrhea.  ?No meal completions have been recorded within EMR.  ? ?Spoke with RN outside of room. RN reports that pt is not eating well. That pt does not like ProSource supplement and that it takes a lot of convincing to even drink some of the [employer]. RD will trial a different supplement with pt.  ? ?Medications reviewed and include: Folic acid, Lactulose, MVI, Protonix, Prednisolone, Rifaximin,  Thiamine, IV antibiotics ?Labs reviewed: Potassium 3.1, 24 hr CBG 69-120 ? ?Diet Order:   ?Diet Order   ? ?       ?  DIET SOFT Room service appropriate? No; Fluid consistency: Thin  Diet effective now       ?  ? ?  ?  ? ?  ? ? ?EDUCATION NEEDS:  ? ?Not appropriate for education at this time ? ?Skin:  Skin Assessment: Reviewed RN Assessment ? ?Last BM:  3/2 - Type 6 ? ?Height:  ? ?Ht Readings from Last 1 Encounters:  ?10/04/21 5\' 6"  (1.676 m)  ? ? ?Weight:  ? ?Wt Readings from Last 1 Encounters:  ?10/04/21 63.7 kg  ? ? ?Ideal Body Weight:  59.1 kg ? ?BMI:  Body mass index is 22.67 kg/m?. ? ?Estimated Nutritional Needs:  ? ?Kcal:  2000-2200 ? ?Protein:  100-115 grams ? ?Fluid:  >/= 2 L ? ? ? ?Hermina Barters RD, LDN ?Clinical Dietitian ?See AMiON for contact information.  ? ?

## 2021-10-08 NOTE — Progress Notes (Signed)
?   10/08/21 1632  ?Assess: MEWS Score  ?Temp 98.3 ?F (36.8 ?C)  ?BP (!) 94/52  ?Pulse Rate 87  ?ECG Heart Rate 89  ?Resp (!) 22  ?SpO2 97 %  ?O2 Device Room Air  ?Assess: MEWS Score  ?MEWS Temp 0  ?MEWS Systolic 1  ?MEWS Pulse 0  ?MEWS RR 1  ?MEWS LOC 0  ?MEWS Score 2  ?MEWS Score Color Yellow  ?Assess: if the MEWS score is Yellow or Red  ?Were vital signs taken at a resting state? Yes  ?Focused Assessment No change from prior assessment  ?Early Detection of Sepsis Score *See Row Information* Low  ?MEWS guidelines implemented *See Row Information* Yes  ?Treat  ?Pain Scale 0-10  ?Pain Score 0  ?Take Vital Signs  ?Increase Vital Sign Frequency  Yellow: Q 2hr X 2 then Q 4hr X 2, if remains yellow, continue Q 4hrs  ?Escalate  ?MEWS: Escalate Yellow: discuss with charge nurse/RN and consider discussing with provider and RRT  ?Notify: Charge Nurse/RN  ?Name of Charge Nurse/RN Notified [place] RN  ?Date Charge Nurse/RN Notified 10/08/21  ?Time Charge Nurse/RN Notified 1634  ?Document  ?Progress note created (see row info) Yes  ? ? ?

## 2021-10-08 NOTE — Progress Notes (Signed)
Inpatient Rehabilitation Admissions Coordinator  ? ?I will place rehab consult order for full assessment of rehab venue options. ? ?Danne Baxter, RN, MSN ?Rehab Admissions Coordinator ?[phone] ?10/08/2021 11:34 AM ? ?

## 2021-10-08 NOTE — TOC Progression Note (Signed)
Transition of Care (TOC) - Progression Note  ? ? ?Patient Details  ?Name: Judy Meyer ?MRN: [id number] ?Date of Birth: 10-02-1960 ? ?Transition of Care (TOC) CM/SW Contact  ?Avielle Imbert D Veva Grimley, Student-Social Work ?Phone Number: ?10/08/2021, 11:42 AM ? ?Clinical Narrative:    ? ?CSW Intern visited patient at bedside to discuss discharge plans, however patient was very lethargic. CSW Intern will follow up later. ? ?  ?  ? ?Expected Discharge Plan and Services ?  ?  ?  ?  ?  ?                ?  ?  ?  ?  ?  ?  ?  ?  ?  ?  ? ? ?Social Determinants of Health (SDOH) Interventions ?  ? ?Readmission Risk Interventions ?No flowsheet data found. ? ?

## 2021-10-09 ENCOUNTER — Inpatient Hospital Stay (HOSPITAL_COMMUNITY): Payer: Medicare PPO

## 2021-10-09 DIAGNOSIS — E872 Acidosis, unspecified: Secondary | ICD-10-CM | POA: Diagnosis not present

## 2021-10-09 DIAGNOSIS — K7011 Alcoholic hepatitis with ascites: Secondary | ICD-10-CM | POA: Diagnosis not present

## 2021-10-09 DIAGNOSIS — K7031 Alcoholic cirrhosis of liver with ascites: Secondary | ICD-10-CM

## 2021-10-09 DIAGNOSIS — R932 Abnormal findings on diagnostic imaging of liver and biliary tract: Secondary | ICD-10-CM | POA: Diagnosis not present

## 2021-10-09 DIAGNOSIS — K7010 Alcoholic hepatitis without ascites: Secondary | ICD-10-CM | POA: Diagnosis not present

## 2021-10-09 DIAGNOSIS — K810 Acute cholecystitis: Secondary | ICD-10-CM | POA: Diagnosis not present

## 2021-10-09 DIAGNOSIS — G43909 Migraine, unspecified, not intractable, without status migrainosus: Secondary | ICD-10-CM

## 2021-10-09 DIAGNOSIS — F1010 Alcohol abuse, uncomplicated: Secondary | ICD-10-CM | POA: Diagnosis not present

## 2021-10-09 LAB — COMPREHENSIVE METABOLIC PANEL
ALT: 110 U/L — ABNORMAL HIGH (ref 0–44)
AST: 406 U/L — ABNORMAL HIGH (ref 15–41)
Albumin: 2 g/dL — ABNORMAL LOW (ref 3.5–5.0)
Alkaline Phosphatase: 206 U/L — ABNORMAL HIGH (ref 38–126)
Anion gap: 13 (ref 5–15)
BUN: 18 mg/dL (ref 6–20)
CO2: 16 mmol/L — ABNORMAL LOW (ref 22–32)
Calcium: 7.2 mg/dL — ABNORMAL LOW (ref 8.9–10.3)
Chloride: 103 mmol/L (ref 98–111)
Creatinine, Ser: 1.56 mg/dL — ABNORMAL HIGH (ref 0.44–1.00)
GFR, Estimated: 38 mL/min — ABNORMAL LOW (ref >60)
Glucose, Bld: 98 mg/dL (ref 70–99)
Potassium: 3.2 mmol/L — ABNORMAL LOW (ref 3.5–5.1)
Sodium: 132 mmol/L — ABNORMAL LOW (ref 135–145)
Total Bilirubin: 34.1 mg/dL (ref 0.3–1.2)
Total Protein: 6 g/dL — ABNORMAL LOW (ref 6.5–8.1)

## 2021-10-09 LAB — CULTURE, BLOOD (ROUTINE X 2)
Culture: NO GROWTH
Special Requests: ADEQUATE

## 2021-10-09 LAB — CBC
HCT: 26 % — ABNORMAL LOW (ref 36.0–46.0)
Hemoglobin: 8.7 g/dL — ABNORMAL LOW (ref 12.0–15.0)
MCH: 35.1 pg — ABNORMAL HIGH (ref 26.0–34.0)
MCHC: 33.5 g/dL (ref 30.0–36.0)
MCV: 104.8 fL — ABNORMAL HIGH (ref 80.0–100.0)
Platelets: 245 10*3/uL (ref 150–400)
RBC: 2.48 MIL/uL — ABNORMAL LOW (ref 3.87–5.11)
RDW: 34.8 % — ABNORMAL HIGH (ref 11.5–15.5)
WBC: 32.7 10*3/uL — ABNORMAL HIGH (ref 4.0–10.5)
nRBC: 6.9 % — ABNORMAL HIGH (ref 0.0–0.2)

## 2021-10-09 LAB — GLUCOSE, CAPILLARY
Glucose-Capillary: 94 mg/dL (ref 70–99)
Glucose-Capillary: 79 mg/dL (ref 70–99)
Glucose-Capillary: 75 mg/dL (ref 70–99)
Glucose-Capillary: 92 mg/dL (ref 70–99)
Glucose-Capillary: 104 mg/dL — ABNORMAL HIGH (ref 70–99)

## 2021-10-09 LAB — PROTIME-INR
INR: 2.5 — ABNORMAL HIGH (ref 0.8–1.2)
Prothrombin Time: 26.9 seconds — ABNORMAL HIGH (ref 11.4–15.2)

## 2021-10-09 LAB — PATHOLOGIST SMEAR REVIEW

## 2021-10-09 IMAGING — CT CT ABDOMEN W/ CM
2 of 4 series · 15 of 46 positions shown, 17 images · IV contrast (agent unspecified)
Comparison: [DATE]

CLINICAL DATA: Jaundice.

EXAM:
CT ABDOMEN WITH CONTRAST
TECHNIQUE: Multidetector CT imaging of the abdomen was performed using the
standard protocol following bolus administration of intravenous
contrast.

[Series 3: abdomen 5.0 · axial · 0.71mm/px · z∈[+1058,+1338]mm · 12 of 64 slices shown, 14 images]
[im 4/64  soft-tissue]
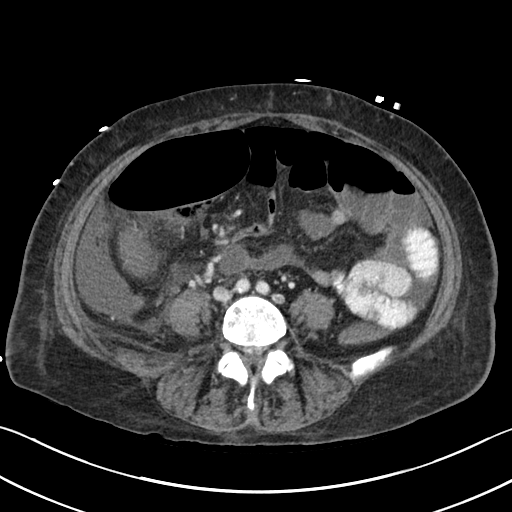
[im 4/64  bone]
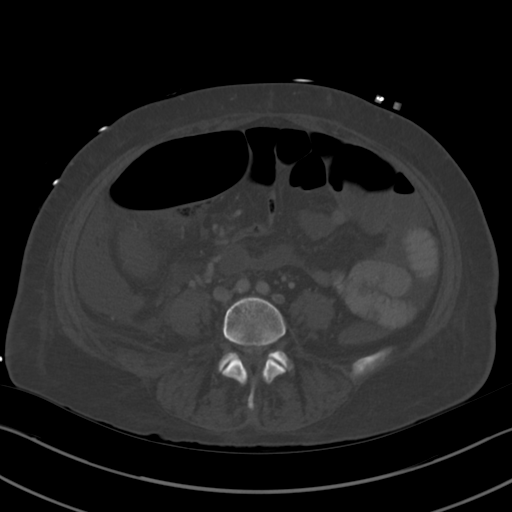
[im 10/64  soft-tissue]
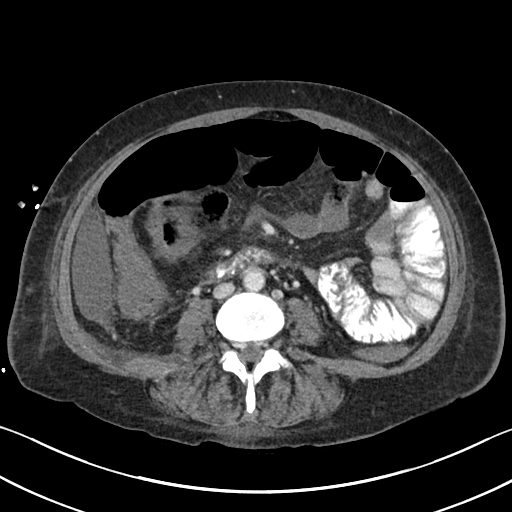
[im 13/64  soft-tissue]
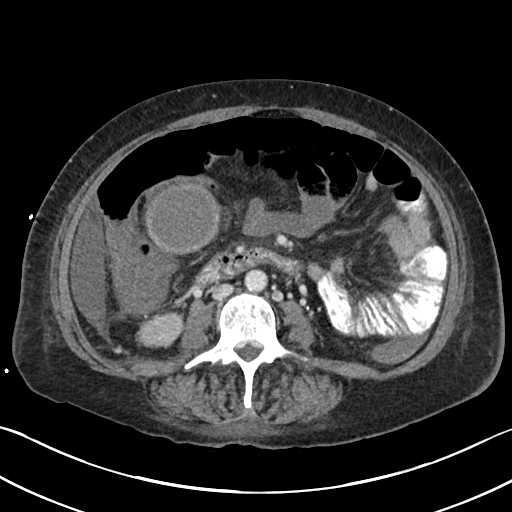
[im 19/64  soft-tissue]
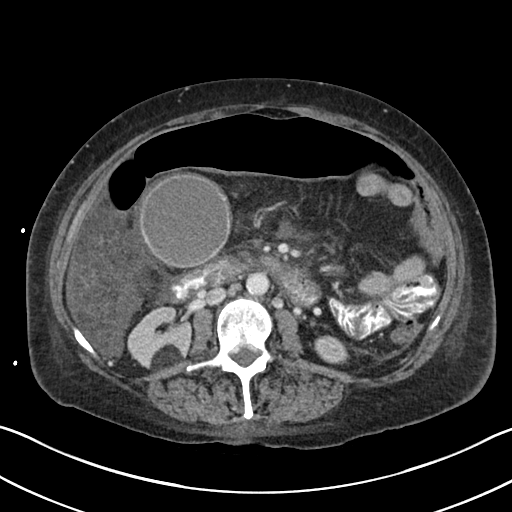
[im 26/64  soft-tissue]
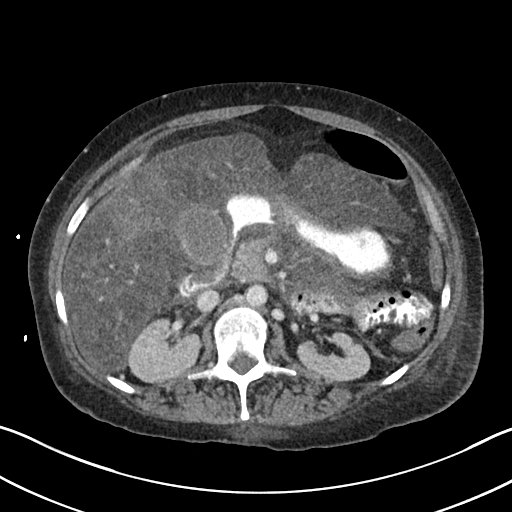
[im 29/64  soft-tissue]
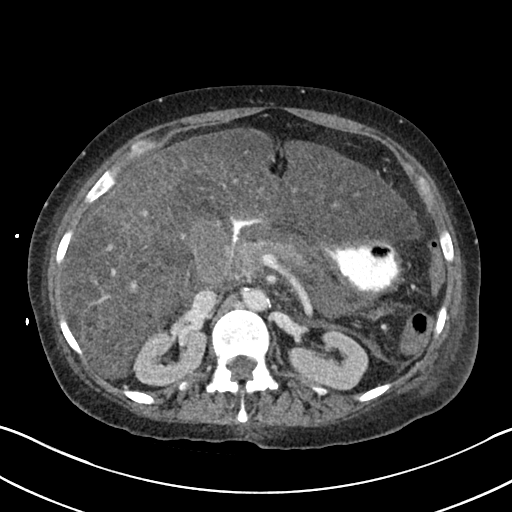
[im 35/64  soft-tissue]
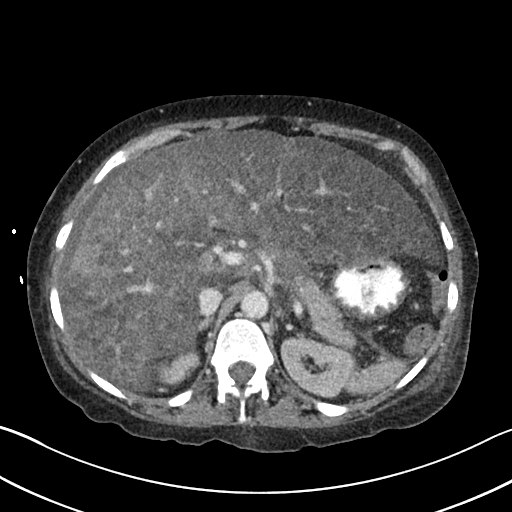
[im 38/64  soft-tissue]
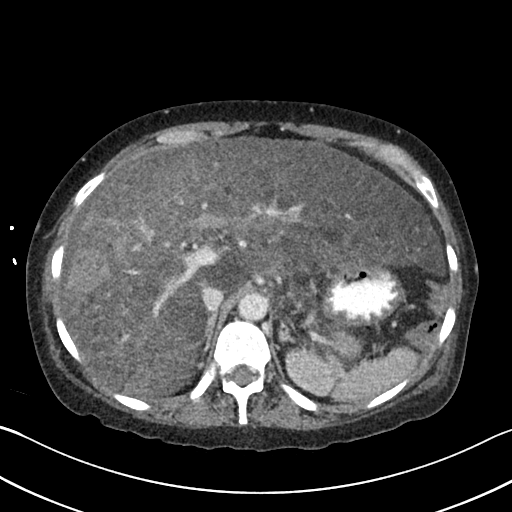
[im 45/64  soft-tissue]
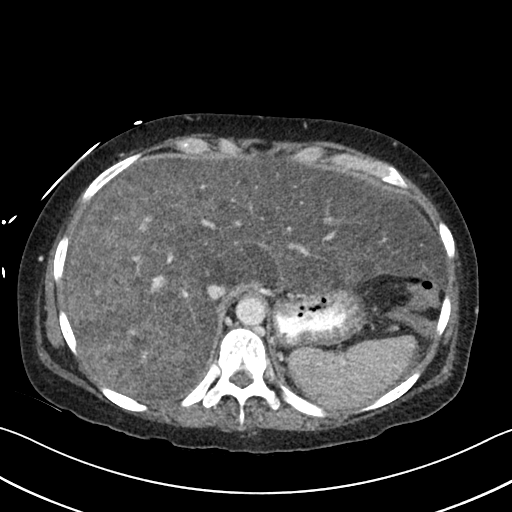
[im 45/64  bone]
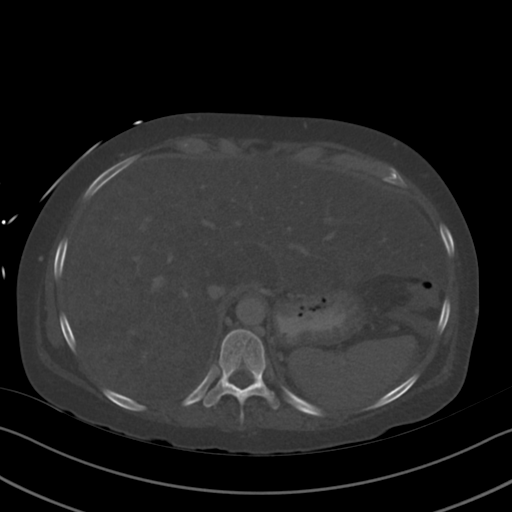
[im 51/64  soft-tissue]
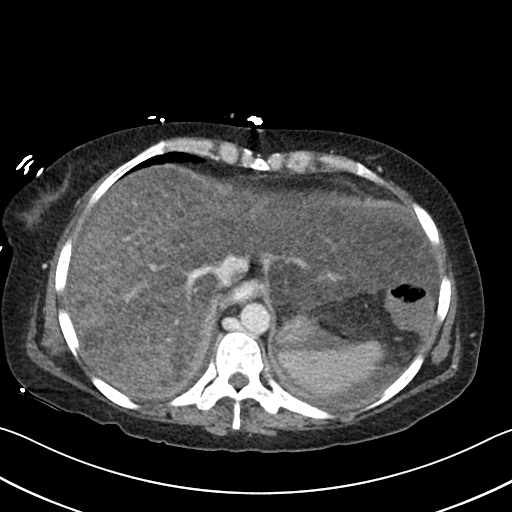
[im 54/64  soft-tissue]
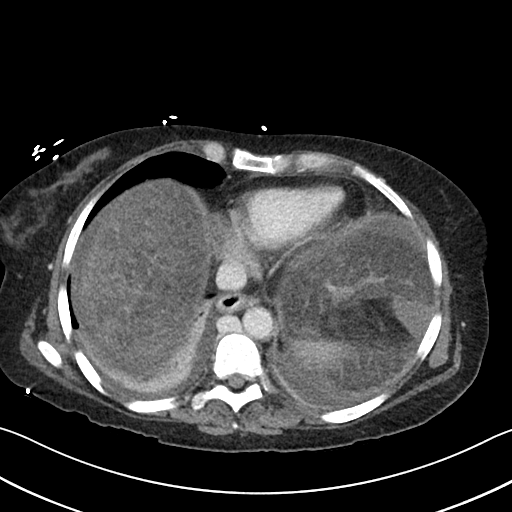
[im 60/64  soft-tissue]
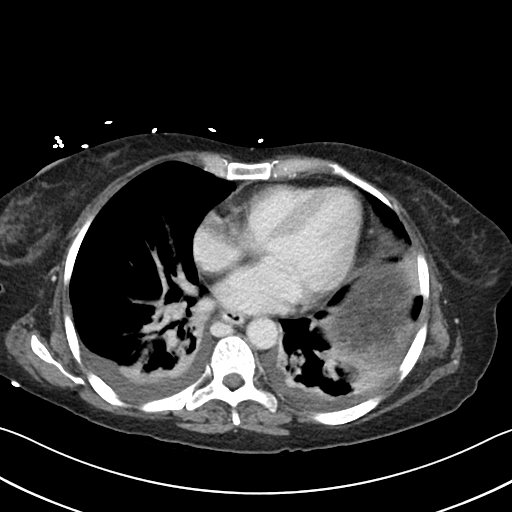

[Series 6: abdomen 3.0 mpr cor · coronal · 0.62mm/px · 3 of 93 slices shown]
[im 31/93  soft-tissue]
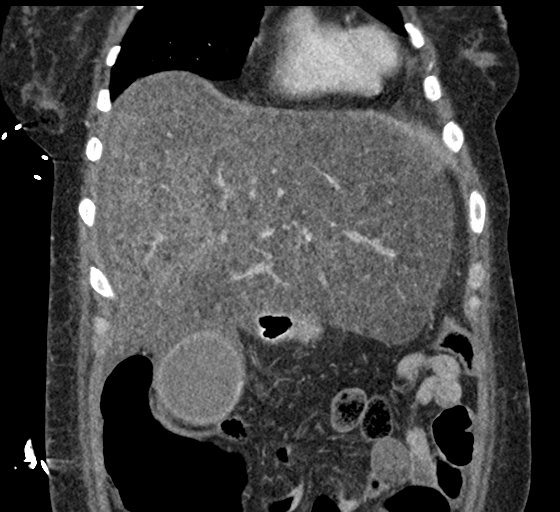
[im 41/93  soft-tissue]
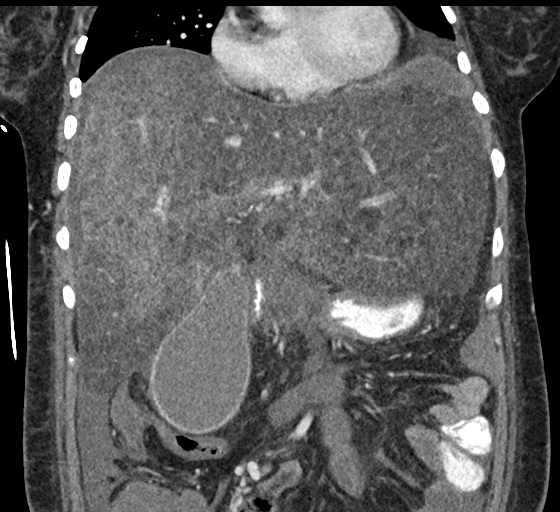
[im 52/93  soft-tissue]
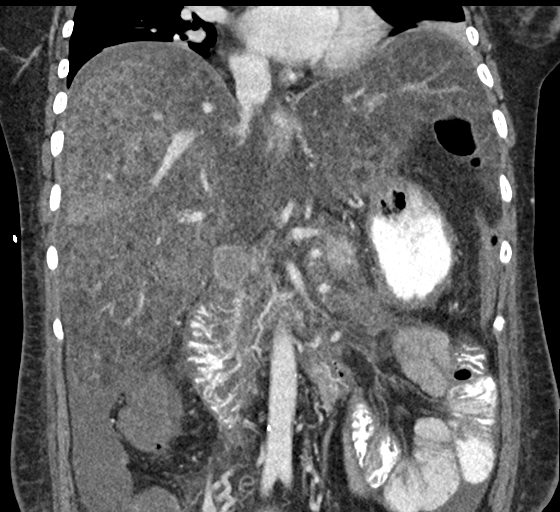

[15 of 46 positions shown; findings below may reference images not displayed]

RADIATION DOSE REDUCTION: This exam was performed according to the
departmental dose-optimization program which includes automated
exposure control, adjustment of the mA and/or kV according to
patient size and/or use of iterative reconstruction technique.

CONTRAST:  75mL OMNIPAQUE IOHEXOL 300 MG/ML  SOLN
FINDINGS: Lower chest: Bilateral dependent collapse/consolidative disease
noted in both lungs with small bilateral pleural effusions.

Hepatobiliary: Marked hepatomegaly with nodular, diffusely decreased
attenuation of liver parenchyma. Gallbladder is massively distended
measuring 6.5 cm diameter. Tiny calcified gallstone identified
towards the neck. No intrahepatic or extrahepatic biliary dilation.

Pancreas: No focal mass lesion. No dilatation of the main duct. No
intraparenchymal cyst. No peripancreatic edema.

Spleen: No splenomegaly. No focal mass lesion.

Adrenals/Urinary Tract: No adrenal nodule or mass. 2.6 cm fat
density lesion posterior lower interpolar right kidney is stable
compatible with angiomyolipoma small nonobstructing stone again
noted lower pole right kidney. Left kidney unremarkable.

Stomach/Bowel: Stomach is unremarkable. No gastric wall thickening.
No evidence of outlet obstruction. Duodenum is normally positioned
as is the ligament of Treitz. No small bowel dilatation within the
visualized abdomen. Abdominal segments of the colon are filled with
gas and fluid, demonstrating mild distension. Curvilinear focus of
gas projects in the fluid of the right paracolic gutter but is
within a nondilated appendix.

Vascular/Lymphatic: There is mild atherosclerotic calcification of
the abdominal aorta without aneurysm. There is no gastrohepatic or
hepatoduodenal ligament lymphadenopathy. No retroperitoneal or
mesenteric lymphadenopathy.

Other: Small volume perihepatic and perisplenic fluid associated
with fluid layering in both para colic gutters.

Musculoskeletal: No worrisome lytic or sclerotic osseous
abnormality.
IMPRESSION: 1. Marked hepatomegaly with nodular, diffusely decreased attenuation
of liver parenchyma. This represents a marked change since
[DATE]. Portal and hepatic venous anatomy in the liver
parenchyma shows areas of mass effect but does not appear markedly
distorted. Imaging features may reflect marked progression of fatty
deposition within the liver parenchyma, but given the nodular
parenchyma, cirrhotic disease, innumerable metastases, or
infiltrative process cannot be excluded. MRI abdomen with and
without contrast may provide additional characterization.
2. Massive distention of the gallbladder with tiny calcified
gallstone.
3. Small volume ascites.
4. Bilateral dependent collapse/consolidative disease in both lungs
with small bilateral pleural effusions.
5. Stable 2.6 cm angiomyolipoma right kidney.
6. Aortic Atherosclerosis ([TN]-[TN]).

## 2021-10-09 MED ORDER — VITAMIN K1 10 MG/ML IJ SOLN: 5.0000 mg | Freq: Every day | INTRAVENOUS | Status: DC

## 2021-10-09 MED ORDER — SODIUM BICARBONATE 650 MG PO TABS
1300.0000 mg | ORAL_TABLET | Freq: Two times a day (BID) | ORAL | Status: DC
  Administered 2021-10-09 – 2021-10-10 (×3): 1300 mg via ORAL
  Filled 2021-10-09 (×4): qty 2

## 2021-10-09 MED ORDER — ALBUMIN HUMAN 25 % IV SOLN
25.0000 g | Freq: Three times a day (TID) | INTRAVENOUS | Status: AC
  Administered 2021-10-09 – 2021-10-10 (×6): 25 g via INTRAVENOUS
  Filled 2021-10-09 (×6): qty 100

## 2021-10-09 MED ORDER — POTASSIUM CHLORIDE CRYS ER 20 MEQ PO TBCR
40.0000 meq | EXTENDED_RELEASE_TABLET | Freq: Once | ORAL | Status: AC
  Administered 2021-10-09: 40 meq via ORAL
  Filled 2021-10-09: qty 2

## 2021-10-09 MED ORDER — SODIUM CHLORIDE 0.9 % IV SOLN
INTRAVENOUS | Status: DC | PRN
Start: 2021-10-09 — End: 2021-10-13

## 2021-10-09 MED ORDER — POTASSIUM CHLORIDE 10 MEQ/100ML IV SOLN
10.0000 meq | INTRAVENOUS | Status: AC
  Administered 2021-10-09 (×4): 10 meq via INTRAVENOUS
  Filled 2021-10-09 (×4): qty 100

## 2021-10-09 MED ORDER — IOHEXOL 300 MG/ML  SOLN
75.0000 mL | Freq: Once | INTRAMUSCULAR | Status: AC | PRN
  Administered 2021-10-09: 75 mL via INTRAVENOUS

## 2021-10-09 MED ORDER — VITAMIN K1 10 MG/ML IJ SOLN
10.0000 mg | Freq: Once | INTRAVENOUS | Status: AC
  Administered 2021-10-09: 10 mg via INTRAVENOUS
  Filled 2021-10-09: qty 1

## 2021-10-09 MED ORDER — IOHEXOL 9 MG/ML PO SOLN
500.0000 mL | ORAL | Status: AC
  Administered 2021-10-09: 500 mL via ORAL

## 2021-10-09 NOTE — NC FL2 (Signed)
?[place] MEDICAID FL2 LEVEL OF CARE SCREENING TOOL  ?  ? ?IDENTIFICATION  ?Patient Name: ?Judy Meyer Birthdate: 1961-05-03 Sex: female Admission Date (Current Location): ?09/14/2021  ?[place] and [place] Number: ? [place] ?  Facility and Address:  ?[facility]. [facility] [address] [place], [place] [address] ?     Provider Number: ?[id number]  ?Attending Physician Name and Address:  ?Sid Falcon, MD ? Relative Name and Phone Number:  ?  ?   ?Current Level of Care: ?Hospital Recommended Level of Care: ?[place] Prior Approval Number: ?  ? ?Date Approved/Denied: ?  PASRR Number: ?[id number] A ? ?Discharge Plan: ?SNF ?  ? ?Current Diagnoses: ?Patient Active Problem List  ? Diagnosis Date Noted  ? Abnormal gallbladder ultrasound   ? Acute liver failure with hepatic coma ([place])   ? 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 Alcoholic hepatitis 35/00/9381  ? Lactic acidosis 09/18/2021  ? Acute metabolic encephalopathy 82/99/3716  ? Seizure ([place]) 03/16/2021  ? Leukocytosis 03/16/2021  ? AKI (acute kidney injury) ([place]) 03/16/2021  ? Hyperammonemia ([place]) 03/16/2021  ? Elevated CK 11/15/2020  ? Bacterial conjunctivitis of both eyes 11/15/2020  ? Prolonged Q-T interval on ECG 11/14/2020  ? Intractable vomiting with nausea 11/14/2020  ? Jaundice   ? Transaminitis   ? Hypomagnesemia   ? Swelling of lower extremity   ? Folate deficiency   ? Rhabdomyolysis 07/20/2020  ? Malnutrition of moderate degree 07/19/2020  ? Acute lower UTI 07/18/2020  ? Dehydration 07/18/2020  ? Hyperbilirubinemia 07/18/2020  ? Acidosis, metabolic 96/78/9381  ? Abnormal liver function 07/18/2020  ? Macrocytic anemia 07/18/2020  ? Iron excess 07/18/2020  ? Gastroenteritis 04/07/2020  ? Nephrolithiasis 04/07/2020  ? HTN (hypertension) 04/07/2020  ? Intractable nausea and vomiting 04/06/2020  ? Depression 04/06/2020  ? Hypokalemia 04/06/2020  ? Hypothyroidism 04/06/2020  ? Hyperlipidemia  04/06/2020  ? ? ?Orientation RESPIRATION BLADDER Height & Weight   ?  ?Self, Time, Situation, Place ? Normal Incontinent Weight: 140 lb 6.9 oz (63.7 kg) ?Height:  5\' 6"  (167.6 cm)  ?BEHAVIORAL SYMPTOMS/MOOD NEUROLOGICAL BOWEL NUTRITION STATUS  ?    Incontinent (rectal pouch) Diet (See DC Summary)  ?AMBULATORY STATUS COMMUNICATION OF NEEDS Skin   ?Limited Assist Verbally Normal ?  ?  ?  ?    ?     ?     ? ? ?Personal Care Assistance Level of Assistance  ?Bathing, Feeding, Dressing Bathing Assistance: Limited assistance ?Feeding assistance: Independent ?Dressing Assistance: Limited assistance ?   ? ?Functional Limitations Info  ?Sight Sight Info: Impaired ?  ?   ? ? ?SPECIAL CARE FACTORS FREQUENCY  ?PT (By licensed PT), OT (By licensed OT)   ?  ?PT Frequency: 5x/week ?OT Frequency: 5x/week ?  ?  ?  ?   ? ? ?Contractures Contractures Info: Not present  ? ? ?Additional Factors Info  ?Code Status, Allergies Code Status Info: Full ?Allergies Info: Compazine (Prochlorperazine Edisylate), Haloperidol, Periactin (Cyproheptadine), Cyclobenzaprine, Haloperidol And Related, Metoclopramide ?  ?  ?  ?   ? ?Current Medications (10/09/2021):  This is the current hospital active medication list ?Current Facility-Administered Medications  ?Medication Dose Route Frequency Provider Last Rate Last Admin  ? 0.9 %  sodium chloride infusion   Intravenous PRN Sid Falcon, MD   Stopped at 10/09/21 1530  ? albumin human 25 % solution 25 g  25 g Intravenous TID Rick Duff, MD   Stopped at 10/09/21  1319  ? chlorhexidine (PERIDEX) 0.12 % solution 15 mL  15 mL Mouth Rinse BID Angelica Pou, MD   15 mL at 10/09/21 0846  ? enoxaparin (LOVENOX) injection 40 mg  40 mg Subcutaneous Q24H Iona Beard, MD   40 mg at 10/08/21 2150  ? feeding supplement (ENSURE ENLIVE / ENSURE PLUS) liquid 237 mL  237 mL Oral BID BM Sid Falcon, MD   237 mL at 10/09/21 0845  ? folic acid (FOLVITE) tablet 1 mg  1 mg Oral Daily Iona Beard, MD   1  mg at 10/09/21 0846  ? HYDROmorphone (DILAUDID) injection 0.5 mg  0.5 mg Intravenous Q4H PRN Marianna Payment, MD   0.5 mg at 10/09/21 0321  ? hydrOXYzine (ATARAX) tablet 25 mg  25 mg Oral Q12H PRN Marianna Payment, MD   25 mg at 10/05/21 0127  ? lactulose (CHRONULAC) 10 GM/15ML solution 20 g  20 g Oral TID Farrel Gordon, DO   20 g at 10/09/21 0846  ? levETIRAcetam (KEPPRA) tablet 500 mg  500 mg Oral BID Iona Beard, MD   500 mg at 10/09/21 0846  ? levothyroxine (SYNTHROID) tablet 75 mcg  75 mcg Oral Q0600 Iona Beard, MD   75 mcg at 10/09/21 0543  ? MEDLINE mouth rinse  15 mL Mouth Rinse q12n4p Angelica Pou, MD   15 mL at 10/09/21 1243  ? multivitamin with minerals tablet 1 tablet  1 tablet Oral Daily Iona Beard, MD   1 tablet at 10/09/21 [phone]  ? ondansetron (ZOFRAN) injection 4 mg  4 mg Intravenous Q8H PRN Katsadouros, Vasilios, MD   4 mg at 10/09/21 0219  ? pantoprazole (PROTONIX) EC tablet 40 mg  40 mg Oral Q0600 Vena Rua, PA-C   40 mg at 10/09/21 0543  ? piperacillin-tazobactam (ZOSYN) IVPB 3.375 g  3.375 g Intravenous Q8H HoffmanCindee Salt C, DO 12.5 mL/hr at 10/09/21 1530 3.375 g at 10/09/21 1530  ? rifaximin (XIFAXAN) tablet 550 mg  550 mg Oral BID Nelida Meuse III, MD   550 mg at 10/09/21 0846  ? sodium bicarbonate tablet 1,300 mg  1,300 mg Oral BID Rick Duff, MD   1,300 mg at 10/09/21 1236  ? thiamine tablet 100 mg  100 mg Oral Daily Iona Beard, MD   100 mg at 10/09/21 0315  ? ? ? ?Discharge Medications: ?Please see discharge summary for a list of discharge medications. ? ?Relevant Imaging Results: ? ?Relevant Lab Results: ? ? ?Additional Information ?SSN: [id number]. No covid vaccines in system. ? ?Benard Halsted, LCSW ? ? ? ? ?

## 2021-10-09 NOTE — Progress Notes (Signed)
Inpatient Rehabilitation Admissions Coordinator  ? ?I met with patient at bedside for rehab assessment and then I contacted her fiance by phone to discuss her rehab options. He works full time and patient needs to be independent level to be able to return home alone while he works. Patient is appropriate for SNF level rehab to give her a prolonged rehab recovery. He is in agreement. I will alert acute team and TOC. We will not pursue Cir admit and will sign off. ? ?Danne Baxter, RN, MSN ?Rehab Admissions Coordinator ?[phone] ?10/09/2021 11:00 AM ? ?

## 2021-10-09 NOTE — Progress Notes (Addendum)
? ?HD#6 ?SUBJECTIVE:  ?Patient Summary: Judy Meyer is a 61 y.o. with a pertinent PMH of alcohol associated hepatitis, hypothyroidism, hypertension, seizures, anemia, hyperlipidemia and alcohol use disorder who presented with worsening of her jaundice, nausea, vomiting and admitted for acute alcoholic hepatitis c/b acute liver failure.  ? ?Overnight Events: None ? ?Interim History: Patient reports persistent diffuse abdominal pain, but otherwise no pain. She is trying to eat some food, but has a poor appetite. ? ?OBJECTIVE:  ?Vital Signs: ?Vitals:  ? 10/09/21 0329 10/09/21 0800 10/09/21 0830 10/09/21 1000  ?BP: 101/61   (!) 88/55  ?Pulse: 82  85 86  ?Resp: 20 (!) 22  20  ?Temp: 97.7 ?F (36.5 ?C)  97.9 ?F (36.6 ?C)   ?TempSrc: Oral  Oral   ?SpO2:   96% 95%  ?Weight:      ?Height:      ? ?Supplemental O2: Room Air ?SpO2: 95 % ?O2 Flow Rate (L/min): 2 L/min ?FiO2 (%): 21 % ? ?Filed Weights  ? 10/04/21 0337  ?Weight: 63.7 kg  ? ?BP (!) 88/55   Pulse 86   Temp 97.9 ?F (36.6 ?C) (Oral)   Resp 20   Ht _0  (1.676 m)   Wt 63.7 kg   LMP 06/11/2018   SpO2 95%   BMI 22.67 kg/m?  ?General: Alert, oriented, no distress.  ?Skin: normal turgor, no rashes, warm and dry, diffusely jaundiced  ?HEENT: Normocephalic, atraumatic. Pupils equal round and reactive to light; sclera icteric; extraocular muscles intact ?Mouth/Parynx benign ?Neck: No JVD, no carotid bruits; normal carotid upstroke ?Lungs: clear to ausculatation and percussion; no wheezing or rales ?Heart: PMI not displaced, RRR, s1 s2 normal, no diastolic murmur, no rubs, gallops, thrills, or heaves ?Abdomen: soft, nontender; no hepatosplenomehaly, BS+ ?Musculoskeletal: full range of motion, normal strength, no joint deformities ?Extremities: no clubbing cyanosis or edema ?Neurologic: grossly nonfocal; Cranial nerves grossly wnl, mild asterixis, communicates appropriately   ?Psychologic: Flat affect ? ? ?Intake/Output Summary (Last 24 hours) at 10/09/2021  1109 ?Last data filed at 10/09/2021 0500 ?Gross per 24 hour  ?Intake 91.32 ml  ?Output --  ?Net 91.32 ml  ? ? ?Net IO Since Admission: 3,990.45 mL [10/09/21 1109] ? ? ? ?Patient Lines/Drains/Airways Status   ? ? Active Line/Drains/Airways   ? ? Name Placement date Placement time Site Days  ? Peripheral IV 10/08/21 22 G 1" Left;Anterior Forearm 10/08/21  0547  Forearm  less than 1  ? ?  ?  ? ?  ? ? ? ?ASSESSMENT/PLAN:  ?Assessment: ?Principal Problem: ?  Alcoholic hepatitis ?Active Problems: ?  Hypokalemia ?  Hyperbilirubinemia ?  Acidosis, metabolic ?  Leukocytosis ?  Lactic acidosis ?  Acute liver failure with hepatic coma ([place]) ?  Abnormal gallbladder ultrasound ? ? ?Plan: ?NORAA PICKERAL is a 61 y.o. with a pertinent PMH of alcohol associated hepatitis, hypothyroidism, hypertension, seizures, anemia, hyperlipidemia and alcohol use disorder who presented with worsening of her jaundice, nausea, vomiting and admitted for acute alcoholic hepatitis now in acute liver failure.  ? ?#Acute liver failure c/b HRS  ?#Acute alcoholic hepatitis c/b acute cholecystitis  ?Patient mental status improved with lactulose and rifaximin. She is having episodes of hypotension with MAPs as low as 60. Her LFTs are worsening as well as her PT/INR, and her renal function. She also has a significant leukocytosis. CT A/P 3/2 without evidence of abscess, marked distention of the gallbladder noted as well as hepatomegaly w/ nodular parenchyma c/f cirrhotic dz v  multiple met v infiltrative dz. Though other imaging findings could be a result of fatty deposition only.  ?-GI following, appreciate their assistance ?            -s/p Vitamin K IV day 4/4 3/2 ? -Calculate Lille score 03/04 ?-Surgery following, re-consulted to evaluate patient's severely distended gallbladder 3/3 ?            -Likely poor surgical candidate given her liver failure ? -Will likely need to reach out to IR  ?-Continue zosyn IV 3.375 g TID, day 5/7 ?-Prednisolone 40 mg  daily, day 6; calculate Lille score after 7 days of therapy ?-Lactulose 20 g 3 times daily, goal of 3 BMs per day ?-Rifaximin 550 mg BID ?-Thiamine 100 mg daily ?-Hold on MRI of abdomen for now.  ?-Trend CMP, PT/INR ? ?Persistent mild anion gap metabolic acidosis ?Likely 2/2 lactic acidosis and concomitant worsening renal function. Bicarbonate worsened this hospitalization. This has been persistent throughout admission.  ?-Replete potassium ?-Trend CMP ?-Replete electrolytes as needed ?  ?High risk for alcohol withdrawal ?Alcohol Use Disorder ?Patient is high risk for severe alcohol withdrawal. Currently on CIWA protocol without Ativan. Continue to monitor and if begins showing signs of withdrawal, can use ativan. ?  ?Hx of seizure disorder ?Continue keppra 500 mg bid. ? ?Best Practice: ?Diet: Soft  ?IVF: Fluids: IV albumin  ?VTE: enoxaparin (LOVENOX) injection 40 mg Start: 09/18/2021 2206 ?SCDs Start: 09/14/2021 2027 ?Code: Full ?AB: Zosyn 3.375 g TID, day 4/7 ?DISPO: Pending clinical course.  ? ?Signature: ?Judy Duff, MD PGY-2 ?Internal Medicine  ?Pager [phone] ?11:09 AM, 10/09/2021  ?Please contact the on call pager after 5 pm and on weekends at [phone]. ? ?

## 2021-10-09 NOTE — Progress Notes (Addendum)
? ? ? ?Progress Note ?Hospital Day: 7 ? ?Chief Complaint:  Etoh hepatitis   ?   ? ?ASSESSMENT AND PLAN  ? ?Brief History:  ?61 yo female followed by [facility]. Last seen in July for acute Etoh hepatitis and possibly drug induced liver injury . Currently admitted with severe acute Etoh hepatitis, severe metabolic acidosis / hepatic encephalopathy/ upper abdominal pain and question of cholecystitis .  ? ?# Severe Etoh hepatitis ?--Metabolic acidosis, Bicarb 16. K+ 3.2 ?--Creatinine continues to rise 1.3 >> 1.56. GFR 38 ?--Started Prednisolone on 2/26. Has progressive leukocytosis, WBC 32.7. CTAP >> massive distention of gallbladder with tiny gallstone. We are stopping steroids, she isn't responding to them anyway. Tbili 34.1. May need cholecystostomy tube, IR has been consulted and recommending HIDA first. Would ask general Surgery to see her again.  ?--Coagulopathy.  INR elevated but stable overnight at 2.5. Got IV vit K this am ?--Hepatic encephalopathy: On Rifaximin and lactulose. She is alert and oriented  ?--Reversal of blood flow in R portal vein and main portal vein. No evidence for thrombosis. ? ?  ?# Etoh use disorder, actively drinking prior to discharge ?  ?   ? [facility] Attending  ? ?She has a markedly dilated gallbladder and worsening biochemical pattern and pain. I am concerned that she does have cholecystitis. Per IM team HIDA recommended and then will consider percutaneous cholecystectomy. Plans as above - Dr. Benson Norway will see her this weekend. Once we sort out the gallbladder more can decide re: MR Liver re: abnormality of parenchyma on CT. Will get an AFP also - if markedly elevated will be helpful. ? ?Gatha Mayer, MD, [facility] ?[facility] ?10/09/2021 3:04 PM ? ?SUBJECTIVE  ? ?Constant RUQ pain. Tolerated some of her lunch ?    ? ? ?OBJECTIVE  ? ?  ? ?Scheduled inpatient medications:  ?? chlorhexidine  15 mL Mouth Rinse BID  ?? enoxaparin (LOVENOX) injection  40 mg Subcutaneous  Q24H  ?? feeding supplement  237 mL Oral BID BM  ?? folic acid  1 mg Oral Daily  ?? lactulose  20 g Oral TID  ?? levETIRAcetam  500 mg Oral BID  ?? levothyroxine  75 mcg Oral Q0600  ?? mouth rinse  15 mL Mouth Rinse q12n4p  ?? multivitamin with minerals  1 tablet Oral Daily  ?? pantoprazole  40 mg Oral Q0600  ?? prednisoLONE  40 mg Oral Daily  ?? rifaximin  550 mg Oral BID  ?? thiamine  100 mg Oral Daily  ? ?Continuous inpatient infusions:  ?? albumin human    ?? phytonadione (VITAMIN K) IV 10 mg (10/09/21 1122)  ?? piperacillin-tazobactam (ZOSYN)  IV 3.375 g (10/09/21 0612)  ?? potassium chloride 10 mEq (10/09/21 1120)  ? ?PRN inpatient medications: HYDROmorphone (DILAUDID) injection, hydrOXYzine, ondansetron (ZOFRAN) IV ? ?Vital signs in last 24 hours: ?Temp:  [97.6 ?F (36.4 ?C)-98.4 ?F (36.9 ?C)] 97.6 ?F (36.4 ?C) (03/03 1139) ?Pulse Rate:  [82-89] 86 (03/03 1000) ?Resp:  [20-22] 20 (03/03 1000) ?BP: (88-110)/(52-64) 88/55 (03/03 1000) ?SpO2:  [95 %-97 %] 95 % (03/03 1000) ?Last BM Date : 10/08/21 ? ?Intake/Output Summary (Last 24 hours) at 10/09/2021 1140 ?Last data filed at 10/09/2021 0500 ?Gross per 24 hour  ?Intake 91.32 ml  ?Output --  ?Net 91.32 ml  ? ? ? ?Physical Exam:  ?General: Alert female in NAD ?Heart:  Regular rate and rhythm. No lower extremity edema ?Pulmonary: Normal respiratory effort ?Abdomen: Soft, hypoactive bowel sounds.  ?  Neurologic: minor decrease in reaction times. Alert and oriented ?Psych:  Cooperative.  ? Danley Danker Weights  ? 10/04/21 7253  ?Weight: 63.7 kg  ? ? ?ntake/Output from previous day: ?03/02 0701 - 03/03 0700 ?In: 91.3 [IV Piggyback:91.3] ?Out: -  ?Intake/Output this shift: ?No intake/output data recorded. ? ? ?DIAGNOSTIC STUDIES THIS ADMISSION:  ?CT ABDOMEN W CONTRAST ? ?Result Date: 10/09/2021 ?CLINICAL DATA:  Jaundice. EXAM: CT ABDOMEN WITH CONTRAST TECHNIQUE: Multidetector CT imaging of the abdomen was performed using the standard protocol following bolus administration of  intravenous contrast. RADIATION DOSE REDUCTION: This exam was performed according to the departmental dose-optimization program which includes automated exposure control, adjustment of the mA and/or kV according to patient size and/or use of iterative reconstruction technique. CONTRAST:  57mL OMNIPAQUE IOHEXOL 300 MG/ML  SOLN COMPARISON:  07/25/2021 FINDINGS: Lower chest: Bilateral dependent collapse/consolidative disease noted in both lungs with small bilateral pleural effusions. Hepatobiliary: Marked hepatomegaly with nodular, diffusely decreased attenuation of liver parenchyma. Gallbladder is massively distended measuring 6.5 cm diameter. Tiny calcified gallstone identified towards the neck. No intrahepatic or extrahepatic biliary dilation. Pancreas: No focal mass lesion. No dilatation of the main duct. No intraparenchymal cyst. No peripancreatic edema. Spleen: No splenomegaly. No focal mass lesion. Adrenals/Urinary Tract: No adrenal nodule or mass. 2.6 cm fat density lesion posterior lower interpolar right kidney is stable compatible with angiomyolipoma small nonobstructing stone again noted lower pole right kidney. Left kidney unremarkable. Stomach/Bowel: Stomach is unremarkable. No gastric wall thickening. No evidence of outlet obstruction. Duodenum is normally positioned as is the ligament of Treitz. No small bowel dilatation within the visualized abdomen. Abdominal segments of the colon are filled with gas and fluid, demonstrating mild distension. Curvilinear focus of gas projects in the fluid of the right paracolic gutter but is within a nondilated appendix. Vascular/Lymphatic: There is mild atherosclerotic calcification of the abdominal aorta without aneurysm. There is no gastrohepatic or hepatoduodenal ligament lymphadenopathy. No retroperitoneal or mesenteric lymphadenopathy. Other: Small volume perihepatic and perisplenic fluid associated with fluid layering in both para colic gutters. Musculoskeletal:  No worrisome lytic or sclerotic osseous abnormality. IMPRESSION: 1. Marked hepatomegaly with nodular, diffusely decreased attenuation of liver parenchyma. This represents a marked change since 07/25/2021. Portal and hepatic venous anatomy in the liver parenchyma shows areas of mass effect but does not appear markedly distorted. Imaging features may reflect marked progression of fatty deposition within the liver parenchyma, but given the nodular parenchyma, cirrhotic disease, innumerable metastases, or infiltrative process cannot be excluded. MRI abdomen with and without contrast may provide additional characterization. 2. Massive distention of the gallbladder with tiny calcified gallstone. 3. Small volume ascites. 4. Bilateral dependent collapse/consolidative disease in both lungs with small bilateral pleural effusions. 5. 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 6. Aortic Atherosclerosis (ICD10-I70.0). Electronically Signed   By: Misty Stanley M.D.   On: 10/09/2021 06:08   ? ? ?Lab Results: ?Recent Labs  ?  10/08/21 ?0646 10/09/21 ?0131  ?WBC 30.8* 32.7*  ?HGB 8.9* 8.7*  ?HCT 26.3* 26.0*  ?PLT 217 245  ? ?BMET ?Recent Labs  ?  10/07/21 ?1427 10/08/21 ?6644 10/09/21 ?0131  ?NA 134* 138 132*  ?K 3.4* 3.1* 3.2*  ?CL 104 104 103  ?CO2 17* 20* 16*  ?GLUCOSE 118* 87 98  ?BUN 12 13 18   ?CREATININE 1.32* 1.33* 1.56*  ?CALCIUM 7.6* 7.5* 7.2*  ? ?LFT ?Recent Labs  ?  10/09/21 ?0131  ?PROT 6.0*  ?ALBUMIN 2.0*  ?AST 406*  ?ALT 110*  ?ALKPHOS 206*  ?  BILITOT 34.1*  ? ?PT/INR ?Recent Labs  ?  10/08/21 ?0646 10/09/21 ?0131  ?LABPROT 26.9* 26.9*  ?INR 2.5* 2.5*  ? ?Hepatitis Panel ?No results for input(s): HEPBSAG, HCVAB, HEPAIGM, HEPBIGM in the last 72 hours. ? ? ? ?Principal Problem: ?  Alcoholic hepatitis ?Active Problems: ?  Hypokalemia ?  Hyperbilirubinemia ?  Acidosis, metabolic ?  Leukocytosis ?  Lactic acidosis ?  Acute liver failure with hepatic coma ([place]) ?  Abnormal gallbladder ultrasound ? ? ? ? LOS: 6 days  ? ?Tye Savoy ,NP 10/09/2021, 11:40 AM ? ? ? ? ? ? ?

## 2021-10-09 NOTE — TOC Initial Note (Signed)
Transition of Care (TOC) - Initial/Assessment Note  ? ? ?Patient Details  ?Name: Judy Meyer ?MRN: [id number] ?Date of Birth: 10-Jul-1961 ? ?Transition of Care (TOC) CM/SW Contact:    ?Benard Halsted, LCSW ?Phone Number: ?10/09/2021, 4:59 PM ? ?Clinical Narrative:                 ?Per patient's significant other, Judy Meyer, they are requesting SNF for patient as he is unable to assist 24/7. CSW sent out referral for review to those facilities in network with her insurance. ? ?Expected Discharge Plan: [place] ?Barriers to Discharge: Continued Medical Work up, Ship broker, SNF Pending bed offer ? ? ?Patient Goals and CMS Choice ?Patient states their goals for this hospitalization and ongoing recovery are:: Rehab ?CMS Medicare.gov Compare Post Acute Care list provided to:: Patient ?Choice offered to / list presented to : Patient ? ?Expected Discharge Plan and Services ?Expected Discharge Plan: [place] ?In-house Referral: Clinical Social Work ?  ?Post Acute Care Choice: [place] ?Living arrangements for the past 2 months: [place] ?                ?  ?  ?  ?  ?  ?  ?  ?  ?  ?  ? ?Prior Living Arrangements/Services ?Living arrangements for the past 2 months: [place] ?Lives with:: Significant Other ?Patient language and need for interpreter reviewed:: Yes ?Do you feel safe going back to the place where you live?: Yes      ?Need for Family Participation in Patient Care: Yes (Comment) ?Care giver support system in place?: Yes (comment) ?  ?Criminal Activity/Legal Involvement Pertinent to Current Situation/Hospitalization: No - Comment as needed ? ?Activities of Daily Living ?Home Assistive Devices/Equipment: Gilford Rile (specify type) ?ADL Screening (condition at time of admission) ?Patient's cognitive ability adequate to safely complete daily activities?: Yes ?Is the patient deaf or have difficulty hearing?: No ?Does the patient have difficulty seeing,  even when wearing glasses/contacts?: No ?Does the patient have difficulty concentrating, remembering, or making decisions?: No ?Patient able to express need for assistance with ADLs?: Yes ?Does the patient have difficulty dressing or bathing?: Yes ?Independently performs ADLs?: No ?Communication: Independent ?Dressing (OT): Needs assistance ?Is this a change from baseline?: Pre-admission baseline ?Grooming: Needs assistance ?Is this a change from baseline?: Pre-admission baseline ?Feeding: Independent ?Bathing: Needs assistance ?Is this a change from baseline?: Pre-admission baseline ?Toileting: Needs assistance ?Is this a change from baseline?: Pre-admission baseline ?In/Out Bed: Needs assistance ?Is this a change from baseline?: Pre-admission baseline ?Walks in Home: Needs assistance ?Is this a change from baseline?: Pre-admission baseline ?Does the patient have difficulty walking or climbing stairs?: Yes ?Weakness of Legs: Both ?Weakness of Arms/Hands: None ? ?Permission Sought/Granted ?Permission sought to share information with : Facility Sport and exercise psychologist, Family Supports ?Permission granted to share information with : Yes, Verbal Permission Granted ? Share Information with NAME: Judy Meyer ? Permission granted to share info w AGENCY: SNFs ? Permission granted to share info w Relationship: Significant other ? Permission granted to share info w Contact Information: [phone] ? ?Emotional Assessment ?Appearance:: Appears stated age ?Attitude/Demeanor/Rapport: Engaged ?Affect (typically observed): Accepting, Appropriate ?Orientation: : Oriented to Self, Oriented to Place, Oriented to  Time, Oriented to Situation ?Alcohol / Substance Use: Alcohol Use ?Psych Involvement: No (comment) ? ?Admission diagnosis:  Hypokalemia [E87.6] ?Abdominal pain [R10.9] ?Sepsis ([place]) [A41.9] ?Subacute liver failure without hepatic coma [K72.00] ?Acute on chronic alcoholic liver disease ([place]) [K70.9] ?  Patient Active Problem List  ?  Diagnosis Date Noted  ? Abnormal gallbladder ultrasound   ? Acute liver failure with hepatic coma ([place])   ? Acute on chronic alcoholic liver disease ([place]) 09/12/2021  ? Alcoholic hepatitis 16/05/9603  ? Lactic acidosis 09/12/2021  ? Acute metabolic encephalopathy 54/04/8118  ? Seizure ([place]) 03/16/2021  ? Leukocytosis 03/16/2021  ? AKI (acute kidney injury) ([place]) 03/16/2021  ? Hyperammonemia ([place]) 03/16/2021  ? Elevated CK 11/15/2020  ? Bacterial conjunctivitis of both eyes 11/15/2020  ? Prolonged Q-T interval on ECG 11/14/2020  ? Intractable vomiting with nausea 11/14/2020  ? Jaundice   ? Transaminitis   ? Hypomagnesemia   ? Swelling of lower extremity   ? Folate deficiency   ? Rhabdomyolysis 07/20/2020  ? Malnutrition of moderate degree 07/19/2020  ? Acute lower UTI 07/18/2020  ? Dehydration 07/18/2020  ? Hyperbilirubinemia 07/18/2020  ? Acidosis, metabolic 14/78/2956  ? Abnormal liver function 07/18/2020  ? Macrocytic anemia 07/18/2020  ? Iron excess 07/18/2020  ? Gastroenteritis 04/07/2020  ? Nephrolithiasis 04/07/2020  ? HTN (hypertension) 04/07/2020  ? Intractable nausea and vomiting 04/06/2020  ? Depression 04/06/2020  ? Hypokalemia 04/06/2020  ? Hypothyroidism 04/06/2020  ? Hyperlipidemia 04/06/2020  ? ?PCP:  Pcp, No ?Pharmacy:   ?[id number] - [place], [place] - [address] [place] ?[place] ?[place] [address] ?Phone: [phone] Fax: [phone] ? ? ? ? ?Social Determinants of Health (SDOH) Interventions ?  ? ?Readmission Risk Interventions ?No flowsheet data found. ? ? ?

## 2021-10-10 ENCOUNTER — Inpatient Hospital Stay (HOSPITAL_COMMUNITY): Payer: Medicare PPO

## 2021-10-10 DIAGNOSIS — K7200 Acute and subacute hepatic failure without coma: Secondary | ICD-10-CM | POA: Diagnosis not present

## 2021-10-10 LAB — CBC
HCT: 20.7 % — ABNORMAL LOW (ref 36.0–46.0)
HCT: 23.3 % — ABNORMAL LOW (ref 36.0–46.0)
Hemoglobin: 6.9 g/dL — CL (ref 12.0–15.0)
Hemoglobin: 8.1 g/dL — ABNORMAL LOW (ref 12.0–15.0)
MCH: 35.8 pg — ABNORMAL HIGH (ref 26.0–34.0)
MCH: 34.8 pg — ABNORMAL HIGH (ref 26.0–34.0)
MCHC: 33.3 g/dL (ref 30.0–36.0)
MCHC: 34.8 g/dL (ref 30.0–36.0)
MCV: 107.3 fL — ABNORMAL HIGH (ref 80.0–100.0)
MCV: 100 fL (ref 80.0–100.0)
Platelets: 192 10*3/uL (ref 150–400)
Platelets: 180 10*3/uL (ref 150–400)
RBC: 1.93 MIL/uL — ABNORMAL LOW (ref 3.87–5.11)
RBC: 2.33 MIL/uL — ABNORMAL LOW (ref 3.87–5.11)
RDW: 36 % — ABNORMAL HIGH (ref 11.5–15.5)
RDW: 31.4 % — ABNORMAL HIGH (ref 11.5–15.5)
WBC: 35 10*3/uL — ABNORMAL HIGH (ref 4.0–10.5)
WBC: 33.6 10*3/uL — ABNORMAL HIGH (ref 4.0–10.5)
nRBC: 6.2 % — ABNORMAL HIGH (ref 0.0–0.2)
nRBC: 8.7 % — ABNORMAL HIGH (ref 0.0–0.2)

## 2021-10-10 LAB — GLUCOSE, CAPILLARY
Glucose-Capillary: 71 mg/dL (ref 70–99)
Glucose-Capillary: 75 mg/dL (ref 70–99)
Glucose-Capillary: 77 mg/dL (ref 70–99)
Glucose-Capillary: 79 mg/dL (ref 70–99)
Glucose-Capillary: 90 mg/dL (ref 70–99)
Glucose-Capillary: 73 mg/dL (ref 70–99)
Glucose-Capillary: 74 mg/dL (ref 70–99)

## 2021-10-10 LAB — COMPREHENSIVE METABOLIC PANEL
ALT: 92 U/L — ABNORMAL HIGH (ref 0–44)
AST: 355 U/L — ABNORMAL HIGH (ref 15–41)
Albumin: 2.7 g/dL — ABNORMAL LOW (ref 3.5–5.0)
Alkaline Phosphatase: 189 U/L — ABNORMAL HIGH (ref 38–126)
Anion gap: 16 — ABNORMAL HIGH (ref 5–15)
BUN: 24 mg/dL — ABNORMAL HIGH (ref 6–20)
CO2: 15 mmol/L — ABNORMAL LOW (ref 22–32)
Calcium: 7.3 mg/dL — ABNORMAL LOW (ref 8.9–10.3)
Chloride: 100 mmol/L (ref 98–111)
Creatinine, Ser: 1.85 mg/dL — ABNORMAL HIGH (ref 0.44–1.00)
GFR, Estimated: 31 mL/min — ABNORMAL LOW (ref >60)
Glucose, Bld: 68 mg/dL — ABNORMAL LOW (ref 70–99)
Potassium: 3.8 mmol/L (ref 3.5–5.1)
Sodium: 131 mmol/L — ABNORMAL LOW (ref 135–145)
Total Bilirubin: 37.7 mg/dL (ref 0.3–1.2)
Total Protein: 5.7 g/dL — ABNORMAL LOW (ref 6.5–8.1)

## 2021-10-10 LAB — PREPARE RBC (CROSSMATCH)

## 2021-10-10 LAB — PROTIME-INR
INR: 2.7 — ABNORMAL HIGH (ref 0.8–1.2)
Prothrombin Time: 28.5 seconds — ABNORMAL HIGH (ref 11.4–15.2)

## 2021-10-10 LAB — HEMOGLOBIN AND HEMATOCRIT, BLOOD
HCT: 23.5 % — ABNORMAL LOW (ref 36.0–46.0)
Hemoglobin: 8 g/dL — ABNORMAL LOW (ref 12.0–15.0)

## 2021-10-10 LAB — ABO/RH: ABO/RH(D): O POS

## 2021-10-10 IMAGING — MR MR ABDOMEN W/O CM
12 of 15 series · 39 of 48 positions shown · non-contrast
Comparison: CT abdomen pelvis, [DATE]

CLINICAL DATA: Liver failure, alcoholic hepatitis

EXAM:
MRI ABDOMEN WITHOUT CONTRAST
TECHNIQUE: Multiplanar multisequence MR imaging was performed without the
administration of intravenous contrast. Patient declined to complete
the full examination, limited sequences provided.

[Series 3: cor ssfse nav · coronal · 6.0mm · 0.78mm/px · 1 of 37 slices shown]
[im 1/37]
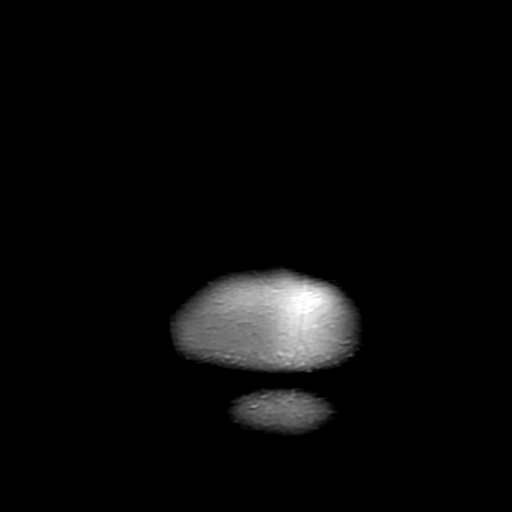

[Series 4: ax ssfse nav · axial · 6.0mm · 0.74mm/px · 1 of 53 slices shown]
[im 1/53]
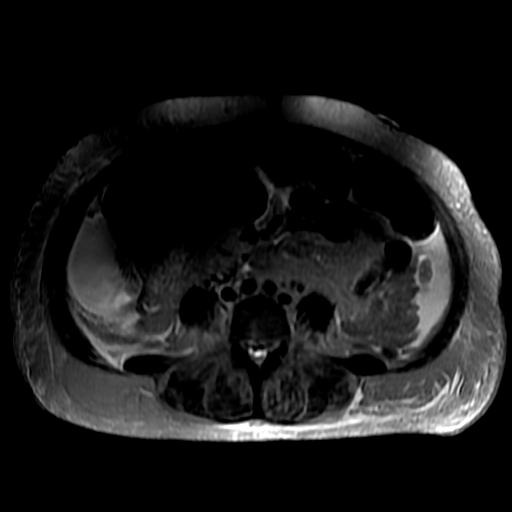

[Series 6: DWI b500 · axial · 8.0mm · 1.95mm/px · z∈[-182,+125]mm · 3 of 64 slices shown]
[im 1/64]
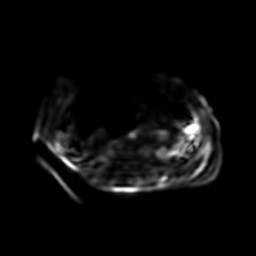
[im 32/64]
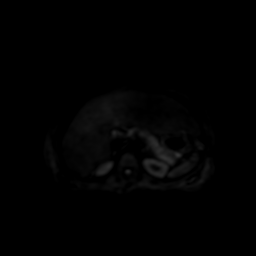
[im 64/64]
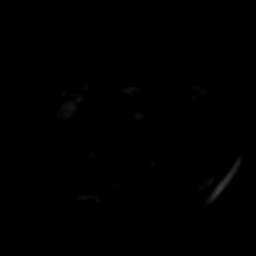

[Series 7: radial (id) nav · axial · 40.0mm · 0.74mm/px · 1 of 3 slices shown]
[im 1/3]
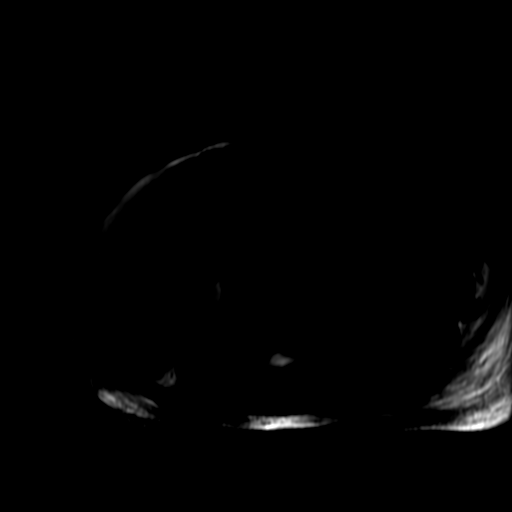

[Series 9: T2 fat-sat · axial · 6.0mm · 0.74mm/px · z∈[-127,+128]mm · 2 of 40 slices shown]
[im 1/40]
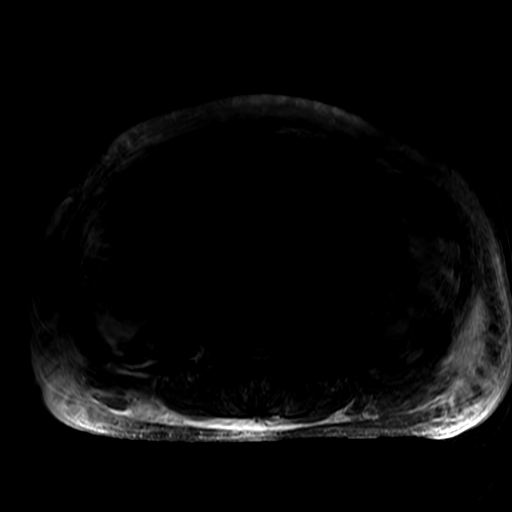
[im 40/40]
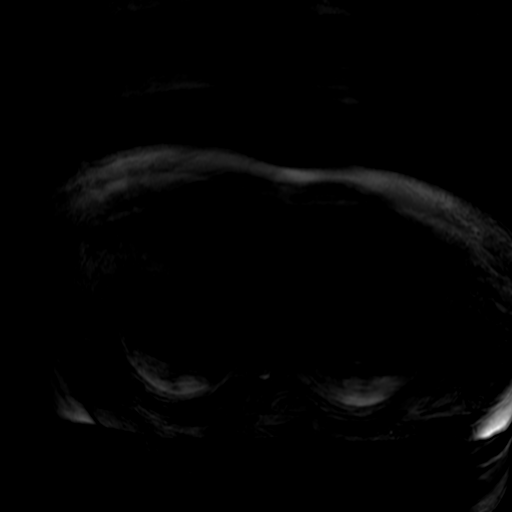

[Series 650: ADC · axial · 8.0mm · 1.95mm/px · 1 of 32 slices shown]
[im 1/32]
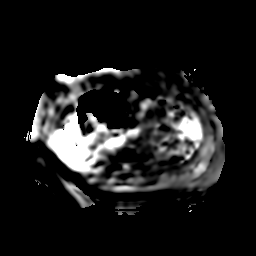

[Series 801: T1 dynamic · axial · non-contrast · 4.4mm · 1.72mm/px · z∈[-126,+125]mm · 5 of 116 slices shown (1 of 3)]
[im 1/116]
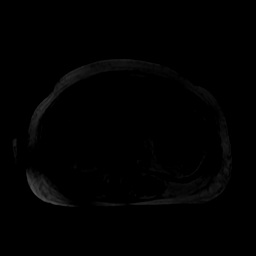
[im 29/116]
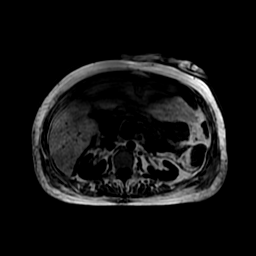
[im 58/116]
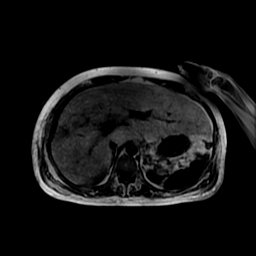
[im 87/116]
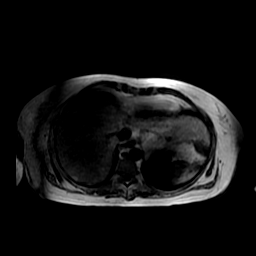
[im 116/116]
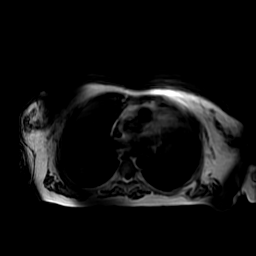

[Series 802: T1 dynamic · axial · non-contrast · 4.4mm · 1.72mm/px · z∈[-126,+125]mm · 5 of 116 slices shown (2 of 3)]
[im 1/116]
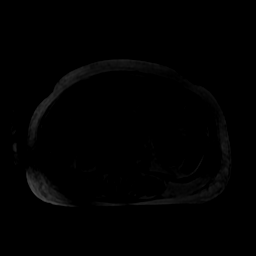
[im 29/116]
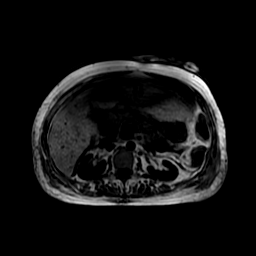
[im 58/116]
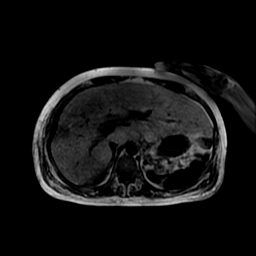
[im 87/116]
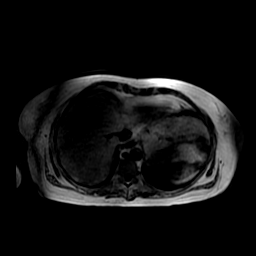
[im 116/116]
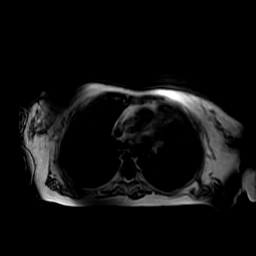

[Series 803: T1 dynamic · axial · non-contrast · 4.4mm · 1.72mm/px · z∈[-126,+125]mm · 5 of 116 slices shown (3 of 3)]
[im 1/116]
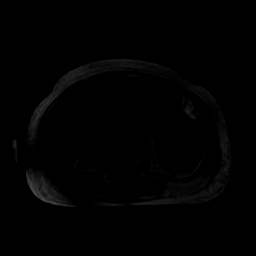
[im 29/116]
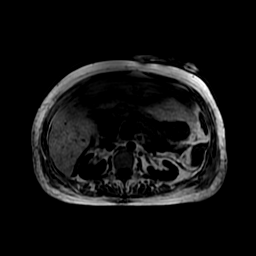
[im 58/116]
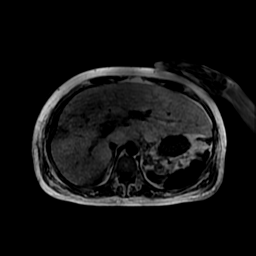
[im 87/116]
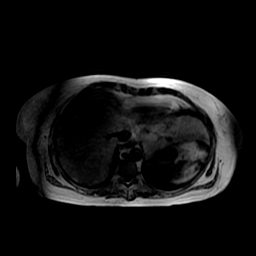
[im 116/116]
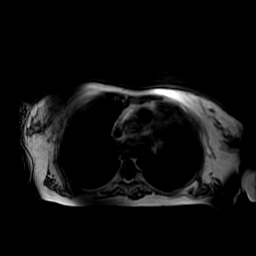

[((date))-((date)) · axial · 4.4mm · 1.72mm/px · z∈[-126,+125]mm · 5 of 116 slices shown (1 of 3)]
[im 1/116]
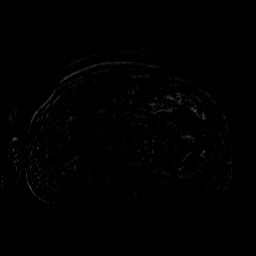
[im 29/116]
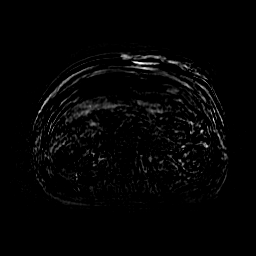
[im 58/116]
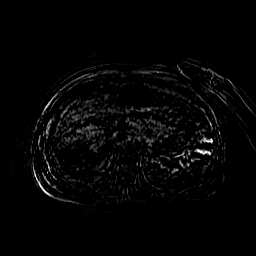
[im 87/116]
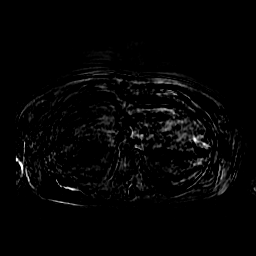
[im 116/116]
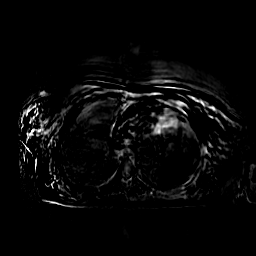

[((date))-((date)) · axial · 4.4mm · 1.72mm/px · z∈[-126,+125]mm · 5 of 114 slices shown (2 of 3)]
[im 1/114]
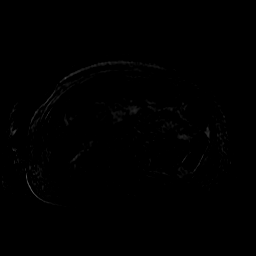
[im 29/114]
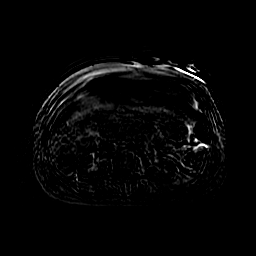
[im 57/114]
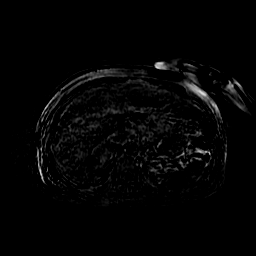
[im 85/114]
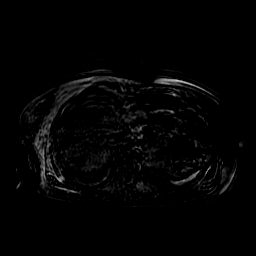
[im 114/114]
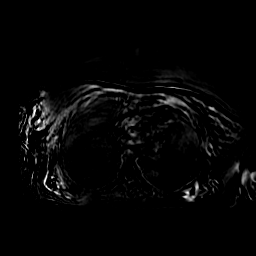

[((date))-((date)) · axial · 4.4mm · 1.72mm/px · z∈[-126,+125]mm · 5 of 116 slices shown (3 of 3)]
[im 1/116]
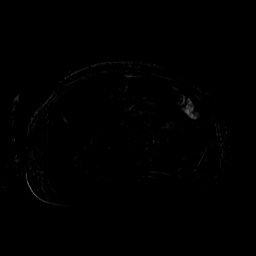
[im 29/116]
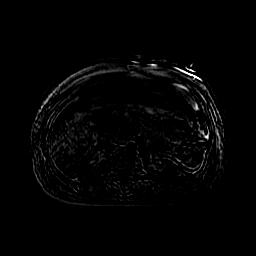
[im 58/116]
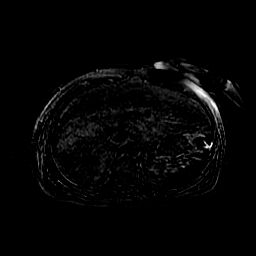
[im 87/116]
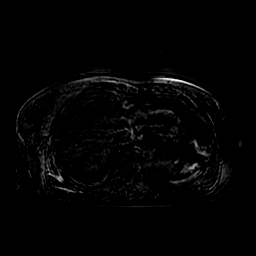
[im 116/116]
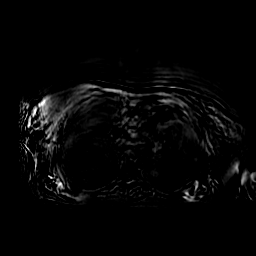

[39 of 48 positions shown; findings below may reference images not displayed]

FINDINGS: Incomplete examination provided for review is further significantly
limited by breath motion artifact throughout.

Lower chest: Small bilateral pleural effusions.  Cardiomegaly.

Hepatobiliary: Severe hepatomegaly. No obvious mass or other
discrete abnormality of the liver parenchyma. Distended gallbladder
again noted, likely containing sludge and tiny gallstones. No
biliary ductal dilatation.

Pancreas: No mass, inflammatory changes, or other parenchymal
abnormality identified.No pancreatic ductal dilatation.

Spleen:  Within normal limits in size and appearance.

Adrenals/Urinary Tract: Normal adrenal glands. Benign fat containing
angiomyolipoma of the midportion of the right kidney (series 4,
image 41). No renal masses or suspicious contrast enhancement
identified. No evidence of hydronephrosis.

Stomach/Bowel: Visualized portions within the abdomen are
unremarkable.

Vascular/Lymphatic: No pathologically enlarged lymph nodes
identified. No abdominal aortic aneurysm demonstrated.

Other:  Anasarca.  Small volume ascites throughout the abdomen.

Musculoskeletal: No suspicious osseous lesions identified.
IMPRESSION: 1. Incomplete examination provided for review is further
significantly limited by breath motion artifact throughout.
2. Within this limitation, severe hepatomegaly. No obvious mass or
other discrete abnormality of the liver parenchyma on noncontrast
MR.
3. Distended gallbladder again noted, likely containing sludge and
tiny gallstones. No biliary ductal dilatation.
4. Small volume ascites, pleural effusions, and anasarca.

## 2021-10-10 MED ORDER — HYDROMORPHONE HCL 1 MG/ML IJ SOLN
0.2500 mg | INTRAMUSCULAR | Status: DC | PRN
  Administered 2021-10-11 – 2021-10-13 (×6): 0.25 mg via INTRAVENOUS
  Filled 2021-10-10 (×7): qty 0.5

## 2021-10-10 MED ORDER — SODIUM CHLORIDE 0.9% IV SOLUTION: Freq: Once | INTRAVENOUS | Status: AC

## 2021-10-10 NOTE — Progress Notes (Signed)
Received a secure chat that patient had a large bloody bowel movement. It was noted to be dark in color and  appeared to have clots. Patient was in no acute distress when IMTS presented to bedside.  ? ?Patient remained responsive and at her baseline level of mentation.  ?  ?Exam: ?Hemodynamically stable, HR 80, BP 98/71 ?Gen: ill appearing, NAD  ?CV: RRR ?Resp: normal WOB ?GI: distended but soft abdomen with severe hepatomegaly, diffusely tender.  ?Rectal: Prolapse of bowel, bowel soft at anus, appears pink. Slow oozing bleed with occasional jet of blood.  ?Neuro:  A&Ox4, answering questions appropriately ?  ?Given dark color and mostly oozing nature of bleeding, appears venous. She does now appear to have prolapse of bowel which is new from earlier exam per nursing.  ? ?Reached out to GI regarding patient per previous IMTS note, and awaiting to hear back as of this note.  ?

## 2021-10-10 NOTE — Progress Notes (Signed)
Received a secure chat regarding [name]. Patient had a bowel movement that reportedly had 2-300ccs of a bowel movement containing bright red blood though this was mixed with some urine. After the bowel movement the patient continued to ooze brbpr. Went to bedside to assess. ? ?Patient is still A&Ox4 though endorses some worsening abdominal pain and says she feels worse than earlier. When asked how she is worse she says she is stressed out.  ? ?Exam: ?Hemodynamically stable, HR 80, BP 103/53 ?Gen: ill appearing diffusely jaundiced woman resting uncomfortably in bed ?CV: RRR ?Resp: Speaking in full sentences, normal WOB ?GI: distended but soft abdomen with severe hepatomegaly, diffusely tender.  ?Rectal: bright red blood trickling out of rectum ?Neuro:  A&Ox4, answering questions appropriately ? ?A/P: ?Despite bleed patient is not tachycardic and pressures are on the lower side however they have been running at about 90-100 since admission. She is hemodynamically stable at this time.Transfusing a second unit of pRBCs now. Nurse is contacting vein team to get another IV as current IV is not very good and patient is a difficult stick. Holding dilaudid right now intermittent hypotension. Spoke with GI on the phone who recommended frequent reassessment and if she has another large bleed or becomes hemodynamically unstable or a change in mentation to reach back out and they will do a flex sig.  ?- contact GI if patient deteriorates or further large volume bleed is witness ?- transfuse 1 u, f/u H/H ?- q4 hr CBC ?

## 2021-10-10 NOTE — Progress Notes (Signed)
? ?HD#7 ?SUBJECTIVE:  ?Patient Summary: Judy Meyer is a 61 y.o. with a pertinent PMH of alcohol associated hepatitis, hypothyroidism, hypertension, seizures, anemia, hyperlipidemia and alcohol use disorder who presented with worsening of her jaundice, nausea, vomiting and admitted for acute alcoholic hepatitis.  ? ?Overnight Events: None ? ?Interim History: Patient overall feels okay this morning but is having continued moderate-severe abdominal pain, right upper quadrant most affected.  She has support from her optic and, her boyfriend.  I explained that I was going to have our palliative care team come speak with her as she is still very sick. ? ?OBJECTIVE:  ?Vital Signs: ?Vitals:  ? 10/09/21 1706 10/09/21 2000 10/10/21 0000 10/10/21 3329  ?BP: 104/69 105/61 115/63 100/60  ?Pulse:  75 76 74  ?Resp: '18 14 15 14  '$ ?Temp: 98.4 ?F (36.9 ?C) (!) 97.5 ?F (36.4 ?C) 97.6 ?F (36.4 ?C) 97.8 ?F (36.6 ?C)  ?TempSrc: Oral Oral Oral Oral  ?SpO2:  97%  99%  ?Weight:      ?Height:      ? ?Supplemental O2: Room Air ?SpO2: 99 % ?O2 Flow Rate (L/min): 2 L/min ?FiO2 (%): 21 % ? ?Filed Weights  ? 10/04/21 0337  ?Weight: 63.7 kg  ? ? ? ?Intake/Output Summary (Last 24 hours) at 10/10/2021 0752 ?Last data filed at 10/10/2021 5188 ?Gross per 24 hour  ?Intake 619.08 ml  ?Output --  ?Net 619.08 ml  ? ?Net IO Since Admission: 4,609.53 mL [10/10/21 0752] ? ?Physical Exam: ?Constitutional: Chronically ill-appearing woman resting in bed.  No acute distress. ?Eyes: Icteric sclera. ?Cardio: Regular rate and rhythm.  No murmurs, rubs, caps. ?Pulm: Clear to auscultation bilaterally.  No work of breathing on room air. ?Abdomen: Diffusely tender to palpation with right upper quadrant most affected. ?MSK: Negative for extremity edema. ?Skin: Skin is jaundiced. ?Neuro: Alert and oriented x3.  No focal deficit noted. ?Psych: Normal mood and affect. ? ?Patient Lines/Drains/Airways Status   ? ? Active Line/Drains/Airways   ? ? Name Placement date  Placement time Site Days  ? Peripheral IV 10/08/21 22 G 1" Left;Anterior Forearm 10/08/21  0547  Forearm  2  ? External Urinary Catheter 10/09/21  1125  --  1  ? ?  ?  ? ?  ? ? ? ?ASSESSMENT/PLAN:  ?Assessment: ?Principal Problem: ?  Alcoholic hepatitis ?Active Problems: ?  Hypokalemia ?  Hyperbilirubinemia ?  Acidosis, metabolic ?  Leukocytosis ?  Lactic acidosis ?  Acute liver failure with hepatic coma ([place]) ?  Abnormal gallbladder ultrasound ? ? ?Plan: ?Judy Meyer is a 61 y.o. with a pertinent PMH of alcohol associated hepatitis, hypothyroidism, hypertension, seizures, anemia, hyperlipidemia and alcohol use disorder who presented with worsening of her jaundice, nausea, vomiting and admitted for acute alcoholic hepatitis now in acute liver failure.  ?  ?#Acute liver failure c/b hepatorenal syndrome ?#Acute alcoholic hepatitis c/b acute cholecystitis  ?Unfortunately patient did not show improvement with prednisone therapy and it was stopped 03/03.  Aminotransferases continue to be significantly elevated with further increase of total bilirubin at 37.7.  PT/INR further increasing and hemoglobin is now 6.9.  Mental status is improved despite liver failure.  Leukocytosis persists and continues to increase each day. ?-GI following, appreciate their assistance ? -Notes that HIDA can help diagnostically however could be hindered by severe hepatitis.  MRI could be beneficial in discerning CT abdomen findings, cirrhosis versus malignancy. ? -Recommend obtaining HIDA, MRI liver ?-Surgery re-consulted 03/03 to evaluate patient's severely distended gallbladder ?-IR  consulted for percutaneous drain placement, requesting HIDA prior to procedure.  Unfortunately she will not be able to have the scan done until Monday.  We will reach out to the IR team and see if they would be willing/able to do this procedure prior to HIDA scan completion ?-Continue zosyn IV 3.375 g TID, day 7/7 ?-Lactulose 20 g 3 times daily, goal of 3 BMs  per day ?-Rifaximin 550 mg BID ?-Thiamine 100 mg daily ?-Trend CMP, PT/INR ?-We will give 1 unit PRBC.  Follow-up posttransfusion H&H. ?  ?Persistent mild anion gap metabolic acidosis ?Acute kidney injury ?Serum creatinine 1.85, has been trending upwards over the course of this admission.  AGMA likely 2/2 lactic acidosis and continued worsening of renal function. Bicarbonate had previously stabilized but is now worsening again.  ?-Trend CMP ?-Replete electrolytes as needed ?  ?High risk for alcohol withdrawal ?Alcohol Use Disorder ?Patient is high risk for severe alcohol withdrawal. Currently on CIWA protocol without Ativan. Continue to monitor and if begins showing signs of withdrawal, can use ativan. ?  ?Hx of seizure disorder ?Continue keppra 500 mg twice daily. ? ?Best Practice: ?Diet: Soft  ?IVF: Fluids: 0.9NS, Rate:  100 cc/h ?VTE: enoxaparin (LOVENOX) injection 40 mg Start: 10/04/2021 2206 ?SCDs Start: 09/09/2021 2027 ?Code: Full ?AB: Zosyn 3.375 g TID, day 3/7 ?DISPO: Anticipated discharge in 3-4 days pending Medical stability. ? ?Signature: ?Farrel Gordon, D.O.  ?Internal Medicine Resident, PGY-1 ?[facility]  ?Pager: [phone] ?7:52 AM, 10/10/2021  ? ?Please contact the on call pager after 5 pm and on weekends at [phone]. ? ?

## 2021-10-10 NOTE — Plan of Care (Signed)

## 2021-10-10 NOTE — Consult Note (Signed)
Chief Complaint: Patient was seen in consultation today for percutaneous cholecystostomy drain placement Chief Complaint  Patient presents with   Jaundice   Abdominal Pain   at the request of Dr Mont Dutton  Referring Physician(s): Dr Benny Lennert  Supervising Physician: Juliet Rude  Patient Status: [facility] - In-pt  History of Present Illness: Judy Meyer is a 61 y.o. female   ETOH Hepatitis HTN; Sz disorder; HLD Worsening jaundice; N/V Abd pain; RUQ Worsening liver enzymes  Followed by IM Service  MR Liver today IMPRESSION: 1. Incomplete examination provided for review is further significantly limited by breath motion artifact throughout. 2. Within this limitation, severe hepatomegaly. No obvious mass or other discrete abnormality of the liver parenchyma on noncontrast MR. 3. Distended gallbladder again noted, likely containing sludge and tiny gallstones. No biliary ductal dilatation. 4. Small volume ascites, pleural effusions, and anasarca.  Request made for percutaneous cholecystostomy drain placement  GI note today: Assessment/Plan: 1) Alcoholic hepatitis. 2) Worsening liver enzymes. 3) Cholelithiasis.  ? Cholecystitis. 4) Liver lesions.             The patient's liver enzymes are worsening.  Her steroids were stopped.  There is the question of an acute cholecystitis, which is a possibility given her abdominal pain and worsening liver enzymes.  A HIDA scan can help, but it can be hindered with the severe hepatitis.  A surgical evaluation for the possibility of an acute cholecystitis is appropriate.  As for the findings on the CT scan, an MRI will be beneficial to discern if this is associated with cirrhosis versus malignant. Plan: 1) HIDA. 2) Consult Surgery. 3) MRI of the liver.  Dr Denna Haggard has reviewed imaging He approves percutaneous cholecystostomy drain placement I have seen and examined the pt IR procedure scheduled for 3/5 am---- pt npo after MN and  Lovenox held tonight    Past Medical History:  Diagnosis Date   Hypertension    Hypothyroidism    Liver disease    Uterine fibroid     Past Surgical History:  Procedure Laterality Date   hand fracture surgery Right    LIVER BIOPSY      Allergies: Compazine [prochlorperazine edisylate], Haloperidol, Periactin [cyproheptadine], Cyclobenzaprine, Haloperidol and related, and Metoclopramide  Medications: Prior to Admission medications   Medication Sig Start Date End Date Taking? Authorizing Provider  omeprazole (PRILOSEC) 20 MG capsule Take 20 mg by mouth daily.   Yes [provider]  albuterol (VENTOLIN HFA) 108 (90 Base) MCG/ACT inhaler Inhale 2 puffs into the lungs 4 (four) times daily as needed for wheezing or shortness of breath. Patient not taking: Reported on 09/28/2021 12/20/19   [provider]  amLODipine (NORVASC) 10 MG tablet Take 1 tablet (10 mg total) by mouth daily. Patient not taking: Reported on 09/30/2021 11/21/20   Mercy Riding, MD  levETIRAcetam (KEPPRA) 500 MG tablet Take 1 tablet (500 mg total) by mouth 2 (two) times daily. Patient not taking: Reported on 09/18/2021 03/20/21   Hosie Poisson, MD  levothyroxine (SYNTHROID) 75 MCG tablet Take 1 tablet (75 mcg total) by mouth daily at 6 (six) AM. Patient not taking: Reported on 09/11/2021 03/21/21   Hosie Poisson, MD  ondansetron (ZOFRAN) 4 MG tablet Take 1 tablet (4 mg total) by mouth daily as needed for nausea or vomiting. Patient not taking: Reported on 09/20/2021 11/20/20 11/20/21  Mercy Riding, MD  ondansetron (ZOFRAN) 4 MG tablet Take 1 tablet (4 mg total) by mouth every  6 (six) hours. Patient not taking: Reported on 09/11/2021 07/25/21   Valarie Merino, MD  potassium chloride SA (KLOR-CON) 20 MEQ tablet Take 1 tablet (20 mEq total) by mouth daily. Patient not taking: Reported on 09/13/2021 03/20/21   Hosie Poisson, MD  venlafaxine XR (EFFEXOR-XR) 75 MG 24 hr capsule Take 1 capsule (75 mg total) by  mouth daily. Patient not taking: Reported on 09/20/2021 11/20/20   Mercy Riding, MD     Family History  Problem Relation Age of Onset   Hypertension Other    Hypertension Mother    High Cholesterol Mother     Social History   Socioeconomic History   Marital status: Single    Spouse name: Not on file   Number of children: Not on file   Years of education: Not on file   Highest education level: Not on file  Occupational History   Not on file  Tobacco Use   Smoking status: Never   Smokeless tobacco: Never  Vaping Use   Vaping Use: Some days   Start date: 09/09/2020  Substance and Sexual Activity   Alcohol use: Yes   Drug use: Never   Sexual activity: Yes  Other Topics Concern   Not on file  Social History Narrative   Not on file   Social Determinants of Health   Financial Resource Strain: Not on file  Food Insecurity: Not on file  Transportation Needs: Not on file  Physical Activity: Not on file  Stress: Not on file  Social Connections: Not on file    Review of Systems: A 12 point ROS discussed and pertinent positives are indicated in the HPI above.  All other systems are negative.  Review of Systems  Constitutional:  Positive for activity change, appetite change and fatigue.  Respiratory:  Negative for cough and shortness of breath.   Cardiovascular:  Negative for chest pain.  Gastrointestinal:  Positive for abdominal distention, abdominal pain, nausea and vomiting.  Musculoskeletal:  Negative for back pain.  Neurological:  Positive for weakness.  Psychiatric/Behavioral:  Negative for behavioral problems and confusion.    Vital Signs: BP (!) 92/51    Pulse 86    Temp (!) 97.5 F (36.4 C) (Oral)    Resp 20    Ht '5\' 6"'$  (1.676 m)    Wt 140 lb 6.9 oz (63.7 kg)    LMP 06/11/2018    SpO2 100%    BMI 22.67 kg/m   Physical Exam Vitals reviewed.  HENT:     Mouth/Throat:     Mouth: Mucous membranes are moist.  Cardiovascular:     Rate and Rhythm: Normal rate and  regular rhythm.  Pulmonary:     Effort: Pulmonary effort is normal.     Breath sounds: Normal breath sounds.  Abdominal:     General: There is distension.     Palpations: There is no mass.     Tenderness: There is abdominal tenderness in the epigastric area.  Skin:    General: Skin is warm.     Coloration: Skin is jaundiced.  Neurological:     Mental Status: She is alert and oriented to person, place, and time.  Psychiatric:        Behavior: Behavior normal.    Imaging: CT ABDOMEN W CONTRAST  Result Date: 10/09/2021 CLINICAL DATA:  Jaundice. EXAM: CT ABDOMEN WITH CONTRAST TECHNIQUE: Multidetector CT imaging of the abdomen was performed using the standard protocol following bolus administration of intravenous contrast. RADIATION DOSE  REDUCTION: This exam was performed according to the departmental dose-optimization program which includes automated exposure control, adjustment of the mA and/or kV according to patient size and/or use of iterative reconstruction technique. CONTRAST:  51m OMNIPAQUE IOHEXOL 300 MG/ML  SOLN COMPARISON:  07/25/2021 FINDINGS: Lower chest: Bilateral dependent collapse/consolidative disease noted in both lungs with small bilateral pleural effusions. Hepatobiliary: Marked hepatomegaly with nodular, diffusely decreased attenuation of liver parenchyma. Gallbladder is massively distended measuring 6.5 cm diameter. Tiny calcified gallstone identified towards the neck. No intrahepatic or extrahepatic biliary dilation. Pancreas: No focal mass lesion. No dilatation of the main duct. No intraparenchymal cyst. No peripancreatic edema. Spleen: No splenomegaly. No focal mass lesion. Adrenals/Urinary Tract: No adrenal nodule or mass. 2.6 cm fat density lesion posterior lower interpolar right kidney is stable compatible with angiomyolipoma small nonobstructing stone again noted lower pole right kidney. Left kidney unremarkable. Stomach/Bowel: Stomach is unremarkable. No gastric wall  thickening. No evidence of outlet obstruction. Duodenum is normally positioned as is the ligament of Treitz. No small bowel dilatation within the visualized abdomen. Abdominal segments of the colon are filled with gas and fluid, demonstrating mild distension. Curvilinear focus of gas projects in the fluid of the right paracolic gutter but is within a nondilated appendix. Vascular/Lymphatic: There is mild atherosclerotic calcification of the abdominal aorta without aneurysm. There is no gastrohepatic or hepatoduodenal ligament lymphadenopathy. No retroperitoneal or mesenteric lymphadenopathy. Other: Small volume perihepatic and perisplenic fluid associated with fluid layering in both para colic gutters. Musculoskeletal: No worrisome lytic or sclerotic osseous abnormality. IMPRESSION: 1. Marked hepatomegaly with nodular, diffusely decreased attenuation of liver parenchyma. This represents a marked change since 07/25/2021. Portal and hepatic venous anatomy in the liver parenchyma shows areas of mass effect but does not appear markedly distorted. Imaging features may reflect marked progression of fatty deposition within the liver parenchyma, but given the nodular parenchyma, cirrhotic disease, innumerable metastases, or infiltrative process cannot be excluded. MRI abdomen with and without contrast may provide additional characterization. 2. Massive distention of the gallbladder with tiny calcified gallstone. 3. Small volume ascites. 4. Bilateral dependent collapse/consolidative disease in both lungs with small bilateral pleural effusions. 5. 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 6. Aortic Atherosclerosis (ICD10-I70.0). Electronically Signed   By: EMisty StanleyM.D.   On: 10/09/2021 06:08   [place]domen Complete  Result Date: 09/11/2021 CLINICAL DATA:  Acute on chronic liver disease with jaundice, earlier study today showing gallbladder thickening and positive sonographic Murphy's sign. EXAM: ABDOMEN ULTRASOUND  COMPLETE COMPARISON:  The CT with IV contrast 07/25/2021. FINDINGS: Gallbladder: There is hypoechoic layering sludge without visible shadowing stones. There is trace pericholecystic fluid. The free wall is thickened up to 8.1 mm and there is positive right upper abdominal tenderness concerning for acute cholecystitis. The wall thickening and positive right upper abdominal tenderness could alternatively have a basis in hepatic dysfunction and/or hepatitis or other adjacent inflammatory process. Wall thickening was not seen on the 07/25/2021 CT. Common bile duct: Diameter: Prominent measuring 8.1 mm, but previously measured 7 mm. No intrahepatic biliary dilatation. Liver: No focal lesion identified through the patient's steatosis. The liver is diffusely attenuating consistent with fatty replacement, with moderate to severe fatty replacement noted on the CT. The liver length was not measured today but was previously 17 cm. Portal vein is patent on color Doppler imaging with reversal of flow. It is otherwise not well seen today but previously measured prominent at 14 mm. IVC: No abnormality visualized. Pancreas: Body of the pancreas is visible  and unremarkable. The head, neck and tail segments are obscured by bowel gas. Spleen: Size and appearance within normal limits. Right Kidney: Length: 11.4 cm. Cortical echogenicity and thickness within normal limits. No hydronephrosis or stone visualized. Again noted is a 3.5 cm mass posteriorly with uniform increased echogenicity consistent with the angiomyolipoma noted on the CT, lower pole. No other mass is seen. There was a 3 mm lower pole caliceal stone on the CT but it is not visible sonographically , possibly obscured by bowel gas shadowing or passed in the interval. Left Kidney: Length: 9.5 cm. Echogenicity within normal limits. No mass, stone or hydronephrosis visualized. Abdominal aorta: No aneurysm visualized. Other findings: No ascites. IMPRESSION: 1. Gallbladder  sludge without shadowing stones, with wall thickening, pericholecystic fluid and positive sonographic Murphy sign. Findings suspicious for cholecystitis. With hepatic dysfunction, alternative etiology could be congestive or reactive thickening such as with hepatitis, or adjacent inflammatory process. 2. The pancreas mostly obscured but normal where visible. 3. Prominent common bile duct.  No intrahepatic biliary prominence. 4. Diffuse liver attenuation consistent with the fatty replacement noted on the CT. 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 6. Reversed flow in the hepatic portal vein. The portal vein was otherwise not well enough visualized to measure its diameter but it was previously slightly prominent. Electronically Signed   By: Telford Nab M.D.   On: 09/18/2021 22:45   MR LIVER WO CONRTAST  Result Date: 10/10/2021 CLINICAL DATA:  Liver failure, alcoholic hepatitis EXAM: MRI ABDOMEN WITHOUT CONTRAST TECHNIQUE: Multiplanar multisequence MR imaging was performed without the administration of intravenous contrast. Patient declined to complete the full examination, limited sequences provided. COMPARISON:  CT abdomen pelvis, 10/09/2021 FINDINGS: Incomplete examination provided for review is further significantly limited by breath motion artifact throughout. Lower chest: Small bilateral pleural effusions.  Cardiomegaly. Hepatobiliary: Severe hepatomegaly. No obvious mass or other discrete abnormality of the liver parenchyma. Distended gallbladder again noted, likely containing sludge and tiny gallstones. No biliary ductal dilatation. Pancreas: No mass, inflammatory changes, or other parenchymal abnormality identified.No pancreatic ductal dilatation. Spleen:  Within normal limits in size and appearance. Adrenals/Urinary Tract: Normal adrenal glands. 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 No renal masses or suspicious contrast enhancement identified. No  evidence of hydronephrosis. Stomach/Bowel: Visualized portions within the abdomen are unremarkable. Vascular/Lymphatic: No pathologically enlarged lymph nodes identified. No abdominal aortic aneurysm demonstrated. Other:  Anasarca.  Small volume ascites throughout the abdomen. Musculoskeletal: No suspicious osseous lesions identified. IMPRESSION: 1. Incomplete examination provided for review is further significantly limited by breath motion artifact throughout. 2. Within this limitation, severe hepatomegaly. No obvious mass or other discrete abnormality of the liver parenchyma on noncontrast MR. 3. Distended gallbladder again noted, likely containing sludge and tiny gallstones. No biliary ductal dilatation. 4. Small volume ascites, pleural effusions, and anasarca. Electronically Signed   By: Delanna Ahmadi M.D.   On: 10/10/2021 13:22   DG CHEST PORT 1 VIEW  Result Date: 09/23/2021 CLINICAL DATA:  Jaundice, abdominal pain and sepsis. EXAM: PORTABLE CHEST 1 VIEW COMPARISON:  PA Lat 05/14/2006. FINDINGS: The heart size and mediastinal contours are within normal limits. Both lungs are clear apart from linear scarring or atelectasis laterally in the left base. The visualized skeletal structures are unremarkable. IMPRESSION: No evidence of acute chest disease. Electronically Signed   By: Telford Nab M.D.   On: 09/17/2021 21:03   US LIVER DOPPLER  Result Date: 09/09/2021 CLINICAL DATA:  Jaundice, acute  on chronic liver disease EXAM: DUPLEX ULTRASOUND OF LIVER TECHNIQUE: Color and duplex Doppler ultrasound was performed to evaluate the hepatic in-flow and out-flow vessels. COMPARISON:  CT 07/25/2021 FINDINGS: Liver: Diffusely increased parenchymal echogenicity with extremely poor acoustic through transmission in keeping with changes of severe hepatic steatosis. Assessment of the hepatic parenchyma is resultantly markedly limited. Limited images of the hepatic contour demonstrate no obvious nodularity. No definite  focal intrahepatic mass identified. Main Portal Vein size: 12 mm cm Portal Vein Velocities Main Prox:  -63 cm/sec Main Mid: -55 cm/sec Main Dist:  -79 cm/sec Right: -38 cm/sec Left: 35 cm/sec Hepatic Vein Velocities Right:  17 cm/sec Middle:  30 cm/sec Left:  24 cm/sec IVC: Patent Hepatic Artery Velocity:  67 cm/sec Splenic Vein Velocity:  14 cm/sec Spleen: 6.9 cm x 10.4 cm x 8.3 cm with a total volume of 313 cm^3 (411 cm^3 is upper limit normal) Portal Vein Occlusion/Thrombus: No Splenic Vein Occlusion/Thrombus: No Ascites: None Varices: None Superior mesenteric vein appears patent centrally. IMPRESSION: Markedly limited examination secondary to severe hepatic steatosis and resultant poor acoustic through transmission. Hepatofugal flow within the right portal vein and main portal vein. Preserved antegrade flow within the left portal vein. Splenic vein and superior mesenteric vein appear patent. Electronically Signed   By: Fidela Salisbury M.D.   On: 09/27/2021 22:42   US Abdomen Limited RUQ (LIVER/GB)  Result Date: 09/25/2021 CLINICAL DATA:  RIGHT-sided abdominal pain.  Jaundice EXAM: ULTRASOUND ABDOMEN LIMITED RIGHT UPPER QUADRANT COMPARISON:  CT 07/25/2021, ultrasound 07/24/2021 FINDINGS: Gallbladder: Sludge within the gallbladder. The gallbladder wall is thickened. Positive sonographic Murphy's sign. Common bile duct: Diameter: Normal at 4 mm Liver: Increased echogenicity. Portal vein is patent on color Doppler imaging with normal direction of blood flow towards the liver. Other: None. IMPRESSION: Pericholecystic fluid, gallbladder wall thickening and positive sonographic Murphy's sign are concerning for acute cholecystitis. Electronically Signed   By: Suzy Bouchard M.D.   On: 09/22/2021 18:07    Labs:  CBC: Recent Labs    10/06/21 0519 10/08/21 0646 10/09/21 0131 10/10/21 0824  WBC 19.7* 30.8* 32.7* 35.0*  HGB 8.5* 8.9* 8.7* 6.9*  HCT 23.8* 26.3* 26.0* 20.7*  PLT 199 217 245 192     COAGS: Recent Labs    11/16/20 0908 11/17/20 0437 10/07/21 0254 10/08/21 0646 10/09/21 0131 10/10/21 0824  INR  --    < > 2.0* 2.5* 2.5* 2.7*  APTT 26  --   --   --   --   --    < > = values in this interval not displayed.    BMP: Recent Labs    10/07/21 1427 10/08/21 0646 10/09/21 0131 10/10/21 0824  NA 134* 138 132* 131*  K 3.4* 3.1* 3.2* 3.8  CL 104 104 103 100  CO2 17* 20* 16* 15*  GLUCOSE 118* 87 98 68*  BUN '12 13 18 '$ 24*  CALCIUM 7.6* 7.5* 7.2* 7.3*  CREATININE 1.32* 1.33* 1.56* 1.85*  GFRNONAA 47* 46* 38* 31*    LIVER FUNCTION TESTS: Recent Labs    10/07/21 1427 10/08/21 0646 10/09/21 0131 10/10/21 0824  BILITOT 32.7* 33.8* 34.1* 37.7*  AST 370* 387* 406* 355*  ALT 100* 107* 110* 92*  ALKPHOS 145* 152* 206* 189*  PROT 6.0* 5.7* 6.0* 5.7*  ALBUMIN 2.1* 1.9* 2.0* 2.7*    TUMOR MARKERS: No results for input(s): AFPTM, CEA, CA199, CHROMGRNA in the last 8760 hours.  Assessment and Plan:  Scheduled for percutaneous cholecystostomy drain placement in  IR 10/24/2021 INR 2.7 today Will check fibrinogen per Dr Denna Haggard Will keep npo after MN Will HOLD Lovenox this pm  Risks and benefits discussed with the patient including, but not limited to bleeding, infection, gallbladder perforation, bile leak, sepsis or even death.  All of the patient's questions were answered, patient is agreeable to proceed. Consent signed and in chart.   Thank you for this interesting consult.  I greatly enjoyed meeting Latriece A Yorks and look forward to participating in their care.  A copy of this report was sent to the requesting provider on this date.  Electronically Signed: Lavonia Drafts, PA-C 10/10/2021, 2:38 PM   I spent a total of 40 Minutes    in face to face in clinical consultation, greater than 50% of which was counseling/coordinating care for percutaneous cholecystostomy drain placement

## 2021-10-10 NOTE — Progress Notes (Signed)
Subjective: ?No acute events.  She does not feel well. ? ?Objective: ?Vital signs in last 24 hours: ?Temp:  [97.5 ?F (36.4 ?C)-98.4 ?F (36.9 ?C)] 97.8 ?F (36.6 ?C) (03/04 7510) ?Pulse Rate:  [74-87] 74 (03/04 0337) ?Resp:  [14-23] 14 (03/04 2585) ?BP: (85-115)/(50-69) 100/60 (03/04 0337) ?SpO2:  [92 %-100 %] 99 % (03/04 0337) ?Last BM Date : 10/09/21 ? ?Intake/Output from previous day: ?03/03 0701 - 03/04 0700 ?In: 465.1 [I.V.:80.3; IV Piggyback:384.9] ?Out: -  ?Intake/Output this shift: ?No intake/output data recorded. ? ?General appearance: alert and severely jaundiced ?GI: ? Distension, some tenderness in the upper abdomen ? ?Lab Results: ?Recent Labs  ?  10/08/21 ?0646 10/09/21 ?0131  ?WBC 30.8* 32.7*  ?HGB 8.9* 8.7*  ?HCT 26.3* 26.0*  ?PLT 217 245  ? ?BMET ?Recent Labs  ?  10/07/21 ?1427 10/08/21 ?2778 10/09/21 ?0131  ?NA 134* 138 132*  ?K 3.4* 3.1* 3.2*  ?CL 104 104 103  ?CO2 17* 20* 16*  ?GLUCOSE 118* 87 98  ?BUN '12 13 18  '$ ?CREATININE 1.32* 1.33* 1.56*  ?CALCIUM 7.6* 7.5* 7.2*  ? ?LFT ?Recent Labs  ?  10/09/21 ?0131  ?PROT 6.0*  ?ALBUMIN 2.0*  ?AST 406*  ?ALT 110*  ?ALKPHOS 206*  ?BILITOT 34.1*  ? ?PT/INR ?Recent Labs  ?  10/08/21 ?0646 10/09/21 ?0131  ?LABPROT 26.9* 26.9*  ?INR 2.5* 2.5*  ? ?Hepatitis Panel ?No results for input(s): HEPBSAG, HCVAB, HEPAIGM, HEPBIGM in the last 72 hours. ?C-Diff ?No results for input(s): CDIFFTOX in the last 72 hours. ?Fecal Lactopherrin ?No results for input(s): FECLLACTOFRN in the last 72 hours. ? ?Studies/Results: ?CT ABDOMEN W CONTRAST ? ?Result Date: 10/09/2021 ?CLINICAL DATA:  Jaundice. EXAM: CT ABDOMEN WITH CONTRAST TECHNIQUE: Multidetector CT imaging of the abdomen was performed using the standard protocol following bolus administration of intravenous contrast. RADIATION DOSE REDUCTION: This exam was performed according to the departmental dose-optimization program which includes automated exposure control, adjustment of the mA and/or kV according to patient size and/or  use of iterative reconstruction technique. CONTRAST:  30m OMNIPAQUE IOHEXOL 300 MG/ML  SOLN COMPARISON:  07/25/2021 FINDINGS: Lower chest: Bilateral dependent collapse/consolidative disease noted in both lungs with small bilateral pleural effusions. Hepatobiliary: Marked hepatomegaly with nodular, diffusely decreased attenuation of liver parenchyma. Gallbladder is massively distended measuring 6.5 cm diameter. Tiny calcified gallstone identified towards the neck. No intrahepatic or extrahepatic biliary dilation. Pancreas: No focal mass lesion. No dilatation of the main duct. No intraparenchymal cyst. No peripancreatic edema. Spleen: No splenomegaly. No focal mass lesion. Adrenals/Urinary Tract: No adrenal nodule or mass. 2.6 cm fat density lesion posterior lower interpolar right kidney is stable compatible with angiomyolipoma small nonobstructing stone again noted lower pole right kidney. Left kidney unremarkable. Stomach/Bowel: Stomach is unremarkable. No gastric wall thickening. No evidence of outlet obstruction. Duodenum is normally positioned as is the ligament of Treitz. No small bowel dilatation within the visualized abdomen. Abdominal segments of the colon are filled with gas and fluid, demonstrating mild distension. Curvilinear focus of gas projects in the fluid of the right paracolic gutter but is within a nondilated appendix. Vascular/Lymphatic: There is mild atherosclerotic calcification of the abdominal aorta without aneurysm. There is no gastrohepatic or hepatoduodenal ligament lymphadenopathy. No retroperitoneal or mesenteric lymphadenopathy. Other: Small volume perihepatic and perisplenic fluid associated with fluid layering in both para colic gutters. Musculoskeletal: No worrisome lytic or sclerotic osseous abnormality. IMPRESSION: 1. Marked hepatomegaly with nodular, diffusely decreased attenuation of liver parenchyma. This represents a marked change since 07/25/2021. Portal  and hepatic venous  anatomy in the liver parenchyma shows areas of mass effect but does not appear markedly distorted. Imaging features may reflect marked progression of fatty deposition within the liver parenchyma, but given the nodular parenchyma, cirrhotic disease, innumerable metastases, or infiltrative process cannot be excluded. MRI abdomen with and without contrast may provide additional characterization. 2. Massive distention of the gallbladder with tiny calcified gallstone. 3. Small volume ascites. 4. Bilateral dependent collapse/consolidative disease in both lungs with small bilateral pleural effusions. 5. 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 6. Aortic Atherosclerosis (ICD10-I70.0). Electronically Signed   By: Misty Stanley M.D.   On: 10/09/2021 06:08   ? ?Medications: Scheduled: ? chlorhexidine  15 mL Mouth Rinse BID  ? enoxaparin (LOVENOX) injection  40 mg Subcutaneous Q24H  ? feeding supplement  237 mL Oral BID BM  ? folic acid  1 mg Oral Daily  ? lactulose  20 g Oral TID  ? levETIRAcetam  500 mg Oral BID  ? levothyroxine  75 mcg Oral Q0600  ? mouth rinse  15 mL Mouth Rinse q12n4p  ? multivitamin with minerals  1 tablet Oral Daily  ? pantoprazole  40 mg Oral Q0600  ? rifaximin  550 mg Oral BID  ? sodium bicarbonate  1,300 mg Oral BID  ? thiamine  100 mg Oral Daily  ? ?Continuous: ? sodium chloride Stopped (10/09/21 1530)  ? albumin human 25 g (10/09/21 2304)  ? piperacillin-tazobactam (ZOSYN)  IV 3.375 g (10/10/21 0145)  ? ? ?Assessment/Plan: ?1) Alcoholic hepatitis. ?2) Worsening liver enzymes. ?3) Cholelithiasis.  ? Cholecystitis. ?4) Liver lesions. ? ? The patient's liver enzymes are worsening.  Her steroids were stopped.  There is the question of an acute cholecystitis, which is a possibility given her abdominal pain and worsening liver enzymes.  A HIDA scan can help, but it can be hindered with the severe hepatitis.  A surgical evaluation for the possibility of an acute cholecystitis is appropriate.  As for the  findings on the CT scan, an MRI will be beneficial to discern if this is associated with cirrhosis versus malignant. ? ?Plan: ?1) HIDA. ?2) Consult Surgery. ?3) MRI of the liver. ? LOS: 7 days  ? ?Simone Tuckey D ?10/10/2021, 7:22 AM  ?

## 2021-10-11 ENCOUNTER — Inpatient Hospital Stay (HOSPITAL_COMMUNITY): Payer: Medicare PPO

## 2021-10-11 ENCOUNTER — Encounter (HOSPITAL_COMMUNITY): Payer: Self-pay | Admitting: Anesthesiology

## 2021-10-11 ENCOUNTER — Encounter (HOSPITAL_COMMUNITY): Admission: EM | Disposition: E | Payer: Self-pay | Source: Home / Self Care | Attending: Internal Medicine

## 2021-10-11 DIAGNOSIS — K7010 Alcoholic hepatitis without ascites: Secondary | ICD-10-CM | POA: Diagnosis not present

## 2021-10-11 DIAGNOSIS — K7201 Acute and subacute hepatic failure with coma: Secondary | ICD-10-CM | POA: Diagnosis not present

## 2021-10-11 DIAGNOSIS — D62 Acute posthemorrhagic anemia: Secondary | ICD-10-CM | POA: Diagnosis not present

## 2021-10-11 DIAGNOSIS — R1084 Generalized abdominal pain: Secondary | ICD-10-CM

## 2021-10-11 DIAGNOSIS — R109 Unspecified abdominal pain: Secondary | ICD-10-CM

## 2021-10-11 DIAGNOSIS — E872 Acidosis, unspecified: Secondary | ICD-10-CM | POA: Diagnosis not present

## 2021-10-11 DIAGNOSIS — K922 Gastrointestinal hemorrhage, unspecified: Secondary | ICD-10-CM

## 2021-10-11 DIAGNOSIS — R578 Other shock: Secondary | ICD-10-CM

## 2021-10-11 DIAGNOSIS — A419 Sepsis, unspecified organism: Secondary | ICD-10-CM

## 2021-10-11 DIAGNOSIS — K709 Alcoholic liver disease, unspecified: Secondary | ICD-10-CM | POA: Diagnosis not present

## 2021-10-11 HISTORY — PX: FLEXIBLE SIGMOIDOSCOPY: SHX5431

## 2021-10-11 LAB — CBC
HCT: 24.9 % — ABNORMAL LOW (ref 36.0–46.0)
HCT: 25.3 % — ABNORMAL LOW (ref 36.0–46.0)
HCT: 38.4 % (ref 36.0–46.0)
HCT: 21.2 % — ABNORMAL LOW (ref 36.0–46.0)
HCT: 21.1 % — ABNORMAL LOW (ref 36.0–46.0)
Hemoglobin: 8.6 g/dL — ABNORMAL LOW (ref 12.0–15.0)
Hemoglobin: 9 g/dL — ABNORMAL LOW (ref 12.0–15.0)
Hemoglobin: 12.3 g/dL (ref 12.0–15.0)
Hemoglobin: 7.8 g/dL — ABNORMAL LOW (ref 12.0–15.0)
Hemoglobin: 7.5 g/dL — ABNORMAL LOW (ref 12.0–15.0)
MCH: 31.5 pg (ref 26.0–34.0)
MCH: 32 pg (ref 26.0–34.0)
MCH: 28.9 pg (ref 26.0–34.0)
MCH: 32.5 pg (ref 26.0–34.0)
MCH: 31.9 pg (ref 26.0–34.0)
MCHC: 34.5 g/dL (ref 30.0–36.0)
MCHC: 35.6 g/dL (ref 30.0–36.0)
MCHC: 32 g/dL (ref 30.0–36.0)
MCHC: 36.8 g/dL — ABNORMAL HIGH (ref 30.0–36.0)
MCHC: 35.5 g/dL (ref 30.0–36.0)
MCV: 91.2 fL (ref 80.0–100.0)
MCV: 90 fL (ref 80.0–100.0)
MCV: 90.4 fL (ref 80.0–100.0)
MCV: 88.3 fL (ref 80.0–100.0)
MCV: 89.8 fL (ref 80.0–100.0)
Platelets: 123 10*3/uL — ABNORMAL LOW (ref 150–400)
Platelets: 143 10*3/uL — ABNORMAL LOW (ref 150–400)
Platelets: 267 10*3/uL (ref 150–400)
Platelets: 141 10*3/uL — ABNORMAL LOW (ref 150–400)
Platelets: 149 10*3/uL — ABNORMAL LOW (ref 150–400)
RBC: 2.73 MIL/uL — ABNORMAL LOW (ref 3.87–5.11)
RBC: 2.81 MIL/uL — ABNORMAL LOW (ref 3.87–5.11)
RBC: 4.25 MIL/uL (ref 3.87–5.11)
RBC: 2.4 MIL/uL — ABNORMAL LOW (ref 3.87–5.11)
RBC: 2.35 MIL/uL — ABNORMAL LOW (ref 3.87–5.11)
RDW: 19.9 % — ABNORMAL HIGH (ref 11.5–15.5)
RDW: 20.1 % — ABNORMAL HIGH (ref 11.5–15.5)
RDW: 14 % (ref 11.5–15.5)
RDW: 20.5 % — ABNORMAL HIGH (ref 11.5–15.5)
RDW: 21.3 % — ABNORMAL HIGH (ref 11.5–15.5)
WBC: 33.5 10*3/uL — ABNORMAL HIGH (ref 4.0–10.5)
WBC: 41.7 10*3/uL — ABNORMAL HIGH (ref 4.0–10.5)
WBC: 10.4 10*3/uL (ref 4.0–10.5)
WBC: 40.3 10*3/uL — ABNORMAL HIGH (ref 4.0–10.5)
WBC: 42.6 10*3/uL — ABNORMAL HIGH (ref 4.0–10.5)
nRBC: 6.3 % — ABNORMAL HIGH (ref 0.0–0.2)
nRBC: 8 % — ABNORMAL HIGH (ref 0.0–0.2)
nRBC: 0 % (ref 0.0–0.2)
nRBC: 8.8 % — ABNORMAL HIGH (ref 0.0–0.2)
nRBC: 8.2 % — ABNORMAL HIGH (ref 0.0–0.2)

## 2021-10-11 LAB — CBC WITH DIFFERENTIAL/PLATELET
Abs Immature Granulocytes: 2.2 10*3/uL — ABNORMAL HIGH (ref 0.00–0.07)
Basophils Absolute: 0 10*3/uL (ref 0.0–0.1)
Basophils Relative: 0 %
Eosinophils Absolute: 0 10*3/uL (ref 0.0–0.5)
Eosinophils Relative: 0 %
HCT: 26.5 % — ABNORMAL LOW (ref 36.0–46.0)
Hemoglobin: 9.3 g/dL — ABNORMAL LOW (ref 12.0–15.0)
Lymphocytes Relative: 12 %
Lymphs Abs: 3.8 10*3/uL (ref 0.7–4.0)
MCH: 33.7 pg (ref 26.0–34.0)
MCHC: 35.1 g/dL (ref 30.0–36.0)
MCV: 96 fL (ref 80.0–100.0)
Metamyelocytes Relative: 2 %
Monocytes Absolute: 2.9 10*3/uL — ABNORMAL HIGH (ref 0.1–1.0)
Monocytes Relative: 9 %
Myelocytes: 3 %
Neutro Abs: 23 10*3/uL — ABNORMAL HIGH (ref 1.7–7.7)
Neutrophils Relative %: 72 %
Platelets: 148 10*3/uL — ABNORMAL LOW (ref 150–400)
Promyelocytes Relative: 2 %
RBC: 2.76 MIL/uL — ABNORMAL LOW (ref 3.87–5.11)
RDW: 28.1 % — ABNORMAL HIGH (ref 11.5–15.5)
WBC: 31.9 10*3/uL — ABNORMAL HIGH (ref 4.0–10.5)
nRBC: 6.1 % — ABNORMAL HIGH (ref 0.0–0.2)
nRBC: 8 /100 WBC — ABNORMAL HIGH

## 2021-10-11 LAB — MAGNESIUM: Magnesium: 1.8 mg/dL (ref 1.7–2.4)

## 2021-10-11 LAB — COMPREHENSIVE METABOLIC PANEL
ALT: 59 U/L — ABNORMAL HIGH (ref 0–44)
AST: 241 U/L — ABNORMAL HIGH (ref 15–41)
Albumin: 2.3 g/dL — ABNORMAL LOW (ref 3.5–5.0)
Alkaline Phosphatase: 119 U/L (ref 38–126)
Anion gap: 15 (ref 5–15)
BUN: 28 mg/dL — ABNORMAL HIGH (ref 6–20)
CO2: 15 mmol/L — ABNORMAL LOW (ref 22–32)
Calcium: 6.2 mg/dL — CL (ref 8.9–10.3)
Chloride: 100 mmol/L (ref 98–111)
Creatinine, Ser: 2.33 mg/dL — ABNORMAL HIGH (ref 0.44–1.00)
GFR, Estimated: 24 mL/min — ABNORMAL LOW (ref >60)
Glucose, Bld: 107 mg/dL — ABNORMAL HIGH (ref 70–99)
Potassium: 3.3 mmol/L — ABNORMAL LOW (ref 3.5–5.1)
Sodium: 130 mmol/L — ABNORMAL LOW (ref 135–145)
Total Bilirubin: 24.7 mg/dL (ref 0.3–1.2)
Total Protein: 4.2 g/dL — ABNORMAL LOW (ref 6.5–8.1)

## 2021-10-11 LAB — POCT I-STAT 7, (LYTES, BLD GAS, ICA,H+H)
Acid-base deficit: 13 mmol/L — ABNORMAL HIGH (ref 0.0–2.0)
Bicarbonate: 11.1 mmol/L — ABNORMAL LOW (ref 20.0–28.0)
Calcium, Ion: 0.88 mmol/L — CL (ref 1.15–1.40)
HCT: 27 % — ABNORMAL LOW (ref 36.0–46.0)
Hemoglobin: 9.2 g/dL — ABNORMAL LOW (ref 12.0–15.0)
O2 Saturation: 97 %
Patient temperature: 97.6
Potassium: 3.4 mmol/L — ABNORMAL LOW (ref 3.5–5.1)
Sodium: 130 mmol/L — ABNORMAL LOW (ref 135–145)
TCO2: 12 mmol/L — ABNORMAL LOW (ref 22–32)
pCO2 arterial: 19.6 mmHg — CL (ref 32–48)
pH, Arterial: 7.359 (ref 7.35–7.45)
pO2, Arterial: 90 mmHg (ref 83–108)

## 2021-10-11 LAB — GLUCOSE, CAPILLARY
Glucose-Capillary: 100 mg/dL — ABNORMAL HIGH (ref 70–99)
Glucose-Capillary: 111 mg/dL — ABNORMAL HIGH (ref 70–99)
Glucose-Capillary: 119 mg/dL — ABNORMAL HIGH (ref 70–99)
Glucose-Capillary: 120 mg/dL — ABNORMAL HIGH (ref 70–99)
Glucose-Capillary: 147 mg/dL — ABNORMAL HIGH (ref 70–99)
Glucose-Capillary: 166 mg/dL — ABNORMAL HIGH (ref 70–99)
Glucose-Capillary: 49 mg/dL — ABNORMAL LOW (ref 70–99)
Glucose-Capillary: 76 mg/dL (ref 70–99)

## 2021-10-11 LAB — URINALYSIS, ROUTINE W REFLEX MICROSCOPIC
Glucose, UA: NEGATIVE mg/dL
Ketones, ur: NEGATIVE mg/dL
Leukocytes,Ua: NEGATIVE
Nitrite: NEGATIVE
Protein, ur: NEGATIVE mg/dL
Specific Gravity, Urine: 1.033 — ABNORMAL HIGH (ref 1.005–1.030)
pH: 5 (ref 5.0–8.0)

## 2021-10-11 LAB — PROTIME-INR
INR: 3.1 — ABNORMAL HIGH (ref 0.8–1.2)
INR: 2.1 — ABNORMAL HIGH (ref 0.8–1.2)
INR: 2.1 — ABNORMAL HIGH (ref 0.8–1.2)
Prothrombin Time: 31.9 seconds — ABNORMAL HIGH (ref 11.4–15.2)
Prothrombin Time: 23.8 seconds — ABNORMAL HIGH (ref 11.4–15.2)
Prothrombin Time: 23.4 seconds — ABNORMAL HIGH (ref 11.4–15.2)

## 2021-10-11 LAB — PHOSPHORUS: Phosphorus: 2.4 mg/dL — ABNORMAL LOW (ref 2.5–4.6)

## 2021-10-11 LAB — NA AND K (SODIUM & POTASSIUM), RAND UR
Potassium Urine: 71 mmol/L
Sodium, Ur: <10 mmol/L

## 2021-10-11 LAB — MRSA NEXT GEN BY PCR, NASAL: MRSA by PCR Next Gen: NOT DETECTED

## 2021-10-11 LAB — APTT: aPTT: 70 seconds — ABNORMAL HIGH (ref 24–36)

## 2021-10-11 LAB — CREATININE, URINE, RANDOM: Creatinine, Urine: 110.67 mg/dL

## 2021-10-11 LAB — PREPARE RBC (CROSSMATCH)

## 2021-10-11 LAB — LACTIC ACID, PLASMA: Lactic Acid, Venous: 4.3 mmol/L (ref 0.5–1.9)

## 2021-10-11 LAB — CHLORIDE, URINE, RANDOM: Chloride Urine: <15 mmol/L

## 2021-10-11 LAB — FIBRINOGEN: Fibrinogen: 118 mg/dL — ABNORMAL LOW (ref 210–475)

## 2021-10-11 IMAGING — CT CTA GI BLEED
2 of 18 series · 9 of 47 positions shown, 15 images · IV contrast (APPLIED)
Comparison: Recent Abdomen MRI [DATE], CT Abdomen [DATE]

CLINICAL DATA: 60-year-old female with blood and blood clots per
rectum. History of liver failure. Alcoholic hepatitis.

EXAM:
CTA ABDOMEN AND PELVIS WITHOUT AND WITH CONTRAST
TECHNIQUE: Multidetector CT imaging of the abdomen and pelvis was performed
using the standard protocol during bolus administration of
intravenous contrast. Multiplanar reconstructed images and MIPs were
obtained and reviewed to evaluate the vascular anatomy.

[Series 10: cor · coronal · 0.80mm/px · 1 of 166 slices shown, 2 images]
[im 83/166  soft-tissue]
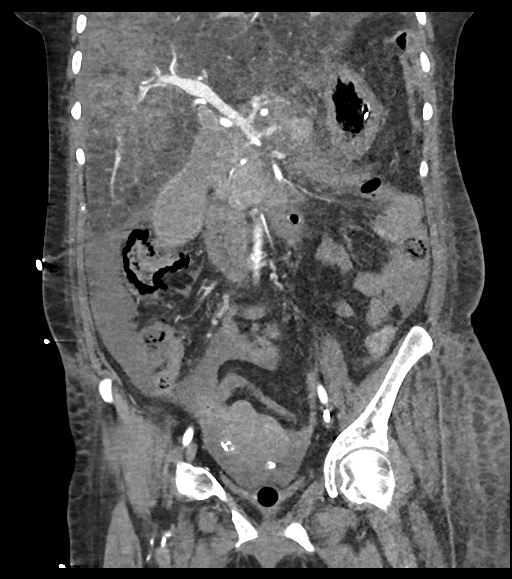
[im 83/166  bone]
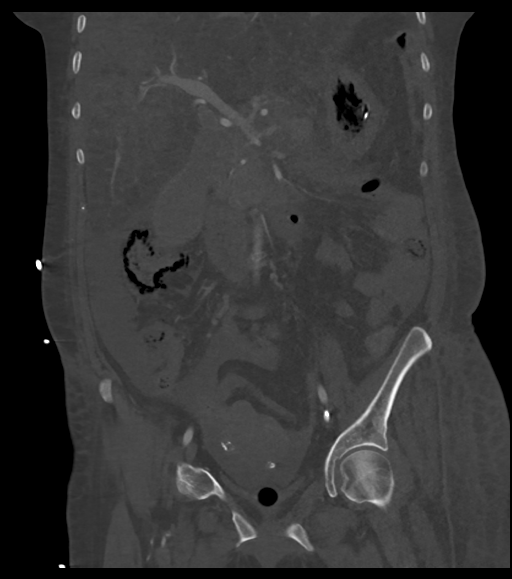

[Series 14: venous thins · axial · portal-venous · 0.71mm/px · z∈[+1002,+1356]mm · 8 of 1107 slices shown, 13 images]
[im 111/1107  soft-tissue]
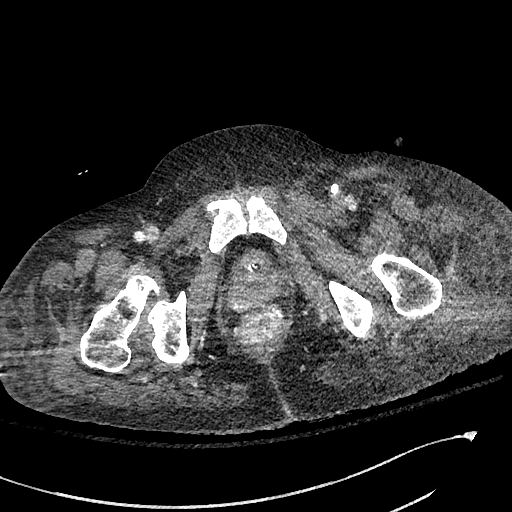
[im 111/1107  bone]
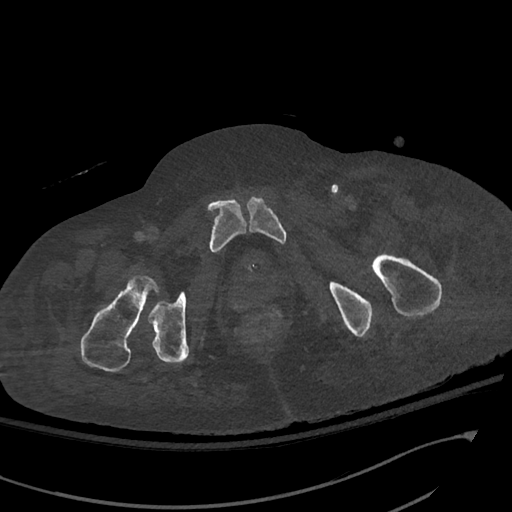
[im 222/1107  soft-tissue]
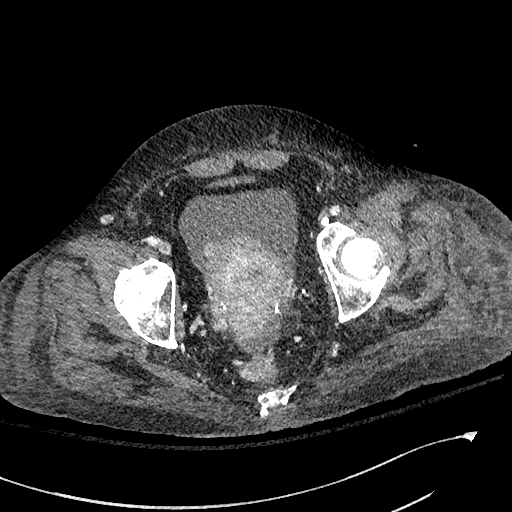
[im 332/1107  soft-tissue]
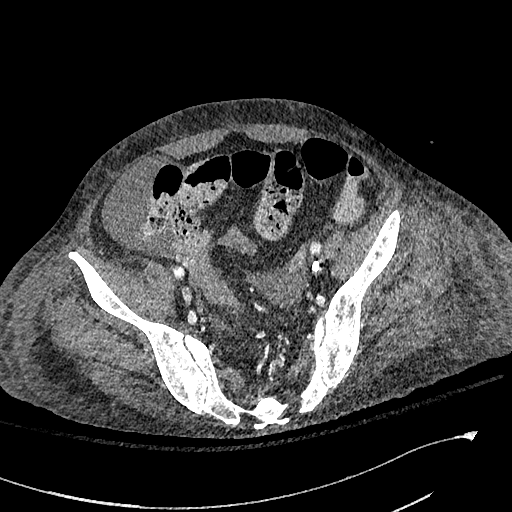
[im 443/1107  soft-tissue]
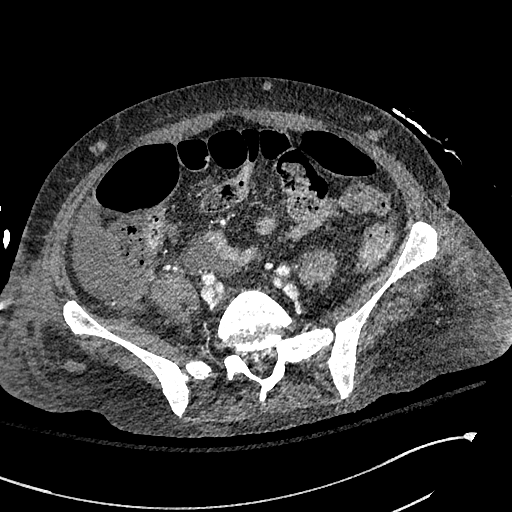
[im 664/1107  soft-tissue]
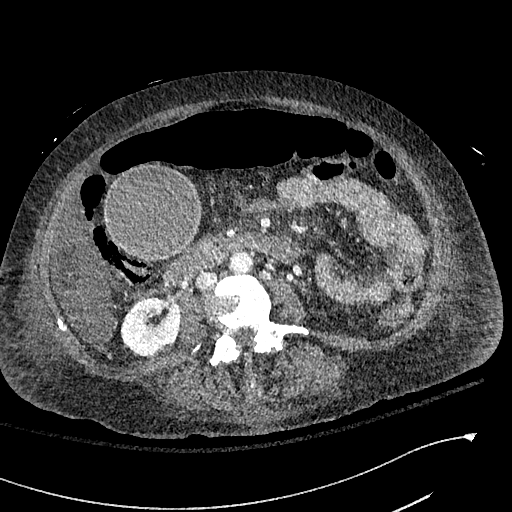
[im 664/1107  lung]
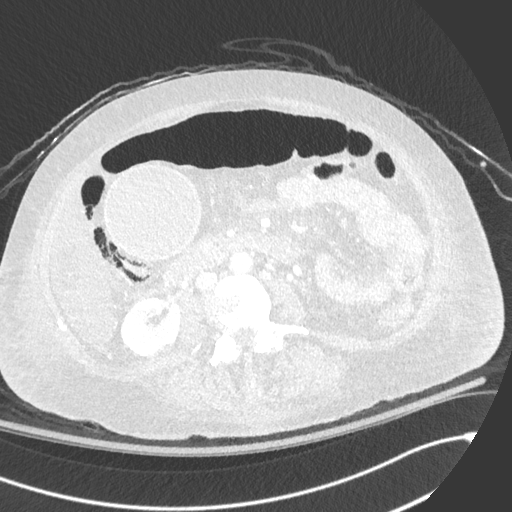
[im 775/1107  soft-tissue]
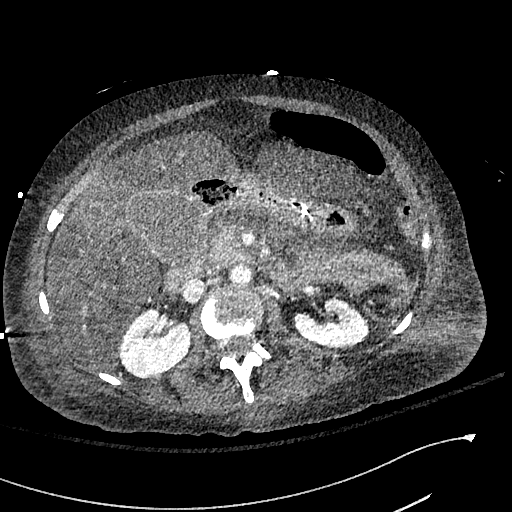
[im 775/1107  lung]
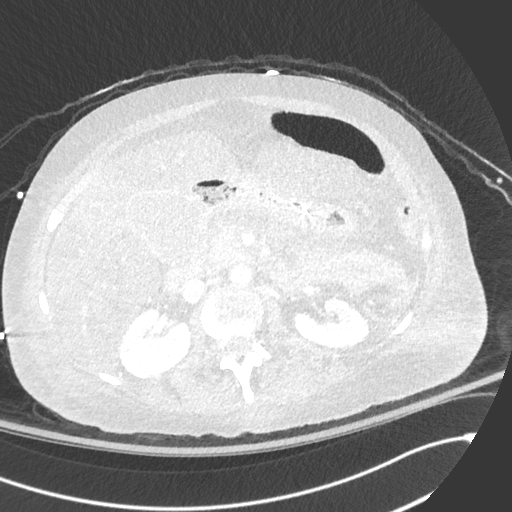
[im 885/1107  soft-tissue]
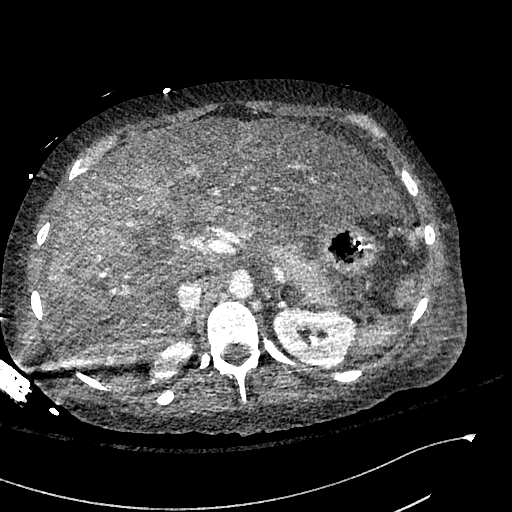
[im 885/1107  lung]
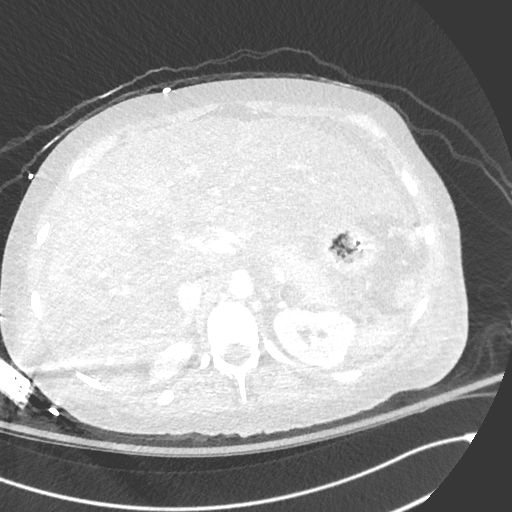
[im 996/1107  soft-tissue]
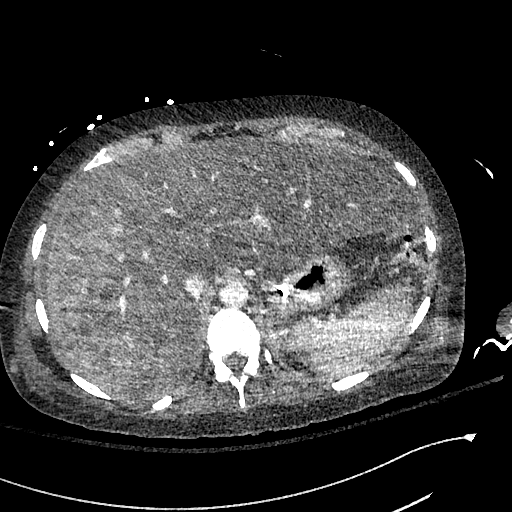
[im 996/1107  lung]
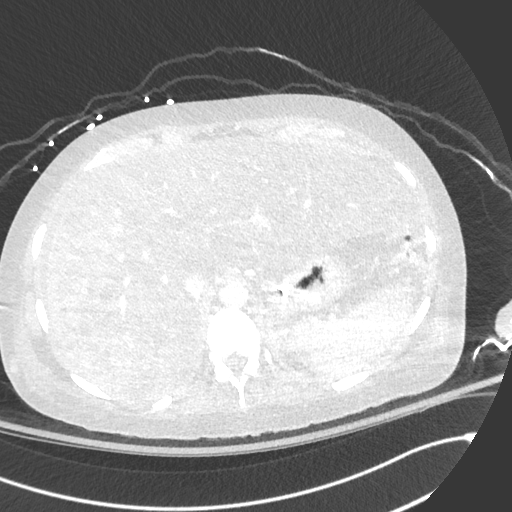

[9 of 47 positions shown; findings below may reference images not displayed]

RADIATION DOSE REDUCTION: This exam was performed according to the
departmental dose-optimization program which includes automated
exposure control, adjustment of the mA and/or kV according to
patient size and/or use of iterative reconstruction technique.

CONTRAST:  100mL OMNIPAQUE IOHEXOL 350 MG/ML SOLN
FINDINGS: VASCULAR

Normal abdominal aorta and its major branches. Minimal
atherosclerosis at the aortoiliac bifurcation. Patent bilateral
iliac arteries. No arterial aneurysm or dissection.

Left femoral approach venous catheter tip terminates at the
confluence of the left internal and external iliac veins.

Patent portal venous system with mass effect on the intrahepatic
portal veins and hepatic veins as demonstrated 2 days ago.

Normal caliber IVC.

Review of the MIP images confirms the above findings.

NON-VASCULAR

Lower chest: Stable small layering pleural effusions and lung base
atelectasis. No pericardial effusion.

Hepatobiliary: Severely abnormal liver, hepatomegaly, heterogeneous
steatosis and enhancement unchanged from the recent CT and MRI.
Massively distended gallbladder, volume estimated at 200 mL. Some
gallbladder vicarious contrast excretion suspected. No definite
pericholecystic fluid.

Pancreas: Indistinct pancreas with small volume ascites in the
lesser sac appears stable since [DATE]. Acute pancreatitis would
be difficult to exclude. No pancreatic ductal enlargement. No
pancreatic atrophy.

Spleen: No splenomegaly. Small volume perisplenic ascites.

Adrenals/Urinary Tract: Nonobstructed kidneys, but with retention of
contrast, ongoing contrast excretion on the initial noncontrast
phase images. No hydroureter. Bladder decompressed by Foley
catheter, with small volume of excreted IV contrast in the bladder.
Incidental pelvic phleboliths. Right renal benign angiomyolipoma
again noted.

Stomach/Bowel: Featureless large and small bowel loops throughout
the abdomen. Intermittent gas, retained stool, and also fluid in the
large bowel. Flocculated stool type material also in the distal
small bowel. Following IV contrast no extravasation into the bowel
lumen is identified on the early arterial or on the delayed images.
Enteric tube terminates in the distal stomach. No free air.

Lymphatic: No lymphadenopathy.

Reproductive: Multiple uterine fibroids, some subserosal and
pedunculated. Individual fibroids up to 2 cm. Some calcified.

Other: Streak artifact. But ascites in the lower abdomen and pelvis
now appears mildly complex, as opposed to simple fluid density 2
days ago. Volume of fluid is fairly small.

Musculoskeletal: No acute osseous abnormality identified. Anasarca.
IMPRESSION: VASCULAR

1. No contrast extravasation identified within the bowel lumen.
2. Normal abdominal and pelvic arteries with minimal
atherosclerosis.
3. Mass effect on the intrahepatic portal veins and hepatic veins
related to abnormal liver.

NON-VASCULAR

1. Anasarca, with stable small layering pleural effusions. Small
volume ascites which now has mildly complex fluid density - new from
two days ago. Consider peritonitis. Hemoperitoneum felt less likely.
2. Severe abnormal Liver, unchanged. Gallbladder Hydrops (200 mL
gallbladder).
3. Delayed bilateral nephrograms suggesting acute intrinsic renal
disease / acute renal insufficiency. No obstructive uropathy.
4. Indistinct pancreas stable since [DATE]. Difficult to exclude
Acute Pancreatitis.
5. Featureless large and small bowel loops with no bowel obstruction
or discrete bowel inflammation identified. No free air. Enteric tube
terminates in the distal stomach.

## 2021-10-11 IMAGING — NM NM GI BLOOD LOSS
2 series · 12 of 12 positions shown · non-contrast
Comparison: CTA from earlier in the same day.

CLINICAL DATA: GI hemorrhage for 1 day with negative CTA

EXAM:
NUCLEAR MEDICINE GASTROINTESTINAL BLEEDING SCAN
TECHNIQUE: Sequential abdominal images were obtained following intravenous
administration of [QN] labeled red blood cells.
RADIOPHARMACEUTICALS:  21.4 mCi [QN] pertechnetate in-vitro
labeled red cells.

[gi gi bleed · 4.52mm/px · 6 of 60 frames shown (1 of 2)]
[frame 6/60]
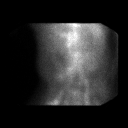
[frame 16/60]
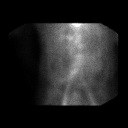
[frame 26/60]
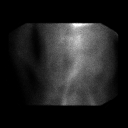
[frame 36/60]
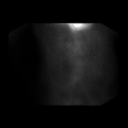
[frame 46/60]
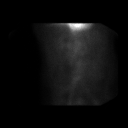
[frame 56/60]
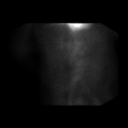

[gi gi bleed · 4.52mm/px · 6 of 60 frames shown (2 of 2)]
[frame 6/60]
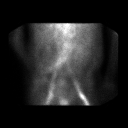
[frame 16/60]
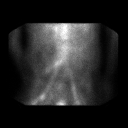
[frame 26/60]
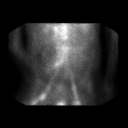
[frame 36/60]
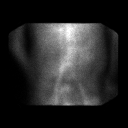
[frame 46/60]
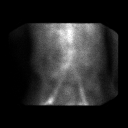
[frame 56/60]
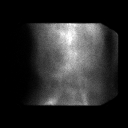

[12 of 12 positions shown; findings below may reference images not displayed]

FINDINGS: Injection is noted via a left femoral central line. Normal vascular
activity is identified. Minimal pooling is noted in the urinary
bladder. No focal area of increased activity with mobility is noted
to suggest acute GI hemorrhage.
IMPRESSION: No findings to suggest acute GI hemorrhage.

## 2021-10-11 IMAGING — DX DG CHEST 1V PORT
1 series · 2 of 2 positions shown · non-contrast
Comparison: [DATE]

CLINICAL DATA: Nasogastric catheter placement

EXAM:
PORTABLE CHEST 1 VIEW

[Series 1: chest · 0.14mm/px · 2 of 2 slices shown]
[im 1/2]
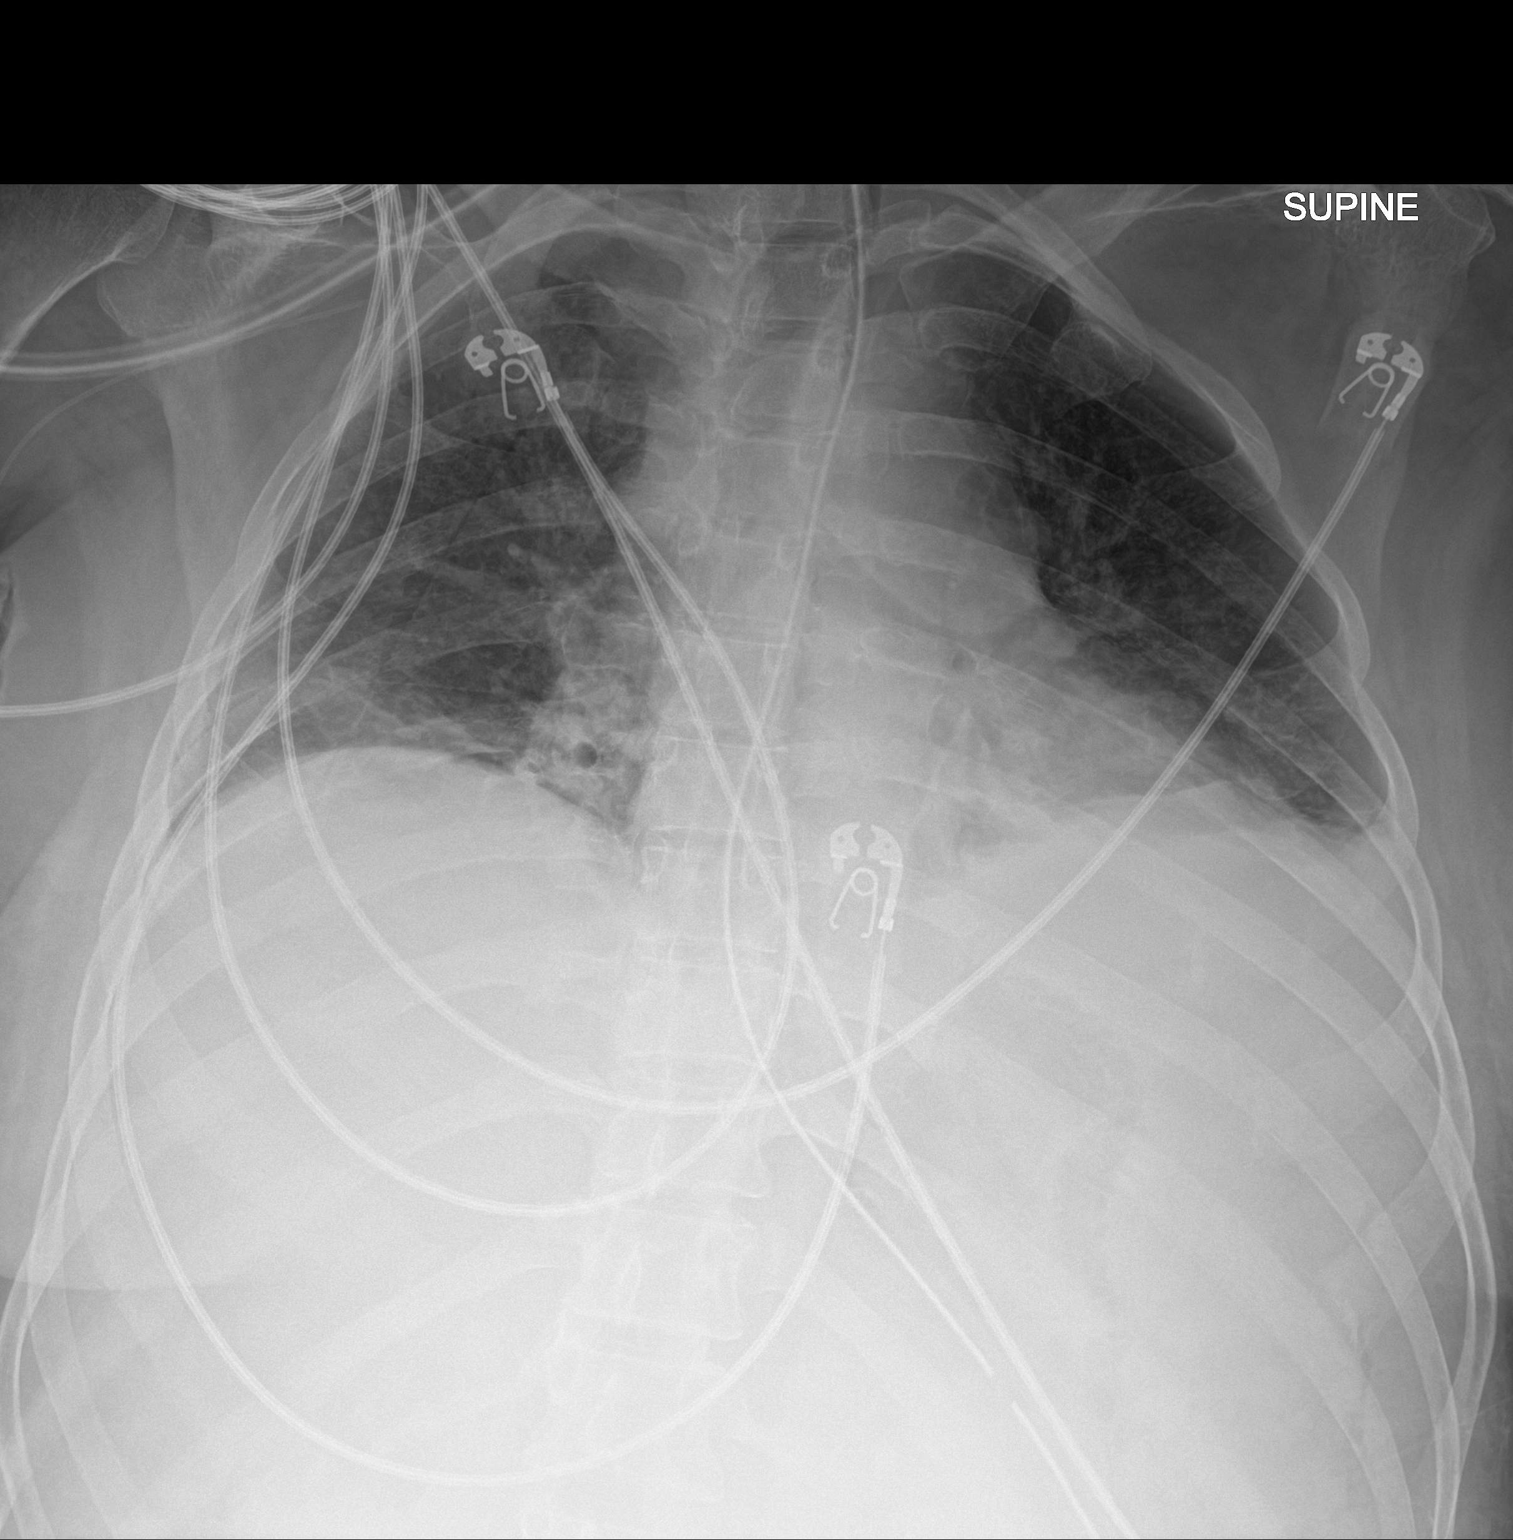
[im 2/2]
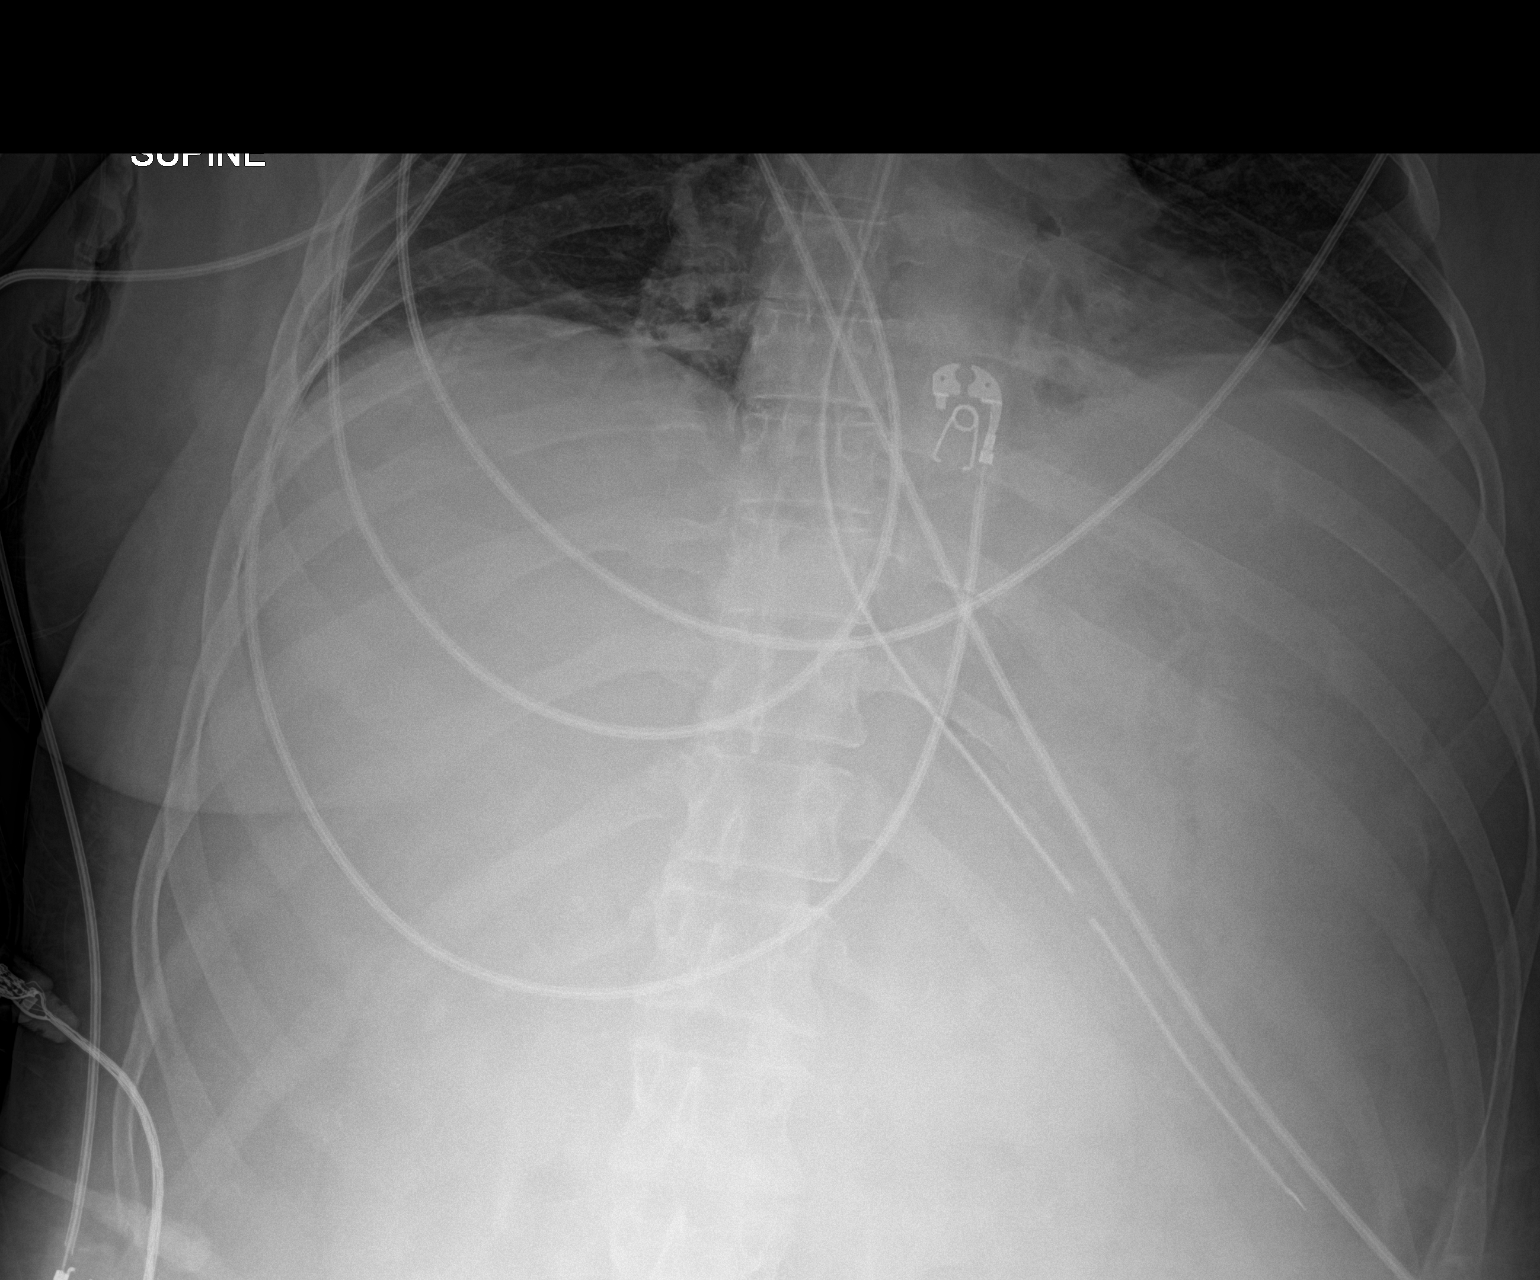

[2 of 2 positions shown; findings below may reference images not displayed]

FINDINGS: Cardiac shadow is stable. Gastric catheter is noted extending into
the stomach. Mild bibasilar atelectatic changes are noted new from
the prior exam. No acute bony abnormality is noted.
IMPRESSION: Gastric catheter within the stomach.

Bibasilar atelectasis is noted.

## 2021-10-11 SURGERY — SIGMOIDOSCOPY, FLEXIBLE
Anesthesia: Monitor Anesthesia Care

## 2021-10-11 MED ORDER — SODIUM PERTECHNETATE TC 99M INJECTION
21.4000 | Freq: Once | INTRAVENOUS | Status: AC | PRN
  Administered 2021-10-11: 21.4 via INTRAVENOUS

## 2021-10-11 MED ORDER — THIAMINE HCL 100 MG PO TABS
100.0000 mg | ORAL_TABLET | Freq: Every day | ORAL | Status: DC
  Administered 2021-10-12: 100 mg
  Filled 2021-10-11: qty 1

## 2021-10-11 MED ORDER — LACTULOSE 10 GM/15ML PO SOLN
20.0000 g | Freq: Three times a day (TID) | ORAL | Status: DC
  Administered 2021-10-11 – 2021-10-12 (×2): 20 g
  Filled 2021-10-11 (×2): qty 30

## 2021-10-11 MED ORDER — SODIUM CHLORIDE 0.9 % IV SOLN
50.0000 ug/h | INTRAVENOUS | Status: DC
  Administered 2021-10-11 – 2021-10-13 (×6): 50 ug/h via INTRAVENOUS
  Filled 2021-10-11 (×8): qty 1

## 2021-10-11 MED ORDER — NOREPINEPHRINE 16 MG/250ML-% IV SOLN
0.0000 ug/min | INTRAVENOUS | Status: DC
  Administered 2021-10-11: 5 ug/min via INTRAVENOUS
  Administered 2021-10-12: 19:00:00 26 ug/min via INTRAVENOUS
  Administered 2021-10-13: 21 ug/min via INTRAVENOUS
  Filled 2021-10-11 (×4): qty 250

## 2021-10-11 MED ORDER — MAGNESIUM SULFATE IN D5W 1-5 GM/100ML-% IV SOLN
1.0000 g | Freq: Once | INTRAVENOUS | Status: AC
  Administered 2021-10-11: 1 g via INTRAVENOUS
  Filled 2021-10-11: qty 100

## 2021-10-11 MED ORDER — SODIUM CHLORIDE 0.9% IV SOLUTION: Freq: Once | INTRAVENOUS | Status: AC

## 2021-10-11 MED ORDER — LEVETIRACETAM 100 MG/ML PO SOLN
500.0000 mg | Freq: Two times a day (BID) | ORAL | Status: DC
  Administered 2021-10-11 – 2021-10-12 (×3): 500 mg
  Filled 2021-10-11 (×3): qty 5

## 2021-10-11 MED ORDER — OCTREOTIDE LOAD VIA INFUSION
50.0000 ug | Freq: Once | INTRAVENOUS | Status: AC
  Administered 2021-10-11: 50 ug via INTRAVENOUS
  Filled 2021-10-11: qty 25

## 2021-10-11 MED ORDER — CALCIUM GLUCONATE-NACL 2-0.675 GM/100ML-% IV SOLN
2.0000 g | Freq: Once | INTRAVENOUS | Status: AC
  Administered 2021-10-11: 2000 mg via INTRAVENOUS
  Filled 2021-10-11: qty 100

## 2021-10-11 MED ORDER — DEXTROSE 5 % IV SOLN: INTRAVENOUS | Status: DC

## 2021-10-11 MED ORDER — DEXTROSE 50 % IV SOLN
INTRAVENOUS | Status: AC
Start: 2021-10-11 — End: 2021-10-11
  Filled 2021-10-11: qty 50

## 2021-10-11 MED ORDER — PIPERACILLIN-TAZOBACTAM 3.375 G IVPB
3.3750 g | Freq: Three times a day (TID) | INTRAVENOUS | Status: DC
  Administered 2021-10-11 – 2021-10-13 (×7): 3.375 g via INTRAVENOUS
  Filled 2021-10-11 (×7): qty 50

## 2021-10-11 MED ORDER — HYDROXYZINE HCL 25 MG PO TABS: 25.0000 mg | ORAL_TABLET | Freq: Two times a day (BID) | ORAL | Status: DC | PRN

## 2021-10-11 MED ORDER — IOHEXOL 350 MG/ML SOLN
100.0000 mL | Freq: Once | INTRAVENOUS | Status: AC | PRN
  Administered 2021-10-11: 100 mL via INTRAVENOUS

## 2021-10-11 MED ORDER — PANTOPRAZOLE SODIUM 40 MG IV SOLR
40.0000 mg | Freq: Two times a day (BID) | INTRAVENOUS | Status: DC
  Administered 2021-10-11 – 2021-10-13 (×5): 40 mg via INTRAVENOUS
  Filled 2021-10-11 (×6): qty 10

## 2021-10-11 MED ORDER — POTASSIUM CHLORIDE 10 MEQ/50ML IV SOLN
10.0000 meq | INTRAVENOUS | Status: AC
  Administered 2021-10-11: 10 meq via INTRAVENOUS
  Filled 2021-10-11 (×2): qty 50

## 2021-10-11 MED ORDER — SODIUM BICARBONATE 8.4 % IV SOLN
INTRAVENOUS | Status: AC
  Filled 2021-10-11: qty 1000

## 2021-10-11 MED ORDER — DEXTROSE 50 % IV SOLN
25.0000 g | INTRAVENOUS | Status: AC
  Administered 2021-10-11: 25 g via INTRAVENOUS

## 2021-10-11 MED ORDER — POTASSIUM CHLORIDE 10 MEQ/50ML IV SOLN
10.0000 meq | Freq: Once | INTRAVENOUS | Status: AC
  Administered 2021-10-11: 10 meq via INTRAVENOUS
  Filled 2021-10-11: qty 50

## 2021-10-11 MED ORDER — SODIUM CHLORIDE 0.9 % IV SOLN: INTRAVENOUS | Status: DC

## 2021-10-11 MED ORDER — POTASSIUM PHOSPHATES 15 MMOLE/5ML IV SOLN
30.0000 mmol | Freq: Once | INTRAVENOUS | Status: AC
  Administered 2021-10-11: 30 mmol via INTRAVENOUS
  Filled 2021-10-11: qty 10

## 2021-10-11 MED ORDER — RIFAXIMIN 550 MG PO TABS
550.0000 mg | ORAL_TABLET | Freq: Two times a day (BID) | ORAL | Status: DC
  Administered 2021-10-11 – 2021-10-12 (×3): 550 mg
  Filled 2021-10-11 (×3): qty 1

## 2021-10-11 MED ORDER — LEVOTHYROXINE SODIUM 75 MCG PO TABS: 75.0000 ug | ORAL_TABLET | Freq: Every day | ORAL | Status: DC

## 2021-10-11 MED ORDER — FOLIC ACID 1 MG PO TABS
1.0000 mg | ORAL_TABLET | Freq: Every day | ORAL | Status: DC
Start: 2021-10-12 — End: 2021-10-13
  Administered 2021-10-12: 1 mg
  Filled 2021-10-11: qty 1

## 2021-10-11 MED ORDER — CHLORHEXIDINE GLUCONATE CLOTH 2 % EX PADS
6.0000 | MEDICATED_PAD | Freq: Every day | CUTANEOUS | Status: DC
  Administered 2021-10-11 – 2021-10-13 (×3): 6 via TOPICAL

## 2021-10-11 MED ORDER — ADULT MULTIVITAMIN W/MINERALS CH
1.0000 | ORAL_TABLET | Freq: Every day | ORAL | Status: DC
  Administered 2021-10-12: 1
  Filled 2021-10-11: qty 1

## 2021-10-11 MED ORDER — SODIUM BICARBONATE 650 MG PO TABS
1300.0000 mg | ORAL_TABLET | Freq: Two times a day (BID) | ORAL | Status: DC
  Administered 2021-10-11 – 2021-10-12 (×3): 1300 mg
  Filled 2021-10-11 (×3): qty 2

## 2021-10-11 MED ORDER — DEXTROSE 50 % IV SOLN
INTRAVENOUS | Status: AC
  Filled 2021-10-11: qty 50

## 2021-10-11 MED ORDER — SODIUM CHLORIDE 0.9 % IV BOLUS
500.0000 mL | Freq: Once | INTRAVENOUS | Status: AC
  Administered 2021-10-11: 500 mL via INTRAVENOUS

## 2021-10-11 NOTE — Consult Note (Signed)
? ?                                                                                ?Consultation Note ?Date: 10/08/2021  ? ?Patient Name: Judy Meyer  ?DOB: [birth year]  MRN: [id number]  Age / Sex: 61 y.o., female  ?PCP: Pcp, No ?Referring Physician: Jacky Kindle, MD ? ?Reason for Consultation: Establishing goals of care ? ?HPI/Patient Profile: 61 y.o. female  with past medical history of Etoh abuse, HTN, hypothyroidism, liver disease, and history of seizures admitted on 10/02/2021 with acute liver failure d/t alcoholic hepatitis and acute cholecystitis.  ? ?3/4, pt had red blood from rectum with hypotension. CCM and GI were consulted and pt transferred to ICU. Pt has received total 3 unit PRBCs.  ? ?Clinical Assessment and Goals of Care: ?I have reviewed medical records including EPIC notes, labs and imaging, assessed the patient and then met with patient and her significant other [name] at bedside  to discuss diagnosis prognosis, GOC, EOL wishes, disposition and options. Pt is lethargic and minimally responsive to questions with yes/no response. Majority of history is obtained from [place].  ? ?I introduced Palliative Medicine as specialized medical care for people living with serious illness. It focuses on providing relief from the symptoms and stress of a serious illness. The goal is to improve quality of life for both the patient and the family. ? ?We discussed a brief life review of the patient. [name] and patient have been together since 1997. 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  ? ?[name] shares that the patient does not have any children. Her mother is still living but is estranged. [name] has contacted [name] and is considering when he will try to contact her mother to notify her of [name] and hospitalization.  ? ?As far as functional and nutritional status [name] says she was eating regular meals and having  no difficulty with performing ADLs independently. He says she would have significant abdominal pain that she sometimes would ease by drinking alcohol. She also suffers from migraines and took Tylenol PRN, which he also believes has contributed to her liver disease.  ? ?We discussed patient's current illness and what it means in the larger context of patient's on-going co-morbidities. I attempted to elicit values and goals of care important to the patient. [name] is hopeful she will pull through this and be agreeable to see a liver specialist at [facility]. He says she has bounced back from jaundice and liver injury before but recognizes that this time is different and seems more serious. He is appropriately tearful when discussing goals of care. Therapeutic silence and active listening provided for [name]. His mother passed from biliary duct cancer and appeared jaundiced with similar problems as [name]. Emotional support provided.  ? ?Education offered regarding concept specific to human mortality and the limitations of medical interventions to prolong life when the liver is in failure. As a pharmacist and being involved with the patient's care for so long, [name] is realistic and understands the gravity of her diagnosis and prognosis.  ?  ?Discussed with  [name] the importance of continued conversation with family and the medical providers regarding overall plan of care and treatment options, ensuring decisions are within the context of the patient?s values and GOCs.   ? ?Spiritual care visit offered and accepted. Spirital care referral placed.  ? ?Questions and concerns were addressed. The family was encouraged to call with questions or concerns.  ? ?Primary Decision Maker ?OTHER - longtime boyfriend [name] ? ?Code Status/Advance Care Planning: ?Full code ? ?Prognosis:   ?Unable to determine ? ?Discharge Planning: To Be Determined ? ?Primary Diagnoses: ?Present on Admission: ? Leukocytosis ? Hyperbilirubinemia ? Hypokalemia ?  Acidosis, metabolic ? ? ?Physical Exam ?Vitals and nursing note reviewed.  ?Constitutional:   ?   General: She is not in acute distress. ?HENT:  ?   Head: Normocephalic and atraumatic.  ?Cardiovascular:  ?   Rate and Rhythm: Normal rate.  ?Pulmonary:  ?   Effort: Pulmonary effort is normal.  ?Abdominal:  ?   General: There is distension.  ?   Palpations: There is hepatomegaly.  ?Skin: ?   Coloration: Skin is jaundiced.  ?Psychiatric:     ?   Mood and Affect: Mood is not anxious.     ?   Behavior: Behavior normal.  ? ? ?Palliative Assessment/Data: 30% ? ? ? ? ?I discussed this patient's plan of care with patient, patient's BF [name]. ? ?Thank you for this consult. Palliative medicine will continue to follow and assist holistically.  ? ?Time Total: 75 minutes ?Greater than 50%  of this time was spent counseling and coordinating care related to the above assessment and plan. ? ?Signed by: ?Judy Hawks, DNP, FNP-BC ?Palliative Medicine ? ?  ?Please contact Palliative Medicine Team phone at [phone] for questions and concerns.  ?For individual provider: See Amion ? ? ? ? ? ? ? ? ? ? ? ? ?  ?

## 2021-10-11 NOTE — Code Documentation (Signed)
Asked by PCCM to expedite tx of pt to ICU. Pt transferred with 5W RN, 5W NT, and myself to 3M12.  ?

## 2021-10-11 NOTE — Progress Notes (Signed)
?   10/10/21 2108  ?Notify: Provider  ?Provider Name/Title Braswell MD  ?Date Provider Notified 10/10/21  ?Time Provider Notified 2108  ?Notification Type  ?(secured text)  ?Notification Reason Other (Comment) ?(Pain meds)  ?Provider response See new orders ?(Will page GI and order PRBC)  ?Date of Provider Response 10/21/2021  ? ? ?

## 2021-10-11 NOTE — Progress Notes (Addendum)
? ?  PCCM Interval Progress Note ? ?S:  ?Called to [name] A Kohen's room by bedside nurse groin hematoma from left femoral central venous line. ? ?O: ?Blood pressure (!) 108/50, pulse 89, temperature 98.6 ?F (37 ?C), resp. rate 16, height '5\' 6"'$  (1.676 m), weight 78.1 kg, last menstrual period 06/11/2018, SpO2 100 %. ?   ? ? ?Intake/Output Summary (Last 24 hours) at 10/31/2021 2037 ?Last data filed at 11/01/2021 1600 ?Gross per 24 hour  ?Intake 3497.66 ml  ?Output 120 ml  ?Net 3377.66 ml  ? ?Filed Weights  ? 10/04/21 9798 10/16/2021 0255  ?Weight: 63.7 kg 78.1 kg  ? ? ?Exam: ? ? ?Left leg warm, distal pulses intact, decreasing Levophed requirement.  Sensation intact. ? ?All 3 CVC ports checked with brisk blood return ? ?Labs:  ?Hemoglobin: 9 > 12.3> 7.8 ?Platelets: 267> 141 ? ?Imaging: ?CTA angio GI bleed: Confirmed left femoral central venous catheter tip terminates at the confluence of the left internal and external iliac veins. adequate placement ? ?A: ?P: ?Left groin hematoma, likely secondary to central venous catheter placement in the setting of acute liver failure ?-Continue central line.  Monitor for blood return in ports per protocol ?-RN marked groin hematoma.  RN instructed to continue monitoring site and mark progress. ?-RN instructed to place sandbag to site and keep patient leg straight as able. RN to monitor leg perfusion. ?-Repeat CBC and INR. Elink to follow-up and transfuse per best practice. ? ? ?Plan above discussed with RN at bedside and Dr. Prudencio Burly with [facility] via secure chat ? ? ?[employer] MSN, APRN, AGACNP-BC ?[facility]  ?10/16/2021 , 8:37 PM ? ?Please see [email or web address] for pager details ? ?If no response, please call [phone] ?After hours, please call Elink at [phone] ? ? ? ? ? ? ? ? ? ? ?

## 2021-10-11 NOTE — Procedures (Signed)
Arterial Catheter Insertion Procedure Note ? ?Judy Meyer  ?[id number]  ?16-Mar-1961 ? ?Date:10/16/2021  ?Time:5:01 PM  ? ? ?Provider Performing: Jacky Kindle  ? ? ?Procedure: Insertion of Arterial Line [id number]) with US guidance (65465)  ? ?Indication(s) ?Blood pressure monitoring and/or need for frequent ABGs ? ?Consent ?Risks of the procedure as well as the alternatives and risks of each were explained to the patient and/or caregiver.  Consent for the procedure was obtained and is signed in the bedside chart ? ?Anesthesia ?None ? ? ?Time Out ?Verified patient identification, verified procedure, site/side was marked, verified correct patient position, special equipment/implants available, medications/allergies/relevant history reviewed, required imaging and test results available. ? ? ?Sterile Technique ?Maximal sterile technique including full sterile barrier drape, hand hygiene, sterile gown, sterile gloves, mask, hair covering, sterile ultrasound probe cover (if used). ? ? ?Procedure Description ?Area of catheter insertion was cleaned with chlorhexidine and draped in sterile fashion. With real-time ultrasound guidance an arterial catheter was placed into the right  Axillary  artery.  Appropriate arterial tracings confirmed on monitor.   ? ? ?Complications/Tolerance ?None; patient tolerated the procedure well. ? ? ?EBL ?Minimal ? ? ?Specimen(s) ?None ? ?

## 2021-10-11 NOTE — Progress Notes (Signed)
NAME:  Judy Meyer, MRN:  [id number], DOB:  Mar 26, 1961, LOS: 8 ADMISSION DATE:  09/16/2021, CONSULTATION DATE:  10/23/2021 REFERRING MD:  Gilles Chiquito, MD, CHIEF COMPLAINT:  BRBPR  History of Present Illness:  Patient is a 61 year old lady was admitted on 09/29/2021 with jaundice.  Over the course of stay the tentative diagnosis of alcoholic hepatitis was made, patient was on prednisone, that did not improve her overall clinical picture.  It was also found out that patient is having acute cholecystitis, general surgery, gastroenterology and IR were consulted.  Eventually patient was scheduled for IR guided cholecystostomy tube on 10/18/2021.  On the night between 10/10/2021 and 10/26/2021 patient started having bright red blood per rectum and she started dropping her blood pressure.  Critical care was consulted for admission to the ICU.  Patient was transfused 1 unit of packed red blood cells and GI was consulted before transferring the patient to the ICU.  Primary team has trended hemoglobin, her initial hemoglobin on 10/10/2021 was around 6.1, patient was given 1 unit of PRBC and her hemoglobin improved to 9.3.  Overnight her blood pressure dropped and due to multiple bowel movements she was given another unit of packed red blood cell.    Pertinent  Medical History  Alcohol abuse, essential hypertension, hypothyroidism, liver disease being followed at [facility], history of seizures.  Significant Hospital Events: Including procedures, antibiotic start and stop dates in addition to other pertinent events   09/10/2021: Admitted with jaundice evaded liver enzymes, working diagnosis of acute liver failure due to alcoholic hepatitis started on prednisone.  On Zosyn since 10/04/2021.  Started on lactulose and rifaximin for hepatic encephalopathy 10/04/2021: GI and general surgery have evaluated the patient for acute cholecystitis. 09/15/2021: Critical care was consulted due to multiple metabolic  derangements. 10/08/2021: Worsening abdominal distention, repeat CT scan of the abdomen and pelvis performed 10/09/2021: Prednisone was stopped due to minimal improvement in [facility] 0.981 on day 7 10/10/2021: Started having lower GI bleed with worsening hypotension.  Albumin given.  ICU was consulted overnight  Interim History / Subjective:  On my encounter patient is awake and alert, following commands and moving all extremities, she is complaining of abdominal pain that is not more than the usual.  She denies any other significant problems.  She had multiple episodes of bright red blood per rectum.  Patient denies any nausea, vomiting, chest pain, palpitations, cough, worsening shortness of breath, headaches, vision changes, tingling or numbness or burning in the extremities.  Objective   Blood pressure (!) 85/42, pulse 71, temperature 97.6 F (36.4 C), temperature source Axillary, resp. rate 16, height '5\' 6"'$  (1.676 m), weight 78.1 kg, last menstrual period 06/11/2018, SpO2 99 %.        Intake/Output Summary (Last 24 hours) at 10/31/2021 0412 Last data filed at 10/21/2021 0328 Gross per 24 hour  Intake 1251.46 ml  Output 600 ml  Net 651.46 ml   Filed Weights   10/04/21 0337 11/05/2021 0255  Weight: 63.7 kg 78.1 kg    Examination: General: Awake, alert and jaundiced HENT: Atraumatic, pupils equal and reactive Lungs: Bilateral clear breath sounds Cardiovascular: S1 and S2 Abdomen: Distended, tender abdomen.  Minimal bowel sounds Extremities: No significant edema Neuro: Moving all extremities, no obvious cranial nerve deficit.  Sensation intact.  Bedside ultrasound: Normal LV systolic function and RV systolic function.  Normal LV and RV chamber sizes.  No evidence of pericardial effusion.  TAPSE is around 2 cm.  No obvious wall  motion abnormalities noted.  EPPS <1cm.  Small pleural effusion on the left side with atelectasis.  Trace effusion on the right side.  There are a-lines present on  abdominal ultrasound, could be abdominal gas versus perforated viscus. Resolved Hospital Problem list     Assessment & Plan:  #1: Hypovolemic shock, hemorrhagic shock, acute blood loss anemia due to GI bleed, likely lower GI bleed.  There might be component of septic shock from likely GI source, acute cholecystitis -Obtain stat CTA abdomen and pelvis, trend CBC every 6 hours, check PT/INR, fibrinogen, blood cultures, UA, lactate -Transfuse to maintain hemoglobin above 7, transfusing 2 units, 3 units on hold.  Patient has received total of 3 units in the last 24 hours -GI has already been called by internal medicine team -Place NG-low intermittent suction, start Protonix 40 mg twice a day, octreotide bolus and drip (might not need once we have more data), continue Zosyn for now  #2: Acute liver failure, likely alcoholic hepatitis, worsening.  No response to 7 days of steroids Acute cholecystitis, scheduled to have cholecystostomy tube placement tomorrow Likely hepatic encephalopathy Active alcohol abuse, alcohol use disorder Coagulopathy related to acute liver failure -Continue Zosyn, lactulose and rifaximin -GI, General surgery and IR are following -Trending liver function -? transfer to transplant center, known to [facility]  #3: Acute kidney injury, unclear etiology likely ATN.  HRS is also in the differential -Check UA and urine chemistries -Trend renal function, avoid nephrotoxic agents  #4: Acute encephalopathy, metabolic encephalopathy -Continue lactulose and rifaximin  #5: Abdominal distention, unclear etiology -Follow-up on CT abdomen and pelvis  #6: Hypoglycemia in the setting of shock and acute liver injury -D5W at 75  #7: Hypothyroidism -Continue home medication  #8: History of seizure disorder -Continue home medication   Best Practice (right click and "Reselect all SmartList Selections" daily)   Diet/type: NPO DVT prophylaxis: SCD GI prophylaxis: PPI Lines:  Central line Foley:  Yes, and it is still needed Code Status:  full code Last date of multidisciplinary goals of care discussion '[]'$   Labs   CBC: Recent Labs  Lab 10/08/21 0646 10/09/21 0131 10/10/21 0824 10/10/21 1813 10/10/21 2358  WBC 30.8* 32.7* 35.0* 33.6* 31.9*  NEUTROABS  --   --   --   --  23.0*  HGB 8.9* 8.7* 6.9* 8.1*   8.0* 9.3*  HCT 26.3* 26.0* 20.7* 23.3*   23.5* 26.5*  MCV 104.0* 104.8* 107.3* 100.0 96.0  PLT 217 245 192 180 148*    Basic Metabolic Panel: Recent Labs  Lab 10/04/21 1804 10/04/21 2008 10/05/21 1137 10/05/21 1523 10/06/21 0519 10/07/21 0254 10/07/21 1427 10/08/21 0646 10/09/21 0131 10/10/21 0824  NA 131*   < > 136   < > 135 135 134* 138 132* 131*  K 2.9*   < > 3.7   < > 3.5 2.7* 3.4* 3.1* 3.2* 3.8  CL 100   < > 101   < > 100 102 104 104 103 100  CO2 16*   < > 21*   < > 24 21* 17* 20* 16* 15*  GLUCOSE 132*   < > 111*   < > 103* 98 118* 87 98 68*  BUN 10   < > 10   < > '9 12 12 13 18 '$ 24*  CREATININE 1.12*   < > 1.14*   < > 1.06* 1.15* 1.32* 1.33* 1.56* 1.85*  CALCIUM 7.9*   < > 8.2*   < > 8.2* 7.9* 7.6* 7.5*  7.2* 7.3*  MG 1.8  --  1.8  --  2.1  --   --   --   --   --    < > = values in this interval not displayed.   GFR: Estimated Creatinine Clearance: 34.5 mL/min (A) (by C-G formula based on SCr of 1.85 mg/dL (H)). Recent Labs  Lab 10/06/21 0058 10/06/21 0519 10/06/21 0823 10/06/21 1121 10/08/21 0646 10/09/21 0131 10/10/21 0824 10/10/21 1813 10/10/21 2358  WBC  --    < >  --   --  30.8* 32.7* 35.0* 33.6* 31.9*  LATICACIDVEN 4.2*  --  2.6* 2.6* 2.4*  --   --   --   --    < > = values in this interval not displayed.    Liver Function Tests: Recent Labs  Lab 10/07/21 0254 10/07/21 1427 10/08/21 0646 10/09/21 0131 10/10/21 0824  AST 359* 370* 387* 406* 355*  ALT 99* 100* 107* 110* 92*  ALKPHOS 134* 145* 152* 206* 189*  BILITOT 34.8* 32.7* 33.8* 34.1* 37.7*  PROT 5.7* 6.0* 5.7* 6.0* 5.7*  ALBUMIN 2.1* 2.1* 1.9* 2.0* 2.7*    No results for input(s): LIPASE, AMYLASE in the last 168 hours. No results for input(s): AMMONIA in the last 168 hours.  ABG    Component Value Date/Time   HCO3 21.6 10/05/2021 0111   TCO2 14 (L) 03/16/2021 1007   ACIDBASEDEF 5.0 (H) 10/05/2021 0111   O2SAT 32.4 10/05/2021 0111     Coagulation Profile: Recent Labs  Lab 10/05/21 0111 10/07/21 0254 10/08/21 0646 10/09/21 0131 10/10/21 0824  INR 1.9* 2.0* 2.5* 2.5* 2.7*    Cardiac Enzymes: No results for input(s): CKTOTAL, CKMB, CKMBINDEX, TROPONINI in the last 168 hours.  HbA1C: Hgb A1c MFr Bld  Date/Time Value Ref Range Status  07/18/2020 01:27 PM 5.1 4.8 - 5.6 % Final    Comment:    (NOTE) Pre diabetes:          5.7%-6.4%  Diabetes:              >6.4%  Glycemic control for   <7.0% adults with diabetes     CBG: Recent Labs  Lab 10/10/21 1151 10/10/21 1602 10/09/2021 0004 10/23/2021 0037 10/23/2021 0338  GLUCAP 73 74 49* 166* 76    Review of Systems:   10 point review of system was performed, everything is negative except mentioned in HPI.  Past Medical History:  She,  has a past medical history of Hypertension, Hypothyroidism, Liver disease, and Uterine fibroid.   Surgical History:   Past Surgical History:  Procedure Laterality Date   hand fracture surgery Right    LIVER BIOPSY       Social History:   reports that she has never smoked. She has never used smokeless tobacco. She reports current alcohol use. She reports that she does not use drugs.   Family History:  Her family history includes High Cholesterol in her mother; Hypertension in her mother and another family member.   Allergies Allergies  Allergen Reactions   Compazine [Prochlorperazine Edisylate] Anaphylaxis   Haloperidol Other (See Comments)    Other reaction(s): Seizure (finding) Other reaction(s): Seizure (finding)    Periactin [Cyproheptadine] Anaphylaxis   Cyclobenzaprine Other (See Comments)    Palpations    Haloperidol  And Related Other (See Comments)    Caused seizure   Metoclopramide Other (See Comments)    Caused seizure     Home Medications  Prior to Admission medications   Medication  Sig Start Date End Date Taking? Authorizing Provider  omeprazole (PRILOSEC) 20 MG capsule Take 20 mg by mouth daily.   Yes [provider]  albuterol (VENTOLIN HFA) 108 (90 Base) MCG/ACT inhaler Inhale 2 puffs into the lungs 4 (four) times daily as needed for wheezing or shortness of breath. Patient not taking: Reported on 09/11/2021 12/20/19   [provider]  amLODipine (NORVASC) 10 MG tablet Take 1 tablet (10 mg total) by mouth daily. Patient not taking: Reported on 09/13/2021 11/21/20   Mercy Riding, MD  levETIRAcetam (KEPPRA) 500 MG tablet Take 1 tablet (500 mg total) by mouth 2 (two) times daily. Patient not taking: Reported on 09/12/2021 03/20/21   Hosie Poisson, MD  levothyroxine (SYNTHROID) 75 MCG tablet Take 1 tablet (75 mcg total) by mouth daily at 6 (six) AM. Patient not taking: Reported on 09/29/2021 03/21/21   Hosie Poisson, MD  ondansetron (ZOFRAN) 4 MG tablet Take 1 tablet (4 mg total) by mouth daily as needed for nausea or vomiting. Patient not taking: Reported on 10/05/2021 11/20/20 11/20/21  Mercy Riding, MD  ondansetron (ZOFRAN) 4 MG tablet Take 1 tablet (4 mg total) by mouth every 6 (six) hours. Patient not taking: Reported on 09/14/2021 07/25/21   Valarie Merino, MD  potassium chloride SA (KLOR-CON) 20 MEQ tablet Take 1 tablet (20 mEq total) by mouth daily. Patient not taking: Reported on 10/05/2021 03/20/21   Hosie Poisson, MD  venlafaxine XR (EFFEXOR-XR) 75 MG 24 hr capsule Take 1 capsule (75 mg total) by mouth daily. Patient not taking: Reported on 10/05/2021 11/20/20   Mercy Riding, MD     Critical care time: 71

## 2021-10-11 NOTE — Op Note (Signed)
[facility] ?Patient Name: Judy Meyer ?Procedure Date : 10/10/2021 ?MRN: [id number] ?Attending MD: Carol Ada , MD ?Date of Birth: 16-Aug-1960 ?CSN: [id number] ?Age: 61 ?Admit Type: Inpatient ?Procedure:                Flexible Sigmoidoscopy ?Indications:              Hematochezia ?Providers:                Carol Ada, MD, Grace Isaac, RN, Frazier Richards,  ?                          Technician ?Referring MD:              ?Medicines:                None ?Complications:            No immediate complications. ?Estimated Blood Loss:     Estimated blood loss: none. ?Procedure:                Pre-Anesthesia Assessment: ?                          - Prior to the procedure, a History and Physical  ?                          was performed, and patient medications and  ?                          allergies were reviewed. The patient's tolerance of  ?                          previous anesthesia was also reviewed. The risks  ?                          and benefits of the procedure and the sedation  ?                          options and risks were discussed with the patient.  ?                          All questions were answered, and informed consent  ?                          was obtained. Prior Anticoagulants: The patient has  ?                          taken no previous anticoagulant or antiplatelet  ?                          agents. ASA Grade Assessment: IV - A patient with  ?                          severe systemic disease that is a constant threat  ?                          to life. After  reviewing the risks and benefits,  ?                          the patient was deemed in satisfactory condition to  ?                          undergo the procedure. ?                          - No sedation medications were administered. ?                          After obtaining informed consent, the scope was  ?                          passed under direct vision. The [id number]  ?                           ([id number]) Therapeutic endoscope was introduced  ?                          through the anus and advanced to the the sigmoid  ?                          colon. The flexible sigmoidoscopy was accomplished  ?                          without difficulty. The patient tolerated the  ?                          procedure well. The quality of the bowel  ?                          preparation was good. ?Scope In: ?Scope Out: ?Findings: ?     Red blood was found in the rectum and in the sigmoid colon. ?     The FFS was performed emergently. There was evidence of fresh blood and  ?     clots being expelled from the rectum. She exhibted large hemorrhoidal  ?     prolpase, but this was not the site of bleeding. In the rectum there was  ?     evidence of some fresh blood mixed with stool, but lavage did not yield  ?     a source. For most of the sigmoid colon the mucosa and lumen was clear  ?     of any blood. In the proximal sigmoid colon to the distal descending  ?     colon blood was identified. Her NG tube, which was placed a couple of  ?     hours before the FFS was negative for any blood. Gastric contents were  ?     suctioned without any evidence of blood to suggest a rapid upper GI  ?     source. A consensus with IR an CCM was reached to perform a CTA with a  ?     creatinine of 2.33 in hopes of isolating a bleeding source. ?Impression:               -  Blood in the rectum and in the sigmoid colon. ?                          - No specimens collected. ?Recommendation:           - Return patient to hospital ward for ongoing care. ?                          - CTA to localize bleeding. ?Procedure Code(s):        --- Professional --- ?                          [id number], Sigmoidoscopy, flexible; diagnostic,  ?                          including collection of specimen(s) by brushing or  ?                          washing, when performed (separate procedure) ?Diagnosis Code(s):        --- Professional --- ?                           K62.5, Hemorrhage of anus and rectum ?                          K92.2, Gastrointestinal hemorrhage, unspecified ?                          K92.1, Melena (includes Hematochezia) ?CPT copyright 2019 [employer]. All rights reserved. ?The codes documented in this report are preliminary and upon coder review may  ?be revised to meet current compliance requirements. ?Carol Ada, MD ?Carol Ada, MD ?10/29/2021 8:32:55 AM ?This report has been signed electronically. ?Number of Addenda: 0 ?

## 2021-10-11 NOTE — Progress Notes (Signed)
?   10/15/2021 0015  ?Notify: Provider  ?Provider Name/Title Carlisle Beers MD  ?Date Provider Notified 10/28/2021  ?Time Provider Notified [phone]  ?Notification Type  ?Clinical research associate)  ?Notification Reason Critical result  ?Provider response See new orders  ?Date of Provider Response 10/10/2021  ?Time of Provider Response 0020  ? ? ?

## 2021-10-11 NOTE — Progress Notes (Addendum)
Brief procedure note. ? ?Blood noted in the rectum and the sigmoid colon.  The endoscope was not able to be advanced further with her hypotension and performing the procedure unsedated.  There is hemorrhoidal prolapse, but no evidence of bleeding from the hemorrhoids. ? ?Recommendation: ?RBC scan. ? ?ADDENDUM: ? ?I spoke with IR and CCM.  The decision to perform a CTA was made even with her creatinine at 2.44.  This is a critical situation as the source of bleeding is unidentified and she is hypotensive from the bleeding. ?

## 2021-10-11 NOTE — Procedures (Signed)
Central Venous Catheter Insertion Procedure Note ? ?Judy Meyer  ?[id number]  ?Sep 28, 1960 ? ?Date:11/06/2021  ?Time:4:13 AM  ? ?Provider Performing:Rajinder Mesick  ? ?Procedure: Insertion of Non-tunneled Central Venous Catheter(36556) with US guidance (27253)  ? ?Indication(s) ?Medication administration ? ?Consent ?Risks of the procedure as well as the alternatives and risks of each were explained to the patient and/or caregiver.  Consent for the procedure was obtained and is signed in the bedside chart ? ?Anesthesia ?Topical only with 1% lidocaine  ? ?Timeout ?Verified patient identification, verified procedure, site/side was marked, verified correct patient position, special equipment/implants available, medications/allergies/relevant history reviewed, required imaging and test results available. ? ?Sterile Technique ?Maximal sterile technique including full sterile barrier drape, hand hygiene, sterile gown, sterile gloves, mask, hair covering, sterile ultrasound probe cover (if used). ? ?Procedure Description ?Area of catheter insertion was cleaned with chlorhexidine and draped in sterile fashion.  With real-time ultrasound guidance a central venous catheter (12 Fr, introducer catheter) was placed into the left femoral vein. Nonpulsatile blood flow and easy flushing noted in all ports.  The catheter was sutured in place and sterile dressing applied. ? ?Complications/Tolerance ?None; patient tolerated the procedure well. ?Chest X-ray is ordered to verify placement for internal jugular or subclavian cannulation.   Chest x-ray is not ordered for femoral cannulation. ? ?EBL ?Minimal ? ?Specimen(s) ?None ? ?

## 2021-10-11 NOTE — Progress Notes (Addendum)
I was informed by radiology that she had a CTA at 4:15 this AM.  It was a negative study. The CTA that I ordered will be cancelled.  RBC scan will be ordered. ? ?ADDENDUM: ?Bleeding scan is negative for any source of bleeding.  HGB at 7.8 g/dL from 15:30 today.  Baseline around 8-9 during this admission.  Levophed requirements decreasing. ?

## 2021-10-11 NOTE — Progress Notes (Addendum)
eLink Physician-Brief Progress Note ?Patient Name: Judy Meyer ?DOB: 1960-09-27 ?MRN: [id number] ? ? ?Date of Service ? 10/14/2021  ?HPI/Events of Note ? Transferred to ICU for ?GI: distended but soft abdomen with severe hepatomegaly, diffusely tender.  ?Rectal: Prolapse of bowel, bowel soft at anus, appears pink. Slow oozing bleed with occasional jet of blood. ? ?61 yr old female , Admitted for Acute alcoholic hepatitis, HRS-AGMA ?Cholelithiasis, ? Cholecystitis, decompensated liver failure.  ? ?Camera : ?HR 77, MAP 57. 100% sats on room air. ?Getting PRBC. ?Getting a central line-femoral. ? ?Data: ?Hg at 9.3, up from 8.1, wbc 31.9 K, platelet 148 K ?CBG 166. T B over 37. Elevated MELD score. ?AST/ALT/ALP: 355/92/189INR 2.7 ? ?MRI: limited exam from 3/3. ?severe hepatomegaly. No obvious mass or ?other discrete abnormality of the liver parenchyma on noncontrast ?MR. ?3. Distended gallbladder again noted, likely containing sludge and ?tiny gallstones. No biliary ductal dilatation. ?4. Small volume ascites, pleural effusions, and anasarca ? ?A/P: ?Lower GI bleeding. New rectal prolapse. Hypercoagulable from liver failure. Jaundice, effusion/ascites/elevated TB and INR. Anemia. ?- GI been called. Getting central line and pRBC.  ?- keep MAP > 65DIC work up.  ?- asp precautions ?CTA GI bleed and NM scan stat. ?- follow electrolytes and replace if low ?- continue zosyn, rifaximin, lactulose. No asterixis or tremors. ?- keep K/Mag > 4 and 2. ?- follow UOP.  ?-on PPI ?- watch for hypoglycemia ?-  ?2. 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 On MVI for alcohol abuse. ?- sz precautions ? ? ? ?  ?eICU Interventions ? SCD as VTE prophylaxis.   ? ? ? ?Intervention Category ?Intermediate Interventions: Bleeding - evaluation and treatment with blood products ?Evaluation Type: New Patient Evaluation ? ?[name] ?10/18/2021, 3:05 AM ? ?5:11 ?Camera follow up. ?Came from CTA scan. Report pending. Stable Vital sign. HR 74, MAP 72. Levo at 3  mcg/minhas NG tube. ? ?Continue care.  ? ?5:55 ?ABG reviewed. Partially compensated metabolic acidosis.  ?Discussed with RN. On levophed gtt at 4. ?- start sodium bicarb at 50 ml/hr in dextrose.  ? ?6:40 ?CTA:  ?IMPRESSION: ?VASCULAR ?  ?1. No contrast extravasation identified within the bowel lumen. ?2. Normal abdominal and pelvic arteries with minimal ?atherosclerosis. ?3. Mass effect on the intrahepatic portal veins and hepatic veins ?related to abnormal liver. ?  ?NON-VASCULAR ?  ?1. Anasarca, with stable small layering pleural effusions. Small ?volume ascites which now has mildly complex fluid density - new from ?two days ago. Consider peritonitis. Hemoperitoneum felt less likely. ?2. Severe abnormal Liver, unchanged. Gallbladder Hydrops (200 mL ?gallbladder). ?3. Delayed bilateral nephrograms suggesting acute intrinsic renal ?disease / acute renal insufficiency. No obstructive uropathy. ?4. Indistinct pancreas stable since 10/09/2021. Difficult to exclude ?Acute Pancreatitis. ?5. Featureless large and small bowel loops with no bowel obstruction ?or discrete bowel inflammation identified. No free air. Enteric tube ?terminates in the distal stomach ? ?As per bed side RN.  ?Still has fresh blood per rectum. No bleeding mentioned in CTA. Could be rectal varices?. Hg stable at 8.6. plt 123.  ?GI to follow through. Dr Griffith Citron is on his way to see her. Secure chat done. ? ? ?

## 2021-10-11 NOTE — Progress Notes (Signed)
?   10/10/21 2217  ?Notify: Provider  ?Provider Name/Title Rachel Bo MD  ?Date Provider Notified 10/10/21  ?Time Provider Notified 2217  ?Notification Type  ?(Secured text)  ?Notification Reason Other (Comment) ?(bright red BM)  ?Provider response See new orders ?(Md will page GI and transfuse blood)  ?Date of Provider Response 10/10/21  ?Time of Provider Response 2218  ? ? ?

## 2021-10-11 NOTE — Progress Notes (Signed)
NAME:  Judy Meyer, MRN:  [id number], DOB:  April 04, 1961, LOS: 8 ADMISSION DATE:  10/06/2021, CONSULTATION DATE:  11/06/2021 REFERRING MD:  Gilles Chiquito, MD, CHIEF COMPLAINT:  BRBPR  History of Present Illness:  Patient is a 61 year old lady was admitted on 09/25/2021 with jaundice.  Over the course of stay the tentative diagnosis of alcoholic hepatitis was made, patient was on prednisone, that did not improve her overall clinical picture.  It was also found out that patient is having acute cholecystitis, general surgery, gastroenterology and IR were consulted.  Eventually patient was scheduled for IR guided cholecystostomy tube on 11/05/2021.  On the night between 10/10/2021 and 10/21/2021 patient started having bright red blood per rectum and she started dropping her blood pressure.  Critical care was consulted for admission to the ICU.  Patient was transfused 1 unit of packed red blood cells and GI was consulted before transferring the patient to the ICU.  Primary team has trended hemoglobin, her initial hemoglobin on 10/10/2021 was around 6.1, patient was given 1 unit of PRBC and her hemoglobin improved to 9.3.  Overnight her blood pressure dropped and due to multiple bowel movements she was given another unit of packed red blood cell.  Pertinent  Medical History  Alcohol abuse, essential hypertension, hypothyroidism, liver disease being followed at [facility], history of seizures.  Significant Hospital Events: Including procedures, antibiotic start and stop dates in addition to other pertinent events   09/29/2021: Admitted with jaundice evaded liver enzymes, working diagnosis of acute liver failure due to alcoholic hepatitis started on prednisone.  On Zosyn since 10/04/2021.  Started on lactulose and rifaximin for hepatic encephalopathy 10/04/2021: GI and general surgery have evaluated the patient for acute cholecystitis. 09/15/2021: Critical care was consulted due to multiple metabolic derangements. 10/08/2021:  Worsening abdominal distention, repeat CT scan of the abdomen and pelvis performed 10/09/2021: Prednisone was stopped due to minimal improvement in [facility] 0.981 on day 7 10/10/2021: Started having lower GI bleed with worsening hypotension.  Albumin given.  ICU was consulted overnight  Interim History / Subjective:  Patient underwent sigmoidoscopy this morning which showed frank blood but no source was found Patient has received 3 units of PRBCs Awaiting CT angiogram abdomen pelvis  Objective   Blood pressure (!) 98/54, pulse 79, temperature (!) 97.5 F (36.4 C), resp. rate 16, height '5\' 6"'$  (1.676 m), weight 78.1 kg, last menstrual period 06/11/2018, SpO2 100 %.        Intake/Output Summary (Last 24 hours) at 10/24/2021 0915 Last data filed at 10/27/2021 0800 Gross per 24 hour  Intake 2541.3 ml  Output 355 ml  Net 2186.3 ml   Filed Weights   10/04/21 0337 10/09/2021 0255  Weight: 63.7 kg 78.1 kg    Physical exam: General: Acute on chronically ill-appearing female, lying on the bed, grossly jaundiced HEENT: [place]/AT, eyes icteric.  moist mucus membranes Neuro: Alert, awake following commands Chest: Coarse breath sounds, no wheezes or rhonchi Heart: Regular rate and rhythm, no murmurs or gallops Abdomen: Soft, nontender, nondistended, bowel sounds present Skin: No rash.  Jaundiced  MELD score 39  Serum sodium 130, potassium 3.3, bicarbonate 15, BUN 28, serum creatinine 2.3, serum calcium 6.2, total bilirubin 24.7, INR 3.1, lactic acid 4.3  Resolved Hospital Problem list     Assessment & Plan:  Hypovolemic shock, hemorrhagic shock, acute blood loss anemia due to GI bleed, likely lower GI bleed.  There might be component of septic shock from likely GI source, acute cholecystitis  CT abdomen and pelvis is pending Patient underwent sigmoidoscopy this morning, which showed frank bright red blood but no source was found Spoke with GI, discussed the benefit and risk of CT angiogram, at  this time benefit outweighs the risk considering she is unstable requiring vasopressor support She has received 3 units of PRBCs Trend H&H every 6 hours and transfuse if less than 7 Trend lactate Continue octreotide and Protonix Continue Levophed with map goal 60-65  Acute liver failure, likely alcoholic hepatitis, worsening.  No response to 7 days of steroids Acute cholecystitis, scheduled to have cholecystostomy tube placement  Hepatic encephalopathy Active alcohol abuse, alcohol use disorder Coagulopathy related to acute liver failure Continue Zosyn, lactulose and rifaximin GI, General surgery and IR are following Trend LFTs Once stable, consider transferring her to transplant center Her MELD score is 39 with expected mortality 53% in the next 3 months  Acute kidney injury, unclear etiology likely ATN versus HRS Acute metabolic acidosis with partial respiratory compensation Lactic acidosis Serum creatinine continue to rise Continue IV bicarbonate infusion Patient has received albumin Trend serum creatinine and electrolytes  Hypoglycemia in the setting of shock and acute liver injury Continue D5W at 75cc/h  Hypothyroidism Continue home medication  Hyponatremia/hypokalemia/hypocalcemia/hypophosphatemia Continue aggressive electrolyte supplement  History of seizure disorder Continue home medication   Best Practice (right click and "Reselect all SmartList Selections" daily)   Diet/type: NPO DVT prophylaxis: SCD GI prophylaxis: PPI Lines: Central line Foley:  Yes, and it is still needed Code Status:  full code Last date of multidisciplinary goals of care discussion [3/5: Updated patient's boyfriend at bedside, explained that prognosis is guarded, has high mortality despite everything we can do.  He would like to continue full scope of care while keeping him full code]  Labs   CBC: Recent Labs  Lab 10/10/21 0824 10/10/21 1813 10/10/21 2358 10/21/2021 0420  10/18/2021 0508 10/29/2021 0858  WBC 35.0* 33.6* 31.9*  --  33.5* 41.7*  NEUTROABS  --   --  23.0*  --   --   --   HGB 6.9* 8.1*   8.0* 9.3* 9.2* 8.6* 9.0*  HCT 20.7* 23.3*   23.5* 26.5* 27.0* 24.9* 25.3*  MCV 107.3* 100.0 96.0  --  91.2 90.0  PLT 192 180 148*  --  123* 143*    Basic Metabolic Panel: Recent Labs  Lab 10/04/21 1804 10/04/21 2008 10/05/21 1137 10/05/21 1523 10/06/21 0519 10/07/21 0254 10/07/21 1427 10/08/21 0646 10/09/21 0131 10/10/21 0824 10/15/2021 0420 11/04/2021 0508  NA 131*   < > 136   < > 135   < > 134* 138 132* 131* 130* 130*  K 2.9*   < > 3.7   < > 3.5   < > 3.4* 3.1* 3.2* 3.8 3.4* 3.3*  CL 100   < > 101   < > 100   < > 104 104 103 100  --  100  CO2 16*   < > 21*   < > 24   < > 17* 20* 16* 15*  --  15*  GLUCOSE 132*   < > 111*   < > 103*   < > 118* 87 98 68*  --  107*  BUN 10   < > 10   < > 9   < > '12 13 18 '$ 24*  --  28*  CREATININE 1.12*   < > 1.14*   < > 1.06*   < > 1.32* 1.33* 1.56* 1.85*  --  2.33*  CALCIUM 7.9*   < > 8.2*   < > 8.2*   < > 7.6* 7.5* 7.2* 7.3*  --  6.2*  MG 1.8  --  1.8  --  2.1  --   --   --   --   --   --  1.8  PHOS  --   --   --   --   --   --   --   --   --   --   --  2.4*   < > = values in this interval not displayed.   GFR: Estimated Creatinine Clearance: 27.4 mL/min (A) (by C-G formula based on SCr of 2.33 mg/dL (H)). Recent Labs  Lab 10/06/21 0823 10/06/21 1121 10/08/21 0646 10/09/21 0131 10/10/21 1813 10/10/21 2358 10/10/2021 0508 10/20/2021 0858  WBC  --   --  30.8*   < > 33.6* 31.9* 33.5* 41.7*  LATICACIDVEN 2.6* 2.6* 2.4*  --   --   --  4.3*  --    < > = values in this interval not displayed.    Liver Function Tests: Recent Labs  Lab 10/07/21 1427 10/08/21 0646 10/09/21 0131 10/10/21 0824 11/02/2021 0508  AST 370* 387* 406* 355* 241*  ALT 100* 107* 110* 92* 59*  ALKPHOS 145* 152* 206* 189* 119  BILITOT 32.7* 33.8* 34.1* 37.7* 24.7*  PROT 6.0* 5.7* 6.0* 5.7* 4.2*  ALBUMIN 2.1* 1.9* 2.0* 2.7* 2.3*   No  results for input(s): LIPASE, AMYLASE in the last 168 hours. No results for input(s): AMMONIA in the last 168 hours.  ABG    Component Value Date/Time   PHART 7.359 10/10/2021 0420   PCO2ART 19.6 (LL) 11/02/2021 0420   PO2ART 90 10/10/2021 0420   HCO3 11.1 (L) 10/22/2021 0420   TCO2 12 (L) 10/10/2021 0420   ACIDBASEDEF 13.0 (H) 10/21/2021 0420   O2SAT 97 10/12/2021 0420     Coagulation Profile: Recent Labs  Lab 10/07/21 0254 10/08/21 0646 10/09/21 0131 10/10/21 0824 10/29/2021 0508  INR 2.0* 2.5* 2.5* 2.7* 3.1*    Cardiac Enzymes: No results for input(s): CKTOTAL, CKMB, CKMBINDEX, TROPONINI in the last 168 hours.  HbA1C: Hgb A1c MFr Bld  Date/Time Value Ref Range Status  07/18/2020 01:27 PM 5.1 4.8 - 5.6 % Final    Comment:    (NOTE) Pre diabetes:          5.7%-6.4%  Diabetes:              >6.4%  Glycemic control for   <7.0% adults with diabetes     CBG: Recent Labs  Lab 10/10/21 1602 10/26/2021 0004 10/26/2021 0037 11/06/2021 0338 10/23/2021 0750  GLUCAP 74 49* 166* 76 147*    Critical care time:     Additional critical care time: 34 minutes  Performed by: [place] care time was exclusive of separately billable procedures and treating other patients.   Critical care was necessary to treat or prevent imminent or life-threatening deterioration.   Critical care was time spent personally by me on the following activities: development of treatment plan with patient and/or surrogate as well as nursing, discussions with consultants, evaluation of patient's response to treatment, examination of patient, obtaining history from patient or surrogate, ordering and performing treatments and interventions, ordering and review of laboratory studies, ordering and review of radiographic studies, pulse oximetry and re-evaluation of patient's condition.   Jacky Kindle MD [facility] See Amion for pager If  no response to pager, please call  [phone] until 7pm After 7pm, Please call E-link [phone]

## 2021-10-11 NOTE — Progress Notes (Signed)
Interventional Radiology Brief Note: ? ?Patient with plans for percutaneous cholecystostomy tube placement, however she underwent attempted flex sigmoidoscopy yesterday without sedation and was noted to have blood in the rectum and sigmoid colon.  She is currently hypotensive.  ? ?Plan made to proceed with CTA to identify source of bleeding. Perc chole on hold at this time.  ? ?Brynda Greathouse, MS RD PA-C ? ? ?

## 2021-10-11 NOTE — Progress Notes (Addendum)
eLink Physician-Brief Progress Note ?Patient Name: Judy Meyer ?DOB: 1961/03/05 ?MRN: [id number] ? ? ?Date of Service ? 10/08/2021  ?HPI/Events of Note ? Asked to follow up on Bleeding scan. Report has now resulted. No active bleeding.  ? ?Just now, bedside RN requested ground team evaluate patient for groin hematoma. They are going to assess.   ?eICU Interventions ? Sending CBC and a PT INR as well now. She looks stable on camera. HR in 80s SBP 110.   ? ? ? ?Intervention Category ?Major Interventions: Hemorrhage - evaluation and management ? ?Rashee Marschall G Suellen Durocher ?10/20/2021, 8:30 PM ? ?Addendum at 10:30 pm  ?Retrospective entry - had noted labs. Stable. Continue serial checks as planned.  ?

## 2021-10-12 ENCOUNTER — Inpatient Hospital Stay (HOSPITAL_COMMUNITY): Payer: Medicare PPO

## 2021-10-12 ENCOUNTER — Encounter (HOSPITAL_COMMUNITY): Payer: Self-pay | Admitting: Gastroenterology

## 2021-10-12 DIAGNOSIS — E872 Acidosis, unspecified: Secondary | ICD-10-CM | POA: Diagnosis not present

## 2021-10-12 DIAGNOSIS — K767 Hepatorenal syndrome: Secondary | ICD-10-CM

## 2021-10-12 DIAGNOSIS — N179 Acute kidney failure, unspecified: Secondary | ICD-10-CM | POA: Diagnosis not present

## 2021-10-12 DIAGNOSIS — K922 Gastrointestinal hemorrhage, unspecified: Secondary | ICD-10-CM

## 2021-10-12 DIAGNOSIS — K7200 Acute and subacute hepatic failure without coma: Secondary | ICD-10-CM | POA: Diagnosis not present

## 2021-10-12 DIAGNOSIS — Z515 Encounter for palliative care: Secondary | ICD-10-CM

## 2021-10-12 HISTORY — PX: IR PERC CHOLECYSTOSTOMY: IMG2326

## 2021-10-12 LAB — PREPARE FRESH FROZEN PLASMA
Unit division: 0
Unit division: 0

## 2021-10-12 LAB — GLUCOSE, CAPILLARY
Glucose-Capillary: 94 mg/dL (ref 70–99)
Glucose-Capillary: 86 mg/dL (ref 70–99)
Glucose-Capillary: 76 mg/dL (ref 70–99)
Glucose-Capillary: 68 mg/dL — ABNORMAL LOW (ref 70–99)
Glucose-Capillary: 132 mg/dL — ABNORMAL HIGH (ref 70–99)
Glucose-Capillary: 106 mg/dL — ABNORMAL HIGH (ref 70–99)
Glucose-Capillary: 97 mg/dL (ref 70–99)

## 2021-10-12 LAB — BASIC METABOLIC PANEL
Anion gap: 13 (ref 5–15)
Anion gap: 14 (ref 5–15)
BUN: 32 mg/dL — ABNORMAL HIGH (ref 6–20)
BUN: 34 mg/dL — ABNORMAL HIGH (ref 6–20)
CO2: 17 mmol/L — ABNORMAL LOW (ref 22–32)
CO2: 18 mmol/L — ABNORMAL LOW (ref 22–32)
Calcium: 6.4 mg/dL — CL (ref 8.9–10.3)
Calcium: 6.6 mg/dL — ABNORMAL LOW (ref 8.9–10.3)
Chloride: 101 mmol/L (ref 98–111)
Chloride: 98 mmol/L (ref 98–111)
Creatinine, Ser: 2.64 mg/dL — ABNORMAL HIGH (ref 0.44–1.00)
Creatinine, Ser: 2.76 mg/dL — ABNORMAL HIGH (ref 0.44–1.00)
GFR, Estimated: 20 mL/min — ABNORMAL LOW (ref >60)
GFR, Estimated: 19 mL/min — ABNORMAL LOW (ref >60)
Glucose, Bld: 97 mg/dL (ref 70–99)
Glucose, Bld: 154 mg/dL — ABNORMAL HIGH (ref 70–99)
Potassium: 3.2 mmol/L — ABNORMAL LOW (ref 3.5–5.1)
Potassium: 3.6 mmol/L (ref 3.5–5.1)
Sodium: 131 mmol/L — ABNORMAL LOW (ref 135–145)
Sodium: 130 mmol/L — ABNORMAL LOW (ref 135–145)

## 2021-10-12 LAB — BPAM FFP
Blood Product Expiration Date: 202303082359
Blood Product Expiration Date: 202303082359
ISSUE DATE / TIME: 202303050934
ISSUE DATE / TIME: 202303050934
Unit Type and Rh: 6200
Unit Type and Rh: 6200

## 2021-10-12 LAB — CBC
HCT: 19.2 % — ABNORMAL LOW (ref 36.0–46.0)
HCT: 16.9 % — ABNORMAL LOW (ref 36.0–46.0)
Hemoglobin: 7 g/dL — ABNORMAL LOW (ref 12.0–15.0)
Hemoglobin: 6 g/dL — CL (ref 12.0–15.0)
MCH: 32.6 pg (ref 26.0–34.0)
MCH: 32.4 pg (ref 26.0–34.0)
MCHC: 36.5 g/dL — ABNORMAL HIGH (ref 30.0–36.0)
MCHC: 35.5 g/dL (ref 30.0–36.0)
MCV: 89.3 fL (ref 80.0–100.0)
MCV: 91.4 fL (ref 80.0–100.0)
Platelets: 127 10*3/uL — ABNORMAL LOW (ref 150–400)
Platelets: 127 10*3/uL — ABNORMAL LOW (ref 150–400)
RBC: 2.15 MIL/uL — ABNORMAL LOW (ref 3.87–5.11)
RBC: 1.85 MIL/uL — ABNORMAL LOW (ref 3.87–5.11)
RDW: 21.5 % — ABNORMAL HIGH (ref 11.5–15.5)
RDW: 22.7 % — ABNORMAL HIGH (ref 11.5–15.5)
WBC: 39 10*3/uL — ABNORMAL HIGH (ref 4.0–10.5)
WBC: 39.1 10*3/uL — ABNORMAL HIGH (ref 4.0–10.5)
nRBC: 6.3 % — ABNORMAL HIGH (ref 0.0–0.2)
nRBC: 3.2 % — ABNORMAL HIGH (ref 0.0–0.2)

## 2021-10-12 LAB — LACTIC ACID, PLASMA: Lactic Acid, Venous: 2.2 mmol/L (ref 0.5–1.9)

## 2021-10-12 LAB — PREPARE RBC (CROSSMATCH)

## 2021-10-12 LAB — PROTIME-INR
INR: 2.3 — ABNORMAL HIGH (ref 0.8–1.2)
INR: 2.1 — ABNORMAL HIGH (ref 0.8–1.2)
Prothrombin Time: 25 seconds — ABNORMAL HIGH (ref 11.4–15.2)
Prothrombin Time: 23.5 seconds — ABNORMAL HIGH (ref 11.4–15.2)

## 2021-10-12 LAB — POTASSIUM: Potassium: 3.5 mmol/L (ref 3.5–5.1)

## 2021-10-12 LAB — UREA NITROGEN, URINE: Urea Nitrogen, Ur: 175 mg/dL

## 2021-10-12 LAB — MAGNESIUM: Magnesium: 2 mg/dL (ref 1.7–2.4)

## 2021-10-12 IMAGING — US IR CHOLECYSTOSTOMY
2 series · 3 of 3 positions shown · non-contrast
Comparison: none

CLINICAL DATA: Sepsis, gallbladder hydrops, clinical diagnosis of
acute cholecystitis

EXAM:
PERCUTANEOUS CHOLECYSTOSTOMY TUBE PLACEMENT WITH ULTRASOUND AND
FLUOROSCOPIC GUIDANCE
FLUOROSCOPY:
Radiation Exposure Index (as provided by the fluoroscopic device):
10 mGy air Kerma
TECHNIQUE: Survey ultrasound of the abdomen was performed and an appropriate
skin entry site was identified. Skin site was marked, prepped with
Betadine, and draped in usual sterile fashion, and infiltrated
locally with 1% lidocaine.

[Series 1: ir (id) (id)/(id)/(id) ir · 1 of 1 slices shown]
[im 1/1]
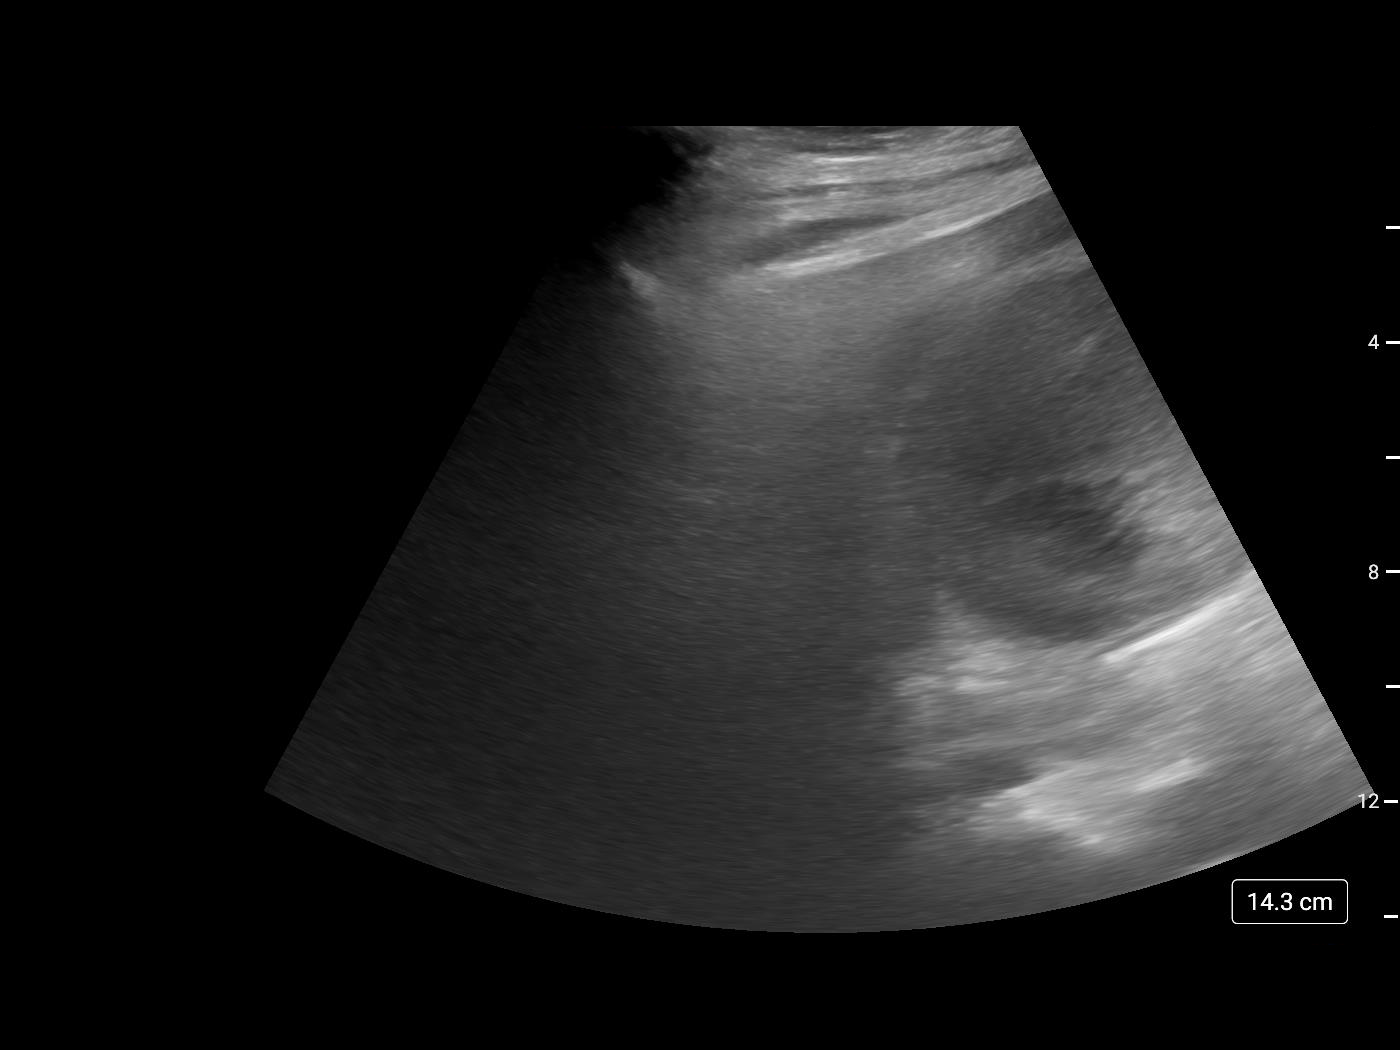

[Series 300: ir perc cholecystostomy · 2 of 2 slices shown]
[im 1/2]
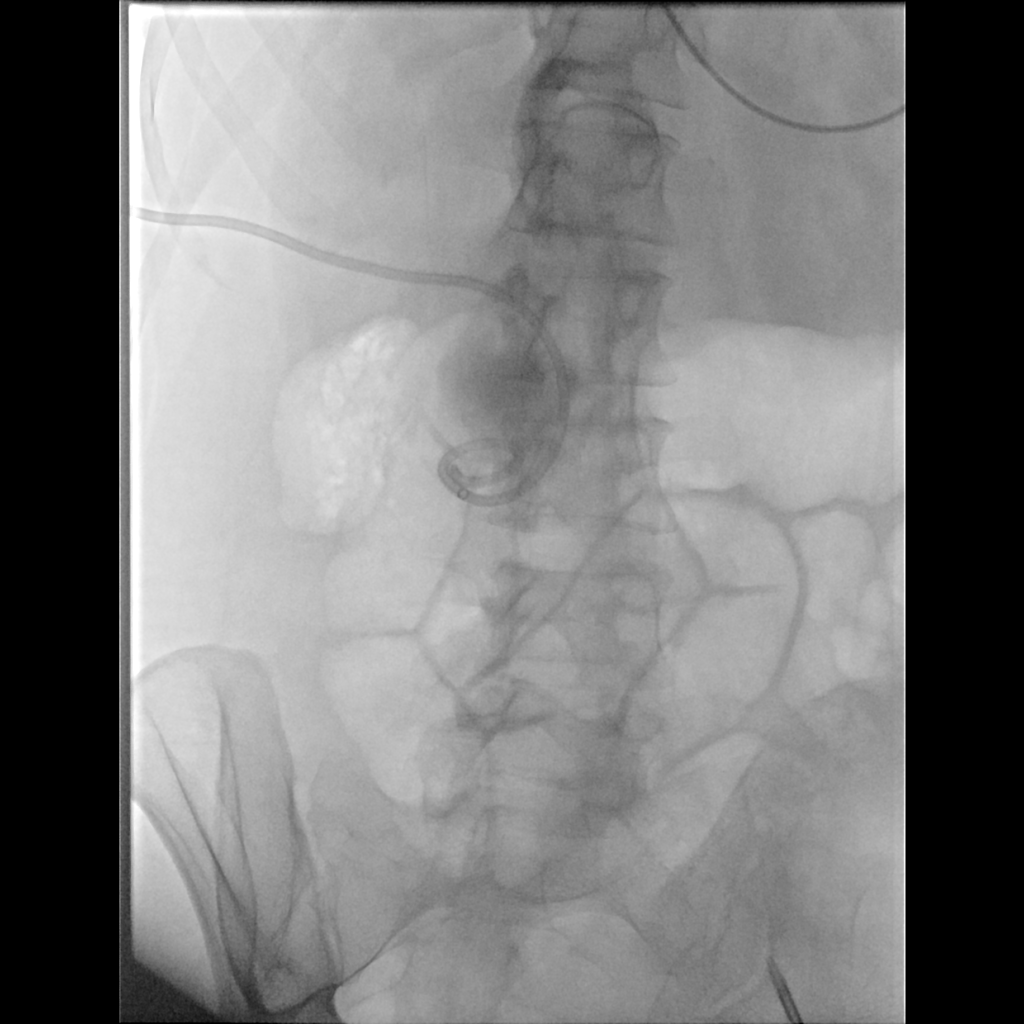
[im 2/2]
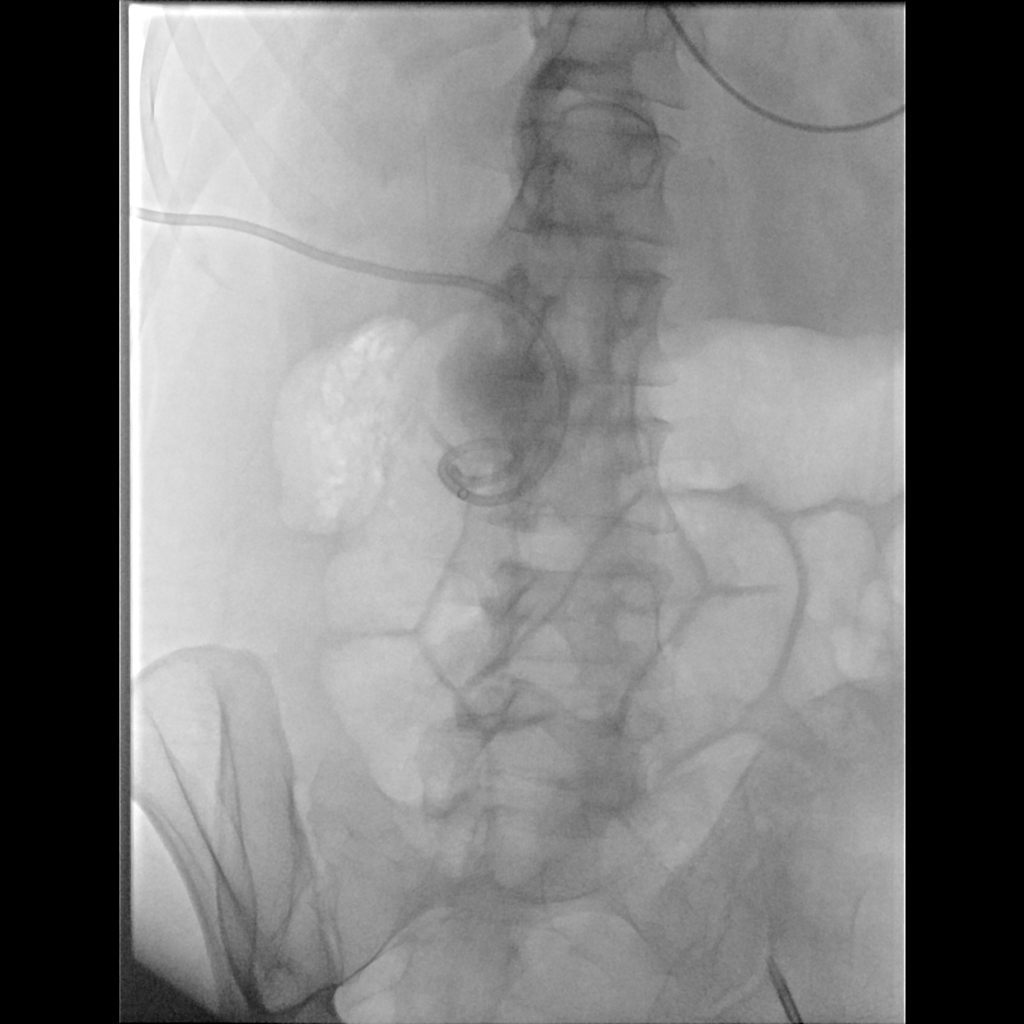

[3 of 3 positions shown; findings below may reference images not displayed]

Intravenous versed 1mg were administered as anxiolytic during
continuous monitoring of the patient's level of consciousness and
physiological / cardiorespiratory status by the radiology RN. Under
real-time ultrasound guidance, gallbladder was accessed using a
transhepatic approach with a 21-gauge needle. Ultrasound image
documentation was saved. Bile returned through the hub. Needle was
exchanged over a 018 guidewire for transitional dilator which
allowed placement of 035 J wire. Over this, a 10.2 French pigtail
catheter was advanced and formed centrally in the gallbladder lumen.
Small contrast injection confirmed appropriate position. Catheter
secured externally with 0 Prolene suture and placed external drain
bag. Patient tolerated the procedure well.

COMPLICATIONS:
COMPLICATIONS
none
IMPRESSION: 1. Technically successful percutaneous cholecystostomy tube
placement with ultrasound and fluoroscopic guidance.

## 2021-10-12 IMAGING — DX DG CHEST 1V PORT
1 series · 1 of 1 positions shown · non-contrast
Comparison: [DATE].

CLINICAL DATA: Encounter for nasogastric (NG) tube placement [77]
([77]-CM)

EXAM:
PORTABLE CHEST 1 VIEW

[chest]
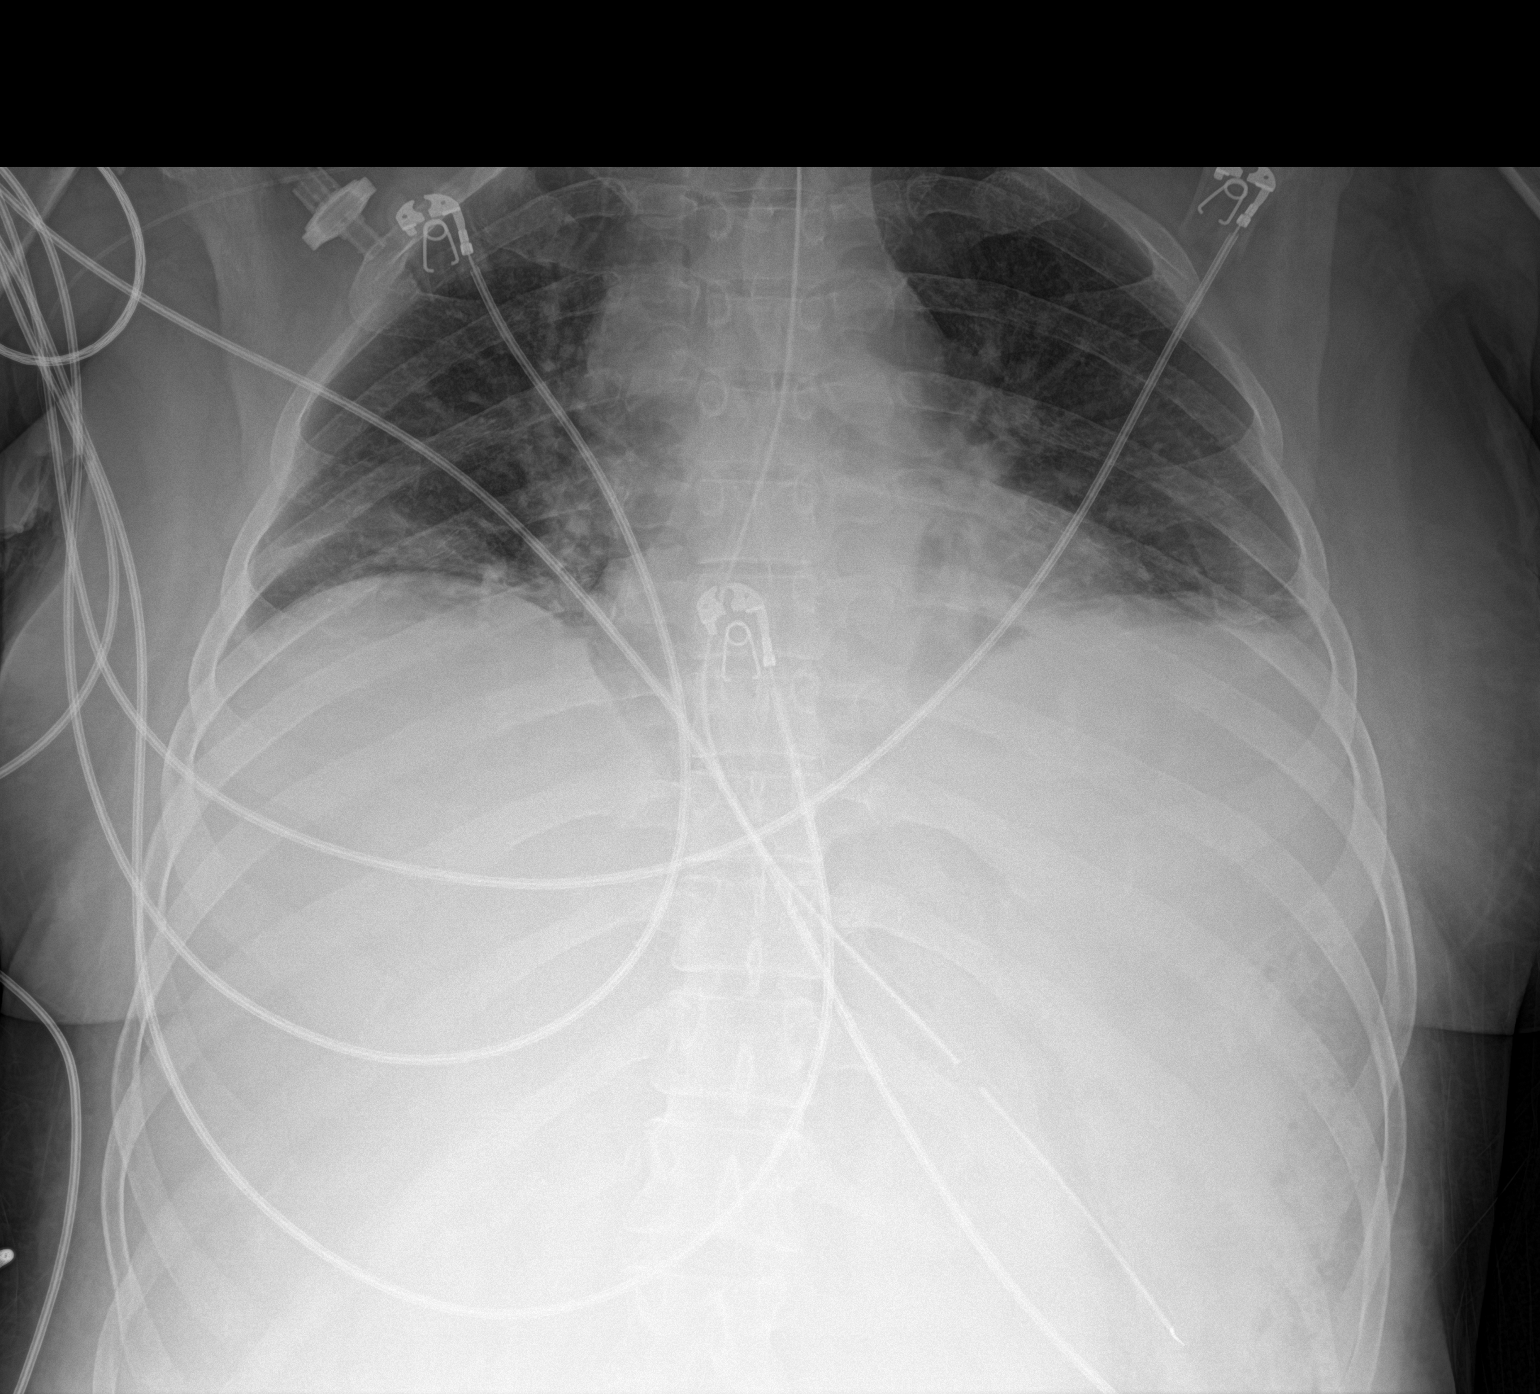

[1 of 1 positions shown; findings below may reference images not displayed]

FINDINGS: Is a gastric tube courses below the diaphragm with the tip in the
expected region the stomach. Side port is below the GE junction.
Similar appearance of low lung volumes with bibasilar opacities. No
visible pleural effusions or pneumothorax. Similar cardiomediastinal
silhouette, accentuated by AP technique and low lung volumes.
IMPRESSION: 1. Nasogastric tube in the stomach with side port below the GE
junction.
2. Similar low lung volumes and bibasilar opacities, most likely
atelectasis.

## 2021-10-12 MED ORDER — POTASSIUM CHLORIDE 10 MEQ/50ML IV SOLN
10.0000 meq | INTRAVENOUS | Status: AC
  Administered 2021-10-12 (×2): 10 meq via INTRAVENOUS
  Filled 2021-10-12 (×2): qty 50

## 2021-10-12 MED ORDER — ALBUMIN HUMAN 25 % IV SOLN
25.0000 g | Freq: Three times a day (TID) | INTRAVENOUS | Status: DC
  Administered 2021-10-12 – 2021-10-13 (×4): 25 g via INTRAVENOUS
  Filled 2021-10-12 (×4): qty 100

## 2021-10-12 MED ORDER — LIDOCAINE HCL 1 % IJ SOLN
INTRAMUSCULAR | Status: AC
  Filled 2021-10-12: qty 20

## 2021-10-12 MED ORDER — IOHEXOL 300 MG/ML  SOLN: 100.0000 mL | Freq: Once | INTRAMUSCULAR | Status: DC | PRN

## 2021-10-12 MED ORDER — POTASSIUM CHLORIDE 10 MEQ/100ML IV SOLN
10.0000 meq | INTRAVENOUS | Status: AC
  Administered 2021-10-12 (×2): 10 meq via INTRAVENOUS
  Filled 2021-10-12 (×2): qty 100

## 2021-10-12 MED ORDER — SODIUM CHLORIDE 0.9% IV SOLUTION: Freq: Once | INTRAVENOUS | Status: AC

## 2021-10-12 MED ORDER — LACTATED RINGERS IV BOLUS
500.0000 mL | Freq: Once | INTRAVENOUS | Status: AC
  Administered 2021-10-12: 500 mL via INTRAVENOUS

## 2021-10-12 MED ORDER — LIDOCAINE HCL (PF) 1 % IJ SOLN
INTRAMUSCULAR | Status: DC | PRN
  Administered 2021-10-12: 5 mL

## 2021-10-12 MED ORDER — POTASSIUM CHLORIDE 10 MEQ/50ML IV SOLN
10.0000 meq | INTRAVENOUS | Status: DC
Start: 2021-10-12 — End: 2021-10-12

## 2021-10-12 MED ORDER — VITAMIN K1 10 MG/ML IJ SOLN
10.0000 mg | Freq: Once | INTRAVENOUS | Status: AC
  Administered 2021-10-12: 10 mg via INTRAVENOUS
  Filled 2021-10-12: qty 1

## 2021-10-12 MED ORDER — CALCIUM GLUCONATE-NACL 2-0.675 GM/100ML-% IV SOLN
2.0000 g | Freq: Once | INTRAVENOUS | Status: AC
  Administered 2021-10-12: 2000 mg via INTRAVENOUS
  Filled 2021-10-12: qty 100

## 2021-10-12 MED ORDER — ZINC OXIDE 40 % EX OINT
TOPICAL_OINTMENT | CUTANEOUS | Status: DC
  Filled 2021-10-12: qty 57

## 2021-10-12 MED ORDER — MIDAZOLAM HCL 2 MG/2ML IJ SOLN
INTRAMUSCULAR | Status: AC
  Filled 2021-10-12: qty 2

## 2021-10-12 MED ORDER — DEXTROSE 10 % IV SOLN
INTRAVENOUS | Status: DC
Start: 2021-10-12 — End: 2021-10-13

## 2021-10-12 MED ORDER — DEXTROSE 50 % IV SOLN
INTRAVENOUS | Status: AC
  Administered 2021-10-12: 50 mL
  Filled 2021-10-12: qty 50

## 2021-10-12 NOTE — Progress Notes (Signed)
NAME:  Judy Meyer, MRN:  [id number], DOB:  1961/02/18, LOS: 9 ADMISSION DATE:  09/25/2021, CONSULTATION DATE:  10/12/21 REFERRING MD:  Gilles Chiquito, MD, CHIEF COMPLAINT:  BRBPR  History of Present Illness:  Patient is a 61 year old lady was admitted on 09/18/2021 with jaundice.  Over the course of stay the tentative diagnosis of alcoholic hepatitis was made, patient was on prednisone, that did not improve her overall clinical picture.  It was also found out that patient is having acute cholecystitis, general surgery, gastroenterology and IR were consulted.  Eventually patient was scheduled for IR guided cholecystostomy tube on 10/10/2021.  On the night between 10/10/2021 and 10/25/2021 patient started having bright red blood per rectum and she started dropping her blood pressure.  Critical care was consulted for admission to the ICU.  Patient was transfused 1 unit of packed red blood cells and GI was consulted before transferring the patient to the ICU.  Primary team has trended hemoglobin, her initial hemoglobin on 10/10/2021 was around 6.1, patient was given 1 unit of PRBC and her hemoglobin improved to 9.3.  Overnight her blood pressure dropped and due to multiple bowel movements she was given another unit of packed red blood cell.  Pertinent  Medical History  Alcohol abuse, essential hypertension, hypothyroidism, liver disease being followed at [facility], history of seizures.  Significant Hospital Events: Including procedures, antibiotic start and stop dates in addition to other pertinent events   09/11/2021: Admitted with jaundice evaded liver enzymes, working diagnosis of acute liver failure due to alcoholic hepatitis started on prednisone.  On Zosyn since 10/04/2021.  Started on lactulose and rifaximin for hepatic encephalopathy 10/04/2021: GI and general surgery have evaluated the patient for acute cholecystitis. 09/15/2021: Critical care was consulted due to multiple metabolic derangements. 10/08/2021:  Worsening abdominal distention, repeat CT scan of the abdomen and pelvis performed 10/09/2021: Prednisone was stopped due to minimal improvement in [facility] 0.981 on day 7 10/10/2021: Started having lower GI bleed with worsening hypotension.  Albumin given.  ICU was consulted overnight 3/5 flex sig with no evidence of bleeding. CTA negative. Perc chole drain on hold due to GI bleeding. 3 units of PRBC, 2 units plasma  Interim History / Subjective:  Groin hematoma at central line site noted overnight.   Objective   Blood pressure (!) 128/50, pulse 83, temperature (!) 97.3 F (36.3 C), resp. rate 18, height '5\' 6"'$  (1.676 m), weight 78.1 kg, last menstrual period 06/11/2018, SpO2 100 %.        Intake/Output Summary (Last 24 hours) at 10/12/2021 0726 Last data filed at 10/12/2021 0600 Gross per 24 hour  Intake 2237.34 ml  Output 230 ml  Net 2007.34 ml    Filed Weights   10/04/21 0337 10/31/2021 0255  Weight: 63.7 kg 78.1 kg    Physical exam: General: Jaundiced middle aged female in [place] HEENT: [place]/AT, scleral icterus. No JVD Neuro: Alert, awake, following commands, non-focal.  Chest: Clear bilateral breath sounds Heart: RRR, no MRG Abdomen: Soft, non-distended. Mild generalized tenderness.  Skin: No rash. Significant jaundice. Large hematoma from L fem CVL site. Seems to be expanding.    Serum sodium 131, potassium 3.2, bicarbonate 17, BUN 28, serum creatinine 2.64 (up from 2.3), serum calcium 6.4 (corrects to 7.7), total bilirubin 24.7 (downtrending), INR 2.1  Resolved Hospital Problem list     Assessment & Plan:  Hypovolemic shock, hemorrhagic shock, acute blood loss anemia due to GI bleed, likely lower GI bleed.  There might be  component of septic shock, acute cholecystitis - Trend H&H every 6 hours and transfuse if less than 7 - ensure lactic has cleared - Trend INR - Continue octreotide and Protonix - Continue Levophed with map goal 60-65 mmHg  Acute liver failure, likely  alcoholic hepatitis, worsening.  No response to 7 days of steroids - Her MELD score is 39 with expected mortality 53% in the next 3 months Hepatic encephalopathy - lactulose and rifaximin - GI following - Trend LFTs - Once stable, consider transferring her to transplant center  Acute cholecystitis, scheduled to have cholecystostomy tube placement  - pending perc drain placement - IR following - Zosyn continue  Acute kidney injury, unclear etiology likely ATN versus HRS Acute metabolic acidosis with partial respiratory compensation Lactic acidosis - Serum creatinine trending up - PO Bicarb tablets continue - repeat lactic, ensure clearing. - Trend serum creatinine and electrolytes - potassium and calcium replaced this morning.   Hypoglycemia in the setting of shock and acute liver injury - improved  Hypothyroidism - Continue home medication  Hyponatremia/hypokalemia/hypocalcemia/hypophosphatemia - Continue aggressive electrolyte supplement  History of seizure disorder - Continue home keppra   Best Practice (right click and "Reselect all SmartList Selections" daily)   Diet/type: NPO DVT prophylaxis: SCD GI prophylaxis: PPI Lines: Central line Foley:  Yes, and it is still needed Code Status:  full code Last date of multidisciplinary goals of care discussion [3/5: Updated patient's boyfriend at bedside, explained that prognosis is guarded, has high mortality despite everything we can do.  He would like to continue full scope of care while keeping her full code]  Labs   CBC: Recent Labs  Lab 10/10/21 2358 11/05/2021 0420 10/20/2021 0858 10/24/2021 1431 11/03/2021 1530 10/28/2021 2041 10/12/21 0426  WBC 31.9*   < > 41.7* 10.4 40.3* 42.6* 39.0*  NEUTROABS 23.0*  --   --   --   --   --   --   HGB 9.3*   < > 9.0* 12.3 7.8* 7.5* 7.0*  HCT 26.5*   < > 25.3* 38.4 21.2* 21.1* 19.2*  MCV 96.0   < > 90.0 90.4 88.3 89.8 89.3  PLT 148*   < > 143* 267 141* 149* 127*   < > = values in  this interval not displayed.     Basic Metabolic Panel: Recent Labs  Lab 10/05/21 1137 10/05/21 1523 10/06/21 0519 10/07/21 0254 10/08/21 4854 10/09/21 0131 10/10/21 0824 11/06/2021 0420 10/07/2021 0508 10/12/21 0426  NA 136   < > 135   < > 138 132* 131* 130* 130* 131*  K 3.7   < > 3.5   < > 3.1* 3.2* 3.8 3.4* 3.3* 3.2*  CL 101   < > 100   < > 104 103 100  --  100 101  CO2 21*   < > 24   < > 20* 16* 15*  --  15* 17*  GLUCOSE 111*   < > 103*   < > 87 98 68*  --  107* 97  BUN 10   < > 9   < > 13 18 24*  --  28* 32*  CREATININE 1.14*   < > 1.06*   < > 1.33* 1.56* 1.85*  --  2.33* 2.64*  CALCIUM 8.2*   < > 8.2*   < > 7.5* 7.2* 7.3*  --  6.2* 6.4*  MG 1.8  --  2.1  --   --   --   --   --  1.8 2.0  PHOS  --   --   --   --   --   --   --   --  2.4*  --    < > = values in this interval not displayed.    GFR: Estimated Creatinine Clearance: 24.2 mL/min (A) (by C-G formula based on SCr of 2.64 mg/dL (H)). Recent Labs  Lab 10/06/21 0823 10/06/21 1121 10/08/21 0646 10/09/21 0131 10/29/2021 0508 10/24/2021 0858 10/24/2021 1431 10/29/2021 1530 10/14/2021 2041 10/12/21 0426  WBC  --   --  30.8*   < > 33.5*   < > 10.4 40.3* 42.6* 39.0*  LATICACIDVEN 2.6* 2.6* 2.4*  --  4.3*  --   --   --   --   --    < > = values in this interval not displayed.     Liver Function Tests: Recent Labs  Lab 10/07/21 1427 10/08/21 0646 10/09/21 0131 10/10/21 0824 10/16/2021 0508  AST 370* 387* 406* 355* 241*  ALT 100* 107* 110* 92* 59*  ALKPHOS 145* 152* 206* 189* 119  BILITOT 32.7* 33.8* 34.1* 37.7* 24.7*  PROT 6.0* 5.7* 6.0* 5.7* 4.2*  ALBUMIN 2.1* 1.9* 2.0* 2.7* 2.3*    No results for input(s): LIPASE, AMYLASE in the last 168 hours. No results for input(s): AMMONIA in the last 168 hours.  ABG    Component Value Date/Time   PHART 7.359 10/29/2021 0420   PCO2ART 19.6 (LL) 11/03/2021 0420   PO2ART 90 10/26/2021 0420   HCO3 11.1 (L) 11/04/2021 0420   TCO2 12 (L) 10/10/2021 0420   ACIDBASEDEF  13.0 (H) 11/06/2021 0420   O2SAT 97 11/05/2021 0420      Coagulation Profile: Recent Labs  Lab 10/09/21 0131 10/10/21 0824 10/10/2021 0508 10/29/2021 1431 10/12/2021 2041  INR 2.5* 2.7* 3.1* 2.1* 2.1*     Cardiac Enzymes: No results for input(s): CKTOTAL, CKMB, CKMBINDEX, TROPONINI in the last 168 hours.  HbA1C: Hgb A1c MFr Bld  Date/Time Value Ref Range Status  07/18/2020 01:27 PM 5.1 4.8 - 5.6 % Final    Comment:    (NOTE) Pre diabetes:          5.7%-6.4%  Diabetes:              >6.4%  Glycemic control for   <7.0% adults with diabetes     CBG: Recent Labs  Lab 11/04/2021 1126 10/07/2021 1555 10/10/2021 1938 10/14/2021 2316 10/12/21 0343  GLUCAP 119* 120* 111* 100* 94     Critical care time: 44 min required due to hemorrhagic vs septic shock, liver failure     Georgann Housekeeper, AGACNP-BC [facility]  See Amion for personal pager PCCM on call pager [phone] until 7pm. Please call Elink 7p-7a. [phone]  10/12/2021 7:58 AM

## 2021-10-12 NOTE — Consult Note (Signed)
WOC Nurse Consult Note: ?Reason for Consult:Two areas of irritant contact dermatitis related to incontinence of urine and stool to the lower buttocks ?Wound type:IAD, ICD ? ?ICD-10 CM Codes for Irritant Dermatitis ?Q42J0 - Due to fecal, urinary or dual incontinence ? ?Pressure Injury POA: N/A ?Measurement: 7cm x 3cm x 0.1cm area of partial thickness skin loss on bilateral buttocks. ?Wound bed:red, dry ?Drainage (amount, consistency, odor) none ?Periwound: intact, jaundiced ?Dressing procedure/placement/frequency:Patient is on a mattress replacement with low air loss feature and is being turned from side to side. Time in the supine position is minimized. I will provide Nursing with zinc oxide as a skin barrier (Desitin). ? ?[facility] nursing team will not follow, but will remain available to this patient, the nursing and medical teams.  Please re-consult if needed. ?Thanks, ?Maudie Flakes, MSN, RN, [place], [place], CWON-AP, [place]  ?Pager# [phone]   ? ? ? ?  ?

## 2021-10-12 NOTE — H&P (View-Only) (Signed)
Daily Rounding Note  10/12/2021, 8:08 AM  LOS: 9 days   SUBJECTIVE:   Chief complaint:    severe ETOH hepatitis.  GI bleed.    Points to upper abdomen, RUQ as source of pain.  Denies N/V RN reports red blood surrounding brown stool x 2 this AM.    OBJECTIVE:         Vital signs in last 24 hours:    Temp:  [97.3 F (36.3 C)-98.6 F (37 C)] 97.7 F (36.5 C) (03/06 0732) Pulse Rate:  [74-91] 83 (03/06 0600) Resp:  [14-25] 18 (03/06 0600) BP: (69-128)/(37-63) 128/50 (03/06 0400) SpO2:  [97 %-100 %] 100 % (03/06 0600) Arterial Line BP: (86-124)/(42-66) 102/48 (03/06 0600) Last BM Date : 10/22/2021 Filed Weights   10/04/21 0337 11/01/2021 0255  Weight: 63.7 kg 78.1 kg   General: severely jaundiced but alert, comfortable.   Heart: RRR Chest: clear in front.  No dyspnea or cough. Abdomen: soft, minimal if any tenderness RUQ.   BS hypoactive.  No distention.  Areas of bruising on lower abdomen and more extensive L groin into thigh Extremities: No CCE Neuro/Psych:  oriented x 3.  Moves all 4 limbs.  No asterixis though hand flap could have been subtle/missed in setting of safety mittens.    Intake/Output from previous day: 03/05 0701 - 03/06 0700 In: 2237.3 [I.V.:484.8; Blood:654.8; NG/GT:100; IV Piggyback:997.8] Out: 230 [Urine:230]  Intake/Output this shift: No intake/output data recorded.  Lab Results: Recent Labs    10/16/2021 1530 10/22/2021 2041 10/12/21 0426  WBC 40.3* 42.6* 39.0*  HGB 7.8* 7.5* 7.0*  HCT 21.2* 21.1* 19.2*  PLT 141* 149* 127*   BMET Recent Labs    10/10/21 0824 11/05/2021 0420 10/15/2021 0508 10/12/21 0426  NA 131* 130* 130* 131*  K 3.8 3.4* 3.3* 3.2*  CL 100  --  100 101  CO2 15*  --  15* 17*  GLUCOSE 68*  --  107* 97  BUN 24*  --  28* 32*  CREATININE 1.85*  --  2.33* 2.64*  CALCIUM 7.3*  --  6.2* 6.4*   LFT Recent Labs    10/10/21 0824 11/06/2021 0508  PROT 5.7* 4.2*  ALBUMIN 2.7*  2.3*  AST 355* 241*  ALT 92* 59*  ALKPHOS 189* 119  BILITOT 37.7* 24.7*   PT/INR Recent Labs    10/12/2021 1431 10/22/2021 2041  LABPROT 23.8* 23.4*  INR 2.1* 2.1*   Hepatitis Panel No results for input(s): HEPBSAG, HCVAB, HEPAIGM, HEPBIGM in the last 72 hours.  Studies/Results: NM GI Blood Loss  Result Date: 10/12/2021 CLINICAL DATA:  GI hemorrhage for 1 day with negative CTA EXAM: NUCLEAR MEDICINE GASTROINTESTINAL BLEEDING SCAN TECHNIQUE: Sequential abdominal images were obtained following intravenous administration of Tc-66m labeled red blood cells. RADIOPHARMACEUTICALS:  21.4 mCi Tc-97m pertechnetate in-vitro labeled red cells. COMPARISON:  CTA from earlier in the same day. FINDINGS: Injection is noted via a left femoral central line. Normal vascular activity is identified. Minimal pooling is noted in the urinary bladder. No focal area of increased activity with mobility is noted to suggest acute GI hemorrhage. IMPRESSION: No findings to suggest acute GI hemorrhage. Electronically Signed   By: Inez Catalina M.D.   On: 11/05/2021 20:02   MR LIVER WO CONRTAST  Result Date: 10/10/2021 CLINICAL DATA:  Liver failure, alcoholic hepatitis EXAM: MRI ABDOMEN WITHOUT CONTRAST TECHNIQUE: Multiplanar multisequence MR imaging was performed without the administration of intravenous contrast. Patient declined to  complete the full examination, limited sequences provided. COMPARISON:  CT abdomen pelvis, 10/09/2021 FINDINGS: Incomplete examination provided for review is further significantly limited by breath motion artifact throughout. Lower chest: Small bilateral pleural effusions.  Cardiomegaly. Hepatobiliary: Severe hepatomegaly. No obvious mass or other discrete abnormality of the liver parenchyma. Distended gallbladder again noted, likely containing sludge and tiny gallstones. No biliary ductal dilatation. Pancreas: No mass, inflammatory changes, or other parenchymal abnormality identified.No pancreatic  ductal dilatation. Spleen:  Within normal limits in size and appearance. Adrenals/Urinary Tract: Normal adrenal glands. 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 No renal masses or suspicious contrast enhancement identified. No evidence of hydronephrosis. Stomach/Bowel: Visualized portions within the abdomen are unremarkable. Vascular/Lymphatic: No pathologically enlarged lymph nodes identified. No abdominal aortic aneurysm demonstrated. Other:  Anasarca.  Small volume ascites throughout the abdomen. Musculoskeletal: No suspicious osseous lesions identified. IMPRESSION: 1. Incomplete examination provided for review is further significantly limited by breath motion artifact throughout. 2. Within this limitation, severe hepatomegaly. No obvious mass or other discrete abnormality of the liver parenchyma on noncontrast MR. 3. Distended gallbladder again noted, likely containing sludge and tiny gallstones. No biliary ductal dilatation. 4. Small volume ascites, pleural effusions, and anasarca. Electronically Signed   By: Delanna Ahmadi M.D.   On: 10/10/2021 13:22   DG CHEST PORT 1 VIEW  Result Date: 10/12/2021 CLINICAL DATA:  Encounter for nasogastric (NG) tube placement Z46.59 (ICD-10-CM) EXAM: PORTABLE CHEST 1 VIEW COMPARISON:  October 11, 2021. FINDINGS: Is a gastric tube courses below the diaphragm with the tip in the expected region the stomach. Side port is below the GE junction. Similar appearance of low lung volumes with bibasilar opacities. No visible pleural effusions or pneumothorax. Similar cardiomediastinal silhouette, accentuated by AP technique and low lung volumes. IMPRESSION: 1. Nasogastric tube in the stomach with side port below the GE junction. 2. Similar low lung volumes and bibasilar opacities, most likely atelectasis. Electronically Signed   By: Margaretha Sheffield M.D.   On: 10/12/2021 07:18   DG CHEST PORT 1 VIEW  Result Date:  10/09/2021 CLINICAL DATA:  Nasogastric catheter placement EXAM: PORTABLE CHEST 1 VIEW COMPARISON:  09/23/2021 FINDINGS: Cardiac shadow is stable. Gastric catheter is noted extending into the stomach. Mild bibasilar atelectatic changes are noted new from the prior exam. No acute bony abnormality is noted. IMPRESSION: Gastric catheter within the stomach. Bibasilar atelectasis is noted. Electronically Signed   By: Inez Catalina M.D.   On: 10/24/2021 22:17   CT ANGIO GI BLEED  Result Date: 10/15/2021 CLINICAL DATA:  61 year old female with blood and blood clots per rectum. History of liver failure. Alcoholic hepatitis. EXAM: CTA ABDOMEN AND PELVIS WITHOUT AND WITH CONTRAST TECHNIQUE: Multidetector CT imaging of the abdomen and pelvis was performed using the standard protocol during bolus administration of intravenous contrast. Multiplanar reconstructed images and MIPs were obtained and reviewed to evaluate the vascular anatomy. RADIATION DOSE REDUCTION: This exam was performed according to the departmental dose-optimization program which includes automated exposure control, adjustment of the mA and/or kV according to patient size and/or use of iterative reconstruction technique. CONTRAST:  132mL OMNIPAQUE IOHEXOL 350 MG/ML SOLN COMPARISON:  Recent Abdomen MRI 10/10/2021, CT Abdomen 10/09/2021 FINDINGS: VASCULAR Normal abdominal aorta and its major branches. Minimal atherosclerosis at the aortoiliac bifurcation. Patent bilateral iliac arteries. No arterial aneurysm or dissection. Left femoral approach venous catheter tip terminates at the confluence of the left internal and external iliac veins. Patent portal venous system with  mass effect on the intrahepatic portal veins and hepatic veins as demonstrated 2 days ago. Normal caliber IVC. Review of the MIP images confirms the above findings. NON-VASCULAR Lower chest: Stable small layering pleural effusions and lung base atelectasis. No pericardial effusion. Hepatobiliary:  Severely abnormal liver, hepatomegaly, heterogeneous steatosis and enhancement unchanged from the recent CT and MRI. Massively distended gallbladder, volume estimated at 200 mL. Some gallbladder vicarious contrast excretion suspected. No definite pericholecystic fluid. Pancreas: Indistinct pancreas with small volume ascites in the lesser sac appears stable since 10/09/2021. Acute pancreatitis would be difficult to exclude. No pancreatic ductal enlargement. No pancreatic atrophy. Spleen: No splenomegaly. Small volume perisplenic ascites. Adrenals/Urinary Tract: Nonobstructed kidneys, but with retention of contrast, ongoing contrast excretion on the initial noncontrast phase images. No hydroureter. Bladder decompressed by Foley catheter, with small volume of excreted IV contrast in the bladder. Incidental pelvic phleboliths. Right renal benign angiomyolipoma again noted. Stomach/Bowel: Featureless large and small bowel loops throughout the abdomen. Intermittent gas, retained stool, and also fluid in the large bowel. Flocculated stool type material also in the distal small bowel. Following IV contrast no extravasation into the bowel lumen is identified on the early arterial or on the delayed images. Enteric tube terminates in the distal stomach. No free air. Lymphatic: No lymphadenopathy. Reproductive: Multiple uterine fibroids, some subserosal and pedunculated. Individual fibroids up to 2 cm. Some calcified. Other: Streak artifact. But ascites in the lower abdomen and pelvis now appears mildly complex, as opposed to simple fluid density 2 days ago. Volume of fluid is fairly small. Musculoskeletal: No acute osseous abnormality identified. Anasarca. IMPRESSION: VASCULAR 1. No contrast extravasation identified within the bowel lumen. 2. Normal abdominal and pelvic arteries with minimal atherosclerosis. 3. Mass effect on the intrahepatic portal veins and hepatic veins related to abnormal liver. NON-VASCULAR 1. Anasarca,  with stable small layering pleural effusions. Small volume ascites which now has mildly complex fluid density - new from two days ago. Consider peritonitis. Hemoperitoneum felt less likely. 2. Severe abnormal Liver, unchanged. Gallbladder Hydrops (200 mL gallbladder). 3. Delayed bilateral nephrograms suggesting acute intrinsic renal disease / acute renal insufficiency. No obstructive uropathy. 4. Indistinct pancreas stable since 10/09/2021. Difficult to exclude Acute Pancreatitis. 5. Featureless large and small bowel loops with no bowel obstruction or discrete bowel inflammation identified. No free air. Enteric tube terminates in the distal stomach. Electronically Signed   By: Genevie Ann M.D.   On: 10/12/2021 05:28    Scheduled Meds:  chlorhexidine  15 mL Mouth Rinse BID   Chlorhexidine Gluconate Cloth  6 each Topical Daily   folic acid  1 mg Per Tube Daily   lactulose  20 g Per Tube TID   levETIRAcetam  500 mg Per Tube BID   levothyroxine  75 mcg Per Tube Q0600   mouth rinse  15 mL Mouth Rinse q12n4p   multivitamin with minerals  1 tablet Per Tube Daily   pantoprazole (PROTONIX) IV  40 mg Intravenous Q12H   rifaximin  550 mg Per Tube BID   sodium bicarbonate  1,300 mg Per Tube BID   thiamine  100 mg Per Tube Daily   Continuous Infusions:  sodium chloride Stopped (10/18/2021 0555)   norepinephrine (LEVOPHED) Adult infusion 1 mcg/min (10/12/21 0500)   octreotide  (SANDOSTATIN)    IV infusion 50 mcg/hr (10/12/21 0500)   piperacillin-tazobactam (ZOSYN)  IV 3.375 g (10/12/21 0601)   potassium chloride 10 mEq (10/12/21 0729)   PRN Meds:.sodium chloride, HYDROmorphone (DILAUDID) injection, hydrOXYzine, ondansetron (ZOFRAN)  IV   ASSESMENT:   Rectal bleeding, hematochezia starting ~ 3/4.    . 10/23/2021 flex sig: Blood in rectum and sigmoid with no obvious source for bleeding despite lavage. 10/28/2021 CTA w/o active bleed.  Mass effect on intrahepatic portal veins and hepatic veins related to parenchymal  liver changes.  Anasarca, small ascites which now has mildly complex fluid density new from 2 days ago, consider peritonitis, hemoperitoneum less likely.  Severe liver changes.  Gallbladder hydrops.  Delayed nephrogram suggest acute renal insufficiency.  Indistinct pancreas, stable, difficult to exclude pancreatitis.  Featureless small bowel and colon. 10/10/2021 NM bleeding scan: no active bleeding.   Protonix switched from po to IV yesterday.    ETOH hepatitis.  Severe.  Prednisolone 2/26 - 3/3, unresponsive.  Also has H63D mutation for hereditary hemochromatosis. Ultrasound 2/25: sludge, gallbladder wall thickening, 8.1 mm CBD, fatty liver, reversed flow through portal vein.  Liver Dopplers show hepatofugal flow in right and main portal vein preserved antegrade flow in left portal vein.  Patent splenic and superior mesenteric veins.  Empiric Zosyn initiated. For concern of cholecystitis CT abdomen 3/3: Marked hepatomegaly, nodularity, diffusely decreased liver attenuation significantly change since mid December.  Portal and hepatic venous anatomy show areas of mass effect but not markedly distorted overall this may reflect progression of fatty liver but given nodularity cirrhosis, mets or other infiltrative process not excluded.  MRI recommended.  Massively distended gallbladder and tiny calcified gallstone.  Small volume ascites.  Collapse, consolidative disease in both lungs with small pleural effusions. MR liver 3/4: Incomplete exam due to impact of respiratory artifact.  But reveals severe hepatomegaly without liver masses.  Gallbladder distended likely contains sludge tiny gallstones.  Bile ducts normal.  Small amount of ascites, pleural effusions, anasarca. T bili, Alk phos, Transaminases better (still T bili 24.7). Octreotide since 3/5 Surgery last saw 2/27.  Suggested 7-day antibiotics for possible cholecystitis.  Day 9 Zosyn.  Did not recommend surgery. GI bleed interrupted planned  cholecystostomy tube 3/5     Blood loss anemia.  4 PRBCs in previous 2 d.      AKI.  GFR >60 ... 20.  Urine output 55... 230 mL previous 2 d.  Urine Na   Coagulopathy.INR 3.1... 2.1.  FFP x 2 on 3/5.  Previous Vit K IV and po.  Repeat INR pndg.      Groin Hematoma at site L femoral central line.    HE.  Rifaximin and lactulose in place.    Metabolic acidosis.   Thrombocytopenia.      Hyponatremia.  Stable ~ 131.      Hypocalcemia.     PLAN     Ask for renal guidance?  Midodrine?.      Perc cholecystostomy tube for today.  CCM may need to give additional FFP depending on INR,     Mentally seems ok for po but NGT is still in place (has been "clamped" for several hours between studies and self removing tubes x 2.       EGD tmrw.  Freeborn for clears today if NGT removed. NPO after midnight.       Pt and sign other are interested in getting him assigned as legal HCPOA.  She is estranged from blood relations.      Azucena Freed  10/12/2021, 8:08 AM Phone [phone]

## 2021-10-12 NOTE — Progress Notes (Addendum)
eLink Physician-Brief Progress Note ?Patient Name: Judy Meyer ?DOB: 01/16/1961 ?MRN: [id number] ? ? ?Date of Service ? 10/12/2021  ?HPI/Events of Note ? Coagulopathy - INR = 2.1. Patient has only received 1 of 2 units FFP ordered prior.  ?eICU Interventions ? Will transfuse FFP 1 units now.   ? ? ? ?Intervention Category ?Major Interventions: Other: ? ?Jerric Oyen Cornelia Copa ?10/12/2021, 9:56 PM ?

## 2021-10-12 NOTE — Progress Notes (Signed)
eLink Physician-Brief Progress Note ?Patient Name: Judy Meyer ?DOB: [birth year] ?MRN: [id number] ? ? ?Date of Service ? 10/12/2021  ?HPI/Events of Note ? K+ 3.2, Calcium 6.4, creatinine 2.6 ?Patient got IV Kcl, IV calcium as well as IV K phos yesterday AM  ?eICU Interventions ? 20 meq IV Kcl ?2 gram IV calcium (corrected level is 7.7) ?Recheck K at 11 am   ? ? ? ?Intervention Category ?Major Interventions: Electrolyte abnormality - evaluation and management ? ?Lovenia Kim Kamaury Cutbirth ?10/12/2021, 5:54 AM ?

## 2021-10-12 NOTE — Progress Notes (Signed)
Date and time results received: 10/12/21 5:41 AM ? ?Test: Ca 6.4 / Hgb 7 ? ?Name of Provider Notified: Elink MD ? ?Orders Received? Or Actions Taken?: Orders Received - See Orders for details and repeat CBC in 6hrs ?

## 2021-10-12 NOTE — Progress Notes (Signed)
Occupational Therapy Treatment ?Patient Details ?Name: Judy Meyer ?MRN: [id number] ?DOB: 05-06-1961 ?Today's Date: 10/12/2021 ? ? ?History of present illness Judy Meyer is a 61 y.o. admitted 2/25 who presented with worsening of her jaundice, nausea, vomiting and admitted for acute alcoholic hepatitis.  On 3/4, pt with hypotension and lower GI bleed, transferred to ICU. Initially planned for percutaneous cholecystostomy tube placement on 3/5 but placed on hold due to bleeding. L groin hematoma noted after femoral central line placed on 3/5. PMH of alcohol associated hepatitis, hypothyroidism, hypertension, seizures, anemia, hyperlipidemia and alcohol use disorder ?  ?OT comments ? Pt seen for first OT session since ICU transfer. Pt limited by pain at central fem/hematoma site with difficulty tolerating hip flexion so did not progress EOB/OOB this AM. Focus on improving tolerance to postural changes with HOB elevation, bed mobility techniques at Min A and bed level ADLs. Encouraged gentle ROM of L LE within tolerance to decrease stiffness. Noted AIR denial d/t no 24/7 support at DC, so updating DC recs to SNF rehab as pt continues to require physical assist for safe ADL/mobility completion.  ? ?Recommendations for follow up therapy are one component of a multi-disciplinary discharge planning process, led by the attending physician.  Recommendations may be updated based on patient status, additional functional criteria and insurance authorization. ?   ?Follow Up Recommendations ? Skilled nursing-short term rehab (<3 hours/day) (noted AIR signed off d/t no 24/7 support at DC)  ?  ?Assistance Recommended at Discharge Frequent or constant Supervision/Assistance  ?Patient can return home with the following ? A lot of help with bathing/dressing/bathroom;Assistance with cooking/housework;Direct supervision/assist for medications management;Direct supervision/assist for financial management;Assist for transportation ?   ?Equipment Recommendations ? BSC/3in1  ?  ?Recommendations for Other Services   ? ?  ?Precautions / Restrictions Precautions ?Precautions: Fall ?Precaution Comments: watch BP, NGT ?Restrictions ?Weight Bearing Restrictions: No  ? ? ?  ? ?Mobility Bed Mobility ?Overal bed mobility: Needs Assistance ?  ?  ?  ?  ?  ?  ?General bed mobility comments: Deferred EOB due to fem line/hematoma pain. able to scoot self up in bed with bedrails with Min A; cues to use R LE to push self up with slower motor planning noted ?  ? ?Transfers ?  ?  ?  ?  ?  ?  ?  ?  ?  ?General transfer comment: deferred due to central fem line and hematoma pain ?  ?  ?Balance   ?  ?  ?  ?  ?  ?  ?  ?  ?  ?  ?  ?  ?  ?  ?  ?  ?  ?  ?   ? ?ADL either performed or assessed with clinical judgement  ? ?ADL Overall ADL's : Needs assistance/impaired ?Eating/Feeding: NPO ?  ?Grooming: Set up;Bed level;Wash/dry face ?  ?  ?  ?  ?  ?  ?  ?  ?  ?  ?  ?  ?  ?  ?  ?  ?General ADL Comments: Noted hematoma spreading from initial chart picture at femoral line; increased pain and drainage noted with pain with knee flexion. limited to bed level tasks but encouraged pt to range L LE gently and increase HOB elevation for postural BP improvements and tolerance to hip flexion for sitting EOB/up in chair in next session ?  ? ?Extremity/Trunk Assessment Upper Extremity Assessment ?Upper Extremity Assessment: Generalized weakness ?  ?Lower Extremity Assessment ?  Lower Extremity Assessment: Defer to PT evaluation ?  ?  ?  ? ?Vision   ?Vision Assessment?: No apparent visual deficits ?  ?Perception   ?  ?Praxis   ?  ? ?Cognition Arousal/Alertness: Awake/alert ?Behavior During Therapy: Flat affect ?Overall Cognitive Status: No family/caregiver present to determine baseline cognitive functioning ?  ?  ?  ?  ?  ?  ?  ?  ?  ?  ?  ?  ?  ?  ?  ?  ?General Comments: follows directions consistently, some decreased awareness of deficits, reason for NPO, medical complexities. Slower  motor planning ?  ?  ?   ?Exercises   ? ?  ?Shoulder Instructions   ? ? ?  ?General Comments VSS on RA  ? ? ?Pertinent Vitals/ Pain       Pain Assessment ?Pain Assessment: Faces ?Faces Pain Scale: Hurts even more ?Pain Location: abdomen, L groin hematoma ?Pain Descriptors / Indicators: Guarding, Grimacing, Sore ?Pain Intervention(s): Monitored during session, Limited activity within patient's tolerance ? ?Home Living   ?  ?  ?  ?  ?  ?  ?  ?  ?  ?  ?  ?  ?  ?  ?  ?  ?  ?  ? ?  ?Prior Functioning/Environment    ?  ?  ?  ?   ? ?Frequency ? Min 2X/week  ? ? ? ? ?  ?Progress Toward Goals ? ?OT Goals(current goals can now be found in the care plan section) ? Progress towards OT goals: OT to reassess next treatment ? ?Acute Rehab OT Goals ?Patient Stated Goal: get some ice chips ?OT Goal Formulation: With patient ?Time For Goal Achievement: 10/22/21 ?Potential to Achieve Goals: Good ?ADL Goals ?Pt Will Perform Lower Body Dressing: sit to/from stand;sitting/lateral leans;with supervision ?Pt Will Transfer to Toilet: with supervision;ambulating ?Pt Will Perform Tub/Shower Transfer: Tub transfer;with supervision;ambulating ?Pt/caregiver will Perform Home Exercise Program: Increased strength;Both right and left upper extremity;With theraband;Independently;With written HEP provided ?Additional ADL Goal #1: Pt to increase standing activity tolerance > 7 min during functional tasks to maximize overall endurance.  ?Plan Discharge plan needs to be updated   ? ?Co-evaluation ? ? ?   ?  ?  ?  ?  ? ?  ?AM-PAC OT "6 Clicks" Daily Activity     ?Outcome Measure ? ? Help from another person eating meals?: A Little ?Help from another person taking care of personal grooming?: A Little ?Help from another person toileting, which includes using toliet, bedpan, or urinal?: A Lot ?Help from another person bathing (including washing, rinsing, drying)?: A Lot ?Help from another person to put on and taking off regular upper body clothing?: A  Little ?Help from another person to put on and taking off regular lower body clothing?: A Lot ?6 Click Score: 15 ? ?  ?End of Session   ? ?OT Visit Diagnosis: Unsteadiness on feet (R26.81);Other abnormalities of gait and mobility (R26.89);Muscle weakness (generalized) (M62.81) ?  ?Activity Tolerance Patient limited by fatigue;Patient limited by pain ?  ?Patient Left in bed;with call bell/phone within reach;with bed alarm set ?  ?Nurse Communication Mobility status (mitts, hematoma at hip) ?  ? ?   ? ?Time: [phone] ?OT Time Calculation (min): 30 min ? ?Charges: OT General Charges ?$OT Visit: 1 Visit ?OT Treatments ?$Self Care/Home Management : 8-22 mins ?$Therapeutic Activity: 8-22 mins ? ?Malachy Chamber, OTR/L ?Acute Rehab Services ?Office: [phone]  ? ?Layla Maw ?10/12/2021, 8:42  AM ?

## 2021-10-12 NOTE — Progress Notes (Signed)
PCCM INTERVAL PROGRESS NOTE ? ? ? ?PM labs reviewed. Hemoglobin 6. Will transfuse one unit PRBC.  ? ? ? ? ?Georgann Housekeeper, AGACNP-BC ?[facility] ? ?See Amion for personal pager ?PCCM on call pager [phone] until 7pm. ?Please call Elink 7p-7a. [phone] ? ?10/12/2021 5:47 PM ? ? ? ? ?

## 2021-10-12 NOTE — Progress Notes (Signed)
?                                                   ?Palliative Care Progress Note, Assessment & Plan  ? ?Patient Name: Judy Meyer       Date: 10/12/2021 ?DOB: 04/07/1961  Age: 61 y.o. MRN#: [id number] ?Attending Physician: Julian Hy, DO ?Primary Care Physician: Pcp, No ?Admit Date: 09/30/2021 ? ?Reason for Consultation/Follow-up: Establishing goals of care ? ?Subjective: ?Patient is lying in bed in no apparent distress.  She acknowledges my presence and appears to be able to make her needs and wishes known.  She is sleepy but easily arousable.  Her significant other Judy Meyer is at bedside. ? ?HPI: ?61 y.o. female  with past medical history of Etoh abuse, HTN, hypothyroidism, liver disease, and history of seizures admitted on 10/04/2021 with acute liver failure d/t alcoholic hepatitis and acute cholecystitis.  ?  ?3/4, pt had red blood from rectum with hypotension. CCM and GI were consulted and pt transferred to ICU. Pt has received total 3 unit PRBCs. Flex sig and further imaging did not reveal source of bleeding.  ? ?3/5, pt receiving 2 units FFP, percutaneous cholecystostomy tube placement planned for today, and EGD planned for tomorrow.  ? ?Summary of counseling/coordination of care: ?I was contacted by Dr. Carlis Abbott in regards to patient's decision maker. Pt has no documented HCPOA so next of kin would be her mother, whom patient is estranged from according to [place]. As per Dr. Ainsley Spinner evaluation, patient is alert and oriented and has capacity to make decisions. As such, I placed a spiritual care consult for ACP documentation and Chaplain Dorian Pod completed HCPOA to designate Judy Meyer as HCPOA. ? ?After reviewing the patient's chart and assessing the patient at bedside, I met with patient and her significant other/HCPOA Judy Meyer at bedside.  Discussed that patient had no significant events  overnight.  Judy Meyer is hopeful that this percutaneous drain can be placed to give her some relief.  He is apporpriately tearful and concerned about her.  Therapeutic silence and active listening provided for him to share his thoughts and emotions regarding her current health status. ? ?Patient opened her eyes during our discussions but did not participate with any verbalizations. Reviewed with Judy Meyer that the medical team continues to treat the treatable and full code remains. He is hopeful that the placement of the percutaneous cholecystostomy tube today will provide her with some relief. Emotional support given.  ? ?Questions and concerns were addressed. PMT will continue to follow the patient throughout her hospitalization.  ? ?Code Status: ?Full code ? ?Prognosis: ?Unable to determine ? ?Discharge Planning: ?To Be Determined ? ?Care plan was discussed with patient, patient's HCPOA Judy Meyer ? ?Physical Exam ?Vitals and nursing note reviewed.  ?HENT:  ?   Head: Normocephalic and atraumatic.  ?   Mouth/Throat:  ?   Comments: NG in place ?Cardiovascular:  ?   Rate and Rhythm: Normal rate.  ?Pulmonary:  ?   Effort: Pulmonary effort is normal.  ?Abdominal:  ?   General: There is distension.  ?Skin: ?   Coloration: Skin is jaundiced.  ?Neurological:  ?   Mental Status: She is alert and oriented to person, place, and time.  ?Psychiatric:     ?   Mood and Affect: Mood normal. Mood is not anxious.     ?  Behavior: Behavior normal.  ?         ? ?Palliative Assessment/Data: 50% ? ? ? ?Total Time 25 minutes  ?Greater than 50%  of this time was spent counseling and coordinating care related to the above assessment and plan. ? ?Thank you for allowing the Palliative Medicine Team to assist in the care of this patient. ? ?Verdell Carmine. Natalin Bible, DNP, FNP-BC ?Palliative Medicine Team ?Team Phone # [phone] ?  ?

## 2021-10-12 NOTE — Progress Notes (Signed)
This chaplain responded to PMT NP-Kathyrn's referral for completing the Pt. Advance Directive-HCPOA, alongside the consult for spiritual care. The Pt. significant other-[name] is at the bedside during the AD education and clarifying questions. ? ?The chaplain joined the Pt., [name], notary, and two witnesses for the notarizing of the Pt. Advance Directive: HCPOA. ? ?The Pt. named Judy Meyer as her HCPOA. ? ?The chaplain gave the original AD to the Pt. along with one copy. The chaplain scanned a copy of the Pt. AD into the Pt. EMR. ? ?This chaplain began rapport building with the Pt. and [name]. The Pt. describes herself as a spiritual person with the hope of feeling better. The Pt. shared she is "tired". The Pt and [name] accepted the chaplain's invitation for rest and F/U spiritual care. ? ?Chaplain Sallyanne Kuster ?[phone] ?

## 2021-10-12 NOTE — Procedures (Signed)
?  Procedure: Percutaneous cholecystostomy catheter placement 3f?EBL:   minimal ?Complications:  none immediate ? ?[facility] ? ?D. DArne ClevelandMD ?Main # [phone]?Pager  [phone] ?Mobile [phone] ?  ? ?

## 2021-10-12 NOTE — Progress Notes (Addendum)
Daily Rounding Note  10/12/2021, 8:08 AM  LOS: 9 days   SUBJECTIVE:   Chief complaint:    severe ETOH hepatitis.  GI bleed.    Points to upper abdomen, RUQ as source of pain.  Denies N/V RN reports red blood surrounding brown stool x 2 this AM.    OBJECTIVE:         Vital signs in last 24 hours:    Temp:  [97.3 F (36.3 C)-98.6 F (37 C)] 97.7 F (36.5 C) (03/06 0732) Pulse Rate:  [74-91] 83 (03/06 0600) Resp:  [14-25] 18 (03/06 0600) BP: (69-128)/(37-63) 128/50 (03/06 0400) SpO2:  [97 %-100 %] 100 % (03/06 0600) Arterial Line BP: (86-124)/(42-66) 102/48 (03/06 0600) Last BM Date : 10/19/2021 Filed Weights   10/04/21 0337 10/25/2021 0255  Weight: 63.7 kg 78.1 kg   General: severely jaundiced but alert, comfortable.   Heart: RRR Chest: clear in front.  No dyspnea or cough. Abdomen: soft, minimal if any tenderness RUQ.   BS hypoactive.  No distention.  Areas of bruising on lower abdomen and more extensive L groin into thigh Extremities: No CCE Neuro/Psych:  oriented x 3.  Moves all 4 limbs.  No asterixis though hand flap could have been subtle/missed in setting of safety mittens.    Intake/Output from previous day: 03/05 0701 - 03/06 0700 In: 2237.3 [I.V.:484.8; Blood:654.8; NG/GT:100; IV Piggyback:997.8] Out: 230 [Urine:230]  Intake/Output this shift: No intake/output data recorded.  Lab Results: Recent Labs    10/19/2021 1530 10/24/2021 2041 10/12/21 0426  WBC 40.3* 42.6* 39.0*  HGB 7.8* 7.5* 7.0*  HCT 21.2* 21.1* 19.2*  PLT 141* 149* 127*   BMET Recent Labs    10/10/21 0824 10/30/2021 0420 10/30/2021 0508 10/12/21 0426  NA 131* 130* 130* 131*  K 3.8 3.4* 3.3* 3.2*  CL 100  --  100 101  CO2 15*  --  15* 17*  GLUCOSE 68*  --  107* 97  BUN 24*  --  28* 32*  CREATININE 1.85*  --  2.33* 2.64*  CALCIUM 7.3*  --  6.2* 6.4*   LFT Recent Labs    10/10/21 0824 10/12/2021 0508  PROT 5.7* 4.2*  ALBUMIN 2.7*  2.3*  AST 355* 241*  ALT 92* 59*  ALKPHOS 189* 119  BILITOT 37.7* 24.7*   PT/INR Recent Labs    11/03/2021 1431 10/18/2021 2041  LABPROT 23.8* 23.4*  INR 2.1* 2.1*   Hepatitis Panel No results for input(s): HEPBSAG, HCVAB, HEPAIGM, HEPBIGM in the last 72 hours.  Studies/Results: NM GI Blood Loss  Result Date: 10/30/2021 CLINICAL DATA:  GI hemorrhage for 1 day with negative CTA EXAM: NUCLEAR MEDICINE GASTROINTESTINAL BLEEDING SCAN TECHNIQUE: Sequential abdominal images were obtained following intravenous administration of Tc-23m labeled red blood cells. RADIOPHARMACEUTICALS:  21.4 mCi Tc-55m pertechnetate in-vitro labeled red cells. COMPARISON:  CTA from earlier in the same day. FINDINGS: Injection is noted via a left femoral central line. Normal vascular activity is identified. Minimal pooling is noted in the urinary bladder. No focal area of increased activity with mobility is noted to suggest acute GI hemorrhage. IMPRESSION: No findings to suggest acute GI hemorrhage. Electronically Signed   By: Inez Catalina M.D.   On: 10/24/2021 20:02   MR LIVER WO CONRTAST  Result Date: 10/10/2021 CLINICAL DATA:  Liver failure, alcoholic hepatitis EXAM: MRI ABDOMEN WITHOUT CONTRAST TECHNIQUE: Multiplanar multisequence MR imaging was performed without the administration of intravenous contrast. Patient declined to  complete the full examination, limited sequences provided. COMPARISON:  CT abdomen pelvis, 10/09/2021 FINDINGS: Incomplete examination provided for review is further significantly limited by breath motion artifact throughout. Lower chest: Small bilateral pleural effusions.  Cardiomegaly. Hepatobiliary: Severe hepatomegaly. No obvious mass or other discrete abnormality of the liver parenchyma. Distended gallbladder again noted, likely containing sludge and tiny gallstones. No biliary ductal dilatation. Pancreas: No mass, inflammatory changes, or other parenchymal abnormality identified.No pancreatic  ductal dilatation. Spleen:  Within normal limits in size and appearance. Adrenals/Urinary Tract: Normal adrenal glands. 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 No renal masses or suspicious contrast enhancement identified. No evidence of hydronephrosis. Stomach/Bowel: Visualized portions within the abdomen are unremarkable. Vascular/Lymphatic: No pathologically enlarged lymph nodes identified. No abdominal aortic aneurysm demonstrated. Other:  Anasarca.  Small volume ascites throughout the abdomen. Musculoskeletal: No suspicious osseous lesions identified. IMPRESSION: 1. Incomplete examination provided for review is further significantly limited by breath motion artifact throughout. 2. Within this limitation, severe hepatomegaly. No obvious mass or other discrete abnormality of the liver parenchyma on noncontrast MR. 3. Distended gallbladder again noted, likely containing sludge and tiny gallstones. No biliary ductal dilatation. 4. Small volume ascites, pleural effusions, and anasarca. Electronically Signed   By: Delanna Ahmadi M.D.   On: 10/10/2021 13:22   DG CHEST PORT 1 VIEW  Result Date: 10/12/2021 CLINICAL DATA:  Encounter for nasogastric (NG) tube placement Z46.59 (ICD-10-CM) EXAM: PORTABLE CHEST 1 VIEW COMPARISON:  October 11, 2021. FINDINGS: Is a gastric tube courses below the diaphragm with the tip in the expected region the stomach. Side port is below the GE junction. Similar appearance of low lung volumes with bibasilar opacities. No visible pleural effusions or pneumothorax. Similar cardiomediastinal silhouette, accentuated by AP technique and low lung volumes. IMPRESSION: 1. Nasogastric tube in the stomach with side port below the GE junction. 2. Similar low lung volumes and bibasilar opacities, most likely atelectasis. Electronically Signed   By: Margaretha Sheffield M.D.   On: 10/12/2021 07:18   DG CHEST PORT 1 VIEW  Result Date:  11/05/2021 CLINICAL DATA:  Nasogastric catheter placement EXAM: PORTABLE CHEST 1 VIEW COMPARISON:  09/24/2021 FINDINGS: Cardiac shadow is stable. Gastric catheter is noted extending into the stomach. Mild bibasilar atelectatic changes are noted new from the prior exam. No acute bony abnormality is noted. IMPRESSION: Gastric catheter within the stomach. Bibasilar atelectasis is noted. Electronically Signed   By: Inez Catalina M.D.   On: 10/16/2021 22:17   CT ANGIO GI BLEED  Result Date: 10/17/2021 CLINICAL DATA:  61 year old female with blood and blood clots per rectum. History of liver failure. Alcoholic hepatitis. EXAM: CTA ABDOMEN AND PELVIS WITHOUT AND WITH CONTRAST TECHNIQUE: Multidetector CT imaging of the abdomen and pelvis was performed using the standard protocol during bolus administration of intravenous contrast. Multiplanar reconstructed images and MIPs were obtained and reviewed to evaluate the vascular anatomy. RADIATION DOSE REDUCTION: This exam was performed according to the departmental dose-optimization program which includes automated exposure control, adjustment of the mA and/or kV according to patient size and/or use of iterative reconstruction technique. CONTRAST:  131mL OMNIPAQUE IOHEXOL 350 MG/ML SOLN COMPARISON:  Recent Abdomen MRI 10/10/2021, CT Abdomen 10/09/2021 FINDINGS: VASCULAR Normal abdominal aorta and its major branches. Minimal atherosclerosis at the aortoiliac bifurcation. Patent bilateral iliac arteries. No arterial aneurysm or dissection. Left femoral approach venous catheter tip terminates at the confluence of the left internal and external iliac veins. Patent portal venous system with  mass effect on the intrahepatic portal veins and hepatic veins as demonstrated 2 days ago. Normal caliber IVC. Review of the MIP images confirms the above findings. NON-VASCULAR Lower chest: Stable small layering pleural effusions and lung base atelectasis. No pericardial effusion. Hepatobiliary:  Severely abnormal liver, hepatomegaly, heterogeneous steatosis and enhancement unchanged from the recent CT and MRI. Massively distended gallbladder, volume estimated at 200 mL. Some gallbladder vicarious contrast excretion suspected. No definite pericholecystic fluid. Pancreas: Indistinct pancreas with small volume ascites in the lesser sac appears stable since 10/09/2021. Acute pancreatitis would be difficult to exclude. No pancreatic ductal enlargement. No pancreatic atrophy. Spleen: No splenomegaly. Small volume perisplenic ascites. Adrenals/Urinary Tract: Nonobstructed kidneys, but with retention of contrast, ongoing contrast excretion on the initial noncontrast phase images. No hydroureter. Bladder decompressed by Foley catheter, with small volume of excreted IV contrast in the bladder. Incidental pelvic phleboliths. Right renal benign angiomyolipoma again noted. Stomach/Bowel: Featureless large and small bowel loops throughout the abdomen. Intermittent gas, retained stool, and also fluid in the large bowel. Flocculated stool type material also in the distal small bowel. Following IV contrast no extravasation into the bowel lumen is identified on the early arterial or on the delayed images. Enteric tube terminates in the distal stomach. No free air. Lymphatic: No lymphadenopathy. Reproductive: Multiple uterine fibroids, some subserosal and pedunculated. Individual fibroids up to 2 cm. Some calcified. Other: Streak artifact. But ascites in the lower abdomen and pelvis now appears mildly complex, as opposed to simple fluid density 2 days ago. Volume of fluid is fairly small. Musculoskeletal: No acute osseous abnormality identified. Anasarca. IMPRESSION: VASCULAR 1. No contrast extravasation identified within the bowel lumen. 2. Normal abdominal and pelvic arteries with minimal atherosclerosis. 3. Mass effect on the intrahepatic portal veins and hepatic veins related to abnormal liver. NON-VASCULAR 1. Anasarca,  with stable small layering pleural effusions. Small volume ascites which now has mildly complex fluid density - new from two days ago. Consider peritonitis. Hemoperitoneum felt less likely. 2. Severe abnormal Liver, unchanged. Gallbladder Hydrops (200 mL gallbladder). 3. Delayed bilateral nephrograms suggesting acute intrinsic renal disease / acute renal insufficiency. No obstructive uropathy. 4. Indistinct pancreas stable since 10/09/2021. Difficult to exclude Acute Pancreatitis. 5. Featureless large and small bowel loops with no bowel obstruction or discrete bowel inflammation identified. No free air. Enteric tube terminates in the distal stomach. Electronically Signed   By: Genevie Ann M.D.   On: 11/05/2021 05:28    Scheduled Meds:  chlorhexidine  15 mL Mouth Rinse BID   Chlorhexidine Gluconate Cloth  6 each Topical Daily   folic acid  1 mg Per Tube Daily   lactulose  20 g Per Tube TID   levETIRAcetam  500 mg Per Tube BID   levothyroxine  75 mcg Per Tube Q0600   mouth rinse  15 mL Mouth Rinse q12n4p   multivitamin with minerals  1 tablet Per Tube Daily   pantoprazole (PROTONIX) IV  40 mg Intravenous Q12H   rifaximin  550 mg Per Tube BID   sodium bicarbonate  1,300 mg Per Tube BID   thiamine  100 mg Per Tube Daily   Continuous Infusions:  sodium chloride Stopped (10/07/2021 0555)   norepinephrine (LEVOPHED) Adult infusion 1 mcg/min (10/12/21 0500)   octreotide  (SANDOSTATIN)    IV infusion 50 mcg/hr (10/12/21 0500)   piperacillin-tazobactam (ZOSYN)  IV 3.375 g (10/12/21 0601)   potassium chloride 10 mEq (10/12/21 0729)   PRN Meds:.sodium chloride, HYDROmorphone (DILAUDID) injection, hydrOXYzine, ondansetron (ZOFRAN)  IV   ASSESMENT:   Rectal bleeding, hematochezia starting ~ 3/4.    . 10/15/2021 flex sig: Blood in rectum and sigmoid with no obvious source for bleeding despite lavage. 10/21/2021 CTA w/o active bleed.  Mass effect on intrahepatic portal veins and hepatic veins related to parenchymal  liver changes.  Anasarca, small ascites which now has mildly complex fluid density new from 2 days ago, consider peritonitis, hemoperitoneum less likely.  Severe liver changes.  Gallbladder hydrops.  Delayed nephrogram suggest acute renal insufficiency.  Indistinct pancreas, stable, difficult to exclude pancreatitis.  Featureless small bowel and colon. 10/10/2021 NM bleeding scan: no active bleeding.   Protonix switched from po to IV yesterday.    ETOH hepatitis.  Severe.  Prednisolone 2/26 - 3/3, unresponsive.  Also has H63D mutation for hereditary hemochromatosis. Ultrasound 2/25: sludge, gallbladder wall thickening, 8.1 mm CBD, fatty liver, reversed flow through portal vein.  Liver Dopplers show hepatofugal flow in right and main portal vein preserved antegrade flow in left portal vein.  Patent splenic and superior mesenteric veins.  Empiric Zosyn initiated. For concern of cholecystitis CT abdomen 3/3: Marked hepatomegaly, nodularity, diffusely decreased liver attenuation significantly change since mid December.  Portal and hepatic venous anatomy show areas of mass effect but not markedly distorted overall this may reflect progression of fatty liver but given nodularity cirrhosis, mets or other infiltrative process not excluded.  MRI recommended.  Massively distended gallbladder and tiny calcified gallstone.  Small volume ascites.  Collapse, consolidative disease in both lungs with small pleural effusions. MR liver 3/4: Incomplete exam due to impact of respiratory artifact.  But reveals severe hepatomegaly without liver masses.  Gallbladder distended likely contains sludge tiny gallstones.  Bile ducts normal.  Small amount of ascites, pleural effusions, anasarca. T bili, Alk phos, Transaminases better (still T bili 24.7). Octreotide since 3/5 Surgery last saw 2/27.  Suggested 7-day antibiotics for possible cholecystitis.  Day 9 Zosyn.  Did not recommend surgery. GI bleed interrupted planned  cholecystostomy tube 3/5     Blood loss anemia.  4 PRBCs in previous 2 d.      AKI.  GFR >60 ... 20.  Urine output 55... 230 mL previous 2 d.  Urine Na   Coagulopathy.INR 3.1... 2.1.  FFP x 2 on 3/5.  Previous Vit K IV and po.  Repeat INR pndg.      Groin Hematoma at site L femoral central line.    HE.  Rifaximin and lactulose in place.    Metabolic acidosis.   Thrombocytopenia.      Hyponatremia.  Stable ~ 131.      Hypocalcemia.     PLAN     Ask for renal guidance?  Midodrine?.      Perc cholecystostomy tube for today.  CCM may need to give additional FFP depending on INR,     Mentally seems ok for po but NGT is still in place (has been "clamped" for several hours between studies and self removing tubes x 2.       EGD tmrw.  [place] for clears today if NGT removed. NPO after midnight.       Pt and sign other are interested in getting him assigned as legal HCPOA.  She is estranged from blood relations.      Azucena Freed  10/12/2021, 8:08 AM Phone [phone]

## 2021-10-12 NOTE — Progress Notes (Signed)
?  Hypoglycemic Event ? ?CBG: 68 ? ?Treatment: D50 25 mL (12.5 gm) ? ?Symptoms: None ? ?Follow-up CBG: Time:1620  CBG Result:132 ? ?Possible Reasons for Event: Inadequate meal intake and Medication regimen:   ? ?Comments/MD notified:D10 '@30'$  added, DO Clark made aware ? ? ? ?Charmayne Sheer ? ? ?

## 2021-10-12 NOTE — Progress Notes (Signed)
Physical Therapy Treatment ?Patient Details ?Name: Judy Meyer ?MRN: [id number] ?DOB: 1961-01-09 ?Today's Date: 10/12/2021 ? ? ?History of Present Illness Judy Meyer is a 61 y.o. admitted 2/25 who presented with worsening of her jaundice, nausea, vomiting and admitted for acute alcoholic hepatitis.  On 3/4, pt with hypotension and lower GI bleed, transferred to ICU. Initially planned for percutaneous cholecystostomy tube placement on 3/5 but placed on hold due to bleeding. L groin hematoma noted after femoral central line placed on 3/5. PMH of alcohol associated hepatitis, hypothyroidism, hypertension, seizures, anemia, hyperlipidemia and alcohol use disorder ? ?  ?PT Comments  ? ? Pt supine in bed on R side, initially requests not to move, however in course of making sure that pt not laying on any medical devices to put her at risk for pressure injury, pt found to have had BM. Pt able to rolling R and L with min A for pericare and clean pad placement. Pt with sacral foam dressing in place however soiled and when removed skin found to be macerated from multiple BM. Notified RN and recommended possible Gerhardt's. RN in agreement and also placed [place] RN order. Will continue to recommend AIR however discharge disposition may change due to inability to tolerate intensive therapy. PT will continue to follow acutely.  ?  ?Recommendations for follow up therapy are one component of a multi-disciplinary discharge planning process, led by the attending physician.  Recommendations may be updated based on patient status, additional functional criteria and insurance authorization. ? ?Follow Up Recommendations ? Acute inpatient rehab (3hours/day) ?  ?  ?Assistance Recommended at Discharge Frequent or constant Supervision/Assistance  ?Patient can return home with the following A lot of help with walking and/or transfers;A lot of help with bathing/dressing/bathroom ?  ?Equipment Recommendations ? None recommended by PT  ?  ?    ?Precautions / Restrictions Precautions ?Precautions: Fall ?Precaution Comments: watch BP, NGT ?Restrictions ?Weight Bearing Restrictions: No  ?  ? ?Mobility ? Bed Mobility ?Overal bed mobility: Needs Assistance ?Bed Mobility: Rolling ?Rolling: Min assist ?  ?  ?  ?  ?General bed mobility comments: min A for rolling R and L for pericare and to place clean linens due to BM ?  ? ?Transfers ?  ?  ?  ?  ?  ?  ?  ?  ?  ?General transfer comment: pt requests no further movement ?  ? ?  ?   ?Cognition Arousal/Alertness: Awake/alert ?Behavior During Therapy: Flat affect ?Overall Cognitive Status: No family/caregiver present to determine baseline cognitive functioning ?  ?  ?  ?  ?  ?  ?  ?  ?  ?  ?  ?  ?  ?  ?  ?  ?General Comments: follows directions consistently, some decreased awareness of deficits, reason for NPO, medical complexities. Slower motor planning ?  ?  ? ?  ?   ?General Comments General comments (skin integrity, edema, etc.): Pt with foam dressing in place, skin underneath found to be macerated from frequent BM due to lactulose, RN notified and requested [place] RN consult ?  ?  ? ?Pertinent Vitals/Pain Pain Assessment ?Pain Assessment: Faces ?Faces Pain Scale: Hurts even more ?Pain Location: abdomen, L groin hematoma, buttocks excoriation ?Pain Descriptors / Indicators: Guarding, Grimacing, Sore ?Pain Intervention(s): Limited activity within patient's tolerance, Monitored during session, Repositioned  ? ? ? ?PT Goals (current goals can now be found in the care plan section) Acute Rehab PT Goals ?PT Goal Formulation: With  patient ?Time For Goal Achievement: 10/21/21 ?Potential to Achieve Goals: Fair ?Progress towards PT goals: Not progressing toward goals - comment ? ?  ?Frequency ? ? ? Min 3X/week ? ? ? ?  ?PT Plan Current plan remains appropriate  ? ? ?   ?AM-PAC PT "6 Clicks" Mobility   ?Outcome Measure ? Help needed turning from your back to your side while in a flat bed without using bedrails?: A  Little ?Help needed moving from lying on your back to sitting on the side of a flat bed without using bedrails?: A Lot ?Help needed moving to and from a bed to a chair (including a wheelchair)?: A Lot ?Help needed standing up from a chair using your arms (e.g., wheelchair or bedside chair)?: A Lot ?Help needed to walk in hospital room?: A Lot ?Help needed climbing 3-5 steps with a railing? : Total ?6 Click Score: 12 ? ?  ?End of Session   ?Activity Tolerance: Patient limited by lethargy ?Patient left: in bed;with call bell/phone within reach ?Nurse Communication: Mobility status ?PT Visit Diagnosis: Muscle weakness (generalized) (M62.81) ?  ? ? ?Time: 2248-2500 ?PT Time Calculation (min) (ACUTE ONLY): 24 min ? ?Charges:  $Therapeutic Activity: 23-37 mins          ?          ? ?Ivy Meriwether B. Migdalia Dk PT, DPT ?Acute Rehabilitation Services ?Pager [phone] ?Office [phone] ? ? ? ?[place] ?10/12/2021, 3:50 PM ? ?

## 2021-10-13 ENCOUNTER — Inpatient Hospital Stay (HOSPITAL_COMMUNITY): Payer: Medicare PPO | Admitting: Anesthesiology

## 2021-10-13 ENCOUNTER — Inpatient Hospital Stay (HOSPITAL_COMMUNITY): Payer: Medicare PPO

## 2021-10-13 ENCOUNTER — Encounter (HOSPITAL_COMMUNITY): Payer: Self-pay | Admitting: Internal Medicine

## 2021-10-13 ENCOUNTER — Encounter (HOSPITAL_COMMUNITY): Payer: Medicare PPO

## 2021-10-13 ENCOUNTER — Encounter (HOSPITAL_COMMUNITY): Admission: EM | Disposition: E | Payer: Self-pay | Source: Home / Self Care | Attending: Internal Medicine

## 2021-10-13 DIAGNOSIS — K2970 Gastritis, unspecified, without bleeding: Secondary | ICD-10-CM

## 2021-10-13 DIAGNOSIS — D62 Acute posthemorrhagic anemia: Secondary | ICD-10-CM | POA: Diagnosis not present

## 2021-10-13 DIAGNOSIS — I97638 Postprocedural hematoma of a circulatory system organ or structure following other circulatory system procedure: Secondary | ICD-10-CM

## 2021-10-13 DIAGNOSIS — E872 Acidosis, unspecified: Secondary | ICD-10-CM | POA: Diagnosis not present

## 2021-10-13 DIAGNOSIS — Z789 Other specified health status: Secondary | ICD-10-CM | POA: Diagnosis not present

## 2021-10-13 DIAGNOSIS — E8721 Acute metabolic acidosis: Secondary | ICD-10-CM

## 2021-10-13 DIAGNOSIS — E039 Hypothyroidism, unspecified: Secondary | ICD-10-CM

## 2021-10-13 DIAGNOSIS — K7030 Alcoholic cirrhosis of liver without ascites: Secondary | ICD-10-CM

## 2021-10-13 DIAGNOSIS — J9601 Acute respiratory failure with hypoxia: Secondary | ICD-10-CM | POA: Diagnosis not present

## 2021-10-13 DIAGNOSIS — I10 Essential (primary) hypertension: Secondary | ICD-10-CM

## 2021-10-13 DIAGNOSIS — Z9911 Dependence on respirator [ventilator] status: Secondary | ICD-10-CM

## 2021-10-13 DIAGNOSIS — D638 Anemia in other chronic diseases classified elsewhere: Secondary | ICD-10-CM

## 2021-10-13 DIAGNOSIS — K767 Hepatorenal syndrome: Secondary | ICD-10-CM | POA: Diagnosis not present

## 2021-10-13 DIAGNOSIS — D689 Coagulation defect, unspecified: Secondary | ICD-10-CM | POA: Diagnosis not present

## 2021-10-13 DIAGNOSIS — K2971 Gastritis, unspecified, with bleeding: Secondary | ICD-10-CM

## 2021-10-13 DIAGNOSIS — K7010 Alcoholic hepatitis without ascites: Secondary | ICD-10-CM | POA: Diagnosis not present

## 2021-10-13 DIAGNOSIS — K921 Melena: Secondary | ICD-10-CM

## 2021-10-13 HISTORY — PX: ESOPHAGOGASTRODUODENOSCOPY (EGD) WITH PROPOFOL: SHX5813

## 2021-10-13 LAB — POCT I-STAT EG7
Acid-base deficit: 7 mmol/L — ABNORMAL HIGH (ref 0.0–2.0)
Bicarbonate: 17.7 mmol/L — ABNORMAL LOW (ref 20.0–28.0)
Calcium, Ion: 0.92 mmol/L — ABNORMAL LOW (ref 1.15–1.40)
HCT: 21 % — ABNORMAL LOW (ref 36.0–46.0)
Hemoglobin: 7.1 g/dL — ABNORMAL LOW (ref 12.0–15.0)
O2 Saturation: 44 %
Patient temperature: 35.7
Potassium: 3.5 mmol/L (ref 3.5–5.1)
Sodium: 135 mmol/L (ref 135–145)
TCO2: 19 mmol/L — ABNORMAL LOW (ref 22–32)
pCO2, Ven: 31.4 mmHg — ABNORMAL LOW (ref 44–60)
pH, Ven: 7.353 (ref 7.25–7.43)
pO2, Ven: 24 mmHg — CL (ref 32–45)

## 2021-10-13 LAB — PREPARE RBC (CROSSMATCH)

## 2021-10-13 LAB — PREPARE FRESH FROZEN PLASMA

## 2021-10-13 LAB — BASIC METABOLIC PANEL
Anion gap: 17 — ABNORMAL HIGH (ref 5–15)
BUN: 35 mg/dL — ABNORMAL HIGH (ref 6–20)
CO2: 14 mmol/L — ABNORMAL LOW (ref 22–32)
Calcium: 6.8 mg/dL — ABNORMAL LOW (ref 8.9–10.3)
Chloride: 103 mmol/L (ref 98–111)
Creatinine, Ser: 2.88 mg/dL — ABNORMAL HIGH (ref 0.44–1.00)
GFR, Estimated: 18 mL/min — ABNORMAL LOW (ref >60)
Glucose, Bld: 114 mg/dL — ABNORMAL HIGH (ref 70–99)
Potassium: 3.6 mmol/L (ref 3.5–5.1)
Sodium: 134 mmol/L — ABNORMAL LOW (ref 135–145)

## 2021-10-13 LAB — PROTIME-INR
INR: 2 — ABNORMAL HIGH (ref 0.8–1.2)
Prothrombin Time: 22.8 seconds — ABNORMAL HIGH (ref 11.4–15.2)

## 2021-10-13 LAB — CBC
HCT: 21.2 % — ABNORMAL LOW (ref 36.0–46.0)
Hemoglobin: 7.2 g/dL — ABNORMAL LOW (ref 12.0–15.0)
MCH: 31.3 pg (ref 26.0–34.0)
MCHC: 34 g/dL (ref 30.0–36.0)
MCV: 92.2 fL (ref 80.0–100.0)
Platelets: 120 10*3/uL — ABNORMAL LOW (ref 150–400)
RBC: 2.3 MIL/uL — ABNORMAL LOW (ref 3.87–5.11)
RDW: 19.9 % — ABNORMAL HIGH (ref 11.5–15.5)
WBC: 34 10*3/uL — ABNORMAL HIGH (ref 4.0–10.5)
nRBC: 8.6 % — ABNORMAL HIGH (ref 0.0–0.2)

## 2021-10-13 LAB — POCT I-STAT 7, (LYTES, BLD GAS, ICA,H+H)
Acid-base deficit: 8 mmol/L — ABNORMAL HIGH (ref 0.0–2.0)
Bicarbonate: 16 mmol/L — ABNORMAL LOW (ref 20.0–28.0)
Calcium, Ion: 0.86 mmol/L — CL (ref 1.15–1.40)
HCT: 15 % — ABNORMAL LOW (ref 36.0–46.0)
Hemoglobin: 5.1 g/dL — CL (ref 12.0–15.0)
O2 Saturation: 100 %
Patient temperature: 35.8
Potassium: 3.4 mmol/L — ABNORMAL LOW (ref 3.5–5.1)
Sodium: 135 mmol/L (ref 135–145)
TCO2: 17 mmol/L — ABNORMAL LOW (ref 22–32)
pCO2 arterial: 22.9 mmHg — ABNORMAL LOW (ref 32–48)
pH, Arterial: 7.447 (ref 7.35–7.45)
pO2, Arterial: 388 mmHg — ABNORMAL HIGH (ref 83–108)

## 2021-10-13 LAB — BPAM FFP
Blood Product Expiration Date: 202303092359
Blood Product Expiration Date: 202303092359
ISSUE DATE / TIME: 202303061800
ISSUE DATE / TIME: 202303062355
Unit Type and Rh: 6200
Unit Type and Rh: 6200

## 2021-10-13 LAB — COMPREHENSIVE METABOLIC PANEL
ALT: 77 U/L — ABNORMAL HIGH (ref 0–44)
AST: 323 U/L — ABNORMAL HIGH (ref 15–41)
Albumin: 3.2 g/dL — ABNORMAL LOW (ref 3.5–5.0)
Alkaline Phosphatase: 142 U/L — ABNORMAL HIGH (ref 38–126)
Anion gap: 15 (ref 5–15)
BUN: 33 mg/dL — ABNORMAL HIGH (ref 6–20)
CO2: 16 mmol/L — ABNORMAL LOW (ref 22–32)
Calcium: 6.7 mg/dL — ABNORMAL LOW (ref 8.9–10.3)
Chloride: 100 mmol/L (ref 98–111)
Creatinine, Ser: 2.79 mg/dL — ABNORMAL HIGH (ref 0.44–1.00)
GFR, Estimated: 19 mL/min — ABNORMAL LOW (ref >60)
Glucose, Bld: 91 mg/dL (ref 70–99)
Potassium: 3.3 mmol/L — ABNORMAL LOW (ref 3.5–5.1)
Sodium: 131 mmol/L — ABNORMAL LOW (ref 135–145)
Total Bilirubin: 30 mg/dL (ref 0.3–1.2)
Total Protein: 5.7 g/dL — ABNORMAL LOW (ref 6.5–8.1)

## 2021-10-13 LAB — GLOBAL TEG PANEL
CFF Max Amplitude: 20.1 mm (ref 15–32)
CK with Heparinase (R): 11 min — ABNORMAL HIGH (ref 4.3–8.3)
Citrated Functional Fibrinogen: 366.8 mg/dL (ref 278–581)
Citrated Kaolin (K): 1.9 min (ref 0.8–2.1)
Citrated Kaolin (MA): 61.9 mm (ref 52–69)
Citrated Kaolin (R): 12.5 min — ABNORMAL HIGH (ref 4.6–9.1)
Citrated Kaolin Angle: 65.8 deg (ref 63–78)
Citrated Rapid TEG (MA): 61.6 mm (ref 52–70)

## 2021-10-13 LAB — GLUCOSE, CAPILLARY
Glucose-Capillary: 93 mg/dL (ref 70–99)
Glucose-Capillary: 113 mg/dL — ABNORMAL HIGH (ref 70–99)
Glucose-Capillary: 135 mg/dL — ABNORMAL HIGH (ref 70–99)

## 2021-10-13 LAB — LACTIC ACID, PLASMA: Lactic Acid, Venous: 2.9 mmol/L (ref 0.5–1.9)

## 2021-10-13 LAB — HEMOGLOBIN AND HEMATOCRIT, BLOOD
HCT: 17.7 % — ABNORMAL LOW (ref 36.0–46.0)
Hemoglobin: 6.2 g/dL — CL (ref 12.0–15.0)

## 2021-10-13 LAB — FIBRINOGEN: Fibrinogen: 187 mg/dL — ABNORMAL LOW (ref 210–475)

## 2021-10-13 IMAGING — DX DG ABDOMEN 1V
1 series · 1 of 1 positions shown · non-contrast
Comparison: None.

CLINICAL DATA: Placement of enteric tube

EXAM:
ABDOMEN - 1 VIEW

[abdomen]
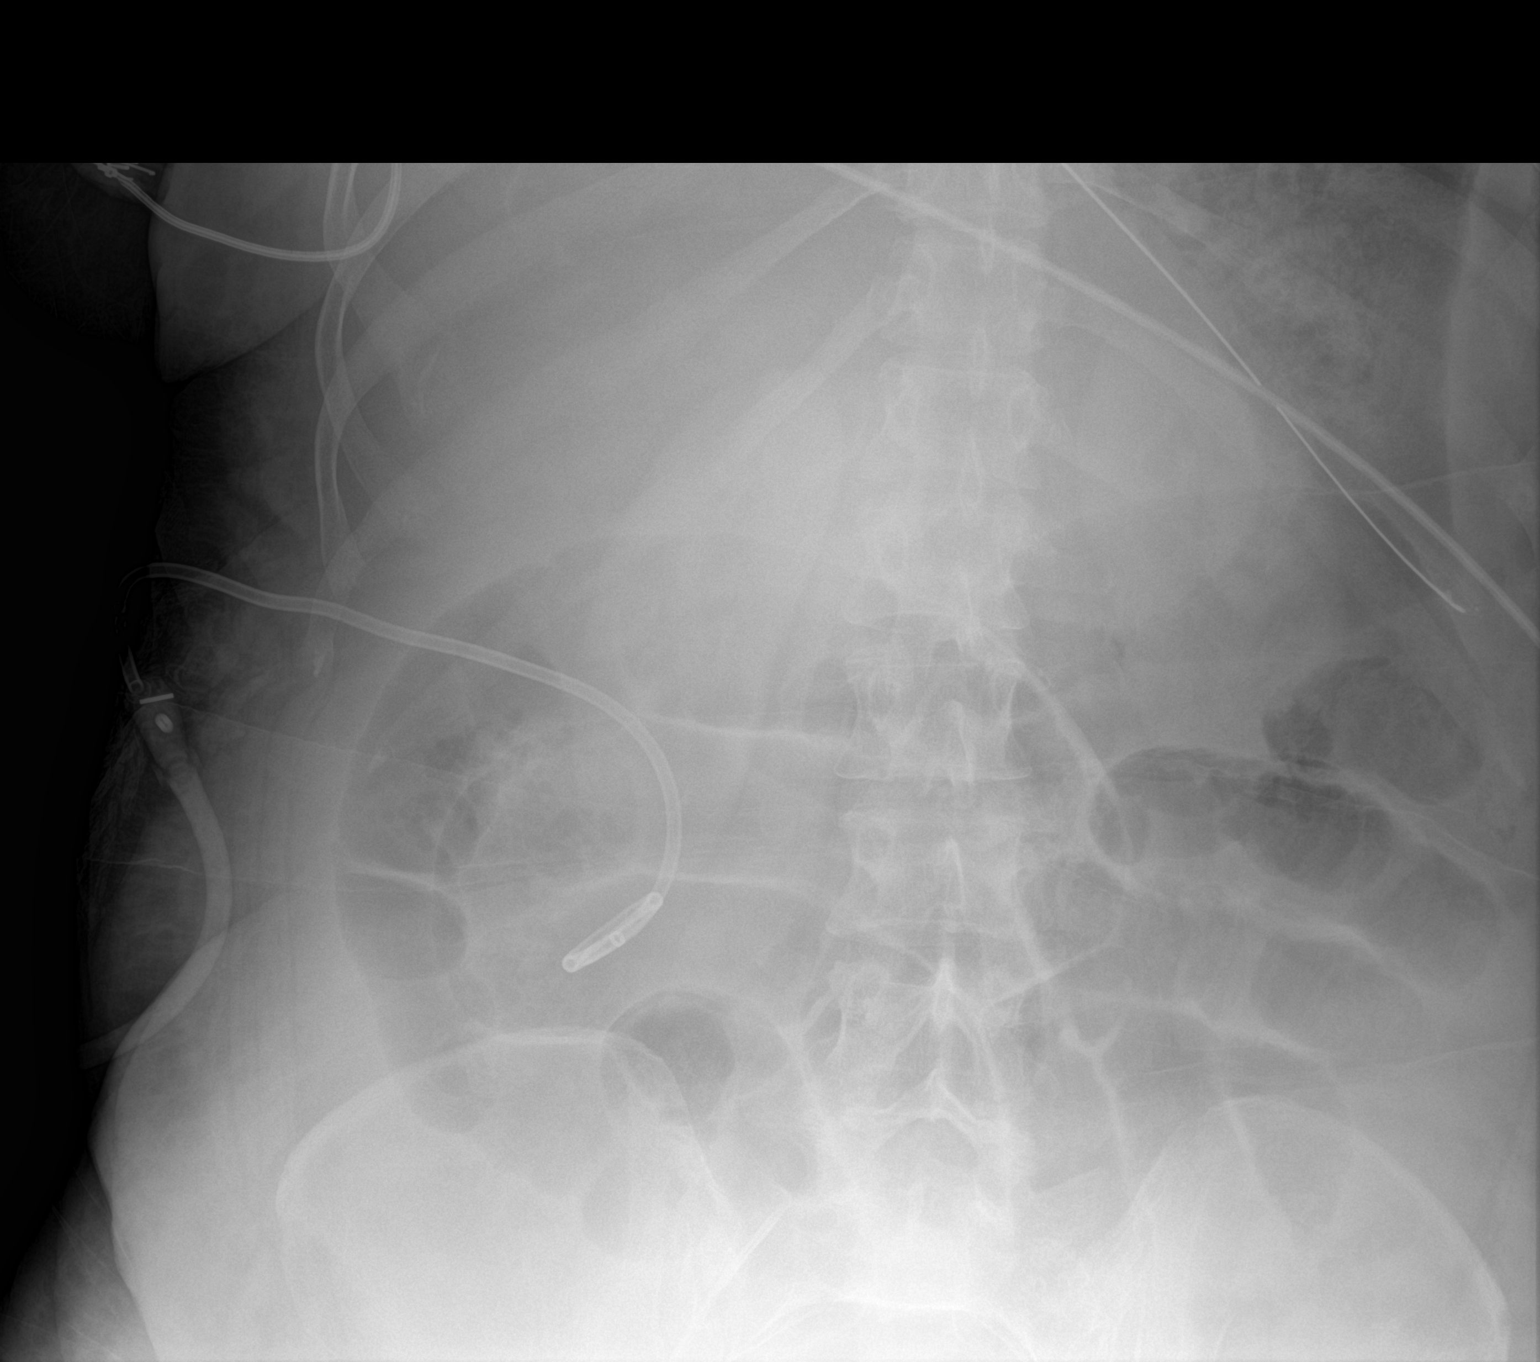

[1 of 1 positions shown; findings below may reference images not displayed]

FINDINGS: There is dilation of small-bowel loops measuring up to 4.8 cm. Tip
of enteric tube is seen in the stomach. There is a pigtail catheter
in the right side of abdomen, possibly cholecystostomy catheter.
IMPRESSION: Tip of enteric tube is seen in the stomach. There is dilation of
small-bowel loops suggesting ileus or partial small bowel
obstruction. Cholecystostomy catheter is seen in the right side of
abdomen.

## 2021-10-13 SURGERY — ESOPHAGOGASTRODUODENOSCOPY (EGD) WITH PROPOFOL
Anesthesia: Monitor Anesthesia Care

## 2021-10-13 MED ORDER — TRANEXAMIC ACID 1000 MG/10ML IV SOLN
1000.0000 mg | Freq: Once | INTRAVENOUS | Status: DC
  Administered 2021-10-13: 1000 mg via INTRAVENOUS
  Filled 2021-10-13: qty 10

## 2021-10-13 MED ORDER — DEXMEDETOMIDINE HCL IN NACL 400 MCG/100ML IV SOLN: 0.4000 ug/kg/h | INTRAVENOUS | Status: DC

## 2021-10-13 MED ORDER — SODIUM CHLORIDE 0.9% FLUSH
5.0000 mL | Freq: Three times a day (TID) | INTRAVENOUS | Status: DC
  Administered 2021-10-13: 5 mL

## 2021-10-13 MED ORDER — SODIUM CHLORIDE 0.9 % IV SOLN
20.0000 ug | Freq: Once | INTRAVENOUS | Status: AC
  Administered 2021-10-13: 20 ug via INTRAVENOUS
  Filled 2021-10-13: qty 5

## 2021-10-13 MED ORDER — SODIUM CHLORIDE 0.9% IV SOLUTION: Freq: Once | INTRAVENOUS | Status: DC

## 2021-10-13 MED ORDER — STERILE WATER FOR INJECTION IV SOLN
INTRAVENOUS | Status: DC
  Filled 2021-10-13: qty 1000

## 2021-10-13 MED ORDER — HYDROMORPHONE BOLUS VIA INFUSION
0.2500 mg | INTRAVENOUS | Status: DC | PRN
  Administered 2021-10-13: 2 mg via INTRAVENOUS
  Filled 2021-10-13: qty 2

## 2021-10-13 MED ORDER — SODIUM CHLORIDE 0.9 % IV SOLN: 100.0000 mg | INTRAVENOUS | Status: DC

## 2021-10-13 MED ORDER — ACETAMINOPHEN 650 MG RE SUPP: 650.0000 mg | Freq: Four times a day (QID) | RECTAL | Status: DC | PRN

## 2021-10-13 MED ORDER — HYDROMORPHONE HCL 1 MG/ML IJ SOLN: 1.0000 mg | Freq: Once | INTRAMUSCULAR | Status: DC

## 2021-10-13 MED ORDER — POLYETHYLENE GLYCOL 3350 17 G PO PACK: 17.0000 g | PACK | Freq: Every day | ORAL | Status: DC

## 2021-10-13 MED ORDER — DIPHENHYDRAMINE HCL 50 MG/ML IJ SOLN: 25.0000 mg | INTRAMUSCULAR | Status: DC | PRN

## 2021-10-13 MED ORDER — SODIUM CHLORIDE 0.9 % IV SOLN: INTRAVENOUS | Status: DC | PRN

## 2021-10-13 MED ORDER — FENTANYL CITRATE (PF) 100 MCG/2ML IJ SOLN: 25.0000 ug | INTRAMUSCULAR | Status: DC | PRN

## 2021-10-13 MED ORDER — SODIUM CHLORIDE 0.9 % IV BOLUS: 500.0000 mL | Freq: Once | INTRAVENOUS | Status: DC

## 2021-10-13 MED ORDER — POTASSIUM CHLORIDE 10 MEQ/50ML IV SOLN
10.0000 meq | INTRAVENOUS | Status: AC
  Administered 2021-10-13 (×2): 10 meq via INTRAVENOUS
  Filled 2021-10-13 (×2): qty 50

## 2021-10-13 MED ORDER — LIDOCAINE-EPINEPHRINE (PF) 1 %-1:200000 IJ SOLN
10.0000 mL | Freq: Once | INTRAMUSCULAR | Status: AC
  Administered 2021-10-13: 10 mL
  Filled 2021-10-13: qty 10

## 2021-10-13 MED ORDER — LIDOCAINE HCL (PF) 1 % IJ SOLN
INTRAMUSCULAR | Status: AC
  Filled 2021-10-13: qty 5

## 2021-10-13 MED ORDER — CALCIUM GLUCONATE-NACL 2-0.675 GM/100ML-% IV SOLN
2.0000 g | Freq: Once | INTRAVENOUS | Status: AC
  Administered 2021-10-13: 2000 mg via INTRAVENOUS
  Filled 2021-10-13: qty 100

## 2021-10-13 MED ORDER — ETOMIDATE 2 MG/ML IV SOLN
INTRAVENOUS | Status: AC
  Administered 2021-10-13: 20 mg via INTRAVENOUS
  Filled 2021-10-13: qty 20

## 2021-10-13 MED ORDER — SODIUM CHLORIDE 0.9 % IV SOLN
200.0000 mg | Freq: Once | INTRAVENOUS | Status: AC
  Administered 2021-10-13: 200 mg via INTRAVENOUS
  Filled 2021-10-13: qty 200

## 2021-10-13 MED ORDER — CALCIUM GLUCONATE-NACL 1-0.675 GM/50ML-% IV SOLN
1.0000 g | Freq: Once | INTRAVENOUS | Status: AC
  Administered 2021-10-13: 1000 mg via INTRAVENOUS
  Filled 2021-10-13: qty 50

## 2021-10-13 MED ORDER — ETOMIDATE 2 MG/ML IV SOLN: 20.0000 mg | Freq: Once | INTRAVENOUS | Status: AC

## 2021-10-13 MED ORDER — FENTANYL CITRATE (PF) 100 MCG/2ML IJ SOLN
INTRAMUSCULAR | Status: DC | PRN
Start: 2021-10-13 — End: 2021-10-13
  Administered 2021-10-13: 25 ug via INTRAVENOUS

## 2021-10-13 MED ORDER — SODIUM CHLORIDE 0.9 % IV SOLN
0.5000 mg/h | INTRAVENOUS | Status: DC
  Administered 2021-10-13: 0.5 mg/h via INTRAVENOUS
  Filled 2021-10-13: qty 5

## 2021-10-13 MED ORDER — DEXMEDETOMIDINE HCL IN NACL 400 MCG/100ML IV SOLN
INTRAVENOUS | Status: AC
  Administered 2021-10-13: 0.4 ug/kg/h via INTRAVENOUS
  Filled 2021-10-13: qty 100

## 2021-10-13 MED ORDER — ROCURONIUM BROMIDE 10 MG/ML (PF) SYRINGE: 1.0000 mg/kg | PREFILLED_SYRINGE | Freq: Once | INTRAVENOUS | Status: AC

## 2021-10-13 MED ORDER — ROCURONIUM BROMIDE 10 MG/ML (PF) SYRINGE
PREFILLED_SYRINGE | INTRAVENOUS | Status: AC
  Administered 2021-10-13: 78.1 mg via INTRAVENOUS
  Filled 2021-10-13: qty 10

## 2021-10-13 MED ORDER — PROPOFOL 500 MG/50ML IV EMUL
INTRAVENOUS | Status: DC | PRN
Start: 2021-10-13 — End: 2021-10-13
  Administered 2021-10-13: 50 ug/kg/min via INTRAVENOUS

## 2021-10-13 MED ORDER — TRANEXAMIC ACID-NACL 1000-0.7 MG/100ML-% IV SOLN
1000.0000 mg | Freq: Once | INTRAVENOUS | Status: AC
  Administered 2021-10-13: 1000 mg via INTRAVENOUS
  Filled 2021-10-13: qty 100

## 2021-10-13 MED ORDER — POLYVINYL ALCOHOL 1.4 % OP SOLN
1.0000 [drp] | Freq: Four times a day (QID) | OPHTHALMIC | Status: DC | PRN
  Filled 2021-10-13: qty 15

## 2021-10-13 MED ORDER — GLYCOPYRROLATE 1 MG PO TABS: 1.0000 mg | ORAL_TABLET | ORAL | Status: DC | PRN

## 2021-10-13 MED ORDER — HYDROMORPHONE HCL 1 MG/ML IJ SOLN
INTRAMUSCULAR | Status: AC
  Filled 2021-10-13: qty 2

## 2021-10-13 MED ORDER — TRANEXAMIC ACID-NACL 1000-0.7 MG/100ML-% IV SOLN: 1000.0000 mg | Freq: Once | INTRAVENOUS | Status: DC

## 2021-10-13 MED ORDER — DOCUSATE SODIUM 50 MG/5ML PO LIQD: 100.0000 mg | Freq: Two times a day (BID) | ORAL | Status: DC

## 2021-10-13 MED ORDER — GLYCOPYRROLATE 0.2 MG/ML IJ SOLN: 0.2000 mg | INTRAMUSCULAR | Status: DC | PRN

## 2021-10-13 MED ORDER — ACETAMINOPHEN 325 MG PO TABS: 650.0000 mg | ORAL_TABLET | Freq: Four times a day (QID) | ORAL | Status: DC | PRN

## 2021-10-13 MED ORDER — VASOPRESSIN 20 UNITS/100 ML INFUSION FOR SHOCK
0.0000 [IU]/min | INTRAVENOUS | Status: DC
  Administered 2021-10-13: 0.03 [IU]/min via INTRAVENOUS
  Filled 2021-10-13: qty 100

## 2021-10-13 SURGICAL SUPPLY — 15 items

## 2021-10-14 ENCOUNTER — Encounter (HOSPITAL_COMMUNITY): Payer: Self-pay | Admitting: Gastroenterology

## 2021-10-14 LAB — TYPE AND SCREEN
ABO/RH(D): O POS
Antibody Screen: NEGATIVE
Unit division: 0
Unit division: 0
Unit division: 0
Unit division: 0
Unit division: 0
Unit division: 0
Unit division: 0
Unit division: 0
Unit division: 0
Unit division: 0

## 2021-10-14 LAB — BPAM RBC
Blood Product Expiration Date: 202303042359
Blood Product Expiration Date: 202303142359
Blood Product Expiration Date: 202303242359
Blood Product Expiration Date: 202304042359
Blood Product Expiration Date: 202304052359
Blood Product Expiration Date: 202304052359
Blood Product Expiration Date: 202304052359
Blood Product Expiration Date: 202304052359
Blood Product Expiration Date: 202304052359
Blood Product Expiration Date: 202304052359
ISSUE DATE / TIME: 202303041340
ISSUE DATE / TIME: 202303041834
ISSUE DATE / TIME: 202303050100
ISSUE DATE / TIME: 202303050339
ISSUE DATE / TIME: 202303062009
ISSUE DATE / TIME: 202303071434
ISSUE DATE / TIME: 202303071525
ISSUE DATE / TIME: 202303071525
Unit Type and Rh: 5100
Unit Type and Rh: 5100
Unit Type and Rh: 5100
Unit Type and Rh: 5100
Unit Type and Rh: 5100
Unit Type and Rh: 5100
Unit Type and Rh: 5100
Unit Type and Rh: 5100
Unit Type and Rh: 5100
Unit Type and Rh: 9500

## 2021-10-14 LAB — BPAM FFP
Blood Product Expiration Date: 202303112359
Blood Product Expiration Date: 202303112359
Blood Product Expiration Date: 202303112359
Blood Product Expiration Date: 202303122359
Blood Product Expiration Date: 202303122359
Blood Product Expiration Date: 202303122359
ISSUE DATE / TIME: 202303070228
ISSUE DATE / TIME: 202303071048
ISSUE DATE / TIME: 202303071247
ISSUE DATE / TIME: 202303071334
ISSUE DATE / TIME: 202303071613
ISSUE DATE / TIME: 202303071613
Unit Type and Rh: 600
Unit Type and Rh: 600
Unit Type and Rh: 5100
Unit Type and Rh: 6200
Unit Type and Rh: 5100
Unit Type and Rh: 5100

## 2021-10-14 LAB — PREPARE FRESH FROZEN PLASMA: Unit division: 0

## 2021-10-14 LAB — BPAM CRYOPRECIPITATE
Blood Product Expiration Date: 202303071945
ISSUE DATE / TIME: 202303071432
Unit Type and Rh: 5100

## 2021-10-14 LAB — PREPARE CRYOPRECIPITATE: Unit division: 0

## 2021-10-16 LAB — CULTURE, BLOOD (ROUTINE X 2)
Culture: NO GROWTH
Culture: NO GROWTH
Special Requests: ADEQUATE

## 2021-10-17 LAB — AEROBIC/ANAEROBIC CULTURE W GRAM STAIN (SURGICAL/DEEP WOUND)

## 2021-10-18 LAB — CRYOGLOBULIN

## 2021-11-07 NOTE — Anesthesia Procedure Notes (Signed)
Procedure Name: [place] ?Date/Time: November 10, 2021 7:59 AM ?Performed by: Imagene Riches, CRNA ?Pre-anesthesia Checklist: Patient identified, Emergency Drugs available, Suction available, Patient being monitored and Timeout performed ?Patient Re-evaluated:Patient Re-evaluated prior to induction ?Oxygen Delivery Method: Nasal cannula ? ? ? ? ?

## 2021-11-07 NOTE — Procedures (Signed)
Central Venous Catheter Insertion Procedure Note ? ?Judy Meyer  ?[id number]  ?March 06, 1961 ? ?Date:October 24, 2021  ?Time:12:24 PM  ? ?Provider Performing:Mireya Meditz Wells Guiles  ? ?Procedure: Insertion of Non-tunneled Central Venous Catheter(36556) with US guidance (56387)  ? ?Indication(s) ?Medication administration ? ?Consent ?Risks of the procedure as well as the alternatives and risks of each were explained to the patient and/or caregiver.  Consent for the procedure was obtained and is signed in the bedside chart ? ?Anesthesia ?Topical only with 1% lidocaine  with epinephrine ? ?Timeout ?Verified patient identification, verified procedure, site/side was marked, verified correct patient position, special equipment/implants available, medications/allergies/relevant history reviewed, required imaging and test results available. ? ?Sterile Technique ?Maximal sterile technique including full sterile barrier drape, hand hygiene, sterile gown, sterile gloves, mask, hair covering, sterile ultrasound probe cover (if used). ? ?Procedure Description ?Area of catheter insertion was cleaned with chlorhexidine and draped in sterile fashion.  With real-time ultrasound guidance a central venous catheter was placed into the right femoral vein. Nonpulsatile blood flow and easy flushing noted in all ports.  The catheter was sutured in place and sterile dressing applied. ? ?Complications/Tolerance ?None; patient tolerated the procedure well. ?Chest X-ray is ordered to verify placement for internal jugular or subclavian cannulation.   Chest x-ray is not ordered for femoral cannulation. ? ?EBL ?Minimal ? ?Specimen(s) ?None  ? ? ? ? ? ? ?Georgann Housekeeper, AGACNP-BC ?[facility] ? ?See Amion for personal pager ?PCCM on call pager [phone] until 7pm. ?Please call Elink 7p-7a. [phone] ? ?10/24/21 12:25 PM ? ? ? ? ?

## 2021-11-07 NOTE — Transfer of Care (Signed)
Immediate Anesthesia Transfer of Care Note ? ?Patient: KESHONDA MONSOUR ? ?Procedure(s) Performed: ESOPHAGOGASTRODUODENOSCOPY (EGD) WITH PROPOFOL ? ?Patient Location: PACU ? ?Anesthesia Type:MAC ? ?Level of Consciousness: drowsy ? ?Airway & Oxygen Therapy: Patient Spontanous Breathing and Patient connected to nasal cannula oxygen ? ?Post-op Assessment: Report given to RN and Post -op Vital signs reviewed and stable ? ?Post vital signs: Reviewed and stable ? ?Last Vitals:  ?Vitals Value Taken Time  ?BP 151/56 10-17-2021 0813  ?Temp    ?Pulse 86 2021-10-17 0814  ?Resp 17 10/17/21 0814  ?SpO2 100 % October 17, 2021 0814  ?Vitals shown include unvalidated device data. ? ?Last Pain:  ?Vitals:  ? 10-17-2021 0728  ?TempSrc: Temporal  ?PainSc: 9   ?   ? ?Patients Stated Pain Goal: 0 (10/12/21 2200) ? ?Complications: No notable events documented. ?

## 2021-11-07 NOTE — Interval H&P Note (Signed)
History and Physical Interval Note: ? ?11-08-2021 ?7:00 AM ? ?Judy Meyer  has presented today for surgery, with the diagnosis of gi bleed.  severe acute liver dz in setting ETOH, hemochromatosis gene mutation. blood loss anemia..  The various methods of treatment have been discussed with the patient and family. After consideration of risks, benefits and other options for treatment, the patient has consented to  Procedure(s): ?ESOPHAGOGASTRODUODENOSCOPY (EGD) WITH PROPOFOL (N/A) as a surgical intervention.  The patient's history has been reviewed, patient examined, no change in status, stable for surgery.  I have reviewed the patient's chart and labs.  Questions were answered to the patient's satisfaction.   ? ? ?[name] ? ? ?

## 2021-11-07 NOTE — Progress Notes (Signed)
Patient extubated per withdrawal order. S/o at bedside with RN. ?

## 2021-11-07 NOTE — Consult Note (Addendum)
[facility] [place] CONSULT NOTE  VASCULAR SURGERY ASSESSMENT & PLAN:   LEFT GROIN HEMATOMA: This patient had a venous line placed in the left groin 3 days ago and has developed ecchymosis and some hematoma in the left groin.  This catheter was removed today after new access was placed on the right side.  Critical care medicine has aggressively corrected her coagulopathy.  She is critically ill and is in the process of being intubated.  I will order a duplex of the left groin tomorrow to rule out a pseudoaneurysm.  Given that she is fairly unstable today I do not think this needs to be done urgently today.  The only reason to consider operative exploration of the left groin would be for evidence of active bleeding or compromised skin from pressure from the hematoma.  Currently there is no evidence of either of these.  Vascular surgery will follow.  Judy Gallop, MD 1:40 PM   MRN : [id number]  Reason for Consult: left groin hematoma  Referring Physician: CCM  History of Present Illness: 61 y/o female admitted with acute alcoholic liver disease associated hepatitis and sever jaundice followed by [facility] hepatology.  On 10/10/21 left femoral venous central line was placed and she has developed left groin hematoma that has progressed in size without skin damage at this time.  We have been consulted to recommendations and treatment options.    She has GI bleed history, and episodes of  hematochezia.  She also has  Cholecystitis that was treated by IR for drain due to liver failure and not being a good surgical candidate.         Current Facility-Administered Medications  Medication Dose Route Frequency Provider Last Rate Last Admin   0.9 %  sodium chloride infusion (Manually program via Guardrails IV Fluids)   Intravenous Once Corey Harold, NP       0.9 %  sodium chloride infusion   Intravenous PRN [name], MD   Stopped at 11/06/2021 0555   albumin human 25 % solution  25 g  25 g Intravenous Q8H [name], MD 60 mL/hr at 10/22/2021 0700 Infusion Verify at 2021/10/22 0700   anidulafungin (ERAXIS) 200 mg in sodium chloride 0.9 % 200 mL IVPB  200 mg Intravenous Once Julian Hy, DO       Followed by   Derrill Memo ON 10/14/2021] anidulafungin (ERAXIS) 100 mg in sodium chloride 0.9 % 100 mL IVPB  100 mg Intravenous Q24H Noemi Chapel P, DO       chlorhexidine (PERIDEX) 0.12 % solution 15 mL  15 mL Mouth Rinse BID [name], MD   15 mL at 10/12/21 2206   Chlorhexidine Gluconate Cloth 2 % PADS 6 each  6 each Topical Daily [name], MD   6 each at 2021-10-22 0600   desmopressin (DDAVP) 20 mcg in sodium chloride 0.9 % 50 mL IVPB  20 mcg Intravenous Once Corey Harold, NP       dextrose 10 % infusion   Intravenous Continuous [name], MD 30 mL/hr at 2021-10-22 0744 Continued from Pre-op at 40/98/11 9147   folic acid (FOLVITE) tablet 1 mg  1 mg Per Tube Daily [name], MD   1 mg at 10/12/21 1136   HYDROmorphone (DILAUDID) injection 0.25 mg  0.25 mg Intravenous Q3H PRN [name], MD   0.25 mg at 2021-10-22 1028   hydrOXYzine (ATARAX) tablet 25 mg  25 mg  Per Tube Q12H PRN [name], MD       iohexol (OMNIPAQUE) 300 MG/ML solution 100 mL  100 mL Per Tube Once PRN [name], MD       lactulose ([place]) 10 GM/15ML solution 20 g  20 g Per Tube TID [name], MD   20 g at 10/12/21 2156   levETIRAcetam (KEPPRA) 100 MG/ML solution 500 mg  500 mg Per Tube BID [name], MD   500 mg at 10/12/21 2156   levothyroxine (SYNTHROID) tablet 75 mcg  75 mcg Per Tube Q0600 [name], MD       liver oil-zinc oxide (DESITIN) 40 % ointment   Topical Q4H [name], MD   Given at 2021/11/02 9528   MEDLINE mouth rinse  15 mL Mouth Rinse q12n4p [name], MD   15 mL at 10/12/21 1555   multivitamin with minerals tablet 1 tablet  1 tablet Per Tube Daily [name], MD   1 tablet at 10/12/21 1136   norepinephrine  (LEVOPHED) 16 mg in 296m premix infusion  0-40 mcg/min Intravenous Titrated JMilus Banister MD 15.94 mL/hr at [id number] 17 mcg/min at [id number]   octreotide (SANDOSTATIN) 500 mcg in sodium chloride 0.9 % 250 mL (2 mcg/mL) infusion  50 mcg/hr Intravenous Continuous JMilus Banister MD 25 mL/hr at [id number]/27/[id number] 50 mcg/hr at 003/27/20230744   ondansetron (ZOFRAN) injection 4 mg  4 mg Intravenous Q8H PRN JMilus Banister MD   4 mg at 10/09/21 1736   pantoprazole (PROTONIX) injection 40 mg  40 mg Intravenous Q12H JMilus Banister MD   40 mg at [id number]   piperacillin-tazobactam (ZOSYN) IVPB 3.375 g  3.375 g Intravenous Q8H JMilus Banister MD 12.5 mL/hr at [id number] Infusion Verify at 0Mar 27, [id number]   rifaximin (XIFAXAN) tablet 550 mg  550 mg Per Tube BID JMilus Banister MD   550 mg at 10/12/21 2156   sodium bicarbonate tablet 1,300 mg  1,300 mg Per Tube BID JMilus Banister MD   1,300 mg at 10/12/21 2156   sodium chloride flush (NS) 0.9 % injection 5 mL  5 mL Intracatheter Q8H JMilus Banister MD   5 mL at 0March 27, [id number]   thiamine tablet 100 mg  100 mg Per Tube Daily JMilus Banister MD   100 mg at 10/12/21 1136    Pt meds include: Statin :No Betablocker: No ASA: No Other anticoagulants/antiplatelets: none  Past Medical History:  Diagnosis Date   Hypertension    Hypothyroidism    Liver disease    Uterine fibroid     Past Surgical History:  Procedure Laterality Date   FLEXIBLE SIGMOIDOSCOPY N/A 10/21/2021   Procedure: FLEXIBLE SIGMOIDOSCOPY;  Surgeon: HCarol Ada MD;  [facility]  Service: Gastroenterology;  Laterality: N/A;   hand fracture surgery Right    IR PERC CHOLECYSTOSTOMY  10/12/2021   LIVER BIOPSY      Social History Social History   Tobacco Use   Smoking status: Never   Smokeless tobacco: Never  Vaping Use   Vaping Use: Some days   Start date: 09/09/2020  Substance Use Topics   Alcohol use: Yes   Drug use: Never    Family  History Family History  Problem Relation Age of Onset   Hypertension Other    Hypertension Mother    High Cholesterol Mother     Allergies  Allergen Reactions   Compazine [  Prochlorperazine Edisylate] Anaphylaxis   Haloperidol Other (See Comments)    Other reaction(s): Seizure (finding) Other reaction(s): Seizure (finding)    Periactin [Cyproheptadine] Anaphylaxis   Cyclobenzaprine Other (See Comments)    Palpations    Haloperidol And Related Other (See Comments)    Caused seizure   Metoclopramide Other (See Comments)    Caused seizure     REVIEW OF SYSTEMS  General: '[ ]'$  Weight loss, '[ ]'$  Fever, '[ ]'$  chills Neurologic: '[ ]'$  Dizziness, '[ ]'$  Blackouts, '[ ]'$  Seizure '[ ]'$  Stroke, '[ ]'$  "Mini stroke", '[ ]'$  Slurred speech, '[ ]'$  Temporary blindness; '[ ]'$  weakness in arms or legs, '[ ]'$  Hoarseness '[ ]'$  Dysphagia Cardiac: '[ ]'$  Chest pain/pressure, '[ ]'$  Shortness of breath at rest '[ ]'$  Shortness of breath with exertion, '[ ]'$  Atrial fibrillation or irregular heartbeat  Vascular: '[ ]'$  Pain in legs with walking, '[ ]'$  Pain in legs at rest, '[ ]'$  Pain in legs at night,  '[ ]'$  Non-healing ulcer, '[ ]'$  Blood clot in vein/DVT,   Pulmonary: '[ ]'$  Home oxygen, '[ ]'$  Productive cough, '[ ]'$  Coughing up blood, '[ ]'$  Asthma,  '[ ]'$  Wheezing '[ ]'$  COPD Musculoskeletal:  '[ ]'$  Arthritis, '[ ]'$  Low back pain, '[ ]'$  Joint pain Hematologic: '[ ]'$  Easy Bruising, [ x] Anemia; [ x] Hepatitis Gastrointestinal: [ x] Blood in stool, '[ ]'$  Gastroesophageal Reflux/heartburn, '[x]'$  jaundice  Urinary: '[ ]'$  chronic Kidney disease, '[ ]'$  on HD - '[ ]'$  MWF or '[ ]'$  TTHS, '[ ]'$  Burning with urination, '[ ]'$  Difficulty urinating Skin: '[ ]'$  Rashes, '[ ]'$  Wounds Psychological: '[ ]'$  Anxiety, '[ ]'$  Depression  Physical Examination Vitals:   November 01, 2021 0815 2021/11/01 0830 11-01-2021 0841 November 01, 2021 0844  BP: (!) 151/56 (!) 148/60 (!) 160/58   Pulse: 86 86 84 83  Resp: '17 18 19 19  '$ Temp: 98.5 F (36.9 C)   (!) 97.5 F (36.4 C)  TempSrc:      SpO2: 100% 97% 100% 98%  Weight:       Height:       Body mass index is 27.79 kg/m.  General:  appears ill, frank systemic jaundice  HENT: did not open eyes, jaundice, speech is clear to yes no answers  Pulmonary:  non-labored breathing , without Rales, rhonchi,  wheezing Cardiac: RRR, without  Murmurs, rubs or gallops; No carotid bruits Abdomen: right cholecystic drain, firm to palpation tender to palption Skin: no rashes, ulcers noted;  no Gangrene , no cellulitis; no open wounds;  Left groin with ecchymosis and hematoma firmness surround central line.  No skin compromise currently noted. Vascular Exam/Pulses palpable femoral right , radials, DP pulses B Musculoskeletal: no muscle wasting or atrophy; no edema  Neurologic: A&O X 3; Appropriate Affect ;  SENSATION: normal; MOTOR FUNCTION: grossly intact  Speech is fluent/normal   Significant Diagnostic Studies: CBC Lab Results  Component Value Date   WBC 34.0 (H) 01-Nov-2021   HGB 7.1 (L) November 01, 2021   HCT 21.0 (L) Nov 01, 2021   MCV 92.2 Nov 01, 2021   PLT 120 (L) 01-Nov-2021    BMET    Component Value Date/Time   NA 135 November 01, 2021 0939   K 3.5 01-Nov-2021 0939   CL 100 2021-11-01 0224   CO2 16 (L) Nov 01, 2021 0224   GLUCOSE 91 11/01/21 0224   BUN 33 (H) 11/01/21 0224   CREATININE 2.79 (H) 11-01-2021 0224   CALCIUM 6.7 (L) 11/01/21 0224   GFRNONAA 19 (L) 2021-11-01 0224   GFRAA >60  04/07/2020 0601   Estimated Creatinine Clearance: 22.9 mL/min (A) (by C-G formula based on SCr of 2.79 mg/dL (H)).  COAG Lab Results  Component Value Date   INR 2.0 (H) Oct 16, 2021   INR 2.1 (H) 10/12/2021   INR 2.3 (H) 10/12/2021       ASSESSMENT/PLAN:  Left femoral venous line with Hematoma. Hepatitis with chronic alcoholic liver disease.   History of GI bleed with multiple episodes.  S/p upper endoscopy and flex sigmoid.  The left groin skin is not compromised.  There is firmness around the line and it's softer in the periphery.    5 PRBC and 6 FFP,  tranexamic given to assist with bleeding.  Previous Vit K for correction last dose 10/12/21.    We recommend recommend aggressive correction of INR.  INR elevated 2.0 today.  Please find alternate central line access.  Once this is done we can get a pseudoaneurysm duplex verses CTA to better identify the bleeding source.     We will follow.      Roxy Horseman 16-Oct-2021 11:04 AM

## 2021-11-07 NOTE — Progress Notes (Addendum)
Supervising Physician: Aletta Edouard  Patient Status:  [facility] - In-pt  Chief Complaint:  RUQ pain, sepsis with gall bladder hydrops. Found to have acute cholecystitis.IR placed a cholecystomy tube on 3.6.23  Subjective:  Patient ill appearing. Reports RUQ pain unchanged since cholecystomy tube placement.   Allergies: Compazine [prochlorperazine edisylate], Haloperidol, Periactin [cyproheptadine], Cyclobenzaprine, Haloperidol and related, and Metoclopramide  Medications: Prior to Admission medications   Medication Sig Start Date End Date Taking? Authorizing Provider  omeprazole (PRILOSEC) 20 MG capsule Take 20 mg by mouth daily.   Yes [provider]  albuterol (VENTOLIN HFA) 108 (90 Base) MCG/ACT inhaler Inhale 2 puffs into the lungs 4 (four) times daily as needed for wheezing or shortness of breath. Patient not taking: Reported on 09/11/2021 12/20/19   [provider]  amLODipine (NORVASC) 10 MG tablet Take 1 tablet (10 mg total) by mouth daily. Patient not taking: Reported on 09/24/2021 11/21/20   Mercy Riding, MD  levETIRAcetam (KEPPRA) 500 MG tablet Take 1 tablet (500 mg total) by mouth 2 (two) times daily. Patient not taking: Reported on 09/20/2021 03/20/21   Hosie Poisson, MD  levothyroxine (SYNTHROID) 75 MCG tablet Take 1 tablet (75 mcg total) by mouth daily at 6 (six) AM. Patient not taking: Reported on 09/21/2021 03/21/21   Hosie Poisson, MD  ondansetron (ZOFRAN) 4 MG tablet Take 1 tablet (4 mg total) by mouth daily as needed for nausea or vomiting. Patient not taking: Reported on 09/29/2021 11/20/20 11/20/21  Mercy Riding, MD  ondansetron (ZOFRAN) 4 MG tablet Take 1 tablet (4 mg total) by mouth every 6 (six) hours. Patient not taking: Reported on 09/15/2021 07/25/21   Valarie Merino, MD  potassium chloride SA (KLOR-CON) 20 MEQ tablet Take 1 tablet (20 mEq total) by mouth daily. Patient not taking: Reported on 09/11/2021 03/20/21   Hosie Poisson, MD   venlafaxine XR (EFFEXOR-XR) 75 MG 24 hr capsule Take 1 capsule (75 mg total) by mouth daily. Patient not taking: Reported on 09/20/2021 11/20/20   Mercy Riding, MD     Vital Signs: BP (!) 160/58    Pulse 83    Temp (!) 97.5 F (36.4 C)    Resp 19    Ht '5\' 6"'$  (1.676 m)    Wt 172 lb 2.9 oz (78.1 kg)    LMP 06/11/2018    SpO2 98%    BMI 27.79 kg/m   Physical Exam Vitals and nursing note reviewed.  Constitutional:      Appearance: She is ill-appearing.  HENT:     Head: Normocephalic and atraumatic.  Eyes:     Conjunctiva/sclera: Conjunctivae normal.  Cardiovascular:     Rate and Rhythm: Normal rate and regular rhythm.  Abdominal:     Comments: Positive RUQ drain to gravity bag. Site is unremarkable with no erythema, edema, tenderness, bleeding or drainage noted at exit site. Dressing is clean dry and intact. 50 ml of  serous  colored fluid noted in gravity device. Drain is able to be flushed easily.    Musculoskeletal:        General: Normal range of motion.     Cervical back: Normal range of motion.  Skin:    General: Skin is warm.  Neurological:     Mental Status: She is alert and oriented to person, place, and time.    Imaging: NM GI Blood Loss  Result Date: 10/10/2021 CLINICAL DATA:  GI hemorrhage for 1 day with negative CTA EXAM: NUCLEAR  MEDICINE GASTROINTESTINAL BLEEDING SCAN TECHNIQUE: Sequential abdominal images were obtained following intravenous administration of Tc-32mlabeled red blood cells. RADIOPHARMACEUTICALS:  21.4 mCi Tc-921mertechnetate in-vitro labeled red cells. COMPARISON:  CTA from earlier in the same day. FINDINGS: Injection is noted via a left femoral central line. Normal vascular activity is identified. Minimal pooling is noted in the urinary bladder. No focal area of increased activity with mobility is noted to suggest acute GI hemorrhage. IMPRESSION: No findings to suggest acute GI hemorrhage. Electronically Signed   By: MaInez Catalina.D.   On: 10/15/2021  20:02   MR LIVER WO CONRTAST  Result Date: 10/10/2021 CLINICAL DATA:  Liver failure, alcoholic hepatitis EXAM: MRI ABDOMEN WITHOUT CONTRAST TECHNIQUE: Multiplanar multisequence MR imaging was performed without the administration of intravenous contrast. Patient declined to complete the full examination, limited sequences provided. COMPARISON:  CT abdomen pelvis, 10/09/2021 FINDINGS: Incomplete examination provided for review is further significantly limited by breath motion artifact throughout. Lower chest: Small bilateral pleural effusions.  Cardiomegaly. Hepatobiliary: Severe hepatomegaly. No obvious mass or other discrete abnormality of the liver parenchyma. Distended gallbladder again noted, likely containing sludge and tiny gallstones. No biliary ductal dilatation. Pancreas: No mass, inflammatory changes, or other parenchymal abnormality identified.No pancreatic ductal dilatation. Spleen:  Within normal limits in size and appearance. Adrenals/Urinary Tract: Normal adrenal glands. 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 No renal masses or suspicious contrast enhancement identified. No evidence of hydronephrosis. Stomach/Bowel: Visualized portions within the abdomen are unremarkable. Vascular/Lymphatic: No pathologically enlarged lymph nodes identified. No abdominal aortic aneurysm demonstrated. Other:  Anasarca.  Small volume ascites throughout the abdomen. Musculoskeletal: No suspicious osseous lesions identified. IMPRESSION: 1. Incomplete examination provided for review is further significantly limited by breath motion artifact throughout. 2. Within this limitation, severe hepatomegaly. No obvious mass or other discrete abnormality of the liver parenchyma on noncontrast MR. 3. Distended gallbladder again noted, likely containing sludge and tiny gallstones. No biliary ductal dilatation. 4. Small volume ascites, pleural effusions, and anasarca.  Electronically Signed   By: AlDelanna Ahmadi.D.   On: 10/10/2021 13:22   IR Perc Cholecystostomy  Result Date: 10/10/14/2023LINICAL DATA:  Sepsis, gallbladder hydrops, clinical diagnosis of acute cholecystitis EXAM: PERCUTANEOUS CHOLECYSTOSTOMY TUBE PLACEMENT WITH ULTRASOUND AND FLUOROSCOPIC GUIDANCE FLUOROSCOPY: Radiation Exposure Index (as provided by the fluoroscopic device): 10 mGy air Kerma TECHNIQUE: Survey ultrasound of the abdomen was performed and an appropriate skin entry site was identified. Skin site was marked, prepped with Betadine, and draped in usual sterile fashion, and infiltrated locally with 1% lidocaine. Intravenous versed '1mg'$  were administered as anxiolytic during continuous monitoring of the patient's level of consciousness and physiological / cardiorespiratory status by the radiology RN. Under real-time ultrasound guidance, gallbladder was accessed using a transhepatic approach with a 21-gauge needle. Ultrasound image documentation was saved. Bile returned through the hub. Needle was exchanged over a 018 guidewire for transitional dilator which allowed placement of 035 J wire. Over this, a 10.2 French pigtail catheter was advanced and formed centrally in the gallbladder lumen. Small contrast injection confirmed appropriate position. Catheter secured externally with 0 Prolene suture and placed external drain bag. Patient tolerated the procedure well. COMPLICATIONS: COMPLICATIONS none IMPRESSION: 1. Technically successful percutaneous cholecystostomy tube placement with ultrasound and fluoroscopic guidance. Electronically Signed   By: D Lucrezia Europe.D.   On: 032023/03/160:04   DG CHEST PORT 1 VIEW  Result Date: 10/12/2021 CLINICAL DATA:  Encounter for nasogastric (NG) tube placement  Z46.59 (ICD-10-CM) EXAM: PORTABLE CHEST 1 VIEW COMPARISON:  October 11, 2021. FINDINGS: Is a gastric tube courses below the diaphragm with the tip in the expected region the stomach. Side port is below the GE  junction. Similar appearance of low lung volumes with bibasilar opacities. No visible pleural effusions or pneumothorax. Similar cardiomediastinal silhouette, accentuated by AP technique and low lung volumes. IMPRESSION: 1. Nasogastric tube in the stomach with side port below the GE junction. 2. Similar low lung volumes and bibasilar opacities, most likely atelectasis. Electronically Signed   By: Margaretha Sheffield M.D.   On: 10/12/2021 07:18   DG CHEST PORT 1 VIEW  Result Date: 11/06/2021 CLINICAL DATA:  Nasogastric catheter placement EXAM: PORTABLE CHEST 1 VIEW COMPARISON:  09/17/2021 FINDINGS: Cardiac shadow is stable. Gastric catheter is noted extending into the stomach. Mild bibasilar atelectatic changes are noted new from the prior exam. No acute bony abnormality is noted. IMPRESSION: Gastric catheter within the stomach. Bibasilar atelectasis is noted. Electronically Signed   By: Inez Catalina M.D.   On: 10/20/2021 22:17   CT ANGIO GI BLEED  Result Date: 10/19/2021 CLINICAL DATA:  61 year old female with blood and blood clots per rectum. History of liver failure. Alcoholic hepatitis. EXAM: CTA ABDOMEN AND PELVIS WITHOUT AND WITH CONTRAST TECHNIQUE: Multidetector CT imaging of the abdomen and pelvis was performed using the standard protocol during bolus administration of intravenous contrast. Multiplanar reconstructed images and MIPs were obtained and reviewed to evaluate the vascular anatomy. RADIATION DOSE REDUCTION: This exam was performed according to the departmental dose-optimization program which includes automated exposure control, adjustment of the mA and/or kV according to patient size and/or use of iterative reconstruction technique. CONTRAST:  148m OMNIPAQUE IOHEXOL 350 MG/ML SOLN COMPARISON:  Recent Abdomen MRI 10/10/2021, CT Abdomen 10/09/2021 FINDINGS: VASCULAR Normal abdominal aorta and its major branches. Minimal atherosclerosis at the aortoiliac bifurcation. Patent bilateral iliac  arteries. No arterial aneurysm or dissection. Left femoral approach venous catheter tip terminates at the confluence of the left internal and external iliac veins. Patent portal venous system with mass effect on the intrahepatic portal veins and hepatic veins as demonstrated 2 days ago. Normal caliber IVC. Review of the MIP images confirms the above findings. NON-VASCULAR Lower chest: Stable small layering pleural effusions and lung base atelectasis. No pericardial effusion. Hepatobiliary: Severely abnormal liver, hepatomegaly, heterogeneous steatosis and enhancement unchanged from the recent CT and MRI. Massively distended gallbladder, volume estimated at 200 mL. Some gallbladder vicarious contrast excretion suspected. No definite pericholecystic fluid. Pancreas: Indistinct pancreas with small volume ascites in the lesser sac appears stable since 10/09/2021. Acute pancreatitis would be difficult to exclude. No pancreatic ductal enlargement. No pancreatic atrophy. Spleen: No splenomegaly. Small volume perisplenic ascites. Adrenals/Urinary Tract: Nonobstructed kidneys, but with retention of contrast, ongoing contrast excretion on the initial noncontrast phase images. No hydroureter. Bladder decompressed by Foley catheter, with small volume of excreted IV contrast in the bladder. Incidental pelvic phleboliths. Right renal benign angiomyolipoma again noted. Stomach/Bowel: Featureless large and small bowel loops throughout the abdomen. Intermittent gas, retained stool, and also fluid in the large bowel. Flocculated stool type material also in the distal small bowel. Following IV contrast no extravasation into the bowel lumen is identified on the early arterial or on the delayed images. Enteric tube terminates in the distal stomach. No free air. Lymphatic: No lymphadenopathy. Reproductive: Multiple uterine fibroids, some subserosal and pedunculated. Individual fibroids up to 2 cm. Some calcified. Other: Streak artifact.  But ascites in the lower abdomen and  pelvis now appears mildly complex, as opposed to simple fluid density 2 days ago. Volume of fluid is fairly small. Musculoskeletal: No acute osseous abnormality identified. Anasarca. IMPRESSION: VASCULAR 1. No contrast extravasation identified within the bowel lumen. 2. Normal abdominal and pelvic arteries with minimal atherosclerosis. 3. Mass effect on the intrahepatic portal veins and hepatic veins related to abnormal liver. NON-VASCULAR 1. Anasarca, with stable small layering pleural effusions. Small volume ascites which now has mildly complex fluid density - new from two days ago. Consider peritonitis. Hemoperitoneum felt less likely. 2. Severe abnormal Liver, unchanged. Gallbladder Hydrops (200 mL gallbladder). 3. Delayed bilateral nephrograms suggesting acute intrinsic renal disease / acute renal insufficiency. No obstructive uropathy. 4. Indistinct pancreas stable since 10/09/2021. Difficult to exclude Acute Pancreatitis. 5. Featureless large and small bowel loops with no bowel obstruction or discrete bowel inflammation identified. No free air. Enteric tube terminates in the distal stomach. Electronically Signed   By: Genevie Ann M.D.   On: 10/12/2021 05:28    Labs:  CBC: Recent Labs    11/01/2021 2041 10/12/21 0426 10/12/21 1549 Oct 23, 2021 0224 October 23, 2021 0939  WBC 42.6* 39.0* 39.1* 34.0*  --   HGB 7.5* 7.0* 6.0* 7.2* 7.1*  HCT 21.1* 19.2* 16.9* 21.2* 21.0*  PLT 149* 127* 127* 120*  --     COAGS: Recent Labs    11/16/20 0908 11/17/20 0437 11/06/2021 0508 10/20/2021 1431 10/26/2021 2041 10/12/21 1055 10/12/21 2000 10/23/2021 0224  INR  --    < > 3.1*   < > 2.1* 2.3* 2.1* 2.0*  APTT 26  --  70*  --   --   --   --   --    < > = values in this interval not displayed.    BMP: Recent Labs    10/09/2021 0508 10/12/21 0426 10/12/21 1055 10/12/21 1549 Oct 23, 2021 0224 10-23-21 0939  NA 130* 131*  --  130* 131* 135  K 3.3* 3.2* 3.5 3.6 3.3* 3.5  CL 100 101  --   98 100  --   CO2 15* 17*  --  18* 16*  --   GLUCOSE 107* 97  --  154* 91  --   BUN 28* 32*  --  34* 33*  --   CALCIUM 6.2* 6.4*  --  6.6* 6.7*  --   CREATININE 2.33* 2.64*  --  2.76* 2.79*  --   GFRNONAA 24* 20*  --  19* 19*  --     LIVER FUNCTION TESTS: Recent Labs    10/09/21 0131 10/10/21 0824 10/25/2021 0508 Oct 23, 2021 0224  BILITOT 34.1* 37.7* 24.7* 30.0*  AST 406* 355* 241* 323*  ALT 110* 92* 59* 77*  ALKPHOS 206* 189* 119 142*  PROT 6.0* 5.7* 4.2* 5.7*  ALBUMIN 2.0* 2.7* 2.3* 3.2*    Assessment and Plan:  61 y.o. female inpatient. History of EtOH hepatitis, anemia, HLD, HTN, hypothyroidism. Presented to the ED at [facility] on 2.25.23 jaundiced with RUQ pain concern for acute cholecystis. Now septic. IR placed a cholecystomy tube on 3.7.23.   Drain Location: RUQ Size: Fr size: 10 Fr Date of placement: 3.6.23  Currently to: Drain collection device: gravity 24 hour output:  Output by Drain (mL) 10/16/2021 0701 - 10/23/2021 1900 10/27/2021 1901 - 10/12/21 0700 10/12/21 0701 - 10/12/21 1900 10/12/21 1901 - 10/23/2021 0700 October 23, 2021 0701 - 2021/10/23 1106  Closed System Drain Lateral;Right RUQ 10.2 Fr.   25 15   50 ml of serous output noted to be in the  gravity bag.  BUN 33, Cr 2.79 lactic acid 2.89, Total bilirubin 30.0, INR 2.0, Albumin 3.2, Alkaline phosphatase 142. Hgb 7.1. Microbiopsly shows WBC presents both MON and Mononuclear. Cultures shows few year. Preincubated.  Interval imaging/drain manipulation:  None since drain placement   Current examination: Flushes/aspirates easily.  Insertion site unremarkable. Suture and stat lock in place. Dressed appropriately.   Plan: Continue TID flushes with 5 cc NS. Record output Q shift. Dressing changes QD or PRN if soiled.  Call IR APP or on call IR MD if difficulty flushing or sudden change in drain output.  Repeat imaging/possible drain injection once output < 10 mL/QD (excluding flush material.)  Discharge planning: Please contact  IR APP or on call IR MD prior to patient d/c to ensure appropriate follow up plans are in place.  Cholecystomy tube drain will need to remain in place at least 4-6 weeks unless gallbladder surgically removed in interim; continue tid drain flushes in house and once daily with 5 ml sterile normal saline as outpatient; will schedule fe/U cholangiogram in 4-6 weeks   IR will continue to follow - please call with questions or concerns.  Electronically Signed: Jacqualine Mau, NP 26-Oct-2021, 11:01 AM   I spent a total of 15 Minutes at the patient's bedside AND on the patient's hospital floor or unit, greater than 50% of which was counseling/coordinating care for cholecystomy tube placement

## 2021-11-07 NOTE — Progress Notes (Signed)
PCCM INTERVAL PROGRESS NOTE ? ?Asked to come to bedside to further discuss goals of care with [name] [facility]). He would like to transition to comfort care at this time. He does not want her to suffer any longer. We have discussed the details of this transition and he is in agreement with the plan. ? ?Plan: ?- Continue hydromorphone infusion with PRN bolus ?- Wean to extubate per protocol once comfort goal achieved per RT protocol ?- Comfort care per CCM protocol ? ? ? ?Georgann Housekeeper, AGACNP-BC ?[facility] ? ?See Amion for personal pager ?PCCM on call pager [phone] until 7pm. ?Please call Elink 7p-7a. [phone] ? ?11/09/2021 5:25 PM ? ? ? ? ?

## 2021-11-07 NOTE — Progress Notes (Signed)
? ?NAME:  Judy Meyer, MRN:  [id number], DOB:  1961-04-17, LOS: 9 ?ADMISSION DATE:  09/25/2021, CONSULTATION DATE:  10/12/21 ?REFERRING MD:  Gilles Chiquito, MD, CHIEF COMPLAINT:  BRBPR  ? ?History of Present Illness:  ?61 year old admitted with PMHx of alcohol abuse and liver disease, alcohol hepatitis presented to the emergency room on 2/25 with marked jaundice, with limited response to steroids.  She was diagnosed with acute cholecystitis. General surgery, gastroenterology and IR were consulted.  On the night between 10/10/2021 and 10/25/2021 patient started having bright red blood per rectum and she started became hypotensive.  Critical care was consulted for transferred to the ICU. She required several units of PRBC and FFP to be transfused. Flex sig done on 10/10/2021 did not show source of bleeding; nor did CTA or nuc med study. Once stabilized perc cholecystoscopy placed on 10/12/2021. Primary team trending hemoglobin.  Patient scheduled for an EGD on 10/29/2021. ? ? ?Pertinent  Medical History  ?HTN, hypothyroidism, H63D genetic mutation for hereditary hemochromatosi, nephrolithiasis, ETOH abuse, liver disease, alcohol hepatitis, uterine fibroids ? ?Significant Hospital Events: ?Including procedures, antibiotic start and stop dates in addition to other pertinent events   ?2/25: Admitted with working diagnosis of acute alcoholic hepatitis with elevated AST and ALT as well as alk phos.  Total bilirubin 33.5.  Ultrasound of abdomen obtained showed, pericholecystic fluid and gallbladder wall thickening.  Last drink 24 hours prior to presentation.   ?2/26: Seen by both GI and general surgery.  They felt this was primarily just acute liver failure and unlikely infection but recommended continuing antibiotics.  Also seen by gastroenterology they started prednisolone as well as lactulose and rifaximin critical care asked to evaluate given severe metabolic acidosis ?2/27 T bili going down, Cr a bit better, alert,  interactive, still with element of HE on exam. Critical care was consulted due to multiple metabolic derangements. ?3/2 Worsening abdominal distention, repeat CT scan of the abdomen and pelvis performed ?3/3 Prednisone was stopped due to minimal improvement in [facility] 0.981 on day 7 ?3/4 Started having lower GI bleed with worsening hypotension.  Albumin given.  Transfused with 2 unit PRBC.  GI was consulted and patient was scoped (sigmoidoscopy) re: hematochezia. ICU was consulted overnight for transfer to ICU. Blood noted int he rectum and the sigmoid colon. There is hemorrhoidal prolapse, but no evidence of bleeding from the hemorrhoids. ?3/5 Received 2 units of PRBC and 2 units FFP.  ?3/6 Cholecystotomy tube placed. Hgb 6.0 received 2 units of PRBC. INR 2.1 received 1 unit of FFP ?3/7 Lactic acid 2.9. Potassium 3.3 and Scr 2.79 Ca 6.7. Received bolus with 25% albumin 25gm. Potassium was replaced. Calcium was replaced. EGD completed with no signs of portal HTN, mild non-specific distal gastritis and hydropic gallbladder. L fem hematoma expanded. Vascular consulted.  ? ?Interim History / Subjective:  ?Complaining of right sided abdominal pain and nausea. ? ?Objective   ?Blood pressure (!) 128/50, pulse 83, temperature (!) 97.3 ?F (36.3 ?C), resp. rate 18, height $RemoveBe'5\' 6"'tZeZmgflq$  (1.676 m), weight 78.1 kg, last menstrual period 06/11/2018, SpO2 100 %. ?   ?  ? ?Intake/Output Summary (Last 24 hours) at 10/12/2021 0723 ?Last data filed at 10/12/2021 0600 ?Gross per 24 hour  ?Intake 2237.34 ml  ?Output 230 ml  ?Net 2007.34 ml  ? ?Filed Weights  ? 10/04/21 7124 11/04/2021 0255  ?Weight: 63.7 kg 78.1 kg  ? ?Examination: ?General: 60yoF resting comfortably ?HENT: NCAT no JVD ?Lungs: clear diminished in bases ?Cardiovascular:  NSR no ectopy ?Abdomen: distended, round tender to palpation RU/RL quadrant. Cholecystostomy tube with dark red bloody drainage.  ?Extremities: warm dry. BLE +1 pitting edema. Sluggish capillary refill in BLE.  Hematoma noted to Left groin, with ecchymosis extending to the groin, abdomen, and flank.  ?Neuro: alert and oriented, somewhat groggy post anesthesia ?GU: foley  ? ?Resolved Hospital Problem list   ? ? ?Assessment & Plan:  ?  ?Acute liver failure secondary to known alcoholic liver disease with ongoing alcohol use.  Has mutation raising concern for possible hereditary hemochromatosis.  ?Hepatic encephalopathy: ammonia 106 on admission ?Chronic diarrhea ?CT Abd w/w/o contrast revealed Gallbladder sludge, with wall thickening, Pericholecystic fluid and positive Murphy sign. Hepatic dysfunction. Diffuse liver attenuation consistent with the fatty replacement. Reversed flow in the hepatic portal vein. Massive distention of the gallbladder with tiny calcified gallstone. Small volume ascites ?- Failed prednisone ?- Gastroenterology following ?- Continue Lactulose ?- Continue Rifaximin  ?- Continue Hydromorphone PRN re: abd pain ? ?Acute cholecystitis: s/p cholecystostomy drain 3/6 ?RU/RL Quadrant abdominal pain with  ?- Drain per IR, appreciate their assistance.  ?- Continue zosyn pending drain cultures ?- Consider antifungal coverage ?- BCx NGTD  ?-Surgical cultures pending ?-Re-collet Urine culture pending ? ?Acute blood loss anemia secondary to GI bleed source not identified of flex sig, EGD, CTA, or tagged RBC study.  ?Anemia of chronic disease ?L groin hematoma at site of central line, expanding.  ?Plan ?Trend CBC ?Transfuse PRBC PRN ?Continue Octreotide ?Continue Norepi gtt to keep MAP > 85 ?Transfuse FFP  ?DDAVP 20 mcg given ?Tranexamic acid 1000 mg given ?Vascular surgery consulted re: eval Left groin hematoma. New line placed. Will remove L fem line and vascular will evaluate with imaging. ? ?Acute anion gap metabolic acidosis 2/2 acute liver disease, likely worsening acute alcoholic hepatitis c/b acute cholecystitis . Non anion gap acidosis 2/2 diarrhea and fluid loss ?Lactic acid down trending 3/6 (2.2) but now  up trending 3/7 (2.9) ?Plan ?Trend lactic acid PRN ?Continue sodium bicarbonate tablets ? ?Thrombocytopenia 2/2 Acute liver failure ?Plan ?Continue to trend PLT ? ?ETOH abuse ?Plan ?Telemetry monitor  ?Continue multi vit/ folic acid/thiamine ?Continue CIWA protocol ? ?AKI: Scr 2.79 elevated from baseline 1.02 6 mos ago. Currently trending up. Unclear etiology: ATN vs HRS ?Plan ?Treat for HRS with MAP goal 85. On norepi. ?Trend Scr ?Avoid nephrotoxic medications ?Renal dose medications ? ?Hypoglycemia ?Plan  ?Started on D10 ? ?Fluid and electrolyte abnormalities ?Hyponatremia (131) ?Hypokalemia (3.3) ?Hypocalcemia (6.7) ?Plan ?Replace electrolytes PRN ?Potassium and calcium replaced  ?Repeat BMP pending ? ?Hx of seizure disorder ?Plan ?Continue home [place]  ? ?Hx of hypothyroidism ?Plan ?Continue home Synthroid ? ?Palliative Care ?Plan: Goals of Care ? ?Best Practice (right click and "Reselect all SmartList Selections" daily)  ? ?Diet/type: NPO ?DVT prophylaxis: not indicated ?GI prophylaxis: PPI ?Lines: Central line, Arterial Line, and yes and it is still needed ?Foley:  Yes, and it is still needed ?Code Status:  full code ?Last date of multidisciplinary goals of care discussion $RemoveBeforeD'[ ]'MtmaErhRqfusSV$  ? ?Critical care time:  65 minutes independent of procedures necessary due to hemorrhagic shock, abla, acute liver failure ?  ? ? ? ?Georgann Housekeeper, AGACNP-BC ?[facility] ? ?See Amion for personal pager ?PCCM on call pager [phone] until 7pm. ?Please call Elink 7p-7a. [phone] ? ?11/10/21 12:37 PM ? ? ? ? ? ? ?

## 2021-11-07 NOTE — Anesthesia Postprocedure Evaluation (Signed)
Anesthesia Post Note ? ?Patient: LAINY WROBLESKI ? ?Procedure(s) Performed: ESOPHAGOGASTRODUODENOSCOPY (EGD) WITH PROPOFOL ? ?  ? ?Patient location during evaluation: PACU ?Anesthesia Type: MAC ?Level of consciousness: awake ?Pain management: pain level controlled ?Vital Signs Assessment: post-procedure vital signs reviewed and stable ?Respiratory status: spontaneous breathing, nonlabored ventilation, respiratory function stable and patient connected to nasal cannula oxygen ?Cardiovascular status: stable and blood pressure returned to baseline ?Postop Assessment: no apparent nausea or vomiting ?Anesthetic complications: no ? ? ?No notable events documented. ? ?Last Vitals:  ?Vitals:  ? 10-31-2021 1800 2021/10/31 1807  ?BP:    ?Pulse: 64 62  ?Resp: (!) 25 (!) 29  ?Temp:    ?SpO2: 97% (!) 78%  ?  ?Last Pain:  ?Vitals:  ? 2021-10-31 1313  ?TempSrc: Bladder  ?PainSc:   ? ? ?  ?  ?  ?  ?  ?  ? ?Arpi Diebold P Mikeyla Music ? ? ? ? ?

## 2021-11-07 NOTE — Progress Notes (Addendum)
New CVC in with some bleeding, original CVC removed per vascular surgery's recommendations. TXA given, giving 2 more FFP. TEG ordered. Sudden onset SOB, tachypnea, anxiety. New rhonchi on exam. Bleeding in her mouth but adequate cough and throat clearing. Trial of precedex for agitation, but seems very close to needing intubation. [name] called by NP Heber Blossom to update him on the situation and let him know this would be a significant change in trajectory for her care, but he remains hopeful despite our concern that this will be a terminal illness. This would likely be a difficult intubation due to contamination of her airway with blood and high risk for aspiration with gastric outlet obstruction seen on EGD today. She pulled out her NGT already this morning. At this point it may be equally risky to try to replace NGT prior to intubation due to risk of inducing vomiting or bleeding. ? ?Julian Hy, DO 11/11/21 1:04 PM ?[facility] ? ? ?Reassessed-- cough sounding more wet, still very tachypneic and agitated. Concerned for inability to control secretions and aspiration. Discussed with team, planning to proceed with intubation. ? ?Julian Hy, DO Nov 11, 2021 1:28 PM ?[facility] ? ?

## 2021-11-07 NOTE — Op Note (Signed)
[facility] ?Patient Name: Judy Meyer ?Procedure Date : 2021/11/06 ?MRN: [id number] ?Attending MD: [name] , MD ?Date of Birth: 1961/03/04 ?CSN: [id number] ?Age: 61 ?Admit Type: Inpatient ?Procedure:                Upper GI endoscopy ?Indications:              Hematochezia, flex sig 2 days ago showed no clear  ?                          etiology. Currently with severe Etoh Hepatitis; on  ?                          levophed max dose. Underwent perc gallbladder drain  ?                          yesterday via IR for presumed cholecystitis; NG  ?                          tube in place ?Providers:                [name], MD, Dulcy Fanny, [place]  ?                          [place], Technician, Hale Bogus, CRNA ?Referring MD:              ?Medicines:                Monitored Anesthesia Care ?Complications:            No immediate complications. Estimated blood loss:  ?                          None. ?Estimated Blood Loss:     Estimated blood loss: none. ?Procedure:                Pre-Anesthesia Assessment: ?                          - Prior to the procedure, a History and Physical  ?                          was performed, and patient medications and  ?                          allergies were reviewed. The patient's tolerance of  ?                          previous anesthesia was also reviewed. The risks  ?                          and benefits of the procedure and the sedation  ?                          options and risks were discussed with the patient.  ?  All questions were answered, and informed consent  ?                          was obtained. Prior Anticoagulants: The patient has  ?                          taken no previous anticoagulant or antiplatelet  ?                          agents. ASA Grade Assessment: IV - A patient with  ?                          severe systemic disease that is a constant threat  ?                          to life. After  reviewing the risks and benefits,  ?                          the patient was deemed in satisfactory condition to  ?                          undergo the procedure. ?                          After obtaining informed consent, the endoscope was  ?                          passed under direct vision. Throughout the  ?                          procedure, the patient's blood pressure, pulse, and  ?                          oxygen saturations were monitored continuously. The  ?                          [id number]-[id number] ([id number]) Olympus endoscope was introduced  ?                          through the mouth, and advanced to the duodenal  ?                          bulb. The upper GI endoscopy was accomplished  ?                          without difficulty. The patient tolerated the  ?                          procedure well. ?Scope In: ?Scope Out: ?Findings: ?     NG tube in good position. ?     No esophgaeal or gastric varices. ?     Mild, non-specific distal gastritis. Not biopsied. ?     There was a rounded indentation into the distal aspect of the duodenal  ?  bulb, causing significant anatomic tortuousity, narrowing of the  ?     duodenal lumen. I could not advance the gastroscope distal to this site.  ?     On review of recent CT scans it is very clear that this stenosis is  ?     caused by her hydropic gallbladder. ?     The exam was otherwise without abnormality. ?Impression:               - No signs of portal hypertension. ?                          - Mild, non-specific distal gastritis. ?                          - Hydropic gallbladder is causing significant  ?                          anatomic distortion of the distal duodenal bulb,  ?                          creating at least partial gastric outlet  ?                          obstruction. Hopefully this will improve with time  ?                          since she had perc GB drain placed yesterday. I  ?                          think NG tube should remain in place  for now. ?                          - Her current MELD-Na score is 21. She is on max  ?                          levophed pressor. Obviously her condition is still  ?                          quite guarded. ?Recommendation:           - Will follow along. ?Procedure Code(s):        --- Professional --- ?                          [phone], Esophagogastroduodenoscopy, flexible,  ?                          transoral; diagnostic, including collection of  ?                          specimen(s) by brushing or washing, when performed  ?                          (separate procedure) ?Diagnosis Code(s):        --- Professional --- ?  K29.70, Gastritis, unspecified, without bleeding ?                          K92.1, Melena (includes Hematochezia) ?CPT copyright 2019 American Medical Association. All rights reserved. ?The codes documented in this report are preliminary and upon coder review may  ?be revised to meet current compliance requirements. ?[name], MD ?October 21, 2021 8:18:35 AM ?This report has been signed electronically. ?Number of Addenda: 0 ?

## 2021-11-07 NOTE — Progress Notes (Signed)
Nutrition Follow-up ? ?DOCUMENTATION CODES:  ? ?Non-severe (moderate) malnutrition in context of chronic illness ? ?INTERVENTION:  ? ?- If within [place], recommend initiation of TPN as pt has been without adequate nutrition since admission (x 9 days) and inability to start tube feeds ? ?NUTRITION DIAGNOSIS:  ? ?Moderate Malnutrition related to chronic illness (alcohol associated hepatitis) as evidenced by mild fat depletion, moderate muscle depletion, percent weight loss (18% weight loss in the last 6 months). ? ?Ongoing ? ?GOAL:  ? ?Patient will meet greater than or equal to 90% of their needs ? ?Unmet ? ?MONITOR:  ? ?Supplement acceptance, Labs, Weight trends, Skin, PO intake ? ?REASON FOR ASSESSMENT:  ? ?Consult ?Assessment of nutrition requirement/status ? ?ASSESSMENT:  ? ?Pt is a 61 year old female with medical history significant of alcohol use disorder, HTN, hypothyroidism, seizures, anemia, hyperlipidemia, alcohol associated hepatitis followed by [facility] hepatology who presented to the ED with abdominal pain and jaundice and was admitted for alcoholic hepatitis. ? ?02/25 - diet advanced to clear liquids ?02/26 - diet advanced to full liquids  ?02/28 - diet advanced to soft ?03/05 - transferred to ICU, s/p flexible sigmoidoscopy showing frank blood without a source identified ?03/06 - s/p percutaneous cholecystostomy catheter placement ?03/07 - s/p EGD showing gastritis and hydropic gallbladder causing significant anatomic distortion of the distal duodenal bulb creating at least partial gastric outlet obstruction, intubated ? ?Discussed pt with RN and during ICU rounds. Pt pulled NG tube out but this was replaced prior to intubation this afternoon. Current NG tube tip in stomach. KUB showing dilation of small bowel loops suggesting ileus or SBO. No plans for tube feeds today. Per notes, pt with poor prognosis. ? ?If within [place], recommend initiation of TPN as pt has been without adequate nutrition since admission (x  9 days) and inability to start tube feeds. ? ?Admit weight: 63.7 kg ?Current weight: 78.1 kg ? ?Pt with mild pitting edema to RUE and non-pitting edema to LUE and BLE. ? ?Patient is currently intubated on ventilator support ?MV: 11.2 L/min ?Temp (24hrs), Avg:97.4 ?F (36.3 ?C), Min:96.3 ?F (35.7 ?C), Max:98.6 ?F (37 ?C) ?BP (a-line): 151/75 ?MAP (a-line): 104 ? ?Drips: ?Precedex ?Dilaudid ?Levophed ?Vasopressin ?Octreotide ?D10: 30 ml/hr ? ?Medications reviewed and include: colace, folic acid, lactulose 20 grams TID per tube, MVI with minerals, IV protonix, miralax, sodium bicarb 1300 mg BID, thiamine, IV albumin, IV eraxis, IV calcium gluconate 2 grams once, IV abx ? ?Labs reviewed: sodium 134, BUN 35, creatinine 2.88, ionized calcium 0.92, elevated LFTs, lactic acid 2.9, WBC 34.0, hemoglobin 6.2, platelets 120 ?CBG's: 68-132 x 24 hours ? ?UOP: 320 ml x 24 hours ?RUQ drain: 40 ml x 24 hours ?I/O's: +12.3 L since admit ? ?Diet Order:   ?Diet Order   ? ?       ?  Diet NPO time specified  Diet effective now       ?  ? ?  ?  ? ?  ? ? ?EDUCATION NEEDS:  ? ?Not appropriate for education at this time ? ?Skin:  Skin Assessment: Reviewed RN Assessment (MASD sacrum and buttocks) ? ?Last BM:  10/12/21 ? ?Height:  ? ?Ht Readings from Last 1 Encounters:  ?12-Nov-2021 '5\' 6"'$  (1.676 m)  ? ? ?Weight:  ? ?Wt Readings from Last 1 Encounters:  ?10/24/2021 78.1 kg  ? ? ?Ideal Body Weight:  59.1 kg ? ?BMI:  Body mass index is 27.79 kg/m?. ? ?Estimated Nutritional Needs:  ? ?Kcal:  2000-2200 ? ?  Protein:  100-115 grams ? ?Fluid:  >/= 2 L ? ? ? ?Gustavus Bryant, MS, RD, LDN ?Inpatient Clinical Dietitian ?Please see AMiON for contact information. ? ?

## 2021-11-07 NOTE — Progress Notes (Addendum)
?                                                   ?Palliative Care Progress Note, Assessment & Plan  ? ?Patient Name: Judy Meyer       Date: October 21, 2021 ?DOB: 1960-12-06  Age: 61 y.o. MRN#: [id number] ?Attending Physician: Julian Hy, DO ?Primary Care Physician: Pcp, No ?Admit Date: 10/02/2021 ? ?Reason for Consultation/Follow-up: Establishing goals of care ? ?Subjective: ?Patient is lying in bed in no apparent distress.  She acknowledges my presence by opening her eyes but does not make any verbalizations. No family at bedside. ? ?HPI: ?61 y.o. female  with past medical history of Etoh abuse, HTN, hypothyroidism, liver disease, and history of seizures admitted on 09/29/2021 with acute liver failure d/t alcoholic hepatitis and acute cholecystitis.  ?  ?3/4, pt had red blood from rectum with hypotension. CCM and GI were consulted and pt transferred to ICU. Pt has received total 3 unit PRBCs. Flex sig and further imaging did not reveal source of bleeding.  ?  ?3/5, pt receiving 2 units FFP, percutaneous cholecystostomy tube placement planned for today, and EGD planned for tomorrow.  ? ?3/7, EGD completed and no source of bleeding identified. Vascular consulted for eval of hematoma. ? ?Summary of counseling/coordination of care: ?After reviewing the patient's chart, I assessed the patient at bedside.  Significant other [name] not present today.   ? ?Discussed with nurse patient's current health status. As per nurse, patient removed NG tube approximately 30 minutes ago.  Patient completed EGD without complication.  Patient remains extremely jaundiced and mentation waxes and wanes. ? ?Update: 1320: ?As per notes, CCM has updated patient's significant other [name] by phone regarding sudden on set of SOB, tachypnea, and anxiety. I spoke with [name] over the phone to offer support and  answer questions or concerns. He said he had ot get out of the hospital but was appreciative of everything the team is doing for Judy Meyer. He was appropriately tearful. Therapuetic silence and active listening offered. ? ?PMT will shadow patient's chart and intervene at medical team or family's request. ? ?Code Status: ?DNR ? ?Prognosis: ?Unable to determine ? ?Discharge Planning: ?To Be Determined ? ?Recommendations/Plan: ?Treat the treatable ?Full code remains ?Vascular surgery recs for hematoma ? ?Care plan was discussed with nursing ? ?Physical Exam ?Vitals and nursing note reviewed.  ?Constitutional:   ?   Appearance: She is ill-appearing.  ?HENT:  ?   Head: Normocephalic and atraumatic.  ?Cardiovascular:  ?   Rate and Rhythm: Normal rate.  ?Pulmonary:  ?   Effort: Pulmonary effort is normal.  ?Abdominal:  ?   General: There is distension.  ?Skin: ?   General: Skin is warm.  ?   Coloration: Skin is jaundiced.  ?Neurological:  ?   Mental Status: She is alert.  ?Psychiatric:     ?   Mood and Affect: Mood normal. Mood is not anxious.     ?   Behavior: Behavior normal.  ?         ? ?Palliative Assessment/Data: 40% ? ? ? ?Total Time 25 minutes  ?Greater than 50%  of this time was spent counseling and coordinating care related to the above assessment and plan. ? ?Thank you for allowing the Palliative Medicine Team to assist in the care of  this patient. ? ?Verdell Carmine. Ezariah Nace, DNP, FNP-BC ?Palliative Medicine Team ?Team Phone # [phone] ?  ?

## 2021-11-07 NOTE — Progress Notes (Signed)
100 cc dilaudid wasted, witnessed by charge RN [employer]. ?

## 2021-11-07 NOTE — Anesthesia Preprocedure Evaluation (Addendum)
Anesthesia Evaluation  ?Patient identified by MRN, date of birth, ID band ?Patient awake ? ? ? ?Reviewed: ?Allergy & Precautions, NPO status , Patient's Chart, lab work & pertinent test results ? ?Airway ?Mallampati: III ? ?TM Distance: >3 FB ?Neck ROM: Full ? ? ? Dental ?no notable dental hx. ? ?  ?Pulmonary ?neg pulmonary ROS,  ?  ?Pulmonary exam normal ?breath sounds clear to auscultation ? ? ? ? ? ? Cardiovascular ?hypertension, Normal cardiovascular exam ?Rhythm:Regular Rate:Normal ? ? ?  ?Neuro/Psych ?Seizures -,  PSYCHIATRIC DISORDERS Depression  Neuromuscular disease   ? GI/Hepatic ?negative GI ROS, (+)  ?  ? substance abuse ? , Hepatitis -Jaundice  ?  ?Endo/Other  ?Hypothyroidism  ? Renal/GU ?Renal disease  ? ?  ?Musculoskeletal ?negative musculoskeletal ROS ?(+)  ? Abdominal ?  ?Peds ? Hematology ? ?(+) Blood dyscrasia, anemia , Thrombocytopenia    ?Anesthesia Other Findings ?GI bleed.  severe acute liver dz in setting ETOH, hemochromatosis gene mutation. blood loss anemia. ? Reproductive/Obstetrics ? ?  ? ? ? ? ? ? ? ? ? ? ? ? ? ?  ?  ? ? ? ? ? ? ? ?Anesthesia Physical ?Anesthesia Plan ? ?ASA: 4 ? ?Anesthesia Plan: MAC  ? ?Post-op Pain Management:   ? ?Induction: Intravenous ? ?PONV Risk Score and Plan: 2 and Propofol infusion and Treatment may vary due to age or medical condition ? ?Airway Management Planned: Nasal Cannula ? ?Additional Equipment:  ? ?Intra-op Plan:  ? ?Post-operative Plan:  ? ?Informed Consent: I have reviewed the patients History and Physical, chart, labs and discussed the procedure including the risks, benefits and alternatives for the proposed anesthesia with the patient or authorized representative who has indicated his/her understanding and acceptance.  ? ? ? ?Dental advisory given ? ?Plan Discussed with: CRNA ? ?Anesthesia Plan Comments: (Right axillary arterial line ?Left femoral central line )  ? ? ? ? ? ? ?Anesthesia Quick Evaluation ? ?

## 2021-11-07 NOTE — Progress Notes (Addendum)
eLink Physician-Brief Progress Note ?Patient Name: Judy Meyer ?DOB: 12-29-1960 ?MRN: [id number] ? ? ?Date of Service ? October 21, 2021  ?HPI/Events of Note ? Multiple issues: 1. Lactic Acid = 2.9. Likely d/t severe liver dysfunction. Patient scheduled to get albumin dose now. 2. Hypokalemia - K+ = 3.3 and Creatinine = 2.79. 3. Hypocalcemia - Ca++ = 6.7  ?eICU Interventions ? Plan: ?Bolus with 25% albumin 25 gm as already ordered. ?Cautiously replace K+. ?Will replace Ca++. ?Repeat BMP at 12 noon.  ? ? ? ?Intervention Category ?Major Interventions: Acid-Base disturbance - evaluation and management ? ?Arion Morgan Cornelia Copa ?21-Oct-2021, 4:59 AM ?

## 2021-11-07 NOTE — Progress Notes (Signed)
PCCM INTERVAL PROGRESS NOTE ? ? ?Spoke at length with the patient's medical decision maker and boyfriend [name] before and after intubation. He is aware of the severity of her illness. He understands her prognosis is poor and she is unlikely to survive the admission. He is hopeful there is still a chance of recovery even if slim. He gave the OK for intubation. After intubation he came to the bedside. After discussion he would prefer to enact a DNR at this time, but continue with aggressive medical interventions as appropriate. He understands there is not much more to offer her from a medical standpoint and should she worsen further he will likely consider comfort care. ? ? ?Georgann Housekeeper, AGACNP-BC ?[facility] ? ?See Amion for personal pager ?PCCM on call pager [phone] until 7pm. ?Please call Elink [phone]-[phone]. [phone] ? ?10-17-2021 2:09 PM ? ? ? ? ? ?

## 2021-11-07 NOTE — Death Summary Note (Signed)
DEATH SUMMARY   Patient Details  Name: Judy Meyer MRN: [id number] DOB: 02-28-1961  Admission/Discharge Information   Admit Date:  11-01-21  Date of Death: Date of Death: 11/11/2021  Time of Death: Time of Death: 11-20-42  Length of Stay: 11-15-2022  Referring Physician: Pcp, No   Reason(s) for Hospitalization  Alcoholic hepatitis  Diagnoses  Preliminary cause of death:  Secondary Diagnoses (including complications and co-morbidities):  Principal Problem:   Alcoholic hepatitis Active Problems:   Hypokalemia   Hyperbilirubinemia   Acidosis, metabolic   Leukocytosis   Lactic acidosis   Acute liver failure with hepatic coma (HCC)   Abnormal gallbladder ultrasound   Subacute liver failure without hepatic coma   Abdominal pain   Sepsis ([place])   Hemorrhagic shock (HCC)   Septic shock (HCC)   Acute blood loss anemia   Gastrointestinal hemorrhage   Hepatorenal syndrome (HCC)   Problem with vascular access  Sepsis due to acute cholecystitis  Gastric outlet obstruction due to cholecystitis Decompensated cirrhosis Acute GI bleed, source not identified Acute respiratory failure with hypoxia Coagulopathy due to liver disease Thrombocytopenia Thigh hematoma due to vascular catheter AKI, hepatorenal syndrome Hypothyroidism Hypoglycemia malnutrition Hyponatremia Hypocalcemia    Brief Hospital Course (including significant findings, care, treatment, and services provided and events leading to death)  LORY NOWACZYK is a 61 y.o. year old female PMHx of alcohol abuse and liver disease, alcohol hepatitis presented to the emergency room on 2022-11-01 with marked jaundice, with limited response to steroids.  She was diagnosed with acute cholecystitis. General surgery, gastroenterology and IR were consulted.  On the night between 10/10/2021 and 10/18/2021 patient started having bright red blood per rectum and she started became hypotensive.  Critical care was consulted for transferred to the ICU.  She required several units of PRBC and FFP to be transfused. Flex sig done on 10/10/2021 did not show source of bleeding; nor did CTA or nuc med study. Once stabilized perc cholecystoscopy placed on 10/12/2021. Primary team trending hemoglobin.  Patient scheduled for an EGD on 11-11-21.  She was moved to the ICU and underwent flex-sig and EGD that did not demonstrate a source of bleeding. She underwent IR placement of a percutaneous cholecystostomy drain. Cultures positive for candida albicans. She was treated with antibiotics and antifungals were added based on her culture results concerning for fungus. She was given blood products for resuscitation- RBC, FFP, platelets, ase well as TXA, DDAVP.  She developed a hematoma around her femoral CVC. Vascular surgery was consulted to help manage this. New access was placed and this line removed in addition to blood products given to reverse coagulopathy.   AKI was managed with HRS treatment- volume expansion with albumin, vasopressors to raise MAP to 85.   She developed respiratory failure with hypoxia spontaneously associated with bleeding from her mouth. She was developing worsening anxiety, more trouble protecting her airway, and worsening rhonchi. After discussion with her POA the decision was made to proceed with intubation for airway protection. Several hours later her POA made the decision to terminate aggressive care measures and focus on comfort. She passed away on Nov 11, 2021 at 18:44.    Pertinent Labs and Studies  Significant Diagnostic Studies DG Abd 1 View  Result Date: 2021-11-11 CLINICAL DATA:  Placement of enteric tube EXAM: ABDOMEN - 1 VIEW COMPARISON:  None. FINDINGS: There is dilation of small-bowel loops measuring up to 4.8 cm. Tip of enteric tube is seen in the stomach. There is a pigtail catheter in  the right side of abdomen, possibly cholecystostomy catheter. IMPRESSION: Tip of enteric tube is seen in the stomach. There is dilation of  small-bowel loops suggesting ileus or partial small bowel obstruction. Cholecystostomy catheter is seen in the right side of abdomen. Electronically Signed   By: Elmer Picker M.D.   On: 2021/11/05 14:17   CT ABDOMEN W CONTRAST  Result Date: 10/09/2021 CLINICAL DATA:  Jaundice. EXAM: CT ABDOMEN WITH CONTRAST TECHNIQUE: Multidetector CT imaging of the abdomen was performed using the standard protocol following bolus administration of intravenous contrast. RADIATION DOSE REDUCTION: This exam was performed according to the departmental dose-optimization program which includes automated exposure control, adjustment of the mA and/or kV according to patient size and/or use of iterative reconstruction technique. CONTRAST:  66m OMNIPAQUE IOHEXOL 300 MG/ML  SOLN COMPARISON:  07/25/2021 FINDINGS: Lower chest: Bilateral dependent collapse/consolidative disease noted in both lungs with small bilateral pleural effusions. Hepatobiliary: Marked hepatomegaly with nodular, diffusely decreased attenuation of liver parenchyma. Gallbladder is massively distended measuring 6.5 cm diameter. Tiny calcified gallstone identified towards the neck. No intrahepatic or extrahepatic biliary dilation. Pancreas: No focal mass lesion. No dilatation of the main duct. No intraparenchymal cyst. No peripancreatic edema. Spleen: No splenomegaly. No focal mass lesion. Adrenals/Urinary Tract: No adrenal nodule or mass. 2.6 cm fat density lesion posterior lower interpolar right kidney is stable compatible with angiomyolipoma small nonobstructing stone again noted lower pole right kidney. Left kidney unremarkable. Stomach/Bowel: Stomach is unremarkable. No gastric wall thickening. No evidence of outlet obstruction. Duodenum is normally positioned as is the ligament of Treitz. No small bowel dilatation within the visualized abdomen. Abdominal segments of the colon are filled with gas and fluid, demonstrating mild distension. Curvilinear focus of  gas projects in the fluid of the right paracolic gutter but is within a nondilated appendix. Vascular/Lymphatic: There is mild atherosclerotic calcification of the abdominal aorta without aneurysm. There is no gastrohepatic or hepatoduodenal ligament lymphadenopathy. No retroperitoneal or mesenteric lymphadenopathy. Other: Small volume perihepatic and perisplenic fluid associated with fluid layering in both para colic gutters. Musculoskeletal: No worrisome lytic or sclerotic osseous abnormality. IMPRESSION: 1. Marked hepatomegaly with nodular, diffusely decreased attenuation of liver parenchyma. This represents a marked change since 07/25/2021. Portal and hepatic venous anatomy in the liver parenchyma shows areas of mass effect but does not appear markedly distorted. Imaging features may reflect marked progression of fatty deposition within the liver parenchyma, but given the nodular parenchyma, cirrhotic disease, innumerable metastases, or infiltrative process cannot be excluded. MRI abdomen with and without contrast may provide additional characterization. 2. Massive distention of the gallbladder with tiny calcified gallstone. 3. Small volume ascites. 4. Bilateral dependent collapse/consolidative disease in both lungs with small bilateral pleural effusions. 5. 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 6. Aortic Atherosclerosis (ICD10-I70.0). Electronically Signed   By: EMisty StanleyM.D.   On: 10/09/2021 06:08   NM GI Blood Loss  Result Date: 11/01/2021 CLINICAL DATA:  GI hemorrhage for 1 day with negative CTA EXAM: NUCLEAR MEDICINE GASTROINTESTINAL BLEEDING SCAN TECHNIQUE: Sequential abdominal images were obtained following intravenous administration of Tc-972mabeled red blood cells. RADIOPHARMACEUTICALS:  21.4 mCi [id number] in-vitro labeled red cells. COMPARISON:  CTA from earlier in the same day. FINDINGS: Injection is noted via a left femoral central line. Normal vascular activity is  identified. Minimal pooling is noted in the urinary bladder. No focal area of increased activity with mobility is noted to suggest acute GI hemorrhage. IMPRESSION: No findings to suggest acute GI hemorrhage. Electronically Signed  By: Inez Catalina M.D.   On: 10/30/2021 20:02   US Abdomen Complete  Result Date: 09/29/2021 CLINICAL DATA:  Acute on chronic liver disease with jaundice, earlier study today showing gallbladder thickening and positive sonographic Murphy's sign. EXAM: ABDOMEN ULTRASOUND COMPLETE COMPARISON:  The CT with IV contrast 07/25/2021. FINDINGS: Gallbladder: There is hypoechoic layering sludge without visible shadowing stones. There is trace pericholecystic fluid. The free wall is thickened up to 8.1 mm and there is positive right upper abdominal tenderness concerning for acute cholecystitis. The wall thickening and positive right upper abdominal tenderness could alternatively have a basis in hepatic dysfunction and/or hepatitis or other adjacent inflammatory process. Wall thickening was not seen on the 07/25/2021 CT. Common bile duct: Diameter: Prominent measuring 8.1 mm, but previously measured 7 mm. No intrahepatic biliary dilatation. Liver: No focal lesion identified through the patient's steatosis. The liver is diffusely attenuating consistent with fatty replacement, with moderate to severe fatty replacement noted on the CT. The liver length was not measured today but was previously 17 cm. Portal vein is patent on color Doppler imaging with reversal of flow. It is otherwise not well seen today but previously measured prominent at 14 mm. IVC: No abnormality visualized. Pancreas: Body of the pancreas is visible and unremarkable. The head, neck and tail segments are obscured by bowel gas. Spleen: Size and appearance within normal limits. Right Kidney: Length: 11.4 cm. Cortical echogenicity and thickness within normal limits. No hydronephrosis or stone visualized. Again noted is a 3.5 cm mass  posteriorly with uniform increased echogenicity consistent with the angiomyolipoma noted on the CT, lower pole. No other mass is seen. There was a 3 mm lower pole caliceal stone on the CT but it is not visible sonographically , possibly obscured by bowel gas shadowing or passed in the interval. Left Kidney: Length: 9.5 cm. Echogenicity within normal limits. No mass, stone or hydronephrosis visualized. Abdominal aorta: No aneurysm visualized. Other findings: No ascites. IMPRESSION: 1. Gallbladder sludge without shadowing stones, with wall thickening, pericholecystic fluid and positive sonographic Murphy sign. Findings suspicious for cholecystitis. With hepatic dysfunction, alternative etiology could be congestive or reactive thickening such as with hepatitis, or adjacent inflammatory process. 2. The pancreas mostly obscured but normal where visible. 3. Prominent common bile duct.  No intrahepatic biliary prominence. 4. Diffuse liver attenuation consistent with the fatty replacement noted on the CT. 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 6. Reversed flow in the hepatic portal vein. The portal vein was otherwise not well enough visualized to measure its diameter but it was previously slightly prominent. Electronically Signed   By: Telford Nab M.D.   On: 09/15/2021 22:45   MR LIVER WO CONRTAST  Result Date: 10/10/2021 CLINICAL DATA:  Liver failure, alcoholic hepatitis EXAM: MRI ABDOMEN WITHOUT CONTRAST TECHNIQUE: Multiplanar multisequence MR imaging was performed without the administration of intravenous contrast. Patient declined to complete the full examination, limited sequences provided. COMPARISON:  CT abdomen pelvis, 10/09/2021 FINDINGS: Incomplete examination provided for review is further significantly limited by breath motion artifact throughout. Lower chest: Small bilateral pleural effusions.  Cardiomegaly. Hepatobiliary: Severe hepatomegaly. No obvious mass or other discrete abnormality of the liver  parenchyma. Distended gallbladder again noted, likely containing sludge and tiny gallstones. No biliary ductal dilatation. Pancreas: No mass, inflammatory changes, or other parenchymal abnormality identified.No pancreatic ductal dilatation. Spleen:  Within normal limits in size and appearance. Adrenals/Urinary Tract: Normal adrenal glands. 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 No  renal masses or suspicious contrast enhancement identified. No evidence of hydronephrosis. Stomach/Bowel: Visualized portions within the abdomen are unremarkable. Vascular/Lymphatic: No pathologically enlarged lymph nodes identified. No abdominal aortic aneurysm demonstrated. Other:  Anasarca.  Small volume ascites throughout the abdomen. Musculoskeletal: No suspicious osseous lesions identified. IMPRESSION: 1. Incomplete examination provided for review is further significantly limited by breath motion artifact throughout. 2. Within this limitation, severe hepatomegaly. No obvious mass or other discrete abnormality of the liver parenchyma on noncontrast MR. 3. Distended gallbladder again noted, likely containing sludge and tiny gallstones. No biliary ductal dilatation. 4. Small volume ascites, pleural effusions, and anasarca. Electronically Signed   By: Delanna Ahmadi M.D.   On: 10/10/2021 13:22   IR Perc Cholecystostomy  Result Date: 11-11-21 CLINICAL DATA:  Sepsis, gallbladder hydrops, clinical diagnosis of acute cholecystitis EXAM: PERCUTANEOUS CHOLECYSTOSTOMY TUBE PLACEMENT WITH ULTRASOUND AND FLUOROSCOPIC GUIDANCE FLUOROSCOPY: Radiation Exposure Index (as provided by the fluoroscopic device): 10 mGy air Kerma TECHNIQUE: Survey ultrasound of the abdomen was performed and an appropriate skin entry site was identified. Skin site was marked, prepped with Betadine, and draped in usual sterile fashion, and infiltrated locally with 1% lidocaine. Intravenous versed '1mg'$  were  administered as anxiolytic during continuous monitoring of the patient's level of consciousness and physiological / cardiorespiratory status by the radiology RN. Under real-time ultrasound guidance, gallbladder was accessed using a transhepatic approach with a 21-gauge needle. Ultrasound image documentation was saved. Bile returned through the hub. Needle was exchanged over a 018 guidewire for transitional dilator which allowed placement of 035 J wire. Over this, a 10.2 French pigtail catheter was advanced and formed centrally in the gallbladder lumen. Small contrast injection confirmed appropriate position. Catheter secured externally with 0 Prolene suture and placed external drain bag. Patient tolerated the procedure well. COMPLICATIONS: COMPLICATIONS none IMPRESSION: 1. Technically successful percutaneous cholecystostomy tube placement with ultrasound and fluoroscopic guidance. Electronically Signed   By: Lucrezia Europe M.D.   On: Nov 11, 2021 10:04   Portable Chest x-ray  Result Date: 11/11/2021 CLINICAL DATA:  Difficulty breathing, placement of endotracheal tube EXAM: PORTABLE CHEST 1 VIEW COMPARISON:  10/12/2021 FINDINGS: There is poor inspiration. Transverse diameter of heart is increased. Tip of endotracheal tube is 1.7 cm above the carina. Enteric tube is noted traversing the esophagus. Increased markings are seen in both lower lung fields. This finding has not changed significantly. There is no pneumothorax. IMPRESSION: Poor inspiration. There are linear patchy infiltrates in both lower lung fields suggesting atelectasis/pneumonia. Electronically Signed   By: Elmer Picker M.D.   On: 11-11-2021 14:15   DG CHEST PORT 1 VIEW  Result Date: 10/12/2021 CLINICAL DATA:  Encounter for nasogastric (NG) tube placement Z46.59 (ICD-10-CM) EXAM: PORTABLE CHEST 1 VIEW COMPARISON:  October 11, 2021. FINDINGS: Is a gastric tube courses below the diaphragm with the tip in the expected region the stomach. Side port is  below the GE junction. Similar appearance of low lung volumes with bibasilar opacities. No visible pleural effusions or pneumothorax. Similar cardiomediastinal silhouette, accentuated by AP technique and low lung volumes. IMPRESSION: 1. Nasogastric tube in the stomach with side port below the GE junction. 2. Similar low lung volumes and bibasilar opacities, most likely atelectasis. Electronically Signed   By: Margaretha Sheffield M.D.   On: 10/12/2021 07:18   DG CHEST PORT 1 VIEW  Result Date: 10/22/2021 CLINICAL DATA:  Nasogastric catheter placement EXAM: PORTABLE CHEST 1 VIEW COMPARISON:  10/02/2021 FINDINGS: Cardiac shadow is stable. Gastric catheter is noted extending into the stomach. Mild  bibasilar atelectatic changes are noted new from the prior exam. No acute bony abnormality is noted. IMPRESSION: Gastric catheter within the stomach. Bibasilar atelectasis is noted. Electronically Signed   By: Inez Catalina M.D.   On: 11/03/2021 22:17   DG CHEST PORT 1 VIEW  Result Date: 09/30/2021 CLINICAL DATA:  Jaundice, abdominal pain and sepsis. EXAM: PORTABLE CHEST 1 VIEW COMPARISON:  PA Lat 05/14/2006. FINDINGS: The heart size and mediastinal contours are within normal limits. Both lungs are clear apart from linear scarring or atelectasis laterally in the left base. The visualized skeletal structures are unremarkable. IMPRESSION: No evidence of acute chest disease. Electronically Signed   By: Telford Nab M.D.   On: 09/16/2021 21:03   US LIVER DOPPLER  Result Date: 09/14/2021 CLINICAL DATA:  Jaundice, acute on chronic liver disease EXAM: DUPLEX ULTRASOUND OF LIVER TECHNIQUE: Color and duplex Doppler ultrasound was performed to evaluate the hepatic in-flow and out-flow vessels. COMPARISON:  CT 07/25/2021 FINDINGS: Liver: Diffusely increased parenchymal echogenicity with extremely poor acoustic through transmission in keeping with changes of severe hepatic steatosis. Assessment of the hepatic parenchyma is  resultantly markedly limited. Limited images of the hepatic contour demonstrate no obvious nodularity. No definite focal intrahepatic mass identified. Main Portal Vein size: 12 mm cm Portal Vein Velocities Main Prox:  -63 cm/sec Main Mid: -55 cm/sec Main Dist:  -79 cm/sec Right: -38 cm/sec Left: 35 cm/sec Hepatic Vein Velocities Right:  17 cm/sec Middle:  30 cm/sec Left:  24 cm/sec IVC: Patent Hepatic Artery Velocity:  67 cm/sec Splenic Vein Velocity:  14 cm/sec Spleen: 6.9 cm x 10.4 cm x 8.3 cm with a total volume of 313 cm^3 (411 cm^3 is upper limit normal) Portal Vein Occlusion/Thrombus: No Splenic Vein Occlusion/Thrombus: No Ascites: None Varices: None Superior mesenteric vein appears patent centrally. IMPRESSION: Markedly limited examination secondary to severe hepatic steatosis and resultant poor acoustic through transmission. Hepatofugal flow within the right portal vein and main portal vein. Preserved antegrade flow within the left portal vein. Splenic vein and superior mesenteric vein appear patent. Electronically Signed   By: Fidela Salisbury M.D.   On: 09/20/2021 22:42   CT ANGIO GI BLEED  Result Date: 11/04/2021 CLINICAL DATA:  61 year old female with blood and blood clots per rectum. History of liver failure. Alcoholic hepatitis. EXAM: CTA ABDOMEN AND PELVIS WITHOUT AND WITH CONTRAST TECHNIQUE: Multidetector CT imaging of the abdomen and pelvis was performed using the standard protocol during bolus administration of intravenous contrast. Multiplanar reconstructed images and MIPs were obtained and reviewed to evaluate the vascular anatomy. RADIATION DOSE REDUCTION: This exam was performed according to the departmental dose-optimization program which includes automated exposure control, adjustment of the mA and/or kV according to patient size and/or use of iterative reconstruction technique. CONTRAST:  17m OMNIPAQUE IOHEXOL 350 MG/ML SOLN COMPARISON:  Recent Abdomen MRI 10/10/2021, CT Abdomen  10/09/2021 FINDINGS: VASCULAR Normal abdominal aorta and its major branches. Minimal atherosclerosis at the aortoiliac bifurcation. Patent bilateral iliac arteries. No arterial aneurysm or dissection. Left femoral approach venous catheter tip terminates at the confluence of the left internal and external iliac veins. Patent portal venous system with mass effect on the intrahepatic portal veins and hepatic veins as demonstrated 2 days ago. Normal caliber IVC. Review of the MIP images confirms the above findings. NON-VASCULAR Lower chest: Stable small layering pleural effusions and lung base atelectasis. No pericardial effusion. Hepatobiliary: Severely abnormal liver, hepatomegaly, heterogeneous steatosis and enhancement unchanged from the recent CT and MRI. Massively distended gallbladder, volume estimated  at 200 mL. Some gallbladder vicarious contrast excretion suspected. No definite pericholecystic fluid. Pancreas: Indistinct pancreas with small volume ascites in the lesser sac appears stable since 10/09/2021. Acute pancreatitis would be difficult to exclude. No pancreatic ductal enlargement. No pancreatic atrophy. Spleen: No splenomegaly. Small volume perisplenic ascites. Adrenals/Urinary Tract: Nonobstructed kidneys, but with retention of contrast, ongoing contrast excretion on the initial noncontrast phase images. No hydroureter. Bladder decompressed by Foley catheter, with small volume of excreted IV contrast in the bladder. Incidental pelvic phleboliths. Right renal benign angiomyolipoma again noted. Stomach/Bowel: Featureless large and small bowel loops throughout the abdomen. Intermittent gas, retained stool, and also fluid in the large bowel. Flocculated stool type material also in the distal small bowel. Following IV contrast no extravasation into the bowel lumen is identified on the early arterial or on the delayed images. Enteric tube terminates in the distal stomach. No free air. Lymphatic: No  lymphadenopathy. Reproductive: Multiple uterine fibroids, some subserosal and pedunculated. Individual fibroids up to 2 cm. Some calcified. Other: Streak artifact. But ascites in the lower abdomen and pelvis now appears mildly complex, as opposed to simple fluid density 2 days ago. Volume of fluid is fairly small. Musculoskeletal: No acute osseous abnormality identified. Anasarca. IMPRESSION: VASCULAR 1. No contrast extravasation identified within the bowel lumen. 2. Normal abdominal and pelvic arteries with minimal atherosclerosis. 3. Mass effect on the intrahepatic portal veins and hepatic veins related to abnormal liver. NON-VASCULAR 1. Anasarca, with stable small layering pleural effusions. Small volume ascites which now has mildly complex fluid density - new from two days ago. Consider peritonitis. Hemoperitoneum felt less likely. 2. Severe abnormal Liver, unchanged. Gallbladder Hydrops (200 mL gallbladder). 3. Delayed bilateral nephrograms suggesting acute intrinsic renal disease / acute renal insufficiency. No obstructive uropathy. 4. Indistinct pancreas stable since 10/09/2021. Difficult to exclude Acute Pancreatitis. 5. Featureless large and small bowel loops with no bowel obstruction or discrete bowel inflammation identified. No free air. Enteric tube terminates in the distal stomach. Electronically Signed   By: Genevie Ann M.D.   On: 10/09/2021 05:28   US Abdomen Limited RUQ (LIVER/GB)  Result Date: 09/21/2021 CLINICAL DATA:  RIGHT-sided abdominal pain.  Jaundice EXAM: ULTRASOUND ABDOMEN LIMITED RIGHT UPPER QUADRANT COMPARISON:  CT 07/25/2021, ultrasound 07/24/2021 FINDINGS: Gallbladder: Sludge within the gallbladder. The gallbladder wall is thickened. Positive sonographic Murphy's sign. Common bile duct: Diameter: Normal at 4 mm Liver: Increased echogenicity. Portal vein is patent on color Doppler imaging with normal direction of blood flow towards the liver. Other: None. IMPRESSION: Pericholecystic  fluid, gallbladder wall thickening and positive sonographic Murphy's sign are concerning for acute cholecystitis. Electronically Signed   By: Suzy Bouchard M.D.   On: 09/20/2021 18:07    Microbiology Recent Results (from the past 240 hour(s))  MRSA Next Gen by PCR, Nasal     Status: None   Collection Time: 10/21/2021  2:56 AM   Specimen: Nasal Mucosa; Nasal Swab  Result Value Ref Range Status   MRSA by PCR Next Gen NOT DETECTED NOT DETECTED Final    Comment: (NOTE) The GeneXpert MRSA Assay (FDA approved for NASAL specimens only), is one component of a comprehensive MRSA colonization surveillance program. It is not intended to diagnose MRSA infection nor to guide or monitor treatment for MRSA infections. Test performance is not FDA approved in patients less than 66 years old. Performed at [facility], [place] [address] [place], [place] [address]   Culture, blood (routine x 2)     Status: None (  Preliminary result)   Collection Time: 10/19/2021  6:44 AM   Specimen: BLOOD LEFT HAND  Result Value Ref Range Status   Specimen Description BLOOD LEFT HAND  Final   Special Requests   Final    BOTTLES DRAWN AEROBIC AND ANAEROBIC Blood Culture adequate volume   Culture   Final    NO GROWTH 3 DAYS Performed at [facility], [address] [place], [place] [address]    Report Status PENDING  Incomplete  Culture, blood (routine x 2)     Status: None (Preliminary result)   Collection Time: 10/16/2021  7:04 AM   Specimen: BLOOD LEFT WRIST  Result Value Ref Range Status   Specimen Description BLOOD LEFT WRIST  Final   Special Requests   Final    BOTTLES DRAWN AEROBIC AND ANAEROBIC Blood Culture results may not be optimal due to an inadequate volume of blood received in culture bottles   Culture   Final    NO GROWTH 3 DAYS Performed at [facility], [place] [address] [place], [place] [address]    Report Status PENDING  Incomplete  Aerobic/Anaerobic Culture w Gram Stain  (surgical/deep wound)     Status: None (Preliminary result)   Collection Time: 10/12/21  5:17 PM   Specimen: PATH Gallbladder; Tissue  Result Value Ref Range Status   Specimen Description FLUID  Final   Special Requests GALLBLADDER  Final   Gram Stain   Final    FEW WBC PRESENT,BOTH PMN AND MONONUCLEAR RARE YEAST WITH PSEUDOHYPHAE Performed at [facility], [place] [address], [place], [place] [address]    Culture   Final    FEW CANDIDA ALBICANS NO ANAEROBES ISOLATED; CULTURE IN PROGRESS FOR 5 DAYS    Report Status PENDING  Incomplete    Lab Basic Metabolic Panel: Recent Labs  Lab 10/10/2021 0508 10/12/21 0426 10/12/21 1055 10/12/21 1549 2021-10-28 0224 10/28/21 0939 10-28-2021 1232 2021-10-28 1507  NA 130* 131*  --  130* 131* 135 134* 135  K 3.3* 3.2*   < > 3.6 3.3* 3.5 3.6 3.4*  CL 100 101  --  98 100  --  103  --   CO2 15* 17*  --  18* 16*  --  14*  --   GLUCOSE 107* 97  --  154* 91  --  114*  --   BUN 28* 32*  --  34* 33*  --  35*  --   CREATININE 2.33* 2.64*  --  2.76* 2.79*  --  2.88*  --   CALCIUM 6.2* 6.4*  --  6.6* 6.7*  --  6.8*  --   MG 1.8 2.0  --   --   --   --   --   --   PHOS 2.4*  --   --   --   --   --   --   --    < > = values in this interval not displayed.   Liver Function Tests: Recent Labs  Lab 10/08/21 0646 10/09/21 0131 10/10/21 0824 10/24/2021 0508 10/28/2021 0224  AST 387* 406* 355* 241* 323*  ALT 107* 110* 92* 59* 77*  ALKPHOS 152* 206* 189* 119 142*  BILITOT 33.8* 34.1* 37.7* 24.7* 30.0*  PROT 5.7* 6.0* 5.7* 4.2* 5.7*  ALBUMIN 1.9* 2.0* 2.7* 2.3* 3.2*   No results for input(s): LIPASE, AMYLASE in the last 168 hours. No results for input(s): AMMONIA in the last 168 hours. CBC: Recent Labs  Lab 10/10/21 2358 10/10/2021 0420 10/29/2021 1530 11/01/2021 2041 10/12/21 0426 10/12/21 1549 11-09-2021 0224 11/09/21 0939 November 09, 2021 1232 Nov 09, 2021 1507  WBC 31.9*   < > 40.3* 42.6* 39.0* 39.1* 34.0*  --   --   --   NEUTROABS 23.0*  --   --   --   --    --   --   --   --   --   HGB 9.3*   < > 7.8* 7.5* 7.0* 6.0* 7.2* 7.1* 6.2* 5.1*  HCT 26.5*   < > 21.2* 21.1* 19.2* 16.9* 21.2* 21.0* 17.7* 15.0*  MCV 96.0   < > 88.3 89.8 89.3 91.4 92.2  --   --   --   PLT 148*   < > 141* 149* 127* 127* 120*  --   --   --    < > = values in this interval not displayed.   Cardiac Enzymes: No results for input(s): CKTOTAL, CKMB, CKMBINDEX, TROPONINI in the last 168 hours. Sepsis Labs: Recent Labs  Lab 10/08/21 0646 10/09/21 0131 10/30/2021 0508 10/26/2021 0858 11/05/2021 2041 10/12/21 0426 10/12/21 1549 10/12/21 1751 11/09/21 0224  WBC 30.8*   < > 33.5*   < > 42.6* 39.0* 39.1*  --  34.0*  LATICACIDVEN 2.4*  --  4.3*  --   --   --   --  2.2* 2.9*   < > = values in this interval not displayed.    Procedures/Operations   Flex sig EGD Perc cholecystoscopy tube placement Intubation CVC- R&L femoral Arterial line insertion   Julian Hy 10/14/2021, 8:46 PM

## 2021-11-07 NOTE — Progress Notes (Addendum)
Patient TOD 1844, pronounced by 2 RNs. Comfort care provided to patient and emotional support provided to family at bedside.  ?

## 2021-11-07 NOTE — Procedures (Signed)
Intubation Procedure Note ? ?Judy Meyer  ?[id number]  ?[birth year] ? ?Date:20-Oct-2021  ?Time:1:56 PM  ? ?Provider Performing:Sorrel Cassetta Naomie Dean  ? ? ?Procedure: Intubation (00379) ? ?Indication(s) ?Respiratory Failure ? ?Consent ?Risks of the procedure as well as the alternatives and risks of each were explained to the patient and/or caregiver.  Consent for the procedure was obtained and is signed in the bedside chart ? ? ?Anesthesia ?Etomidate and Rocuronium ? ? ?Time Out ?Verified patient identification, verified procedure, site/side was marked, verified correct patient position, special equipment/implants available, medications/allergies/relevant history reviewed, required imaging and test results available. ? ? ?Sterile Technique ?Usual hand hygeine, masks, and gloves were used ? ? ?Procedure Description ?NGT placed prior to sedation or supine positioning. BRB suctioned from tube upon placement.  ?Patient positioned in bed supine.  Sedation given as noted above.  Patient was intubated with endotracheal tube using  MAC 3 .  View was Grade 1 full glottis  after suctioning out bloody secretions. Crich pressure was used.  Number of attempts was 1.  Colorimetric CO2 detector was consistent with tracheal placement. ? ? ?Complications/Tolerance ?None; patient tolerated the procedure well. ?Chest X-ray is ordered to verify placement. ? ? ?EBL ?Minimal ? ? ?Specimen(s) ?None ? ?Julian Hy, DO 10-20-2021 1:57 PM ?[facility] ? ?

## 2021-11-07 DEATH — deceased

## 2021-11-11 LAB — FUNGUS CULTURE WITH STAIN

## 2021-11-11 LAB — FUNGAL ORGANISM REFLEX

## 2021-11-11 LAB — FUNGUS CULTURE RESULT
